# Patient Record
Sex: Male | Born: 1945
Health system: Southern US, Community
[De-identification: ages and names within clinical notes are randomized; demographics above are authoritative.]

## PROBLEM LIST (undated history)

## (undated) DIAGNOSIS — I10 Essential (primary) hypertension: Secondary | ICD-10-CM

## (undated) DIAGNOSIS — R7309 Other abnormal glucose: Secondary | ICD-10-CM

## (undated) DIAGNOSIS — K859 Acute pancreatitis without necrosis or infection, unspecified: Secondary | ICD-10-CM

## (undated) DIAGNOSIS — E785 Hyperlipidemia, unspecified: Secondary | ICD-10-CM

## (undated) DIAGNOSIS — R011 Cardiac murmur, unspecified: Secondary | ICD-10-CM

## (undated) DIAGNOSIS — J449 Chronic obstructive pulmonary disease, unspecified: Secondary | ICD-10-CM

## (undated) DIAGNOSIS — C3490 Malignant neoplasm of unspecified part of unspecified bronchus or lung: Secondary | ICD-10-CM

## (undated) DIAGNOSIS — N486 Induration penis plastica: Secondary | ICD-10-CM

## (undated) DIAGNOSIS — C801 Malignant (primary) neoplasm, unspecified: Secondary | ICD-10-CM

## (undated) DIAGNOSIS — I509 Heart failure, unspecified: Secondary | ICD-10-CM

## (undated) DIAGNOSIS — E230 Hypopituitarism: Secondary | ICD-10-CM

## (undated) DIAGNOSIS — B029 Zoster without complications: Secondary | ICD-10-CM

## (undated) DIAGNOSIS — M8580 Other specified disorders of bone density and structure, unspecified site: Secondary | ICD-10-CM

## (undated) DIAGNOSIS — I2510 Atherosclerotic heart disease of native coronary artery without angina pectoris: Secondary | ICD-10-CM

## (undated) DIAGNOSIS — K219 Gastro-esophageal reflux disease without esophagitis: Secondary | ICD-10-CM

## (undated) DIAGNOSIS — M1990 Unspecified osteoarthritis, unspecified site: Secondary | ICD-10-CM

## (undated) DIAGNOSIS — Z923 Personal history of irradiation: Secondary | ICD-10-CM

## (undated) DIAGNOSIS — E349 Endocrine disorder, unspecified: Secondary | ICD-10-CM

## (undated) HISTORY — DX: Cardiac murmur, unspecified: R01.1

## (undated) HISTORY — DX: Heart failure, unspecified: I50.9

## (undated) HISTORY — DX: Endocrine disorder, unspecified: E34.9

## (undated) HISTORY — DX: Hyperlipidemia, unspecified: E78.5

## (undated) HISTORY — DX: Malignant (primary) neoplasm, unspecified: C80.1

## (undated) HISTORY — PX: PORTA CATH INSERTION: CATH118285

## (undated) HISTORY — DX: Other abnormal glucose: R73.09

## (undated) HISTORY — PX: VASECTOMY: SHX75

## (undated) HISTORY — DX: Unspecified osteoarthritis, unspecified site: M19.90

## (undated) HISTORY — DX: Atherosclerotic heart disease of native coronary artery without angina pectoris: I25.10

## (undated) HISTORY — DX: Hypopituitarism: E23.0

## (undated) HISTORY — PX: KNEE SURGERY: SHX244

## (undated) HISTORY — DX: Gastro-esophageal reflux disease without esophagitis: K21.9

## (undated) HISTORY — DX: Malignant neoplasm of unspecified part of unspecified bronchus or lung: C34.90

## (undated) HISTORY — DX: Induration penis plastica: N48.6

## (undated) HISTORY — DX: Essential (primary) hypertension: I10

## (undated) HISTORY — DX: Other specified disorders of bone density and structure, unspecified site: M85.80

## (undated) HISTORY — DX: Acute pancreatitis without necrosis or infection, unspecified: K85.90

## (undated) HISTORY — DX: Chronic obstructive pulmonary disease, unspecified: J44.9

---

## 1965-01-07 HISTORY — PX: OTHER SURGICAL HISTORY: SHX169

## 1967-01-08 HISTORY — PX: OTHER SURGICAL HISTORY: SHX169

## 1982-01-07 HISTORY — PX: HERNIA REPAIR: SHX51

## 1992-01-08 HISTORY — PX: SHOULDER SURGERY: SHX246

## 1998-08-16 ENCOUNTER — Encounter: Payer: Self-pay | Admitting: Emergency Medicine

## 1998-08-16 ENCOUNTER — Inpatient Hospital Stay (HOSPITAL_COMMUNITY): Admission: EM | Admit: 1998-08-16 | Discharge: 1998-08-22 | Payer: Self-pay | Admitting: Emergency Medicine

## 1998-08-17 ENCOUNTER — Encounter: Payer: Self-pay | Admitting: Cardiology

## 1998-08-18 ENCOUNTER — Encounter: Payer: Self-pay | Admitting: Thoracic Surgery (Cardiothoracic Vascular Surgery)

## 1998-08-19 ENCOUNTER — Encounter: Payer: Self-pay | Admitting: Thoracic Surgery (Cardiothoracic Vascular Surgery)

## 1998-08-20 ENCOUNTER — Encounter: Payer: Self-pay | Admitting: Thoracic Surgery (Cardiothoracic Vascular Surgery)

## 1998-08-21 ENCOUNTER — Encounter: Payer: Self-pay | Admitting: Thoracic Surgery (Cardiothoracic Vascular Surgery)

## 1998-08-22 ENCOUNTER — Encounter: Payer: Self-pay | Admitting: Thoracic Surgery (Cardiothoracic Vascular Surgery)

## 1999-01-08 HISTORY — PX: CORONARY ARTERY BYPASS GRAFT: SHX141

## 2002-06-18 ENCOUNTER — Emergency Department (HOSPITAL_COMMUNITY): Admission: EM | Admit: 2002-06-18 | Discharge: 2002-06-19 | Payer: Self-pay | Admitting: *Deleted

## 2002-06-19 ENCOUNTER — Encounter: Payer: Self-pay | Admitting: *Deleted

## 2003-04-15 ENCOUNTER — Encounter: Payer: Self-pay | Admitting: Internal Medicine

## 2004-01-08 HISTORY — PX: OTHER SURGICAL HISTORY: SHX169

## 2004-02-13 ENCOUNTER — Ambulatory Visit (HOSPITAL_COMMUNITY): Admission: RE | Admit: 2004-02-13 | Discharge: 2004-02-13 | Payer: Self-pay | Admitting: Podiatry

## 2004-04-27 ENCOUNTER — Ambulatory Visit: Payer: Self-pay | Admitting: Internal Medicine

## 2005-01-07 HISTORY — PX: COLONOSCOPY: SHX174

## 2005-01-07 HISTORY — PX: MENISECTOMY: SHX5181

## 2005-01-07 HISTORY — PX: UPPER GASTROINTESTINAL ENDOSCOPY: SHX188

## 2005-05-03 ENCOUNTER — Ambulatory Visit: Payer: Self-pay | Admitting: Internal Medicine

## 2005-06-06 ENCOUNTER — Ambulatory Visit: Payer: Self-pay | Admitting: Internal Medicine

## 2005-07-04 ENCOUNTER — Ambulatory Visit: Payer: Self-pay | Admitting: Cardiology

## 2005-07-12 ENCOUNTER — Ambulatory Visit: Payer: Self-pay | Admitting: Gastroenterology

## 2005-07-29 ENCOUNTER — Encounter: Payer: Self-pay | Admitting: Gastroenterology

## 2005-07-29 ENCOUNTER — Ambulatory Visit: Payer: Self-pay | Admitting: Gastroenterology

## 2006-05-05 ENCOUNTER — Ambulatory Visit: Payer: Self-pay | Admitting: Internal Medicine

## 2006-05-05 LAB — CONVERTED CEMR LAB
ALT: 33 units/L (ref 0–40)
AST: 36 units/L (ref 0–37)
Albumin: 3.8 g/dL (ref 3.5–5.2)
Alkaline Phosphatase: 64 units/L (ref 39–117)
BUN: 10 mg/dL (ref 6–23)
Basophils Absolute: 0 10*3/uL (ref 0.0–0.1)
Basophils Relative: 0.5 % (ref 0.0–1.0)
Bilirubin, Direct: 0.1 mg/dL (ref 0.0–0.3)
CO2: 31 meq/L (ref 19–32)
Calcium: 8.8 mg/dL (ref 8.4–10.5)
Chloride: 108 meq/L (ref 96–112)
Cholesterol: 179 mg/dL (ref 0–200)
Creatinine, Ser: 0.9 mg/dL (ref 0.4–1.5)
Eosinophils Absolute: 0.1 10*3/uL (ref 0.0–0.6)
Eosinophils Relative: 2 % (ref 0.0–5.0)
GFR calc Af Amer: 111 mL/min
GFR calc non Af Amer: 91 mL/min
Glucose, Bld: 107 mg/dL — ABNORMAL HIGH (ref 70–99)
HCT: 50.3 % (ref 39.0–52.0)
HDL: 36.8 mg/dL — ABNORMAL LOW (ref >39.0)
Hemoglobin: 17.3 g/dL — ABNORMAL HIGH (ref 13.0–17.0)
Hgb A1c MFr Bld: 5.7 % (ref 4.6–6.0)
LDL Cholesterol: 129 mg/dL — ABNORMAL HIGH (ref 0–99)
Lymphocytes Relative: 33.4 % (ref 12.0–46.0)
MCHC: 34.4 g/dL (ref 30.0–36.0)
MCV: 96.4 fL (ref 78.0–100.0)
Monocytes Absolute: 0.6 10*3/uL (ref 0.2–0.7)
Monocytes Relative: 8.6 % (ref 3.0–11.0)
Neutro Abs: 3.6 10*3/uL (ref 1.4–7.7)
Neutrophils Relative %: 55.5 % (ref 43.0–77.0)
PSA: 1.4 ng/mL (ref 0.10–4.00)
Platelets: 179 10*3/uL (ref 150–400)
Potassium: 4.4 meq/L (ref 3.5–5.1)
RBC: 5.22 M/uL (ref 4.22–5.81)
RDW: 12.9 % (ref 11.5–14.6)
Sodium: 141 meq/L (ref 135–145)
TSH: 1.59 microintl units/mL (ref 0.35–5.50)
Total Bilirubin: 0.7 mg/dL (ref 0.3–1.2)
Total CHOL/HDL Ratio: 4.9
Total Protein: 6.7 g/dL (ref 6.0–8.3)
Triglycerides: 64 mg/dL (ref 0–149)
VLDL: 13 mg/dL (ref 0–40)
WBC: 6.4 10*3/uL (ref 4.5–10.5)

## 2006-11-11 ENCOUNTER — Encounter: Payer: Self-pay | Admitting: Internal Medicine

## 2007-01-08 ENCOUNTER — Emergency Department (HOSPITAL_COMMUNITY): Admission: EM | Admit: 2007-01-08 | Discharge: 2007-01-08 | Payer: Self-pay | Admitting: Emergency Medicine

## 2007-07-28 DIAGNOSIS — R030 Elevated blood-pressure reading, without diagnosis of hypertension: Secondary | ICD-10-CM | POA: Insufficient documentation

## 2007-08-05 ENCOUNTER — Ambulatory Visit: Payer: Self-pay | Admitting: Internal Medicine

## 2007-08-05 ENCOUNTER — Encounter (INDEPENDENT_AMBULATORY_CARE_PROVIDER_SITE_OTHER): Payer: Self-pay | Admitting: *Deleted

## 2007-08-05 DIAGNOSIS — N400 Enlarged prostate without lower urinary tract symptoms: Secondary | ICD-10-CM | POA: Insufficient documentation

## 2007-08-05 DIAGNOSIS — E349 Endocrine disorder, unspecified: Secondary | ICD-10-CM | POA: Insufficient documentation

## 2007-08-05 DIAGNOSIS — K219 Gastro-esophageal reflux disease without esophagitis: Secondary | ICD-10-CM | POA: Insufficient documentation

## 2007-08-05 DIAGNOSIS — E230 Hypopituitarism: Secondary | ICD-10-CM | POA: Insufficient documentation

## 2007-08-05 DIAGNOSIS — I2510 Atherosclerotic heart disease of native coronary artery without angina pectoris: Secondary | ICD-10-CM | POA: Insufficient documentation

## 2007-08-05 LAB — CONVERTED CEMR LAB
Bilirubin Urine: NEGATIVE
Blood in Urine, dipstick: NEGATIVE
Glucose, Urine, Semiquant: NEGATIVE
Ketones, urine, test strip: NEGATIVE
Specific Gravity, Urine: 1.01
pH: 7

## 2007-08-10 ENCOUNTER — Telehealth (INDEPENDENT_AMBULATORY_CARE_PROVIDER_SITE_OTHER): Payer: Self-pay | Admitting: *Deleted

## 2007-08-10 ENCOUNTER — Encounter (INDEPENDENT_AMBULATORY_CARE_PROVIDER_SITE_OTHER): Payer: Self-pay | Admitting: *Deleted

## 2007-08-10 LAB — CONVERTED CEMR LAB
Albumin: 3.9 g/dL (ref 3.5–5.2)
Bilirubin, Direct: 0.1 mg/dL (ref 0.0–0.3)
Calcium: 9.1 mg/dL (ref 8.4–10.5)
Cholesterol: 183 mg/dL (ref 0–200)
Eosinophils Absolute: 0.1 10*3/uL (ref 0.0–0.7)
GFR calc Af Amer: 110 mL/min
GFR calc non Af Amer: 91 mL/min
Glucose, Bld: 100 mg/dL — ABNORMAL HIGH (ref 70–99)
HCT: 51.2 % (ref 39.0–52.0)
HDL: 36.3 mg/dL — ABNORMAL LOW (ref >39.0)
Hemoglobin: 18 g/dL — ABNORMAL HIGH (ref 13.0–17.0)
MCHC: 35.1 g/dL (ref 30.0–36.0)
MCV: 95.4 fL (ref 78.0–100.0)
Monocytes Absolute: 0.7 10*3/uL (ref 0.1–1.0)
Monocytes Relative: 9.8 % (ref 3.0–12.0)
Neutro Abs: 4.5 10*3/uL (ref 1.4–7.7)
PSA: 1.26 ng/mL (ref 0.10–4.00)
RDW: 13.3 % (ref 11.5–14.6)
Sodium: 137 meq/L (ref 135–145)
TSH: 1.47 microintl units/mL (ref 0.35–5.50)
Total CHOL/HDL Ratio: 5
Total Protein: 7.1 g/dL (ref 6.0–8.3)
Triglycerides: 72 mg/dL (ref 0–149)

## 2007-08-11 ENCOUNTER — Ambulatory Visit: Payer: Self-pay | Admitting: Internal Medicine

## 2007-08-11 ENCOUNTER — Ambulatory Visit: Payer: Self-pay

## 2007-08-11 ENCOUNTER — Encounter (INDEPENDENT_AMBULATORY_CARE_PROVIDER_SITE_OTHER): Payer: Self-pay | Admitting: *Deleted

## 2007-08-12 ENCOUNTER — Ambulatory Visit: Payer: Self-pay | Admitting: Internal Medicine

## 2007-08-12 DIAGNOSIS — E785 Hyperlipidemia, unspecified: Secondary | ICD-10-CM | POA: Insufficient documentation

## 2007-08-12 LAB — CONVERTED CEMR LAB: HDL goal, serum: 40 mg/dL

## 2007-08-14 ENCOUNTER — Encounter (INDEPENDENT_AMBULATORY_CARE_PROVIDER_SITE_OTHER): Payer: Self-pay | Admitting: *Deleted

## 2007-09-04 ENCOUNTER — Encounter: Payer: Self-pay | Admitting: Internal Medicine

## 2007-09-30 ENCOUNTER — Encounter: Payer: Self-pay | Admitting: Internal Medicine

## 2007-10-14 ENCOUNTER — Encounter (INDEPENDENT_AMBULATORY_CARE_PROVIDER_SITE_OTHER): Payer: Self-pay | Admitting: *Deleted

## 2008-02-10 ENCOUNTER — Ambulatory Visit: Payer: Self-pay | Admitting: Internal Medicine

## 2008-02-10 DIAGNOSIS — D582 Other hemoglobinopathies: Secondary | ICD-10-CM | POA: Insufficient documentation

## 2008-02-11 ENCOUNTER — Encounter: Payer: Self-pay | Admitting: Internal Medicine

## 2008-02-11 LAB — CONVERTED CEMR LAB
Albumin: 4 g/dL (ref 3.5–5.2)
Alkaline Phosphatase: 60 units/L (ref 39–117)
Basophils Absolute: 0 10*3/uL (ref 0.0–0.1)
Bilirubin, Direct: 0.1 mg/dL (ref 0.0–0.3)
Cholesterol: 172 mg/dL (ref 0–200)
Eosinophils Absolute: 0.1 10*3/uL (ref 0.0–0.7)
HDL: 32.6 mg/dL — ABNORMAL LOW (ref >39.0)
MCHC: 34.2 g/dL (ref 30.0–36.0)
MCV: 97.1 fL (ref 78.0–100.0)
Neutrophils Relative %: 61 % (ref 43.0–77.0)
Platelets: 168 10*3/uL (ref 150–400)
RDW: 13 % (ref 11.5–14.6)
Saturation Ratios: 52.7 % — ABNORMAL HIGH (ref 20.0–50.0)
Total CK: 127 units/L (ref 7–195)
VLDL: 20 mg/dL (ref 0–40)

## 2008-08-05 ENCOUNTER — Ambulatory Visit: Payer: Self-pay | Admitting: Internal Medicine

## 2008-08-12 ENCOUNTER — Encounter (INDEPENDENT_AMBULATORY_CARE_PROVIDER_SITE_OTHER): Payer: Self-pay | Admitting: *Deleted

## 2008-08-12 LAB — CONVERTED CEMR LAB
Albumin: 4.1 g/dL (ref 3.5–5.2)
Alkaline Phosphatase: 66 units/L (ref 39–117)
Basophils Absolute: 0 10*3/uL (ref 0.0–0.1)
CO2: 29 meq/L (ref 19–32)
Calcium: 9.2 mg/dL (ref 8.4–10.5)
Eosinophils Absolute: 0.1 10*3/uL (ref 0.0–0.7)
Glucose, Bld: 97 mg/dL (ref 70–99)
HDL: 39 mg/dL — ABNORMAL LOW (ref >39.00)
Hemoglobin: 17.9 g/dL — ABNORMAL HIGH (ref 13.0–17.0)
Lymphocytes Relative: 30.6 % (ref 12.0–46.0)
Lymphs Abs: 1.8 10*3/uL (ref 0.7–4.0)
MCHC: 34.3 g/dL (ref 30.0–36.0)
Monocytes Absolute: 0.6 10*3/uL (ref 0.1–1.0)
Neutro Abs: 3.3 10*3/uL (ref 1.4–7.7)
PSA: 1.32 ng/mL (ref 0.10–4.00)
RDW: 12.9 % (ref 11.5–14.6)
Sodium: 142 meq/L (ref 135–145)
TSH: 1.24 microintl units/mL (ref 0.35–5.50)
Triglycerides: 69 mg/dL (ref 0.0–149.0)

## 2008-10-26 ENCOUNTER — Encounter: Payer: Self-pay | Admitting: Internal Medicine

## 2009-01-26 ENCOUNTER — Telehealth (INDEPENDENT_AMBULATORY_CARE_PROVIDER_SITE_OTHER): Payer: Self-pay | Admitting: *Deleted

## 2009-07-31 ENCOUNTER — Ambulatory Visit: Payer: Self-pay | Admitting: Internal Medicine

## 2009-07-31 LAB — CONVERTED CEMR LAB
ALT: 18 units/L (ref 0–53)
Alkaline Phosphatase: 63 units/L (ref 39–117)
BUN: 11 mg/dL (ref 6–23)
Basophils Absolute: 0 10*3/uL (ref 0.0–0.1)
Bilirubin Urine: NEGATIVE
Bilirubin, Direct: 0.2 mg/dL (ref 0.0–0.3)
Calcium: 8.7 mg/dL (ref 8.4–10.5)
Cholesterol: 130 mg/dL (ref 0–200)
Eosinophils Absolute: 0.1 10*3/uL (ref 0.0–0.7)
GFR calc non Af Amer: 104.94 mL/min (ref >60)
Glucose, Bld: 90 mg/dL (ref 70–99)
Glucose, Urine, Semiquant: NEGATIVE
Hemoglobin: 17.6 g/dL — ABNORMAL HIGH (ref 13.0–17.0)
Lymphocytes Relative: 26.3 % (ref 12.0–46.0)
MCHC: 34.9 g/dL (ref 30.0–36.0)
MCV: 99.1 fL (ref 78.0–100.0)
Monocytes Absolute: 0.6 10*3/uL (ref 0.1–1.0)
Neutro Abs: 4.2 10*3/uL (ref 1.4–7.7)
Neutrophils Relative %: 63.1 % (ref 43.0–77.0)
Potassium: 4.2 meq/L (ref 3.5–5.1)
RDW: 14.4 % (ref 11.5–14.6)
Total Bilirubin: 0.6 mg/dL (ref 0.3–1.2)
Total Protein: 6.5 g/dL (ref 6.0–8.3)
pH: 8

## 2009-08-07 ENCOUNTER — Ambulatory Visit: Payer: Self-pay | Admitting: Internal Medicine

## 2009-08-07 DIAGNOSIS — M8580 Other specified disorders of bone density and structure, unspecified site: Secondary | ICD-10-CM | POA: Insufficient documentation

## 2009-08-07 LAB — CONVERTED CEMR LAB: OCCULT 1: NEGATIVE

## 2009-08-08 ENCOUNTER — Encounter (INDEPENDENT_AMBULATORY_CARE_PROVIDER_SITE_OTHER): Payer: Self-pay | Admitting: *Deleted

## 2009-09-12 ENCOUNTER — Encounter: Payer: Self-pay | Admitting: Internal Medicine

## 2009-09-14 ENCOUNTER — Encounter: Payer: Self-pay | Admitting: Internal Medicine

## 2010-02-06 NOTE — Assessment & Plan Note (Signed)
Summary: cpx/ns/kdc   Vital Signs:  Patient profile:   65 year old male Height:      72 inches Weight:      192.6 pounds BMI:     26.22 Temp:     97.9 degrees F oral Pulse rate:   60 / minute Resp:     16 per minute BP sitting:   132 / 80  (left arm) Cuff size:   large  Vitals Entered By: Shonna Chock CMA (August 07, 2009 10:59 AM)    History of Present Illness: Patrick Harris is here for a physical; he is asymptomatic except for some muscle cramps in context of sweating during the recent  heat wave.  He had Life Screen testing done 06/01/2009; those results were reviewed . His heel T score was -1.4.  Current Medications (verified): 1)  Amlodipine Besylate 5 Mg  Tabs (Amlodipine Besylate) .... Take One Tablet Daily. 2)  Niaspan 750 Mg Cr-Tabs (Niacin (Antihyperlipidemic)) .... 2 By Mouth Once Daily 3)  Flomax 0.4 Mg  Xr24h-Cap (Tamsulosin Hcl) .Marland Kitchen.. 1 By Mouth Once Daily 4)  Androgel 50 Mg/5gm  Gel (Testosterone) .... 1/2 Daily 5)  Bromocriptine Mesylate 5 Mg  Caps (Bromocriptine Mesylate) .Marland Kitchen.. 1 By Mouth Once Daily 6)  Aspirin Buffered 325 Mg  Tabs (Aspirin Buff(Mgcarb-Alaminoac)) .Marland Kitchen.. 1 By Mouth Once Daily 7)  Coq10 100 Mg  Caps (Coenzyme Q10) .Marland Kitchen.. 1 By Mouth Once Daily 8)  Flax Seed Oil 1000 Mg  Caps (Flaxseed (Linseed)) .Marland Kitchen.. 1 By Mouth Once Daily 9)  Vitamin D3 1000 Unit  Tabs (Cholecalciferol) .Marland Kitchen.. 1 By Mouth Once Daily 10)  Crestor 20 Mg Tabs (Rosuvastatin Calcium) .... Daily As Directed 11)  Prilosec Otc 20 Mg Tbec (Omeprazole Magnesium) .Marland Kitchen.. 1 By Mouth Once Daily 12)  Potassium 99 Mg Tabs (Potassium) .Marland Kitchen.. 1 By Mouth Once Daily 13)  Turmeric Curcumin  Caps (Misc Natural Products) .... 2 By Mouth Once Daily 14)  L-Arginine 500 Mg Tabs (Arginine) .Marland Kitchen.. 1 By Mouth Once Daily  Allergies: 1)  ! Zocor (Simvastatin) 2)  ! * Latex  Past History:  Past Medical History: Hypertension 06/22/99-Hypopituitary gland  hypofunction , Dr Adela Lank Testoterone deficiency ;  Peyronie's Hyperlipidemia Elevated Hgb (on testosterone replacement) GERD Osteopenia: T score -1.4 @ heel 2011  Past Surgical History: age 49-fractured calcaneus  bilaterally with ankle fusion Herniorrhaphy 1984 Left shoulder surgery 1981 2001-bypass surgery 2 vessels Negative colonoscopy 2002 & 2007 2006-bone spur of 5th toes 2007- R  medial menisectomy, Dr Ophelia Charter 2007 Endoscopy:hiatal hernia, Dr Jarold Motto  Family History: Father: MI @ 84 Mother: DM,CVA,HTN Siblings: sister: leukemia;bro :AAA ; bro: Peyronie's; P uncles: ? colon cancer; P 1st cousins: colon cancer  Social History: no diet Retired Former Smoker: quit 2001 Alcohol use-no Regular exercise: physically active  & UE exercises  Review of Systems  The patient denies anorexia, fever, vision loss, decreased hearing, hoarseness, chest pain, syncope, dyspnea on exertion, prolonged cough, headaches, hemoptysis, abdominal pain, melena, hematochezia, severe indigestion/heartburn, hematuria, incontinence, suspicious skin lesions, depression, unusual weight change, abnormal bleeding, enlarged lymph nodes, and angioedema.         Weight up 6-8 #. Ankle edema with prolonged driving.Isolated , single episode of  R temporal headache with sharp pain for seconds after bending over in  shower; no recurrence of significance(see FOV & Neuro exam).  Physical Exam  General:  well-nourished,in no acute distress; alert,appropriate and cooperative throughout examination Head:  Normocephalic and atraumatic without obvious abnormalities. No apparent  alopecia  Eyes:  No corneal or conjunctival inflammation noted. EOMI. Perrla. Funduscopic exam benign, without hemorrhages, exudates or papilledema. Field of Vision grossly normal. Ears:  External ear exam shows no significant lesions or deformities.  Otoscopic examination reveals clear canals, tympanic membranes are intact bilaterally without bulging, retraction, inflammation or discharge. Hearing  is grossly normal bilaterally. Nose:  External nasal examination shows no deformity or inflammation. Nasal mucosa are pink and moist without lesions or exudates. Mouth:  Oral mucosa and oropharynx without lesions or exudates.  Upper plate Neck:  No deformities, masses, or tenderness noted. Lungs:  Normal respiratory effort, chest expands symmetrically. Lungs are clear to auscultation, no crackles or wheezes. Heart:  Normal rate and regular rhythm. S1  slightly increased.No  gallop, murmur, click, rub or other extra sounds. Abdomen:  Bowel sounds positive,abdomen soft and non-tender without masses, organomegaly or hernias noted. No AAA Rectal:  No external abnormalities noted. Normal sphincter tone. No rectal masses or tenderness. Genitalia:  Testes bilaterally descended without nodularity, tenderness or masses. No scrotal masses or lesions. No penis lesions or urethral discharge. L varicocele.   Prostate:  no nodules, no induration, and R  mild asymmetrical enlargement.   Msk:  No deformity or scoliosis noted of thoracic or lumbar spine.   Pulses:  R and L carotid,radial,dorsalis pedis and posterior tibial pulses are full and equal bilaterally Extremities:  No clubbing, cyanosis, edema, or deformity noted with normal full range of motion of all joints.   Neurologic:  +alert & oriented X3, cranial nerves II-XII intact, strength normal in all extremities, gait normal, and DTRs symmetrical and  0-1/2+ Skin:  Intact without suspicious lesions or rashes Cervical Nodes:  No lymphadenopathy noted Axillary Nodes:  No palpable lymphadenopathy Inguinal Nodes:  No significant adenopathy Psych:  memory intact for recent and remote, normally interactive, good eye contact, and not anxious appearing.     Impression & Recommendations:  Problem # 1:  ROUTINE GENERAL MEDICAL EXAM@HEALTH  CARE FACL (ICD-V70.0)  Problem # 2:  OSTEOPENIA (ICD-733.90)  Problem # 3:  HYPERLIPIDEMIA (ICD-272.4)  The following  medications were removed from the medication list:    Niacin 500 Mg Tabs (Niacin) .Marland Kitchen... Tking 3 cap once daily His updated medication list for this problem includes:    Niaspan 750 Mg Cr-tabs (Niacin (antihyperlipidemic)) .Marland Kitchen... 2 by mouth once daily    Crestor 20 Mg Tabs (Rosuvastatin calcium) .Marland Kitchen... Daily as directed  Problem # 4:  HYPERPLASIA PROSTATE UNS W/O UR OBST & OTH LUTS (ICD-600.90)  His updated medication list for this problem includes:    Flomax 0.4 Mg Xr24h-cap (Tamsulosin hcl) .Marland Kitchen... 1 by mouth once daily  Problem # 5:  HYPERTENSION (ICD-401.9)  His updated medication list for this problem includes:    Amlodipine Besylate 5 Mg Tabs (Amlodipine besylate) .Marland Kitchen... Take one tablet daily.  Problem # 6:  PITUITARY INSUFFICIENCY (ICD-253.2) as per Dr Juleen China  Problem # 7:  TESTOSTERONE DEFICIENCY (ICD-257.2) as per Dr Juleen China  Complete Medication List: 1)  Amlodipine Besylate 5 Mg Tabs (Amlodipine besylate) .... Take one tablet daily. 2)  Niaspan 750 Mg Cr-tabs (Niacin (antihyperlipidemic)) .... 2 by mouth once daily 3)  Flomax 0.4 Mg Xr24h-cap (Tamsulosin hcl) .Marland Kitchen.. 1 by mouth once daily 4)  Androgel 50 Mg/5gm Gel (Testosterone) .... 1/2 daily 5)  Bromocriptine Mesylate 5 Mg Caps (Bromocriptine mesylate) .Marland Kitchen.. 1 by mouth once daily 6)  Aspirin Buffered 325 Mg Tabs (Aspirin buff(mgcarb-alaminoac)) .Marland Kitchen.. 1 by mouth once daily 7)  Coq10 100  Mg Caps (Coenzyme q10) .Marland Kitchen.. 1 by mouth once daily 8)  Flax Seed Oil 1000 Mg Caps (Flaxseed (linseed)) .Marland Kitchen.. 1 by mouth once daily 9)  Vitamin D3 1000 Unit Tabs (Cholecalciferol) .Marland Kitchen.. 1 by mouth once daily 10)  Crestor 20 Mg Tabs (Rosuvastatin calcium) .... Daily as directed 11)  Prilosec Otc 20 Mg Tbec (Omeprazole magnesium) .Marland Kitchen.. 1 by mouth once daily 12)  Potassium 99 Mg Tabs (Potassium) .Marland Kitchen.. 1 by mouth once daily 13)  Turmeric Curcumin Caps (Misc natural products) .... 2 by mouth once daily 14)  L-arginine 500 Mg Tabs (Arginine) .Marland Kitchen.. 1 by mouth once  daily  Patient Instructions: 1)  Verify when last BMD  done ; it is due every 25 months. Verify that Dr Juleen China checks the PSA due to Testosterone replacement. Prescriptions: CRESTOR 20 MG TABS (ROSUVASTATIN CALCIUM) daily as directed  #30 x 11   Entered and Authorized by:   Marga Melnick MD   Signed by:   Marga Melnick MD on 08/07/2009   Method used:   Print then Give to Patient   RxID:   [id number] AMLODIPINE BESYLATE 5 MG  TABS (AMLODIPINE BESYLATE) Take one tablet daily.  #90 x 3   Entered and Authorized by:   Marga Melnick MD   Signed by:   Marga Melnick MD on 08/07/2009   Method used:   Print then Give to Patient   RxID:   [id number] FLOMAX 0.4 MG  XR24H-CAP (TAMSULOSIN HCL) 1 by mouth once daily  #90 x 3   Entered and Authorized by:   Marga Melnick MD   Signed by:   Marga Melnick MD on 08/07/2009   Method used:   Print then Give to Patient   RxID:   [id number]

## 2010-02-06 NOTE — Miscellaneous (Signed)
Summary: [facility]  [facility]   Imported By: Lanelle Bal 09/27/2009 08:28:51  _____________________________________________________________________  External Attachment:    Type:   Image     Comment:   External Document

## 2010-02-06 NOTE — Progress Notes (Signed)
Summary: cpx labs  Phone Note Call from Patient   Caller: Patient Summary of Call: patient has a cpx scheduled on aug 1,2011 and labs scheduled on july 25,2011. need lab orders please. Initial call taken by: Barb Merino,  January 26, 2009 12:06 PM  Follow-up for Phone Call        401.9/272.4/995.20/v70.0 lipid hepatic cbcd tsh psa bmp udip stool cards Follow-up by: Shonna Chock,  January 26, 2009 1:43 PM    Additional Follow-up for Phone Call Additional follow up Details #2::    labs are scheduled.Marland KitchenMarland KitchenMarland KitchenBarb Merino  January 26, 2009 2:19 PM  Follow-up by: Barb Merino,  January 26, 2009 2:19 PM

## 2010-02-06 NOTE — Letter (Signed)
Summary: Results Follow up Letter  [facility]  [address] [place], [place] [address]   Phone: [phone]  Fax: [phone]    08/08/2009 MRN: [id number]  [facility] [address] [place], [place]  [address]  Dear Mr. MENO,  The following are the results of your recent test(s):  Test         Result    Pap Smear:        Normal _____  Not Normal _____ Comments: ______________________________________________________ Cholesterol: LDL(Bad cholesterol):         Your goal is less than:         HDL (Good cholesterol):       Your goal is more than: Comments:  ______________________________________________________ Mammogram:        Normal _____  Not Normal _____ Comments:  ___________________________________________________________________ Hemoccult:        Normal __X___  Not normal _______ Comments:    _____________________________________________________________________ Other Tests:    We routinely do not discuss normal results over the telephone.  If you desire a copy of the results, or you have any questions about this information we can discuss them at your next office visit.   Sincerely,

## 2010-05-25 NOTE — H&P (Signed)
NAME:  Patrick Harris, Patrick Harris             ACCOUNT NO.:  [id number]   MEDICAL RECORD NO.:  [id number]           PATIENT TYPE:   LOCATION:                                 FACILITY:   PHYSICIAN:  Oley Balm. Pricilla Holm, D.P.M.DATE OF BIRTH:  October 29, 1945   DATE OF ADMISSION:  02/13/2004  DATE OF DISCHARGE:  LH                                HISTORY & PHYSICAL   HISTORY OF PRESENT ILLNESS:  Mr. Rudin is a 65 year old male who presents  to the office with a chief complaint of pain fifth toes.  The patient has  basically abductor varus rotation both fifth toes with a hyperostosis.  The  patient relates that it has gotten so painful that he finds it difficult to  wearing closed shoes, and he requests surgical correction of same.   PAST SURGICAL HISTORY:  1.  Heart bypass surgery.  2.  Shoulder surgery.  3.  Previous foot surgery.   MEDICATIONS:  1.  Norvasc.  2.  Fish oil.  3.  __________ .  4.  AndroGel.  5.  Ecotrin.  6.  __________ .   ALLERGIES:  LATEX GLOVES.   No history of transfusions or hepatitis.   REVIEW OF SYSTEMS:  History of arthritis, hypertension.   PHYSICAL EXAMINATION:  LOWER EXTREMITIES:  Palpable pedal pulses both DP and  PT with spontaneous capillary filling time.  NEUROLOGIC:  Essentially within normal limits.  MUSCULOSKELETAL:  Reveals abductor-varus rotation, fifth toes of both feet,  with noted interdigital lesions.   PLAN:  The patient is to undergo arthroplasty _exostectomy_ fifth digits  bilaterally.  I reviewed the procedure with the patient, including  complications of the procedure such as infection, bone infection,  postoperative pain and swelling, etc.  The patient seems to understand the  same, and again surgery has been scheduled for February 13, 2004.      DBT/MEDQ  D:  02/12/2004  T:  02/12/2004  Job:  [id number]

## 2010-05-25 NOTE — Assessment & Plan Note (Signed)
[facility]                        [place] OFFICE NOTE   NAME:Patrick Harris, Patrick Harris                    MRN:          [id number]  DATE:05/05/2006                            DOB:          05/11/1945    Patrick Harris, date of birth [birth year], was seen for a  comprehensive physical examination May 05, 2006.   Active symptoms include some arthralgias; the symptoms were exacerbated  after 4 weeks of taking simvastatin prescribed by Dr. Dietrich Pates.  Dr.  Dietrich Pates had seen Joe for preoperative clearance prior to his knee  surgery in July 2007.  Because of symptoms he stopped the simvastatin  and restarted Niaspan.  He is presently on Niaspan 500 mg three daily.   PAST MEDICAL HISTORY:  Is long and complicated.  At age 65 he fractured  both feet and required ankle fusions following a fall off of a scaffold.  He has had a herniorrhaphy, left shoulder surgery, and a medial  meniscectomy.  Additionally, he has had colonoscopy which was negative  in 2007 and endoscopy which revealed hiatal hernia and esophageal  reflux.  He had 2 vessel bypass in 2001.  He has a history of benign  pituitary tumor which is associated with testosterone deficiency and for  which he is followed by Dr. Juleen China, Endocrinologist.   There is a family history of cancer of the colon in paternal uncles and  in paternal first cousins.  One also had breast cancer.  His mother had  late onset diabetes, stroke, and hypertension.  Father had myocardial  infarction at 65.  One brother had Peyronie's, another brother had  aortic aneurysm.  One sister had acute leukemia and one sister died in  an accident.   Joe has not smoked since 2001, does not drink.   HE IS INTOLERANT OR ALLERGIC TO LATEX AND AS NOTED, SIMVASTATIN.   Presently he is on:  1. Ecotrin 325 mg daily.  2. Amlodipine 5 mg daily.  3. Coenzyme Q10 thirty mg daily.  4. AndroGel 1% percent 2.5 grams.   5..  Saw  palmetto extract supplement.  1. Fish oil supplement.  2. In addition to the AndroGel Dr. Juleen China has him on Parlodel 5 mg      daily.  3. Was also on Prilosec 20 mg daily with good control of his      esophageal reflux.   REVIEW OF SYSTEMS:  Includes minor headache which he does not treat.  He  has no visual field cuts based on ophthalmologic evaluation .   Since the endoscopy Joe has described some increased gas.  He  specifically denies melena or dysphagia or other constitutional  symptoms.   He does have arthralgias as noted, rarely he will take an Advil.   His prolactin level was 9.1 within normal limits on March 24, 2006 at  Dr. Marylen Ponto office.   He had a decrease in his exercise due to his wife's recent diagnosis of  breast cancer.  He will walk 30 minutes 3 times a week w/o  cardiopulmonary symptoms.  He is on specific diet.  Significantly he  has  retired.   He is 6 feet and weighs 196, which is up 5 pounds.  Pulse was 60 and  regular, respiratory rate 12, blood pressure 110/80.  He has mild  arterial narrowing.  Extraocular motions intact.  No visual field  deficit is noted on examination. He has an upper dental plate.  There is  slight increased cerumen in the canals.  The remainder of  otolaryngologic exam is unremarkable.  There is no lymphadenopathy about  the head, neck, or axilla; thyroid is normal to palpation.  An S4 is  noted without murmurs.  CHEST:  Clear.  He has no organomegaly and there is no abdominal tenderness.  Bowel  sounds are normal.  He have mild crepitus of the knees.  Additionally there is some  asymmetry of the posterior thoracic muscle which probably explains back  pain he is having.  Deep tendon reflexes are 1-1/2 + and equal.  Strength is excellent in  all positions.  He has negative straight leg raising.  NEUROPSYCHIATRIC:  Unremarkable.  EKG reveals a sinus bradycardia with a rate of 45.  GENITOURINARY:  Unremarkable.  PROSTATE:  Upper  limits of normal without nodularity.  HEMOCCULT:  negative.   A goal sheet will be provided; fasting labs will be collected.  He has  been asked to share the results with Dr. Juleen China. In reviewing the chart  and based on an NMR profile, his LDL goal was less than 90.     Titus Dubin. Alwyn Ren, MD,FACP,FCCP  Electronically Signed    [facility]  DD: 05/05/2006  DT: 05/05/2006  Job #: [id number]

## 2010-05-25 NOTE — Op Note (Signed)
NAME:  Patrick Harris, Patrick Harris             ACCOUNT NO.:  [id number]   MEDICAL RECORD NO.:  [id number]          PATIENT TYPE:  AMB   LOCATION:  DAY                           FACILITY:  [facility]   PHYSICIAN:  Oley Balm. Pricilla Holm, D.P.M.DATE OF BIRTH:  Jul 19, 1945   DATE OF PROCEDURE:  02/13/2004  DATE OF DISCHARGE:                                 OPERATIVE REPORT   PREOPERATIVE DIAGNOSIS:  Abductovarus rotation, fifth toes, both feet, with  hyperostosis.   POSTOPERATIVE DIAGNOSIS:  Abductovarus rotation, fifth toes, both feet, with  hyperostosis.   PROCEDURES:  Arthroplasty, exostectomy, fifth toes, both feet.   SURGEON OF RECORD:  Oley Balm. Pricilla Holm, D.P.M.   ANESTHESIA:  Monitored anesthesia care.   INDICATIONS FOR SURGERY:  Long-standing history of pain not relieved by  conservative care.   DESCRIPTION OF PROCEDURE:  Patient brought into the operating room and  placed on the operating table in a supine position.  The patient's lower  feet and legs were then prepped and draped in the usual strict aseptic  manner.  Then with an ankle tourniquet placed, well-padded to elevate  contusion and elevated to 250 mmHg, exsanguination of each foot, the  following surgical procedures were then performed under monitored anesthesia  care.   ARTHROPLASTY, EXOSTECTOMY, FIFTH TOE, LEFT FOOT:  Attention was directed to  the medial aspect of the fifth toe, left foot, where a 2 cm elliptical  __________ skin incision was made.  The incision widened and deepened via  sharp and blunt dissection, being sure to identify and retract all vital  structures.  The distal end of the phalanx of the fifth toe was then exposed  and rasped smooth utilizing a Zimmer oscillating saw.  The wound was lavaged  with a copious amount of sterile saline and capsule and subcutaneous tissues  reapproximated with a continuous running suture of 4-0 Dexon.  The skin was  reapproximated utilizing a running subcuticular suture of 4-0  Dexon.   ARTHROPLASTY, EXOSTECTOMY, FIFTH TOE, RIGHT FOOT:  A similar procedure that  was performed on the left foot was performed on the right foot with no  additional lesions.   All surgical sites were the infiltrated with approximately 1/8 mL of  dexamethasone phosphate and a mild compressive bandage consisting of  Betadine-soaked Adaptic, sterile 4 x 4's, and sterile Kling.  The patient  tolerated the procedure well and left the operating room in apparent good  condition with vital signs stable to the recovery room.      DBT/MEDQ  D:  02/13/2004  T:  02/13/2004  Job:  [id number]

## 2010-05-25 NOTE — Discharge Summary (Signed)
[name]  Patient:    Patrick Harris, Patrick Harris                      MRN: [id number] Adm. Date:  08/16/98 Disc. Date: 08/22/98 Attending:  Salvatore Decent. Dorris Fetch, M.D. Dictator:   Luretha Rued. Vedia Pereyra. CC:         Salvatore Decent. Dorris Fetch, M.D.             Titus Dubin. Alwyn Ren, M.D. [facility]             Peter C. Eden Emms, M.D. [facility]                           Discharge Summary  DATE OF SURGERY:  August 18, 1998  ADMITTING DIAGNOSIS: 1.  Chest pain, rule out coronary artery disease. 2.  Tobacco history. 3.  Positive family history of tobacco use. 4.  Herniorrhaphy. 5.  Shoulder surgery. 6.  History of pancreatitis.  HISTORY OF PRESENT ILLNESS:  [name] is a 65 year old white male patient of Dr. Alroy Bailiff who began to have chest pain on August 12, 1998 while working in his carport.  His pain subsided with rest.  It recurred on Monday, August 14, 1998, while doing yard work, again with resolution of pain.  However, on the evening of August 15, 1998 this pain began to occur at rest.  [name] has  known family history of coronary artery disease and he also uses tobacco products daily.  He is now admitted for further evaluation of his coronary artery disease.  HOSPITAL COURSE:  [name] was admitted to the hospital on August 16, 1998 after having unstable angina and chest pain.  He underwent cardiac catheterization on August 17, 1998 and was found to have coronary artery bypass grafting with three vessel disease.  [facility] was consulted and Dr. Charlett Lango discussed options for coronary artery bypass grafting and the decision was made to proceed.  Mr. Mierzejewski underwent coronary artery bypass grafting on August 11, 000 with coronary artery bypass grafting x two.  The left internal mammary artery was placed to the LAD and a right internal mammary artery was placed to the right coronary artery.  He tolerated this without  difficulty and was transferred to the SICU in stable condition.  The following evening he was awake and alert, maintaining cardiac index of greater than 2.2.  He was extubated and on postoperative day #1 he had no complaints.  The patient was started to be diuresed and restarted on Lopressor to control his blood pressure, and transferred to unit 2000 without further complications.  By postoperative day #2 he was doing well,  ambulating without difficulty.  Over the next 24 to 48 hours his activity was increased and he began to increase his diet.  He had no further complications. His pacer wires were discontinued, and on August 22, 1998 he was discharged from the hospital without any further problems.  DISCHARGE MEDICATIONS: 1.  Coated aspirin 325 mg 1 q.d. 2.  Darvocet-N 100 1 or 2 tablets q.4h. to q.6h. p.r.n. pain. 3.  Lopressor 25 mg p.o. q.12h.  ACTIVITY:  He is not to perform any heavy lifting, no strenuous activity, no lifting greater than 10 pounds.  DIET:  He is to maintain a low-fat/low-cholesterol diet.  DISPOSITION:  He is to quit smoking.  He is to maintain his appointments as scheduled with Dr. Dietrich Pates on September 07, 1998 at 11:30 and is to have a chest x-ray by Dr. Dietrich Pates, and return to see Dr. Dorris Fetch in three weeks.  DD: 02/13/99 TD:  02/13/99 Job: [id number]

## 2010-08-08 ENCOUNTER — Encounter: Payer: Self-pay | Admitting: Internal Medicine

## 2010-08-09 ENCOUNTER — Ambulatory Visit (INDEPENDENT_AMBULATORY_CARE_PROVIDER_SITE_OTHER): Payer: Medicare Other | Admitting: Internal Medicine

## 2010-08-09 ENCOUNTER — Encounter: Payer: Self-pay | Admitting: Internal Medicine

## 2010-08-09 VITALS — BP 124/76 | HR 56 | Temp 97.7°F | Resp 14 | Ht 72.0 in | Wt 191.8 lb

## 2010-08-09 DIAGNOSIS — I10 Essential (primary) hypertension: Secondary | ICD-10-CM

## 2010-08-09 DIAGNOSIS — E230 Hypopituitarism: Secondary | ICD-10-CM

## 2010-08-09 DIAGNOSIS — D582 Other hemoglobinopathies: Secondary | ICD-10-CM

## 2010-08-09 DIAGNOSIS — Z125 Encounter for screening for malignant neoplasm of prostate: Secondary | ICD-10-CM

## 2010-08-09 DIAGNOSIS — M899 Disorder of bone, unspecified: Secondary | ICD-10-CM

## 2010-08-09 DIAGNOSIS — T887XXA Unspecified adverse effect of drug or medicament, initial encounter: Secondary | ICD-10-CM

## 2010-08-09 DIAGNOSIS — Z0000 Encounter for general adult medical examination without abnormal findings: Secondary | ICD-10-CM

## 2010-08-09 DIAGNOSIS — I2510 Atherosclerotic heart disease of native coronary artery without angina pectoris: Secondary | ICD-10-CM

## 2010-08-09 DIAGNOSIS — E785 Hyperlipidemia, unspecified: Secondary | ICD-10-CM

## 2010-08-09 DIAGNOSIS — Z23 Encounter for immunization: Secondary | ICD-10-CM

## 2010-08-09 DIAGNOSIS — K219 Gastro-esophageal reflux disease without esophagitis: Secondary | ICD-10-CM

## 2010-08-09 DIAGNOSIS — L989 Disorder of the skin and subcutaneous tissue, unspecified: Secondary | ICD-10-CM

## 2010-08-09 LAB — CBC WITH DIFFERENTIAL/PLATELET
Basophils Absolute: 0 10*3/uL (ref 0.0–0.1)
Lymphocytes Relative: 24.6 % (ref 12.0–46.0)
Monocytes Relative: 7.6 % (ref 3.0–12.0)
Neutrophils Relative %: 65.1 % (ref 43.0–77.0)
Platelets: 168 10*3/uL (ref 150.0–400.0)
RDW: 14.3 % (ref 11.5–14.6)
WBC: 7.2 10*3/uL (ref 4.5–10.5)

## 2010-08-09 LAB — BASIC METABOLIC PANEL
BUN: 16 mg/dL (ref 6–23)
Calcium: 9 mg/dL (ref 8.4–10.5)
GFR: 82.54 mL/min (ref >60.00)
Glucose, Bld: 106 mg/dL — ABNORMAL HIGH (ref 70–99)

## 2010-08-09 LAB — LIPID PANEL
Cholesterol: 142 mg/dL (ref 0–200)
LDL Cholesterol: 94 mg/dL (ref 0–99)
Triglycerides: 56 mg/dL (ref 0.0–149.0)

## 2010-08-09 LAB — HEPATIC FUNCTION PANEL
ALT: 24 U/L (ref 0–53)
AST: 28 U/L (ref 0–37)
Alkaline Phosphatase: 69 U/L (ref 39–117)
Bilirubin, Direct: 0.1 mg/dL (ref 0.0–0.3)
Total Protein: 7 g/dL (ref 6.0–8.3)

## 2010-08-09 MED ORDER — OMEPRAZOLE MAGNESIUM 20 MG PO TBEC
20.0000 mg | DELAYED_RELEASE_TABLET | Freq: Every day | ORAL | Status: DC
Start: 2010-08-09 — End: 2011-08-13

## 2010-08-09 MED ORDER — ROSUVASTATIN CALCIUM 20 MG PO TABS: 20.0000 mg | ORAL_TABLET | ORAL | Status: DC

## 2010-08-09 MED ORDER — AMLODIPINE BESYLATE 5 MG PO TABS: 5.0000 mg | ORAL_TABLET | Freq: Every day | ORAL | Status: DC

## 2010-08-09 MED ORDER — NIACIN ER (ANTIHYPERLIPIDEMIC) 500 MG PO TBCR
EXTENDED_RELEASE_TABLET | ORAL | Status: DC
Start: 2010-08-09 — End: 2011-08-13

## 2010-08-09 NOTE — Patient Instructions (Addendum)
Preventive Health Care: Exercise at least 30-45 minutes a day,  3-4 days a week.  Eat a low-fat diet with lots of fruits and vegetables, up to 7-9 servings per day. Consume less than 40 grams of sugar per day from foods & drinks with High Fructose Corn Sugar as # 1,2,3 or # 4 on label. Please see your Dermatologist to assess the L facial lesion. As we discussed , I am concerned about possible Basal Cell cancer

## 2010-08-09 NOTE — Progress Notes (Signed)
Subjective:    Patient ID: Patrick Harris, male    DOB: November 27, 1945, 65 y.o.   MRN: [id number]  HPI Medicare Wellness Visit:  The following psychosocial & medical history were reviewed as required by Medicare.   Social history: caffeine: 3 cups/ day , alcohol:  no ,  tobacco use : quit 2000  & exercise : 3X/week < 30 min.   Home & personal  safety / fall risk: no issues, activities of daily living: no limitations , seatbelt use : yes , and smoke alarm employment : yes .  Power of Attorney/Living Will status : yes  Vision ( as recorded per Nurse) & Hearing  evaluation :  Ophth exam  02/12 : no issues; whisper heard @ 6 ft. Orientation :oriented X3  , memory & recall : normal, spelling  &  math testing: good,and mood & affect :  normal . Depression / anxiety: denied Travel history : never , immunization status :? Shingles & Pneumovax  needed , transfusion history:  ?with CABG in 2001, and preventive health surveillance ( colonoscopies, BMD , etc as per protocol/ [facility]): colonoscopy 2007, Dental care:  Every 6 months . Chart reviewed &  Updated. Active issues reviewed & addressed.       Review of Systems Patient reports no  vision/ hearing changes,anorexia, weight change, fever ,adenopathy, persistant / recurrent hoarseness, swallowing issues, chest pain,palpitations, edema,persistant / recurrent cough, hemoptysis, dyspnea(rest, exertional, paroxysmal nocturnal), gastrointestinal  bleeding (melena, rectal bleeding), abdominal pain, excessive heart burn, GU symptoms( dysuria, hematuria, pyuria, voiding/incontinence  issues) syncope, focal weakness, numbness & tingling, skin/hair/nail changes, or abnormal bruising/bleeding. Musculoskeletal symptoms include R hip pain with ambulation     Objective:   Physical Exam Gen.: Thin but healthy and well-nourished in appearance. Alert, appropriate and cooperative throughout exam. Head: Normocephalic without obvious abnormalities;  No  alopecia  Eyes: No  corneal or conjunctival inflammation noted. Pupils equal round reactive to light and accommodation. Fundal exam is benign without hemorrhages, exudate, papilledema. Extraocular motion intact. Vision grossly normal with lenses. Ears: External  ear exam reveals no significant lesions or deformities. Canals clear .TMs normal. Hearing is grossly normal bilaterally. Nose: External nasal exam reveals no deformity or inflammation. Nasal mucosa are pink and moist. No lesions or exudates noted. Septum  normal  Mouth: Oral mucosa and oropharynx reveal no lesions or exudates. Teeth in good repair; upper plate. Neck: No deformities, masses, or tenderness noted. Range of motion slightly decreased laterally. Thyroid normal. Lungs: Normal respiratory effort; chest expands symmetrically. Lungs are clear to auscultation without rales, wheezes, or increased work of breathing. Heart: Normal rate and rhythm. Normal S1 and S2. No gallop, click, or rub. Grade 1/2 systolic  Murmur @ apex. Abdomen: Bowel sounds normal; abdomen soft and nontender. No masses, organomegaly or hernias noted. Genitalia/DRE:  A small varicocele is noted on the left; prostate is firm without nodules, asymmetry, or enlargement.                                                                                Musculoskeletal/extremities: No deformity or scoliosis noted of  the thoracic or lumbar spine. No clubbing, cyanosis, edema, or deformity noted. Range of  motion  Normal; minimal pain R hip with ROM .Tone & strength  normal.Joints normal. Nail health  good. Vascular: Carotid, radial artery, dorsalis pedis and  posterior tibial pulses are full and equal. No bruits present. Neurologic: Alert and oriented x3. Deep tendon reflexes symmetrical and normal.          Skin: polypoid lesion L wrist; raised ,rough lesion L nasolabial fold Lymph: No cervical, axillary, or inguinal lymphadenopathy present. Psych: Mood and affect are normal. Normally interactive                                                                                          Assessment & Plan:  #1 Medicare Wellness Exam; criteria met ; data entered #2 Problem List reviewed ; Assessment/ Recommendations made #3 nevus left face, rule out basal cell cancer versus inflamed keratosis. Plan: see Orders

## 2010-08-16 ENCOUNTER — Other Ambulatory Visit: Payer: Self-pay | Admitting: Internal Medicine

## 2010-09-26 ENCOUNTER — Encounter: Payer: Self-pay | Admitting: Gastroenterology

## 2011-06-12 ENCOUNTER — Other Ambulatory Visit (INDEPENDENT_AMBULATORY_CARE_PROVIDER_SITE_OTHER): Payer: Medicare Other

## 2011-06-12 DIAGNOSIS — E559 Vitamin D deficiency, unspecified: Secondary | ICD-10-CM

## 2011-06-16 LAB — VITAMIN D 1,25 DIHYDROXY
Vitamin D 1, 25 (OH)2 Total: 40 pg/mL (ref 18–72)
Vitamin D2 1, 25 (OH)2: <8 pg/mL

## 2011-08-13 ENCOUNTER — Ambulatory Visit (INDEPENDENT_AMBULATORY_CARE_PROVIDER_SITE_OTHER): Payer: Medicare Other | Admitting: Internal Medicine

## 2011-08-13 ENCOUNTER — Encounter: Payer: Self-pay | Admitting: Internal Medicine

## 2011-08-13 VITALS — BP 122/74 | HR 53 | Temp 98.0°F | Resp 12 | Ht 72.5 in | Wt 197.2 lb

## 2011-08-13 DIAGNOSIS — N400 Enlarged prostate without lower urinary tract symptoms: Secondary | ICD-10-CM

## 2011-08-13 DIAGNOSIS — K219 Gastro-esophageal reflux disease without esophagitis: Secondary | ICD-10-CM

## 2011-08-13 DIAGNOSIS — Z125 Encounter for screening for malignant neoplasm of prostate: Secondary | ICD-10-CM

## 2011-08-13 DIAGNOSIS — I10 Essential (primary) hypertension: Secondary | ICD-10-CM

## 2011-08-13 DIAGNOSIS — Z0000 Encounter for general adult medical examination without abnormal findings: Secondary | ICD-10-CM

## 2011-08-13 DIAGNOSIS — E291 Testicular hypofunction: Secondary | ICD-10-CM

## 2011-08-13 DIAGNOSIS — M899 Disorder of bone, unspecified: Secondary | ICD-10-CM

## 2011-08-13 DIAGNOSIS — I2510 Atherosclerotic heart disease of native coronary artery without angina pectoris: Secondary | ICD-10-CM

## 2011-08-13 DIAGNOSIS — E785 Hyperlipidemia, unspecified: Secondary | ICD-10-CM

## 2011-08-13 DIAGNOSIS — M949 Disorder of cartilage, unspecified: Secondary | ICD-10-CM

## 2011-08-13 LAB — LIPID PANEL
Cholesterol: 187 mg/dL (ref 0–200)
LDL Cholesterol: 122 mg/dL — ABNORMAL HIGH (ref 0–99)
Triglycerides: 116 mg/dL (ref 0.0–149.0)
VLDL: 23.2 mg/dL (ref 0.0–40.0)

## 2011-08-13 LAB — BASIC METABOLIC PANEL
BUN: 11 mg/dL (ref 6–23)
CO2: 26 mEq/L (ref 19–32)
Chloride: 104 mEq/L (ref 96–112)
GFR: 97.15 mL/min (ref >60.00)
Glucose, Bld: 103 mg/dL — ABNORMAL HIGH (ref 70–99)
Potassium: 4.2 mEq/L (ref 3.5–5.1)
Sodium: 137 mEq/L (ref 135–145)

## 2011-08-13 LAB — CBC WITH DIFFERENTIAL/PLATELET
Basophils Absolute: 0 10*3/uL (ref 0.0–0.1)
Eosinophils Absolute: 0.1 10*3/uL (ref 0.0–0.7)
HCT: 43.2 % (ref 39.0–52.0)
Hemoglobin: 14.4 g/dL (ref 13.0–17.0)
Lymphs Abs: 1.6 10*3/uL (ref 0.7–4.0)
MCHC: 33.5 g/dL (ref 30.0–36.0)
MCV: 96.2 fl (ref 78.0–100.0)
Monocytes Absolute: 0.5 10*3/uL (ref 0.1–1.0)
Neutro Abs: 3 10*3/uL (ref 1.4–7.7)
Platelets: 180 10*3/uL (ref 150.0–400.0)
RDW: 15 % — ABNORMAL HIGH (ref 11.5–14.6)

## 2011-08-13 LAB — HEPATIC FUNCTION PANEL
AST: 26 U/L (ref 0–37)
Albumin: 3.9 g/dL (ref 3.5–5.2)
Total Bilirubin: 0.9 mg/dL (ref 0.3–1.2)

## 2011-08-13 MED ORDER — ROSUVASTATIN CALCIUM 20 MG PO TABS: 20.0000 mg | ORAL_TABLET | ORAL | Status: DC

## 2011-08-13 MED ORDER — NIACIN ER (ANTIHYPERLIPIDEMIC) 500 MG PO TBCR: EXTENDED_RELEASE_TABLET | ORAL | Status: DC

## 2011-08-13 MED ORDER — TAMSULOSIN HCL 0.4 MG PO CAPS: ORAL_CAPSULE | ORAL | Status: DC

## 2011-08-13 MED ORDER — OMEPRAZOLE MAGNESIUM 20 MG PO TBEC: 20.0000 mg | DELAYED_RELEASE_TABLET | Freq: Every day | ORAL | Status: DC

## 2011-08-13 NOTE — Patient Instructions (Addendum)
Dip gauze in  sterile saline and applied to the toes twice a day.  The saline can be purchased at the drugstore or you can make your own .Boil cup of salt in a gallon of water. Store mixture  in a clean container.Report Warning  signs as discussed (red streaks, pus, fever, increasing pain).  Stop the amlodipine and monitor blood pressure.Blood Pressure Goal  Ideally is an AVERAGE < 135/85. This AVERAGE should be calculated from @ least 5-7 BP readings taken @ different times of day on different days of week. You should not respond to isolated BP readings , but rather the AVERAGE for that week  Please try to go on My Chart within the next 24 hours to allow me to release the results directly to you.

## 2011-08-13 NOTE — Addendum Note (Signed)
Addended by: Maurice Small on: 08/13/2011 09:44 AM   Modules accepted: Orders

## 2011-08-13 NOTE — Progress Notes (Signed)
Subjective:    Patient ID: Patrick Harris, male    DOB: May 11, 1945, 66 y.o.   MRN: [id number]  HPI Medicare Wellness Visit:  The following psychosocial & medical history were reviewed as required by Medicare.   Social history: caffeine: 3-4 cups/day , alcohol:  none ,  tobacco use : quit 2000  & exercise : 3X/ week as calisthentics.   Home & personal  safety / fall risk: no issues, activities of daily living: no limitations , seatbelt use :yes , and smoke alarm employment : yes .  Power of Attorney/Living Will status : in place  Vision ( as recorded per Nurse) & Hearing  evaluation :  Ophth exam 1 year ago; no hearing evaluation. Orientation :oriented X 3 , memory & recall :good,  math testing: good,and mood & affect : normal . Depression / anxiety: denied Travel history : never , immunization status :? Shingles needed , transfusion history:  no, and preventive health surveillance ( colonoscopies, BMD , etc as per protocol/ [facility]): colonoscopy up to date, Dental care:  Every 6 mos . Chart reviewed &  Updated. Active issues reviewed & addressed.       Review of Systems HYPERTENSION: Disease Monitoring: Blood pressure range-not monitored  Chest pain, palpitations- no      Dyspnea- no Medications: Compliance- yes  Lightheadedness,Syncope- no    Edema- bilaterally ,LLE > RLE, on Amlodipine  FASTING HYPERGLYCEMIA, PMH of: FBS 106 Disease Monitoring: Blood Sugar ranges-no monitor  Polyuria/phagia/dipsia- nocturia X 3 & daytime frequency      Visual problems- no  HYPERLIPIDEMIA: Disease Monitoring: See symptoms for Hypertension Medications: Compliance- no Abd pain, bowel changes-no   Muscle aches- not significant       Objective:   Physical Exam Gen.:  well-nourished in appearance. Alert, appropriate and cooperative throughout exam. Head: Normocephalic without obvious abnormalities; no alopecia  Eyes: No corneal or conjunctival inflammation noted. Pupils equal round reactive  to light and accommodation. FOV normal. Extraocular motion intact. Vision grossly normal with lenses. Ears: External  ear exam reveals no significant lesions or deformities. Canals clear .TMs normal. Hearing is grossly normal bilaterally. Nose: External nasal exam reveals no deformity or inflammation. Nasal mucosa are pink and moist. No lesions or exudates noted.   Mouth: Oral mucosa and oropharynx reveal no lesions or exudates. Upper plate Neck: No deformities, masses, or tenderness noted. Range of motion decreased. Thyroid normal Lungs: Normal respiratory effort; chest expands symmetrically. Lungs are clear to auscultation without rales, wheezes, or increased work of breathing. Heart: Normal rate and rhythm. Normal S1 and S2. No gallop, click, or rub. S4 with slurring; no  murmur. Abdomen: Bowel sounds normal; abdomen soft and nontender. No masses, organomegaly or hernias noted. Genitalia/ DRE: Genitalia normal except for left varices . Prostate is not enlarged but is firm; no nodules present                                                                                Musculoskeletal/extremities: No deformity or scoliosis noted of  the thoracic or lumbar spine. No clubbing, cyanosis or deformity noted. Range of motion  normal .Tone & strength  normal.Joints normal. Nail health:chronic fungal changes.  1/2+ edema  Vascular: Carotid, radial artery, dorsalis pedis and  posterior tibial pulses are full and equal. No bruits present. Neurologic: Alert and oriented x3. Deep tendon reflexes symmetrical and 1/2+.          Skin: Intact without suspicious lesions or rashes. Resolving exfoliative dermatitis of toes  Lymph: No cervical, axillary, or inguinal lymphadenopathy present. Psych: Mood and affect are normal. Normally interactive                                                                                         Assessment & Plan:  #1 Medicare Wellness Exam; criteria met ; data entered #2  Problem List reviewed ; Assessment/ Recommendations made Plan: see Orders

## 2011-08-14 LAB — VITAMIN D 25 HYDROXY (VIT D DEFICIENCY, FRACTURES): Vit D, 25-Hydroxy: 43 ng/mL (ref 30–89)

## 2011-08-16 LAB — HEMOGLOBIN A1C: Hgb A1c MFr Bld: 5.9 % (ref 4.6–6.5)

## 2011-09-27 ENCOUNTER — Encounter: Payer: Self-pay | Admitting: Gastroenterology

## 2012-02-22 ENCOUNTER — Other Ambulatory Visit: Payer: Self-pay

## 2012-07-09 ENCOUNTER — Other Ambulatory Visit: Payer: Self-pay | Admitting: Internal Medicine

## 2012-08-13 ENCOUNTER — Encounter: Payer: Self-pay | Admitting: Internal Medicine

## 2012-08-13 ENCOUNTER — Ambulatory Visit (INDEPENDENT_AMBULATORY_CARE_PROVIDER_SITE_OTHER): Payer: Medicare Other | Admitting: Internal Medicine

## 2012-08-13 VITALS — BP 136/78 | HR 57 | Temp 98.2°F | Resp 12 | Ht 72.5 in | Wt 201.0 lb

## 2012-08-13 DIAGNOSIS — M949 Disorder of cartilage, unspecified: Secondary | ICD-10-CM

## 2012-08-13 DIAGNOSIS — D582 Other hemoglobinopathies: Secondary | ICD-10-CM

## 2012-08-13 DIAGNOSIS — Z0000 Encounter for general adult medical examination without abnormal findings: Secondary | ICD-10-CM

## 2012-08-13 DIAGNOSIS — M899 Disorder of bone, unspecified: Secondary | ICD-10-CM

## 2012-08-13 DIAGNOSIS — N429 Disorder of prostate, unspecified: Secondary | ICD-10-CM

## 2012-08-13 DIAGNOSIS — I2510 Atherosclerotic heart disease of native coronary artery without angina pectoris: Secondary | ICD-10-CM

## 2012-08-13 DIAGNOSIS — E785 Hyperlipidemia, unspecified: Secondary | ICD-10-CM

## 2012-08-13 DIAGNOSIS — IMO0001 Reserved for inherently not codable concepts without codable children: Secondary | ICD-10-CM

## 2012-08-13 LAB — CK: Total CK: 328 U/L — ABNORMAL HIGH (ref 7–232)

## 2012-08-13 LAB — TSH: TSH: 1.36 u[IU]/mL (ref 0.35–5.50)

## 2012-08-13 MED ORDER — NIACIN ER (ANTIHYPERLIPIDEMIC) 500 MG PO TBCR: EXTENDED_RELEASE_TABLET | ORAL | Status: DC

## 2012-08-13 MED ORDER — ROSUVASTATIN CALCIUM 20 MG PO TABS: ORAL_TABLET | ORAL | Status: DC

## 2012-08-13 MED ORDER — TAMSULOSIN HCL 0.4 MG PO CAPS: ORAL_CAPSULE | ORAL | Status: DC

## 2012-08-13 MED ORDER — OMEPRAZOLE MAGNESIUM 20 MG PO TBEC: 20.0000 mg | DELAYED_RELEASE_TABLET | Freq: Every day | ORAL | Status: DC

## 2012-08-13 NOTE — Progress Notes (Signed)
Subjective:    Patient ID: Patrick Harris, male    DOB: 05-19-1945, 67 y.o.   MRN: [id number]  HPI  Medicare Wellness Visit:  Psychosocial & medical history were reviewed as required by Medicare (abuse,antisocial behavioral risks,firearm risk).  Social history: caffeine:3-4 cups / day  , alcohol:  no ,  tobacco use:  Quit 2000  Exercise :  See below Home & personal  safety / fall risk:no Limitations of activities of daily living:no Seatbelt  and smoke alarm use:yes Power of Attorney/Living Will status : in place Ophthalmology exam status :current Hearing evaluation status:not current Orientation :oriented X 3 Memory & recall :good Spelling or math testing:good Active depression / anxiety:denied Foreign travel history : never Immunization status for Shingles /Flu/ PNA/ tetanus :Shingles needed Transfusion history:  no Preventive health surveillance status of colonoscopy as per protocol/ [id number] Dental care:  Every 6 mos Chart reviewed &  Updated. Active issues reviewed & addressed.      Review of Systems He is on a modified heart healthy diet; he exercises 30 minutes 3 times per week as upper body without symptoms. Specifically he denies chest pain, palpitations, dyspnea, or claudication.  He has edema when not active. Amlodipine also caused edema Family history is negative for premature coronary disease. Advanced cholesterol testing reveals his LDL goal was less than 100.  He denies abdominal pain or bowel changes: He does have muscle pain in his legs and forearms.     Objective:   Physical Exam Gen.: Thin but healthy and well-nourished in appearance. Alert, appropriate and cooperative throughout exam.Appears younger than stated age  Head: Normocephalic without obvious abnormalities; no alopecia  Eyes: No corneal or conjunctival inflammation noted.  Extraocular motion intact. Vision tested. Ears: External  ear exam reveals no significant lesions or deformities. Canals  clear .TMs normal. Hearing is grossly normal bilaterally. Nose: External nasal exam reveals no deformity or inflammation. Nasal mucosa are pink and moist. No lesions or exudates noted.  Mouth: Oral mucosa and oropharynx reveal no lesions or exudates. Teeth in good repair. Neck: No deformities, masses, or tenderness noted. Range of motion decreased. Thyroid normal. Lungs: Normal respiratory effort; chest expands symmetrically. Lungs are clear to auscultation without rales, wheezes, or increased work of breathing. Heart: Slow rate and regular rhythm. Normal S1 and S2. No gallop, click, or rub. No murmur. Abdomen: Bowel sounds normal; abdomen soft and nontender. No masses, organomegaly or hernias noted. Genitalia: Genitalia normal except for slight testicular atrophy & left varices. Prostate is  Asymmetric,R lobe > L w/o nodularity or induration.                                 Musculoskeletal/extremities: No deformity or scoliosis noted of  the thoracic or lumbar spine.   No clubbing, cyanosis,  or significant extremity  deformity noted.Trace edema. Range of motion normal .Tone & strength  Normal. Joints normal . Nail health good. Able to lie down & sit up w/o help. Negative SLR bilaterally Vascular: Carotid, radial artery, dorsalis pedis and  posterior tibial pulses are full and equal. No bruits present. Neurologic: Alert and oriented x3. Deep tendon reflexes symmetrical and normal.   Skin: Intact without suspicious lesions or rashes. Lymph: No cervical, axillary, or inguinal lymphadenopathy present. Psych: Mood and affect are normal. Normally interactive  Assessment & Plan:  #1 Medicare Wellness Exam; criteria met ; data entered #2 Problem List/Diagnoses reviewed #3 abnormal DRE #4 Myalgias #5 edema when inactive; support hose recommended Plan:  Assessments made/ Orders entered

## 2012-08-13 NOTE — Patient Instructions (Addendum)
Preventive Health Care: Eat a low-fat diet with lots of fruits and vegetables, up to 7-9 servings per day. This would eliminate the need for vitamin supplements. Consume less than 40 grams of sugar (preferably ZERO) per day from foods & drinks with High Fructose Corn Sugar as #1,2,3 or # 4 on label. Please take enteric-coated aspirin 81 mg daily with breakfast. Share results with all non [facility] medical staff seen

## 2012-09-17 ENCOUNTER — Other Ambulatory Visit: Payer: Self-pay | Admitting: Dermatology

## 2012-09-18 ENCOUNTER — Encounter: Payer: Self-pay | Admitting: Internal Medicine

## 2012-10-06 ENCOUNTER — Telehealth: Payer: Self-pay | Admitting: Internal Medicine

## 2012-10-06 DIAGNOSIS — T887XXA Unspecified adverse effect of drug or medicament, initial encounter: Secondary | ICD-10-CM

## 2012-10-06 DIAGNOSIS — IMO0001 Reserved for inherently not codable concepts without codable children: Secondary | ICD-10-CM

## 2012-10-06 NOTE — Telephone Encounter (Signed)
Patient called and stated that he was suppose to come back in 8 weeks and get his CK check again on 10/08/2012. Please advise me with this order. Thanks

## 2012-10-06 NOTE — Telephone Encounter (Signed)
Future order for CK sent.//AB/CMA

## 2012-10-09 ENCOUNTER — Other Ambulatory Visit (INDEPENDENT_AMBULATORY_CARE_PROVIDER_SITE_OTHER): Payer: Medicare Other

## 2012-10-09 DIAGNOSIS — T887XXA Unspecified adverse effect of drug or medicament, initial encounter: Secondary | ICD-10-CM

## 2012-10-09 DIAGNOSIS — IMO0001 Reserved for inherently not codable concepts without codable children: Secondary | ICD-10-CM

## 2012-10-09 LAB — CK: Total CK: 208 U/L (ref 7–232)

## 2012-10-13 ENCOUNTER — Other Ambulatory Visit: Payer: Self-pay | Admitting: Internal Medicine

## 2012-10-13 ENCOUNTER — Telehealth: Payer: Self-pay | Admitting: Internal Medicine

## 2012-10-13 DIAGNOSIS — E785 Hyperlipidemia, unspecified: Secondary | ICD-10-CM

## 2012-10-13 NOTE — Telephone Encounter (Signed)
Crestor 20 mg Monday, Wednesday, & Friday. Please  schedule fasting Labs in 10-11 weeks : CK,Lipids, hepatic panel. Code: [id number]

## 2012-10-13 NOTE — Telephone Encounter (Signed)
Patient called and stated that he received his results for his CK and wanted to know do he suppose to start back taking his crestor. Thanks

## 2012-10-13 NOTE — Telephone Encounter (Signed)
Please advise 

## 2012-10-14 ENCOUNTER — Telehealth: Payer: Self-pay | Admitting: *Deleted

## 2012-10-14 ENCOUNTER — Other Ambulatory Visit: Payer: Self-pay | Admitting: *Deleted

## 2012-10-14 DIAGNOSIS — T887XXA Unspecified adverse effect of drug or medicament, initial encounter: Secondary | ICD-10-CM

## 2012-10-14 DIAGNOSIS — E785 Hyperlipidemia, unspecified: Secondary | ICD-10-CM

## 2012-10-14 NOTE — Telephone Encounter (Signed)
Called and spoke with patient regarding his Crestor. He stated that he had some already and didn't need any sent to the pharmacy. He verbalized understanding that he needed to only take the Crestor Monday, Wednesday and Friday.

## 2012-10-14 NOTE — Telephone Encounter (Signed)
Spoke with patient to inform him that he needs to take crestor 20 mg only on Monday, Wednesday and Friday. Also to let him know to make a fasting lab appt in 10 weeks. Patient stated he would call back to make that appt

## 2013-01-25 ENCOUNTER — Other Ambulatory Visit (INDEPENDENT_AMBULATORY_CARE_PROVIDER_SITE_OTHER): Payer: Medicare Other

## 2013-01-25 DIAGNOSIS — E785 Hyperlipidemia, unspecified: Secondary | ICD-10-CM

## 2013-01-25 LAB — LIPID PANEL
CHOLESTEROL: 115 mg/dL (ref 0–200)
HDL: 30.9 mg/dL — ABNORMAL LOW (ref >39.00)
LDL CALC: 66 mg/dL (ref 0–99)
Total CHOL/HDL Ratio: 4
Triglycerides: 90 mg/dL (ref 0.0–149.0)
VLDL: 18 mg/dL (ref 0.0–40.0)

## 2013-01-25 LAB — HEPATIC FUNCTION PANEL
ALK PHOS: 50 U/L (ref 39–117)
ALT: 18 U/L (ref 0–53)
AST: 24 U/L (ref 0–37)
Albumin: 3.8 g/dL (ref 3.5–5.2)
Bilirubin, Direct: 0 mg/dL (ref 0.0–0.3)
TOTAL PROTEIN: 6.9 g/dL (ref 6.0–8.3)
Total Bilirubin: 0.8 mg/dL (ref 0.3–1.2)

## 2013-01-25 LAB — CK: Total CK: 167 U/L (ref 7–232)

## 2013-01-29 ENCOUNTER — Telehealth: Payer: Self-pay | Admitting: Internal Medicine

## 2013-01-29 NOTE — Telephone Encounter (Signed)
Patient is calling for his lab results from 01/25/13 to be released on MyChart. Please advise.

## 2013-01-29 NOTE — Telephone Encounter (Signed)
Spoke with the pt's wife and informed her that the pt's results have not been reviewed yet, but as soon as Dr. Linna Darner reviews them he will be able to see the results through [facility].  Wife understood and stated that she will let the pt know.//AB/CMA

## 2013-04-16 ENCOUNTER — Other Ambulatory Visit: Payer: Self-pay | Admitting: Internal Medicine

## 2013-07-23 ENCOUNTER — Encounter: Payer: Self-pay | Admitting: Podiatrist

## 2013-07-23 ENCOUNTER — Ambulatory Visit (INDEPENDENT_AMBULATORY_CARE_PROVIDER_SITE_OTHER): Payer: 59 | Admitting: Podiatrist

## 2013-07-23 VITALS — BP 143/78 | HR 56 | Resp 18

## 2013-07-23 DIAGNOSIS — B351 Tinea unguium: Secondary | ICD-10-CM

## 2013-07-23 MED ORDER — TAVABOROLE 5 % EX SOLN: 1.0000 [drp] | CUTANEOUS | Status: DC

## 2013-07-23 NOTE — Progress Notes (Signed)
   Subjective:    Patient ID: Patrick Harris, male    DOB: 1945/09/06, 68 y.o.   MRN: [id number]  HPI Dr Valentina Lucks treated me for toenail fungus a few years ago and there is some thickness to some of the nails and is discolored and have a corn on my 3rd toe on my left foot and used Dr Marcelene Butte for about 3 weeks   Patient's tried extensive conservative treatments to treat his toenail fungus. We've tried laser in the past which has failed to completely resolve the issue. He does not want to be on any oral medication. And he's tried multiple over-the-counter remedies. No success has been found.  Review of Systems     Objective:   Physical Exam  Neurovascular status is unchanged and intact. Pedal pulses are palpable and neurological sensation is intact. Slight hammer toe to contraction of the left third toe is noted with a callus laterally. Digital toenails are thick, discolored, dystrophic and mycotic 1  and 2 bilateral.    Assessment & Plan:  Mycotic toenails  Plan: Debrided the toenails and smoothed down. Recommended topical therapy. Discussed oral Lamisil therapy however he would like to speak with his primary care physician first. Discussed laser therapy again however considering it was not successful the first series of treatments I doubt it would be of any benefit the second time

## 2013-07-23 NOTE — Patient Instructions (Signed)
I have called in the topical nail medication for your toenails-- you can contact them about costs and coverage through your insurance.  Use this for 3 months-- see if you notice any difference and if not, consider the lamisil (or speak to Dr. Linna Darner about the Lamisil as well)

## 2013-08-16 ENCOUNTER — Encounter: Payer: Self-pay | Admitting: Internal Medicine

## 2013-08-16 ENCOUNTER — Ambulatory Visit (INDEPENDENT_AMBULATORY_CARE_PROVIDER_SITE_OTHER): Payer: Medicare Other | Admitting: Internal Medicine

## 2013-08-16 ENCOUNTER — Other Ambulatory Visit (INDEPENDENT_AMBULATORY_CARE_PROVIDER_SITE_OTHER): Payer: Medicare Other

## 2013-08-16 ENCOUNTER — Ambulatory Visit (INDEPENDENT_AMBULATORY_CARE_PROVIDER_SITE_OTHER)
Admission: RE | Admit: 2013-08-16 | Discharge: 2013-08-16 | Disposition: A | Discharge status: Home / Self Care | Payer: Medicare Other | Source: Ambulatory Visit | Attending: Internal Medicine | Admitting: Internal Medicine

## 2013-08-16 VITALS — BP 137/73 | HR 57 | Temp 97.0°F | Ht 72.0 in | Wt 198.0 lb

## 2013-08-16 DIAGNOSIS — R059 Cough, unspecified: Secondary | ICD-10-CM

## 2013-08-16 DIAGNOSIS — K219 Gastro-esophageal reflux disease without esophagitis: Secondary | ICD-10-CM

## 2013-08-16 DIAGNOSIS — Z0000 Encounter for general adult medical examination without abnormal findings: Secondary | ICD-10-CM

## 2013-08-16 DIAGNOSIS — R05 Cough: Secondary | ICD-10-CM

## 2013-08-16 DIAGNOSIS — N429 Disorder of prostate, unspecified: Secondary | ICD-10-CM

## 2013-08-16 DIAGNOSIS — E785 Hyperlipidemia, unspecified: Secondary | ICD-10-CM

## 2013-08-16 DIAGNOSIS — R058 Other specified cough: Secondary | ICD-10-CM

## 2013-08-16 DIAGNOSIS — R030 Elevated blood-pressure reading, without diagnosis of hypertension: Secondary | ICD-10-CM

## 2013-08-16 LAB — CK: CK TOTAL: 203 U/L (ref 7–232)

## 2013-08-16 LAB — HEPATIC FUNCTION PANEL
ALT: 19 U/L (ref 0–53)
AST: 25 U/L (ref 0–37)
Albumin: 4 g/dL (ref 3.5–5.2)
Alkaline Phosphatase: 48 U/L (ref 39–117)
Bilirubin, Direct: 0.1 mg/dL (ref 0.0–0.3)
TOTAL PROTEIN: 6.9 g/dL (ref 6.0–8.3)
Total Bilirubin: 0.8 mg/dL (ref 0.2–1.2)

## 2013-08-16 LAB — CBC WITH DIFFERENTIAL/PLATELET
Basophils Absolute: 0 10*3/uL (ref 0.0–0.1)
Basophils Relative: 0.4 % (ref 0.0–3.0)
EOS PCT: 1.4 % (ref 0.0–5.0)
Eosinophils Absolute: 0.1 10*3/uL (ref 0.0–0.7)
HCT: 44.1 % (ref 39.0–52.0)
HEMOGLOBIN: 14.6 g/dL (ref 13.0–17.0)
Lymphocytes Relative: 27.2 % (ref 12.0–46.0)
Lymphs Abs: 1.4 10*3/uL (ref 0.7–4.0)
MCHC: 33.1 g/dL (ref 30.0–36.0)
MCV: 83 fl (ref 78.0–100.0)
MONOS PCT: 10.4 % (ref 3.0–12.0)
Monocytes Absolute: 0.5 10*3/uL (ref 0.1–1.0)
NEUTROS ABS: 3.1 10*3/uL (ref 1.4–7.7)
Neutrophils Relative %: 60.6 % (ref 43.0–77.0)
Platelets: 203 10*3/uL (ref 150.0–400.0)
RBC: 5.32 Mil/uL (ref 4.22–5.81)
RDW: 17.5 % — AB (ref 11.5–15.5)
WBC: 5.1 10*3/uL (ref 4.0–10.5)

## 2013-08-16 LAB — LIPID PANEL
CHOLESTEROL: 113 mg/dL (ref 0–200)
HDL: 28.6 mg/dL — ABNORMAL LOW (ref >39.00)
LDL Cholesterol: 64 mg/dL (ref 0–99)
NonHDL: 84.4
Total CHOL/HDL Ratio: 4
Triglycerides: 103 mg/dL (ref 0.0–149.0)
VLDL: 20.6 mg/dL (ref 0.0–40.0)

## 2013-08-16 LAB — BASIC METABOLIC PANEL
BUN: 11 mg/dL (ref 6–23)
CO2: 26 mEq/L (ref 19–32)
Calcium: 9.1 mg/dL (ref 8.4–10.5)
Chloride: 108 mEq/L (ref 96–112)
Creatinine, Ser: 1 mg/dL (ref 0.4–1.5)
GFR: 81.78 mL/min (ref >60.00)
GLUCOSE: 86 mg/dL (ref 70–99)
POTASSIUM: 4.3 meq/L (ref 3.5–5.1)
SODIUM: 139 meq/L (ref 135–145)

## 2013-08-16 LAB — PSA: PSA: 1.64 ng/mL (ref 0.10–4.00)

## 2013-08-16 LAB — TSH: TSH: 1.61 u[IU]/mL (ref 0.35–4.50)

## 2013-08-16 NOTE — Patient Instructions (Signed)
Minimal Blood Pressure Goal= AVERAGE < 140/90;  Ideal is an AVERAGE < 135/85. This AVERAGE should be calculated from @ least 5-7 BP readings taken @ different times of day on different days of week. You should not respond to isolated BP readings , but rather the AVERAGE for that week .Please bring your  blood pressure cuff to office visits to verify that it is reliable.It  can also be checked against the blood pressure device at the pharmacy. Finger or wrist cuffs are not dependable; an arm cuff is.   Reflux of gastric acid may be asymptomatic as this may occur mainly during sleep.The triggers for reflux  include stress; the "aspirin family" ; alcohol; peppermint; and caffeine (coffee, tea, cola, and chocolate). The aspirin family would include aspirin and the nonsteroidal agents such as ibuprofen &  Naproxen. Tylenol would not cause reflux. If having symptoms ; food & drink should be avoided for @ least 2 hours before going to bed.    Your next office appointment will be determined based upon review of your pending labs & x-rays. Those instructions will be transmitted to you through My Chart  .Followup as needed for your acute issue. Please report any significant change in your symptoms.

## 2013-08-16 NOTE — Assessment & Plan Note (Signed)
Lipids, LFTs, TSH ,CK 

## 2013-08-16 NOTE — Progress Notes (Signed)
Pre visit review using our clinic review tool, if applicable. No additional management support is needed unless otherwise documented below in the visit note. 

## 2013-08-16 NOTE — Assessment & Plan Note (Signed)
Blood pressure goals reviewed. BMET 

## 2013-08-16 NOTE — Progress Notes (Signed)
Subjective:    Patient ID: Patrick Harris, male    DOB: 1945-06-01, 68 y.o.   MRN: [id number]  HPI UHC/ Medicare Wellness Visit: Psychosocial and medical history were reviewed as required by Medicare (history related to abuse, antisocial behavior , firearm risk). Social history: Caffeine: 4 c/day , Alcohol:no  , Tobacco use: quit 2000 Exercise:calisthentics 3 X/ week for 30 min Personal safety/fall risk:no Limitations of activities of daily living:no Seatbelt/ smoke alarm use:yes Healthcare Power of Attorney/Living Will status:  UTD Ophthalmologic exam status:UTD Hearing evaluation status:tested Orientation: Oriented X 3 Memory and recall: good Math testing: good Depression/anxiety assessment: no Foreign travel history:never Immunization status for influenza/pneumonia/ shingles /tetanus: Shingles needed Transfusion history:no Preventive health care maintenance status: Colonoscopy as per protocol/standard care: overdue; he will contact GI Dental care:every 6 mos   Chart reviewed and updated. Active issues reviewed and addressed as documented below.A heart healthy diet is followed   Family history is neg for premature coronary disease. With CAD  LDL goal is less than 70. There is medication compliance with the statin.  Low dose ASA taken  Review of Systems  Specifically denied are  chest pain, palpitations, dyspnea, or claudication.  Significant abdominal symptoms or  memory deficit denied . Myalgias present in bilat LE  Unexplained weight loss, abdominal pain, significant dyspepsia, dysphagia, melena, rectal bleeding, or persistently small caliber stools are denied.  Some sputum intermittently for several months. Occasional nocturnal wheezing . PMH of 37 pack years.       Objective:   Physical Exam Gen.: Healthy and well-nourished in appearance. Alert, appropriate and cooperative throughout exam.Appears younger than stated age 75 younger than stated age  Head:  Normocephalic without obvious abnormalities; no alopecia . Hair fine Eyes: No corneal or conjunctival inflammation noted. Pupils equal round reactive to light and accommodation. Extraocular motion intact.  Ears: External  ear exam reveals no significant lesions or deformities. Canals clear .TMs normal. Hearing is grossly normal bilaterally. Nose: External nasal exam reveals no deformity or inflammation. Nasal mucosa are pink and moist. No lesions or exudates noted.   Mouth: Oral mucosa and oropharynx reveal no lesions or exudates. Upper plate. Neck: No deformities, masses, or tenderness noted. Range of motion decreased. Thyroid normal Lungs: Normal respiratory effort; chest expands symmetrically. Lungs are clear to auscultation without rales, wheezes, or increased work of breathing. Heart: Slow rate; regular rhythm. Normal S1 and S2. No gallop, click, or rub. No murmur. Abdomen: Bowel sounds normal; abdomen soft and nontender. No masses, organomegaly or hernias noted. Genitalia: Genitalia normal except for left varices. Prostate is asymmetric, R lobe > L. No nodularity . Tissue firm.                             Musculoskeletal/extremities: No deformity or scoliosis noted of  the thoracic or lumbar spine.  No clubbing, cyanosis, edema, or significant extremity  deformity noted. Range of motion normal .Tone & strength normal. Hand joints normal Fingernail health good. Able to lie down & sit up w/o help. Negative SLR bilaterally Vascular: Carotid, radial artery, dorsalis pedis and  posterior tibial pulses are full and equal. No bruits present. Neurologic: Alert and oriented x3. Deep tendon reflexes symmetrical and normal.  Gait normal.  Skin: Intact without suspicious lesions or rashes. Lymph: No cervical, axillary, or inguinal lymphadenopathy present. Psych: Mood and affect are normal. Normally interactive  Assessment & Plan:   See Current Assessment & Plan in Problem List under specific DiagnosisThe labs will be reviewed and risks and options assessed. Written recommendations will be provided by mail or directly through My Chart.Further evaluation or change in medical therapy will be directed by those results.

## 2013-08-16 NOTE — Assessment & Plan Note (Signed)
CBC

## 2013-08-17 ENCOUNTER — Other Ambulatory Visit: Payer: Self-pay

## 2013-08-17 MED ORDER — OMEPRAZOLE MAGNESIUM 20 MG PO TBEC: 20.0000 mg | DELAYED_RELEASE_TABLET | Freq: Every day | ORAL | Status: DC

## 2013-08-26 ENCOUNTER — Telehealth: Payer: Self-pay | Admitting: *Deleted

## 2013-08-26 MED ORDER — TAVABOROLE 5 % EX SOLN: 1.0000 [drp] | CUTANEOUS | Status: DC

## 2013-08-26 NOTE — Telephone Encounter (Signed)
Dr. Valentina Lucks prescribed something for toenail fungus.  It comes in 4 ml.  I called to get a refill and they told me it was too soon to get a refill.  I told them I was using it on all 10 toes.  She told me I should call the doctor and request a bigger size, 10 ml.  I told him I would take care of it.  He asked when would it be done?  I told him I will do it now.

## 2013-08-26 NOTE — Telephone Encounter (Signed)
I'd appreciate it if you would return my call.  Thank you.

## 2013-10-14 ENCOUNTER — Other Ambulatory Visit: Payer: Self-pay | Admitting: Internal Medicine

## 2013-10-27 ENCOUNTER — Other Ambulatory Visit: Payer: Self-pay | Admitting: Internal Medicine

## 2014-02-11 ENCOUNTER — Telehealth: Payer: Self-pay | Admitting: *Deleted

## 2014-02-11 MED ORDER — TAVABOROLE 5 % EX SOLN: 1.0000 [drp] | CUTANEOUS | Status: DC

## 2014-02-14 NOTE — Telephone Encounter (Signed)
Entered in error

## 2014-02-15 ENCOUNTER — Telehealth: Payer: Self-pay | Admitting: *Deleted

## 2014-02-15 NOTE — Telephone Encounter (Signed)
Pt states [facility] called him concerning his [place] [facility], he said he had paid $35.00 at [facility], [place] and [facility] stated he would pay $75.00.  I told him the [place] Mon had been called to [facility] at 5009pm on 02//05/2014, and gave him [phone] to call for confirmation.

## 2014-02-17 ENCOUNTER — Other Ambulatory Visit: Payer: Self-pay

## 2014-02-17 MED ORDER — OMEPRAZOLE MAGNESIUM 20 MG PO TBEC: 20.0000 mg | DELAYED_RELEASE_TABLET | Freq: Every day | ORAL | Status: DC

## 2014-03-24 DIAGNOSIS — M25531 Pain in right wrist: Secondary | ICD-10-CM | POA: Diagnosis not present

## 2014-03-24 DIAGNOSIS — M7061 Trochanteric bursitis, right hip: Secondary | ICD-10-CM | POA: Diagnosis not present

## 2014-03-31 DIAGNOSIS — E789 Disorder of lipoprotein metabolism, unspecified: Secondary | ICD-10-CM | POA: Diagnosis not present

## 2014-03-31 DIAGNOSIS — E291 Testicular hypofunction: Secondary | ICD-10-CM | POA: Diagnosis not present

## 2014-03-31 DIAGNOSIS — E221 Hyperprolactinemia: Secondary | ICD-10-CM | POA: Diagnosis not present

## 2014-04-10 ENCOUNTER — Other Ambulatory Visit: Payer: Self-pay | Admitting: Internal Medicine

## 2014-04-25 ENCOUNTER — Other Ambulatory Visit: Payer: Self-pay | Admitting: Internal Medicine

## 2014-06-20 DIAGNOSIS — E291 Testicular hypofunction: Secondary | ICD-10-CM | POA: Diagnosis not present

## 2014-08-19 ENCOUNTER — Other Ambulatory Visit: Payer: Self-pay | Admitting: Internal Medicine

## 2014-08-19 ENCOUNTER — Ambulatory Visit (INDEPENDENT_AMBULATORY_CARE_PROVIDER_SITE_OTHER): Payer: Medicare Other | Admitting: Internal Medicine

## 2014-08-19 ENCOUNTER — Other Ambulatory Visit (INDEPENDENT_AMBULATORY_CARE_PROVIDER_SITE_OTHER): Payer: Medicare Other

## 2014-08-19 ENCOUNTER — Encounter: Payer: Self-pay | Admitting: Internal Medicine

## 2014-08-19 VITALS — BP 134/72 | HR 47 | Temp 97.8°F | Resp 16 | Wt 185.0 lb

## 2014-08-19 DIAGNOSIS — Z23 Encounter for immunization: Secondary | ICD-10-CM

## 2014-08-19 DIAGNOSIS — M791 Myalgia: Secondary | ICD-10-CM | POA: Diagnosis not present

## 2014-08-19 DIAGNOSIS — E291 Testicular hypofunction: Secondary | ICD-10-CM

## 2014-08-19 DIAGNOSIS — M859 Disorder of bone density and structure, unspecified: Secondary | ICD-10-CM

## 2014-08-19 DIAGNOSIS — R748 Abnormal levels of other serum enzymes: Secondary | ICD-10-CM

## 2014-08-19 DIAGNOSIS — M609 Myositis, unspecified: Secondary | ICD-10-CM

## 2014-08-19 DIAGNOSIS — D582 Other hemoglobinopathies: Secondary | ICD-10-CM | POA: Diagnosis not present

## 2014-08-19 DIAGNOSIS — IMO0001 Reserved for inherently not codable concepts without codable children: Secondary | ICD-10-CM

## 2014-08-19 DIAGNOSIS — Z8042 Family history of malignant neoplasm of prostate: Secondary | ICD-10-CM

## 2014-08-19 DIAGNOSIS — E349 Endocrine disorder, unspecified: Secondary | ICD-10-CM

## 2014-08-19 DIAGNOSIS — E785 Hyperlipidemia, unspecified: Secondary | ICD-10-CM

## 2014-08-19 DIAGNOSIS — R030 Elevated blood-pressure reading, without diagnosis of hypertension: Secondary | ICD-10-CM | POA: Diagnosis not present

## 2014-08-19 DIAGNOSIS — Z0000 Encounter for general adult medical examination without abnormal findings: Secondary | ICD-10-CM | POA: Diagnosis not present

## 2014-08-19 LAB — CBC WITH DIFFERENTIAL/PLATELET
BASOS PCT: 0.3 % (ref 0.0–3.0)
Basophils Absolute: 0 10*3/uL (ref 0.0–0.1)
Eosinophils Absolute: 0.1 10*3/uL (ref 0.0–0.7)
Eosinophils Relative: 1.6 % (ref 0.0–5.0)
HCT: 50.1 % (ref 39.0–52.0)
HEMOGLOBIN: 16.7 g/dL (ref 13.0–17.0)
Lymphocytes Relative: 26.8 % (ref 12.0–46.0)
Lymphs Abs: 1.6 10*3/uL (ref 0.7–4.0)
MCHC: 33.3 g/dL (ref 30.0–36.0)
MCV: 87.5 fl (ref 78.0–100.0)
MONO ABS: 0.6 10*3/uL (ref 0.1–1.0)
Monocytes Relative: 10.3 % (ref 3.0–12.0)
Neutro Abs: 3.6 10*3/uL (ref 1.4–7.7)
Neutrophils Relative %: 61 % (ref 43.0–77.0)
Platelets: 173 10*3/uL (ref 150.0–400.0)
RBC: 5.73 Mil/uL (ref 4.22–5.81)
RDW: 17.9 % — ABNORMAL HIGH (ref 11.5–15.5)
WBC: 6 10*3/uL (ref 4.0–10.5)

## 2014-08-19 LAB — MAGNESIUM: Magnesium: 2.1 mg/dL (ref 1.5–2.5)

## 2014-08-19 LAB — LIPID PANEL
CHOLESTEROL: 113 mg/dL (ref 0–200)
HDL: 33.2 mg/dL — AB (ref >39.00)
LDL CALC: 63 mg/dL (ref 0–99)
NonHDL: 79.3
TRIGLYCERIDES: 80 mg/dL (ref 0.0–149.0)
Total CHOL/HDL Ratio: 3
VLDL: 16 mg/dL (ref 0.0–40.0)

## 2014-08-19 LAB — HEPATIC FUNCTION PANEL
ALT: 20 U/L (ref 0–53)
AST: 24 U/L (ref 0–37)
Albumin: 4.1 g/dL (ref 3.5–5.2)
Alkaline Phosphatase: 57 U/L (ref 39–117)
Bilirubin, Direct: 0.2 mg/dL (ref 0.0–0.3)
TOTAL PROTEIN: 6.7 g/dL (ref 6.0–8.3)
Total Bilirubin: 0.6 mg/dL (ref 0.2–1.2)

## 2014-08-19 LAB — BASIC METABOLIC PANEL
BUN: 12 mg/dL (ref 6–23)
CHLORIDE: 104 meq/L (ref 96–112)
CO2: 27 mEq/L (ref 19–32)
CREATININE: 0.91 mg/dL (ref 0.40–1.50)
Calcium: 9.2 mg/dL (ref 8.4–10.5)
GFR: 87.78 mL/min (ref >60.00)
Glucose, Bld: 92 mg/dL (ref 70–99)
POTASSIUM: 4.3 meq/L (ref 3.5–5.1)
Sodium: 139 mEq/L (ref 135–145)

## 2014-08-19 LAB — PSA: PSA: 1.39 ng/mL (ref 0.10–4.00)

## 2014-08-19 LAB — CK: Total CK: 241 U/L — ABNORMAL HIGH (ref 7–232)

## 2014-08-19 LAB — VITAMIN D 25 HYDROXY (VIT D DEFICIENCY, FRACTURES): VITD: 63.96 ng/mL (ref 30.00–100.00)

## 2014-08-19 LAB — TSH: TSH: 1.47 u[IU]/mL (ref 0.35–4.50)

## 2014-08-19 NOTE — Progress Notes (Signed)
   Subjective:    Patient ID: Patrick Harris, male    DOB: 03-08-1945, 69 y.o.   MRN: [id number]  HPI  Medicare Wellness Visit: Psychosocial and medical history were reviewed as required by Medicare (history related to abuse, antisocial behavior , firearm risk). Social history: Caffeine: 5-6 cups per day Alcohol:  None Tobacco use: Quit 2000 Exercise: Cardiac exercise 20 minutes 3 times a week without associated symptoms Personal safety/fall risk: No Limitations of activities of daily living: No Seatbelt/ smoke alarm use: Yes Healthcare Power of Attorney/Living Will status and End of Life process assessment : Up-to-date Ophthalmologic exam status: Needed Hearing evaluation status: Needed Orientation: Oriented X 3 Memory and recall: Good Spelling testing: Good Depression/anxiety assessment: No Foreign travel history: Never Immunization status for influenza/pneumonia/ shingles /tetanus: Reviewed by CMA; Prevnar given Transfusion history: No Preventive health care maintenance status: Colonoscopy as per protocol/standard care: Not up-to-date; no active GI symptoms Dental care: Every 6 months Chart reviewed and updated. Active issues reviewed and addressed as documented below.  He does have some chest discomfort when he first wakes up. This resolves with several deep breaths. He has no exertional chest pain.  He is followed by an Endocrinologist who prescribes his AndroGel. He also prescribed Flomax and Cialis for urinary frequency and nocturia. Initially this worked but now he will have nocturia twice at night and continues to have some frequency  He has constant calf pain. This is been evaluated repeatedly without a diagnosis being made.  Review of Systems  Palpitations, tachycardia, exertional dyspnea, paroxysmal nocturnal dyspnea, claudication or edema are absent. No unexplained weight loss, abdominal pain, significant dyspepsia, dysphagia, melena, rectal bleeding, or persistently  small caliber stools. Dysuria, pyuria, hematuria, or polyuria are denied. Change in hair, skin, nails denied. No bowel changes of constipation or diarrhea. No intolerance to heat or cold.      Objective:   Physical Exam  Pertinent or positive findings include: Upper plate present. He has slight grayish hyperpigmentation of the sclera medially> laterally and present bilaterally. He has decreased cervical range of motion. Knee reflexes are 0+. Slight crepitus of the knees. Prostate is small without nodularity or induration. He has bilateral granulomas in the scrotum and varices on the left. He has some varicosities in the left lower extremity with evidence of broken veins.  General appearance :adequately nourished; in no distress.  Eyes: No conjunctival inflammation or scleral icterus is present.  Oral exam:  Lips and gums are healthy appearing.There is no oropharyngeal erythema or exudate noted.   Heart:  Normal rate and regular rhythm. S1 and S2 normal without gallop, murmur, click, rub or other extra sounds    Lungs:Chest clear to auscultation; no wheezes, rhonchi,rales ,or rubs present.No increased work of breathing.   Abdomen: bowel sounds normal, soft and non-tender without masses, organomegaly or hernias noted.  No guarding or rebound.   Vascular : all pulses equal ; no bruits present.  Skin:Warm & dry.  Intact without suspicious lesions or rashes ; no tenting or jaundice   Lymphatic: No lymphadenopathy is noted about the head, neck, axilla, or inguinal areas.   Neuro: Strength, tone  normal.        Assessment & Plan:  See Current Assessment & Plan in Problem List under specific DiagnosisThe labs will be reviewed and risks and options assessed. Written recommendations will be provided by mail or directly through My Chart.Further evaluation or change in medical therapy will be directed by those results.

## 2014-08-19 NOTE — Assessment & Plan Note (Signed)
CBC

## 2014-08-19 NOTE — Patient Instructions (Signed)
  Your next office appointment will be determined based upon review of your pending labs  and  xrays  Those written interpretation of the lab results and instructions will be transmitted to you by My Chart   Critical results will be called.   Followup as needed for any active or acute issue. Please report any significant change in your symptoms.  Please verify with your gastroenterologist when you colonoscopy is due.

## 2014-08-19 NOTE — Assessment & Plan Note (Signed)
Blood pressure goals reviewed. BMET 

## 2014-08-19 NOTE — Progress Notes (Signed)
Pre visit review using our clinic review tool, if applicable. No additional management support is needed unless otherwise documented below in the visit note. 

## 2014-08-19 NOTE — Assessment & Plan Note (Signed)
Lipids, LFTs, TSH ,CK 

## 2014-08-19 NOTE — Assessment & Plan Note (Signed)
PSA

## 2014-08-25 ENCOUNTER — Other Ambulatory Visit: Payer: Self-pay | Admitting: Emergency Medicine

## 2014-08-25 MED ORDER — OMEPRAZOLE MAGNESIUM 20 MG PO TBEC: 20.0000 mg | DELAYED_RELEASE_TABLET | Freq: Every day | ORAL | Status: DC

## 2014-08-30 ENCOUNTER — Other Ambulatory Visit: Payer: Self-pay | Admitting: *Deleted

## 2014-08-30 MED ORDER — OMEPRAZOLE 20 MG PO CPDR: 20.0000 mg | DELAYED_RELEASE_CAPSULE | Freq: Every day | ORAL | Status: DC

## 2014-08-30 MED ORDER — ROSUVASTATIN CALCIUM 20 MG PO TABS
20.0000 mg | ORAL_TABLET | Freq: Every day | ORAL | Status: DC
Start: 2014-08-30 — End: 2015-08-21

## 2014-08-30 MED ORDER — TAMSULOSIN HCL 0.4 MG PO CAPS: 0.4000 mg | ORAL_CAPSULE | Freq: Every day | ORAL | Status: DC

## 2014-08-30 NOTE — Telephone Encounter (Signed)
Receive call pt states he saw md 08/19/14 for CPX was told medications will be sent to [facility]. Pt staes pharmacy haven't receive. Inform pt will send electronically to [facility] [place]...Patrick Harris

## 2014-09-13 ENCOUNTER — Encounter: Payer: Self-pay | Admitting: Internal Medicine

## 2014-09-13 ENCOUNTER — Other Ambulatory Visit (INDEPENDENT_AMBULATORY_CARE_PROVIDER_SITE_OTHER): Payer: Medicare Other

## 2014-09-13 DIAGNOSIS — R748 Abnormal levels of other serum enzymes: Secondary | ICD-10-CM | POA: Diagnosis not present

## 2014-09-13 LAB — CK: Total CK: 156 U/L (ref 7–232)

## 2014-09-14 NOTE — Telephone Encounter (Signed)
Please advise 

## 2014-09-26 DIAGNOSIS — E789 Disorder of lipoprotein metabolism, unspecified: Secondary | ICD-10-CM | POA: Diagnosis not present

## 2014-09-26 DIAGNOSIS — E291 Testicular hypofunction: Secondary | ICD-10-CM | POA: Diagnosis not present

## 2014-09-26 DIAGNOSIS — E221 Hyperprolactinemia: Secondary | ICD-10-CM | POA: Diagnosis not present

## 2014-10-03 DIAGNOSIS — Z23 Encounter for immunization: Secondary | ICD-10-CM | POA: Diagnosis not present

## 2014-10-03 DIAGNOSIS — E291 Testicular hypofunction: Secondary | ICD-10-CM | POA: Diagnosis not present

## 2014-10-03 DIAGNOSIS — E221 Hyperprolactinemia: Secondary | ICD-10-CM | POA: Diagnosis not present

## 2014-10-03 DIAGNOSIS — E789 Disorder of lipoprotein metabolism, unspecified: Secondary | ICD-10-CM | POA: Diagnosis not present

## 2014-11-18 DIAGNOSIS — H52203 Unspecified astigmatism, bilateral: Secondary | ICD-10-CM | POA: Diagnosis not present

## 2014-11-18 DIAGNOSIS — H2513 Age-related nuclear cataract, bilateral: Secondary | ICD-10-CM | POA: Diagnosis not present

## 2014-11-22 ENCOUNTER — Other Ambulatory Visit: Payer: Self-pay | Admitting: Internal Medicine

## 2014-11-22 ENCOUNTER — Other Ambulatory Visit (INDEPENDENT_AMBULATORY_CARE_PROVIDER_SITE_OTHER): Payer: Medicare Other

## 2014-11-22 DIAGNOSIS — E785 Hyperlipidemia, unspecified: Secondary | ICD-10-CM

## 2014-11-22 LAB — HEPATIC FUNCTION PANEL
ALBUMIN: 4.2 g/dL (ref 3.5–5.2)
ALK PHOS: 59 U/L (ref 39–117)
ALT: 19 U/L (ref 0–53)
AST: 21 U/L (ref 0–37)
Bilirubin, Direct: 0.1 mg/dL (ref 0.0–0.3)
TOTAL PROTEIN: 7.2 g/dL (ref 6.0–8.3)
Total Bilirubin: 0.7 mg/dL (ref 0.2–1.2)

## 2014-11-22 LAB — LIPID PANEL
CHOL/HDL RATIO: 6
Cholesterol: 200 mg/dL (ref 0–200)
HDL: 32.4 mg/dL — ABNORMAL LOW (ref >39.00)
LDL Cholesterol: 147 mg/dL — ABNORMAL HIGH (ref 0–99)
NONHDL: 167.62
Triglycerides: 103 mg/dL (ref 0.0–149.0)
VLDL: 20.6 mg/dL (ref 0.0–40.0)

## 2014-11-22 LAB — CK: CK TOTAL: 147 U/L (ref 7–232)

## 2014-11-28 ENCOUNTER — Encounter: Payer: Self-pay | Admitting: Internal Medicine

## 2014-11-30 DIAGNOSIS — E291 Testicular hypofunction: Secondary | ICD-10-CM | POA: Diagnosis not present

## 2014-11-30 DIAGNOSIS — E221 Hyperprolactinemia: Secondary | ICD-10-CM | POA: Diagnosis not present

## 2014-12-01 ENCOUNTER — Encounter: Payer: Self-pay | Admitting: Internal Medicine

## 2014-12-08 DIAGNOSIS — E291 Testicular hypofunction: Secondary | ICD-10-CM | POA: Diagnosis not present

## 2014-12-08 DIAGNOSIS — E789 Disorder of lipoprotein metabolism, unspecified: Secondary | ICD-10-CM | POA: Diagnosis not present

## 2014-12-08 DIAGNOSIS — I10 Essential (primary) hypertension: Secondary | ICD-10-CM | POA: Diagnosis not present

## 2014-12-08 DIAGNOSIS — E237 Disorder of pituitary gland, unspecified: Secondary | ICD-10-CM | POA: Diagnosis not present

## 2015-01-18 ENCOUNTER — Encounter (HOSPITAL_COMMUNITY): Payer: Self-pay

## 2015-01-18 ENCOUNTER — Emergency Department (HOSPITAL_COMMUNITY): Payer: Medicare Other

## 2015-01-18 ENCOUNTER — Emergency Department (HOSPITAL_COMMUNITY)
Admission: EM | Admit: 2015-01-18 | Discharge: 2015-01-18 | Disposition: A | Discharge status: Home / Self Care | Payer: Medicare Other | Attending: Emergency Medicine | Admitting: Emergency Medicine

## 2015-01-18 ENCOUNTER — Telehealth: Payer: Self-pay | Admitting: Internal Medicine

## 2015-01-18 DIAGNOSIS — K219 Gastro-esophageal reflux disease without esophagitis: Secondary | ICD-10-CM | POA: Insufficient documentation

## 2015-01-18 DIAGNOSIS — Z87891 Personal history of nicotine dependence: Secondary | ICD-10-CM | POA: Insufficient documentation

## 2015-01-18 DIAGNOSIS — I2510 Atherosclerotic heart disease of native coronary artery without angina pectoris: Secondary | ICD-10-CM | POA: Diagnosis not present

## 2015-01-18 DIAGNOSIS — Z91040 Latex allergy status: Secondary | ICD-10-CM | POA: Insufficient documentation

## 2015-01-18 DIAGNOSIS — Z79899 Other long term (current) drug therapy: Secondary | ICD-10-CM | POA: Diagnosis not present

## 2015-01-18 DIAGNOSIS — R200 Anesthesia of skin: Secondary | ICD-10-CM | POA: Diagnosis present

## 2015-01-18 DIAGNOSIS — I160 Hypertensive urgency: Secondary | ICD-10-CM | POA: Insufficient documentation

## 2015-01-18 DIAGNOSIS — M8580 Other specified disorders of bone density and structure, unspecified site: Secondary | ICD-10-CM | POA: Insufficient documentation

## 2015-01-18 DIAGNOSIS — Z87448 Personal history of other diseases of urinary system: Secondary | ICD-10-CM | POA: Diagnosis not present

## 2015-01-18 DIAGNOSIS — E785 Hyperlipidemia, unspecified: Secondary | ICD-10-CM | POA: Diagnosis not present

## 2015-01-18 LAB — CBC WITH DIFFERENTIAL/PLATELET
Basophils Absolute: 0 10*3/uL (ref 0.0–0.1)
Basophils Relative: 1 %
EOS ABS: 0.1 10*3/uL (ref 0.0–0.7)
Eosinophils Relative: 1 %
HCT: 48.4 % (ref 39.0–52.0)
HEMOGLOBIN: 16.9 g/dL (ref 13.0–17.0)
LYMPHS ABS: 1.6 10*3/uL (ref 0.7–4.0)
Lymphocytes Relative: 29 %
MCH: 32.6 pg (ref 26.0–34.0)
MCHC: 34.9 g/dL (ref 30.0–36.0)
MCV: 93.3 fL (ref 78.0–100.0)
Monocytes Absolute: 0.6 10*3/uL (ref 0.1–1.0)
Monocytes Relative: 12 %
NEUTROS PCT: 57 %
Neutro Abs: 3.1 10*3/uL (ref 1.7–7.7)
Platelets: 156 10*3/uL (ref 150–400)
RBC: 5.19 MIL/uL (ref 4.22–5.81)
RDW: 13.3 % (ref 11.5–15.5)
WBC: 5.4 10*3/uL (ref 4.0–10.5)

## 2015-01-18 LAB — RAPID URINE DRUG SCREEN, HOSP PERFORMED
AMPHETAMINES: NOT DETECTED
BARBITURATES: NOT DETECTED
BENZODIAZEPINES: NOT DETECTED
COCAINE: NOT DETECTED
Opiates: NOT DETECTED
TETRAHYDROCANNABINOL: NOT DETECTED

## 2015-01-18 LAB — COMPREHENSIVE METABOLIC PANEL
ALT: 21 U/L (ref 17–63)
AST: 22 U/L (ref 15–41)
Albumin: 4.1 g/dL (ref 3.5–5.0)
Alkaline Phosphatase: 55 U/L (ref 38–126)
Anion gap: 8 (ref 5–15)
BUN: 11 mg/dL (ref 6–20)
CHLORIDE: 105 mmol/L (ref 101–111)
CO2: 27 mmol/L (ref 22–32)
CREATININE: 0.95 mg/dL (ref 0.61–1.24)
Calcium: 9.3 mg/dL (ref 8.9–10.3)
GFR calc non Af Amer: >60 mL/min (ref >60)
Glucose, Bld: 103 mg/dL — ABNORMAL HIGH (ref 65–99)
POTASSIUM: 4.2 mmol/L (ref 3.5–5.1)
Sodium: 140 mmol/L (ref 135–145)
Total Bilirubin: 0.7 mg/dL (ref 0.3–1.2)
Total Protein: 7.2 g/dL (ref 6.5–8.1)

## 2015-01-18 LAB — URINE MICROSCOPIC-ADD ON

## 2015-01-18 LAB — TROPONIN I: Troponin I: <0.03 ng/mL (ref <0.031)

## 2015-01-18 LAB — URINALYSIS, ROUTINE W REFLEX MICROSCOPIC
BILIRUBIN URINE: NEGATIVE
GLUCOSE, UA: NEGATIVE mg/dL
KETONES UR: NEGATIVE mg/dL
Leukocytes, UA: NEGATIVE
Nitrite: NEGATIVE
PROTEIN: NEGATIVE mg/dL
Specific Gravity, Urine: <1.005 — ABNORMAL LOW (ref 1.005–1.030)
pH: 7 (ref 5.0–8.0)

## 2015-01-18 LAB — PROTIME-INR
INR: 1.17 (ref 0.00–1.49)
PROTHROMBIN TIME: 15 s (ref 11.6–15.2)

## 2015-01-18 LAB — CK: CK TOTAL: 130 U/L (ref 49–397)

## 2015-01-18 LAB — ETHANOL

## 2015-01-18 LAB — APTT: APTT: 29 s (ref 24–37)

## 2015-01-18 MED ORDER — SODIUM CHLORIDE 0.9 % IV BOLUS (SEPSIS)
500.0000 mL | Freq: Once | INTRAVENOUS | Status: AC
  Administered 2015-01-18: 500 mL via INTRAVENOUS

## 2015-01-18 MED ORDER — HYDROCHLOROTHIAZIDE 25 MG PO TABS
25.0000 mg | ORAL_TABLET | Freq: Once | ORAL | Status: AC
  Administered 2015-01-18: 25 mg via ORAL
  Filled 2015-01-18: qty 1

## 2015-01-18 MED ORDER — SODIUM CHLORIDE 0.9 % IV SOLN
100.0000 mL/h | INTRAVENOUS | Status: DC
  Administered 2015-01-18: 100 mL/h via INTRAVENOUS

## 2015-01-18 MED ORDER — HYDROCHLOROTHIAZIDE 25 MG PO TABS: 25.0000 mg | ORAL_TABLET | Freq: Every day | ORAL | Status: DC

## 2015-01-18 NOTE — ED Notes (Signed)
Pt reports he checks his bp daily at home and noticed his pressure has been going up for the past 3 days.  Also reports numbness to left side of face x 3 days.  Pt says left eye feels "funny."  Pt says can't explain how his eye feels but says feels different.  Pt says today bp was 180/70 at home.   Says the numbness in his face is worse the higher his bp is.

## 2015-01-18 NOTE — ED Provider Notes (Signed)
CSN: [id number]     Arrival date & time 01/18/15  1314 History   First MD Initiated Contact with Patient 01/18/15 1328     Chief Complaint  Patient presents with  . Numbness     (Consider location/radiation/quality/duration/timing/severity/associated sxs/prior Treatment) HPI Patient presents with concern of head pain, numbness. Symptoms began about 3 days ago, since onset symptoms of been inconsistent, though increasingly present. He describes numbness in the left face, pain in the right head, numbness in the left lower extremity, but no weakness. No new confusion, disorientation, aphasia, dysphasia, chest pain, nausea, vomiting. Patient had URI like illness 3 weeks ago, but recovered, and was in his usual state of health until 3 days ago. Since onset, no clear alleviating or exacerbating factors. Patient has noticed his blood pressure has increased over the past few days in addition to his new symptoms. He denies history of stroke, aneurysm. He takes all medication as directed, including statin, Past Medical History  Diagnosis Date  . Hypertension   . Hypopituitarism ([place])     hypofunction, Dr.Dennish Kohut  . Testosterone deficiency     Dr Wilson Singer  . Peyronie disease   . Hyperlipidemia   . Elevated glycated hemoglobin   . GERD (gastroesophageal reflux disease)   . Osteopenia     Dr Wilson Singer  . CAD (coronary artery disease)    Past Surgical History  Procedure Laterality Date  . Fractured calcaneus  1969    bilaterally w/ ankle fusion  . Hernia repair  1984  . Shoulder surgery  1981    left  . Coronary artery bypass graft  2001    2 vessels  . Bone spur  2006    5th toe bilaterally  . Menisectomy  2007    R medial, Dr. Lorin Mercy  . Colonoscopy  2007    negative; Dr Sharlett Iles  . Upper gastrointestinal endoscopy  2007    GERD, Dr Sharlett Iles   Family History  Problem Relation Age of Onset  . Heart attack Father 35  . Diabetes Mother   . Stroke Mother 69  . Hypertension  Mother   . Leukemia Sister   . Colon cancer Paternal Uncle   . Aortic aneurysm Brother     abdominal  . Cancer Maternal Uncle      X3:prostate , renal, bone    Social History  Substance Use Topics  . Smoking status: Former Smoker    Quit date: 01/07/1998  . Smokeless tobacco: None     Comment: smoked 1963- 2000, up to 1 ppd  . Alcohol Use: No    Review of Systems  Constitutional:       Per HPI, otherwise negative  HENT:       Per HPI, otherwise negative  Respiratory:       Per HPI, otherwise negative  Cardiovascular:       Per HPI, otherwise negative  Gastrointestinal: Negative for vomiting.  Endocrine:       Negative aside from HPI  Genitourinary:       Neg aside from HPI   Musculoskeletal:       Per HPI, otherwise negative  Skin: Negative.   Neurological: Positive for numbness and headaches. Negative for dizziness, syncope, facial asymmetry and speech difficulty.      Allergies  Latex; Amlodipine; and Simvastatin  Home Medications   Prior to Admission medications   Medication Sig Start Date End Date Taking? Authorizing Provider  ASTRAGALUS PO Take 500 mg by mouth daily.  Yes Historical Provider, MD  bromocriptine (PARLODEL) 2.5 MG tablet Take 5 mg by mouth daily. 2 BY MOUTH DAILY   Yes Historical Provider, MD  Calcium-Magnesium-Zinc (CAL-MAG-ZINC PO) Take by mouth daily.     Yes Historical Provider, MD  Cholecalciferol (VITAMIN D3) 2000 UNITS TABS Take by mouth. 2000 iu in the am, 1000 iu in the pm   Yes Historical Provider, MD  Coenzyme Q10 (COQ10) 100 MG CAPS Take by mouth daily.     Yes Historical Provider, MD  CRANBERRY PO Take by mouth. 1 tablespoon daily    Yes Historical Provider, MD  Flaxseed, Linseed, (FLAX SEED OIL PO) Take by mouth.     Yes Historical Provider, MD  Iodine, Kelp, (KELP PO) Take by mouth daily.   Yes Historical Provider, MD  L-Arginine 500 MG CAPS Take by mouth daily.     Yes Historical Provider, MD  Omega-3 Fatty Acids (FISH OIL)  1000 MG CAPS Take by mouth daily.     Yes Historical Provider, MD  omeprazole (PRILOSEC OTC) 20 MG tablet Take 1 tablet (20 mg total) by mouth daily. 08/25/14  Yes Hendricks Limes, MD  Pomegranate Seed 80 % OIL by Does not apply route. 1 table spoon a day    Yes Historical Provider, MD  Potassium 99 MG TABS Take by mouth daily.     Yes Historical Provider, MD  Probiotic Product (PROBIOTIC DAILY PO) Take by mouth daily.   Yes Historical Provider, MD  rosuvastatin (CRESTOR) 20 MG tablet Take 1 tablet (20 mg total) by mouth daily. 08/30/14  Yes Hendricks Limes, MD  tadalafil (CIALIS) 5 MG tablet Take 5 mg by mouth daily.     Yes Historical Provider, MD  tamsulosin (FLOMAX) 0.4 MG CAPS capsule Take 1 capsule (0.4 mg total) by mouth daily. 08/30/14  Yes Hendricks Limes, MD  testosterone (ANDROGEL) 50 MG/5GM GEL Place onto the skin daily.     Yes Historical Provider, MD  Turmeric 500 MG CAPS Take by mouth.     Yes Historical Provider, MD  hydrochlorothiazide (HYDRODIURIL) 25 MG tablet Take 1 tablet (25 mg total) by mouth daily. 01/18/15   Carmin Muskrat, MD  Tavaborole (KERYDIN) 5 % SOLN Apply 1 drop topically 1 day or 1 dose. Apply 1 drop to the toenail daily. Patient not taking: Reported on 01/18/2015 02/11/14   Bronson Ing, DPM   BP 176/72 mmHg  Pulse 46  Temp(Src) 97.9 F (36.6 C) (Oral)  Resp 20  Ht 6' (1.829 m)  Wt 186 lb (84.369 kg)  BMI 25.22 kg/m2  SpO2 99% Physical Exam  Constitutional: He is oriented to person, place, and time. He appears well-developed. No distress.  HENT:  Head: Normocephalic and atraumatic.  Eyes: Conjunctivae and EOM are normal.  Cardiovascular: Normal rate and regular rhythm.   Pulmonary/Chest: Effort normal. No stridor. No respiratory distress.  Abdominal: He exhibits no distension.  Musculoskeletal: He exhibits no edema.  Neurological: He is alert and oriented to person, place, and time. He displays no tremor. He displays no seizure activity.  4/5  strength in the left lower extremity with hip flexion, otherwise neurologic exam unremarkable in the extremities. Patient has decreased sensation in the left face, though no gross facial asymmetry, no speech deficit.   Skin: Skin is warm and dry.  Psychiatric: He has a normal mood and affect.  Nursing note and vitals reviewed.   ED Course  Procedures (including critical care time) Labs Review Labs Reviewed  COMPREHENSIVE METABOLIC PANEL - Abnormal; Notable for the following:    Glucose, Bld 103 (*)    All other components within normal limits  URINALYSIS, ROUTINE W REFLEX MICROSCOPIC (NOT AT [facility]) - Abnormal; Notable for the following:    Specific Gravity, Urine <1.005 (*)    Hgb urine dipstick TRACE (*)    All other components within normal limits  URINE MICROSCOPIC-ADD ON - Abnormal; Notable for the following:    Squamous Epithelial / LPF 0-5 (*)    Bacteria, UA RARE (*)    All other components within normal limits  APTT  CBC WITH DIFFERENTIAL/PLATELET  ETHANOL  TROPONIN I  PROTIME-INR  URINE RAPID DRUG SCREEN, HOSP PERFORMED  CK    Imaging Review Dg Chest 2 View  01/18/2015  CLINICAL DATA:  Hypertensive emergency. Left-sided facial numbness. Coronary artery disease. EXAM: CHEST  2 VIEW COMPARISON:  08/16/2013 FINDINGS: The heart size and mediastinal contours are within normal limits. Stable mild pulmonary hyperinflation and chronic interstitial prominence. No evidence of pulmonary consolidation or edema. No evidence of pneumothorax or pleural effusion. Prior CABG again noted. IMPRESSION: No active cardiopulmonary disease. Electronically Signed   By: Earle Gell M.D.   On: 01/18/2015 14:54   Ct Head Wo Contrast  01/18/2015  CLINICAL DATA:  Left-sided facial numbness. Elevated blood pressure for 3 days. History of pituitary tumor. EXAM: CT HEAD WITHOUT CONTRAST TECHNIQUE: Contiguous axial images were obtained from the base of the skull through the vertex without intravenous  contrast. COMPARISON:  None. FINDINGS: There is no evidence of mass effect, midline shift or extra-axial fluid collections. There is no evidence of a space-occupying lesion or intracranial hemorrhage. There is no evidence of a cortical-based area of acute infarction. The ventricles and sulci are appropriate for the patient's age. The basal cisterns are patent. Visualized portions of the orbits are unremarkable. The visualized portions of the paranasal sinuses and mastoid air cells are unremarkable. The osseous structures are unremarkable. IMPRESSION: No acute intracranial pathology. Electronically Signed   By: Kathreen Devoid   On: 01/18/2015 14:43   Mr Brain Wo Contrast (neuro Protocol)  01/18/2015  CLINICAL DATA:  Left-sided facial numbness over the last 3 days. Right-sided headache. Known history of pituitary tumor. EXAM: MRI HEAD WITHOUT CONTRAST MRA HEAD WITHOUT CONTRAST TECHNIQUE: Multiplanar, multiecho pulse sequences of the brain and surrounding structures were obtained without intravenous contrast. Angiographic images of the head were obtained using MRA technique without contrast. COMPARISON:  Head CT same day FINDINGS: MRI HEAD FINDINGS The brain has a normal appearance on all pulse sequences without evidence of malformation, atrophy, old or acute infarction, mass lesion, hemorrhage, hydrocephalus or extra-axial collection. No pituitary mass visible on this routine brain MR. No fluid in the sinuses, middle ears or mastoids. No skull or skullbase lesion. There is flow in the major vessels at the base of the brain. Major venous sinuses show flow. MRA HEAD FINDINGS Both internal carotid arteries are widely patent into the brain. The anterior and middle cerebral vessels are normal without proximal stenosis, aneurysm or vascular malformation. A1 segment on the left is hypoplastic with the anterior cerebral arteries receiving most of there supply from the right carotid circulation. Both vertebral arteries are  widely patent to the basilar. No basilar stenosis. Posterior circulation branch vessels are normal. Large posterior communicating artery on the right gives most of the supply to the right PCA. IMPRESSION: Normal MRI of the brain. Normal intracranial MR angiography. Electronically Signed   By: Elta Guadeloupe  Shogry M.D.   On: 01/18/2015 17:24   Mr Jodene Nam Head (cerebral Arteries)  01/18/2015  CLINICAL DATA:  Left-sided facial numbness over the last 3 days. Right-sided headache. Known history of pituitary tumor. EXAM: MRI HEAD WITHOUT CONTRAST MRA HEAD WITHOUT CONTRAST TECHNIQUE: Multiplanar, multiecho pulse sequences of the brain and surrounding structures were obtained without intravenous contrast. Angiographic images of the head were obtained using MRA technique without contrast. COMPARISON:  Head CT same day FINDINGS: MRI HEAD FINDINGS The brain has a normal appearance on all pulse sequences without evidence of malformation, atrophy, old or acute infarction, mass lesion, hemorrhage, hydrocephalus or extra-axial collection. No pituitary mass visible on this routine brain MR. No fluid in the sinuses, middle ears or mastoids. No skull or skullbase lesion. There is flow in the major vessels at the base of the brain. Major venous sinuses show flow. MRA HEAD FINDINGS Both internal carotid arteries are widely patent into the brain. The anterior and middle cerebral vessels are normal without proximal stenosis, aneurysm or vascular malformation. A1 segment on the left is hypoplastic with the anterior cerebral arteries receiving most of there supply from the right carotid circulation. Both vertebral arteries are widely patent to the basilar. No basilar stenosis. Posterior circulation branch vessels are normal. Large posterior communicating artery on the right gives most of the supply to the right PCA. IMPRESSION: Normal MRI of the brain. Normal intracranial MR angiography. Electronically Signed   By: Nelson Chimes M.D.   On:  01/18/2015 17:24   I have personally reviewed and evaluated these images and lab results as part of my medical decision-making.   EKG Interpretation   Date/Time:  Wednesday January 18 2015 13:33:47 EST Ventricular Rate:  56 PR Interval:  192 QRS Duration: 90 QT Interval:  420 QTC Calculation: 405 R Axis:   57 Text Interpretation:  Sinus rhythm Probable left atrial enlargement  Minimal ST depression, inferior leads Sinus rhythm Artifact Borderline ECG  Confirmed by Carmin Muskrat  MD ([id number]) on 01/18/2015 2:14:02 PM     Pulse oximetry 99% room air normal Cardiac 70 sinus normal   Update: After CT results were obtained, reassessed the patient. He continues to complain of dysesthesia in the left face, right head pain. With persistent hypertension, aneurysm, stroke considerations, MRI performed.  6:15 PM MRI results discussed with the patient and multiple family members. We also discussed the patient's hypertension. Patient notes that he was on antihypertensives 2 years ago, has not taken any since that time. No new complaints We discussed the need for initiation of new antihypertensives for presumed hypertensive urgency, outpatient follow-up within 1 week to ensure improvement in his condition, resolution of his hypertension.  MDM   Final diagnoses:  Numbness  Hypertensive urgency   Patient presents with at least 3 days of ongoing dysesthesia in the left face, right headache. Here, the patient is hypertensive, objectively neurologically unremarkable aside from possible mild weakness in the left lower extremity. Facial asymmetry, no speech deficit, but there is concern for stroke versus aneurysm. Studies here reassuring, patient had no new complaints, remained hemodynamically similar throughout his emergency room course. Patient started on new antihypertensives regimen, will follow-up with primary care for close outpatient management.  Carmin Muskrat, MD 01/18/15 [phone]

## 2015-01-18 NOTE — Discharge Instructions (Signed)
As discussed, your evaluation today has been largely reassuring.  But, it is important that you monitor your condition carefully, and do not hesitate to return to the ED if you develop new, or concerning changes in your condition.  With evidence for ongoing hypertension it is important to take the newly prescribed medication daily.  Please be sure to make a physician's appointment in less than one week for repeat evaluation.

## 2015-01-18 NOTE — ED Notes (Signed)
MD at bedside. 

## 2015-01-18 NOTE — Telephone Encounter (Signed)
°  Patient Name: Patrick Harris  DOB: [birth year]    Initial Comment Caller states bp 160/74, numbness and tingling on left side of face   Nurse Assessment  Nurse: Roosvelt Maser, RN, Barnetta Chapel Date/Time (Eastern Time): 01/18/2015 11:26:38 AM  Confirm and document reason for call. If symptomatic, describe symptoms. ---caller states he has had some numbness and tingling to the left side of his face for two days. numbness is from forehead to chin. he denies weakness in his face. calling today because his blood pressure is a little elevated 160/74. has a slight headache.  Has the patient traveled out of the country within the last 30 days? ---Not Applicable  Does the patient have any new or worsening symptoms? ---Yes  Will a triage be completed? ---Yes  Related visit to physician within the last 2 weeks? ---No  Does the PT have any chronic conditions? (i.e. diabetes, asthma, etc.) ---Unknown  Is this a behavioral health or substance abuse call? ---No     Guidelines    Guideline Title Affirmed Question Affirmed Notes  Neurologic Deficit Headache (and neurologic deficit)    Final Disposition User   Go to ED Now Roosvelt Maser, RN, Barnetta Chapel    Referrals  GO TO FACILITY OTHER - SPECIFY   Disagree/Comply: Comply

## 2015-01-24 ENCOUNTER — Encounter: Payer: Self-pay | Admitting: Internal Medicine

## 2015-01-24 ENCOUNTER — Ambulatory Visit (INDEPENDENT_AMBULATORY_CARE_PROVIDER_SITE_OTHER): Payer: Medicare Other | Admitting: Internal Medicine

## 2015-01-24 VITALS — BP 162/78 | HR 60 | Temp 97.8°F | Resp 16 | Ht 72.0 in | Wt 196.0 lb

## 2015-01-24 DIAGNOSIS — R202 Paresthesia of skin: Secondary | ICD-10-CM | POA: Diagnosis not present

## 2015-01-24 DIAGNOSIS — I10 Essential (primary) hypertension: Secondary | ICD-10-CM | POA: Diagnosis not present

## 2015-01-24 MED ORDER — ASPIRIN EC 81 MG PO TBEC: 81.0000 mg | DELAYED_RELEASE_TABLET | Freq: Every day | ORAL | Status: DC

## 2015-01-24 MED ORDER — VALSARTAN 40 MG PO TABS: 40.0000 mg | ORAL_TABLET | Freq: Every day | ORAL | Status: DC

## 2015-01-24 NOTE — Patient Instructions (Addendum)
  We have reviewed your prior records including labs and tests today.  Test(s) ordered today. Your results will be released to [facility] (or called to you) after review, usually within 72hours after test completion. If any changes need to be made, you will be notified at that same time.   Medications reviewed and updated.  Changes include adding valsartan to your  Regimen to better control your BP.  If your BP is not well controlled at home or if the tingling/numbness of your face does not improve please let me know.   Your prescription(s) have been submitted to your pharmacy. Please take as directed and contact our office if you believe you are having problem(s) with the medication(s).

## 2015-01-24 NOTE — Assessment & Plan Note (Signed)
Left-sided face Improved Imaging including MRI of head and neck and MRI of brain all normal Will continue to monitor for now and see if it improves as his blood pressure improves If the tingling does not improve may need further evaluation

## 2015-01-24 NOTE — Assessment & Plan Note (Signed)
Blood pressure elevated here today and has still been slightly elevated at home Continue hydrochlorothiazide 25 mg daily Add valsartan 40 mg daily-we'll titrate as needed He will continue to monitor blood pressure at home and update me on the numbers he is getting Low-sodium diet Regular exercise Check CMP in 1 week

## 2015-01-24 NOTE — Progress Notes (Signed)
Pre visit review using our clinic review tool, if applicable. No additional management support is needed unless otherwise documented below in the visit note. 

## 2015-01-24 NOTE — Progress Notes (Signed)
Subjective:    Patient ID: Patrick Harris, male    DOB: 06-04-1945, 70 y.o.   MRN: [id number]  HPI He is here to establish with a new pcp.   He is here for follow up from the ED.   He went to the ED on 1/11 for elevated blood pressure.    He was on amlodipine up until 2014 but was taken off due to edema.  His BP was ok for a while.  He had a cold last month and that cleared up with otc cold medications. He started to have tingling in his face and started to check his BP.  He checked it a few times and it was 170-180/70.  He ended up going to the ED.  Tests were done and they were normal.  He has numbness/warm feeling on the left side of his face-has improved, but is still slightly better.  He was placed on hctz, which he is taking daily without side effects.    His blood pressure at home has been better controlled, but still had a couple of 622W systolically.  She denies any point experiencing chest pain, palpitations, shortness of breath, leg swelling, headaches, lightheadedness or weakness in his arms or legs.  We reviewed all of the imaging that her dentist in the emergency room including CT of the head without contrast, chest x-ray, MRI of the brain without contrast and MRA of the brain and neck. All of the above was normal.  Last night he had pain in his left leg.  He does have varicose veins.  He also experiences intermittent pain on the right side of his head, which is not new. He denies any difficulty swallowing or speech difficulty. He currently denies cold symptoms.   Medications and allergies reviewed with patient and updated if appropriate.  Patient Active Problem List   Diagnosis Date Noted  . Family history of prostate cancer 08/19/2014  . OSTEOPENIA 08/07/2009  . Other hemoglobinopathies ([place]) 02/10/2008  . Hyperlipidemia 08/12/2007  . PITUITARY INSUFFICIENCY 08/05/2007  . Testosterone deficiency 08/05/2007  . CAD 08/05/2007  . GERD 08/05/2007  . HYPERPLASIA PROSTATE  UNS W/O UR OBST & OTH LUTS 08/05/2007  . Elevated blood pressure reading without diagnosis of hypertension 07/28/2007    Current Outpatient Prescriptions on File Prior to Visit  Medication Sig Dispense Refill  . ASTRAGALUS PO Take 500 mg by mouth daily.      . bromocriptine (PARLODEL) 2.5 MG tablet Take 5 mg by mouth daily. 2 BY MOUTH DAILY    . Calcium-Magnesium-Zinc (CAL-MAG-ZINC PO) Take by mouth daily.      . Cholecalciferol (VITAMIN D3) 2000 UNITS TABS Take by mouth. 2000 iu in the am, 1000 iu in the pm    . Coenzyme Q10 (COQ10) 100 MG CAPS Take by mouth daily.      Marland Kitchen CRANBERRY PO Take by mouth. 1 tablespoon daily     . Flaxseed, Linseed, (FLAX SEED OIL PO) Take by mouth.      . hydrochlorothiazide (HYDRODIURIL) 25 MG tablet Take 1 tablet (25 mg total) by mouth daily. 30 tablet 0  . Iodine, Kelp, (KELP PO) Take by mouth daily.    Marland Kitchen L-Arginine 500 MG CAPS Take by mouth daily.      . Omega-3 Fatty Acids (FISH OIL) 1000 MG CAPS Take by mouth daily.      Marland Kitchen omeprazole (PRILOSEC OTC) 20 MG tablet Take 1 tablet (20 mg total) by mouth daily. 90 tablet 3  .  Pomegranate Seed 80 % OIL by Does not apply route. 1 table spoon a day     . Potassium 99 MG TABS Take by mouth daily.      . Probiotic Product (PROBIOTIC DAILY PO) Take by mouth daily.    . rosuvastatin (CRESTOR) 20 MG tablet Take 1 tablet (20 mg total) by mouth daily. (Patient taking differently: Take 10 mg by mouth daily. ) 90 tablet 3  . tadalafil (CIALIS) 5 MG tablet Take 5 mg by mouth daily.      . tamsulosin (FLOMAX) 0.4 MG CAPS capsule Take 1 capsule (0.4 mg total) by mouth daily. 90 capsule 3  . testosterone (ANDROGEL) 50 MG/5GM GEL Place onto the skin daily.      . Turmeric 500 MG CAPS Take by mouth.       No current facility-administered medications on file prior to visit.    Past Medical History  Diagnosis Date  . Hypertension   . Hypopituitarism ([place])     hypofunction, Dr.Dennish Kohut  . Testosterone deficiency     Dr  Wilson Singer  . Peyronie disease   . Hyperlipidemia   . Elevated glycated hemoglobin   . GERD (gastroesophageal reflux disease)   . Osteopenia     Dr Wilson Singer  . CAD (coronary artery disease)     Past Surgical History  Procedure Laterality Date  . Fractured calcaneus  1969    bilaterally w/ ankle fusion  . Hernia repair  1984  . Shoulder surgery  1981    left  . Coronary artery bypass graft  2001    2 vessels  . Bone spur  2006    5th toe bilaterally  . Menisectomy  2007    R medial, Dr. Lorin Mercy  . Colonoscopy  2007    negative; Dr Sharlett Iles  . Upper gastrointestinal endoscopy  2007    GERD, Dr Sharlett Iles    Social History   Social History  . Marital Status: Married    Spouse Name: N/A  . Number of Children: N/A  . Years of Education: N/A   Social History Main Topics  . Smoking status: Former Smoker    Quit date: 01/07/1998  . Smokeless tobacco: None     Comment: smoked 1963- 2000, up to 1 ppd  . Alcohol Use: No  . Drug Use: No  . Sexual Activity: Not Asked   Other Topics Concern  . None   Social History Narrative    Family History  Problem Relation Age of Onset  . Heart attack Father 18  . Diabetes Mother   . Stroke Mother 32  . Hypertension Mother   . Leukemia Sister   . Colon cancer Paternal Uncle   . Aortic aneurysm Brother     abdominal  . Cancer Maternal Uncle      X3:prostate , renal, bone     Review of Systems  Constitutional: Negative for fever and chills.  HENT: Negative for congestion, ear pain, sinus pressure, sore throat and trouble swallowing.   Respiratory: Negative for cough, shortness of breath and wheezing.   Cardiovascular: Negative for chest pain, palpitations and leg swelling.  Neurological: Positive for numbness (slight tingling left side of face). Negative for dizziness, speech difficulty, weakness, light-headedness and headaches.       Objective:   Filed Vitals:   01/24/15 1345  BP: 162/78  Pulse: 60  Temp: 97.8 F (36.6 C)    Resp: 16   Filed Weights   01/24/15 1345  Weight:  196 lb (88.905 kg)   Body mass index is 26.58 kg/(m^2).   Physical Exam Constitutional: Appears well-developed and well-nourished. No distress.  Neck: Neck supple. No tracheal deviation present. No thyromegaly present.  No carotid bruit. No cervical adenopathy.   Cardiovascular: Normal rate, regular rhythm and normal heart sounds.   No murmur heard.  No edema, varicose veins b/l LE Pulmonary/Chest: Effort normal and breath sounds normal. No respiratory distress. No wheezes.       Assessment & Plan:   Hospital records reviewed with the patient  See problem list for assessment and plan

## 2015-01-25 ENCOUNTER — Ambulatory Visit (INDEPENDENT_AMBULATORY_CARE_PROVIDER_SITE_OTHER): Payer: Medicare Other | Admitting: Nurse Practitioner

## 2015-01-25 ENCOUNTER — Encounter: Payer: Self-pay | Admitting: Nurse Practitioner

## 2015-01-25 VITALS — BP 154/70 | HR 48 | Temp 98.0°F | Ht 72.0 in | Wt 191.5 lb

## 2015-01-25 DIAGNOSIS — R202 Paresthesia of skin: Secondary | ICD-10-CM

## 2015-01-25 MED ORDER — VALACYCLOVIR HCL 1 G PO TABS: 1000.0000 mg | ORAL_TABLET | Freq: Two times a day (BID) | ORAL | Status: DC

## 2015-01-25 NOTE — Progress Notes (Signed)
Patient ID: Patrick Harris, male    DOB: 25-May-1945  Age: 70 y.o. MRN: [id number]  CC: Mouth Lesions   HPI SOVEREIGN RAMIRO presents for CC of sore on lip x 1 day.   1) Noticed at dinner a sore spot on left mouth  Started as red and swollen Amber crust around lesion currently  Pt had paresthesias of left side of face and went to ER on 01/18/15, feels warm occasionally, but not warm to touch  MRI and MRA in the hospital were negative and his neuro exam was unremarkable  Pt has had shingles before and feels this is similar in sensation    History [name] has a past medical history of Hypertension; Hypopituitarism ([employer]); Testosterone deficiency; Peyronie disease; Hyperlipidemia; Elevated glycated hemoglobin; GERD (gastroesophageal reflux disease); Osteopenia; and CAD (coronary artery disease).   He has past surgical history that includes Fractured calcaneus (1969); Hernia repair (1984); Shoulder surgery (1981); Coronary artery bypass graft (2001); bone spur (2006); Menisectomy (2007); Colonoscopy (2007); and Upper gastrointestinal endoscopy (2007).   His family history includes Aortic aneurysm in his brother; Cancer in his maternal uncle; Colon cancer in his paternal uncle; Diabetes in his mother; Heart attack (age of onset: 1) in his father; Hypertension in his mother; Leukemia in his sister; Stroke (age of onset: 31) in his mother.He reports that he quit smoking about 17 years ago. He does not have any smokeless tobacco history on file. He reports that he does not drink alcohol or use illicit drugs.  Outpatient Prescriptions Prior to Visit  Medication Sig Dispense Refill  . aspirin EC 81 MG tablet Take 1 tablet (81 mg total) by mouth daily.    . ASTRAGALUS PO Take 500 mg by mouth daily.      . bromocriptine (PARLODEL) 2.5 MG tablet Take 5 mg by mouth daily. 2 BY MOUTH DAILY    . Calcium-Magnesium-Zinc (CAL-MAG-ZINC PO) Take by mouth daily.      . Cholecalciferol (VITAMIN D3) 2000 UNITS  TABS Take by mouth. 2000 iu in the am, 1000 iu in the pm    . Coenzyme Q10 (COQ10) 100 MG CAPS Take by mouth daily.      Marland Kitchen CRANBERRY PO Take by mouth. 1 tablespoon daily     . Flaxseed, Linseed, (FLAX SEED OIL PO) Take by mouth.      . Garlic 7209 MG CAPS Take 1,000 mg by mouth 2 (two) times daily.    . hydrochlorothiazide (HYDRODIURIL) 25 MG tablet Take 1 tablet (25 mg total) by mouth daily. 30 tablet 0  . Iodine, Kelp, (KELP PO) Take by mouth daily.    Marland Kitchen L-Arginine 500 MG CAPS Take by mouth daily.      . Omega-3 Fatty Acids (FISH OIL) 1000 MG CAPS Take by mouth daily.      Marland Kitchen omeprazole (PRILOSEC OTC) 20 MG tablet Take 1 tablet (20 mg total) by mouth daily. 90 tablet 3  . Pomegranate Seed 80 % OIL by Does not apply route. 1 table spoon a day     . Potassium 99 MG TABS Take by mouth daily.      . Probiotic Product (PROBIOTIC DAILY PO) Take by mouth daily.    . rosuvastatin (CRESTOR) 20 MG tablet Take 1 tablet (20 mg total) by mouth daily. (Patient taking differently: Take 10 mg by mouth daily. ) 90 tablet 3  . tadalafil (CIALIS) 5 MG tablet Take 5 mg by mouth daily.      . tamsulosin (FLOMAX)  0.4 MG CAPS capsule Take 1 capsule (0.4 mg total) by mouth daily. 90 capsule 3  . testosterone (ANDROGEL) 50 MG/5GM GEL Place onto the skin daily.      . Turmeric 500 MG CAPS Take by mouth.      . vitamin C (ASCORBIC ACID) 500 MG tablet Take 500 mg by mouth 2 (two) times daily.    . valsartan (DIOVAN) 40 MG tablet Take 1 tablet (40 mg total) by mouth daily. (Patient not taking: Reported on 01/25/2015) 90 tablet 1   No facility-administered medications prior to visit.    ROS Review of Systems  Constitutional: Negative for fever, chills, diaphoresis and fatigue.  Respiratory: Negative for chest tightness, shortness of breath and wheezing.   Cardiovascular: Negative for chest pain, palpitations and leg swelling.  Skin: Positive for color change and wound. Negative for rash.       Left lower lip    Neurological: Positive for numbness. Negative for dizziness.    Objective:  BP 154/70 mmHg  Pulse 48  Temp(Src) 98 F (36.7 C) (Oral)  Ht 6' (1.829 m)  Wt 191 lb 8 oz (86.864 kg)  BMI 25.97 kg/m2  SpO2 94%  Physical Exam  Constitutional: He is oriented to person, place, and time. He appears well-developed and well-nourished. No distress.  HENT:  Head: Normocephalic and atraumatic.    Right Ear: External ear normal.  Left Ear: External ear normal.  Mouth/Throat: No oropharyngeal exudate.    2 mm erythematous, crusted, non-vesicular at this time  Red line shows areas of paresthesia  Eyes: Conjunctivae and EOM are normal. Pupils are equal, round, and reactive to light. Right eye exhibits no discharge. Left eye exhibits no discharge. No scleral icterus.  Neck: Normal range of motion. Neck supple.  Cardiovascular: Regular rhythm.   Pulmonary/Chest: Effort normal and breath sounds normal.  Lymphadenopathy:    He has no cervical adenopathy.  Neurological: He is alert and oriented to person, place, and time. He displays normal reflexes. No cranial nerve deficit. He exhibits normal muscle tone. Coordination normal.  Skin: Skin is warm and dry. No rash noted. He is not diaphoretic.  Psychiatric: He has a normal mood and affect. His behavior is normal. Judgment and thought content normal.   Assessment & Plan:   Ramey was seen today for mouth lesions.  Diagnoses and all orders for this visit:  Tingling  Other orders -     valACYclovir (VALTREX) 1000 MG tablet; Take 1 tablet (1,000 mg total) by mouth 2 (two) times daily.  I have discontinued Mr. Bodmer valsartan. I am also having him start on valACYclovir. Additionally, I am having him maintain his testosterone, (Flaxseed, Linseed, (FLAX SEED OIL PO)), L-Arginine, Potassium, Turmeric, bromocriptine, Vitamin D3, CoQ10, tadalafil, Fish Oil, ASTRAGALUS PO, Calcium-Magnesium-Zinc (CAL-MAG-ZINC PO), Pomegranate Seed, CRANBERRY PO,  Probiotic Product (PROBIOTIC DAILY PO), (Iodine, Kelp, (KELP PO)), omeprazole, tamsulosin, rosuvastatin, hydrochlorothiazide, Garlic, vitamin C, and aspirin EC.  Meds ordered this encounter  Medications  . valACYclovir (VALTREX) 1000 MG tablet    Sig: Take 1 tablet (1,000 mg total) by mouth 2 (two) times daily.    Dispense:  14 tablet    Refill:  0    Order Specific Question:  Supervising Provider    Answer:  Crecencio Mc [[id number]]     Follow-up: Return if symptoms worsen or fail to improve.

## 2015-01-25 NOTE — Patient Instructions (Signed)
Watch your blood pressure!   Valtrex sent to pharmacy  Call us if anything changes

## 2015-01-25 NOTE — Progress Notes (Signed)
Pre visit review using our clinic review tool, if applicable. No additional management support is needed unless otherwise documented below in the visit note. 

## 2015-01-25 NOTE — Assessment & Plan Note (Signed)
No improvement Seen in ED on 01/18/15 and seen by Dr. Quay Burow on 01/24/15 before sore broke out later that evening. MRI and MRA negative BP slowly improving- slow rate today  Will treat with Valtrex, if not helpful may need Neuro eval for possible trigeminal neuralgia.  Both hospital and Dr. Eilleen Kempf notes were personally reviewed and pertinent info in the HPI.

## 2015-02-02 ENCOUNTER — Other Ambulatory Visit (INDEPENDENT_AMBULATORY_CARE_PROVIDER_SITE_OTHER): Payer: Medicare Other

## 2015-02-02 ENCOUNTER — Encounter: Payer: Self-pay | Admitting: Internal Medicine

## 2015-02-02 DIAGNOSIS — I10 Essential (primary) hypertension: Secondary | ICD-10-CM

## 2015-02-02 LAB — COMPREHENSIVE METABOLIC PANEL
ALK PHOS: 54 U/L (ref 39–117)
ALT: 19 U/L (ref 0–53)
AST: 22 U/L (ref 0–37)
Albumin: 4.2 g/dL (ref 3.5–5.2)
BUN: 8 mg/dL (ref 6–23)
CO2: 30 meq/L (ref 19–32)
Calcium: 9.4 mg/dL (ref 8.4–10.5)
Chloride: 101 mEq/L (ref 96–112)
Creatinine, Ser: 0.99 mg/dL (ref 0.40–1.50)
GFR: 79.54 mL/min (ref >60.00)
GLUCOSE: 110 mg/dL — AB (ref 70–99)
POTASSIUM: 4.2 meq/L (ref 3.5–5.1)
SODIUM: 137 meq/L (ref 135–145)
TOTAL PROTEIN: 6.9 g/dL (ref 6.0–8.3)
Total Bilirubin: 0.5 mg/dL (ref 0.2–1.2)

## 2015-08-20 NOTE — Progress Notes (Signed)
Subjective:    Patient ID: Patrick Harris, male    DOB: 26-Feb-1945, 70 y.o.   MRN: [id number]  HPI Here for medicare wellness exam/physical exam.   I have personally reviewed and have noted 1.The patient's medical and social history 2.Their use of alcohol, tobacco or illicit drugs 3.Their current medications and supplements 4.The patient's functional ability including ADL's, fall risks, home safety risks and                 hearing or visual impairment. 5.Diet and physical activities 6.Evidence for depression or mood disorders 7.Care team reviewed and updated:  Urology - Dr Louis Meckel,  Endocrine - Dr Wilson Singer   Are there smokers in your home (other than you)? No  Risk Factors Exercise: upper body exercise Dietary issues discussed: well balanced, low sugar, lots of fruits  Cardiac risk factors: advanced age, CAD, hypertension, hyperlipidemia  Depression Screen  Have you felt down, depressed or hopeless? No  Have you felt little interest or pleasure in doing things?  No  Activities of Daily Living In your present state of health, do you have any difficulty performing the following activities?:  Driving? No Managing money?  No Feeding yourself? No Getting from bed to chair? No Climbing a flight of stairs? No Preparing food and eating?: No Bathing or showering? No Getting dressed: No Getting to/using the toilet? No Moving around from place to place: No In the past year have you fallen or had a near fall?: No   Are you sexually active?  Yes  Do you have more than one partner?  No  Hearing Difficulties: No Do you often ask people to speak up or repeat themselves? No Do you experience ringing or noises in your ears? No Do you have difficulty understanding soft or whispered voices? No Vision:              Any change in vision:  No              Up to date with eye exam:  yes  Memory:  Do you feel that you  have a problem with memory? No, some short term memory loss  Do you often misplace items? No  Do you feel safe at home?  Yes  Cognitive Testing  Alert, Orientated? Yes  Normal Appearance? Yes  Recall of three objects?  Yes  Can perform simple calculations? Yes  Displays appropriate judgment? Yes  Can read the correct time from a watch face? Yes   Advanced Directives have been discussed with the patient? Yes   Medications and allergies reviewed with patient and updated if appropriate.  Patient Active Problem List   Diagnosis Date Noted  . Vitamin D deficiency 08/21/2015  . Essential hypertension 01/24/2015  . Tingling 01/24/2015  . Family history of prostate cancer 08/19/2014  . Osteopenia 08/07/2009  . Other hemoglobinopathies ([place]) 02/10/2008  . Hyperlipidemia 08/12/2007  . PITUITARY INSUFFICIENCY 08/05/2007  . Testosterone deficiency 08/05/2007  . Coronary atherosclerosis 08/05/2007  . GERD 08/05/2007  . BPH without obstruction/lower urinary tract symptoms 08/05/2007    Current Outpatient Prescriptions on File Prior to Visit  Medication Sig Dispense Refill  . aspirin EC 81 MG tablet Take 1 tablet (81 mg total) by mouth daily.    . ASTRAGALUS PO Take 500 mg by mouth daily.      . bromocriptine (PARLODEL) 2.5 MG tablet Take 7.5 mg by mouth daily. 2 BY MOUTH DAILY    . Calcium-Magnesium-Zinc (CAL-MAG-ZINC PO) Take  by mouth daily.      . Cholecalciferol (VITAMIN D3) 2000 UNITS TABS Take by mouth. 2000 iu in the am, 1000 iu in the pm    . Coenzyme Q10 (COQ10) 100 MG CAPS Take by mouth daily.      Marland Kitchen CRANBERRY PO Take by mouth. 1 tablespoon daily     . Flaxseed, Linseed, (FLAX SEED OIL PO) Take by mouth.      . Garlic 6503 MG CAPS Take 1,000 mg by mouth 2 (two) times daily.    . Iodine, Kelp, (KELP PO) Take by mouth daily.    Marland Kitchen L-Arginine 500 MG CAPS Take by mouth daily.      . Pomegranate Seed 80 % OIL by Does not apply route. 1 table spoon a day     . Potassium 99 MG TABS  Take by mouth daily.      . Probiotic Product (PROBIOTIC DAILY PO) Take by mouth daily.    . tadalafil (CIALIS) 5 MG tablet Take 5 mg by mouth daily.      Marland Kitchen testosterone (ANDROGEL) 50 MG/5GM GEL Place onto the skin daily.      . Turmeric 500 MG CAPS Take by mouth.      . vitamin C (ASCORBIC ACID) 500 MG tablet Take 500 mg by mouth 2 (two) times daily.     No current facility-administered medications on file prior to visit.     Past Medical History:  Diagnosis Date  . CAD (coronary artery disease)   . Elevated glycated hemoglobin   . GERD (gastroesophageal reflux disease)   . Hyperlipidemia   . Hypertension   . Hypopituitarism ([place])    hypofunction, Dr.Dennish Kohut  . Osteopenia    Dr Wilson Singer  . Peyronie disease   . Testosterone deficiency    Dr Wilson Singer    Past Surgical History:  Procedure Laterality Date  . bone spur  2006   5th toe bilaterally  . COLONOSCOPY  2007   negative; Dr Sharlett Iles  . CORONARY ARTERY BYPASS GRAFT  2001   2 vessels  . Fractured calcaneus  1969   bilaterally w/ ankle fusion  . HERNIA REPAIR  1984  . MENISECTOMY  2007   R medial, Dr. Lorin Mercy  . [place]   left  . UPPER GASTROINTESTINAL ENDOSCOPY  2007   GERD, Dr Sharlett Iles    Social History   Social History  . Marital status: Married    Spouse name: N/A  . Number of children: N/A  . Years of education: N/A   Social History Main Topics  . Smoking status: Former Smoker    Quit date: 01/07/1998  . Smokeless tobacco: None     Comment: smoked 1963- 2000, up to 1 ppd  . Alcohol use No  . Drug use: No  . Sexual activity: Not Asked   Other Topics Concern  . None   Social History Narrative  . None    Family History  Problem Relation Age of Onset  . Heart attack Father 21  . Diabetes Mother   . Stroke Mother 77  . Hypertension Mother   . Leukemia Sister   . Colon cancer Paternal Uncle   . Aortic aneurysm Brother     abdominal  . Cancer Maternal Uncle      X3:prostate ,  renal, bone     Review of Systems  Constitutional: Negative for appetite change, chills, fever and unexpected weight change.  HENT: Negative for hearing loss and tinnitus.  Eyes: Negative for visual disturbance.  Respiratory: Negative for cough, shortness of breath and wheezing.   Cardiovascular: Negative for chest pain, palpitations and leg swelling.  Gastrointestinal: Negative for abdominal pain, blood in stool, diarrhea and nausea.  Genitourinary: Negative for dysuria and hematuria.  Musculoskeletal: Positive for arthralgias (right hip bursitis, knee pain).  Skin: Negative for color change and rash.  Neurological: Negative for dizziness, light-headedness and headaches.  Psychiatric/Behavioral: Negative for dysphoric mood. The patient is not nervous/anxious.        Objective:   Vitals:   08/21/15 0759  BP: 136/74  Pulse: (!) 50  Resp: 16  Temp: 97.9 F (36.6 C)   Filed Weights   08/21/15 0759  Weight: 188 lb (85.3 kg)   Body mass index is 25.5 kg/m.   Physical Exam Constitutional: He appears well-developed and well-nourished. No distress.  HENT:  Head: Normocephalic and atraumatic.  Right Ear: External ear normal.  Left Ear: External ear normal.  Mouth/Throat: Oropharynx is clear and moist.  Normal ear canals and TM b/l  Eyes: Conjunctivae and EOM are normal.  Neck: Neck supple. No tracheal deviation present. No thyromegaly present.  No carotid bruit  Cardiovascular: Normal rate, regular rhythm, normal heart sounds and intact distal pulses.   No murmur heard. Pulmonary/Chest: Effort normal and breath sounds normal. No respiratory distress. He has no wheezes. He has no rales.  Abdominal: Soft. Bowel sounds are normal. He exhibits no distension. There is no tenderness.  Genitourinary: deferred to urology Musculoskeletal: He exhibits no edema.  Lymphadenopathy:    He has no cervical adenopathy.  Skin: Skin is warm and dry. He is not diaphoretic.  Psychiatric: He  has a normal mood and affect. His behavior is normal.        Assessment & Plan:   Wellness Exam: Immunizations - deferred shingles vaccine, others are up to date Colonoscopy - due - will schedule Dexa - done by endocrine in the past, taking calcium, vitamin d Eye exam  Up to date  Hearing loss - none Memory concerns/difficulties none outside of short term memory loss that sounds normal for age.  He will monitor Independent of ADLs - yes, fully Stressed the importance of regular exercise   Patient received copy of preventative screening tests/immunizations recommended for the next 5-10 years.   Physical exam: Screening blood work  ordered Immunizations - deferred shingles, others up to date Colonoscopy - due  - will schedule Eye exams  Up to date  EKG done 6 months ago Exercise - regular, stressed regular exercise Weight - normal BMI - working on weight loss Skin  - no concerns Substance abuse - none   See Problem List for Assessment and Plan of chronic medical problems.

## 2015-08-20 NOTE — Patient Instructions (Addendum)
Patrick Harris , Thank you for taking time to come for your Medicare Wellness Visit. I appreciate your ongoing commitment to your health goals. Please review the following plan we discussed and let me know if I can assist you in the future.   These are the goals we discussed: Goals    None      This is a list of the screening recommended for you and due dates:  Health Maintenance  Topic Date Due  .  Hepatitis C: One time screening is recommended by Center for Disease Control  (CDC) for  adults born from [birth year] through 1965.   12/12/1945  . Colon Cancer Screening  07/16/1995  . Shingles Vaccine  07/15/2005  . Flu Shot  08/08/2015  . Tetanus Vaccine  09/29/2021  . Pneumonia vaccines  Completed    Test(s) ordered today. Your results will be released to [facility] (or called to you) after review, usually within 72hours after test completion. If any changes need to be made, you will be notified at that same time.  All other Health Maintenance issues reviewed.   All recommended immunizations and age-appropriate screenings are up-to-date or discussed.  No immunizations administered today.   Medications reviewed and updated.  Changes include  /  No changes recommended at this time.  Your prescription(s) have been submitted to your pharmacy. Please take as directed and contact our office if you believe you are having problem(s) with the medication(s).  A referral  Please followup in   Health Maintenance, Male A healthy lifestyle and preventative care can promote health and wellness.  Maintain regular health, dental, and eye exams.  Eat a healthy diet. Foods like vegetables, fruits, whole grains, low-fat dairy products, and lean protein foods contain the nutrients you need and are low in calories. Decrease your intake of foods high in solid fats, added sugars, and salt. Get information about a proper diet from your health care provider, if necessary.  Regular physical exercise is one of the  most important things you can do for your health. Most adults should get at least 150 minutes of moderate-intensity exercise (any activity that increases your heart rate and causes you to sweat) each week. In addition, most adults need muscle-strengthening exercises on 2 or more days a week.   Maintain a healthy weight. The body mass index (BMI) is a screening tool to identify possible weight problems. It provides an estimate of body fat based on height and weight. Your health care provider can find your BMI and can help you achieve or maintain a healthy weight. For males 20 years and older:  A BMI below 18.5 is considered underweight.  A BMI of 18.5 to 24.9 is normal.  A BMI of 25 to 29.9 is considered overweight.  A BMI of 30 and above is considered obese.  Maintain normal blood lipids and cholesterol by exercising and minimizing your intake of saturated fat. Eat a balanced diet with plenty of fruits and vegetables. Blood tests for lipids and cholesterol should begin at age 38 and be repeated every 5 years. If your lipid or cholesterol levels are high, you are over age 37, or you are at high risk for heart disease, you may need your cholesterol levels checked more frequently.Ongoing high lipid and cholesterol levels should be treated with medicines if diet and exercise are not working.  If you smoke, find out from your health care provider how to quit. If you do not use tobacco, do not start.  Lung  cancer screening is recommended for adults aged 8-80 years who are at high risk for developing lung cancer because of a history of smoking. A yearly low-dose CT scan of the lungs is recommended for people who have at least a 30-pack-year history of smoking and are current smokers or have quit within the past 15 years. A pack year of smoking is smoking an average of 1 pack of cigarettes a day for 1 year (for example, a 30-pack-year history of smoking could mean smoking 1 pack a day for 30 years or 2  packs a day for 15 years). Yearly screening should continue until the smoker has stopped smoking for at least 15 years. Yearly screening should be stopped for people who develop a health problem that would prevent them from having lung cancer treatment.  If you choose to drink alcohol, do not have more than 2 drinks per day. One drink is considered to be 12 oz (360 mL) of beer, 5 oz (150 mL) of wine, or 1.5 oz (45 mL) of liquor.  Avoid the use of street drugs. Do not share needles with anyone. Ask for help if you need support or instructions about stopping the use of drugs.  High blood pressure causes heart disease and increases the risk of stroke. High blood pressure is more likely to develop in:  People who have blood pressure in the end of the normal range (100-139/85-89 mm Hg).  People who are overweight or obese.  People who are African American.  If you are 6-55 years of age, have your blood pressure checked every 3-5 years. If you are 41 years of age or older, have your blood pressure checked every year. You should have your blood pressure measured twice--once when you are at a hospital or clinic, and once when you are not at a hospital or clinic. Record the average of the two measurements. To check your blood pressure when you are not at a hospital or clinic, you can use:  An automated blood pressure machine at a pharmacy.  A home blood pressure monitor.  If you are 40-29 years old, ask your health care provider if you should take aspirin to prevent heart disease.  Diabetes screening involves taking a blood sample to check your fasting blood sugar level. This should be done once every 3 years after age 18 if you are at a normal weight and without risk factors for diabetes. Testing should be considered at a younger age or be carried out more frequently if you are overweight and have at least 1 risk factor for diabetes.  Colorectal cancer can be detected and often prevented. Most  routine colorectal cancer screening begins at the age of 34 and continues through age 71. However, your health care provider may recommend screening at an earlier age if you have risk factors for colon cancer. On a yearly basis, your health care provider may provide home test kits to check for hidden blood in the stool. A small camera at the end of a tube may be used to directly examine the colon (sigmoidoscopy or colonoscopy) to detect the earliest forms of colorectal cancer. Talk to your health care provider about this at age 96 when routine screening begins. A direct exam of the colon should be repeated every 5-10 years through age 53, unless early forms of precancerous polyps or small growths are found.  People who are at an increased risk for hepatitis B should be screened for this virus. You are considered at  high risk for hepatitis B if:  You were born in a country where hepatitis B occurs often. Talk with your health care provider about which countries are considered high risk.  Your parents were born in a high-risk country and you have not received a shot to protect against hepatitis B (hepatitis B vaccine).  You have HIV or AIDS.  You use needles to inject street drugs.  You live with, or have sex with, someone who has hepatitis B.  You are a man who has sex with other men (MSM).  You get hemodialysis treatment.  You take certain medicines for conditions like cancer, organ transplantation, and autoimmune conditions.  Hepatitis C blood testing is recommended for all people born from [birth year] through 1965 and any individual with known risk factors for hepatitis C.  Healthy men should no longer receive prostate-specific antigen (PSA) blood tests as part of routine cancer screening. Talk to your health care provider about prostate cancer screening.  Testicular cancer screening is not recommended for adolescents or adult males who have no symptoms. Screening includes self-exam, a health care  provider exam, and other screening tests. Consult with your health care provider about any symptoms you have or any concerns you have about testicular cancer.  Practice safe sex. Use condoms and avoid high-risk sexual practices to reduce the spread of sexually transmitted infections (STIs).  You should be screened for STIs, including gonorrhea and chlamydia if:  You are sexually active and are younger than 24 years.  You are older than 24 years, and your health care provider tells you that you are at risk for this type of infection.  Your sexual activity has changed since you were last screened, and you are at an increased risk for chlamydia or gonorrhea. Ask your health care provider if you are at risk.  If you are at risk of being infected with HIV, it is recommended that you take a prescription medicine daily to prevent HIV infection. This is called pre-exposure prophylaxis (PrEP). You are considered at risk if:  You are a man who has sex with other men (MSM).  You are a heterosexual man who is sexually active with multiple partners.  You take drugs by injection.  You are sexually active with a partner who has HIV.  Talk with your health care provider about whether you are at high risk of being infected with HIV. If you choose to begin PrEP, you should first be tested for HIV. You should then be tested every 3 months for as long as you are taking PrEP.  Use sunscreen. Apply sunscreen liberally and repeatedly throughout the day. You should seek shade when your shadow is shorter than you. Protect yourself by wearing long sleeves, pants, a wide-brimmed hat, and sunglasses year round whenever you are outdoors.  Tell your health care provider of new moles or changes in moles, especially if there is a change in shape or color. Also, tell your health care provider if a mole is larger than the size of a pencil eraser.  A one-time screening for abdominal aortic aneurysm (AAA) and surgical  repair of large AAAs by ultrasound is recommended for men aged 6-75 years who are current or former smokers.  Stay current with your vaccines (immunizations).   This information is not intended to replace advice given to you by your health care provider. Make sure you discuss any questions you have with your health care provider.   Document Released: 06/22/2007 Document Revised: 01/14/2014 Document Reviewed:  05/21/2010 Elsevier Interactive Patient Education 2016 [employer].  Patrick Harris , Thank you for taking time to come for your Medicare Wellness Visit. I appreciate your ongoing commitment to your health goals. Please review the following plan we discussed and let me know if I can assist you in the future.   These are the goals we discussed: Goals    None      This is a list of the screening recommended for you and due dates:  Health Maintenance  Topic Date Due  .  Hepatitis C: One time screening is recommended by Center for Disease Control  (CDC) for  adults born from [birth year] through 1965.   Today   . Colon Cancer Screening  - will schedule 07/16/1995  . Shingles Vaccine  --  deferred 07/15/2005  . Flu Shot  08/08/2015  . Tetanus Vaccine  09/29/2021  . Pneumonia vaccines  Completed     Test(s) ordered today. Your results will be released to [facility] (or called to you) after review, usually within 72hours after test completion. If any changes need to be made, you will be notified at that same time.  All other Health Maintenance issues reviewed.   All recommended immunizations and age-appropriate screenings are up-to-date or discussed.  No immunizations administered today.   Medications reviewed and updated.  No changes recommended at this time.  Your prescription(s) have been submitted to your pharmacy. Please take as directed and contact our office if you believe you are having problem(s) with the medication(s).   Please followup in one year  Health Maintenance, Male A  healthy lifestyle and preventative care can promote health and wellness.  Maintain regular health, dental, and eye exams.  Eat a healthy diet. Foods like vegetables, fruits, whole grains, low-fat dairy products, and lean protein foods contain the nutrients you need and are low in calories. Decrease your intake of foods high in solid fats, added sugars, and salt. Get information about a proper diet from your health care provider, if necessary.  Regular physical exercise is one of the most important things you can do for your health. Most adults should get at least 150 minutes of moderate-intensity exercise (any activity that increases your heart rate and causes you to sweat) each week. In addition, most adults need muscle-strengthening exercises on 2 or more days a week.   Maintain a healthy weight. The body mass index (BMI) is a screening tool to identify possible weight problems. It provides an estimate of body fat based on height and weight. Your health care provider can find your BMI and can help you achieve or maintain a healthy weight. For males 20 years and older:  A BMI below 18.5 is considered underweight.  A BMI of 18.5 to 24.9 is normal.  A BMI of 25 to 29.9 is considered overweight.  A BMI of 30 and above is considered obese.  Maintain normal blood lipids and cholesterol by exercising and minimizing your intake of saturated fat. Eat a balanced diet with plenty of fruits and vegetables. Blood tests for lipids and cholesterol should begin at age 49 and be repeated every 5 years. If your lipid or cholesterol levels are high, you are over age 38, or you are at high risk for heart disease, you may need your cholesterol levels checked more frequently.Ongoing high lipid and cholesterol levels should be treated with medicines if diet and exercise are not working.  If you smoke, find out from your health care provider how to quit.  If you do not use tobacco, do not start.  Lung cancer  screening is recommended for adults aged 2-80 years who are at high risk for developing lung cancer because of a history of smoking. A yearly low-dose CT scan of the lungs is recommended for people who have at least a 30-pack-year history of smoking and are current smokers or have quit within the past 15 years. A pack year of smoking is smoking an average of 1 pack of cigarettes a day for 1 year (for example, a 30-pack-year history of smoking could mean smoking 1 pack a day for 30 years or 2 packs a day for 15 years). Yearly screening should continue until the smoker has stopped smoking for at least 15 years. Yearly screening should be stopped for people who develop a health problem that would prevent them from having lung cancer treatment.  If you choose to drink alcohol, do not have more than 2 drinks per day. One drink is considered to be 12 oz (360 mL) of beer, 5 oz (150 mL) of wine, or 1.5 oz (45 mL) of liquor.  Avoid the use of street drugs. Do not share needles with anyone. Ask for help if you need support or instructions about stopping the use of drugs.  High blood pressure causes heart disease and increases the risk of stroke. High blood pressure is more likely to develop in:  People who have blood pressure in the end of the normal range (100-139/85-89 mm Hg).  People who are overweight or obese.  People who are African American.  If you are 28-70 years of age, have your blood pressure checked every 3-5 years. If you are 82 years of age or older, have your blood pressure checked every year. You should have your blood pressure measured twice--once when you are at a hospital or clinic, and once when you are not at a hospital or clinic. Record the average of the two measurements. To check your blood pressure when you are not at a hospital or clinic, you can use:  An automated blood pressure machine at a pharmacy.  A home blood pressure monitor.  If you are 13-87 years old, ask your health  care provider if you should take aspirin to prevent heart disease.  Diabetes screening involves taking a blood sample to check your fasting blood sugar level. This should be done once every 3 years after age 55 if you are at a normal weight and without risk factors for diabetes. Testing should be considered at a younger age or be carried out more frequently if you are overweight and have at least 1 risk factor for diabetes.  Colorectal cancer can be detected and often prevented. Most routine colorectal cancer screening begins at the age of 65 and continues through age 26. However, your health care provider may recommend screening at an earlier age if you have risk factors for colon cancer. On a yearly basis, your health care provider may provide home test kits to check for hidden blood in the stool. A small camera at the end of a tube may be used to directly examine the colon (sigmoidoscopy or colonoscopy) to detect the earliest forms of colorectal cancer. Talk to your health care provider about this at age 54 when routine screening begins. A direct exam of the colon should be repeated every 5-10 years through age 41, unless early forms of precancerous polyps or small growths are found.  People who are at an increased risk for hepatitis  B should be screened for this virus. You are considered at high risk for hepatitis B if:  You were born in a country where hepatitis B occurs often. Talk with your health care provider about which countries are considered high risk.  Your parents were born in a high-risk country and you have not received a shot to protect against hepatitis B (hepatitis B vaccine).  You have HIV or AIDS.  You use needles to inject street drugs.  You live with, or have sex with, someone who has hepatitis B.  You are a man who has sex with other men (MSM).  You get hemodialysis treatment.  You take certain medicines for conditions like cancer, organ transplantation, and autoimmune  conditions.  Hepatitis C blood testing is recommended for all people born from [birth year] through 1965 and any individual with known risk factors for hepatitis C.  Healthy men should no longer receive prostate-specific antigen (PSA) blood tests as part of routine cancer screening. Talk to your health care provider about prostate cancer screening.  Testicular cancer screening is not recommended for adolescents or adult males who have no symptoms. Screening includes self-exam, a health care provider exam, and other screening tests. Consult with your health care provider about any symptoms you have or any concerns you have about testicular cancer.  Practice safe sex. Use condoms and avoid high-risk sexual practices to reduce the spread of sexually transmitted infections (STIs).  You should be screened for STIs, including gonorrhea and chlamydia if:  You are sexually active and are younger than 24 years.  You are older than 24 years, and your health care provider tells you that you are at risk for this type of infection.  Your sexual activity has changed since you were last screened, and you are at an increased risk for chlamydia or gonorrhea. Ask your health care provider if you are at risk.  If you are at risk of being infected with HIV, it is recommended that you take a prescription medicine daily to prevent HIV infection. This is called pre-exposure prophylaxis (PrEP). You are considered at risk if:  You are a man who has sex with other men (MSM).  You are a heterosexual man who is sexually active with multiple partners.  You take drugs by injection.  You are sexually active with a partner who has HIV.  Talk with your health care provider about whether you are at high risk of being infected with HIV. If you choose to begin PrEP, you should first be tested for HIV. You should then be tested every 3 months for as long as you are taking PrEP.  Use sunscreen. Apply sunscreen liberally and  repeatedly throughout the day. You should seek shade when your shadow is shorter than you. Protect yourself by wearing long sleeves, pants, a wide-brimmed hat, and sunglasses year round whenever you are outdoors.  Tell your health care provider of new moles or changes in moles, especially if there is a change in shape or color. Also, tell your health care provider if a mole is larger than the size of a pencil eraser.  A one-time screening for abdominal aortic aneurysm (AAA) and surgical repair of large AAAs by ultrasound is recommended for men aged 23-75 years who are current or former smokers.  Stay current with your vaccines (immunizations).   This information is not intended to replace advice given to you by your health care provider. Make sure you discuss any questions you have with your health care  provider.   Document Released: 06/22/2007 Document Revised: 01/14/2014 Document Reviewed: 05/21/2010 Elsevier Interactive Patient Education [employer].

## 2015-08-21 ENCOUNTER — Other Ambulatory Visit (INDEPENDENT_AMBULATORY_CARE_PROVIDER_SITE_OTHER): Payer: Medicare Other

## 2015-08-21 ENCOUNTER — Encounter: Payer: Self-pay | Admitting: Internal Medicine

## 2015-08-21 ENCOUNTER — Ambulatory Visit (INDEPENDENT_AMBULATORY_CARE_PROVIDER_SITE_OTHER): Payer: Medicare Other | Admitting: Internal Medicine

## 2015-08-21 VITALS — BP 136/74 | HR 50 | Temp 97.9°F | Resp 16 | Ht 72.0 in | Wt 188.0 lb

## 2015-08-21 DIAGNOSIS — Z0000 Encounter for general adult medical examination without abnormal findings: Secondary | ICD-10-CM

## 2015-08-21 DIAGNOSIS — R739 Hyperglycemia, unspecified: Secondary | ICD-10-CM

## 2015-08-21 DIAGNOSIS — I10 Essential (primary) hypertension: Secondary | ICD-10-CM

## 2015-08-21 DIAGNOSIS — M8580 Other specified disorders of bone density and structure, unspecified site: Secondary | ICD-10-CM

## 2015-08-21 DIAGNOSIS — E559 Vitamin D deficiency, unspecified: Secondary | ICD-10-CM

## 2015-08-21 DIAGNOSIS — Z125 Encounter for screening for malignant neoplasm of prostate: Secondary | ICD-10-CM

## 2015-08-21 DIAGNOSIS — K219 Gastro-esophageal reflux disease without esophagitis: Secondary | ICD-10-CM

## 2015-08-21 DIAGNOSIS — E785 Hyperlipidemia, unspecified: Secondary | ICD-10-CM

## 2015-08-21 DIAGNOSIS — I2510 Atherosclerotic heart disease of native coronary artery without angina pectoris: Secondary | ICD-10-CM

## 2015-08-21 DIAGNOSIS — Z1159 Encounter for screening for other viral diseases: Secondary | ICD-10-CM

## 2015-08-21 DIAGNOSIS — N400 Enlarged prostate without lower urinary tract symptoms: Secondary | ICD-10-CM

## 2015-08-21 DIAGNOSIS — E230 Hypopituitarism: Secondary | ICD-10-CM

## 2015-08-21 LAB — CBC WITH DIFFERENTIAL/PLATELET
Basophils Absolute: 0 10*3/uL (ref 0.0–0.1)
Basophils Relative: 0.5 % (ref 0.0–3.0)
EOS ABS: 0.1 10*3/uL (ref 0.0–0.7)
Eosinophils Relative: 1.5 % (ref 0.0–5.0)
HCT: 52.2 % — ABNORMAL HIGH (ref 39.0–52.0)
HEMOGLOBIN: 17.9 g/dL — AB (ref 13.0–17.0)
Lymphocytes Relative: 25.6 % (ref 12.0–46.0)
Lymphs Abs: 1.7 10*3/uL (ref 0.7–4.0)
MCHC: 34.3 g/dL (ref 30.0–36.0)
MCV: 94 fl (ref 78.0–100.0)
MONO ABS: 0.7 10*3/uL (ref 0.1–1.0)
Monocytes Relative: 11 % (ref 3.0–12.0)
NEUTROS PCT: 61.4 % (ref 43.0–77.0)
Neutro Abs: 4.1 10*3/uL (ref 1.4–7.7)
Platelets: 166 10*3/uL (ref 150.0–400.0)
RBC: 5.55 Mil/uL (ref 4.22–5.81)
RDW: 13.6 % (ref 11.5–15.5)
WBC: 6.6 10*3/uL (ref 4.0–10.5)

## 2015-08-21 LAB — LIPID PANEL
Cholesterol: 134 mg/dL (ref 0–200)
HDL: 32.3 mg/dL — AB (ref >39.00)
LDL Cholesterol: 78 mg/dL (ref 0–99)
NONHDL: 101.3
TRIGLYCERIDES: 115 mg/dL (ref 0.0–149.0)
Total CHOL/HDL Ratio: 4
VLDL: 23 mg/dL (ref 0.0–40.0)

## 2015-08-21 LAB — COMPREHENSIVE METABOLIC PANEL
ALBUMIN: 4.6 g/dL (ref 3.5–5.2)
ALK PHOS: 58 U/L (ref 39–117)
ALT: 18 U/L (ref 0–53)
AST: 21 U/L (ref 0–37)
BUN: 14 mg/dL (ref 6–23)
CO2: 29 mEq/L (ref 19–32)
CREATININE: 1 mg/dL (ref 0.40–1.50)
Calcium: 9.6 mg/dL (ref 8.4–10.5)
Chloride: 102 mEq/L (ref 96–112)
GFR: 78.49 mL/min (ref >60.00)
Glucose, Bld: 95 mg/dL (ref 70–99)
Potassium: 4.5 mEq/L (ref 3.5–5.1)
SODIUM: 138 meq/L (ref 135–145)
TOTAL PROTEIN: 7.2 g/dL (ref 6.0–8.3)
Total Bilirubin: 0.9 mg/dL (ref 0.2–1.2)

## 2015-08-21 LAB — PSA, MEDICARE: PSA: 1.54 ng/ml (ref 0.10–4.00)

## 2015-08-21 LAB — HEMOGLOBIN A1C: Hgb A1c MFr Bld: 5.7 % (ref 4.6–6.5)

## 2015-08-21 LAB — TSH: TSH: 1.63 u[IU]/mL (ref 0.35–4.50)

## 2015-08-21 LAB — VITAMIN D 25 HYDROXY (VIT D DEFICIENCY, FRACTURES): VITD: 69.46 ng/mL (ref 30.00–100.00)

## 2015-08-21 MED ORDER — TAMSULOSIN HCL 0.4 MG PO CAPS: 0.4000 mg | ORAL_CAPSULE | Freq: Every day | ORAL | 3 refills | Status: DC

## 2015-08-21 MED ORDER — VALSARTAN 40 MG PO TABS: 40.0000 mg | ORAL_TABLET | Freq: Every day | ORAL | 3 refills | Status: DC

## 2015-08-21 MED ORDER — ROSUVASTATIN CALCIUM 20 MG PO TABS
10.0000 mg | ORAL_TABLET | Freq: Every day | ORAL | 3 refills | Status: DC
Start: 2015-08-21 — End: 2016-08-21

## 2015-08-21 MED ORDER — OMEPRAZOLE MAGNESIUM 20 MG PO TBEC
20.0000 mg | DELAYED_RELEASE_TABLET | Freq: Every day | ORAL | 3 refills | Status: DC
Start: 2015-08-21 — End: 2016-08-15

## 2015-08-21 NOTE — Assessment & Plan Note (Signed)
Check lipids, cmp Continue crestor

## 2015-08-21 NOTE — Progress Notes (Signed)
Pre visit review using our clinic review tool, if applicable. No additional management support is needed unless otherwise documented below in the visit note. 

## 2015-08-21 NOTE — Assessment & Plan Note (Signed)
Follows with endocrine twice a year

## 2015-08-21 NOTE — Assessment & Plan Note (Signed)
Asymptomatic, no chest pain Continue asa, statin

## 2015-08-21 NOTE — Assessment & Plan Note (Signed)
BP well controlled Current regimen effective and well tolerated Continue current medications at current doses  

## 2015-08-21 NOTE — Assessment & Plan Note (Signed)
Taking vit d daily Check level

## 2015-08-21 NOTE — Assessment & Plan Note (Signed)
Follows with urology

## 2015-08-21 NOTE — Assessment & Plan Note (Signed)
GERD controlled Continue daily medication  

## 2015-08-22 LAB — HEPATITIS C ANTIBODY: HCV AB: NEGATIVE

## 2015-08-26 ENCOUNTER — Encounter: Payer: Self-pay | Admitting: Internal Medicine

## 2015-12-12 ENCOUNTER — Other Ambulatory Visit: Payer: Self-pay | Admitting: Internal Medicine

## 2016-04-08 ENCOUNTER — Emergency Department (HOSPITAL_COMMUNITY)
Admission: EM | Admit: 2016-04-08 | Discharge: 2016-04-08 | Disposition: A | Discharge status: Home / Self Care | Payer: Medicare Other | Attending: Dermatology | Admitting: Dermatology

## 2016-04-08 ENCOUNTER — Encounter (HOSPITAL_COMMUNITY): Payer: Self-pay

## 2016-04-08 DIAGNOSIS — I2510 Atherosclerotic heart disease of native coronary artery without angina pectoris: Secondary | ICD-10-CM | POA: Insufficient documentation

## 2016-04-08 DIAGNOSIS — Z87891 Personal history of nicotine dependence: Secondary | ICD-10-CM | POA: Diagnosis not present

## 2016-04-08 DIAGNOSIS — R509 Fever, unspecified: Secondary | ICD-10-CM | POA: Insufficient documentation

## 2016-04-08 DIAGNOSIS — Z79899 Other long term (current) drug therapy: Secondary | ICD-10-CM | POA: Insufficient documentation

## 2016-04-08 DIAGNOSIS — I10 Essential (primary) hypertension: Secondary | ICD-10-CM | POA: Insufficient documentation

## 2016-04-08 DIAGNOSIS — Z5321 Procedure and treatment not carried out due to patient leaving prior to being seen by health care provider: Secondary | ICD-10-CM | POA: Diagnosis not present

## 2016-04-08 DIAGNOSIS — Z7982 Long term (current) use of aspirin: Secondary | ICD-10-CM | POA: Diagnosis not present

## 2016-04-08 DIAGNOSIS — Z951 Presence of aortocoronary bypass graft: Secondary | ICD-10-CM | POA: Diagnosis not present

## 2016-04-08 NOTE — ED Notes (Signed)
Pt gave registration armband and stated he was leaving

## 2016-04-08 NOTE — ED Triage Notes (Signed)
Patient states that he has cold symptoms, started running a fever this evening of 102.  Did not take any medications prior to coming here.  States that he has lower back pain also.

## 2016-05-18 ENCOUNTER — Encounter (HOSPITAL_COMMUNITY): Payer: Self-pay

## 2016-05-18 ENCOUNTER — Emergency Department (HOSPITAL_COMMUNITY)
Admission: EM | Admit: 2016-05-18 | Discharge: 2016-05-18 | Disposition: A | Discharge status: Home / Self Care | Payer: Medicare Other | Attending: Emergency Medicine | Admitting: Emergency Medicine

## 2016-05-18 DIAGNOSIS — Y929 Unspecified place or not applicable: Secondary | ICD-10-CM | POA: Insufficient documentation

## 2016-05-18 DIAGNOSIS — Z87891 Personal history of nicotine dependence: Secondary | ICD-10-CM | POA: Diagnosis not present

## 2016-05-18 DIAGNOSIS — L03114 Cellulitis of left upper limb: Secondary | ICD-10-CM | POA: Diagnosis not present

## 2016-05-18 DIAGNOSIS — I10 Essential (primary) hypertension: Secondary | ICD-10-CM | POA: Diagnosis not present

## 2016-05-18 DIAGNOSIS — Z951 Presence of aortocoronary bypass graft: Secondary | ICD-10-CM | POA: Insufficient documentation

## 2016-05-18 DIAGNOSIS — Y999 Unspecified external cause status: Secondary | ICD-10-CM | POA: Insufficient documentation

## 2016-05-18 DIAGNOSIS — S40862A Insect bite (nonvenomous) of left upper arm, initial encounter: Secondary | ICD-10-CM | POA: Diagnosis not present

## 2016-05-18 DIAGNOSIS — I25810 Atherosclerosis of coronary artery bypass graft(s) without angina pectoris: Secondary | ICD-10-CM | POA: Diagnosis not present

## 2016-05-18 DIAGNOSIS — W57XXXA Bitten or stung by nonvenomous insect and other nonvenomous arthropods, initial encounter: Secondary | ICD-10-CM | POA: Diagnosis not present

## 2016-05-18 DIAGNOSIS — Y939 Activity, unspecified: Secondary | ICD-10-CM | POA: Diagnosis not present

## 2016-05-18 DIAGNOSIS — Z7982 Long term (current) use of aspirin: Secondary | ICD-10-CM | POA: Insufficient documentation

## 2016-05-18 MED ORDER — DOXYCYCLINE HYCLATE 100 MG PO CAPS: 100.0000 mg | ORAL_CAPSULE | Freq: Two times a day (BID) | ORAL | 0 refills | Status: DC

## 2016-05-18 NOTE — ED Notes (Signed)
Pt made aware to return if symptoms worsen or if any life threatening symptoms occur.   

## 2016-05-18 NOTE — ED Provider Notes (Signed)
[facility] DEPT Provider Note   CSN: [id number] Arrival date & time: 05/18/16  6644     History   Chief Complaint Chief Complaint  Patient presents with  . Tick Removal    HPI Patrick Harris is a 71 y.o. male who presents with redness and warmth to left upper extremity that began yesterday after a tick bite on 05/16/16. Patient states that he had been out mowing the lawn on Thursday afternoon and when he went inside that evening he found a tick on his left upper arm. He states that he was able to remove the entire tick and visualized the head in the body. Since then he has noticed a small area of redness and swelling to the left upper arm where the tick bite was. He reports some associated warmth pruritis to the area. He has had multiple ticks before and removed them himself but has never had a reaction to one. He denies any fever, chest pain or difficulty breathing.  The history is provided by the patient.    Past Medical History:  Diagnosis Date  . CAD (coronary artery disease)   . Elevated glycated hemoglobin   . GERD (gastroesophageal reflux disease)   . Hyperlipidemia   . Hypertension   . Hypopituitarism ([place])    hypofunction, Dr.Dennish Kohut  . Osteopenia    Dr Wilson Singer  . Peyronie disease   . Testosterone deficiency    Dr Wilson Singer    Patient Active Problem List   Diagnosis Date Noted  . Vitamin D deficiency 08/21/2015  . Essential hypertension 01/24/2015  . Family history of prostate cancer 08/19/2014  . Osteopenia 08/07/2009  . 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 Hyperlipidemia 08/12/2007  . PITUITARY INSUFFICIENCY 08/05/2007  . Testosterone deficiency 08/05/2007  . Coronary atherosclerosis 08/05/2007  . GERD 08/05/2007  . BPH without obstruction/lower urinary tract symptoms 08/05/2007    Past Surgical History:  Procedure Laterality Date  . bone spur  2006   5th toe bilaterally  . COLONOSCOPY  2007   negative; Dr Sharlett Iles  . CORONARY ARTERY  BYPASS GRAFT  2001   2 vessels  . Fractured calcaneus  1969   bilaterally w/ ankle fusion  . HERNIA REPAIR  1984  . MENISECTOMY  2007   R medial, Dr. Lorin Mercy  . [place]   left  . UPPER GASTROINTESTINAL ENDOSCOPY  2007   GERD, Dr Sharlett Iles       Home Medications    Prior to Admission medications   Medication Sig Start Date End Date Taking? Authorizing Provider  aspirin EC 81 MG tablet Take 1 tablet (81 mg total) by mouth daily. 01/24/15   [name], MD  ASTRAGALUS PO Take 500 mg by mouth daily.      [provider]  bromocriptine (PARLODEL) 2.5 MG tablet Take 7.5 mg by mouth daily. 2 BY MOUTH DAILY    [provider]  Calcium-Magnesium-Zinc (CAL-MAG-ZINC PO) Take by mouth daily.      [provider]  Cholecalciferol (VITAMIN D3) 2000 UNITS TABS Take by mouth. 2000 iu in the am, 1000 iu in the pm    [provider]  Coenzyme Q10 (COQ10) 100 MG CAPS Take by mouth daily.      [provider]  CRANBERRY PO Take by mouth. 1 tablespoon daily     [provider]  doxycycline (VIBRAMYCIN) 100 MG capsule Take 1 capsule (100 mg total) by mouth 2 (two) times daily. 05/18/16   Layden,  Lindsey A, PA-C  Flaxseed, Linseed, (FLAX SEED OIL PO) Take by mouth.      [provider]  Garlic 8413 MG CAPS Take 1,000 mg by mouth 2 (two) times daily.    [provider]  Iodine, Kelp, (KELP PO) Take by mouth daily.    [provider]  L-Arginine 500 MG CAPS Take by mouth daily.      [provider]  omeprazole (PRILOSEC OTC) 20 MG tablet Take 1 tablet (20 mg total) by mouth daily. 08/21/15   [name], MD  Pomegranate Seed 80 % OIL by Does not apply route. 1 table spoon a day     [provider]  Potassium 99 MG TABS Take by mouth daily.      [provider]  Probiotic Product (PROBIOTIC DAILY PO) Take by mouth daily.    [provider]  rosuvastatin (CRESTOR) 20 MG tablet  Take 0.5 tablets (10 mg total) by mouth daily. 08/21/15   [name], MD  tadalafil (CIALIS) 5 MG tablet Take 5 mg by mouth daily.      [provider]  tamsulosin (FLOMAX) 0.4 MG CAPS capsule Take 1 capsule (0.4 mg total) by mouth daily. 08/21/15   [name], MD  testosterone (ANDROGEL) 50 MG/5GM GEL Place onto the skin daily.      [provider]  Turmeric 500 MG CAPS Take by mouth.      [provider]  valsartan (DIOVAN) 40 MG tablet Take 1 tablet (40 mg total) by mouth daily. 08/21/15   [name], MD  vitamin C (ASCORBIC ACID) 500 MG tablet Take 500 mg by mouth 2 (two) times daily.    [provider]    Family History Family History  Problem Relation Age of Onset  . Heart attack Father 75  . Diabetes Mother   . Stroke Mother 64  . Hypertension Mother   . Leukemia Sister   . Colon cancer Paternal Uncle   . Aortic aneurysm Brother        abdominal  . Cancer Maternal Uncle         X3:prostate , renal, bone     Social History Social History  Substance Use Topics  . Smoking status: Former Smoker    Quit date: 01/07/1998  . Smokeless tobacco: Never Used     Comment: smoked 1963- 2000, up to 1 ppd  . Alcohol use No     Allergies   Latex; Amlodipine; and Simvastatin   Review of Systems Review of Systems  Constitutional: Negative for fever.  Respiratory: Negative for shortness of breath.   Cardiovascular: Negative for chest pain.  Skin: Positive for color change and wound.     Physical Exam Updated Vital Signs BP (!) 162/81 (BP Location: Right Arm)   Pulse 61   Temp 97.6 F (36.4 C) (Oral)   Resp 16   Wt 84.4 kg   SpO2 95%   BMI 25.23 kg/m   Physical Exam  Constitutional: He appears well-developed and well-nourished.  HENT:  Head: Normocephalic and atraumatic.  Eyes: Conjunctivae and EOM are normal. Right eye exhibits no discharge. Left eye exhibits no discharge. No scleral icterus.  Cardiovascular:  Pulses:       Radial pulses are 2+ on the right side, and 2+ on the left side.  Pulmonary/Chest: Effort normal.  Musculoskeletal: He exhibits no deformity.  Neurological: He is alert.  Skin: Skin is warm and dry. Capillary refill takes  less than 2 seconds.  Isolated 2.5 cm Area of erythema, induration and warmth. Does not extend distally to left upper extremity. No fluctuance noted. Does not appear like erythema migrans.   Psychiatric: He has a normal mood and affect. His speech is normal and behavior is normal.       ED Treatments / Results  Labs (all labs ordered are listed, but only abnormal results are displayed) Labs Reviewed - No data to display  EKG  EKG Interpretation None       Radiology No results found.  Procedures Procedures (including critical care time)  Medications Ordered in ED Medications - No data to display   Initial Impression / Assessment and Plan / ED Course  I have reviewed the triage vital signs and the nursing notes.  Pertinent labs & imaging results that were available during my care of the patient were reviewed by me and considered in my medical decision making (see chart for details).     71 year old male who presents with redness and swelling to left upper extremity after removing a tick 2 days ago. No history of fever. Physical exam shows isolated 2.5cm area of erythema, induration and warmth. No fluctuance noted. High suspicion of cellulitis given history/physical exam. Area does not appear to be erythema migrans consistent with Lyme disease. Wound was explored and no foreign body was visualized. Will plan to treat for cellulitis. Discussed with pharmacy regarding use of doxycycline to cover for both tick bites and cellulitis. No significant interaction found. Will plan to give doxycycline for coverage. Patient instructed follow-up with his primary care doctor in 2 days for wound evaluation. If he cannot be seen by his primary care doctor and instructed to  return to the emergency Department in 2 days for recheck. Strict return precautions given. Patient specifically understanding and agreement to plan.  Final Clinical Impressions(s) / ED Diagnoses   Final diagnoses:  Tick bite, initial encounter  Cellulitis of left upper extremity    New Prescriptions New Prescriptions   DOXYCYCLINE (VIBRAMYCIN) 100 MG CAPSULE    Take 1 capsule (100 mg total) by mouth 2 (two) times daily.     Volanda Napoleon, PA-C 05/18/16 1043    Mesner, Corene Cornea, MD 05/18/16 1511

## 2016-05-18 NOTE — ED Triage Notes (Signed)
Pt reports pulling deer tick of left upper arm Thursday nightt. Area itches and raised

## 2016-05-18 NOTE — ED Notes (Signed)
Ria Comment, [place] okay with vs at discharge.

## 2016-05-18 NOTE — Discharge Instructions (Signed)
Take antibiotics as prescribed.   Follow-up with your primary care doctor by Monday or Tuesday for wound recheck. If he cannot be seen by her primary care doctor by then return to the emergency department in 2 days for wound recheck.  Return to the emergency department sooner for any increased redness, swelling that extends down her arm, increased warmth, fever, difficult to breathing or any other worsening or concerning symptoms.

## 2016-05-22 ENCOUNTER — Ambulatory Visit (INDEPENDENT_AMBULATORY_CARE_PROVIDER_SITE_OTHER): Payer: Medicare Other | Admitting: Internal Medicine

## 2016-05-22 ENCOUNTER — Encounter: Payer: Self-pay | Admitting: Internal Medicine

## 2016-05-22 VITALS — BP 146/66 | HR 54 | Temp 97.9°F | Resp 16 | Wt 189.0 lb

## 2016-05-22 DIAGNOSIS — I8393 Asymptomatic varicose veins of bilateral lower extremities: Secondary | ICD-10-CM | POA: Diagnosis not present

## 2016-05-22 DIAGNOSIS — I10 Essential (primary) hypertension: Secondary | ICD-10-CM

## 2016-05-22 DIAGNOSIS — E7800 Pure hypercholesterolemia, unspecified: Secondary | ICD-10-CM

## 2016-05-22 DIAGNOSIS — L03114 Cellulitis of left upper limb: Secondary | ICD-10-CM | POA: Insufficient documentation

## 2016-05-22 DIAGNOSIS — Z125 Encounter for screening for malignant neoplasm of prostate: Secondary | ICD-10-CM

## 2016-05-22 DIAGNOSIS — R748 Abnormal levels of other serum enzymes: Secondary | ICD-10-CM

## 2016-05-22 DIAGNOSIS — Z0000 Encounter for general adult medical examination without abnormal findings: Secondary | ICD-10-CM

## 2016-05-22 NOTE — Patient Instructions (Addendum)
Continue and complete your antibiotic.  If your infection does not continue to improve or it worsens please call.

## 2016-05-22 NOTE — Assessment & Plan Note (Signed)
Secondary to a tick bite Cellulitis improving on doxycycline Low risk for Lyme disease-tip was present only several hours Continue and complete doxycycline course He will continue to monitor the area  Discussed tick prevention

## 2016-05-22 NOTE — Assessment & Plan Note (Signed)
Denies edema Assessment tenderness in the left lower extremity varicose vein, no evidence of superficial blood clot Declined referral to vascular surgery

## 2016-05-22 NOTE — Progress Notes (Signed)
Subjective:    Patient ID: Patrick Harris, male    DOB: Oct 15, 1945, 71 y.o.   MRN: [id number]  HPI He is here for follow up from the emergency room.  He went to the emergency room 05/18/16 with redness and warmth in the left upper extremity that started after a tick bite from 5/10. He was mowing the lawn that day in the evening he found a tick on his left upper arm. He was able to remove the entire tick. He noticed a small area of redness and swelling in the left upper arm with associated warmth and pruritus. He denied any fever, chest pain or difficulty breathing.  On exam he had an isolated 2.5 cm area of erythema that was indurated and warm. There is no fluctuance and there did not appear to be erythema migrans.  The tick was on his arm for at most 8 hours. He was diagnosed with a tick bite and cellulitis. He was prescribed doxycycline 100 mg twice daily for 10 days. He is here for follow-up today.  His redness has improved and gotten smaller.  He denies swelling, numbness, tingling, fevers, headaches and shortness of breath. He denies any changes in his joint pain.   Left lower leg aches at night when laying down.  During the day it feels ok when he is up and moving around.  The left lower leg is tingly at times.  He has varicose veins in his legs and that is where he experiences the discomfort. The leg swells minimally.  The right leg does not hurt.    Medications and allergies reviewed with patient and updated if appropriate.  Patient Active Problem List   Diagnosis Date Noted  . Vitamin D deficiency 08/21/2015  . Essential hypertension 01/24/2015  . Family history of prostate cancer 08/19/2014  . Osteopenia 08/07/2009  . Other hemoglobinopathies ([place]) 02/10/2008  . Hyperlipidemia 08/12/2007  . PITUITARY INSUFFICIENCY 08/05/2007  . Testosterone deficiency 08/05/2007  . Coronary atherosclerosis 08/05/2007  . GERD 08/05/2007  . BPH without obstruction/lower urinary tract  symptoms 08/05/2007    Current Outpatient Prescriptions on File Prior to Visit  Medication Sig Dispense Refill  . aspirin EC 81 MG tablet Take 1 tablet (81 mg total) by mouth daily.    . ASTRAGALUS PO Take 500 mg by mouth daily.      . bromocriptine (PARLODEL) 2.5 MG tablet Take 7.5 mg by mouth daily. 2 BY MOUTH DAILY    . Calcium-Magnesium-Zinc (CAL-MAG-ZINC PO) Take by mouth daily.      . Cholecalciferol (VITAMIN D3) 2000 UNITS TABS Take by mouth. 2000 iu in the am, 1000 iu in the pm    . Coenzyme Q10 (COQ10) 100 MG CAPS Take by mouth daily.      Marland Kitchen CRANBERRY PO Take by mouth. 1 tablespoon daily     . doxycycline (VIBRAMYCIN) 100 MG capsule Take 1 capsule (100 mg total) by mouth 2 (two) times daily. 20 capsule 0  . Flaxseed, Linseed, (FLAX SEED OIL PO) Take by mouth.      . Garlic 7867 MG CAPS Take 1,000 mg by mouth 2 (two) times daily.    . Iodine, Kelp, (KELP PO) Take by mouth daily.    Marland Kitchen L-Arginine 500 MG CAPS Take by mouth daily.      Marland Kitchen omeprazole (PRILOSEC OTC) 20 MG tablet Take 1 tablet (20 mg total) by mouth daily. 90 tablet 3  . Pomegranate Seed 80 % OIL by Does not apply  route. 1 table spoon a day     . Potassium 99 MG TABS Take by mouth daily.      . Probiotic Product (PROBIOTIC DAILY PO) Take by mouth daily.    . rosuvastatin (CRESTOR) 20 MG tablet Take 0.5 tablets (10 mg total) by mouth daily. 45 tablet 3  . tadalafil (CIALIS) 5 MG tablet Take 5 mg by mouth daily.      . tamsulosin (FLOMAX) 0.4 MG CAPS capsule Take 1 capsule (0.4 mg total) by mouth daily. 90 capsule 3  . testosterone (ANDROGEL) 50 MG/5GM GEL Place onto the skin daily.      . Turmeric 500 MG CAPS Take by mouth.      . valsartan (DIOVAN) 40 MG tablet Take 1 tablet (40 mg total) by mouth daily. 90 tablet 3  . vitamin C (ASCORBIC ACID) 500 MG tablet Take 500 mg by mouth 2 (two) times daily.     No current facility-administered medications on file prior to visit.     Past Medical History:  Diagnosis Date  .  CAD (coronary artery disease)   . Elevated glycated hemoglobin   . GERD (gastroesophageal reflux disease)   . Hyperlipidemia   . Hypertension   . Hypopituitarism ([place])    hypofunction, Dr.Dennish Kohut  . Osteopenia    Dr Wilson Singer  . Peyronie disease   . Testosterone deficiency    Dr Wilson Singer    Past Surgical History:  Procedure Laterality Date  . bone spur  2006   5th toe bilaterally  . COLONOSCOPY  2007   negative; Dr Sharlett Iles  . CORONARY ARTERY BYPASS GRAFT  2001   2 vessels  . Fractured calcaneus  1969   bilaterally w/ ankle fusion  . HERNIA REPAIR  1984  . MENISECTOMY  2007   R medial, Dr. Lorin Mercy  . [place]   left  . UPPER GASTROINTESTINAL ENDOSCOPY  2007   GERD, Dr Sharlett Iles    Social History   Social History  . Marital status: Married    Spouse name: N/A  . Number of children: N/A  . Years of education: N/A   Social History Main Topics  . Smoking status: Former Smoker    Quit date: 01/07/1998  . Smokeless tobacco: Never Used     Comment: smoked 1963- 2000, up to 1 ppd  . Alcohol use No  . Drug use: No  . Sexual activity: Not Asked   Other Topics Concern  . None   Social History Narrative  . None    Family History  Problem Relation Age of Onset  . Heart attack Father 43  . Diabetes Mother   . Stroke Mother 80  . Hypertension Mother   . Leukemia Sister   . Colon cancer Paternal Uncle   . Aortic aneurysm Brother        abdominal  . Cancer Maternal Uncle         X3:prostate , renal, bone     Review of Systems  Constitutional: Negative for chills, fatigue and fever.  Cardiovascular: Negative for leg swelling.  Musculoskeletal: Positive for arthralgias (Chronic, unchanged).  Skin:       Improved redness left arm, decreased swelling  Neurological: Negative for light-headedness, numbness and headaches.       Objective:   Vitals:   05/22/16 1038  BP: (!) 146/66  Pulse: (!) 54  Resp: 16  Temp: 97.9 F (36.6 C)   Filed  Weights   05/22/16 1038  Weight: 189 lb (85.7 kg)   Body mass index is 25.63 kg/m.  Wt Readings from Last 3 Encounters:  05/22/16 189 lb (85.7 kg)  05/18/16 186 lb (84.4 kg)  04/08/16 186 lb (84.4 kg)     Physical Exam  Constitutional: He appears well-developed and well-nourished. No distress.  HENT:  Head: Normocephalic and atraumatic.  Cardiovascular:  Varicose veins bilateral lower extremities. Area of tenderness left lower extremity overlies varicose veins no superficial blood clot  Musculoskeletal: He exhibits no edema.  Neurological:  Normal sensation left upper extremity  Skin: He is not diaphoretic. There is erythema (Pea-sized erythematous papule left upper arm from tick bite, minimal surrounding erythema, nontender, no swelling).          Assessment & Plan:   See Problem List for Assessment and Plan of chronic medical problems.

## 2016-05-22 NOTE — Assessment & Plan Note (Signed)
Blood pressure slightly elevated here today, has been well-controlled We will continue to monitor at home and keep a record his readings Follow-up at CPE in few months

## 2016-06-10 ENCOUNTER — Telehealth: Payer: Self-pay | Admitting: Gastroenterology

## 2016-06-10 NOTE — Telephone Encounter (Signed)
Patient notified that [name] Plan is out of the office this week. He said that you agreed to see his wife when Dr., Sharlett Iles left as well.  He is notified that it most likely be next week when we get back to him, as [name] Plan is not in the office this week.

## 2016-06-11 NOTE — Telephone Encounter (Signed)
OK to establish with me. Former DRP pt so he can establish with anyone he chooses.

## 2016-06-11 NOTE — Telephone Encounter (Signed)
Patient wants to be seen to have a colonoscopy? Last procedure was 2007 with Dr. Sharlett Iles

## 2016-06-11 NOTE — Telephone Encounter (Signed)
Patient would like a call to discuss this further.

## 2016-06-12 ENCOUNTER — Encounter: Payer: Self-pay | Admitting: Gastroenterology

## 2016-08-12 ENCOUNTER — Other Ambulatory Visit (INDEPENDENT_AMBULATORY_CARE_PROVIDER_SITE_OTHER): Payer: Medicare Other

## 2016-08-12 DIAGNOSIS — Z125 Encounter for screening for malignant neoplasm of prostate: Secondary | ICD-10-CM

## 2016-08-12 DIAGNOSIS — Z0000 Encounter for general adult medical examination without abnormal findings: Secondary | ICD-10-CM | POA: Diagnosis not present

## 2016-08-12 DIAGNOSIS — R748 Abnormal levels of other serum enzymes: Secondary | ICD-10-CM | POA: Diagnosis not present

## 2016-08-12 DIAGNOSIS — E7800 Pure hypercholesterolemia, unspecified: Secondary | ICD-10-CM

## 2016-08-12 DIAGNOSIS — I10 Essential (primary) hypertension: Secondary | ICD-10-CM | POA: Diagnosis not present

## 2016-08-12 LAB — LIPID PANEL
CHOL/HDL RATIO: 4
Cholesterol: 105 mg/dL (ref 0–200)
HDL: 26.3 mg/dL — AB (ref >39.00)
LDL CALC: 58 mg/dL (ref 0–99)
NONHDL: 78.36
TRIGLYCERIDES: 100 mg/dL (ref 0.0–149.0)
VLDL: 20 mg/dL (ref 0.0–40.0)

## 2016-08-12 LAB — CBC WITH DIFFERENTIAL/PLATELET
BASOS ABS: 0.1 10*3/uL (ref 0.0–0.1)
BASOS PCT: 0.9 % (ref 0.0–3.0)
EOS ABS: 0.1 10*3/uL (ref 0.0–0.7)
Eosinophils Relative: 1.3 % (ref 0.0–5.0)
HCT: 50.6 % (ref 39.0–52.0)
Hemoglobin: 16.5 g/dL (ref 13.0–17.0)
LYMPHS ABS: 1.6 10*3/uL (ref 0.7–4.0)
LYMPHS PCT: 27 % (ref 12.0–46.0)
MCHC: 32.7 g/dL (ref 30.0–36.0)
MCV: 91.7 fl (ref 78.0–100.0)
MONO ABS: 0.7 10*3/uL (ref 0.1–1.0)
Monocytes Relative: 12.1 % — ABNORMAL HIGH (ref 3.0–12.0)
NEUTROS ABS: 3.5 10*3/uL (ref 1.4–7.7)
NEUTROS PCT: 58.7 % (ref 43.0–77.0)
PLATELETS: 179 10*3/uL (ref 150.0–400.0)
RBC: 5.52 Mil/uL (ref 4.22–5.81)
RDW: 14.5 % (ref 11.5–15.5)
WBC: 6 10*3/uL (ref 4.0–10.5)

## 2016-08-12 LAB — COMPREHENSIVE METABOLIC PANEL
ALT: 14 U/L (ref 0–53)
AST: 20 U/L (ref 0–37)
Albumin: 3.9 g/dL (ref 3.5–5.2)
Alkaline Phosphatase: 43 U/L (ref 39–117)
BILIRUBIN TOTAL: 0.7 mg/dL (ref 0.2–1.2)
BUN: 11 mg/dL (ref 6–23)
CHLORIDE: 103 meq/L (ref 96–112)
CO2: 27 meq/L (ref 19–32)
CREATININE: 1.01 mg/dL (ref 0.40–1.50)
Calcium: 8.8 mg/dL (ref 8.4–10.5)
GFR: 77.38 mL/min (ref >60.00)
GLUCOSE: 75 mg/dL (ref 70–99)
Potassium: 4.3 mEq/L (ref 3.5–5.1)
SODIUM: 137 meq/L (ref 135–145)
Total Protein: 6.5 g/dL (ref 6.0–8.3)

## 2016-08-12 LAB — TSH: TSH: 1.61 u[IU]/mL (ref 0.35–4.50)

## 2016-08-12 LAB — PSA, MEDICARE: PSA: 1.69 ng/ml (ref 0.10–4.00)

## 2016-08-12 LAB — HEMOGLOBIN A1C: HEMOGLOBIN A1C: 5.9 % (ref 4.6–6.5)

## 2016-08-12 LAB — CK: CK TOTAL: 165 U/L (ref 7–232)

## 2016-08-14 ENCOUNTER — Ambulatory Visit (AMBULATORY_SURGERY_CENTER): Payer: Self-pay

## 2016-08-14 VITALS — Ht 72.0 in | Wt 191.2 lb

## 2016-08-14 DIAGNOSIS — Z1211 Encounter for screening for malignant neoplasm of colon: Secondary | ICD-10-CM

## 2016-08-14 MED ORDER — NA SULFATE-K SULFATE-MG SULF 17.5-3.13-1.6 GM/177ML PO SOLN: ORAL | 0 refills | Status: DC

## 2016-08-14 NOTE — Progress Notes (Signed)
Per pt, no allergies to soy or egg products.Pt not taking any weight loss meds or using  O2 at home.   Pt refused Emmi video. 

## 2016-08-15 ENCOUNTER — Encounter: Payer: Self-pay | Admitting: Gastroenterology

## 2016-08-15 ENCOUNTER — Other Ambulatory Visit: Payer: Self-pay | Admitting: Internal Medicine

## 2016-08-20 NOTE — Patient Instructions (Addendum)
[name] , Thank you for taking time to come for your Medicare Wellness Visit. I appreciate your ongoing commitment to your health goals. Please review the following plan we discussed and let me know if I can assist you in the future.   These are the goals we discussed: Goals    None      This is a list of the screening recommended for you and due dates:  Health Maintenance  Topic Date Due  . Colon Cancer Screening  07/16/1995  . Flu Shot  08/07/2016  . Tetanus Vaccine  09/29/2021  .  Hepatitis C: One time screening is recommended by Center for Disease Control  (CDC) for  adults born from [birth year] through 1965.   Completed  . Pneumonia vaccines  Completed     We reviewed your blood work today  All other Health Maintenance issues reviewed.   All recommended immunizations and age-appropriate screenings are up-to-date or discussed.  No immunizations administered today. Consider gettting the new shingles vaccine.  Medications reviewed and updated.   No changes recommended at this time.  Your prescription(s) have been submitted to your pharmacy. Please take as directed and contact our office if you believe you are having problem(s) with the medication(s).   Please followup in one year for a physical    Health Maintenance, Male A healthy lifestyle and preventive care is important for your health and wellness. Ask your health care provider about what schedule of regular examinations is right for you. What should I know about weight and diet? Eat a Healthy Diet  Eat plenty of vegetables, fruits, whole grains, low-fat dairy products, and lean protein.  Do not eat a lot of foods high in solid fats, added sugars, or salt.  Maintain a Healthy Weight Regular exercise can help you achieve or maintain a healthy weight. You should:  Do at least 150 minutes of exercise each week. The exercise should increase your heart rate and make you sweat (moderate-intensity exercise).  Do  strength-training exercises at least twice a week.  Watch Your Levels of Cholesterol and Blood Lipids  Have your blood tested for lipids and cholesterol every 5 years starting at 71 years of age. If you are at high risk for heart disease, you should start having your blood tested when you are 71 years old. You may need to have your cholesterol levels checked more often if: ? Your lipid or cholesterol levels are high. ? You are older than 71 years of age. ? You are at high risk for heart disease.  What should I know about cancer screening? Many types of cancers can be detected early and may often be prevented. Lung Cancer  You should be screened every year for lung cancer if: ? You are a current smoker who has smoked for at least 30 years. ? You are a former smoker who has quit within the past 15 years.  Talk to your health care provider about your screening options, when you should start screening, and how often you should be screened.  Colorectal Cancer  Routine colorectal cancer screening usually begins at 71 years of age and should be repeated every 5-10 years until you are 71 years old. You may need to be screened more often if early forms of precancerous polyps or small growths are found. Your health care provider may recommend screening at an earlier age if you have risk factors for colon cancer.  Your health care provider may recommend using home test kits  to check for hidden blood in the stool.  A small camera at the end of a tube can be used to examine your colon (sigmoidoscopy or colonoscopy). This checks for the earliest forms of colorectal cancer.  Prostate and Testicular Cancer  Depending on your age and overall health, your health care provider may do certain tests to screen for prostate and testicular cancer.  Talk to your health care provider about any symptoms or concerns you have about testicular or prostate cancer.  Skin Cancer  Check your skin from head to toe  regularly.  Tell your health care provider about any new moles or changes in moles, especially if: ? There is a change in a mole's size, shape, or color. ? You have a mole that is larger than a pencil eraser.  Always use sunscreen. Apply sunscreen liberally and repeat throughout the day.  Protect yourself by wearing long sleeves, pants, a wide-brimmed hat, and sunglasses when outside.  What should I know about heart disease, diabetes, and high blood pressure?  If you are 27-64 years of age, have your blood pressure checked every 3-5 years. If you are 60 years of age or older, have your blood pressure checked every year. You should have your blood pressure measured twice-once when you are at a hospital or clinic, and once when you are not at a hospital or clinic. Record the average of the two measurements. To check your blood pressure when you are not at a hospital or clinic, you can use: ? An automated blood pressure machine at a pharmacy. ? A home blood pressure monitor.  Talk to your health care provider about your target blood pressure.  If you are between 73-40 years old, ask your health care provider if you should take aspirin to prevent heart disease.  Have regular diabetes screenings by checking your fasting blood sugar level. ? If you are at a normal weight and have a low risk for diabetes, have this test once every three years after the age of 9. ? If you are overweight and have a high risk for diabetes, consider being tested at a younger age or more often.  A one-time screening for abdominal aortic aneurysm (AAA) by ultrasound is recommended for men aged 62-75 years who are current or former smokers. What should I know about preventing infection? Hepatitis B If you have a higher risk for hepatitis B, you should be screened for this virus. Talk with your health care provider to find out if you are at risk for hepatitis B infection. Hepatitis C Blood testing is recommended  for:  Everyone born from [birth year] through 1965.  Anyone with known risk factors for hepatitis C.  Sexually Transmitted Diseases (STDs)  You should be screened each year for STDs including gonorrhea and chlamydia if: ? You are sexually active and are younger than 71 years of age. ? You are older than 71 years of age and your health care provider tells you that you are at risk for this type of infection. ? Your sexual activity has changed since you were last screened and you are at an increased risk for chlamydia or gonorrhea. Ask your health care provider if you are at risk.  Talk with your health care provider about whether you are at high risk of being infected with HIV. Your health care provider may recommend a prescription medicine to help prevent HIV infection.  What else can I do?  Schedule regular health, dental, and eye exams.  Stay  current with your vaccines (immunizations).  Do not use any tobacco products, such as cigarettes, chewing tobacco, and e-cigarettes. If you need help quitting, ask your health care provider.  Limit alcohol intake to no more than 2 drinks per day. One drink equals 12 ounces of beer, 5 ounces of wine, or 1 ounces of hard liquor.  Do not use street drugs.  Do not share needles.  Ask your health care provider for help if you need support or information about quitting drugs.  Tell your health care provider if you often feel depressed.  Tell your health care provider if you have ever been abused or do not feel safe at home. This information is not intended to replace advice given to you by your health care provider. Make sure you discuss any questions you have with your health care provider. Document Released: 06/22/2007 Document Revised: 08/23/2015 Document Reviewed: 09/27/2014 Elsevier Interactive Patient Education  [employer].

## 2016-08-20 NOTE — Progress Notes (Signed)
Subjective:    Patient ID: Patrick Harris, male    DOB: 1945-01-30, 71 y.o.   MRN: [id number]  HPI Here for medicare wellness exam and annual physical exam.   I have personally reviewed and have noted 1.The patient's medical and social history 2.Their use of alcohol, tobacco or illicit drugs 3.Their current medications and supplements 4.The patient's functional ability including ADL's, fall risks, home                 safety risk and hearing or visual impairment. 5.Diet and physical activities 6.Evidence for depression or mood disorders 7.Care team reviewed  - urology - Dr Louis Meckel, Endo - Dr Wilson Singer    Are there smokers in your home (other than you)? No  Risk Factors Exercise: uses bands for arms/push ups 3/week,  Yard work Dietary issues discussed: well balanced, lots of fruits and veges, low sugar  Cardiac risk factors: advanced age, hypertension, hyperlipidemia  Depression Screen  Have you felt down, depressed or hopeless? No  Have you felt little interest or pleasure in doing things?  No  Activities of Daily Living In your present state of health, do you have any difficulty performing the following activities?:  Driving? No Managing money?  No Feeding yourself? No Getting from bed to chair? No Climbing a flight of stairs? No Preparing food and eating?: No Bathing or showering? No Getting dressed: No Getting to/using the toilet? No Moving around from place to place: No In the past year have you fallen or had a near fall?: No    Do you have more than one partner?  No  Hearing Difficulties: No Do you often ask people to speak up or repeat themselves? No Do you experience ringing or noises in your ears? No Do you have difficulty understanding soft or whispered voices? No Vision:              Any change in vision:  No              Up to date with eye exam:   yes  Memory:  Do you feel that you  have a problem with memory? No, except short term that is mild, name recall  Do you often misplace items? No  Do you feel safe at home?  Yes  Cognitive Testing  Alert, Orientated? Yes  Normal Appearance? Yes  Recall of three objects?  Yes  Can perform simple calculations? Yes  Displays appropriate judgment? Yes  Can read the correct time from a watch face? Yes   Advanced Directives have been discussed with the patient? Yes -  In place   Medications and allergies reviewed with patient and updated if appropriate.  Patient Active Problem List   Diagnosis Date Noted  . Varicose veins of both lower extremities 05/22/2016  . Left arm cellulitis 05/22/2016  . Vitamin D deficiency 08/21/2015  . Essential hypertension 01/24/2015  . Family history of prostate cancer 08/19/2014  . Osteopenia 08/07/2009  . 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 Hyperlipidemia 08/12/2007  . PITUITARY INSUFFICIENCY 08/05/2007  . Testosterone deficiency 08/05/2007  . Coronary atherosclerosis 08/05/2007  . GERD 08/05/2007  . BPH without obstruction/lower urinary tract symptoms 08/05/2007    Current Outpatient Prescriptions on File Prior to Visit  Medication Sig Dispense Refill  . aspirin EC 81 MG tablet Take 1 tablet (81 mg total) by mouth daily.    . ASTRAGALUS PO Take 500 mg by mouth daily.      . bromocriptine (  PARLODEL) 2.5 MG tablet Take 7.5 mg by mouth daily.     . Calcium-Magnesium-Zinc (CAL-MAG-ZINC PO) Take by mouth daily.      . Cholecalciferol (VITAMIN D3) 2000 UNITS TABS Take by mouth. 2000 iu in the am, 1000 iu in the pm    . Coenzyme Q10 (COQ10) 100 MG CAPS Take by mouth daily.      Marland Kitchen CRANBERRY PO Take by mouth. 1 tablespoon daily     . diclofenac sodium (VOLTAREN) 1 % GEL Apply topically daily.     . Flaxseed, Linseed, (FLAX SEED OIL PO) Take by mouth.      . Garlic 4967 MG CAPS Take 1,000 mg by mouth 2 (two) times daily.    . Iodine, Kelp, (KELP PO) Take by mouth daily.    Marland Kitchen  L-Arginine 500 MG CAPS Take by mouth daily.      . mirabegron ER (MYRBETRIQ) 50 MG TB24 tablet Take 50 mg by mouth daily.    . Na Sulfate-K Sulfate-Mg Sulf (SUPREP BOWEL PREP KIT) 17.5-3.13-1.6 GM/180ML SOLN Suprep as directed / no substitutions 354 mL 0  . omeprazole (PRILOSEC) 20 MG capsule TAKE 1 CAPSULE EVERY DAY 90 capsule 3  . Pomegranate Seed 80 % OIL by Does not apply route. 1 table spoon a day     . Potassium 99 MG TABS Take by mouth daily.      . Probiotic Product (PROBIOTIC DAILY PO) Take by mouth daily.    . rosuvastatin (CRESTOR) 20 MG tablet Take 0.5 tablets (10 mg total) by mouth daily. 45 tablet 3  . tamsulosin (FLOMAX) 0.4 MG CAPS capsule Take 1 capsule (0.4 mg total) by mouth daily. 90 capsule 3  . testosterone (ANDROGEL) 50 MG/5GM GEL Place onto the skin daily.      . Turmeric 500 MG CAPS Take by mouth.      . valsartan (DIOVAN) 40 MG tablet Take 1 tablet (40 mg total) by mouth daily. 90 tablet 3  . vitamin C (ASCORBIC ACID) 500 MG tablet Take 500 mg by mouth 2 (two) times daily.     No current facility-administered medications on file prior to visit.     Past Medical History:  Diagnosis Date  . Arthritis   . CAD (coronary artery disease)   . COPD (chronic obstructive pulmonary disease) ([place])   . Elevated glycated hemoglobin   . Emphysema of lung ([place])   . GERD (gastroesophageal reflux disease)   . Heart murmur   . Hyperlipidemia   . Hypertension   . Hypopituitarism ([place])    hypofunction, Dr.Dennish Kohut  . Osteopenia    Dr Wilson Singer  . Peyronie disease   . Testosterone deficiency    Dr Wilson Singer    Past Surgical History:  Procedure Laterality Date  . bone spur  2006   5th toe bilaterally  . COLONOSCOPY  2007   negative; Dr Sharlett Iles  . CORONARY ARTERY BYPASS GRAFT  2001   2 vessels  . fracture heel  1967   Bil/ due to fall off ladder  . Fractured calcaneus  1969   bilaterally w/ ankle fusion  . HERNIA REPAIR  1984   right inguinal hernia  . MENISECTOMY   2007   R medial, Dr. Lorin Mercy  . [place]   left  . UPPER GASTROINTESTINAL ENDOSCOPY  2007   GERD, Dr Sharlett Iles  . VASECTOMY      Social History   Social History  . Marital status: Married  Spouse name: N/A  . Number of children: N/A  . Years of education: N/A   Social History Main Topics  . Smoking status: Former Smoker    Quit date: 01/07/1998  . Smokeless tobacco: Never Used     Comment: smoked 1963- 2000, up to 1 ppd  . Alcohol use No  . Drug use: No  . Sexual activity: Not on file   Other Topics Concern  . Not on file   Social History Narrative  . No narrative on file    Family History  Problem Relation Age of Onset  . Heart attack Father 6  . Diabetes Mother   . Stroke Mother 26  . Hypertension Mother   . Leukemia Sister   . Colon cancer Paternal Uncle   . Aortic aneurysm Brother        abdominal  . Heart disease Brother   . Cancer Maternal Uncle         X3:prostate , renal, bone   . Parkinson's disease Sister   . Stroke Sister   . Stroke Sister   . Stroke Sister     Review of Systems  Constitutional: Negative for appetite change, chills, fatigue and fever.  HENT: Negative for hearing loss and tinnitus.   Eyes: Negative for visual disturbance.  Respiratory: Positive for cough (occ). Negative for shortness of breath and wheezing.   Cardiovascular: Positive for leg swelling. Negative for chest pain and palpitations.  Gastrointestinal: Negative for abdominal pain, blood in stool, constipation, diarrhea and nausea.       Gerd controlled  Genitourinary: Positive for difficulty urinating, dysuria (see urology) and frequency. Negative for hematuria.  Musculoskeletal: Positive for arthralgias (hip bursitis) and neck pain. Negative for back pain.       Arm and Leg tiredness when he first wakes up and with activity sometimes - will try to start walking  Skin: Negative for color change and rash.  Neurological: Negative for light-headedness and  headaches.  Psychiatric/Behavioral: Negative for dysphoric mood and sleep disturbance. The patient is not nervous/anxious.        Objective:   Vitals:   08/21/16 0800  BP: 134/72  Pulse: (!) 54  Resp: 16  Temp: 97.7 F (36.5 C)  SpO2: 95%   Filed Weights   08/21/16 0800  Weight: 191 lb (86.6 kg)   Body mass index is 25.9 kg/m.  Wt Readings from Last 3 Encounters:  08/21/16 191 lb (86.6 kg)  08/14/16 191 lb 3.2 oz (86.7 kg)  05/22/16 189 lb (85.7 kg)     Physical Exam Constitutional: He appears well-developed and well-nourished. No distress.  HENT:  Head: Normocephalic and atraumatic.  Right Ear: External ear normal.  Left Ear: External ear normal.  Mouth/Throat: Oropharynx is clear and moist.  Normal ear canals and TM b/l  Eyes: Conjunctivae and EOM are normal.  Neck: Neck supple. No tracheal deviation present. No thyromegaly present.  No carotid bruit  Cardiovascular: Normal rate, regular rhythm, normal heart sounds and intact distal pulses.   No murmur heard. Pulmonary/Chest: Effort normal and breath sounds normal. No respiratory distress. He has no wheezes. He has no rales.  Abdominal: Soft. He exhibits no distension. There is no tenderness.  Genitourinary: deferred  Musculoskeletal: He exhibits no edema.  Lymphadenopathy:   He has no cervical adenopathy.  Skin: Skin is warm and dry. He is not diaphoretic.  Psychiatric: He has a normal mood and affect. His behavior is normal.  Assessment & Plan:   Wellness Exam: Immunizations  Up to date, discussed shingrix Colonoscopy  Scheduled  Eye exam   Up to date  Hearing loss  none Memory concerns / difficulties -  Mild - likely normal for age, not worse and no concerning symptoms Independent of ADLs  fully Stressed the importance of regular exercise   Patient received copy of preventative screening tests/immunizations recommended for the next 5-10 years.   Physical exam: Screening blood work   ordered Immunizations  Up to date, discussed shingrix Colonoscopy  scheduled Eye exams   Up to date  EKG   Up to date  Exercise  - some exercise, will try to start walking Weight  Good for age Skin   No concerns Substance abuse  none  See Problem List for Assessment and Plan of chronic medical problems.    F/u annually

## 2016-08-21 ENCOUNTER — Encounter: Payer: Self-pay | Admitting: Internal Medicine

## 2016-08-21 ENCOUNTER — Ambulatory Visit (INDEPENDENT_AMBULATORY_CARE_PROVIDER_SITE_OTHER): Payer: Medicare Other | Admitting: Internal Medicine

## 2016-08-21 VITALS — BP 134/72 | HR 54 | Temp 97.7°F | Resp 16 | Ht 72.0 in | Wt 191.0 lb

## 2016-08-21 DIAGNOSIS — E785 Hyperlipidemia, unspecified: Secondary | ICD-10-CM | POA: Diagnosis not present

## 2016-08-21 DIAGNOSIS — Z0000 Encounter for general adult medical examination without abnormal findings: Secondary | ICD-10-CM

## 2016-08-21 DIAGNOSIS — I10 Essential (primary) hypertension: Secondary | ICD-10-CM | POA: Diagnosis not present

## 2016-08-21 DIAGNOSIS — K219 Gastro-esophageal reflux disease without esophagitis: Secondary | ICD-10-CM

## 2016-08-21 DIAGNOSIS — E349 Endocrine disorder, unspecified: Secondary | ICD-10-CM | POA: Diagnosis not present

## 2016-08-21 DIAGNOSIS — I2510 Atherosclerotic heart disease of native coronary artery without angina pectoris: Secondary | ICD-10-CM

## 2016-08-21 DIAGNOSIS — E7800 Pure hypercholesterolemia, unspecified: Secondary | ICD-10-CM

## 2016-08-21 MED ORDER — OMEPRAZOLE 20 MG PO CPDR: 20.0000 mg | DELAYED_RELEASE_CAPSULE | Freq: Every day | ORAL | 3 refills | Status: DC

## 2016-08-21 MED ORDER — ROSUVASTATIN CALCIUM 20 MG PO TABS: 10.0000 mg | ORAL_TABLET | Freq: Every day | ORAL | 3 refills | Status: DC

## 2016-08-21 MED ORDER — LOSARTAN POTASSIUM 25 MG PO TABS: 25.0000 mg | ORAL_TABLET | Freq: Every day | ORAL | 3 refills | Status: DC

## 2016-08-21 NOTE — Assessment & Plan Note (Signed)
BP well controlled Current regimen effective and well tolerated Continue current medications at current doses  -- change valsartan to losartan

## 2016-08-21 NOTE — Assessment & Plan Note (Signed)
Asymptomatic Continue ASA 81 mg and statin

## 2016-08-21 NOTE — Assessment & Plan Note (Signed)
Check lipid panel  Continue daily statin Regular exercise and healthy diet encouraged  

## 2016-08-21 NOTE — Assessment & Plan Note (Signed)
GERD controlled Continue daily medication  

## 2016-08-21 NOTE — Assessment & Plan Note (Signed)
Following with D Wilson Singer

## 2016-08-27 ENCOUNTER — Ambulatory Visit (AMBULATORY_SURGERY_CENTER): Payer: Medicare Other | Admitting: Gastroenterology

## 2016-08-27 ENCOUNTER — Encounter: Payer: Self-pay | Admitting: Gastroenterology

## 2016-08-27 VITALS — BP 124/70 | HR 50 | Temp 97.8°F | Resp 20 | Ht 72.0 in | Wt 191.0 lb

## 2016-08-27 DIAGNOSIS — I10 Essential (primary) hypertension: Secondary | ICD-10-CM | POA: Diagnosis not present

## 2016-08-27 DIAGNOSIS — D123 Benign neoplasm of transverse colon: Secondary | ICD-10-CM

## 2016-08-27 DIAGNOSIS — Z1211 Encounter for screening for malignant neoplasm of colon: Secondary | ICD-10-CM

## 2016-08-27 DIAGNOSIS — Z1212 Encounter for screening for malignant neoplasm of rectum: Secondary | ICD-10-CM | POA: Diagnosis not present

## 2016-08-27 DIAGNOSIS — D124 Benign neoplasm of descending colon: Secondary | ICD-10-CM

## 2016-08-27 MED ORDER — SODIUM CHLORIDE 0.9 % IV SOLN: 500.0000 mL | INTRAVENOUS | Status: DC

## 2016-08-27 NOTE — Op Note (Signed)
[place] Patient Name: Patrick Harris Procedure Date: 08/27/2016 7:47 AM MRN: [id number] Endoscopist: Ladene Artist , MD Age: 71 Referring MD:  Date of Birth: 07/03/1945 Gender: Male Account #: [id number] Procedure:                Colonoscopy Indications:              Screening for colorectal malignant neoplasm Medicines:                Monitored Anesthesia Care Procedure:                Pre-Anesthesia Assessment:                           - Prior to the procedure, a History and Physical                            was performed, and patient medications and                            allergies were reviewed. The patient's tolerance of                            previous anesthesia was also reviewed. The risks                            and benefits of the procedure and the sedation                            options and risks were discussed with the patient.                            All questions were answered, and informed consent                            was obtained. Prior Anticoagulants: The patient has                            taken no previous anticoagulant or antiplatelet                            agents. ASA Grade Assessment: II - A patient with                            mild systemic disease. After reviewing the risks                            and benefits, the patient was deemed in                            satisfactory condition to undergo the procedure.                           After obtaining informed consent, the colonoscope  was passed under direct vision. Throughout the                            procedure, the patient's blood pressure, pulse, and                            oxygen saturations were monitored continuously. The                            Colonoscope was introduced through the anus and                            advanced to the the cecum, identified by                            appendiceal orifice and  ileocecal valve. The                            ileocecal valve, appendiceal orifice, and rectum                            were photographed. The quality of the bowel                            preparation was good. The colonoscopy was performed                            without difficulty. The patient tolerated the                            procedure well. Scope In: 8:04:42 AM Scope Out: 8:21:28 AM Scope Withdrawal Time: 0 hours 13 minutes 3 seconds  Total Procedure Duration: 0 hours 16 minutes 46 seconds  Findings:                 The perianal and digital rectal examinations were                            normal.                           Three sessile polyps were found in the descending                            colon and transverse colon. The polyps were 5 to 8                            mm in size. These polyps were removed with a cold                            snare. Resection and retrieval were complete.                           A few small-mouthed diverticula were found in the  sigmoid colon.                           Internal hemorrhoids were found during                            retroflexion. The hemorrhoids were small and Grade                            I (internal hemorrhoids that do not prolapse).                           The exam was otherwise without abnormality on                            direct and retroflexion views. Complications:            No immediate complications. Estimated blood loss:                            None. Estimated Blood Loss:     Estimated blood loss: none. Impression:               - Three 5 to 8 mm polyps in the descending colon                            and in the transverse colon, removed with a cold                            snare. Resected and retrieved.                           - Diverticulosis in the sigmoid colon.                           - Internal hemorrhoids.                           - The  examination was otherwise normal on direct                            and retroflexion views. Recommendation:           - Repeat colonoscopy in 3 - 5 years for                            surveillance pending path review.                           - Patient has a contact number available for                            emergencies. The signs and symptoms of potential                            delayed complications were discussed with the  patient. Return to normal activities tomorrow.                            Written discharge instructions were provided to the                            patient.                           - Resume previous diet.                           - Continue present medications.                           - Await pathology results. Ladene Artist, MD 08/27/2016 8:24:35 AM This report has been signed electronically.

## 2016-08-27 NOTE — Progress Notes (Signed)
No problems noted in the recovery room. maw 

## 2016-08-27 NOTE — Progress Notes (Signed)
Called to room to assist during endoscopic procedure.  Patient ID and intended procedure confirmed with present staff. Received instructions for my participation in the procedure from the performing physician.  

## 2016-08-27 NOTE — Patient Instructions (Signed)
YOU HAD AN ENDOSCOPIC PROCEDURE TODAY AT [facility]:   Refer to the procedure report that was given to you for any specific questions about what was found during the examination.  If the procedure report does not answer your questions, please call your gastroenterologist to clarify.  If you requested that your care partner not be given the details of your procedure findings, then the procedure report has been included in a sealed envelope for you to review at your convenience later.  YOU SHOULD EXPECT: Some feelings of bloating in the abdomen. Passage of more gas than usual.  Walking can help get rid of the air that was put into your GI tract during the procedure and reduce the bloating. If you had a lower endoscopy (such as a colonoscopy or flexible sigmoidoscopy) you may notice spotting of blood in your stool or on the toilet paper. If you underwent a bowel prep for your procedure, you may not have a normal bowel movement for a few days.  Please Note:  You might notice some irritation and congestion in your nose or some drainage.  This is from the oxygen used during your procedure.  There is no need for concern and it should clear up in a day or so.  SYMPTOMS TO REPORT IMMEDIATELY:   Following lower endoscopy (colonoscopy or flexible sigmoidoscopy):  Excessive amounts of blood in the stool  Significant tenderness or worsening of abdominal pains  Swelling of the abdomen that is new, acute  Fever of 100F or higher   For urgent or emergent issues, a gastroenterologist can be reached at any hour by calling [phone].   DIET:  We do recommend a small meal at first, but then you may proceed to your regular diet.  Drink plenty of fluids but you should avoid alcoholic beverages for 24 hours.  ACTIVITY:  You should plan to take it easy for the rest of today and you should NOT DRIVE or use heavy machinery until tomorrow (because of the sedation medicines used during the test).     FOLLOW UP: Our staff will call the number listed on your records the next business day following your procedure to check on you and address any questions or concerns that you may have regarding the information given to you following your procedure. If we do not reach you, we will leave a message.  However, if you are feeling well and you are not experiencing any problems, there is no need to return our call.  We will assume that you have returned to your regular daily activities without incident.  If any biopsies were taken you will be contacted by phone or by letter within the next 1-3 weeks.  Please call us at [phone] if you have not heard about the biopsies in 3 weeks.    SIGNATURES/CONFIDENTIALITY: You and/or your care partner have signed paperwork which will be entered into your electronic medical record.  These signatures attest to the fact that that the information above on your After Visit Summary has been reviewed and is understood.  Full responsibility of the confidentiality of this discharge information lies with you and/or your care-partner.    Handouts were given to your care partner on polyps, diverticulosis, hemorrhoids, and a high fiber diet with liberal fluid intake. You may resume your current medications today. Await biopsy results. Please call if any questions or concerns.

## 2016-08-27 NOTE — Progress Notes (Signed)
Report to PACU, RN, vss, BBS= Clear.  

## 2016-08-28 ENCOUNTER — Telehealth: Payer: Self-pay

## 2016-08-28 NOTE — Telephone Encounter (Signed)
Enter in error

## 2016-08-28 NOTE — Telephone Encounter (Signed)
Left message on answering machine. 

## 2016-08-28 NOTE — Telephone Encounter (Signed)
  Follow up Call-  Call back number 08/27/2016  Post procedure Call Back phone  # [phone]  Permission to leave phone message Yes  Some recent data might be hidden     Patient questions:  Do you have a fever, pain , or abdominal swelling? No. Pain Score  0 *  Have you tolerated food without any problems? Yes.    Have you been able to return to your normal activities? Yes.    Do you have any questions about your discharge instructions: Diet   No. Medications  No. Follow up visit  No.  Do you have questions or concerns about your Care? No.  Actions: * If pain score is 4 or above: No action needed, pain <4.

## 2016-08-30 ENCOUNTER — Other Ambulatory Visit: Payer: Self-pay | Admitting: Internal Medicine

## 2016-09-03 ENCOUNTER — Telehealth: Payer: Self-pay | Admitting: Gastroenterology

## 2016-09-03 NOTE — Telephone Encounter (Signed)
Patient notified that the letter should come to him in the next few days with recall information.

## 2016-09-05 ENCOUNTER — Encounter: Payer: Self-pay | Admitting: Gastroenterology

## 2016-09-05 DIAGNOSIS — E291 Testicular hypofunction: Secondary | ICD-10-CM | POA: Diagnosis not present

## 2016-09-11 NOTE — Progress Notes (Signed)
Subjective:    Patient ID: Patrick Harris, male    DOB: 1945/11/09, 71 y.o.   MRN: [id number]  HPI The patient is here for follow up.  Testosterone deficiency:  He is following with endocrine - Dr Wilson Singer, but he just recently retired.   He was on topical angrogel via the pump and then the gel, but more recently has been using compounded testosterone cream.  It has been effective for one year, but over the past 6 months it has not been effective and his testosterone level has dropped and he if very fatigued.    Testosterone level  165 on 09/05/16,   303  On 06/05/16 Free testosterone    3.7                  ,    5.7  Between may and august the testosterone level in the compounded cream was increased, but his level still decreased.  He wonders if he needs to go back on the androgel pump.  He was also following with endocrine for pituitary insufficiency.  He will establish with the new endocrinologist that will be replacing Dr Wilson Singer.  His prolactin level also dropped.    Medications and allergies reviewed with patient and updated if appropriate.  Patient Active Problem List   Diagnosis Date Noted  . Varicose veins of both lower extremities 05/22/2016  . Vitamin D deficiency 08/21/2015  . Essential hypertension 01/24/2015  . Family history of prostate cancer 08/19/2014  . Osteopenia 08/07/2009  . Hyperlipidemia 08/12/2007  . PITUITARY INSUFFICIENCY 08/05/2007  . Testosterone deficiency 08/05/2007  . Coronary atherosclerosis 08/05/2007  . GERD 08/05/2007  . BPH without obstruction/lower urinary tract symptoms 08/05/2007    Current Outpatient Prescriptions on File Prior to Visit  Medication Sig Dispense Refill  . aspirin EC 81 MG tablet Take 1 tablet (81 mg total) by mouth daily.    . ASTRAGALUS PO Take 500 mg by mouth daily.      . bromocriptine (PARLODEL) 2.5 MG tablet Take 7.5 mg by mouth daily.     . Calcium-Magnesium-Zinc (CAL-MAG-ZINC PO) Take by mouth daily.      .  Cholecalciferol (VITAMIN D3) 2000 UNITS TABS Take by mouth. 2000 iu in the am, 1000 iu in the pm    . Coenzyme Q10 (COQ10) 100 MG CAPS Take by mouth daily.      Marland Kitchen CRANBERRY PO Take by mouth. 1 tablespoon daily     . diclofenac sodium (VOLTAREN) 1 % GEL Apply topically daily.     . Flaxseed, Linseed, (FLAX SEED OIL PO) Take by mouth.      . Garlic 1601 MG CAPS Take 1,000 mg by mouth 2 (two) times daily.    . Iodine, Kelp, (KELP PO) Take by mouth daily.    Marland Kitchen L-Arginine 500 MG CAPS Take by mouth daily.      Marland Kitchen losartan (COZAAR) 25 MG tablet Take 1 tablet (25 mg total) by mouth daily. 90 tablet 3  . mirabegron ER (MYRBETRIQ) 50 MG TB24 tablet Take 50 mg by mouth daily.    Marland Kitchen omeprazole (PRILOSEC) 20 MG capsule Take 1 capsule (20 mg total) by mouth daily. 90 capsule 3  . Pomegranate Seed 80 % OIL by Does not apply route. 1 table spoon a day     . Potassium 99 MG TABS Take by mouth daily.      . Probiotic Product (PROBIOTIC DAILY PO) Take by mouth daily.    Marland Kitchen  rosuvastatin (CRESTOR) 20 MG tablet Take 0.5 tablets (10 mg total) by mouth daily. 45 tablet 3  . tamsulosin (FLOMAX) 0.4 MG CAPS capsule Take 1 capsule (0.4 mg total) by mouth daily. 90 capsule 3  . Turmeric 500 MG CAPS Take by mouth.      . vitamin C (ASCORBIC ACID) 500 MG tablet Take 500 mg by mouth 2 (two) times daily.     Current Facility-Administered Medications on File Prior to Visit  Medication Dose Route Frequency Provider Last Rate Last Dose  . 0.9 %  sodium chloride infusion  500 mL Intravenous Continuous Ladene Artist, MD        Past Medical History:  Diagnosis Date  . Arthritis   . CAD (coronary artery disease)   . COPD (chronic obstructive pulmonary disease) ([place])   . Elevated glycated hemoglobin   . Emphysema of lung ([place])   . GERD (gastroesophageal reflux disease)   . Heart murmur   . Hyperlipidemia   . Hypertension   . Hypopituitarism ([place])    hypofunction, Dr.Dennish Kohut  . Osteopenia    Dr Wilson Singer  . Peyronie  disease   . Testosterone deficiency    Dr Wilson Singer    Past Surgical History:  Procedure Laterality Date  . bone spur  2006   5th toe bilaterally  . COLONOSCOPY  2007   negative; Dr Sharlett Iles  . CORONARY ARTERY BYPASS GRAFT  2001   2 vessels  . fracture heel  1967   Bil/ due to fall off ladder  . Fractured calcaneus  1969   bilaterally w/ ankle fusion  . HERNIA REPAIR  1984   right inguinal hernia  . MENISECTOMY  2007   R medial, Dr. Lorin Mercy  . [place]   left  . UPPER GASTROINTESTINAL ENDOSCOPY  2007   GERD, Dr Sharlett Iles  . VASECTOMY      Social History   Social History  . Marital status: Married    Spouse name: N/A  . Number of children: N/A  . Years of education: N/A   Social History Main Topics  . Smoking status: Former Smoker    Quit date: 01/07/1998  . Smokeless tobacco: Never Used     Comment: smoked 1963- 2000, up to 1 ppd  . Alcohol use No  . Drug use: No  . Sexual activity: Not Asked   Other Topics Concern  . None   Social History Narrative  . None    Family History  Problem Relation Age of Onset  . Heart attack Father 17  . Diabetes Mother   . Stroke Mother 52  . Hypertension Mother   . Leukemia Sister   . Colon cancer Paternal Uncle   . Aortic aneurysm Brother        abdominal  . Heart disease Brother   . Atrial fibrillation Brother   . Cancer Maternal Uncle         X3:prostate , renal, bone   . Parkinson's disease Sister   . Stroke Sister   . Stroke Sister   . Stroke Sister   . Atrial fibrillation Sister     Review of Systems  Constitutional: Positive for fatigue. Negative for chills and fever.  Psychiatric/Behavioral:       Decreased motivation       Objective:   Vitals:   09/12/16 1036  BP: (!) 144/78  Pulse: 60  Resp: 16  Temp: 97.8 F (36.6 C)  SpO2: 98%   Wt Readings  from Last 3 Encounters:  09/12/16 194 lb (88 kg)  08/27/16 191 lb (86.6 kg)  08/21/16 191 lb (86.6 kg)   Body mass index is 26.31  kg/m.   Physical Exam  Constitutional: He appears well-developed and well-nourished. No distress.  HENT:  Head: Normocephalic and atraumatic.  Skin: Skin is warm and dry. He is not diaphoretic.  Psychiatric: He has a normal mood and affect. His behavior is normal. Judgment and thought content normal.           Assessment & Plan:    See Problem List for Assessment and Plan of chronic medical problems.

## 2016-09-12 ENCOUNTER — Encounter: Payer: Self-pay | Admitting: Internal Medicine

## 2016-09-12 ENCOUNTER — Ambulatory Visit (INDEPENDENT_AMBULATORY_CARE_PROVIDER_SITE_OTHER): Payer: Medicare Other | Admitting: Internal Medicine

## 2016-09-12 VITALS — BP 144/78 | HR 60 | Temp 97.8°F | Resp 16 | Wt 194.0 lb

## 2016-09-12 DIAGNOSIS — E349 Endocrine disorder, unspecified: Secondary | ICD-10-CM

## 2016-09-12 MED ORDER — TESTOSTERONE 20.25 MG/ACT (1.62%) TD GEL: 2.0000 | Freq: Every day | TRANSDERMAL | 5 refills | Status: DC

## 2016-09-12 NOTE — Patient Instructions (Signed)
We will try the testosterone pump.  We will send this to your pharmacy and get approval if needed.

## 2016-09-12 NOTE — Assessment & Plan Note (Signed)
Dr Wilson Singer just retired and he will soon be establishing with a new endocrinologist, but his testosterone is low and not responding to the increased testosterone in the compounded testosterone cream Will restart testosterone via the pump, which he did well with in the past - 2 pumps daily - may need prior auth

## 2016-10-19 ENCOUNTER — Other Ambulatory Visit: Payer: Self-pay | Admitting: Internal Medicine

## 2016-10-28 ENCOUNTER — Ambulatory Visit (INDEPENDENT_AMBULATORY_CARE_PROVIDER_SITE_OTHER): Payer: Medicare Other | Admitting: Internal Medicine

## 2016-10-28 ENCOUNTER — Encounter: Payer: Self-pay | Admitting: Internal Medicine

## 2016-10-28 VITALS — BP 176/90 | HR 74 | Temp 97.7°F | Resp 18 | Wt 190.0 lb

## 2016-10-28 DIAGNOSIS — I10 Essential (primary) hypertension: Secondary | ICD-10-CM

## 2016-10-28 MED ORDER — NEBIVOLOL HCL 10 MG PO TABS: 10.0000 mg | ORAL_TABLET | Freq: Every day | ORAL | 5 refills | Status: DC

## 2016-10-28 NOTE — Assessment & Plan Note (Signed)
Not controlled D/c losartan Will try bystolic if covered by insurance - 10 mg daily He will monitor his BP and update me in a couple of days.  ? Related to losartan not being effective, pituitary insuffiencey seems less likely given normal labs two months ago If BP not improved may need further evaluation Tingling on left side of head occurred in past with elevated BP - MRI 01/2015 normal, so I will not image at this time

## 2016-10-28 NOTE — Progress Notes (Signed)
Subjective:    Patient ID: Patrick Harris, male    DOB: September 21, 1945, 71 y.o.   MRN: [id number]  HPI He is here for an acute visit.    Over the past two weeks his BP has been elevated.  He has been checking it and it has been 134/66 - 184/86.  He was on valsartan, but this was changed to losartan with the recall.  The past couple of days he did take 50 mg of losartan.    He has had some headaches, but only a couple of days and they have been mild.  She has numbness on the left side of his face.  This occurred one other time in January of 2017 and he went to the ED at [facility] and an MRI of his head was normal.  He denies any numbness or tingling elsewhere or any weakness in his arms / legs.    He sees his endocrinologist in December for follow up regarding his pituitary insufficiency.  He is taking his testosterone as prescribed.     Medications and allergies reviewed with patient and updated if appropriate.  Patient Active Problem List   Diagnosis Date Noted  . Varicose veins of both lower extremities 05/22/2016  . Vitamin D deficiency 08/21/2015  . Essential hypertension 01/24/2015  . Family history of prostate cancer 08/19/2014  . Osteopenia 08/07/2009  . Hyperlipidemia 08/12/2007  . PITUITARY INSUFFICIENCY 08/05/2007  . Testosterone deficiency 08/05/2007  . Coronary atherosclerosis 08/05/2007  . GERD 08/05/2007  . BPH without obstruction/lower urinary tract symptoms 08/05/2007    Current Outpatient Prescriptions on File Prior to Visit  Medication Sig Dispense Refill  . aspirin EC 81 MG tablet Take 1 tablet (81 mg total) by mouth daily.    . ASTRAGALUS PO Take 500 mg by mouth daily.      . bromocriptine (PARLODEL) 2.5 MG tablet Take 7.5 mg by mouth daily.     . Calcium-Magnesium-Zinc (CAL-MAG-ZINC PO) Take by mouth daily.      . Cholecalciferol (VITAMIN D3) 2000 UNITS TABS Take by mouth. 2000 iu in the am, 1000 iu in the pm    . Coenzyme Q10 (COQ10) 100 MG CAPS Take by  mouth daily.      Marland Kitchen CRANBERRY PO Take by mouth. 1 tablespoon daily     . diclofenac sodium (VOLTAREN) 1 % GEL Apply topically daily.     . Flaxseed, Linseed, (FLAX SEED OIL PO) Take by mouth.      . Garlic 4481 MG CAPS Take 1,000 mg by mouth 2 (two) times daily.    . Iodine, Kelp, (KELP PO) Take by mouth daily.    Marland Kitchen L-Arginine 500 MG CAPS Take by mouth daily.      Marland Kitchen losartan (COZAAR) 25 MG tablet Take 1 tablet (25 mg total) by mouth daily. 90 tablet 3  . mirabegron ER (MYRBETRIQ) 50 MG TB24 tablet Take 50 mg by mouth daily.    Marland Kitchen omeprazole (PRILOSEC) 20 MG capsule Take 1 capsule (20 mg total) by mouth daily. 90 capsule 3  . Pomegranate Seed 80 % OIL by Does not apply route. 1 table spoon a day     . Potassium 99 MG TABS Take by mouth daily.      . Probiotic Product (PROBIOTIC DAILY PO) Take by mouth daily.    . rosuvastatin (CRESTOR) 20 MG tablet Take 0.5 tablets (10 mg total) by mouth daily. 45 tablet 3  . tamsulosin (FLOMAX) 0.4 MG CAPS  capsule TAKE 1 CAPSULE (0.4 MG TOTAL) BY MOUTH DAILY. 90 capsule 3  . Testosterone 20.25 MG/ACT (1.62%) GEL Place 2 Pump onto the skin daily. 75 g 5  . Turmeric 500 MG CAPS Take by mouth.      . vitamin C (ASCORBIC ACID) 500 MG tablet Take 500 mg by mouth 2 (two) times daily.     No current facility-administered medications on file prior to visit.     Past Medical History:  Diagnosis Date  . Arthritis   . CAD (coronary artery disease)   . COPD (chronic obstructive pulmonary disease) ([place])   . Elevated glycated hemoglobin   . Emphysema of lung ([place])   . GERD (gastroesophageal reflux disease)   . Heart murmur   . Hyperlipidemia   . Hypertension   . Hypopituitarism ([place])    hypofunction, Dr.Dennish Kohut  . Osteopenia    Dr Wilson Singer  . Peyronie disease   . Testosterone deficiency    Dr Wilson Singer    Past Surgical History:  Procedure Laterality Date  . bone spur  2006   5th toe bilaterally  . COLONOSCOPY  2007   negative; Dr Sharlett Iles  . CORONARY  ARTERY BYPASS GRAFT  2001   2 vessels  . fracture heel  1967   Bil/ due to fall off ladder  . Fractured calcaneus  1969   bilaterally w/ ankle fusion  . HERNIA REPAIR  1984   right inguinal hernia  . MENISECTOMY  2007   R medial, Dr. Lorin Mercy  . [place]   left  . UPPER GASTROINTESTINAL ENDOSCOPY  2007   GERD, Dr Sharlett Iles  . VASECTOMY      Social History   Social History  . Marital status: Married    Spouse name: N/A  . Number of children: N/A  . Years of education: N/A   Social History Main Topics  . Smoking status: Former Smoker    Quit date: 01/07/1998  . Smokeless tobacco: Never Used     Comment: smoked 1963- 2000, up to 1 ppd  . Alcohol use No  . Drug use: No  . Sexual activity: Not Asked   Other Topics Concern  . None   Social History Narrative  . None    Family History  Problem Relation Age of Onset  . Heart attack Father 65  . Diabetes Mother   . Stroke Mother 18  . Hypertension Mother   . Leukemia Sister   . Colon cancer Paternal Uncle   . Aortic aneurysm Brother        abdominal  . Heart disease Brother   . Atrial fibrillation Brother   . Cancer Maternal Uncle         X3:prostate , renal, bone   . Parkinson's disease Sister   . Stroke Sister   . Stroke Sister   . Stroke Sister   . Atrial fibrillation Sister     Review of Systems  Constitutional: Negative for chills and fever.  Respiratory: Negative for shortness of breath.   Cardiovascular: Positive for leg swelling (at night, mild). Negative for chest pain and palpitations.  Neurological: Positive for light-headedness, numbness (tingling in left side of face) and headaches (mild, couple of days only). Negative for weakness.       Objective:   Vitals:   10/28/16 1120  BP: (!) 176/90  Pulse: 74  Resp: 18  Temp: 97.7 F (36.5 C)  SpO2: 95%   Filed Weights   10/28/16 1120  Weight: 190 lb (86.2 kg)   Body mass index is 25.77 kg/m.  Wt Readings from Last 3  Encounters:  10/28/16 190 lb (86.2 kg)  09/12/16 194 lb (88 kg)  08/27/16 191 lb (86.6 kg)     Physical Exam Constitutional: Appears well-developed and well-nourished. No distress.  HENT:  Head: Normocephalic and atraumatic.  Neck: Neck supple. No tracheal deviation present. No thyromegaly present.  No cervical lymphadenopathy Cardiovascular: Normal rate, regular rhythm and normal heart sounds.   No murmur heard. No carotid bruit .  No edema Pulmonary/Chest: Effort normal and breath sounds normal. No respiratory distress. No has no wheezes. No rales.  Skin: Skin is warm and dry. Not diaphoretic.  Psychiatric: Normal mood and affect. Behavior is normal.         Assessment & Plan:   See Problem List for Assessment and Plan of chronic medical problems.

## 2016-10-28 NOTE — Patient Instructions (Addendum)
  Medications reviewed and updated.  Changes include stopping losartan and starting bystolic.     Your prescription(s) have been submitted to your pharmacy. Please take as directed and contact our office if you believe you are having problem(s) with the medication(s).  Monitor your BP and call with an update.

## 2016-11-04 ENCOUNTER — Telehealth: Payer: Self-pay | Admitting: Internal Medicine

## 2016-11-04 MED ORDER — LISINOPRIL-HYDROCHLOROTHIAZIDE 10-12.5 MG PO TABS: 1.0000 | ORAL_TABLET | Freq: Every day | ORAL | 1 refills | Status: DC

## 2016-11-04 NOTE — Telephone Encounter (Signed)
Spoke with pt to inform, New RX sent to [facility]

## 2016-11-04 NOTE — Telephone Encounter (Signed)
Patient states he was to call back to let Dr. Quay Burow know if BP meds was working correctly.  States bystolic 10mg  is not working.  States yesterday morning BP was 155/71 w/ HR of 42 and 10:30pm 181/73 w HR 43.  States checked this morning was 150/69 w HR 42.  Is requesting call back in regard.

## 2016-11-04 NOTE — Telephone Encounter (Signed)
Lets try lisinopril-hcyz - start 10-12.5 mg daily  - we may need to increase.    Stop bystolic   New med pending

## 2016-11-07 DIAGNOSIS — R3915 Urgency of urination: Secondary | ICD-10-CM | POA: Diagnosis not present

## 2016-11-15 ENCOUNTER — Other Ambulatory Visit: Payer: Self-pay

## 2016-11-15 ENCOUNTER — Emergency Department (HOSPITAL_COMMUNITY)
Admission: EM | Admit: 2016-11-15 | Discharge: 2016-11-15 | Disposition: A | Discharge status: Home / Self Care | Payer: Medicare Other | Attending: Emergency Medicine | Admitting: Emergency Medicine

## 2016-11-15 ENCOUNTER — Encounter (HOSPITAL_COMMUNITY): Payer: Self-pay

## 2016-11-15 ENCOUNTER — Emergency Department (HOSPITAL_COMMUNITY): Payer: Medicare Other

## 2016-11-15 DIAGNOSIS — I2510 Atherosclerotic heart disease of native coronary artery without angina pectoris: Secondary | ICD-10-CM | POA: Diagnosis not present

## 2016-11-15 DIAGNOSIS — Z7982 Long term (current) use of aspirin: Secondary | ICD-10-CM | POA: Insufficient documentation

## 2016-11-15 DIAGNOSIS — J449 Chronic obstructive pulmonary disease, unspecified: Secondary | ICD-10-CM | POA: Insufficient documentation

## 2016-11-15 DIAGNOSIS — Z87891 Personal history of nicotine dependence: Secondary | ICD-10-CM | POA: Diagnosis not present

## 2016-11-15 DIAGNOSIS — R5383 Other fatigue: Secondary | ICD-10-CM | POA: Diagnosis not present

## 2016-11-15 DIAGNOSIS — Z79899 Other long term (current) drug therapy: Secondary | ICD-10-CM | POA: Diagnosis not present

## 2016-11-15 DIAGNOSIS — K8500 Idiopathic acute pancreatitis without necrosis or infection: Secondary | ICD-10-CM

## 2016-11-15 DIAGNOSIS — Z91040 Latex allergy status: Secondary | ICD-10-CM | POA: Diagnosis not present

## 2016-11-15 DIAGNOSIS — I10 Essential (primary) hypertension: Secondary | ICD-10-CM | POA: Diagnosis not present

## 2016-11-15 DIAGNOSIS — R109 Unspecified abdominal pain: Secondary | ICD-10-CM | POA: Diagnosis not present

## 2016-11-15 LAB — COMPREHENSIVE METABOLIC PANEL
ALK PHOS: 63 U/L (ref 38–126)
ALT: 20 U/L (ref 17–63)
ANION GAP: 8 (ref 5–15)
AST: 30 U/L (ref 15–41)
Albumin: 4 g/dL (ref 3.5–5.0)
BUN: 11 mg/dL (ref 6–20)
CALCIUM: 9.3 mg/dL (ref 8.9–10.3)
CO2: 29 mmol/L (ref 22–32)
Chloride: 100 mmol/L — ABNORMAL LOW (ref 101–111)
Creatinine, Ser: 1.07 mg/dL (ref 0.61–1.24)
GFR calc non Af Amer: >60 mL/min (ref >60)
GLUCOSE: 92 mg/dL (ref 65–99)
POTASSIUM: 3.8 mmol/L (ref 3.5–5.1)
SODIUM: 137 mmol/L (ref 135–145)
Total Bilirubin: 0.7 mg/dL (ref 0.3–1.2)
Total Protein: 7.1 g/dL (ref 6.5–8.1)

## 2016-11-15 LAB — CBC
HEMATOCRIT: 51.4 % (ref 39.0–52.0)
HEMOGLOBIN: 17.1 g/dL — AB (ref 13.0–17.0)
MCH: 29.2 pg (ref 26.0–34.0)
MCHC: 33.3 g/dL (ref 30.0–36.0)
MCV: 87.7 fL (ref 78.0–100.0)
Platelets: 167 10*3/uL (ref 150–400)
RBC: 5.86 MIL/uL — AB (ref 4.22–5.81)
RDW: 15.2 % (ref 11.5–15.5)
WBC: 9 10*3/uL (ref 4.0–10.5)

## 2016-11-15 LAB — URINALYSIS, ROUTINE W REFLEX MICROSCOPIC
Bilirubin Urine: NEGATIVE
Glucose, UA: NEGATIVE mg/dL
HGB URINE DIPSTICK: NEGATIVE
Ketones, ur: NEGATIVE mg/dL
Leukocytes, UA: NEGATIVE
Nitrite: NEGATIVE
PH: 7 (ref 5.0–8.0)
Protein, ur: NEGATIVE mg/dL
SPECIFIC GRAVITY, URINE: 1.008 (ref 1.005–1.030)

## 2016-11-15 LAB — LIPASE, BLOOD: LIPASE: 81 U/L — AB (ref 11–51)

## 2016-11-15 MED ORDER — IOPAMIDOL (ISOVUE-300) INJECTION 61%
INTRAVENOUS | Status: AC
  Filled 2016-11-15: qty 100

## 2016-11-15 MED ORDER — OXYCODONE-ACETAMINOPHEN 5-325 MG PO TABS: 1.0000 | ORAL_TABLET | ORAL | Status: DC | PRN

## 2016-11-15 MED ORDER — SODIUM CHLORIDE 0.9 % IV SOLN
INTRAVENOUS | Status: DC
  Administered 2016-11-15: 19:00:00 via INTRAVENOUS

## 2016-11-15 MED ORDER — IOPAMIDOL (ISOVUE-300) INJECTION 61%
INTRAVENOUS | Status: AC
  Administered 2016-11-15: 100 mL
  Filled 2016-11-15: qty 100

## 2016-11-15 MED ORDER — IBUPROFEN 400 MG PO TABS
600.0000 mg | ORAL_TABLET | Freq: Once | ORAL | Status: AC
  Administered 2016-11-15: 600 mg via ORAL
  Filled 2016-11-15: qty 1

## 2016-11-15 MED ORDER — TRAMADOL HCL 50 MG PO TABS: 50.0000 mg | ORAL_TABLET | Freq: Four times a day (QID) | ORAL | 0 refills | Status: DC | PRN

## 2016-11-15 NOTE — ED Provider Notes (Signed)
[facility] EMERGENCY DEPARTMENT Provider Note   CSN: [id number] Arrival date & time: 11/15/16  1358     History   Chief Complaint Chief Complaint  Patient presents with  . Abdominal Pain    HPI Patrick Harris is a 71 y.o. male.  HPI  Patient presents with abdominal pain. Patient's symptoms began 3 days ago, since onset of been persistent. Pain describes pain throughout the mid central abdomen, described as soreness, tightness. There is associated fatigue, but no nausea, vomiting, diarrhea, urinary changes. No relief with OTC medication including Pepto-Bismol. Patient has a history of hernia repair in the distant past. He has ongoing issues with blood pressure control, but states that in general he is healthy, functional.   Past Medical History:  Diagnosis Date  . Arthritis   . CAD (coronary artery disease)   . COPD (chronic obstructive pulmonary disease) ([place])   . Elevated glycated hemoglobin   . Emphysema of lung ([place])   . GERD (gastroesophageal reflux disease)   . Heart murmur   . Hyperlipidemia   . Hypertension   . Hypopituitarism ([place])    hypofunction, Dr.Dennish Kohut  . Osteopenia    Dr Wilson Singer  . Peyronie disease   . Testosterone deficiency    Dr Wilson Singer    Patient Active Problem List   Diagnosis Date Noted  . Varicose veins of both lower extremities 05/22/2016  . Vitamin D deficiency 08/21/2015  . Hypertension, uncontrolled 01/24/2015  . Family history of prostate cancer 08/19/2014  . Osteopenia 08/07/2009  . Hyperlipidemia 08/12/2007  . PITUITARY INSUFFICIENCY 08/05/2007  . Testosterone deficiency 08/05/2007  . Coronary atherosclerosis 08/05/2007  . GERD 08/05/2007  . BPH without obstruction/lower urinary tract symptoms 08/05/2007    Past Surgical History:  Procedure Laterality Date  . bone spur  2006   5th toe bilaterally  . COLONOSCOPY  2007   negative; Dr Sharlett Iles  . CORONARY ARTERY BYPASS GRAFT  2001   2  vessels  . fracture heel  1967   Bil/ due to fall off ladder  . Fractured calcaneus  1969   bilaterally w/ ankle fusion  . HERNIA REPAIR  1984   right inguinal hernia  . MENISECTOMY  2007   R medial, Dr. Lorin Mercy  . [place]   left  . UPPER GASTROINTESTINAL ENDOSCOPY  2007   GERD, Dr Sharlett Iles  . VASECTOMY         Home Medications    Prior to Admission medications   Medication Sig Start Date End Date Taking? Authorizing Provider  aspirin EC 81 MG tablet Take 1 tablet (81 mg total) by mouth daily. Patient taking differently: Take 81 mg at bedtime by mouth.  01/24/15  Yes Burns, Claudina Lick, MD  bromocriptine (PARLODEL) 2.5 MG tablet Take 7.5 mg by mouth daily.    Yes [provider]  Calcium-Magnesium-Zinc (CAL-MAG-ZINC PO) Take by mouth daily.     Yes [provider]  Cholecalciferol (VITAMIN D3) 2000 UNITS TABS Take by mouth. 2000 iu in the am, 1000 iu in the pm   Yes [provider]  Coenzyme Q10 (COQ10) 100 MG CAPS Take by mouth daily.     Yes [provider]  CRANBERRY PO Take by mouth. 1 tablespoon daily    Yes [provider]  diclofenac sodium (VOLTAREN) 1 % GEL Apply topically daily.    Yes [provider]  Flaxseed, Linseed, (FLAX SEED OIL PO) Take by mouth.  Yes [provider]  Garlic 6144 MG CAPS Take 1,000 mg by mouth 2 (two) times daily.   Yes [provider]  Iodine, Kelp, (KELP PO) Take by mouth daily.   Yes [provider]  L-Arginine 500 MG CAPS Take by mouth daily.     Yes [provider]  lisinopril-hydrochlorothiazide (PRINZIDE,ZESTORETIC) 10-12.5 MG tablet Take 1 tablet by mouth daily. 11/04/16  Yes Burns, Claudina Lick, MD  omeprazole (PRILOSEC) 20 MG capsule Take 1 capsule (20 mg total) by mouth daily. 08/21/16  Yes Burns, Claudina Lick, MD  Pomegranate Seed 80 % OIL by Does not apply route. 1 table spoon a day    Yes [provider]  Potassium 99 MG TABS Take  by mouth daily.     Yes [provider]  Probiotic Product (PROBIOTIC DAILY PO) Take by mouth daily.   Yes [provider]  rosuvastatin (CRESTOR) 20 MG tablet Take 0.5 tablets (10 mg total) by mouth daily. 08/21/16  Yes Burns, Claudina Lick, MD  tamsulosin (FLOMAX) 0.4 MG CAPS capsule TAKE 1 CAPSULE (0.4 MG TOTAL) BY MOUTH DAILY. 10/21/16  Yes Burns, Claudina Lick, MD  Testosterone 20.25 MG/ACT (1.62%) GEL Place 2 Pump onto the skin daily. 09/12/16  Yes Burns, Claudina Lick, MD  Turmeric 500 MG CAPS Take by mouth.     Yes [provider]  vitamin C (ASCORBIC ACID) 500 MG tablet Take 500 mg by mouth 2 (two) times daily.   Yes [provider]  nebivolol (BYSTOLIC) 10 MG tablet Take 1 tablet (10 mg total) by mouth daily. Patient not taking: Reported on 11/15/2016 10/28/16   [name], MD    Family History Family History  Problem Relation Age of Onset  . Heart attack Father 85  . Diabetes Mother   . Stroke Mother 59  . Hypertension Mother   . Leukemia Sister   . Colon cancer Paternal Uncle   . Aortic aneurysm Brother        abdominal  . Heart disease Brother   . Atrial fibrillation Brother   . Cancer Maternal Uncle         X3:prostate , renal, bone   . Parkinson's disease Sister   . Stroke Sister   . Stroke Sister   . Stroke Sister   . Atrial fibrillation Sister     Social History Social History   Tobacco Use  . Smoking status: Former Smoker    Last attempt to quit: 01/07/1998    Years since quitting: 18.8  . Smokeless tobacco: Never Used  . Tobacco comment: smoked 1963- 2000, up to 1 ppd  Substance Use Topics  . Alcohol use: No  . Drug use: No     Allergies   Latex; Amlodipine; and Simvastatin   Review of Systems Review of Systems  Constitutional:       Per HPI, otherwise negative  HENT:       Per HPI, otherwise negative  Respiratory:       Per HPI, otherwise negative  Cardiovascular:       Per HPI, otherwise negative  Gastrointestinal:  Negative for vomiting.  Endocrine:       Negative aside from HPI  Genitourinary:       Neg aside from HPI   Musculoskeletal:       Per HPI, otherwise negative  Skin: Negative.   Neurological: Negative for syncope.     Physical Exam Updated Vital Signs BP (!) 169/77   Pulse Marland Kitchen)  57   Temp 97.9 F (36.6 C) (Oral)   Resp 16   Ht 6' (1.829 m)   Wt 85.3 kg (188 lb)   SpO2 97%   BMI 25.50 kg/m   Physical Exam  Constitutional: He is oriented to person, place, and time. He appears well-developed. No distress.  HENT:  Head: Normocephalic and atraumatic.  Eyes: Conjunctivae and EOM are normal.  Cardiovascular: Normal rate and regular rhythm.  Pulmonary/Chest: Effort normal. No stridor. No respiratory distress.  Abdominal: He exhibits no distension.  Once pressure is applied to the abdomen, the patient describes pain with palpation throughout aside from the right upper quadrant, right lower quadrant. Some guarding with palpation in the periumbilical area.  Musculoskeletal: He exhibits no edema.  Neurological: He is alert and oriented to person, place, and time.  Skin: Skin is warm and dry.  Psychiatric: He has a normal mood and affect.  Nursing note and vitals reviewed.    ED Treatments / Results  Labs (all labs ordered are listed, but only abnormal results are displayed) Labs Reviewed  LIPASE, BLOOD - Abnormal; Notable for the following components:      Result Value   Lipase 81 (*)    All other components within normal limits  COMPREHENSIVE METABOLIC PANEL - Abnormal; Notable for the following components:   Chloride 100 (*)    All other components within normal limits  CBC - Abnormal; Notable for the following components:   RBC 5.86 (*)    Hemoglobin 17.1 (*)    All other components within normal limits  URINALYSIS, ROUTINE W REFLEX MICROSCOPIC - Abnormal; Notable for the following components:   Color, Urine STRAW (*)    All other components within normal limits     EKG  EKG Interpretation None       Radiology Ct Abdomen Pelvis W Contrast  Result Date: 11/15/2016 CLINICAL DATA:  Initial evaluation for acute diffuse abdominal pain radiating to back. EXAM: CT ABDOMEN AND PELVIS WITH CONTRAST TECHNIQUE: Multidetector CT imaging of the abdomen and pelvis was performed using the standard protocol following bolus administration of intravenous contrast. CONTRAST:  <See Chart> ISOVUE-300 IOPAMIDOL (ISOVUE-300) INJECTION 61% COMPARISON:  None. FINDINGS: Lower chest: Scattered emphysematous changes present within the visualized lung bases. Superimpose fibrotic lung changes noted at the posterior right lower lobe. Visualized lungs are otherwise clear. Hepatobiliary: Liver demonstrates a normal contrast enhanced appearance. Gallbladder within normal limits. No biliary dilatation. Pancreas: Fatty infiltration the pancreas noted. There is subtle hazy fat stranding about the pancreatic head, suspicious for possible mild/early acute pancreatitis. No loculated peripancreatic collections or other complication. Spleen: Spleen within normal limits. Adrenals/Urinary Tract: Adrenal glands are normal. Kidneys equal in size with symmetric enhancement. Scattered multifocal cortical scarring. Subcentimeter hypodensities within the right kidney are too small the characterize, but statistically likely reflects small cyst. No nephrolithiasis, hydronephrosis, or focal enhancing renal mass. No hydroureter. Partially distended bladder within normal limits. Stomach/Bowel: Stomach within normal limits. Mild wall thickening and hazy stranding about the second- third portion of the duodenum related to the mild inflammatory a shin about the adjacent pancreas. No evidence for bowel obstruction. No significant diverticular disease. No other acute inflammatory changes seen about the bowels. Moderate retained stool within the colon. Vascular/Lymphatic: Extensive calcified noncalcified atheromatous  plaque throughout the intra-abdominal aorta. No aneurysm. No adenopathy. Reproductive: Prostate enlarged measuring 5.4 cm in diameter. Other: Small fat containing bilateral inguinal hernias noted. No free air or fluid. Musculoskeletal: No acute osseus abnormality. No worrisome lytic  or blastic osseous lesions. IMPRESSION: 1. Mild hazy inflammatory stranding about the pancreatic head, suggesting acute pancreatitis. Correlation with serum lipase recommended. No complication. 2. No other acute intra-abdominal or pelvic process. 3. Advanced aorto bi-iliac atherosclerotic disease. 4. Moderate retained stool throughout the colon, suggesting constipation. Electronically Signed   By: Jeannine Boga M.D.   On: 11/15/2016 20:21    Procedures Procedures (including critical care time)  Medications Ordered in ED Medications  0.9 %  sodium chloride infusion ( Intravenous New Bag/Given 11/15/16 1920)  iopamidol (ISOVUE-300) 61 % injection (not administered)  ibuprofen (ADVIL,MOTRIN) tablet 600 mg (600 mg Oral Given 11/15/16 1444)  iopamidol (ISOVUE-300) 61 % injection (100 mLs  Contrast Given 11/15/16 1937)     Initial Impression / Assessment and Plan / ED Course  I have reviewed the triage vital signs and the nursing notes.  Pertinent labs & imaging results that were available during my care of the patient were reviewed by me and considered in my medical decision making (see chart for details).    9:25 PM Patient in no distress, awake, alert, states that he feels better. We had a lengthy conversation about all findings including evidence for pancreatitis. Via a shared decision making the patient opted for discharge with close outpatient follow-up with his gastroenterologist. Patient and wife acknowledged importance of clear liquid diet, return precautions, follow-up instructions. But with no evidence for peritonitis, bacteremia, sepsis, and with CT demonstrating pancreatitis, consistent with the  patient's story, the patient was discharged in stable condition.  Final Clinical Impressions(s) / ED Diagnoses  Acute pancreatitis   Carmin Muskrat, MD 11/15/16 2126

## 2016-11-15 NOTE — ED Notes (Signed)
Pt reports he believes the ibuprofen is wearing off. States his pain is coming back.

## 2016-11-15 NOTE — ED Triage Notes (Signed)
Pt endorses generalized abd pain since wed, denies n/v/d. Hypertensive in triage, all over VSS.

## 2016-11-15 NOTE — ED Notes (Signed)
Pt reports abd pain in the center of his abd. Pt states this all started on Tuesday.

## 2016-11-18 ENCOUNTER — Telehealth: Payer: Self-pay | Admitting: Gastroenterology

## 2016-11-18 NOTE — Telephone Encounter (Signed)
Patient was seen in the ED for pancreatitis.  He will come in and see Tye Savoy RNP tomorrow at 9:00 am

## 2016-11-19 ENCOUNTER — Encounter: Payer: Self-pay | Admitting: Nurse Practitioner

## 2016-11-19 ENCOUNTER — Other Ambulatory Visit (INDEPENDENT_AMBULATORY_CARE_PROVIDER_SITE_OTHER): Payer: Medicare Other

## 2016-11-19 ENCOUNTER — Ambulatory Visit: Payer: Medicare Other | Admitting: Nurse Practitioner

## 2016-11-19 ENCOUNTER — Other Ambulatory Visit: Payer: Self-pay | Admitting: Internal Medicine

## 2016-11-19 VITALS — BP 160/74 | Ht 72.0 in | Wt 185.2 lb

## 2016-11-19 DIAGNOSIS — K859 Acute pancreatitis without necrosis or infection, unspecified: Secondary | ICD-10-CM

## 2016-11-19 DIAGNOSIS — K861 Other chronic pancreatitis: Secondary | ICD-10-CM | POA: Diagnosis not present

## 2016-11-19 DIAGNOSIS — K219 Gastro-esophageal reflux disease without esophagitis: Secondary | ICD-10-CM

## 2016-11-19 LAB — CBC WITH DIFFERENTIAL/PLATELET
Basophils Absolute: 0 10*3/uL (ref 0.0–0.1)
Basophils Relative: 0.6 % (ref 0.0–3.0)
EOS PCT: 0.8 % (ref 0.0–5.0)
Eosinophils Absolute: 0 10*3/uL (ref 0.0–0.7)
HCT: 54.1 % — ABNORMAL HIGH (ref 39.0–52.0)
HEMOGLOBIN: 17.8 g/dL — AB (ref 13.0–17.0)
Lymphocytes Relative: 23.4 % (ref 12.0–46.0)
Lymphs Abs: 1.4 10*3/uL (ref 0.7–4.0)
MCHC: 32.8 g/dL (ref 30.0–36.0)
MCV: 88.2 fl (ref 78.0–100.0)
MONO ABS: 0.6 10*3/uL (ref 0.1–1.0)
MONOS PCT: 10.9 % (ref 3.0–12.0)
Neutro Abs: 3.8 10*3/uL (ref 1.4–7.7)
Neutrophils Relative %: 64.3 % (ref 43.0–77.0)
Platelets: 190 10*3/uL (ref 150.0–400.0)
RBC: 6.14 Mil/uL — ABNORMAL HIGH (ref 4.22–5.81)
RDW: 16.1 % — AB (ref 11.5–15.5)
WBC: 5.9 10*3/uL (ref 4.0–10.5)

## 2016-11-19 LAB — TRIGLYCERIDES: Triglycerides: 96 mg/dL (ref 0.0–149.0)

## 2016-11-19 LAB — LIPASE: Lipase: 38 U/L (ref 11.0–59.0)

## 2016-11-19 NOTE — Progress Notes (Signed)
HPI:  Patient is a 71 year old male known to [name] Plan.  He had a screening colonoscopy in August of this year.  Patient is here with his wife for a post ED follow-up.    Last Tuesday patient developed acute upper abdominal pain exacerbated by eating.  Pain was pretty much constant throughout the day and radiating through / around to his back. No fevers. Some nausea without vomiting. Pepto didn't help.  Pain got worse and by Friday he was in the ED.   Lipase was only mildly elevated at 81, LFTs were normal.  WBC normal. CT scan of the abdomen and pelvis demonstrated fatty infiltration of the pancreas, subtle hazy fat stranding about the pancreatic head suspicious for mild or early acute pancreatitis.   No gallstones were seen.  No biliary duct dilation. He was given IV fluids, felt stable for discharge home on Ultram which didn't help, Motrin worked better.  He has been on clear liquids / cracker as advised by EDP.  Patient does not drink alcohol.  He had recently started medication containing HCTZ.  No other medication changes.  He has had some mild weight loss over the last couple of weeks.  Recalls being hospitalized about 20 years ago with pancreatitis, cause was never determined. No [place] of pancreatitic cancers.   Past Medical History:  Diagnosis Date  . Arthritis   . CAD (coronary artery disease)   . COPD (chronic obstructive pulmonary disease) ([place])   . Elevated glycated hemoglobin   . Emphysema of lung ([place])   . GERD (gastroesophageal reflux disease)   . Heart murmur   . Hyperlipidemia   . Hypertension   . Hypopituitarism ([place])    hypofunction, Dr.Dennish Kohut  . Osteopenia    Dr Wilson Singer  . Pancreatitis   . Peyronie disease   . Testosterone deficiency    Dr Wilson Singer     Past Surgical History:  Procedure Laterality Date  . bone spur  2006   5th toe bilaterally  . COLONOSCOPY  2007   negative; Dr Sharlett Iles  . CORONARY ARTERY BYPASS GRAFT  2001   2 vessels  . fracture  heel  1967   Bil/ due to fall off ladder  . Fractured calcaneus  1969   bilaterally w/ ankle fusion  . HERNIA REPAIR  1984   right inguinal hernia  . MENISECTOMY  2007   R medial, Dr. Lorin Mercy  . [place]   left  . UPPER GASTROINTESTINAL ENDOSCOPY  2007   GERD, Dr Sharlett Iles  . VASECTOMY     Family History  Problem Relation Age of Onset  . Heart attack Father 12  . Diabetes Mother   . Stroke Mother 66  . Hypertension Mother   . Leukemia Sister   . Colon cancer Paternal Uncle   . Aortic aneurysm Brother        abdominal  . Heart disease Brother   . Atrial fibrillation Brother   . Cancer Maternal Uncle         X3:prostate , renal, bone   . Parkinson's disease Sister   . Stroke Sister   . Stroke Sister   . Stroke Sister   . Atrial fibrillation Sister    Social History   Tobacco Use  . Smoking status: Former Smoker    Last attempt to quit: 01/07/1998    Years since quitting: 18.8  . Smokeless tobacco: Never Used  . Tobacco comment: smoked 1963- 2000, up to  1 ppd  Substance Use Topics  . Alcohol use: No  . Drug use: No   Current Outpatient Medications  Medication Sig Dispense Refill  . aspirin EC 81 MG tablet Take 1 tablet (81 mg total) by mouth daily. (Patient taking differently: Take 81 mg at bedtime by mouth. )    . bromocriptine (PARLODEL) 2.5 MG tablet Take 7.5 mg by mouth daily.     . Calcium-Magnesium-Zinc (CAL-MAG-ZINC PO) Take by mouth daily.      . Cholecalciferol (VITAMIN D3) 2000 UNITS TABS Take by mouth. 2000 iu in the am, 1000 iu in the pm    . Coenzyme Q10 (COQ10) 100 MG CAPS Take by mouth daily.      Marland Kitchen CRANBERRY PO Take by mouth. 1 tablespoon daily     . diclofenac sodium (VOLTAREN) 1 % GEL Apply topically daily.     . Flaxseed, Linseed, (FLAX SEED OIL PO) Take by mouth.      . Garlic 6144 MG CAPS Take 1,000 mg by mouth 2 (two) times daily.    . Iodine, Kelp, (KELP PO) Take by mouth daily.    Marland Kitchen L-Arginine 500 MG CAPS Take by mouth daily.       Marland Kitchen lisinopril-hydrochlorothiazide (PRINZIDE,ZESTORETIC) 10-12.5 MG tablet Take 1 tablet by mouth daily. 30 tablet 1  . omeprazole (PRILOSEC) 20 MG capsule Take 1 capsule (20 mg total) by mouth daily. 90 capsule 3  . Pomegranate Seed 80 % OIL by Does not apply route. 1 table spoon a day     . Potassium 99 MG TABS Take by mouth daily.      . Probiotic Product (PROBIOTIC DAILY PO) Take by mouth daily.    . rosuvastatin (CRESTOR) 20 MG tablet Take 0.5 tablets (10 mg total) by mouth daily. 45 tablet 3  . tamsulosin (FLOMAX) 0.4 MG CAPS capsule TAKE 1 CAPSULE (0.4 MG TOTAL) BY MOUTH DAILY. 90 capsule 3  . Testosterone 20.25 MG/ACT (1.62%) GEL Place 2 Pump onto the skin daily. 75 g 5  . traMADol (ULTRAM) 50 MG tablet Take 1 tablet (50 mg total) every 6 (six) hours as needed by mouth. 15 tablet 0  . Turmeric 500 MG CAPS Take by mouth.      . vitamin C (ASCORBIC ACID) 500 MG tablet Take 500 mg by mouth 2 (two) times daily.     No current facility-administered medications for this visit.    Allergies  Allergen Reactions  . Latex     Contact  dermatitis  . Amlodipine     Edema   . Simvastatin     Joint pain     Review of Systems: All systems reviewed and negative except where noted in HPI.    Physical Exam: BP (!) 160/74   Ht 6' (1.829 m)   Wt 185 lb 3.2 oz (84 kg)   BMI 25.12 kg/m   Constitutional:  Well-developed, white male in no acute distress. Psychiatric: Normal mood and affect. Behavior is normal. EENT: Pupils normal.  Conjunctivae are normal. No scleral icterus. Neck supple.  Cardiovascular: Normal rate, regular rhythm. No edema Pulmonary/chest: Effort normal and breath sounds normal. No wheezing, rales or rhonchi. Abdominal: Soft, nondistended. Nontender. Bowel sounds active throughout. There are no masses palpable. No hepatomegaly. Lymphadenopathy: No cervical adenopathy noted. Neurological: Alert and oriented to person place and time. Skin: Skin is warm and dry. No  rashes noted.   ASSESSMENT AND PLAN:  71 year old male with acute pancreatitis documented by CT scan from ED  visit.  Serum calcium normal. LFTs normal.  No biliary duct dilation.  No cholelithiasis on CT scan.  Nondrinker.  Suspect pancreatitis may have been related to initiation of HCTZ but would like to definitely exclude gallstones.  -Obtain abdominal ultrasound -will check triglyceride level.  -he is feeling better now. Recommended trial of low fat solids and see how he does -I have asked patient to stop med containing HCTZ and call PCP to discuss an alternative  GERD, asymptomatic on daily PPI  Hypopituitarism. On Parlodel   Tye Savoy, NP  11/19/2016, 9:20 AM  Cc: [name], MD

## 2016-11-19 NOTE — Telephone Encounter (Signed)
We can stop the lisinopril.    Other options for his BP include:  Continuing the hydrochlorothiazide but increasing it to 25 mg daily  We can also try bystolic at 5 mg daily and increase it if needed.    Both meds are pending.

## 2016-11-19 NOTE — Telephone Encounter (Signed)
Patient states he does not believe lisinopril is controlling his blood pressure.  Also states he was diagnosis with pancreatitis and was told by PA in GI that it could be caused by the lisinopril.  Patient requesting call back in regard.

## 2016-11-19 NOTE — Patient Instructions (Signed)
If you are age 71 or older, your body mass index should be between 23-30. Your Body mass index is 25.12 kg/m. If this is out of the aforementioned range listed, please consider follow up with your Primary Care Provider.  If you are age 60 or younger, your body mass index should be between 19-25. Your Body mass index is 25.12 kg/m. If this is out of the aformentioned range listed, please consider follow up with your Primary Care Provider.   Your physician has requested that you go to the basement for lab work before leaving today.  You have been scheduled for an abdominal ultrasound at [facility] (1st floor of hospital) on 11/22/16 at 930 am. Please arrive 15 minutes prior to your appointment for registration. Make certain not to have anything to eat or drink after midnight the day prior to your appointment. Should you need to reschedule your appointment, please contact radiology at [phone]. This test typically takes about 30 minutes to perform.   STOP HCTZ.  Call primary care physician about alternative.  Thank you for choosing me and [facility].   Tye Savoy, NP

## 2016-11-19 NOTE — Telephone Encounter (Signed)
Called pt no answer LMOM RTC.../lmb 

## 2016-11-19 NOTE — Telephone Encounter (Signed)
Please return call at [phone]

## 2016-11-20 ENCOUNTER — Encounter: Payer: Self-pay | Admitting: Nurse Practitioner

## 2016-11-20 MED ORDER — VALSARTAN 40 MG PO TABS: 40.0000 mg | ORAL_TABLET | Freq: Every day | ORAL | 3 refills | Status: DC

## 2016-11-20 NOTE — Telephone Encounter (Signed)
Are those readings off medication?  If yes - start just bystolic 5 mg daily.  Stop lisinopril-hctz

## 2016-11-20 NOTE — Telephone Encounter (Signed)
Spoke with pt to inform.  

## 2016-11-20 NOTE — Telephone Encounter (Signed)
Yes, sent to [facility]

## 2016-11-20 NOTE — Telephone Encounter (Signed)
Spoke with pt and he states that in oct we took him off the Bystolic bc it was only lowering his heart rate and not his blood pressure. Pt would like to know if he can go back on the Valsartan bc he never had trouble with his BP while on the Valsartan.

## 2016-11-20 NOTE — Progress Notes (Signed)
Reviewed and agree with initial management plan.  [place] T. Cherylin Waguespack, MD FACG 

## 2016-11-20 NOTE — Telephone Encounter (Signed)
Spoke with pt and pt states that he was told that the HCTZ is the medication that is causing pancreatitis. His reading yesterday were   7am- 135/74   HR 67 930am- 160/74    12pm- 133/74 HR 70 10pm- 138/78  HR 65  6 days after starting the HCTZ he developed pancreatitis.

## 2016-11-22 ENCOUNTER — Ambulatory Visit (HOSPITAL_COMMUNITY)
Admission: RE | Admit: 2016-11-22 | Discharge: 2016-11-22 | Disposition: A | Discharge status: Home / Self Care | Payer: Medicare Other | Source: Ambulatory Visit | Attending: Nurse Practitioner | Admitting: Nurse Practitioner

## 2016-11-22 DIAGNOSIS — K859 Acute pancreatitis without necrosis or infection, unspecified: Secondary | ICD-10-CM

## 2016-12-10 DIAGNOSIS — I10 Essential (primary) hypertension: Secondary | ICD-10-CM | POA: Diagnosis not present

## 2016-12-10 DIAGNOSIS — E214 Other specified disorders of parathyroid gland: Secondary | ICD-10-CM | POA: Diagnosis not present

## 2016-12-10 DIAGNOSIS — E291 Testicular hypofunction: Secondary | ICD-10-CM | POA: Diagnosis not present

## 2016-12-10 DIAGNOSIS — E349 Endocrine disorder, unspecified: Secondary | ICD-10-CM | POA: Diagnosis not present

## 2016-12-10 DIAGNOSIS — Z125 Encounter for screening for malignant neoplasm of prostate: Secondary | ICD-10-CM | POA: Diagnosis not present

## 2016-12-19 DIAGNOSIS — I25810 Atherosclerosis of coronary artery bypass graft(s) without angina pectoris: Secondary | ICD-10-CM | POA: Diagnosis not present

## 2016-12-19 DIAGNOSIS — E291 Testicular hypofunction: Secondary | ICD-10-CM | POA: Diagnosis not present

## 2016-12-19 DIAGNOSIS — E785 Hyperlipidemia, unspecified: Secondary | ICD-10-CM | POA: Diagnosis not present

## 2016-12-19 DIAGNOSIS — D352 Benign neoplasm of pituitary gland: Secondary | ICD-10-CM | POA: Diagnosis not present

## 2016-12-19 DIAGNOSIS — Z87898 Personal history of other specified conditions: Secondary | ICD-10-CM | POA: Diagnosis not present

## 2017-03-14 DIAGNOSIS — L570 Actinic keratosis: Secondary | ICD-10-CM | POA: Diagnosis not present

## 2017-03-14 DIAGNOSIS — L700 Acne vulgaris: Secondary | ICD-10-CM | POA: Diagnosis not present

## 2017-03-14 DIAGNOSIS — Z85828 Personal history of other malignant neoplasm of skin: Secondary | ICD-10-CM | POA: Diagnosis not present

## 2017-03-14 DIAGNOSIS — L821 Other seborrheic keratosis: Secondary | ICD-10-CM | POA: Diagnosis not present

## 2017-03-14 DIAGNOSIS — D485 Neoplasm of uncertain behavior of skin: Secondary | ICD-10-CM | POA: Diagnosis not present

## 2017-04-01 ENCOUNTER — Other Ambulatory Visit: Payer: Self-pay | Admitting: Internal Medicine

## 2017-04-01 MED ORDER — TESTOSTERONE 20.25 MG/ACT (1.62%) TD GEL: 2.0000 | Freq: Every day | TRANSDERMAL | 5 refills | Status: DC

## 2017-04-01 NOTE — Telephone Encounter (Signed)
[place] Controlled Substance Database checked. Last filled on 03/05/17

## 2017-04-05 ENCOUNTER — Other Ambulatory Visit: Payer: Self-pay | Admitting: Internal Medicine

## 2017-05-07 ENCOUNTER — Telehealth: Payer: Self-pay | Admitting: Emergency Medicine

## 2017-05-07 NOTE — Telephone Encounter (Signed)
Called patient to schedule AWV. Pt stated he only wanted to do his CPE with Dr Quay Burow and declined AWV at this time.

## 2017-05-15 NOTE — Telephone Encounter (Signed)
Can you close this please, Thank you.

## 2017-06-09 DIAGNOSIS — E291 Testicular hypofunction: Secondary | ICD-10-CM | POA: Diagnosis not present

## 2017-06-16 DIAGNOSIS — D352 Benign neoplasm of pituitary gland: Secondary | ICD-10-CM | POA: Diagnosis not present

## 2017-06-16 DIAGNOSIS — I25810 Atherosclerosis of coronary artery bypass graft(s) without angina pectoris: Secondary | ICD-10-CM | POA: Diagnosis not present

## 2017-06-16 DIAGNOSIS — Z87898 Personal history of other specified conditions: Secondary | ICD-10-CM | POA: Diagnosis not present

## 2017-06-16 DIAGNOSIS — E291 Testicular hypofunction: Secondary | ICD-10-CM | POA: Diagnosis not present

## 2017-06-16 DIAGNOSIS — E785 Hyperlipidemia, unspecified: Secondary | ICD-10-CM | POA: Diagnosis not present

## 2017-08-19 DIAGNOSIS — H6522 Chronic serous otitis media, left ear: Secondary | ICD-10-CM | POA: Diagnosis not present

## 2017-08-19 DIAGNOSIS — H6121 Impacted cerumen, right ear: Secondary | ICD-10-CM | POA: Diagnosis not present

## 2017-08-19 DIAGNOSIS — H6061 Unspecified chronic otitis externa, right ear: Secondary | ICD-10-CM | POA: Diagnosis not present

## 2017-08-20 ENCOUNTER — Other Ambulatory Visit: Payer: Self-pay | Admitting: Internal Medicine

## 2017-08-21 NOTE — Progress Notes (Signed)
Subjective:    Patient ID: Patrick Harris, male    DOB: 04-16-1945, 72 y.o.   MRN: [id number]  HPI He is here for a physical exam.   He denies any changes in his health.  His only concern is his endocrinologist would ideally like him to decrease or come off of his testosterone due to the increased risk of heart attacks.  He has a history of CAD s/p CABG.  He is not currently following with a cardiologist but has one he plans on making an appt with to discuss this.  His dose was decreased recently and he does feel weaker and more fatigued.     Medications and allergies reviewed with patient and updated if appropriate.  Patient Active Problem List   Diagnosis Date Noted  . Prediabetes 08/22/2017  . Umbilical hernia 41/32/4401  . Varicose veins of both lower extremities 05/22/2016  . Vitamin D deficiency 08/21/2015  . Hypertension 01/24/2015  . Family history of prostate cancer 08/19/2014  . Osteopenia 08/07/2009  . Hyperlipidemia 08/12/2007  . PITUITARY INSUFFICIENCY 08/05/2007  . Testosterone deficiency 08/05/2007  . Coronary atherosclerosis 08/05/2007  . GERD 08/05/2007  . BPH without obstruction/lower urinary tract symptoms 08/05/2007    Current Outpatient Medications on File Prior to Visit  Medication Sig Dispense Refill  . aspirin EC 81 MG tablet Take 1 tablet (81 mg total) by mouth daily. (Patient taking differently: Take 81 mg at bedtime by mouth. )    . bromocriptine (PARLODEL) 2.5 MG tablet Take 7.5 mg by mouth daily.     . Calcium-Magnesium-Zinc (CAL-MAG-ZINC PO) Take by mouth daily.      . Cholecalciferol (VITAMIN D3) 2000 UNITS TABS Take by mouth. 2000 iu in the am, 1000 iu in the pm    . Coenzyme Q10 (COQ10) 100 MG CAPS Take by mouth daily.      Marland Kitchen CRANBERRY PO Take by mouth. 1 tablespoon daily     . Flaxseed, Linseed, (FLAX SEED OIL PO) Take by mouth.      . Garlic 0272 MG CAPS Take 1,000 mg by mouth 2 (two) times daily.    . Iodine, Kelp, (KELP PO) Take by  mouth daily.    Marland Kitchen L-Arginine 500 MG CAPS Take by mouth daily.      . Pomegranate Seed 80 % OIL by Does not apply route. 1 table spoon a day     . Potassium 99 MG TABS Take by mouth daily.      . Probiotic Product (PROBIOTIC DAILY PO) Take by mouth daily.    . Testosterone 20.25 MG/ACT (1.62%) GEL Place 2 Pump onto the skin daily. 75 g 5  . Turmeric 500 MG CAPS Take by mouth.      . vitamin C (ASCORBIC ACID) 500 MG tablet Take 500 mg by mouth 2 (two) times daily.    Marland Kitchen neomycin-polymyxin-hydrocortisone (CORTISPORIN) 3.5-10000-1 OTIC suspension      No current facility-administered medications on file prior to visit.     Past Medical History:  Diagnosis Date  . Arthritis   . CAD (coronary artery disease)   . COPD (chronic obstructive pulmonary disease) ([place])   . Elevated glycated hemoglobin   . Emphysema of lung ([place])   . GERD (gastroesophageal reflux disease)   . Heart murmur   . Hyperlipidemia   . Hypertension   . Hypopituitarism ([place])    hypofunction, Dr.Dennish Kohut  . Osteopenia    Dr Wilson Singer  . Pancreatitis   . P[place]ron[place]  disease   . Testosterone deficiency    Dr Wilson Singer    Past Surgical History:  Procedure Laterality Date  . bone spur  2006   5th toe bilaterally  . COLONOSCOPY  2007   negative; Dr Sharlett Iles  . CORONARY ARTERY BYPASS GRAFT  2001   2 vessels  . fracture heel  1967   Bil/ due to fall off ladder  . Fractured calcaneus  1969   bilaterally w/ ankle fusion  . HERNIA REPAIR  1984   right inguinal hernia  . MENISECTOMY  2007   R medial, Dr. Lorin Mercy  . [place]   left  . UPPER GASTROINTESTINAL ENDOSCOPY  2007   GERD, Dr Sharlett Iles  . VASECTOMY      Social History   Socioeconomic History  . Marital status: Married    Spouse name: Not on file  . Number of children: Not on file  . Years of education: Not on file  . Highest education level: Not on file  Occupational History  . Not on file  Social Needs  . Financial resource strain: Not  on file  . Food insecurity:    Worry: Not on file    Inability: Not on file  . Transportation needs:    Medical: Not on file    Non-medical: Not on file  Tobacco Use  . Smoking status: Former Smoker    Last attempt to quit: 01/07/1998    Years since quitting: 19.6  . Smokeless tobacco: Never Used  . Tobacco comment: smoked 1963- 2000, up to 1 ppd  Substance and Sexual Activity  . Alcohol use: No  . Drug use: No  . Sexual activity: Not on file  Lifestyle  . Physical activity:    Days per week: Not on file    Minutes per session: Not on file  . Stress: Not on file  Relationships  . Social connections:    Talks on phone: Not on file    Gets together: Not on file    Attends religious service: Not on file    Active member of club or organization: Not on file    Attends meetings of clubs or organizations: Not on file    Relationship status: Not on file  Other Topics Concern  . Not on file  Social History Narrative  . Not on file    Family History  Problem Relation Age of Onset  . Heart attack Father 41  . Diabetes Mother   . Stroke Mother 38  . Hypertension Mother   . Leukemia Sister   . Colon cancer Paternal Uncle   . Aortic aneurysm Brother        abdominal  . Heart disease Brother   . Atrial fibrillation Brother   . Cancer Maternal Uncle         X3:prostate , renal, bone   . Parkinson's disease Sister   . Stroke Sister   . Stroke Sister   . Stroke Sister   . Atrial fibrillation Sister     Review of Systems  Constitutional: Positive for fatigue. Negative for chills and fever.  Eyes: Negative for visual disturbance.  Respiratory: Negative for cough, shortness of breath and wheezing.   Cardiovascular: Negative for chest pain, palpitations and leg swelling.  Gastrointestinal: Negative for abdominal pain, blood in stool, constipation, diarrhea and nausea.       No gerd  Genitourinary: Positive for difficulty urinating (mild) and urgency. Negative for dysuria and  hematuria.  Musculoskeletal:  Positive for arthralgias and neck pain. Negative for back pain.  Skin: Negative for color change and rash.  Neurological: Negative for dizziness, light-headedness and headaches.  Psychiatric/Behavioral: Negative for dysphoric mood. The patient is not nervous/anxious.        Objective:   Vitals:   08/22/17 0802  BP: 136/84  Pulse: (!) 50  Resp: 16  Temp: 98.7 F (37.1 C)  SpO2: 96%   Filed Weights   08/22/17 0802  Weight: 181 lb (82.1 kg)   Body mass index is 24.55 kg/m.  Wt Readings from Last 3 Encounters:  08/22/17 181 lb (82.1 kg)  11/19/16 185 lb 3.2 oz (84 kg)  11/15/16 188 lb (85.3 kg)     Physical Exam Constitutional: He appears well-developed and well-nourished. No distress.  HENT:  Head: Normocephalic and atraumatic.  Right Ear: External ear normal.  Left Ear: External ear normal.  Mouth/Throat: Oropharynx is clear and moist.  Normal ear canals and TM b/l  Eyes: Conjunctivae and EOM are normal.  Neck: Neck supple. No tracheal deviation present. No thyromegaly present.  No carotid bruit  Cardiovascular: Normal rate, regular rhythm, normal heart sounds and intact distal pulses.  No murmur heard. Pulmonary/Chest: Effort normal and breath sounds normal. No respiratory distress. He has no wheezes. He has no rales.  Abdominal: Soft. Small umbilical hernia - non tender, reducible.  He exhibits no distension. There is no tenderness.  Genitourinary: deferred  Musculoskeletal: He exhibits no edema.  Lymphadenopathy:   He has no cervical adenopathy.  Skin: Skin is warm and dry. He is not diaphoretic.  Psychiatric: He has a normal mood and affect. His behavior is normal.         Assessment & Plan:   Physical exam: Screening blood work  ordered Immunizations    Discussed shingrix, flu this fall Colonoscopy   Up to date  Eye exams   Up to date - goes every year EKG   Done 01/2015 Exercise  Uses band for exercises, very  active Weight  Normal BMI Skin  No concerns Substance abuse  none  See Problem List for Assessment and Plan of chronic medical problems.  FU in 6 months

## 2017-08-21 NOTE — Patient Instructions (Addendum)
Test(s) ordered today. Your results will be released to [place] (or called to you) after review, usually within 72hours after test completion. If any changes need to be made, you will be notified at that same time.  All other Health Maintenance issues reviewed.   All recommended immunizations and age-appropriate screenings are up-to-date or discussed.  No immunizations administered today.   Medications reviewed and updated.  No changes recommended at this time.  Your prescription(s) have been submitted to your pharmacy. Please take as directed and contact our office if you believe you are having problem(s) with the medication(s).   Please followup in 6 months    Health Maintenance, Male A healthy lifestyle and preventive care is important for your health and wellness. Ask your health care provider about what schedule of regular examinations is right for you. What should I know about weight and diet? Eat a Healthy Diet  Eat plenty of vegetables, fruits, whole grains, low-fat dairy products, and lean protein.  Do not eat a lot of foods high in solid fats, added sugars, or salt.  Maintain a Healthy Weight Regular exercise can help you achieve or maintain a healthy weight. You should:  Do at least 150 minutes of exercise each week. The exercise should increase your heart rate and make you sweat (moderate-intensity exercise).  Do strength-training exercises at least twice a week.  Watch Your Levels of Cholesterol and Blood Lipids  Have your blood tested for lipids and cholesterol every 5 years starting at 72 years of age. If you are at high risk for heart disease, you should start having your blood tested when you are 72 years old. You may need to have your cholesterol levels checked more often if: ? Your lipid or cholesterol levels are high. ? You are older than 72 years of age. ? You are at high risk for heart disease.  What should I know about cancer screening? Many types of  cancers can be detected early and may often be prevented. Lung Cancer  You should be screened every year for lung cancer if: ? You are a current smoker who has smoked for at least 30 years. ? You are a former smoker who has quit within the past 15 years.  Talk to your health care provider about your screening options, when you should start screening, and how often you should be screened.  Colorectal Cancer  Routine colorectal cancer screening usually begins at 72 years of age and should be repeated every 5-10 years until you are 72 years old. You may need to be screened more often if early forms of precancerous polyps or small growths are found. Your health care provider may recommend screening at an earlier age if you have risk factors for colon cancer.  Your health care provider may recommend using home test kits to check for hidden blood in the stool.  A small camera at the end of a tube can be used to examine your colon (sigmoidoscopy or colonoscopy). This checks for the earliest forms of colorectal cancer.  Prostate and Testicular Cancer  Depending on your age and overall health, your health care provider may do certain tests to screen for prostate and testicular cancer.  Talk to your health care provider about any symptoms or concerns you have about testicular or prostate cancer.  Skin Cancer  Check your skin from head to toe regularly.  Tell your health care provider about any new moles or changes in moles, especially if: ? There is a  change in a mole's size, shape, or color. ? You have a mole that is larger than a pencil eraser.  Always use sunscreen. Apply sunscreen liberally and repeat throughout the day.  Protect yourself by wearing long sleeves, pants, a wide-brimmed hat, and sunglasses when outside.  What should I know about heart disease, diabetes, and high blood pressure?  If you are 84-83 years of age, have your blood pressure checked every 3-5 years. If you are  35 years of age or older, have your blood pressure checked every year. You should have your blood pressure measured twice-once when you are at a hospital or clinic, and once when you are not at a hospital or clinic. Record the average of the two measurements. To check your blood pressure when you are not at a hospital or clinic, you can use: ? An automated blood pressure machine at a pharmacy. ? A home blood pressure monitor.  Talk to your health care provider about your target blood pressure.  If you are between 15-60 years old, ask your health care provider if you should take aspirin to prevent heart disease.  Have regular diabetes screenings by checking your fasting blood sugar level. ? If you are at a normal weight and have a low risk for diabetes, have this test once every three years after the age of 67. ? If you are overweight and have a high risk for diabetes, consider being tested at a younger age or more often.  A one-time screening for abdominal aortic aneurysm (AAA) by ultrasound is recommended for men aged 24-75 years who are current or former smokers. What should I know about preventing infection? Hepatitis B If you have a higher risk for hepatitis B, you should be screened for this virus. Talk with your health care provider to find out if you are at risk for hepatitis B infection. Hepatitis C Blood testing is recommended for:  Everyone born from [birth year] through 1965.  Anyone with known risk factors for hepatitis C.  Sexually Transmitted Diseases (STDs)  You should be screened each year for STDs including gonorrhea and chlamydia if: ? You are sexually active and are younger than 72 years of age. ? You are older than 72 years of age and your health care provider tells you that you are at risk for this type of infection. ? Your sexual activity has changed since you were last screened and you are at an increased risk for chlamydia or gonorrhea. Ask your health care provider if you  are at risk.  Talk with your health care provider about whether you are at high risk of being infected with HIV. Your health care provider may recommend a prescription medicine to help prevent HIV infection.  What else can I do?  Schedule regular health, dental, and eye exams.  Stay current with your vaccines (immunizations).  Do not use any tobacco products, such as cigarettes, chewing tobacco, and e-cigarettes. If you need help quitting, ask your health care provider.  Limit alcohol intake to no more than 2 drinks per day. One drink equals 12 ounces of beer, 5 ounces of wine, or 1 ounces of hard liquor.  Do not use street drugs.  Do not share needles.  Ask your health care provider for help if you need support or information about quitting drugs.  Tell your health care provider if you often feel depressed.  Tell your health care provider if you have ever been abused or do not feel safe at home.  This information is not intended to replace advice given to you by your health care provider. Make sure you discuss any questions you have with your health care provider. Document Released: 06/22/2007 Document Revised: 08/23/2015 Document Reviewed: 09/27/2014 Elsevier Interactive Patient Education  [employer].

## 2017-08-22 ENCOUNTER — Other Ambulatory Visit (INDEPENDENT_AMBULATORY_CARE_PROVIDER_SITE_OTHER): Payer: Medicare Other

## 2017-08-22 ENCOUNTER — Encounter: Payer: Self-pay | Admitting: Internal Medicine

## 2017-08-22 ENCOUNTER — Ambulatory Visit (INDEPENDENT_AMBULATORY_CARE_PROVIDER_SITE_OTHER): Payer: Medicare Other | Admitting: Internal Medicine

## 2017-08-22 VITALS — BP 136/84 | HR 50 | Temp 98.7°F | Resp 16 | Ht 72.0 in | Wt 181.0 lb

## 2017-08-22 DIAGNOSIS — E782 Mixed hyperlipidemia: Secondary | ICD-10-CM | POA: Diagnosis not present

## 2017-08-22 DIAGNOSIS — Z0001 Encounter for general adult medical examination with abnormal findings: Secondary | ICD-10-CM | POA: Diagnosis not present

## 2017-08-22 DIAGNOSIS — I10 Essential (primary) hypertension: Secondary | ICD-10-CM | POA: Diagnosis not present

## 2017-08-22 DIAGNOSIS — K219 Gastro-esophageal reflux disease without esophagitis: Secondary | ICD-10-CM

## 2017-08-22 DIAGNOSIS — I2510 Atherosclerotic heart disease of native coronary artery without angina pectoris: Secondary | ICD-10-CM

## 2017-08-22 DIAGNOSIS — E559 Vitamin D deficiency, unspecified: Secondary | ICD-10-CM

## 2017-08-22 DIAGNOSIS — R7303 Prediabetes: Secondary | ICD-10-CM

## 2017-08-22 DIAGNOSIS — K429 Umbilical hernia without obstruction or gangrene: Secondary | ICD-10-CM

## 2017-08-22 DIAGNOSIS — E349 Endocrine disorder, unspecified: Secondary | ICD-10-CM | POA: Diagnosis not present

## 2017-08-22 LAB — COMPREHENSIVE METABOLIC PANEL
ALT: 17 U/L (ref 0–53)
AST: 20 U/L (ref 0–37)
Albumin: 4.3 g/dL (ref 3.5–5.2)
Alkaline Phosphatase: 47 U/L (ref 39–117)
BILIRUBIN TOTAL: 0.6 mg/dL (ref 0.2–1.2)
BUN: 19 mg/dL (ref 6–23)
CALCIUM: 9.7 mg/dL (ref 8.4–10.5)
CO2: 26 mEq/L (ref 19–32)
CREATININE: 1.11 mg/dL (ref 0.40–1.50)
Chloride: 106 mEq/L (ref 96–112)
GFR: 69.19 mL/min (ref >60.00)
GLUCOSE: 96 mg/dL (ref 70–99)
Potassium: 4.4 mEq/L (ref 3.5–5.1)
Sodium: 139 mEq/L (ref 135–145)
Total Protein: 7.4 g/dL (ref 6.0–8.3)

## 2017-08-22 LAB — TSH: TSH: 1.93 u[IU]/mL (ref 0.35–4.50)

## 2017-08-22 LAB — CBC WITH DIFFERENTIAL/PLATELET
BASOS ABS: 0.1 10*3/uL (ref 0.0–0.1)
Basophils Relative: 1.2 % (ref 0.0–3.0)
Eosinophils Absolute: 0.1 10*3/uL (ref 0.0–0.7)
Eosinophils Relative: 1.9 % (ref 0.0–5.0)
HCT: 52 % (ref 39.0–52.0)
Hemoglobin: 17.3 g/dL — ABNORMAL HIGH (ref 13.0–17.0)
LYMPHS PCT: 26 % (ref 12.0–46.0)
Lymphs Abs: 1.5 10*3/uL (ref 0.7–4.0)
MCHC: 33.3 g/dL (ref 30.0–36.0)
MCV: 89.2 fl (ref 78.0–100.0)
MONOS PCT: 12.6 % — AB (ref 3.0–12.0)
Monocytes Absolute: 0.7 10*3/uL (ref 0.1–1.0)
NEUTROS PCT: 58.3 % (ref 43.0–77.0)
Neutro Abs: 3.4 10*3/uL (ref 1.4–7.7)
Platelets: 167 10*3/uL (ref 150.0–400.0)
RBC: 5.83 Mil/uL — ABNORMAL HIGH (ref 4.22–5.81)
RDW: 16.7 % — ABNORMAL HIGH (ref 11.5–15.5)
WBC: 5.9 10*3/uL (ref 4.0–10.5)

## 2017-08-22 LAB — VITAMIN D 25 HYDROXY (VIT D DEFICIENCY, FRACTURES): VITD: 65.02 ng/mL (ref 30.00–100.00)

## 2017-08-22 LAB — CK: CK TOTAL: 141 U/L (ref 7–232)

## 2017-08-22 LAB — HEMOGLOBIN A1C: Hgb A1c MFr Bld: 6.2 % (ref 4.6–6.5)

## 2017-08-22 LAB — LIPID PANEL
CHOL/HDL RATIO: 4
Cholesterol: 128 mg/dL (ref 0–200)
HDL: 29.1 mg/dL — ABNORMAL LOW (ref >39.00)
LDL Cholesterol: 71 mg/dL (ref 0–99)
NonHDL: 99.09
Triglycerides: 140 mg/dL (ref 0.0–149.0)
VLDL: 28 mg/dL (ref 0.0–40.0)

## 2017-08-22 MED ORDER — TAMSULOSIN HCL 0.4 MG PO CAPS: 0.4000 mg | ORAL_CAPSULE | Freq: Every day | ORAL | 3 refills | Status: DC

## 2017-08-22 MED ORDER — ROSUVASTATIN CALCIUM 20 MG PO TABS: ORAL_TABLET | ORAL | 3 refills | Status: DC

## 2017-08-22 MED ORDER — VALSARTAN 40 MG PO TABS: 40.0000 mg | ORAL_TABLET | Freq: Every day | ORAL | 3 refills | Status: DC

## 2017-08-22 MED ORDER — OMEPRAZOLE 20 MG PO CPDR: 20.0000 mg | DELAYED_RELEASE_CAPSULE | Freq: Every day | ORAL | 3 refills | Status: DC

## 2017-08-22 NOTE — Assessment & Plan Note (Signed)
BP well controlled Current regimen effective and well tolerated Continue current medications at current doses cmp  

## 2017-08-22 NOTE — Assessment & Plan Note (Signed)
Check a1c Low sugar / carb diet Stressed regular exercise   

## 2017-08-22 NOTE — Assessment & Plan Note (Signed)
Managed by endo

## 2017-08-22 NOTE — Assessment & Plan Note (Signed)
Taking vitamin d Check level

## 2017-08-22 NOTE — Assessment & Plan Note (Signed)
S/p CABG x 2 in 2002 Asymptomatic Not seeing cardio -but will make an appt to discuss testosterone replacement and evaluate CAD Continue current medications

## 2017-08-22 NOTE — Assessment & Plan Note (Signed)
Very small, non tender, reducible Asymptomatic monitor

## 2017-08-22 NOTE — Assessment & Plan Note (Signed)
Check lipid panel  Continue daily statin Regular exercise and healthy diet encouraged  

## 2017-08-22 NOTE — Assessment & Plan Note (Signed)
GERD controlled Continue daily medication  

## 2017-08-23 ENCOUNTER — Encounter: Payer: Self-pay | Admitting: Internal Medicine

## 2017-10-06 DIAGNOSIS — H6121 Impacted cerumen, right ear: Secondary | ICD-10-CM | POA: Diagnosis not present

## 2017-10-06 DIAGNOSIS — H902 Conductive hearing loss, unspecified: Secondary | ICD-10-CM | POA: Diagnosis not present

## 2017-10-06 DIAGNOSIS — H9311 Tinnitus, right ear: Secondary | ICD-10-CM | POA: Diagnosis not present

## 2017-10-06 DIAGNOSIS — H903 Sensorineural hearing loss, bilateral: Secondary | ICD-10-CM | POA: Diagnosis not present

## 2017-11-18 ENCOUNTER — Encounter: Payer: Self-pay | Admitting: Internal Medicine

## 2017-11-18 NOTE — Telephone Encounter (Signed)
Documented flu shot...Patrick Harris

## 2017-11-20 ENCOUNTER — Telehealth: Payer: Self-pay | Admitting: Internal Medicine

## 2017-11-20 DIAGNOSIS — I2510 Atherosclerotic heart disease of native coronary artery without angina pectoris: Secondary | ICD-10-CM

## 2017-11-20 NOTE — Telephone Encounter (Signed)
Copied from [place] [id number]. Topic: Referral - Request for Referral >> Nov 20, 2017 11:46 AM Lennox Solders wrote: Has patient seen PCP for this complaint? Yes in aug 2019. Pt is on testosterone gel and needs CAD eval. Pt had a coronary artery bypass graft in 2001. Pt would like to see dr Rozann Lesches phone [phone]

## 2017-11-20 NOTE — Telephone Encounter (Signed)
Please advise 

## 2017-11-20 NOTE — Telephone Encounter (Signed)
Referral ordered

## 2017-12-16 DIAGNOSIS — D352 Benign neoplasm of pituitary gland: Secondary | ICD-10-CM | POA: Diagnosis not present

## 2017-12-23 DIAGNOSIS — I25810 Atherosclerosis of coronary artery bypass graft(s) without angina pectoris: Secondary | ICD-10-CM | POA: Diagnosis not present

## 2017-12-23 DIAGNOSIS — Z87898 Personal history of other specified conditions: Secondary | ICD-10-CM | POA: Diagnosis not present

## 2017-12-23 DIAGNOSIS — D352 Benign neoplasm of pituitary gland: Secondary | ICD-10-CM | POA: Diagnosis not present

## 2017-12-23 DIAGNOSIS — E785 Hyperlipidemia, unspecified: Secondary | ICD-10-CM | POA: Diagnosis not present

## 2017-12-23 LAB — LIPID PANEL
Cholesterol: 137 (ref 0–200)
HDL: 36 (ref 35–70)
LDL Cholesterol: 81
Triglycerides: 101 (ref 40–160)

## 2017-12-23 LAB — HEMOGLOBIN A1C: Hemoglobin A1C: 5.9

## 2018-01-01 ENCOUNTER — Ambulatory Visit: Payer: Medicare Other | Admitting: Cardiology

## 2018-01-01 ENCOUNTER — Encounter: Payer: Self-pay | Admitting: Cardiology

## 2018-01-01 VITALS — BP 140/72 | HR 60 | Ht 72.0 in | Wt 185.0 lb

## 2018-01-01 DIAGNOSIS — I25119 Atherosclerotic heart disease of native coronary artery with unspecified angina pectoris: Secondary | ICD-10-CM

## 2018-01-01 DIAGNOSIS — R011 Cardiac murmur, unspecified: Secondary | ICD-10-CM

## 2018-01-01 DIAGNOSIS — I10 Essential (primary) hypertension: Secondary | ICD-10-CM | POA: Diagnosis not present

## 2018-01-01 DIAGNOSIS — E782 Mixed hyperlipidemia: Secondary | ICD-10-CM

## 2018-01-01 NOTE — Progress Notes (Signed)
.    Cardiology Office Note  Date: 01/01/2018   ID: Patrick Harris, DOB 1945/06/24, MRN [id number]  PCP: [name], MD  Consulting Cardiologist: Rozann Lesches, MD   Chief Complaint  Patient presents with  . Coronary Artery Disease    History of Present Illness: Patrick Harris is a 72 y.o. male referred for cardiology consultation by Dr. Quay Burow with known CAD and previous CABG in 2000.  He follows with endocrinology for management of low testosterone and hypopituitarism.  He had been on long-term testosterone gel, it was recommended to him that he stop it about 3 months ago.  He has had fatigue and impotence since that time.  In talking with him, it sounds like one of the main reasons that he was referred here today is to reevaluate his cardiac status to see if he would still be a candidate to continue testosterone supplementation.  He does not report any angina symptoms at baseline, generally has NYHA class II dyspnea.  He reports no palpitations or syncope.  He states that he has been compliant with his medications which include aspirin, Diovan, and Crestor.  I personally reviewed her ECG today which shows sinus bradycardia with nonspecific ST changes.  I also reviewed his old cardiac records.  He has not undergone any recent ischemic or structural cardiac testing.  Past Medical History:  Diagnosis Date  . Arthritis   . CAD (coronary artery disease)    CABG with LIMA to LAD and RIMA to RCA August 2000 - Dr. Roxan Hockey  . COPD (chronic obstructive pulmonary disease) ([place])   . Elevated glycated hemoglobin   . Essential hypertension   . GERD (gastroesophageal reflux disease)   . Heart murmur   . Hyperlipidemia   . Hypopituitarism ([place])   . Osteopenia    Dr Wilson Singer  . Pancreatitis   . Peyronie disease   . Testosterone deficiency    Dr Wilson Singer    Past Surgical History:  Procedure Laterality Date  . Bone spur  2006   5th toe bilaterally  . COLONOSCOPY  2007   negative; Dr Sharlett Iles  . CORONARY ARTERY BYPASS GRAFT  2001   2 vessels  . fracture heel  1967   Bil/ due to fall off ladder  . Fractured calcaneus  1969   bilaterally w/ ankle fusion  . HERNIA REPAIR  1984   right inguinal hernia  . MENISECTOMY  2007   R medial, Dr. Lorin Mercy  . [place]   left  . UPPER GASTROINTESTINAL ENDOSCOPY  2007   GERD, Dr Sharlett Iles  . VASECTOMY      Current Outpatient Medications  Medication Sig Dispense Refill  . aspirin EC 81 MG tablet Take 1 tablet (81 mg total) by mouth daily. (Patient taking differently: Take 81 mg at bedtime by mouth. )    . bromocriptine (PARLODEL) 2.5 MG tablet Take 7.5 mg by mouth daily.     . Calcium-Magnesium-Zinc (CAL-MAG-ZINC PO) Take by mouth daily.      . Cholecalciferol (VITAMIN D3) 2000 UNITS TABS Take by mouth. 2000 iu in the am, 1000 iu in the pm    . Coenzyme Q10 (COQ10) 100 MG CAPS Take by mouth daily.      Marland Kitchen CRANBERRY PO Take by mouth. 1 tablespoon daily     . Flaxseed, Linseed, (FLAX SEED OIL PO) Take by mouth.      . Garlic 2376 MG CAPS Take 1,000 mg by mouth 2 (two) times daily.    Marland Kitchen  Iodine, Kelp, (KELP PO) Take by mouth daily.    Marland Kitchen L-Arginine 500 MG CAPS Take by mouth daily.      Marland Kitchen neomycin-polymyxin-hydrocortisone (CORTISPORIN) 3.5-10000-1 OTIC suspension     . omeprazole (PRILOSEC) 20 MG capsule Take 1 capsule (20 mg total) by mouth daily. 90 capsule 3  . Pomegranate Seed 80 % OIL by Does not apply route. 1 table spoon a day     . Potassium 99 MG TABS Take by mouth daily.      . Probiotic Product (PROBIOTIC DAILY PO) Take by mouth daily.    . rosuvastatin (CRESTOR) 20 MG tablet TAKE HALF A TABLET (10 MG) BY MOUTH ONCE DAILY. 45 tablet 3  . sildenafil (REVATIO) 20 MG tablet Take 20 mg by mouth daily.    . tamsulosin (FLOMAX) 0.4 MG CAPS capsule Take 1 capsule (0.4 mg total) by mouth daily. 90 capsule 3  . Turmeric 500 MG CAPS Take by mouth.      . valsartan (DIOVAN) 40 MG tablet Take 1 tablet (40  mg total) by mouth daily. 90 tablet 3  . vitamin C (ASCORBIC ACID) 500 MG tablet Take 500 mg by mouth 2 (two) times daily.     No current facility-administered medications for this visit.    Allergies:  Latex; Amlodipine; Hydrochlorothiazide; and Simvastatin   Social History: The patient  reports that he quit smoking about 19 years ago. He has never used smokeless tobacco. He reports that he does not drink alcohol or use drugs.   Family History: The patient's family history includes Aortic aneurysm in his brother; Atrial fibrillation in his brother and sister; Cancer in his maternal uncle; Colon cancer in his paternal uncle; Diabetes in his mother; Heart attack (age of onset: 59) in his father; Heart disease in his brother; Hypertension in his mother; Leukemia in his sister; Parkinson's disease in his sister; Stroke in his sister, sister, and sister; Stroke (age of onset: 33) in his mother.   ROS:  Please see the history of present illness. Otherwise, complete review of systems is positive for erectile dysfunction.  All other systems are reviewed and negative.   Physical Exam: VS:  BP 140/72 (BP Location: Right Arm)   Pulse 60   Ht 6' (1.829 m)   Wt 185 lb (83.9 kg)   SpO2 96%   BMI 25.09 kg/m , BMI Body mass index is 25.09 kg/m.  Wt Readings from Last 3 Encounters:  01/01/18 185 lb (83.9 kg)  08/22/17 181 lb (82.1 kg)  11/19/16 185 lb 3.2 oz (84 kg)    General: Patient appears comfortable at rest. HEENT: Conjunctiva and lids normal, oropharynx clear. Neck: Supple, no elevated JVP or carotid bruits, no thyromegaly. Lungs: Clear to auscultation, nonlabored breathing at rest. Cardiac: Regular rate and rhythm, no S3, 2/6 systolic murmur, no pericardial rub. Abdomen: Soft, nontender, bowel sounds present, no guarding or rebound. Extremities: No pitting edema, distal pulses 2+. Skin: Warm and dry. Musculoskeletal: No kyphosis. Neuropsychiatric: Alert and oriented x3, affect grossly  appropriate.  ECG: I personally reviewed the tracing from 01/18/2015 which showed sinus bradycardia with left atrial enlargement and nonspecific ST changes.  Recent Labwork: 08/22/2017: ALT 17; AST 20; BUN 19; Creatinine, Ser 1.11; Hemoglobin 17.3; Platelets 167.0; Potassium 4.4; Sodium 139; TSH 1.93     Component Value Date/Time   CHOL 128 08/22/2017 0847   TRIG 140.0 08/22/2017 0847   HDL 29.10 (L) 08/22/2017 0847   CHOLHDL 4 08/22/2017 0847   VLDL 28.0  08/22/2017 0847   Lucien 71 08/22/2017 0847    Other Studies Reviewed Today:  Abdominal ultrasound 11/22/2016: FINDINGS: Gallbladder: No gallstones or wall thickening visualized. No sonographic Murphy sign noted by sonographer.  Common bile duct: Diameter: 3 mm. Where visualized, no filling defect.  Liver: No focal lesion identified. Within normal limits in parenchymal echogenicity. Portal vein is patent on color Doppler imaging with normal direction of blood flow towards the liver.  IVC: No abnormality visualized.  Pancreas: Pancreatitis seen by CT is not visualized sonographically. No evidence of collection. No duct dilatation.  Spleen: Size and appearance within normal limits.  Right Kidney: Length: 12 cm. Interpolar scarring. No hydronephrosis or solid mass.  Left Kidney: Length: 12.5 cm. Echogenicity within normal limits. No mass or hydronephrosis visualized.  Abdominal aorta: Atheromatous wall thickening of the aorta. No aneurysm.  IMPRESSION: No evidence of biliary stone. No new finding compared to CT 11/15/2016.  Assessment and Plan:  1.  CAD status post CABG in 2000 including LIMA to the LAD and RIMA to the RCA.  He is on aspirin and statin therapy, does not report any obvious angina symptoms at this point.  ECG is also nonspecific.  He has not undergone any ischemic or structural cardiac testing in several years.  Plan is to proceed with a [place] for ischemic surveillance.  We will also  obtain an echocardiogram.  2.  Low testosterone and history of hypopituitarism.  He follows with endocrinology.  As noted above plan is to reassess cardiac status from the perspective of ischemic burden and LVEF.  He does not report any progressive angina symptoms, and unless there are high risk features on his cardiac testing, there would not be a specific direct contraindication for him to use testosterone supplementation as long as he realizes the risk/benefit ratio.  There is an increased risk of adverse cardiac events with testosterone supplementation.  3.  Mixed hyperlipidemia, on Crestor.  Recent LDL 71.  4.  Essential hypertension, on Diovan.  Current medicines were reviewed with the patient today.   Orders Placed This Encounter  Procedures  . NM Myocar Multi W/Spect W/Wall Motion / EF  . EKG 12-Lead  . ECHOCARDIOGRAM COMPLETE    Disposition: Call with test results.  Tentative follow-up in 1 year.  Signed, Satira Sark, MD, [facility] 01/01/2018 2:56 PM    [place] [facility] [address] [place], [place] [address] Phone: [phone]; Fax: [phone]

## 2018-01-01 NOTE — Patient Instructions (Signed)
Medication Instructions:  Your physician recommends that you continue on your current medications as directed. Please refer to the Current Medication list given to you today.  If you need a refill on your cardiac medications before your next appointment, please call your pharmacy.   Lab work: None today If you have labs (blood work) drawn today and your tests are completely normal, you will receive your results only by: Marland Kitchen MyChart Message (if you have MyChart) OR . A paper copy in the mail If you have any lab test that is abnormal or we need to change your treatment, we will call you to review the results.  Testing/Procedures: Your physician has requested that you have an echocardiogram. Echocardiography is a painless test that uses sound waves to create images of your heart. It provides your doctor with information about the size and shape of your heart and how well your heart's chambers and valves are working. This procedure takes approximately one hour. There are no restrictions for this procedure.  Your physician has requested that you have a lexiscan myoview. For further information please visit [email or web address]. Please follow instruction sheet, as given.    Follow-Up: At [facility], you and your health needs are our priority.  As part of our continuing mission to provide you with exceptional heart care, we have created designated Provider Care Teams.  These Care Teams include your primary Cardiologist (physician) and Advanced Practice Providers (APPs -  Physician Assistants and Nurse Practitioners) who all work together to provide you with the care you need, when you need it. You will need a follow up appointment in 1 years.  Please call our office 2 months in advance to schedule this appointment.  You may see Rozann Lesches, MD or one of the following Advanced Practice Providers on your designated Care Team:   Bernerd Pho, PA-C [name]) . Ermalinda Barrios, PA-C  ([place])  Any Other Special Instructions Will Be Listed Below (If Applicable). None

## 2018-01-15 ENCOUNTER — Ambulatory Visit (HOSPITAL_COMMUNITY)
Admission: RE | Admit: 2018-01-15 | Discharge: 2018-01-15 | Disposition: A | Discharge status: Home / Self Care | Payer: Medicare Other | Source: Ambulatory Visit | Attending: Cardiology | Admitting: Cardiology

## 2018-01-15 ENCOUNTER — Encounter (HOSPITAL_COMMUNITY): Payer: Self-pay

## 2018-01-15 ENCOUNTER — Encounter (HOSPITAL_COMMUNITY): Payer: Medicare Other

## 2018-01-15 ENCOUNTER — Ambulatory Visit (HOSPITAL_BASED_OUTPATIENT_CLINIC_OR_DEPARTMENT_OTHER)
Admission: RE | Admit: 2018-01-15 | Discharge: 2018-01-15 | Disposition: A | Discharge status: Home / Self Care | Payer: Medicare Other | Source: Ambulatory Visit | Attending: Cardiology | Admitting: Cardiology

## 2018-01-15 DIAGNOSIS — R011 Cardiac murmur, unspecified: Secondary | ICD-10-CM | POA: Diagnosis not present

## 2018-01-15 DIAGNOSIS — I25119 Atherosclerotic heart disease of native coronary artery with unspecified angina pectoris: Secondary | ICD-10-CM | POA: Diagnosis not present

## 2018-01-15 LAB — NM MYOCAR MULTI W/SPECT W/WALL MOTION / EF
CHL CUP NUCLEAR SRS: 7
LV dias vol: 126 mL (ref 62–150)
LV sys vol: 66 mL
Peak HR: 68 {beats}/min
RATE: 0.36
Rest HR: 41 {beats}/min
SDS: 3
SSS: 10
TID: 1.19

## 2018-01-15 MED ORDER — SODIUM CHLORIDE 0.9% FLUSH
INTRAVENOUS | Status: AC
  Administered 2018-01-15: 10 mL via INTRAVENOUS
  Filled 2018-01-15: qty 10

## 2018-01-15 MED ORDER — TECHNETIUM TC 99M TETROFOSMIN IV KIT
30.0000 | PACK | Freq: Once | INTRAVENOUS | Status: AC | PRN
  Administered 2018-01-15: 29 via INTRAVENOUS

## 2018-01-15 MED ORDER — TECHNETIUM TC 99M TETROFOSMIN IV KIT
10.0000 | PACK | Freq: Once | INTRAVENOUS | Status: AC | PRN
  Administered 2018-01-15: 11 via INTRAVENOUS

## 2018-01-15 MED ORDER — REGADENOSON 0.4 MG/5ML IV SOLN
INTRAVENOUS | Status: AC
  Administered 2018-01-15: 0.4 mg via INTRAVENOUS
  Filled 2018-01-15: qty 5

## 2018-01-15 NOTE — Progress Notes (Signed)
*  PRELIMINARY RESULTS* Echocardiogram 2D Echocardiogram has been performed.  Patrick Harris 01/15/2018, 10:12 AM

## 2018-01-16 ENCOUNTER — Telehealth: Payer: Self-pay

## 2018-01-16 NOTE — Telephone Encounter (Signed)
-----   Message from Satira Sark, MD sent at 01/16/2018  8:52 AM EST ----- Regarding: RE: can he use testosterone gel Please see my recent office note.  My recommendations regarding hormone replacement are outlined in the bottom of the note presuming his stress testing was low risk which it was.  That note and his stress test results can be forwarded to his PCP/endocrinologist and they can all decide how to handle the testosterone. ----- Message ----- From: Bernita Raisin, RN Sent: 01/16/2018   8:48 AM EST To: Satira Sark, MD Subject: can he use testosterone gel                    Pt wants to know if he can continue to use Testerone gel. Says the endocrinologist wanted him off it

## 2018-01-16 NOTE — Telephone Encounter (Signed)
I faxed Dr.McDowell's note to endocrinologist,Dr.Averenei  [phone]   Pr will need to discuss with them per Dr.McDowell   Pt aware and will discuss with pcp and endocrinologist

## 2018-01-25 ENCOUNTER — Encounter (HOSPITAL_COMMUNITY): Payer: Self-pay | Admitting: Emergency Medicine

## 2018-01-25 ENCOUNTER — Other Ambulatory Visit: Payer: Self-pay

## 2018-01-25 ENCOUNTER — Emergency Department (HOSPITAL_COMMUNITY): Payer: Medicare Other

## 2018-01-25 ENCOUNTER — Emergency Department (HOSPITAL_COMMUNITY)
Admission: EM | Admit: 2018-01-25 | Discharge: 2018-01-25 | Disposition: A | Discharge status: Home / Self Care | Payer: Medicare Other | Attending: Emergency Medicine | Admitting: Emergency Medicine

## 2018-01-25 DIAGNOSIS — J449 Chronic obstructive pulmonary disease, unspecified: Secondary | ICD-10-CM | POA: Diagnosis not present

## 2018-01-25 DIAGNOSIS — I10 Essential (primary) hypertension: Secondary | ICD-10-CM | POA: Diagnosis not present

## 2018-01-25 DIAGNOSIS — S161XXA Strain of muscle, fascia and tendon at neck level, initial encounter: Secondary | ICD-10-CM

## 2018-01-25 DIAGNOSIS — M542 Cervicalgia: Secondary | ICD-10-CM | POA: Insufficient documentation

## 2018-01-25 DIAGNOSIS — I2510 Atherosclerotic heart disease of native coronary artery without angina pectoris: Secondary | ICD-10-CM | POA: Insufficient documentation

## 2018-01-25 DIAGNOSIS — Z87891 Personal history of nicotine dependence: Secondary | ICD-10-CM | POA: Insufficient documentation

## 2018-01-25 DIAGNOSIS — R05 Cough: Secondary | ICD-10-CM | POA: Diagnosis not present

## 2018-01-25 DIAGNOSIS — J209 Acute bronchitis, unspecified: Secondary | ICD-10-CM | POA: Diagnosis not present

## 2018-01-25 DIAGNOSIS — Z79899 Other long term (current) drug therapy: Secondary | ICD-10-CM | POA: Diagnosis not present

## 2018-01-25 MED ORDER — IBUPROFEN 400 MG PO TABS: 400.0000 mg | ORAL_TABLET | Freq: Four times a day (QID) | ORAL | 0 refills | Status: DC | PRN

## 2018-01-25 MED ORDER — DOXYCYCLINE HYCLATE 100 MG PO CAPS: 100.0000 mg | ORAL_CAPSULE | Freq: Two times a day (BID) | ORAL | 0 refills | Status: DC

## 2018-01-25 MED ORDER — BENZONATATE 100 MG PO CAPS: 100.0000 mg | ORAL_CAPSULE | Freq: Three times a day (TID) | ORAL | 0 refills | Status: DC

## 2018-01-25 MED ORDER — METHOCARBAMOL 500 MG PO TABS: 500.0000 mg | ORAL_TABLET | Freq: Two times a day (BID) | ORAL | 0 refills | Status: DC | PRN

## 2018-01-25 MED ORDER — KETOROLAC TROMETHAMINE 60 MG/2ML IM SOLN
60.0000 mg | Freq: Once | INTRAMUSCULAR | Status: AC
  Administered 2018-01-25: 60 mg via INTRAMUSCULAR
  Filled 2018-01-25: qty 2

## 2018-01-25 MED ORDER — METHOCARBAMOL 500 MG PO TABS
750.0000 mg | ORAL_TABLET | Freq: Once | ORAL | Status: AC
  Administered 2018-01-25: 750 mg via ORAL
  Filled 2018-01-25: qty 2

## 2018-01-25 NOTE — ED Triage Notes (Addendum)
Pt reports cough and neck pain for last several days. Airway patent. Pt denies any known injury, headache, light/sound sensitivity. Pt reports productive cough and reports intermittent fever.

## 2018-01-25 NOTE — ED Provider Notes (Signed)
[facility] EMERGENCY DEPARTMENT Provider Note   CSN: [id number] Arrival date & time: 01/25/18  1338     History   Chief Complaint Chief Complaint  Patient presents with  . Cough    HPI Patrick Harris is a 73 y.o. male.  HPI  73 year old male, he has a known history of coronary disease, COPD as well as a history of some intermittent neck pain which has come and gone over time.  He presents with 2 main complaints one is his coughing which started about 5 days ago, has been persistent, fluctuates throughout the day and is productive of phlegm.  He had had some subjective fevers but no measured temperature over 101, no swelling of the legs, no myalgia, no headache, no sore throat.  He has not had any evaluation for this complaint at this time.  He is taking no cough medications.  He also complains of a neck pain which has been present for the last several days and is consistent with prior episodes of neck pain for which she had seen a chiropractor in the past.  It seems to be the bilateral posterior neck muscles extending up onto the back of the scalp and going down into her shoulders.  It makes his neck very stiff and he is unable to move from side to side front or back.  There is no numbness or weakness in the arms.  No rashes to the skin.  He has been taking Advil without relief  Past Medical History:  Diagnosis Date  . Arthritis   . CAD (coronary artery disease)    CABG with LIMA to LAD and RIMA to RCA August 2000 - Dr. Roxan Hockey  . COPD (chronic obstructive pulmonary disease) ([place])   . Elevated glycated hemoglobin   . Essential hypertension   . GERD (gastroesophageal reflux disease)   . Heart murmur   . Hyperlipidemia   . Hypopituitarism ([place])   . Osteopenia    Dr Wilson Singer  . Pancreatitis   . Peyronie disease   . Testosterone deficiency    Dr Wilson Singer    Patient Active Problem List   Diagnosis Date Noted  . Prediabetes 08/22/2017  . Umbilical hernia 43/32/9518  .  Varicose veins of both lower extremities 05/22/2016  . Vitamin D deficiency 08/21/2015  . Hypertension 01/24/2015  . Family history of prostate cancer 08/19/2014  . Osteopenia 08/07/2009  . Hyperlipidemia 08/12/2007  . PITUITARY INSUFFICIENCY 08/05/2007  . Testosterone deficiency 08/05/2007  . Coronary atherosclerosis 08/05/2007  . GERD 08/05/2007  . BPH without obstruction/lower urinary tract symptoms 08/05/2007    Past Surgical History:  Procedure Laterality Date  . Bone spur  2006   5th toe bilaterally  . COLONOSCOPY  2007   negative; Dr Sharlett Iles  . CORONARY ARTERY BYPASS GRAFT  2001   2 vessels  . fracture heel  1967   Bil/ due to fall off ladder  . Fractured calcaneus  1969   bilaterally w/ ankle fusion  . HERNIA REPAIR  1984   right inguinal hernia  . MENISECTOMY  2007   R medial, Dr. Lorin Mercy  . [place]   left  . UPPER GASTROINTESTINAL ENDOSCOPY  2007   GERD, Dr Sharlett Iles  . VASECTOMY          Home Medications    Prior to Admission medications   Medication Sig Start Date End Date Taking? Authorizing Provider  aspirin EC 81 MG tablet Take 1 tablet (81 mg total) by mouth  daily. Patient taking differently: Take 81 mg at bedtime by mouth.  01/24/15   [name], MD  benzonatate (TESSALON) 100 MG capsule Take 1 capsule (100 mg total) by mouth every 8 (eight) hours. 01/25/18   Noemi Chapel, MD  bromocriptine (PARLODEL) 2.5 MG tablet Take 7.5 mg by mouth daily.     [provider]  Calcium-Magnesium-Zinc (CAL-MAG-ZINC PO) Take by mouth daily.      [provider]  Cholecalciferol (VITAMIN D3) 2000 UNITS TABS Take by mouth. 2000 iu in the am, 1000 iu in the pm    [provider]  Coenzyme Q10 (COQ10) 100 MG CAPS Take by mouth daily.      [provider]  CRANBERRY PO Take by mouth. 1 tablespoon daily     [provider]  doxycycline (VIBRAMYCIN) 100 MG capsule Take 1 capsule (100 mg total) by mouth 2 (two)  times daily. 01/25/18   Noemi Chapel, MD  Flaxseed, Linseed, (FLAX SEED OIL PO) Take by mouth.      [provider]  Garlic 3810 MG CAPS Take 1,000 mg by mouth 2 (two) times daily.    [provider]  ibuprofen (ADVIL,MOTRIN) 400 MG tablet Take 1 tablet (400 mg total) by mouth every 6 (six) hours as needed. 01/25/18   Noemi Chapel, MD  Iodine, Kelp, (KELP PO) Take by mouth daily.    [provider]  L-Arginine 500 MG CAPS Take by mouth daily.      [provider]  methocarbamol (ROBAXIN) 500 MG tablet Take 1 tablet (500 mg total) by mouth 2 (two) times daily as needed for muscle spasms. 01/25/18   Noemi Chapel, MD  neomycin-polymyxin-hydrocortisone (CORTISPORIN) 3.5-10000-1 OTIC suspension  08/19/17   [provider]  omeprazole (PRILOSEC) 20 MG capsule Take 1 capsule (20 mg total) by mouth daily. 08/22/17   [name], MD  Pomegranate Seed 80 % OIL by Does not apply route. 1 table spoon a day     [provider]  Potassium 99 MG TABS Take by mouth daily.      [provider]  Probiotic Product (PROBIOTIC DAILY PO) Take by mouth daily.    [provider]  rosuvastatin (CRESTOR) 20 MG tablet TAKE HALF A TABLET (10 MG) BY MOUTH ONCE DAILY. 08/22/17   [name], MD  sildenafil (REVATIO) 20 MG tablet Take 20 mg by mouth daily.    [provider]  tamsulosin (FLOMAX) 0.4 MG CAPS capsule Take 1 capsule (0.4 mg total) by mouth daily. 08/22/17   [name], MD  Turmeric 500 MG CAPS Take by mouth.      [provider]  valsartan (DIOVAN) 40 MG tablet Take 1 tablet (40 mg total) by mouth daily. 08/22/17   [name], MD  vitamin C (ASCORBIC ACID) 500 MG tablet Take 500 mg by mouth 2 (two) times daily.    [provider]    Family History Family History  Problem Relation Age of Onset  . Heart attack Father 7  . Diabetes Mother   . Stroke Mother 33  . Hypertension Mother   . Leukemia Sister    . Colon cancer Paternal Uncle   . Aortic aneurysm Brother        abdominal  . Heart disease Brother   . Atrial fibrillation Brother   . Cancer Maternal Uncle         X3:prostate , renal, bone   . Parkinson's disease Sister   .  Stroke Sister   . Stroke Sister   . Stroke Sister   . Atrial fibrillation Sister     Social History Social History   Tobacco Use  . Smoking status: Former Smoker    Last attempt to quit: 01/07/1998    Years since quitting: 20.0  . Smokeless tobacco: Never Used  . Tobacco comment: smoked 1963- 2000, up to 1 ppd  Substance Use Topics  . Alcohol use: No  . Drug use: No     Allergies   Latex; Amlodipine; Hydrochlorothiazide; and Simvastatin   Review of Systems Review of Systems  All other systems reviewed and are negative.    Physical Exam Updated Vital Signs BP (!) 155/76 (BP Location: Right Arm)   Pulse 74   Temp 97.7 F (36.5 C) (Oral)   Resp 17   Ht 1.829 m (6')   Wt 83.9 kg   SpO2 99%   BMI 25.09 kg/m   Physical Exam Vitals signs and nursing note reviewed.  Constitutional:      General: He is not in acute distress.    Appearance: He is well-developed.  HENT:     Head: Normocephalic and atraumatic.     Mouth/Throat:     Pharynx: No oropharyngeal exudate.  Eyes:     General: No scleral icterus.       Right eye: No discharge.        Left eye: No discharge.     Conjunctiva/sclera: Conjunctivae normal.     Pupils: Pupils are equal, round, and reactive to light.  Neck:     Musculoskeletal: Normal range of motion and neck supple.     Thyroid: No thyromegaly.     Vascular: No JVD.  Cardiovascular:     Rate and Rhythm: Normal rate and regular rhythm.     Heart sounds: Normal heart sounds. No murmur. No friction rub. No gallop.   Pulmonary:     Effort: Pulmonary effort is normal. No respiratory distress.     Breath sounds: Wheezing present. No rales.  Abdominal:     General: Bowel sounds are normal. There is no distension.      Palpations: Abdomen is soft. There is no mass.     Tenderness: There is no abdominal tenderness.  Musculoskeletal: Normal range of motion.        General: Tenderness present.  Lymphadenopathy:     Cervical: No cervical adenopathy.  Skin:    General: Skin is warm and dry.     Findings: No erythema or rash.  Neurological:     Mental Status: He is alert.     Coordination: Coordination normal.  Psychiatric:        Behavior: Behavior normal.      ED Treatments / Results  Labs (all labs ordered are listed, but only abnormal results are displayed) Labs Reviewed - No data to display  EKG None  Radiology Dg Chest 2 View  Result Date: 01/25/2018 CLINICAL DATA:  Cough and pain for several days EXAM: CHEST - 2 VIEW COMPARISON:  01/18/2015 FINDINGS: Cardiac shadow is stable. Postsurgical changes are noted. Mild interstitial changes are noted bilaterally as well as hyperinflation. No focal confluent infiltrate is seen. No bony abnormality is noted. IMPRESSION: COPD. Diffuse interstitial changes which may represent some acute bronchitis. Electronically Signed   By: Inez Catalina M.D.   On: 01/25/2018 15:55    Procedures Procedures (including critical care time)  Medications Ordered in ED Medications  ketorolac (TORADOL) injection 60 mg (60 mg  Intramuscular Given 01/25/18 1432)  methocarbamol (ROBAXIN) tablet 750 mg (750 mg Oral Given 01/25/18 1432)     Initial Impression / Assessment and Plan / ED Course  I have reviewed the triage vital signs and the nursing notes.  Pertinent labs & imaging results that were available during my care of the patient were reviewed by me and considered in my medical decision making (see chart for details).  Clinical Course as of Jan 25 1605  Sun Jan 25, 2018  1603 The x-ray shows some signs of acute bronchitis, there is no focal infiltrates.  As he does not tolerate prednisone he will be treated with an antitussive as well as Toradol and Robaxin.  With  his history of COPD and increased phlegm production we will also prescribe doxycycline.   [BM]    Clinical Course User Index [BM] Noemi Chapel, MD   The lungs are clear except for some scattered wheezes.  The neck is tender to palpation over the muscles, this does not extend into the trapezius and he has full range of motion of both arms with normal neurologic sensation of both arms, normal strength of both arms and normal gait.  Mentation is normal.  I suspect that he has some muscular type pain.  This does not look like it is more of a meningismus.  He has no neurologic symptoms, no headache, no fever.  Chest x-ray to rule out pneumonia, anti-inflammatories and Robaxin for his neck pain.  He cannot take steroids secondary to significant insomnia.     Final Clinical Impressions(s) / ED Diagnoses   Final diagnoses:  Acute bronchitis, unspecified organism  Acute cervical myofascial strain, initial encounter    ED Discharge Orders         Ordered    doxycycline (VIBRAMYCIN) 100 MG capsule  2 times daily     01/25/18 1607    ibuprofen (ADVIL,MOTRIN) 400 MG tablet  Every 6 hours PRN     01/25/18 1607    benzonatate (TESSALON) 100 MG capsule  Every 8 hours     01/25/18 1607    methocarbamol (ROBAXIN) 500 MG tablet  2 times daily PRN     01/25/18 1607           Noemi Chapel, MD 01/25/18 1608

## 2018-01-25 NOTE — Discharge Instructions (Signed)
Your testing here shows no signs of pneumonia however with your underlying cough it is reasonable to try doxycycline twice a day for the next 10 days to treat for possible bacterial infection.  I would also recommend taking the ibuprofen as prescribed and the Robaxin as a muscle relaxer.  Tessalon may also be used to help with the cough.  Please see your doctor in 2 to 3 days for recheck  Thank you for letting us take care of you today!  Please obtain all of your results from medical records or have your doctors office obtain the results - share them with your doctor - you should be seen at your doctors office in the next 2 days. Call today to arrange your follow up. Take the medications as prescribed. Please review all of the medicines and only take them if you do not have an allergy to them. Please be aware that if you are taking birth control pills, taking other prescriptions, ESPECIALLY ANTIBIOTICS may make the birth control ineffective - if this is the case, either do not engage in sexual activity or use alternative methods of birth control such as condoms until you have finished the medicine and your family doctor says it is OK to restart them. If you are on a blood thinner such as COUMADIN, be aware that any other medicine that you take may cause the coumadin to either work too much, or not enough - you should have your coumadin level rechecked in next 7 days if this is the case.  ?  It is also a possibility that you have an allergic reaction to any of the medicines that you have been prescribed - Everybody reacts differently to medications and while MOST people have no trouble with most medicines, you may have a reaction such as nausea, vomiting, rash, swelling, shortness of breath. If this is the case, please stop taking the medicine immediately and contact your physician.   If you were given a medication in the ED such as percocet, vicodin, or morphine, be aware that these medicines are sedating  and may change your ability to take care of yourself adequately for several hours after being given this medicines - you should not drive or take care of small children if you were given this medicine in the Emergency Department or if you have been prescribed these types of medicines. ?   You should return to the ER IMMEDIATELY if you develop severe or worsening symptoms.

## 2018-01-27 ENCOUNTER — Telehealth: Payer: Self-pay | Admitting: Cardiology

## 2018-01-27 NOTE — Telephone Encounter (Signed)
Patient calling to follow up on phone note left on 01/16/18/tg

## 2018-01-28 NOTE — Telephone Encounter (Signed)
Returned pt call. He stated that he spoke with endocrinologist and they wanted Dr. Myles Gip advise on the dosage of Testerone.  I advised him that any recommendations were sent over on the test result faxed over to endocrinologists office on 1/10. I informed pt that Dr. Domenic Polite does not prescribe nor dose testosterone. He voiced understanding. I advised him to have the nurse from that office contact me for any further questions.

## 2018-02-03 DIAGNOSIS — R3915 Urgency of urination: Secondary | ICD-10-CM | POA: Diagnosis not present

## 2018-02-28 NOTE — Patient Instructions (Addendum)
Have a xray today of your neck.  Tests ordered today. Your results will be released to [facility] (or called to you) after review, usually within 72hours after test completion. If any changes need to be made, you will be notified at that same time.   Medications reviewed and updated.  Changes include :   none    Please followup in 6 months

## 2018-02-28 NOTE — Progress Notes (Signed)
Subjective:    Patient ID: Patrick Harris, male    DOB: 08-24-1945, 73 y.o.   MRN: [id number]  HPI The patient is here for follow up.  CAD, Hypertension: He is taking his medication daily. He is compliant with a low sodium diet.  He denies chest pain, palpitations, edema, shortness of breath and regular headaches. He is exercising regularly.  He does monitor his blood pressure at home and it is well controlled and on average- 135/68.    Prediabetes:  He is compliant with a low sugar/carbohydrate diet.  He is exercising regularly.  Hyperlipidemia: He is taking his medication daily. He is compliant with a low fat/cholesterol diet. He is exercising regularly. He denies myalgias.   BPH w/o obstructive urinary symptoms:  He is taking flomax nightly.    Neck pain:  It started a couple of years ago and was able to keep it under control with exercises.  He had bronchitis last month and his neck pain got much worse.  He has decreased ROM.  He denies radiating pain.  He has numbness/tinglling occasionally in his left hand at night.   He has upper back pain.  Ibuprofen helps a little.  He took so much it caused stomach pain.  His pain is 4/10.  Muscle relaxers have not helped.  He did call Dr. Donnella Bi office and they advised an MRI.  Pins and needles, burning feeling and pain from medial left lower leg down to toes.  The lateral lower leg feels cold and wet.  He feels the pain only at night.  The numbness/burning sensation occurs during the day and at night.   He denies lower back pain.  He denies any upper leg symptoms.   Medications and allergies reviewed with patient and updated if appropriate.  Patient Active Problem List   Diagnosis Date Noted  . Prediabetes 08/22/2017  . Umbilical hernia 92/33/0076  . Varicose veins of both lower extremities 05/22/2016  . Vitamin D deficiency 08/21/2015  . Hypertension 01/24/2015  . Family history of prostate cancer 08/19/2014  . Osteopenia  08/07/2009  . Hyperlipidemia 08/12/2007  . PITUITARY INSUFFICIENCY 08/05/2007  . Testosterone deficiency 08/05/2007  . Coronary atherosclerosis 08/05/2007  . GERD 08/05/2007  . BPH without obstruction/lower urinary tract symptoms 08/05/2007    Current Outpatient Medications on File Prior to Visit  Medication Sig Dispense Refill  . aspirin EC 81 MG tablet Take 1 tablet (81 mg total) by mouth daily. (Patient taking differently: Take 81 mg at bedtime by mouth. )    . bromocriptine (PARLODEL) 2.5 MG tablet Take 7.5 mg by mouth daily.     . Calcium-Magnesium-Zinc (CAL-MAG-ZINC PO) Take by mouth daily.      . Cholecalciferol (VITAMIN D3) 2000 UNITS TABS Take by mouth. 2000 iu in the am, 1000 iu in the pm    . Coenzyme Q10 (COQ10) 100 MG CAPS Take by mouth daily.      Marland Kitchen CRANBERRY PO Take by mouth. 1 tablespoon daily     . Flaxseed, Linseed, (FLAX SEED OIL PO) Take by mouth.      . Garlic 2263 MG CAPS Take 1,000 mg by mouth 2 (two) times daily.    . Iodine, Kelp, (KELP PO) Take by mouth daily.    Marland Kitchen L-Arginine 500 MG CAPS Take by mouth daily.      Marland Kitchen omeprazole (PRILOSEC) 20 MG capsule Take 1 capsule (20 mg total) by mouth daily. 90 capsule 3  . Pomegranate Seed 80 %  OIL by Does not apply route. 1 table spoon a day     . Potassium 99 MG TABS Take by mouth daily.      . Probiotic Product (PROBIOTIC DAILY PO) Take by mouth daily.    . rosuvastatin (CRESTOR) 20 MG tablet TAKE HALF A TABLET (10 MG) BY MOUTH ONCE DAILY. 45 tablet 3  . sildenafil (REVATIO) 20 MG tablet Take 20 mg by mouth daily.    . tamsulosin (FLOMAX) 0.4 MG CAPS capsule Take 1 capsule (0.4 mg total) by mouth daily. 90 capsule 3  . Turmeric 500 MG CAPS Take by mouth.      . valsartan (DIOVAN) 40 MG tablet Take 1 tablet (40 mg total) by mouth daily. 90 tablet 3  . vitamin C (ASCORBIC ACID) 500 MG tablet Take 500 mg by mouth 2 (two) times daily.     No current facility-administered medications on file prior to visit.     Past  Medical History:  Diagnosis Date  . Arthritis   . CAD (coronary artery disease)    CABG with LIMA to LAD and RIMA to RCA August 2000 - Dr. Roxan Hockey  . COPD (chronic obstructive pulmonary disease) ([place])   . Elevated glycated hemoglobin   . Essential hypertension   . GERD (gastroesophageal reflux disease)   . Heart murmur   . Hyperlipidemia   . Hypopituitarism ([place])   . Osteopenia    Dr Wilson Singer  . Pancreatitis   . Peyronie disease   . Testosterone deficiency    Dr Wilson Singer    Past Surgical History:  Procedure Laterality Date  . Bone spur  2006   5th toe bilaterally  . COLONOSCOPY  2007   negative; Dr Sharlett Iles  . CORONARY ARTERY BYPASS GRAFT  2001   2 vessels  . fracture heel  1967   Bil/ due to fall off ladder  . Fractured calcaneus  1969   bilaterally w/ ankle fusion  . HERNIA REPAIR  1984   right inguinal hernia  . MENISECTOMY  2007   R medial, Dr. Lorin Mercy  . [place]   left  . UPPER GASTROINTESTINAL ENDOSCOPY  2007   GERD, Dr Sharlett Iles  . VASECTOMY      Social History   Socioeconomic History  . Marital status: Married    Spouse name: Not on file  . Number of children: Not on file  . Years of education: Not on file  . Highest education level: Not on file  Occupational History  . Not on file  Social Needs  . Financial resource strain: Not on file  . Food insecurity:    Worry: Not on file    Inability: Not on file  . Transportation needs:    Medical: Not on file    Non-medical: Not on file  Tobacco Use  . Smoking status: Former Smoker    Last attempt to quit: 01/07/1998    Years since quitting: 20.1  . Smokeless tobacco: Never Used  . Tobacco comment: smoked 1963- 2000, up to 1 ppd  Substance and Sexual Activity  . Alcohol use: No  . Drug use: No  . Sexual activity: Not on file  Lifestyle  . Physical activity:    Days per week: Not on file    Minutes per session: Not on file  . Stress: Not on file  Relationships  . Social  connections:    Talks on phone: Not on file    Gets together: Not on file  Attends religious service: Not on file    Active member of club or organization: Not on file    Attends meetings of clubs or organizations: Not on file    Relationship status: Not on file  Other Topics Concern  . Not on file  Social History Narrative  . Not on file    Family History  Problem Relation Age of Onset  . Heart attack Father 51  . Diabetes Mother   . Stroke Mother 57  . Hypertension Mother   . Leukemia Sister   . Colon cancer Paternal Uncle   . Aortic aneurysm Brother        abdominal  . Heart disease Brother   . Atrial fibrillation Brother   . Cancer Maternal Uncle         X3:prostate , renal, bone   . Parkinson's disease Sister   . Stroke Sister   . Stroke Sister   . Stroke Sister   . Atrial fibrillation Sister     Review of Systems  Constitutional: Negative for chills and fever.  Respiratory: Negative for cough, shortness of breath and wheezing.   Cardiovascular: Negative for chest pain, palpitations and leg swelling.  Neurological: Negative for light-headedness and headaches.       Objective:   Vitals:   03/02/18 0849  BP: (!) 158/84  Pulse: 62  Resp: 16  Temp: 97.8 F (36.6 C)  SpO2: 98%   BP Readings from Last 3 Encounters:  03/02/18 (!) 158/84  01/25/18 (!) 144/70  01/01/18 140/72   Wt Readings from Last 3 Encounters:  03/02/18 181 lb (82.1 kg)  01/25/18 185 lb (83.9 kg)  01/01/18 185 lb (83.9 kg)   Body mass index is 24.55 kg/m.   Physical Exam    Constitutional: Appears well-developed and well-nourished. No distress.  HENT:  Head: Normocephalic and atraumatic.  Neck: Neck supple. No tracheal deviation present. No thyromegaly present.  No cervical lymphadenopathy Cardiovascular: Normal rate, regular rhythm and normal heart sounds.   No murmur heard. No carotid bruit .  No edema Pulmonary/Chest: Effort normal and breath sounds normal. No respiratory  distress. No has no wheezes. No rales.  Msk: Decreased range of motion cervical spine, mild tenderness with light palpation posterior neck and bilateral trapezius muscles, no cervical spine deformity Neurological: Normal sensation and strength bilateral upper extremities, decreased sensation left lower leg medial aspect along shin Skin: Skin is warm and dry. Not diaphoretic.  Psychiatric: Normal mood and affect. Behavior is normal.      Assessment & Plan:    See Problem List for Assessment and Plan of chronic medical problems.

## 2018-03-02 ENCOUNTER — Ambulatory Visit (INDEPENDENT_AMBULATORY_CARE_PROVIDER_SITE_OTHER): Payer: Medicare Other | Admitting: Internal Medicine

## 2018-03-02 ENCOUNTER — Encounter: Payer: Self-pay | Admitting: Internal Medicine

## 2018-03-02 ENCOUNTER — Ambulatory Visit (INDEPENDENT_AMBULATORY_CARE_PROVIDER_SITE_OTHER)
Admission: RE | Admit: 2018-03-02 | Discharge: 2018-03-02 | Disposition: A | Discharge status: Home / Self Care | Payer: Medicare Other | Source: Ambulatory Visit | Attending: Internal Medicine | Admitting: Internal Medicine

## 2018-03-02 VITALS — BP 158/84 | HR 62 | Temp 97.8°F | Resp 16 | Ht 72.0 in | Wt 181.0 lb

## 2018-03-02 DIAGNOSIS — E782 Mixed hyperlipidemia: Secondary | ICD-10-CM

## 2018-03-02 DIAGNOSIS — N400 Enlarged prostate without lower urinary tract symptoms: Secondary | ICD-10-CM | POA: Diagnosis not present

## 2018-03-02 DIAGNOSIS — I10 Essential (primary) hypertension: Secondary | ICD-10-CM | POA: Diagnosis not present

## 2018-03-02 DIAGNOSIS — M5030 Other cervical disc degeneration, unspecified cervical region: Secondary | ICD-10-CM

## 2018-03-02 DIAGNOSIS — R7303 Prediabetes: Secondary | ICD-10-CM | POA: Diagnosis not present

## 2018-03-02 DIAGNOSIS — I2510 Atherosclerotic heart disease of native coronary artery without angina pectoris: Secondary | ICD-10-CM

## 2018-03-02 DIAGNOSIS — M792 Neuralgia and neuritis, unspecified: Secondary | ICD-10-CM | POA: Insufficient documentation

## 2018-03-02 DIAGNOSIS — M542 Cervicalgia: Secondary | ICD-10-CM | POA: Diagnosis not present

## 2018-03-02 NOTE — Assessment & Plan Note (Signed)
No chest pain, palpitations or shortness of breath Exercising regularly Continue current medications

## 2018-03-02 NOTE — Assessment & Plan Note (Signed)
Chronic neck pain-ongoing for at least 2 years, worse last month with his episode of bronchitis-at that time it was severe Ibuprofen helped a little, but he had so much it caused stomach pain Muscle relaxers in the past have not helped Neurosurgery advised him getting an MRI and then make an appointment to see him X-ray today We will order MRI

## 2018-03-02 NOTE — Assessment & Plan Note (Signed)
Continue Flomax daily

## 2018-03-02 NOTE — Assessment & Plan Note (Signed)
Numbness/burning/pain in the left lower leg-medial aspect and wet/cold feeling in left lower leg lateral aspect likely neuralgia Discussed referral to neurology He will be seen his neurosurgeon and will discuss with him first He will let me know if he wants a referral Discussed gabapentin-deferred for today

## 2018-03-02 NOTE — Assessment & Plan Note (Signed)
Blood pressure well controlled at home-slightly elevated here for unknown reason We will continue to monitor it at home Continue current medication

## 2018-03-02 NOTE — Assessment & Plan Note (Addendum)
Blood work done 2 months ago by his endocrinologist-lipids well controlled No need for repeat  Continue regular exercise Follow-up in 6 months

## 2018-03-02 NOTE — Assessment & Plan Note (Signed)
Continue low sugar/carbohydrate diet A1c 5.9% 2 months ago-done by endocrinology A1c improved Continue regular exercise Follow-up in 6 months

## 2018-03-10 ENCOUNTER — Encounter: Payer: Self-pay | Admitting: Internal Medicine

## 2018-03-12 ENCOUNTER — Ambulatory Visit (HOSPITAL_COMMUNITY)
Admission: RE | Admit: 2018-03-12 | Discharge: 2018-03-12 | Disposition: A | Discharge status: Home / Self Care | Payer: Medicare Other | Source: Ambulatory Visit | Attending: Internal Medicine | Admitting: Internal Medicine

## 2018-03-12 DIAGNOSIS — M542 Cervicalgia: Secondary | ICD-10-CM | POA: Diagnosis not present

## 2018-03-12 DIAGNOSIS — M5030 Other cervical disc degeneration, unspecified cervical region: Secondary | ICD-10-CM | POA: Diagnosis not present

## 2018-03-17 ENCOUNTER — Telehealth: Payer: Self-pay

## 2018-03-17 DIAGNOSIS — M542 Cervicalgia: Secondary | ICD-10-CM | POA: Diagnosis not present

## 2018-03-17 DIAGNOSIS — D352 Benign neoplasm of pituitary gland: Secondary | ICD-10-CM | POA: Diagnosis not present

## 2018-03-17 NOTE — Telephone Encounter (Signed)
MRI has been faxed over.

## 2018-03-17 NOTE — Telephone Encounter (Signed)
Copied from [place] [phone]. Topic: General - Other >> Mar 17, 2018 11:10 AM Bea Graff, NT wrote: Reason for CRM: Pt states that he got in with his orthopedic, Dr. French Ana at 2pm today and would like to see if the MRI of his neck can be faxed to them before his appt? Fax#: [phone]

## 2018-03-24 DIAGNOSIS — I25810 Atherosclerosis of coronary artery bypass graft(s) without angina pectoris: Secondary | ICD-10-CM | POA: Diagnosis not present

## 2018-03-24 DIAGNOSIS — E785 Hyperlipidemia, unspecified: Secondary | ICD-10-CM | POA: Diagnosis not present

## 2018-03-24 DIAGNOSIS — Z8781 Personal history of (healed) traumatic fracture: Secondary | ICD-10-CM | POA: Diagnosis not present

## 2018-03-24 DIAGNOSIS — D352 Benign neoplasm of pituitary gland: Secondary | ICD-10-CM | POA: Diagnosis not present

## 2018-03-24 DIAGNOSIS — Z87898 Personal history of other specified conditions: Secondary | ICD-10-CM | POA: Diagnosis not present

## 2018-03-24 DIAGNOSIS — M8589 Other specified disorders of bone density and structure, multiple sites: Secondary | ICD-10-CM | POA: Diagnosis not present

## 2018-03-30 DIAGNOSIS — M47812 Spondylosis without myelopathy or radiculopathy, cervical region: Secondary | ICD-10-CM | POA: Diagnosis not present

## 2018-03-30 DIAGNOSIS — M542 Cervicalgia: Secondary | ICD-10-CM | POA: Diagnosis not present

## 2018-04-27 DIAGNOSIS — M25552 Pain in left hip: Secondary | ICD-10-CM | POA: Diagnosis not present

## 2018-04-27 DIAGNOSIS — M47812 Spondylosis without myelopathy or radiculopathy, cervical region: Secondary | ICD-10-CM | POA: Diagnosis not present

## 2018-04-27 DIAGNOSIS — M5416 Radiculopathy, lumbar region: Secondary | ICD-10-CM | POA: Diagnosis not present

## 2018-04-27 DIAGNOSIS — M542 Cervicalgia: Secondary | ICD-10-CM | POA: Diagnosis not present

## 2018-07-17 NOTE — Progress Notes (Addendum)
Subjective:   Patrick Harris is a 73 y.o. male who presents for Medicare Annual/Subsequent preventive examination. I connected with patient by a telephone and verified that I am speaking with the correct person using two identifiers. Patient stated full name and DOB. Patient gave permission to continue with telephonic visit. Patient's location was at home and Nurse's location was at [place] office.   Review of Systems:   Cardiac Risk Factors include: advanced age (>73men, >42 women);dyslipidemia;hypertension;male gender Sleep patterns: feels rested on waking, does not get up to void, gets up 0-1 times nightly to void and sleeps 7-8 hours nightly.    Home Safety/Smoke Alarms: Feels safe in home. Smoke alarms in place.  Living environment; residence and Firearm Safety: 1-story house/ trailer. Lives with wife, no needs for DME, good support system Seat Belt Safety/Bike Helmet: Wears seat belt.   PSA-  Lab Results  Component Value Date   PSA 1.69 08/12/2016   PSA 1.54 08/21/2015   PSA 1.39 08/19/2014       Objective:    Vitals: There were no vitals taken for this visit.  There is no height or weight on file to calculate BMI.  Advanced Directives 07/20/2018 01/25/2018 11/15/2016 05/18/2016 04/08/2016 01/18/2015  Does Patient Have a Medical Advance Directive? Yes Yes No Yes No Yes  Type of Paramedic of [place];Living will [place];Living will - - - [place];Living will  Copy of [place] in Chart? No - copy requested - - - - -  Would patient like information on creating a medical advance directive? - - No - Patient declined - - -    Tobacco Social History   Tobacco Use  Smoking Status Former Smoker  . Quit date: 01/07/1998  . Years since quitting: 20.5  Smokeless Tobacco Never Used  Tobacco Comment   smoked 1963- 2000, up to 1 ppd     Counseling given: Not Answered Comment: smoked 1963- 2000, up  to 1 ppd  Past Medical History:  Diagnosis Date  . Arthritis   . CAD (coronary artery disease)    CABG with LIMA to LAD and RIMA to RCA August 2000 - Dr. Roxan Hockey  . COPD (chronic obstructive pulmonary disease) ([place])   . Elevated glycated hemoglobin   . Essential hypertension   . GERD (gastroesophageal reflux disease)   . Heart murmur   . Hyperlipidemia   . Hypopituitarism ([place])   . Osteopenia    Dr Wilson Singer  . Pancreatitis   . P[place]ronie disease   . Testosterone deficiency    Dr Wilson Singer   Past Surgical History:  Procedure Laterality Date  . Bone spur  2006   5th toe bilaterally  . COLONOSCOPY  2007   negative; Dr Sharlett Iles  . CORONARY ARTERY BYPASS GRAFT  2001   2 vessels  . fracture heel  1967   Bil/ due to fall off ladder  . Fractured calcaneus  1969   bilaterally w/ ankle fusion  . HERNIA REPAIR  1984   right inguinal hernia  . MENISECTOMY  2007   R medial, Dr. Lorin Mercy  . [place]   left  . UPPER GASTROINTESTINAL ENDOSCOPY  2007   GERD, Dr Sharlett Iles  . VASECTOMY     Family History  Problem Relation Age of Onset  . Heart attack Father 8  . Diabetes Mother   . Stroke Mother 77  . Hypertension Mother   . Leukemia Sister   .  Colon cancer Paternal Uncle   . Aortic aneurysm Brother        abdominal  . Heart disease Brother   . Atrial fibrillation Brother   . Cancer Maternal Uncle         X3:prostate , renal, bone   . Parkinson's disease Sister   . Stroke Sister   . Stroke Sister   . Stroke Sister   . Atrial fibrillation Sister    Social History   Socioeconomic History  . Marital status: Married    Spouse name: Not on file  . Number of children: 1  . Years of education: Not on file  . Highest education level: Not on file  Occupational History  . Occupation: retired  Scientific laboratory technician  . Financial resource strain: Not hard at all  . Food insecurity    Worry: Never true    Inability: Never true  . Transportation needs    Medical: No     Non-medical: No  Tobacco Use  . Smoking status: Former Smoker    Quit date: 01/07/1998    Years since quitting: 20.5  . Smokeless tobacco: Never Used  . Tobacco comment: smoked 1963- 2000, up to 1 ppd  Substance and Sexual Activity  . Alcohol use: No  . Drug use: No  . Sexual activity: Yes  Lifestyle  . Physical activity    Days per week: 6 days    Minutes per session: 40 min  . Stress: Not at all  Relationships  . Social connections    Talks on phone: More than three times a week    Gets together: More than three times a week    Attends religious service: More than 4 times per year    Active member of club or organization: Yes    Attends meetings of clubs or organizations: More than 4 times per year    Relationship status: Married  Other Topics Concern  . Not on file  Social History Narrative  . Not on file    Outpatient Encounter Medications as of 07/20/2018  Medication Sig  . acetaminophen (TYLENOL) 500 MG tablet Take 500 mg by mouth every 6 (six) hours as needed.  Marland Kitchen aspirin EC 81 MG tablet Take 1 tablet (81 mg total) by mouth daily. (Patient taking differently: Take 81 mg at bedtime by mouth. )  . bromocriptine (PARLODEL) 2.5 MG tablet Take 7.5 mg by mouth daily.   . Calcium-Magnesium-Zinc (CAL-MAG-ZINC PO) Take by mouth daily.    . Cholecalciferol (VITAMIN D3) 2000 UNITS TABS Take by mouth. 2000 iu in the am, 1000 iu in the pm  . Coenzyme Q10 (COQ10) 100 MG CAPS Take by mouth daily.    Marland Kitchen CRANBERRY PO Take by mouth. 1 tablespoon daily   . Flaxseed, Linseed, (FLAX SEED OIL PO) Take by mouth.    . Garlic 2202 MG CAPS Take 1,000 mg by mouth 2 (two) times daily.  . Iodine, Kelp, (KELP PO) Take by mouth daily.  Marland Kitchen L-Arginine 500 MG CAPS Take by mouth daily.    Marland Kitchen omeprazole (PRILOSEC) 20 MG capsule Take 1 capsule (20 mg total) by mouth daily.  . Pomegranate Seed 80 % OIL by Does not apply route. 1 table spoon a day   . Potassium 99 MG TABS Take by mouth daily.    . Probiotic  Product (PROBIOTIC DAILY PO) Take by mouth daily.  . rosuvastatin (CRESTOR) 20 MG tablet TAKE HALF A TABLET (10 MG) BY MOUTH ONCE DAILY.  Marland Kitchen  sildenafil (REVATIO) 20 MG tablet Take 20 mg by mouth daily.  . tamsulosin (FLOMAX) 0.4 MG CAPS capsule Take 1 capsule (0.4 mg total) by mouth daily.  . Turmeric 500 MG CAPS Take by mouth.    . valsartan (DIOVAN) 40 MG tablet Take 1 tablet (40 mg total) by mouth daily.  . vitamin C (ASCORBIC ACID) 500 MG tablet Take 500 mg by mouth 2 (two) times daily.   No facility-administered encounter medications on file as of 07/20/2018.     Activities of Daily Living In your present state of health, do you have any difficulty performing the following activities: 07/20/2018  Hearing? N  Vision? N  Difficulty concentrating or making decisions? N  Walking or climbing stairs? N  Dressing or bathing? N  Doing errands, shopping? N  Preparing Food and eating ? N  Using the Toilet? N  In the past six months, have you accidently leaked urine? N  Do you have problems with loss of bowel control? N  Managing your Medications? N  Managing your Finances? N  Housekeeping or managing your Housekeeping? N  Some recent data might be hidden    Patient Care Team: [name], MD as PCP - General (Internal Medicine) Satira Sark, MD as PCP - Cardiology (Cardiology) Ladene Artist, MD as Consulting Physician (Gastroenterology) Anda Kraft, MD as Consulting Physician (Endocrinology)   Assessment:   This is a routine wellness examination for Patrick Harris. Physical assessment deferred to PCP.   Exercise Activities and Dietary recommendations Current Exercise Habits: Home exercise routine, Type of exercise: calisthenics;stretching, Time (Minutes): 40, Frequency (Times/Week): 6, Weekly Exercise (Minutes/Week): 240, Intensity: Mild, Exercise limited by: None identified Diet (meal preparation, eat out, water intake, caffeinated beverages, dairy products, fruits and  vegetables): in general, a "healthy" diet  , well balanced. eats a variety of fruits and vegetables daily.  Reviewed heart healthy diet. Encouraged patient to maintain daily water and healthy fluid intake.   Goals   None     Fall Risk Fall Risk  07/20/2018 03/02/2018 08/21/2016 08/21/2015 08/13/2012  Falls in the past year? 0 0 No No No  Number falls in past yr: 0 - - - -    Depression Screen PHQ 2/9 Scores 07/20/2018 03/02/2018 08/22/2017 08/21/2016  PHQ - 2 Score 0 0 0 0    Cognitive Function       Ad8 score reviewed for issues:  Issues making decisions: no  Less interest in hobbies / activities: no  Repeats questions, stories (family complaining): no  Trouble using ordinary gadgets (microwave, computer, phone):no  Forgets the month or year: no  Mismanaging finances: no  Remembering appts: no  Daily problems with thinking and/or memory: no Ad8 score is= 0  Immunization History  Administered Date(s) Administered  . Influenza Whole 10/07/2007, 10/17/2008  . Influenza, High Dose Seasonal PF 10/16/2012, 10/15/2013, 10/20/2017  . Influenza-Unspecified 10/25/2015, 10/14/2016  . Pneumococcal Conjugate-13 08/19/2014  . Pneumococcal Polysaccharide-23 08/09/2010  . Td 01/07/2001  . Tdap 09/30/2011   Screening Tests Health Maintenance  Topic Date Due  . INFLUENZA VACCINE  08/08/2018  . COLONOSCOPY  08/27/2021  . TETANUS/TDAP  09/29/2021  . Hepatitis C Screening  Completed  . PNA vac Low Risk Adult  Completed        Plan:    Reviewed health maintenance screenings with patient today and relevant education, vaccines, and/or referrals were provided.   Continue to eat heart healthy diet (full of fruits, vegetables, whole grains, lean protein, water--limit  salt, fat, and sugar intake) and increase physical activity as tolerated.  Continue doing brain stimulating activities (puzzles, reading, adult coloring books, staying active) to keep memory sharp.   I have  personally reviewed and noted the following in the patient's chart:   . Medical and social history . Use of alcohol, tobacco or illicit drugs  . Current medications and supplements . Functional ability and status . Nutritional status . Physical activity . Advanced directives . List of other physicians . Screenings to include cognitive, depression, and falls . Referrals and appointments  In addition, I have reviewed and discussed with patient certain preventive protocols, quality metrics, and best practice recommendations. A written personalized care plan for preventive services as well as general preventive health recommendations were provided to patient.     Michiel Cowboy, RN  07/20/2018    Medical screening examination/treatment/procedure(s) were performed by non-physician practitioner and as supervising physician I was immediately available for consultation/collaboration. I agree with above. [name], MD

## 2018-07-20 ENCOUNTER — Ambulatory Visit (INDEPENDENT_AMBULATORY_CARE_PROVIDER_SITE_OTHER): Payer: Medicare Other | Admitting: *Deleted

## 2018-07-20 DIAGNOSIS — Z0000 Encounter for general adult medical examination without abnormal findings: Secondary | ICD-10-CM | POA: Diagnosis not present

## 2018-08-23 MED ORDER — ASPIRIN EC 81 MG PO TBEC: 81.0000 mg | DELAYED_RELEASE_TABLET | Freq: Every day | ORAL | Status: DC

## 2018-08-23 NOTE — Patient Instructions (Addendum)
Tests ordered today. Your results will be released to [place] (or called to you) after review.  If any changes need to be made, you will be notified at that same time.  All other Health Maintenance issues reviewed.   All recommended immunizations and age-appropriate screenings are up-to-date or discussed.  No immunization administered today.   Medications reviewed and updated.  Changes include :   none    Please followup in 1 year    Health Maintenance, Male Adopting a healthy lifestyle and getting preventive care are important in promoting health and wellness. Ask your health care provider about:  The right schedule for you to have regular tests and exams.  Things you can do on your own to prevent diseases and keep yourself healthy. What should I know about diet, weight, and exercise? Eat a healthy diet   Eat a diet that includes plenty of vegetables, fruits, low-fat dairy products, and lean protein.  Do not eat a lot of foods that are high in solid fats, added sugars, or sodium. Maintain a healthy weight Body mass index (BMI) is a measurement that can be used to identify possible weight problems. It estimates body fat based on height and weight. Your health care provider can help determine your BMI and help you achieve or maintain a healthy weight. Get regular exercise Get regular exercise. This is one of the most important things you can do for your health. Most adults should:  Exercise for at least 150 minutes each week. The exercise should increase your heart rate and make you sweat (moderate-intensity exercise).  Do strengthening exercises at least twice a week. This is in addition to the moderate-intensity exercise.  Spend less time sitting. Even light physical activity can be beneficial. Watch cholesterol and blood lipids Have your blood tested for lipids and cholesterol at 73 years of age, then have this test every 5 years. You may need to have your cholesterol  levels checked more often if:  Your lipid or cholesterol levels are high.  You are older than 73 years of age.  You are at high risk for heart disease. What should I know about cancer screening? Many types of cancers can be detected early and may often be prevented. Depending on your health history and family history, you may need to have cancer screening at various ages. This may include screening for:  Colorectal cancer.  Prostate cancer.  Skin cancer.  Lung cancer. What should I know about heart disease, diabetes, and high blood pressure? Blood pressure and heart disease  High blood pressure causes heart disease and increases the risk of stroke. This is more likely to develop in people who have high blood pressure readings, are of African descent, or are overweight.  Talk with your health care provider about your target blood pressure readings.  Have your blood pressure checked: ? Every 3-5 years if you are 63-78 years of age. ? Every year if you are 56 years old or older.  If you are between the ages of 81 and 31 and are a current or former smoker, ask your health care provider if you should have a one-time screening for abdominal aortic aneurysm (AAA). Diabetes Have regular diabetes screenings. This checks your fasting blood sugar level. Have the screening done:  Once every three years after age 37 if you are at a normal weight and have a low risk for diabetes.  More often and at a younger age if you are overweight or have a high  risk for diabetes. What should I know about preventing infection? Hepatitis B If you have a higher risk for hepatitis B, you should be screened for this virus. Talk with your health care provider to find out if you are at risk for hepatitis B infection. Hepatitis C Blood testing is recommended for:  Everyone born from [birth year] through 1965.  Anyone with known risk factors for hepatitis C. Sexually transmitted infections (STIs)  You should be  screened each year for STIs, including gonorrhea and chlamydia, if: ? You are sexually active and are younger than 73 years of age. ? You are older than 73 years of age and your health care provider tells you that you are at risk for this type of infection. ? Your sexual activity has changed since you were last screened, and you are at increased risk for chlamydia or gonorrhea. Ask your health care provider if you are at risk.  Ask your health care provider about whether you are at high risk for HIV. Your health care provider may recommend a prescription medicine to help prevent HIV infection. If you choose to take medicine to prevent HIV, you should first get tested for HIV. You should then be tested every 3 months for as long as you are taking the medicine. Follow these instructions at home: Lifestyle  Do not use any products that contain nicotine or tobacco, such as cigarettes, e-cigarettes, and chewing tobacco. If you need help quitting, ask your health care provider.  Do not use street drugs.  Do not share needles.  Ask your health care provider for help if you need support or information about quitting drugs. Alcohol use  Do not drink alcohol if your health care provider tells you not to drink.  If you drink alcohol: ? Limit how much you have to 0-2 drinks a day. ? Be aware of how much alcohol is in your drink. In the [place] one drink equals one 12 oz bottle of beer (355 mL), one 5 oz glass of wine (148 mL), or one 1 oz glass of hard liquor (44 mL). General instructions  Schedule regular health, dental, and eye exams.  Stay current with your vaccines.  Tell your health care provider if: ? You often feel depressed. ? You have ever been abused or do not feel safe at home. Summary  Adopting a healthy lifestyle and getting preventive care are important in promoting health and wellness.  Follow your health care provider's instructions about healthy diet, exercising, and getting  tested or screened for diseases.  Follow your health care provider's instructions on monitoring your cholesterol and blood pressure. This information is not intended to replace advice given to you by your health care provider. Make sure you discuss any questions you have with your health care provider. Document Released: 06/22/2007 Document Revised: 12/17/2017 Document Reviewed: 12/17/2017 Elsevier Patient Education  2020 [employer].

## 2018-08-23 NOTE — Progress Notes (Signed)
Subjective:    Patient ID: Patrick Harris, male    DOB: 21-Aug-1945, 73 y.o.   MRN: [id number]  HPI He is here for a physical exam.   He has been having neck issues and did see an orthopedic - he took prednisone and his symptoms improved.  For years he has had leg fatigue, tingling/numbness and weakness after walking a short distance - sitting improves the symptoms.  He has not had this evaluated.   He is still off the testosterone - he has a f/u with endocrine soon - she is concerned about his risk with the medication and stopped it last year.  He may have urology prescribe it for him.    Medications and allergies reviewed with patient and updated if appropriate.  Patient Active Problem List   Diagnosis Date Noted  . Neck pain 03/02/2018  . Neuralgia 03/02/2018  . Prediabetes 08/22/2017  . Umbilical hernia 40/98/1191  . Varicose veins of both lower extremities 05/22/2016  . Vitamin D deficiency 08/21/2015  . Hypertension 01/24/2015  . Family history of prostate cancer 08/19/2014  . Osteopenia 08/07/2009  . Hyperlipidemia 08/12/2007  . PITUITARY INSUFFICIENCY 08/05/2007  . Testosterone deficiency 08/05/2007  . Coronary atherosclerosis 08/05/2007  . GERD 08/05/2007  . BPH without obstruction/lower urinary tract symptoms 08/05/2007    Current Outpatient Medications on File Prior to Visit  Medication Sig Dispense Refill  . acetaminophen (TYLENOL) 500 MG tablet Take 500 mg by mouth every 6 (six) hours as needed.    . bromocriptine (PARLODEL) 2.5 MG tablet Take 7.5 mg by mouth daily.     . Calcium-Magnesium-Zinc (CAL-MAG-ZINC PO) Take by mouth daily.      . Cholecalciferol (VITAMIN D3) 2000 UNITS TABS Take by mouth. 2000 iu in the am, 1000 iu in the pm    . Coenzyme Q10 (COQ10) 100 MG CAPS Take by mouth daily.      Marland Kitchen CRANBERRY PO Take by mouth. 1 tablespoon daily     . Flaxseed, Linseed, (FLAX SEED OIL PO) Take by mouth.      . Garlic 4782 MG CAPS Take 1,000 mg by mouth 2  (two) times daily.    . Iodine, Kelp, (KELP PO) Take by mouth daily.    Marland Kitchen L-Arginine 500 MG CAPS Take by mouth daily.      Marland Kitchen omeprazole (PRILOSEC) 20 MG capsule Take 1 capsule (20 mg total) by mouth daily. 90 capsule 3  . Pomegranate Seed 80 % OIL by Does not apply route. 1 table spoon a day     . Potassium 99 MG TABS Take by mouth daily.      . Probiotic Product (PROBIOTIC DAILY PO) Take by mouth daily.    . rosuvastatin (CRESTOR) 20 MG tablet TAKE HALF A TABLET (10 MG) BY MOUTH ONCE DAILY. 45 tablet 3  . sildenafil (REVATIO) 20 MG tablet Take 20 mg by mouth daily.    . tamsulosin (FLOMAX) 0.4 MG CAPS capsule Take 1 capsule (0.4 mg total) by mouth daily. 90 capsule 3  . Turmeric 500 MG CAPS Take by mouth.      . valsartan (DIOVAN) 40 MG tablet Take 1 tablet (40 mg total) by mouth daily. 90 tablet 3  . vitamin C (ASCORBIC ACID) 500 MG tablet Take 500 mg by mouth 2 (two) times daily.     No current facility-administered medications on file prior to visit.     Past Medical History:  Diagnosis Date  . Arthritis   .  CAD (coronary artery disease)    CABG with LIMA to LAD and RIMA to RCA August 2000 - Dr. Roxan Hockey  . COPD (chronic obstructive pulmonary disease) ([place])   . Elevated glycated hemoglobin   . Essential hypertension   . GERD (gastroesophageal reflux disease)   . Heart murmur   . Hyperlipidemia   . Hypopituitarism ([place])   . Osteopenia    Dr Wilson Singer  . Pancreatitis   . Peyronie disease   . Testosterone deficiency    Dr Wilson Singer    Past Surgical History:  Procedure Laterality Date  . Bone spur  2006   5th toe bilaterally  . COLONOSCOPY  2007   negative; Dr Sharlett Iles  . CORONARY ARTERY BYPASS GRAFT  2001   2 vessels  . fracture heel  1967   Bil/ due to fall off ladder  . Fractured calcaneus  1969   bilaterally w/ ankle fusion  . HERNIA REPAIR  1984   right inguinal hernia  . MENISECTOMY  2007   R medial, Dr. Lorin Mercy  . [place]   left  . UPPER  GASTROINTESTINAL ENDOSCOPY  2007   GERD, Dr Sharlett Iles  . VASECTOMY      Social History   Socioeconomic History  . Marital status: Married    Spouse name: Not on file  . Number of children: 1  . Years of education: Not on file  . Highest education level: Not on file  Occupational History  . Occupation: retired  Scientific laboratory technician  . Financial resource strain: Not hard at all  . Food insecurity    Worry: Never true    Inability: Never true  . Transportation needs    Medical: No    Non-medical: No  Tobacco Use  . Smoking status: Former Smoker    Quit date: 01/07/1998    Years since quitting: 20.6  . Smokeless tobacco: Never Used  . Tobacco comment: smoked 1963- 2000, up to 1 ppd  Substance and Sexual Activity  . Alcohol use: No  . Drug use: No  . Sexual activity: Yes  Lifestyle  . Physical activity    Days per week: 6 days    Minutes per session: 40 min  . Stress: Not at all  Relationships  . Social connections    Talks on phone: More than three times a week    Gets together: More than three times a week    Attends religious service: More than 4 times per year    Active member of club or organization: Yes    Attends meetings of clubs or organizations: More than 4 times per year    Relationship status: Married  Other Topics Concern  . Not on file  Social History Narrative  . Not on file    Family History  Problem Relation Age of Onset  . Heart attack Father 31  . Diabetes Mother   . Stroke Mother 76  . Hypertension Mother   . Leukemia Sister   . Colon cancer Paternal Uncle   . Aortic aneurysm Brother        abdominal  . Heart disease Brother   . Atrial fibrillation Brother   . Cancer Maternal Uncle         X3:prostate , renal, bone   . Parkinson's disease Sister   . Stroke Sister   . Stroke Sister   . Stroke Sister   . Atrial fibrillation Sister     Review of Systems  Constitutional: Negative for  chills and fever.  Eyes: Negative for visual disturbance.   Respiratory: Negative for cough, shortness of breath and wheezing.   Cardiovascular: Negative for chest pain, palpitations and leg swelling.  Gastrointestinal: Negative for abdominal pain, blood in stool, constipation, diarrhea and nausea.       GERD controlled  Genitourinary: Negative for dysuria and hematuria.  Musculoskeletal: Negative for back pain.  Skin: Negative for color change and rash.  Neurological: Positive for weakness (legs) and numbness (legs). Negative for light-headedness and headaches.  Psychiatric/Behavioral: Negative for dysphoric mood. The patient is not nervous/anxious.        Objective:   Vitals:   08/24/18 0823  BP: 138/60  Pulse: (!) 49  Resp: 16  Temp: 98 F (36.7 C)  SpO2: 97%   Filed Weights   08/24/18 0823  Weight: 181 lb 9.6 oz (82.4 kg)   Body mass index is 24.63 kg/m.  Wt Readings from Last 3 Encounters:  08/24/18 181 lb 9.6 oz (82.4 kg)  03/02/18 181 lb (82.1 kg)  01/25/18 185 lb (83.9 kg)     Physical Exam Constitutional: He appears well-developed and well-nourished. No distress.  HENT:  Head: Normocephalic and atraumatic.  Right Ear: External ear normal.  Left Ear: External ear normal.  Mouth/Throat: Oropharynx is clear and moist.  Normal ear canals and TM b/l  Eyes: Conjunctivae and EOM are normal.  Neck: Neck supple. No tracheal deviation present. No thyromegaly present.  No carotid bruit  Cardiovascular: Normal rate, regular rhythm, normal heart sounds and intact distal pulses.   No murmur heard. Pulmonary/Chest: Effort normal and breath sounds normal. No respiratory distress. He has no wheezes. He has no rales.  Abdominal: Soft. He exhibits no distension. There is no tenderness.  Genitourinary: deferred  Musculoskeletal: He exhibits no edema.  Lymphadenopathy:   He has no cervical adenopathy.  Skin: Skin is warm and dry. He is not diaphoretic.  Psychiatric: He has a normal mood and affect. His behavior is normal.          Assessment & Plan:   Physical exam: Screening blood work   ordered Immunizations   Discussed shingrix, others up to date Colonoscopy    Up to date  Eye exams  Not up to date  Exercise  Band exercises, exercises for neck and back Weight  Normal BMI Skin   No concerns Substance abuse  none  See Problem List for Assessment and Plan of chronic medical problems.

## 2018-08-24 ENCOUNTER — Other Ambulatory Visit: Payer: Self-pay

## 2018-08-24 ENCOUNTER — Encounter: Payer: Self-pay | Admitting: Internal Medicine

## 2018-08-24 ENCOUNTER — Ambulatory Visit (INDEPENDENT_AMBULATORY_CARE_PROVIDER_SITE_OTHER): Payer: Medicare Other | Admitting: Internal Medicine

## 2018-08-24 ENCOUNTER — Other Ambulatory Visit (INDEPENDENT_AMBULATORY_CARE_PROVIDER_SITE_OTHER): Payer: Medicare Other

## 2018-08-24 VITALS — BP 138/60 | HR 49 | Temp 98.0°F | Resp 16 | Ht 72.0 in | Wt 181.6 lb

## 2018-08-24 DIAGNOSIS — E559 Vitamin D deficiency, unspecified: Secondary | ICD-10-CM

## 2018-08-24 DIAGNOSIS — I10 Essential (primary) hypertension: Secondary | ICD-10-CM

## 2018-08-24 DIAGNOSIS — R7303 Prediabetes: Secondary | ICD-10-CM | POA: Diagnosis not present

## 2018-08-24 DIAGNOSIS — E230 Hypopituitarism: Secondary | ICD-10-CM

## 2018-08-24 DIAGNOSIS — Z0000 Encounter for general adult medical examination without abnormal findings: Secondary | ICD-10-CM

## 2018-08-24 DIAGNOSIS — M48062 Spinal stenosis, lumbar region with neurogenic claudication: Secondary | ICD-10-CM

## 2018-08-24 DIAGNOSIS — E782 Mixed hyperlipidemia: Secondary | ICD-10-CM

## 2018-08-24 DIAGNOSIS — K219 Gastro-esophageal reflux disease without esophagitis: Secondary | ICD-10-CM | POA: Diagnosis not present

## 2018-08-24 DIAGNOSIS — I739 Peripheral vascular disease, unspecified: Secondary | ICD-10-CM | POA: Insufficient documentation

## 2018-08-24 DIAGNOSIS — N400 Enlarged prostate without lower urinary tract symptoms: Secondary | ICD-10-CM

## 2018-08-24 DIAGNOSIS — I2510 Atherosclerotic heart disease of native coronary artery without angina pectoris: Secondary | ICD-10-CM

## 2018-08-24 DIAGNOSIS — G9519 Other vascular myelopathies: Secondary | ICD-10-CM

## 2018-08-24 DIAGNOSIS — Z125 Encounter for screening for malignant neoplasm of prostate: Secondary | ICD-10-CM | POA: Diagnosis not present

## 2018-08-24 LAB — COMPREHENSIVE METABOLIC PANEL
ALT: 15 U/L (ref 0–53)
AST: 18 U/L (ref 0–37)
Albumin: 4.3 g/dL (ref 3.5–5.2)
Alkaline Phosphatase: 61 U/L (ref 39–117)
BUN: 15 mg/dL (ref 6–23)
CO2: 27 mEq/L (ref 19–32)
Calcium: 9.4 mg/dL (ref 8.4–10.5)
Chloride: 105 mEq/L (ref 96–112)
Creatinine, Ser: 1 mg/dL (ref 0.40–1.50)
GFR: 73.22 mL/min (ref >60.00)
Glucose, Bld: 92 mg/dL (ref 70–99)
Potassium: 4.2 mEq/L (ref 3.5–5.1)
Sodium: 139 mEq/L (ref 135–145)
Total Bilirubin: 0.6 mg/dL (ref 0.2–1.2)
Total Protein: 7 g/dL (ref 6.0–8.3)

## 2018-08-24 LAB — CBC WITH DIFFERENTIAL/PLATELET
Basophils Absolute: 0 10*3/uL (ref 0.0–0.1)
Basophils Relative: 0.6 % (ref 0.0–3.0)
Eosinophils Absolute: 0.1 10*3/uL (ref 0.0–0.7)
Eosinophils Relative: 1.3 % (ref 0.0–5.0)
HCT: 44.2 % (ref 39.0–52.0)
Hemoglobin: 14.9 g/dL (ref 13.0–17.0)
Lymphocytes Relative: 28.2 % (ref 12.0–46.0)
Lymphs Abs: 1.6 10*3/uL (ref 0.7–4.0)
MCHC: 33.8 g/dL (ref 30.0–36.0)
MCV: 94.4 fl (ref 78.0–100.0)
Monocytes Absolute: 0.6 10*3/uL (ref 0.1–1.0)
Monocytes Relative: 10 % (ref 3.0–12.0)
Neutro Abs: 3.5 10*3/uL (ref 1.4–7.7)
Neutrophils Relative %: 59.9 % (ref 43.0–77.0)
Platelets: 184 10*3/uL (ref 150.0–400.0)
RBC: 4.68 Mil/uL (ref 4.22–5.81)
RDW: 13.6 % (ref 11.5–15.5)
WBC: 5.8 10*3/uL (ref 4.0–10.5)

## 2018-08-24 LAB — HEMOGLOBIN A1C: Hgb A1c MFr Bld: 6.1 % (ref 4.6–6.5)

## 2018-08-24 LAB — VITAMIN D 25 HYDROXY (VIT D DEFICIENCY, FRACTURES): VITD: 64.84 ng/mL (ref 30.00–100.00)

## 2018-08-24 LAB — LIPID PANEL
Cholesterol: 146 mg/dL (ref 0–200)
HDL: 34.2 mg/dL — ABNORMAL LOW (ref >39.00)
LDL Cholesterol: 86 mg/dL (ref 0–99)
NonHDL: 112.16
Total CHOL/HDL Ratio: 4
Triglycerides: 130 mg/dL (ref 0.0–149.0)
VLDL: 26 mg/dL (ref 0.0–40.0)

## 2018-08-24 LAB — PSA, MEDICARE: PSA: 1 ng/ml (ref 0.10–4.00)

## 2018-08-24 NOTE — Assessment & Plan Note (Signed)
Check lipid panel, tsh, cmp  Continue daily statin Regular exercise and healthy diet encouraged

## 2018-08-24 NOTE — Assessment & Plan Note (Signed)
GERD controlled Continue daily medication  

## 2018-08-24 NOTE — Assessment & Plan Note (Signed)
Following with endocrine On bromocriptine

## 2018-08-24 NOTE — Assessment & Plan Note (Signed)
Following with urology PSA today

## 2018-08-24 NOTE — Assessment & Plan Note (Signed)
BP well controlled Current regimen effective and well tolerated Continue current medications at current doses cmp  

## 2018-08-24 NOTE — Assessment & Plan Note (Signed)
Following with cardiology No chest pain, sob Continue current medications

## 2018-08-24 NOTE — Assessment & Plan Note (Signed)
B/l legs with symptoms of neurogenic claudication vs PAD ? Spinal stenosis Advised seeing ortho again to have this evaluated

## 2018-08-24 NOTE — Assessment & Plan Note (Signed)
Taking vitamin d daily Check level

## 2018-08-26 ENCOUNTER — Encounter: Payer: Self-pay | Admitting: Internal Medicine

## 2018-08-27 ENCOUNTER — Other Ambulatory Visit: Payer: Self-pay | Admitting: Internal Medicine

## 2018-09-14 ENCOUNTER — Other Ambulatory Visit: Payer: Self-pay | Admitting: Internal Medicine

## 2018-09-17 DIAGNOSIS — D352 Benign neoplasm of pituitary gland: Secondary | ICD-10-CM | POA: Diagnosis not present

## 2018-09-24 DIAGNOSIS — I25810 Atherosclerosis of coronary artery bypass graft(s) without angina pectoris: Secondary | ICD-10-CM | POA: Diagnosis not present

## 2018-09-24 DIAGNOSIS — D352 Benign neoplasm of pituitary gland: Secondary | ICD-10-CM | POA: Diagnosis not present

## 2018-09-24 DIAGNOSIS — Z87898 Personal history of other specified conditions: Secondary | ICD-10-CM | POA: Diagnosis not present

## 2018-09-24 DIAGNOSIS — E785 Hyperlipidemia, unspecified: Secondary | ICD-10-CM | POA: Diagnosis not present

## 2018-09-24 DIAGNOSIS — R51 Headache: Secondary | ICD-10-CM | POA: Diagnosis not present

## 2018-09-28 ENCOUNTER — Other Ambulatory Visit: Payer: Self-pay | Admitting: Internal Medicine

## 2018-10-02 ENCOUNTER — Other Ambulatory Visit: Payer: Self-pay | Admitting: Internal Medicine

## 2018-10-17 ENCOUNTER — Other Ambulatory Visit: Payer: Self-pay | Admitting: Internal Medicine

## 2018-10-28 DIAGNOSIS — L821 Other seborrheic keratosis: Secondary | ICD-10-CM | POA: Diagnosis not present

## 2018-10-28 DIAGNOSIS — Z85828 Personal history of other malignant neoplasm of skin: Secondary | ICD-10-CM | POA: Diagnosis not present

## 2018-10-28 DIAGNOSIS — D225 Melanocytic nevi of trunk: Secondary | ICD-10-CM | POA: Diagnosis not present

## 2018-11-04 ENCOUNTER — Other Ambulatory Visit: Payer: Self-pay | Admitting: Internal Medicine

## 2018-11-06 DIAGNOSIS — D352 Benign neoplasm of pituitary gland: Secondary | ICD-10-CM | POA: Diagnosis not present

## 2018-11-06 DIAGNOSIS — H2513 Age-related nuclear cataract, bilateral: Secondary | ICD-10-CM | POA: Diagnosis not present

## 2018-11-06 DIAGNOSIS — H52203 Unspecified astigmatism, bilateral: Secondary | ICD-10-CM | POA: Diagnosis not present

## 2019-01-17 NOTE — Progress Notes (Signed)
Cardiology Office Note  Date: 01/18/2019   ID: JUPITER KABIR, DOB [birth year], MRN [id number]  PCP:  [name], MD  Cardiologist:  Rozann Lesches, MD Electrophysiologist:  None   Chief Complaint  Patient presents with  . Cardiac follow-up    History of Present Illness: Patrick Harris is a 74 y.o. male last seen in December 2019.  He presents for a routine follow-up visit.  States that he has had no obvious angina symptoms with typical ADLs, stable NYHA class II dyspnea.  He has had some reflux and is also having trouble with chronic neck pain for which he was treated with a course of steroids.  He is not sure at this point whether he will need any procedures to address this.  Structural and ischemic cardiac testing from January 2020 are outlined below.  I reviewed the results today.  Plan is to continue medical therapy and observation.  He remains on aspirin, Crestor, and Diovan.  Lab work from August 2020 showed LDL 86.  Past Medical History:  Diagnosis Date  . Arthritis   . CAD (coronary artery disease)    CABG with LIMA to LAD and RIMA to RCA August 2000 - Dr. Roxan Hockey  . COPD (chronic obstructive pulmonary disease) ([place])   . Elevated glycated hemoglobin   . Essential hypertension   . GERD (gastroesophageal reflux disease)   . Heart murmur   . Hyperlipidemia   . Hypopituitarism ([place])   . Osteopenia    Dr Wilson Singer  . Pancreatitis   . Peyronie disease   . Testosterone deficiency    Dr Wilson Singer    Past Surgical History:  Procedure Laterality Date  . Bone spur  2006   5th toe bilaterally  . COLONOSCOPY  2007   negative; Dr Sharlett Iles  . CORONARY ARTERY BYPASS GRAFT  2001   2 vessels  . fracture heel  1967   Bil/ due to fall off ladder  . Fractured calcaneus  1969   bilaterally w/ ankle fusion  . HERNIA REPAIR  1984   right inguinal hernia  . MENISECTOMY  2007   R medial, Dr. Lorin Mercy  . [place]   left  . UPPER GASTROINTESTINAL ENDOSCOPY   2007   GERD, Dr Sharlett Iles  . VASECTOMY      Current Outpatient Medications  Medication Sig Dispense Refill  . acetaminophen (TYLENOL) 500 MG tablet Take 500 mg by mouth every 6 (six) hours as needed.    Marland Kitchen aspirin EC 81 MG tablet Take 1 tablet (81 mg total) by mouth at bedtime.    . bromocriptine (PARLODEL) 2.5 MG tablet Take 7.5 mg by mouth daily.     . Calcium-Magnesium-Zinc (CAL-MAG-ZINC PO) Take by mouth daily.      . Cholecalciferol (VITAMIN D3) 2000 UNITS TABS Take by mouth. 2000 iu in the am, 1000 iu in the pm    . Coenzyme Q10 (COQ10) 100 MG CAPS Take by mouth daily.      Marland Kitchen CRANBERRY PO Take by mouth. 1 tablespoon daily     . Flaxseed, Linseed, (FLAX SEED OIL PO) Take by mouth.      . Garlic 2992 MG CAPS Take 1,000 mg by mouth 2 (two) times daily.    . Iodine, Kelp, (KELP PO) Take by mouth daily.    Marland Kitchen L-Arginine 500 MG CAPS Take by mouth daily.      Marland Kitchen omeprazole (PRILOSEC) 20 MG capsule TAKE 1 CAPSULE BY MOUTH EVERY DAY  90 capsule 3  . Pomegranate Seed 80 % OIL by Does not apply route. 1 table spoon a day     . Potassium 99 MG TABS Take by mouth daily.      . Probiotic Product (PROBIOTIC DAILY PO) Take by mouth daily.    . rosuvastatin (CRESTOR) 20 MG tablet TAKE HALF A TABLET (10 MG) BY MOUTH ONCE DAILY. 45 tablet 2  . sildenafil (REVATIO) 20 MG tablet Take 20 mg by mouth daily.    . tamsulosin (FLOMAX) 0.4 MG CAPS capsule TAKE 1 CAPSULE BY MOUTH EVERY DAY 90 capsule 3  . Turmeric 500 MG CAPS Take by mouth.      . valsartan (DIOVAN) 40 MG tablet TAKE 1 TABLET BY MOUTH EVERY DAY 90 tablet 3  . vitamin C (ASCORBIC ACID) 500 MG tablet Take 500 mg by mouth 2 (two) times daily.     No current facility-administered medications for this visit.   Allergies:  Latex, Amlodipine, Hydrochlorothiazide, and Simvastatin   Social History: The patient  reports that he quit smoking about 21 years ago. He has never used smokeless tobacco. He reports that he does not drink alcohol or use drugs.    ROS:  Please see the history of present illness. Otherwise, complete review of systems is positive for none.  All other systems are reviewed and negative.   Physical Exam: VS:  BP (!) 160/75   Pulse (!) 50   Temp (!) 97.1 F (36.2 C)   Ht 6' (1.829 m)   Wt 185 lb (83.9 kg)   SpO2 100%   BMI 25.09 kg/m , BMI Body mass index is 25.09 kg/m.  Wt Readings from Last 3 Encounters:  01/18/19 185 lb (83.9 kg)  08/24/18 181 lb 9.6 oz (82.4 kg)  03/02/18 181 lb (82.1 kg)    General: Patient appears comfortable at rest. HEENT: Conjunctiva and lids normal, wearing a mask. Neck: Supple, no elevated JVP or carotid bruits, no thyromegaly. Lungs: Clear to auscultation, nonlabored breathing at rest. Cardiac: Regular rate and rhythm, no S3, 2/6 systolic murmur. Abdomen: Soft, nontender, bowel sounds present. Extremities: No pitting edema, distal pulses 2+. Skin: Warm and dry. Musculoskeletal: No kyphosis. Neuropsychiatric: Alert and oriented x3, affect grossly appropriate.  ECG:  An ECG dated 01/01/2018 was personally reviewed today and demonstrated:  Sinus bradycardia with nonspecific ST changes.  Recent Labwork: 08/24/2018: ALT 15; AST 18; BUN 15; Creatinine, Ser 1.00; Hemoglobin 14.9; Platelets 184.0; Potassium 4.2; Sodium 139     Component Value Date/Time   CHOL 146 08/24/2018 0905   TRIG 130.0 08/24/2018 0905   HDL 34.20 (L) 08/24/2018 0905   CHOLHDL 4 08/24/2018 0905   VLDL 26.0 08/24/2018 0905   LDLCALC 86 08/24/2018 0905    Other Studies Reviewed Today:  [facility] Myoview 01/15/2018:  There was no ST segment deviation noted during stress.  Defect 1: There is a medium defect of moderate severity present in the mid inferoseptal, mid inferior and apical inferior location.  Findings consistent with probable scar with some degree of soft tissue attenuation contributing to the defects.  This is a low risk study overall. No ischemic zones.  Nuclear stress EF:  48%.  Echocardiogram 01/15/2018: Study Conclusions  - Left ventricle: The cavity size was normal. Wall thickness was   increased in a pattern of mild LVH. Systolic function was normal.   The estimated ejection fraction was in the range of 55% to 60%.   Wall motion was normal; there were no  regional wall motion   abnormalities. Features are consistent with a pseudonormal left   ventricular filling pattern, with concomitant abnormal relaxation   and increased filling pressure (grade 2 diastolic dysfunction).   Doppler parameters are consistent with indeterminate ventricular   filling pressure. - Aortic valve: Mildly to moderately calcified annulus. Mildly   thickened, mildly calcified leaflets. Sclerosis without stenosis. - Left atrium: The atrium was mildly dilated. - Right ventricle: Systolic function was mildly reduced. - Systemic veins: The IVC is dilated with normal respiratory   variation. Estimated right atrial pressure is 8 mmHg.  Assessment and Plan:  1.  CAD status post CABG in 2000.  Follow-up testing from last year included a low risk Myoview and also normal LVEF at 55 to 60% by echocardiography.  Plan is to continue medical therapy and observation in the absence of progressive angina symptoms.  Continue aspirin, Crestor, and Diovan.  2.  Mixed hyperlipidemia, continues on Crestor.  LDL 86 in August 2020.  3.  Essential hypertension, systolic elevated today but he reports active toothache with plan to see dentist.  No changes in current regimen.  Medication Adjustments/Labs and Tests Ordered: Current medicines are reviewed at length with the patient today.  Concerns regarding medicines are outlined above.   Tests Ordered: No orders of the defined types were placed in this encounter.   Medication Changes: No orders of the defined types were placed in this encounter.   Disposition:  Follow up 1 year in the [place] office.  Signed, Satira Sark, MD,  [facility] 01/18/2019 8:57 AM    [facility] [place]. [address] [place], [place] [address] Phone: [phone]; Fax: [phone]

## 2019-01-18 ENCOUNTER — Other Ambulatory Visit: Payer: Self-pay

## 2019-01-18 ENCOUNTER — Ambulatory Visit: Payer: Medicare Other | Admitting: Cardiology

## 2019-01-18 ENCOUNTER — Encounter: Payer: Self-pay | Admitting: Cardiology

## 2019-01-18 VITALS — BP 160/75 | HR 50 | Temp 97.1°F | Ht 72.0 in | Wt 185.0 lb

## 2019-01-18 DIAGNOSIS — I10 Essential (primary) hypertension: Secondary | ICD-10-CM | POA: Diagnosis not present

## 2019-01-18 DIAGNOSIS — E782 Mixed hyperlipidemia: Secondary | ICD-10-CM

## 2019-01-18 DIAGNOSIS — I25119 Atherosclerotic heart disease of native coronary artery with unspecified angina pectoris: Secondary | ICD-10-CM

## 2019-01-18 NOTE — Patient Instructions (Signed)
Medication Instructions:  Your physician recommends that you continue on your current medications as directed. Please refer to the Current Medication list given to you today.  *If you need a refill on your cardiac medications before your next appointment, please call your pharmacy*  Lab Work: NONE If you have labs (blood work) drawn today and your tests are completely normal, you will receive your results only by: . MyChart Message (if you have MyChart) OR . A paper copy in the mail If you have any lab test that is abnormal or we need to change your treatment, we will call you to review the results.  Testing/Procedures: NONE  Follow-Up: At [facility], you and your health needs are our priority.  As part of our continuing mission to provide you with exceptional heart care, we have created designated Provider Care Teams.  These Care Teams include your primary Cardiologist (physician) and Advanced Practice Providers (APPs -  Physician Assistants and Nurse Practitioners) who all work together to provide you with the care you need, when you need it.  Your next appointment:   12 month(s)  The format for your next appointment:   In Person  Provider:   Samuel McDowell, MD  Other Instructions NONE     Thank you for choosing [facility] !         

## 2019-03-18 ENCOUNTER — Encounter: Payer: Self-pay | Admitting: Gastroenterology

## 2019-03-18 ENCOUNTER — Other Ambulatory Visit: Payer: Self-pay

## 2019-03-18 ENCOUNTER — Other Ambulatory Visit (INDEPENDENT_AMBULATORY_CARE_PROVIDER_SITE_OTHER): Payer: Medicare Other

## 2019-03-18 ENCOUNTER — Ambulatory Visit: Payer: Medicare Other | Admitting: Gastroenterology

## 2019-03-18 VITALS — BP 120/62 | HR 68 | Temp 98.2°F | Ht 72.0 in | Wt 185.5 lb

## 2019-03-18 DIAGNOSIS — R079 Chest pain, unspecified: Secondary | ICD-10-CM

## 2019-03-18 DIAGNOSIS — K5901 Slow transit constipation: Secondary | ICD-10-CM | POA: Diagnosis not present

## 2019-03-18 DIAGNOSIS — R1084 Generalized abdominal pain: Secondary | ICD-10-CM

## 2019-03-18 DIAGNOSIS — K219 Gastro-esophageal reflux disease without esophagitis: Secondary | ICD-10-CM

## 2019-03-18 LAB — CBC WITH DIFFERENTIAL/PLATELET
Basophils Absolute: 0.1 10*3/uL (ref 0.0–0.1)
Basophils Relative: 0.9 % (ref 0.0–3.0)
Eosinophils Absolute: 0.1 10*3/uL (ref 0.0–0.7)
Eosinophils Relative: 1.1 % (ref 0.0–5.0)
HCT: 45.9 % (ref 39.0–52.0)
Hemoglobin: 15.7 g/dL (ref 13.0–17.0)
Lymphocytes Relative: 26.8 % (ref 12.0–46.0)
Lymphs Abs: 1.7 10*3/uL (ref 0.7–4.0)
MCHC: 34.2 g/dL (ref 30.0–36.0)
MCV: 93.6 fl (ref 78.0–100.0)
Monocytes Absolute: 0.7 10*3/uL (ref 0.1–1.0)
Monocytes Relative: 10.3 % (ref 3.0–12.0)
Neutro Abs: 3.9 10*3/uL (ref 1.4–7.7)
Neutrophils Relative %: 60.9 % (ref 43.0–77.0)
Platelets: 180 10*3/uL (ref 150.0–400.0)
RBC: 4.9 Mil/uL (ref 4.22–5.81)
RDW: 13.4 % (ref 11.5–15.5)
WBC: 6.4 10*3/uL (ref 4.0–10.5)

## 2019-03-18 LAB — COMPREHENSIVE METABOLIC PANEL
ALT: 18 U/L (ref 0–53)
AST: 20 U/L (ref 0–37)
Albumin: 4.1 g/dL (ref 3.5–5.2)
Alkaline Phosphatase: 64 U/L (ref 39–117)
BUN: 17 mg/dL (ref 6–23)
CO2: 25 mEq/L (ref 19–32)
Calcium: 9.6 mg/dL (ref 8.4–10.5)
Chloride: 106 mEq/L (ref 96–112)
Creatinine, Ser: 0.86 mg/dL (ref 0.40–1.50)
GFR: 87.01 mL/min (ref >60.00)
Glucose, Bld: 107 mg/dL — ABNORMAL HIGH (ref 70–99)
Potassium: 4.1 mEq/L (ref 3.5–5.1)
Sodium: 137 mEq/L (ref 135–145)
Total Bilirubin: 0.6 mg/dL (ref 0.2–1.2)
Total Protein: 7.2 g/dL (ref 6.0–8.3)

## 2019-03-18 LAB — LIPASE: Lipase: 15 U/L (ref 11.0–59.0)

## 2019-03-18 MED ORDER — OMEPRAZOLE 40 MG PO CPDR: 40.0000 mg | DELAYED_RELEASE_CAPSULE | Freq: Every day | ORAL | 11 refills | Status: DC

## 2019-03-18 NOTE — Patient Instructions (Addendum)
Your provider has requested that you go to the basement level for lab work before leaving today. Press "B" on the elevator. The lab is located at the first door on the left as you exit the elevator.  Increase your omeprazole to 40 mg daily. A new prescription has been sent to the pharmacy.   You have been scheduled for a CT scan of the abdomen and pelvis at [facility] Radiology department.   You are scheduled on 03/30/19 at 2:00pm. You should arrive 15 minutes prior to your appointment time for registration. Please follow the written instructions below on the day of your exam:  WARNING: IF YOU ARE ALLERGIC TO IODINE/X-RAY DYE, PLEASE NOTIFY RADIOLOGY IMMEDIATELY AT [phone]! YOU WILL BE GIVEN A 13 HOUR PREMEDICATION PREP.  1) Do not eat or drink anything after 10:00am (4 hours prior to your test) 2) You have been given 2 bottles of oral contrast to drink. The solution may taste better if refrigerated, but do NOT add ice or any other liquid to this solution. Shake well before drinking.    Drink 1 bottle of contrast @ 12:00pm (2 hours prior to your exam)  Drink 1 bottle of contrast @ 1:00pm (1 hour prior to your exam)  You may take any medications as prescribed with a small amount of water, if necessary. If you take any of the following medications: METFORMIN, GLUCOPHAGE, GLUCOVANCE, AVANDAMET, RIOMET, FORTAMET, Sutter MET, JANUMET, GLUMETZA or METAGLIP, you MAY be asked to HOLD this medication 48 hours AFTER the exam.  The purpose of you drinking the oral contrast is to aid in the visualization of your intestinal tract. The contrast solution may cause some diarrhea. Depending on your individual set of symptoms, you may also receive an intravenous injection of x-ray contrast/dye.   This test typically takes 30-45 minutes to complete.  If you have any questions regarding your exam or if you need to reschedule, you may call the CT department at [phone] between the hours of 8:00  am and 5:00 pm, Monday-Friday.  __________________________________________________________________  Patrick Harris have been scheduled for an endoscopy. Please follow written instructions given to you at your visit today. If you use inhalers (even only as needed), please bring them with you on the day of your procedure.  Normal BMI (Body Mass Index- based on height and weight) is between 23 and 30. Your BMI today is Body mass index is 25.16 kg/m. Marland Kitchen Please consider follow up  regarding your BMI with your Primary Care Provider.  Thank you for choosing me and [facility].  Pricilla Riffle. Dagoberto Ligas., MD., [facility]

## 2019-03-18 NOTE — Progress Notes (Signed)
     History of Present Illness: This is a 74 year old male complaining of chest pain, abdominal pain, nausea, gas, constipation.  He relates his reflux worsened when he began sleeping flat instead of with an elevated HOB.  He has not been able to get his reflux symptoms back under control.  He also developed frequent generalized abdominal pain associated with gas and constipation.  He states it is uncomfortable when he bends forward.  When he is fully upright or fully flat he does not notice the discomfort. Denies weight loss, diarrhea, change in stool caliber, melena, hematochezia, nausea, vomiting, dysphagia.   Current Medications, Allergies, Past Medical History, Past Surgical History, Family History and Social History were reviewed in [employer] record.   Physical Exam: General: Well developed, well nourished, no acute distress Head: Normocephalic and atraumatic Eyes:  sclerae anicteric, EOMI Ears: Normal auditory acuity Mouth: Not examined, mask on during Covid-19 pandemic Lungs: Clear throughout to auscultation Heart: Regular rate and rhythm; no murmurs, rubs or bruits Abdomen: Soft, mild diffuse tenderness and non distended. No masses, hepatosplenomegaly or hernias noted. Normal Bowel sounds Rectal: Not done Musculoskeletal: Symmetrical with no gross deformities  Pulses:  Normal pulses noted Extremities: No clubbing, cyanosis, edema or deformities noted Neurological: Alert oriented x 4, grossly nonfocal Psychological:  Alert and cooperative. Normal mood and affect   Assessment and Recommendations:  1. Chest pain, abdominal pain, nausea, GERD, constipation. CMP, CBC, lipase.  Increase omeprazole to 40 mg daily.  Resume HOB elevation.  Follow antireflux measures.  Begin Metamucil once daily and increase daily fluid intake.  If this does not provide a complete bowel movement daily begin MiraLAX daily.  Schedule CT AP.  Schedule EGD. The risks (including  bleeding, perforation, infection, missed lesions, medication reactions and possible hospitalization or surgery if complications occur), benefits, and alternatives to endoscopy with possible biopsy and possible dilation were discussed with the patient and they consent to proceed.   2.  Personal history of adenomatous colon polyps.  A 5-year interval surveillance colonoscopy is recommended in August 2023.  3.  History of pancreatitis possibly secondary to HCTZ.

## 2019-03-25 DIAGNOSIS — Z5181 Encounter for therapeutic drug level monitoring: Secondary | ICD-10-CM | POA: Diagnosis not present

## 2019-03-25 DIAGNOSIS — D352 Benign neoplasm of pituitary gland: Secondary | ICD-10-CM | POA: Diagnosis not present

## 2019-03-25 DIAGNOSIS — E230 Hypopituitarism: Secondary | ICD-10-CM | POA: Diagnosis not present

## 2019-03-26 ENCOUNTER — Encounter: Payer: Self-pay | Admitting: Gastroenterology

## 2019-03-26 ENCOUNTER — Other Ambulatory Visit: Payer: Self-pay | Admitting: Internal Medicine

## 2019-03-26 DIAGNOSIS — D352 Benign neoplasm of pituitary gland: Secondary | ICD-10-CM

## 2019-03-26 DIAGNOSIS — E230 Hypopituitarism: Secondary | ICD-10-CM | POA: Diagnosis not present

## 2019-03-29 ENCOUNTER — Other Ambulatory Visit: Payer: Self-pay

## 2019-03-29 ENCOUNTER — Ambulatory Visit (AMBULATORY_SURGERY_CENTER): Payer: Medicare Other | Admitting: Gastroenterology

## 2019-03-29 ENCOUNTER — Encounter: Payer: Self-pay | Admitting: Gastroenterology

## 2019-03-29 VITALS — BP 131/60 | HR 42 | Temp 97.7°F | Resp 19 | Ht 72.0 in | Wt 185.0 lb

## 2019-03-29 DIAGNOSIS — K2950 Unspecified chronic gastritis without bleeding: Secondary | ICD-10-CM

## 2019-03-29 DIAGNOSIS — K219 Gastro-esophageal reflux disease without esophagitis: Secondary | ICD-10-CM

## 2019-03-29 DIAGNOSIS — R1084 Generalized abdominal pain: Secondary | ICD-10-CM

## 2019-03-29 DIAGNOSIS — K449 Diaphragmatic hernia without obstruction or gangrene: Secondary | ICD-10-CM | POA: Diagnosis not present

## 2019-03-29 DIAGNOSIS — R079 Chest pain, unspecified: Secondary | ICD-10-CM

## 2019-03-29 MED ORDER — SODIUM CHLORIDE 0.9 % IV SOLN: 500.0000 mL | Freq: Once | INTRAVENOUS | Status: DC

## 2019-03-29 NOTE — Patient Instructions (Signed)
Impression/Recommendations:  Hiatal hernia handout given to patient.  Resume previous diet. Follow antireflux measure. Continue present medications. Await pathology results. Proceed with CT AP as scheduled.  YOU HAD AN ENDOSCOPIC PROCEDURE TODAY AT [facility]:   Refer to the procedure report that was given to you for any specific questions about what was found during the examination.  If the procedure report does not answer your questions, please call your gastroenterologist to clarify.  If you requested that your care partner not be given the details of your procedure findings, then the procedure report has been included in a sealed envelope for you to review at your convenience later.  YOU SHOULD EXPECT: Some feelings of bloating in the abdomen. Passage of more gas than usual.  Walking can help get rid of the air that was put into your GI tract during the procedure and reduce the bloating. If you had a lower endoscopy (such as a colonoscopy or flexible sigmoidoscopy) you may notice spotting of blood in your stool or on the toilet paper. If you underwent a bowel prep for your procedure, you may not have a normal bowel movement for a few days.  Please Note:  You might notice some irritation and congestion in your nose or some drainage.  This is from the oxygen used during your procedure.  There is no need for concern and it should clear up in a day or so.  SYMPTOMS TO REPORT IMMEDIATELY:  Following upper endoscopy (EGD)  Vomiting of blood or coffee ground material  New chest pain or pain under the shoulder blades  Painful or persistently difficult swallowing  New shortness of breath  Fever of 100F or higher  Black, tarry-looking stools  For urgent or emergent issues, a gastroenterologist can be reached at any hour by calling [phone]. Do not use MyChart messaging for urgent concerns.    DIET:  We do recommend a small meal at first, but then you may proceed to  your regular diet.  Drink plenty of fluids but you should avoid alcoholic beverages for 24 hours.  ACTIVITY:  You should plan to take it easy for the rest of today and you should NOT DRIVE or use heavy machinery until tomorrow (because of the sedation medicines used during the test).    FOLLOW UP: Our staff will call the number listed on your records 48-72 hours following your procedure to check on you and address any questions or concerns that you may have regarding the information given to you following your procedure. If we do not reach you, we will leave a message.  We will attempt to reach you two times.  During this call, we will ask if you have developed any symptoms of COVID 19. If you develop any symptoms (ie: fever, flu-like symptoms, shortness of breath, cough etc.) before then, please call [phone].  If you test positive for Covid 19 in the 2 weeks post procedure, please call and report this information to [place].    If any biopsies were taken you will be contacted by phone or by letter within the next 1-3 weeks.  Please call us at [phone] if you have not heard about the biopsies in 3 weeks.    SIGNATURES/CONFIDENTIALITY: You and/or your care partner have signed paperwork which will be entered into your electronic medical record.  These signatures attest to the fact that that the information above on your After Visit Summary has been reviewed and is understood.  Full responsibility  of the confidentiality of this discharge information lies with you and/or your care-partner.

## 2019-03-29 NOTE — Progress Notes (Signed)
Temp check by:LC Vital check by:KA  The medical and surgical history was reviewed and verified with the patient. 

## 2019-03-29 NOTE — Op Note (Signed)
[facility] Patient Name: Patrick Harris Procedure Date: 03/29/2019 10:21 AM MRN: [id number] Endoscopist: Ladene Artist , MD Age: 74 Referring MD:  Date of Birth: December 01, 1945 Gender: Male Account #: [id number] Procedure:                Upper GI endoscopy Indications:              Generalized abdominal pain, Gastroesophageal reflux                            disease, Unexplained chest pain Medicines:                Monitored Anesthesia Care Procedure:                Pre-Anesthesia Assessment:                           - Prior to the procedure, a History and Physical                            was performed, and patient medications and                            allergies were reviewed. The patient's tolerance of                            previous anesthesia was also reviewed. The risks                            and benefits of the procedure and the sedation                            options and risks were discussed with the patient.                            All questions were answered, and informed consent                            was obtained. Prior Anticoagulants: The patient has                            taken no previous anticoagulant or antiplatelet                            agents. ASA Grade Assessment: II - A patient with                            mild systemic disease. After reviewing the risks                            and benefits, the patient was deemed in                            satisfactory condition to undergo the procedure.  After obtaining informed consent, the endoscope was                            passed under direct vision. Throughout the                            procedure, the patient's blood pressure, pulse, and                            oxygen saturations were monitored continuously. The                            Endoscope was introduced through the mouth, and                            advanced to the second  part of duodenum. The upper                            GI endoscopy was accomplished without difficulty.                            The patient tolerated the procedure well. Scope In: Scope Out: Findings:                 The examined esophagus was normal.                           Patchy mildly erythematous mucosa without bleeding                            was found in the gastric body and in the gastric                            antrum. Biopsies were taken with a cold forceps for                            histology.                           A small hiatal hernia was present.                           The exam of the stomach was otherwise normal.                           The duodenal bulb and second portion of the                            duodenum were normal. Complications:            No immediate complications. Estimated Blood Loss:     Estimated blood loss was minimal. Impression:               - Normal esophagus.                           -  Erythematous mucosa in the gastric body and                            antrum. Biopsied.                           - Small hiatal hernia.                           - Normal duodenal bulb and second portion of the                            duodenum. Recommendation:           - Patient has a contact number available for                            emergencies. The signs and symptoms of potential                            delayed complications were discussed with the                            patient. Return to normal activities tomorrow.                            Written discharge instructions were provided to the                            patient.                           - Resume previous diet.                           - Follow antireflux measures.                           - Continue present medications.                           - Await pathology results.                           - Proceed with CT AP as scheduled. Ladene Artist, MD 03/29/2019 10:36:42 AM This report has been signed electronically.

## 2019-03-29 NOTE — Progress Notes (Signed)
A and O x3. Report to RN. Tolerated MAC anesthesia well.Teeth unchanged after procedure.

## 2019-03-29 NOTE — Progress Notes (Signed)
Called to room to assist during endoscopic procedure.  Patient ID and intended procedure confirmed with present staff. Received instructions for my participation in the procedure from the performing physician.  

## 2019-03-30 ENCOUNTER — Ambulatory Visit (HOSPITAL_COMMUNITY)
Admission: RE | Admit: 2019-03-30 | Discharge: 2019-03-30 | Disposition: A | Discharge status: Home / Self Care | Payer: Medicare Other | Source: Ambulatory Visit | Attending: Gastroenterology | Admitting: Gastroenterology

## 2019-03-30 DIAGNOSIS — R079 Chest pain, unspecified: Secondary | ICD-10-CM

## 2019-03-30 DIAGNOSIS — K5901 Slow transit constipation: Secondary | ICD-10-CM | POA: Diagnosis not present

## 2019-03-30 DIAGNOSIS — R1084 Generalized abdominal pain: Secondary | ICD-10-CM | POA: Diagnosis not present

## 2019-03-30 DIAGNOSIS — R109 Unspecified abdominal pain: Secondary | ICD-10-CM | POA: Diagnosis not present

## 2019-03-30 MED ORDER — IOHEXOL 300 MG/ML  SOLN
100.0000 mL | Freq: Once | INTRAMUSCULAR | Status: AC | PRN
  Administered 2019-03-30: 100 mL via INTRAVENOUS

## 2019-03-30 MED ORDER — SODIUM CHLORIDE (PF) 0.9 % IJ SOLN
INTRAMUSCULAR | Status: AC
  Filled 2019-03-30: qty 50

## 2019-03-31 ENCOUNTER — Telehealth: Payer: Self-pay | Admitting: *Deleted

## 2019-03-31 NOTE — Telephone Encounter (Signed)
  Follow up Call-  Call back number 03/29/2019 08/27/2016  Post procedure Call Back phone  # [phone]  Permission to leave phone message Yes Yes  Some recent data might be hidden     Patient questions:  Do you have a fever, pain , or abdominal swelling? No. Pain Score  0 *  Have you tolerated food without any problems? Yes.    Have you been able to return to your normal activities? Yes.    Do you have any questions about your discharge instructions: Diet   No. Medications  No. Follow up visit  No.  Do you have questions or concerns about your Care? No.  Actions: * If pain score is 4 or above: No action needed, pain <4.   1. Have you developed a fever since your procedure? no  2.   Have you had an respiratory symptoms (SOB or cough) since your procedure? no  3.   Have you tested positive for COVID 19 since your procedure no  4.   Have you had any family members/close contacts diagnosed with the COVID 19 since your procedure?  no   If yes to any of these questions please route to Joylene John, RN and Erenest Rasher, RN

## 2019-04-01 ENCOUNTER — Other Ambulatory Visit: Payer: Self-pay

## 2019-04-01 DIAGNOSIS — R933 Abnormal findings on diagnostic imaging of other parts of digestive tract: Secondary | ICD-10-CM

## 2019-04-01 NOTE — Progress Notes (Signed)
CT enterography scheduled for 05/05/19 [facility] 8:00 arrival for 9:00 and he is asked to be NPO for 4 hours

## 2019-04-07 ENCOUNTER — Encounter: Payer: Self-pay | Admitting: Gastroenterology

## 2019-04-13 ENCOUNTER — Telehealth: Payer: Self-pay | Admitting: Gastroenterology

## 2019-04-13 NOTE — Telephone Encounter (Signed)
I reviewed the letter from pathology with the patient.  All questions answered

## 2019-04-24 ENCOUNTER — Other Ambulatory Visit: Payer: Self-pay

## 2019-04-24 ENCOUNTER — Ambulatory Visit
Admission: RE | Admit: 2019-04-24 | Discharge: 2019-04-24 | Disposition: A | Discharge status: Home / Self Care | Payer: Medicare Other | Source: Ambulatory Visit | Attending: Internal Medicine | Admitting: Internal Medicine

## 2019-04-24 DIAGNOSIS — D352 Benign neoplasm of pituitary gland: Secondary | ICD-10-CM

## 2019-04-24 MED ORDER — GADOBENATE DIMEGLUMINE 529 MG/ML IV SOLN
12.0000 mL | Freq: Once | INTRAVENOUS | Status: AC | PRN
  Administered 2019-04-24: 12 mL via INTRAVENOUS

## 2019-05-04 ENCOUNTER — Telehealth: Payer: Self-pay | Admitting: Gastroenterology

## 2019-05-04 ENCOUNTER — Other Ambulatory Visit: Payer: Self-pay

## 2019-05-04 DIAGNOSIS — E230 Hypopituitarism: Secondary | ICD-10-CM | POA: Diagnosis not present

## 2019-05-04 DIAGNOSIS — R1084 Generalized abdominal pain: Secondary | ICD-10-CM

## 2019-05-04 DIAGNOSIS — D352 Benign neoplasm of pituitary gland: Secondary | ICD-10-CM | POA: Diagnosis not present

## 2019-05-04 NOTE — Telephone Encounter (Signed)
At [name] she's scheduled for tomorrow [phone]

## 2019-05-04 NOTE — Telephone Encounter (Signed)
What radiology, what is the phone number?

## 2019-05-04 NOTE — Telephone Encounter (Signed)
Radiology called for STAT order creatinine

## 2019-05-04 NOTE — Telephone Encounter (Signed)
Order entered into Epic for I stat creat.

## 2019-05-05 ENCOUNTER — Encounter (HOSPITAL_COMMUNITY): Payer: Self-pay

## 2019-05-05 ENCOUNTER — Other Ambulatory Visit: Payer: Self-pay

## 2019-05-05 ENCOUNTER — Ambulatory Visit (HOSPITAL_COMMUNITY)
Admission: RE | Admit: 2019-05-05 | Discharge: 2019-05-05 | Disposition: A | Discharge status: Home / Self Care | Payer: Medicare Other | Source: Ambulatory Visit | Attending: Gastroenterology | Admitting: Gastroenterology

## 2019-05-05 DIAGNOSIS — R1084 Generalized abdominal pain: Secondary | ICD-10-CM | POA: Diagnosis not present

## 2019-05-05 DIAGNOSIS — R933 Abnormal findings on diagnostic imaging of other parts of digestive tract: Secondary | ICD-10-CM | POA: Diagnosis not present

## 2019-05-05 LAB — POCT I-STAT CREATININE: Creatinine, Ser: 0.9 mg/dL (ref 0.61–1.24)

## 2019-05-05 MED ORDER — BARIUM SULFATE 0.1 % PO SUSP
ORAL | Status: AC
  Filled 2019-05-05: qty 3

## 2019-05-05 MED ORDER — IOHEXOL 300 MG/ML  SOLN
100.0000 mL | Freq: Once | INTRAMUSCULAR | Status: AC | PRN
  Administered 2019-05-05: 100 mL via INTRAVENOUS

## 2019-05-05 MED ORDER — SODIUM CHLORIDE (PF) 0.9 % IJ SOLN
INTRAMUSCULAR | Status: AC
  Filled 2019-05-05: qty 50

## 2019-05-26 DIAGNOSIS — E221 Hyperprolactinemia: Secondary | ICD-10-CM | POA: Diagnosis not present

## 2019-05-26 DIAGNOSIS — E230 Hypopituitarism: Secondary | ICD-10-CM | POA: Diagnosis not present

## 2019-05-26 DIAGNOSIS — Z5181 Encounter for therapeutic drug level monitoring: Secondary | ICD-10-CM | POA: Diagnosis not present

## 2019-05-26 DIAGNOSIS — D352 Benign neoplasm of pituitary gland: Secondary | ICD-10-CM | POA: Diagnosis not present

## 2019-06-03 ENCOUNTER — Other Ambulatory Visit: Payer: Self-pay | Admitting: Internal Medicine

## 2019-06-23 DIAGNOSIS — D352 Benign neoplasm of pituitary gland: Secondary | ICD-10-CM | POA: Diagnosis not present

## 2019-06-23 DIAGNOSIS — Z5181 Encounter for therapeutic drug level monitoring: Secondary | ICD-10-CM | POA: Diagnosis not present

## 2019-06-23 DIAGNOSIS — E221 Hyperprolactinemia: Secondary | ICD-10-CM | POA: Diagnosis not present

## 2019-08-24 ENCOUNTER — Encounter: Payer: Self-pay | Admitting: Internal Medicine

## 2019-08-24 DIAGNOSIS — E230 Hypopituitarism: Secondary | ICD-10-CM | POA: Diagnosis not present

## 2019-08-24 DIAGNOSIS — D352 Benign neoplasm of pituitary gland: Secondary | ICD-10-CM | POA: Diagnosis not present

## 2019-08-24 NOTE — Patient Instructions (Addendum)
Bp goal < 140/90  Blood work was ordered.    All other Health Maintenance issues reviewed.   All recommended immunizations and age-appropriate screenings are up-to-date or discussed.  No immunization administered today.   Medications reviewed and updated.  Changes include :  Stop valsartan  - start lisinopril.     Your prescription(s) have been submitted to your pharmacy. Please take as directed and contact our office if you believe you are having problem(s) with the medication(s).    Please followup in 1 year    Health Maintenance, Male Adopting a healthy lifestyle and getting preventive care are important in promoting health and wellness. Ask your health care provider about:  The right schedule for you to have regular tests and exams.  Things you can do on your own to prevent diseases and keep yourself healthy. What should I know about diet, weight, and exercise? Eat a healthy diet   Eat a diet that includes plenty of vegetables, fruits, low-fat dairy products, and lean protein.  Do not eat a lot of foods that are high in solid fats, added sugars, or sodium. Maintain a healthy weight Body mass index (BMI) is a measurement that can be used to identify possible weight problems. It estimates body fat based on height and weight. Your health care provider can help determine your BMI and help you achieve or maintain a healthy weight. Get regular exercise Get regular exercise. This is one of the most important things you can do for your health. Most adults should:  Exercise for at least 150 minutes each week. The exercise should increase your heart rate and make you sweat (moderate-intensity exercise).  Do strengthening exercises at least twice a week. This is in addition to the moderate-intensity exercise.  Spend less time sitting. Even light physical activity can be beneficial. Watch cholesterol and blood lipids Have your blood tested for lipids and cholesterol at 74 years of  age, then have this test every 5 years. You may need to have your cholesterol levels checked more often if:  Your lipid or cholesterol levels are high.  You are older than 74 years of age.  You are at high risk for heart disease. What should I know about cancer screening? Many types of cancers can be detected early and may often be prevented. Depending on your health history and family history, you may need to have cancer screening at various ages. This may include screening for:  Colorectal cancer.  Prostate cancer.  Skin cancer.  Lung cancer. What should I know about heart disease, diabetes, and high blood pressure? Blood pressure and heart disease  High blood pressure causes heart disease and increases the risk of stroke. This is more likely to develop in people who have high blood pressure readings, are of African descent, or are overweight.  Talk with your health care provider about your target blood pressure readings.  Have your blood pressure checked: ? Every 3-5 years if you are 49-72 years of age. ? Every year if you are 55 years old or older.  If you are between the ages of 60 and 70 and are a current or former smoker, ask your health care provider if you should have a one-time screening for abdominal aortic aneurysm (AAA). Diabetes Have regular diabetes screenings. This checks your fasting blood sugar level. Have the screening done:  Once every three years after age 76 if you are at a normal weight and have a low risk for diabetes.  More often  and at a younger age if you are overweight or have a high risk for diabetes. What should I know about preventing infection? Hepatitis B If you have a higher risk for hepatitis B, you should be screened for this virus. Talk with your health care provider to find out if you are at risk for hepatitis B infection. Hepatitis C Blood testing is recommended for:  Everyone born from [birth year] through 1965.  Anyone with known risk  factors for hepatitis C. Sexually transmitted infections (STIs)  You should be screened each year for STIs, including gonorrhea and chlamydia, if: ? You are sexually active and are younger than 74 years of age. ? You are older than 74 years of age and your health care provider tells you that you are at risk for this type of infection. ? Your sexual activity has changed since you were last screened, and you are at increased risk for chlamydia or gonorrhea. Ask your health care provider if you are at risk.  Ask your health care provider about whether you are at high risk for HIV. Your health care provider may recommend a prescription medicine to help prevent HIV infection. If you choose to take medicine to prevent HIV, you should first get tested for HIV. You should then be tested every 3 months for as long as you are taking the medicine. Follow these instructions at home: Lifestyle  Do not use any products that contain nicotine or tobacco, such as cigarettes, e-cigarettes, and chewing tobacco. If you need help quitting, ask your health care provider.  Do not use street drugs.  Do not share needles.  Ask your health care provider for help if you need support or information about quitting drugs. Alcohol use  Do not drink alcohol if your health care provider tells you not to drink.  If you drink alcohol: ? Limit how much you have to 0-2 drinks a day. ? Be aware of how much alcohol is in your drink. In the [place] one drink equals one 12 oz bottle of beer (355 mL), one 5 oz glass of wine (148 mL), or one 1 oz glass of hard liquor (44 mL). General instructions  Schedule regular health, dental, and eye exams.  Stay current with your vaccines.  Tell your health care provider if: ? You often feel depressed. ? You have ever been abused or do not feel safe at home. Summary  Adopting a healthy lifestyle and getting preventive care are important in promoting health and wellness.  Follow your  health care provider's instructions about healthy diet, exercising, and getting tested or screened for diseases.  Follow your health care provider's instructions on monitoring your cholesterol and blood pressure. This information is not intended to replace advice given to you by your health care provider. Make sure you discuss any questions you have with your health care provider. Document Revised: 12/17/2017 Document Reviewed: 12/17/2017 Elsevier Patient Education  2020 [employer].

## 2019-08-24 NOTE — Progress Notes (Signed)
Subjective:    Patient ID: Patrick Harris, male    DOB: 05/06/1945, 74 y.o.   MRN: [id number]  HPI He is here for a physical exam.   He can eat hot peppers and it does not affect his stomach - it makes it better.  He has nausea and pain in his upper abdomen.  It is constant.  He vomited the other day.  The hot pepper and tylenol help the stomach pain.  When he first wakes up the pain is better and it seems to get worse the more he moves around and bends.  He is concerned the valsartan is causing his abdominal pain and leg pain when he walks.  He does have some left lower leg pain that is radiculopathy.  He did not ask the back doctor if the discomfort he feels in his legs with walking is related to his back the last time he saw him.  Medications and allergies reviewed with patient and updated if appropriate.  Patient Active Problem List   Diagnosis Date Noted  . Neurogenic claudication 08/24/2018  . Neck pain 03/02/2018  . Neuralgia 03/02/2018  . Prediabetes 08/22/2017  . Umbilical hernia 73/22/0254  . Varicose veins of both lower extremities 05/22/2016  . Vitamin D deficiency 08/21/2015  . Hypertension 01/24/2015  . Family history of prostate cancer 08/19/2014  . Osteopenia 08/07/2009  . Hyperlipidemia 08/12/2007  . PITUITARY INSUFFICIENCY 08/05/2007  . Testosterone deficiency 08/05/2007  . Coronary atherosclerosis 08/05/2007  . GERD 08/05/2007  . BPH without obstruction/lower urinary tract symptoms 08/05/2007    Current Outpatient Medications on File Prior to Visit  Medication Sig Dispense Refill  . acetaminophen (TYLENOL) 500 MG tablet Take 500 mg by mouth every 6 (six) hours as needed.    Marland Kitchen aspirin EC 81 MG tablet Take 1 tablet (81 mg total) by mouth at bedtime.    . bromocriptine (PARLODEL) 2.5 MG tablet Take 7.5 mg by mouth daily.  (Patient not taking: Reported on 08/25/2019)    . bromocriptine (PARLODEL) 5 MG capsule Take 10 mg by mouth daily.    .  Calcium-Magnesium-Zinc (CAL-MAG-ZINC PO) Take by mouth daily.      . Cholecalciferol (VITAMIN D3) 2000 UNITS TABS Take by mouth. 2000 iu in the am, 1000 iu in the pm    . Coenzyme Q10 (COQ10) 100 MG CAPS Take by mouth daily.      Marland Kitchen CRANBERRY PO Take by mouth. 1 tablespoon daily     . Flaxseed, Linseed, (FLAX SEED OIL PO) Take by mouth.      . Garlic 2706 MG CAPS Take 1,000 mg by mouth 2 (two) times daily.    . Iodine, Kelp, (KELP PO) Take by mouth daily.    Marland Kitchen L-Arginine 500 MG CAPS Take by mouth daily.      Marland Kitchen omeprazole (PRILOSEC) 40 MG capsule Take 1 capsule (40 mg total) by mouth daily. 30 capsule 11  . Pomegranate Seed 80 % OIL by Does not apply route. 1 table spoon a day     . Potassium 99 MG TABS Take by mouth daily.      . Probiotic Product (PROBIOTIC DAILY PO) Take by mouth daily.    . rosuvastatin (CRESTOR) 20 MG tablet TAKE HALF A TABLET (10 MG) BY MOUTH ONCE DAILY. 45 tablet 2  . tamsulosin (FLOMAX) 0.4 MG CAPS capsule TAKE 1 CAPSULE BY MOUTH EVERY DAY 90 capsule 3  . Testosterone 20.25 MG/ACT (1.62%) GEL SMARTSIG:2 Pump Topical Daily    .  Turmeric 500 MG CAPS Take by mouth.      . valsartan (DIOVAN) 40 MG tablet TAKE 1 TABLET BY MOUTH EVERY DAY 90 tablet 3  . vitamin C (ASCORBIC ACID) 500 MG tablet Take 500 mg by mouth 2 (two) times daily.     No current facility-administered medications on file prior to visit.    Past Medical History:  Diagnosis Date  . Arthritis   . CAD (coronary artery disease)    CABG with LIMA to LAD and RIMA to RCA August 2000 - Dr. Roxan Hockey  . Cancer ([place])    basal cell  . CHF (congestive heart failure) ([place])   . COPD (chronic obstructive pulmonary disease) ([place])   . Elevated glycated hemoglobin   . Essential hypertension   . GERD (gastroesophageal reflux disease)   . Heart murmur   . Hyperlipidemia   . Hypopituitarism ([place])   . Osteopenia    Dr Wilson Singer  . Pancreatitis   . Peyronie disease   . Testosterone deficiency    Dr Wilson Singer    Past  Surgical History:  Procedure Laterality Date  . Bone spur  2006   5th toe bilaterally  . COLONOSCOPY  2007   negative; Dr Sharlett Iles  . CORONARY ARTERY BYPASS GRAFT  2001   2 vessels  . fracture heel  1967   Bil/ due to fall off ladder  . Fractured calcaneus  1969   bilaterally w/ ankle fusion  . HERNIA REPAIR  1984   right inguinal hernia  . KNEE SURGERY Left   . MENISECTOMY  2007   R medial, Dr. Lorin Mercy  . [place]   left  . UPPER GASTROINTESTINAL ENDOSCOPY  2007   GERD, Dr Sharlett Iles  . VASECTOMY      Social History   Socioeconomic History  . Marital status: Married    Spouse name: Not on file  . Number of children: 1  . Years of education: Not on file  . Highest education level: Not on file  Occupational History  . Occupation: retired  Tobacco Use  . Smoking status: Former Smoker    Quit date: 01/07/1998    Years since quitting: 21.6  . Smokeless tobacco: Never Used  . Tobacco comment: smoked 1963- 2000, up to 1 ppd  Vaping Use  . Vaping Use: Never used  Substance and Sexual Activity  . Alcohol use: No  . Drug use: No  . Sexual activity: Yes  Other Topics Concern  . Not on file  Social History Narrative  . Not on file   Social Determinants of Health   Financial Resource Strain:   . Difficulty of Paying Living Expenses:   Food Insecurity:   . Worried About Charity fundraiser in the Last Year:   . Arboriculturist in the Last Year:   Transportation Needs:   . Film/video editor (Medical):   Marland Kitchen Lack of Transportation (Non-Medical):   Physical Activity:   . Days of Exercise per Week:   . Minutes of Exercise per Session:   Stress:   . Feeling of Stress :   Social Connections:   . Frequency of Communication with Friends and Family:   . Frequency of Social Gatherings with Friends and Family:   . Attends Religious Services:   . Active Member of Clubs or Organizations:   . Attends Archivist Meetings:   Marland Kitchen Marital Status:      Family History  Problem Relation Age of  Onset  . Heart attack Father 77  . Diabetes Mother   . Stroke Mother 36  . Hypertension Mother   . Leukemia Sister   . Colon cancer Paternal Uncle   . Aortic aneurysm Brother        abdominal  . Heart disease Brother   . Atrial fibrillation Brother   . Cancer Maternal Uncle         X3:prostate , renal, bone   . Parkinson's disease Sister   . Stroke Sister   . Stroke Sister   . Stroke Sister   . Atrial fibrillation Sister   . Esophageal cancer Neg Hx   . Rectal cancer Neg Hx   . Stomach cancer Neg Hx     Review of Systems  Constitutional: Negative for chills, fatigue and fever.  Eyes: Negative for visual disturbance.  Respiratory: Negative for cough, shortness of breath and wheezing.   Cardiovascular: Negative for chest pain, palpitations and leg swelling.  Gastrointestinal: Positive for abdominal pain (epigastric radiates to umbilicus), nausea and vomiting (once). Negative for blood in stool, constipation and diarrhea.       No gerd  Genitourinary: Negative for difficulty urinating, dysuria and hematuria.  Musculoskeletal: Positive for arthralgias, back pain (right lower back) and neck pain.       Leg pain - pins and needles with sitting - worse with walking, fatigue in legs when walking  Skin: Positive for color change. Negative for rash.  Neurological: Negative for dizziness, light-headedness and headaches.  Psychiatric/Behavioral: Negative for dysphoric mood. The patient is not nervous/anxious.        Objective:   Vitals:   08/25/19 0758  BP: 126/76  Pulse: (!) 55  Temp: 97.9 F (36.6 C)  SpO2: 94%   Filed Weights   08/25/19 0758  Weight: 189 lb (85.7 kg)   Body mass index is 25.63 kg/m.  BP Readings from Last 3 Encounters:  08/25/19 126/76  03/29/19 131/60  03/18/19 120/62    Wt Readings from Last 3 Encounters:  08/25/19 189 lb (85.7 kg)  03/29/19 185 lb (83.9 kg)  03/18/19 185 lb 8 oz (84.1 kg)      Physical Exam Constitutional: He appears well-developed and well-nourished. No distress.  HENT:  Head: Normocephalic and atraumatic.  Right Ear: External ear normal.  Left Ear: External ear normal.  Mouth/Throat: Oropharynx is clear and moist.  Normal ear canals and TM b/l  Eyes: Conjunctivae and EOM are normal.  Neck: Neck supple. No tracheal deviation present. No thyromegaly present.  No carotid bruit  Cardiovascular: Normal rate, regular rhythm, normal heart sounds and intact distal pulses.   No murmur heard. Pulmonary/Chest: Effort normal and breath sounds normal. No respiratory distress. He has no wheezes. He has no rales.  Abdominal: Soft. He exhibits no distension. There is minimal tenderness just above umilicus, no obvious hernia, no other tendernss Genitourinary: deferred  Musculoskeletal: He exhibits no edema.  Lymphadenopathy:   He has no cervical adenopathy.  Skin: Skin is warm and dry. He is not diaphoretic.  Psychiatric: He has a normal mood and affect. His behavior is normal.         Assessment & Plan:   Physical exam: Screening blood work  ordered Immunizations  had Covid,  Discussed flu, shingrix Colonoscopy   Up to date  Eye exams   Up to date  Exercise   Yard work  [employer]  Weight is good Substance abuse   none  See Problem List for Assessment and  Plan of chronic medical problems.   This visit occurred during the SARS-CoV-2 public health emergency.  Safety protocols were in place, including screening questions prior to the visit, additional usage of staff PPE, and extensive cleaning of exam room while observing appropriate contact time as indicated for disinfecting solutions.

## 2019-08-25 ENCOUNTER — Ambulatory Visit (INDEPENDENT_AMBULATORY_CARE_PROVIDER_SITE_OTHER): Payer: Medicare Other | Admitting: Internal Medicine

## 2019-08-25 ENCOUNTER — Other Ambulatory Visit: Payer: Self-pay

## 2019-08-25 VITALS — BP 126/76 | HR 55 | Temp 97.9°F | Ht 72.0 in | Wt 189.0 lb

## 2019-08-25 DIAGNOSIS — Z0000 Encounter for general adult medical examination without abnormal findings: Secondary | ICD-10-CM | POA: Diagnosis not present

## 2019-08-25 DIAGNOSIS — E559 Vitamin D deficiency, unspecified: Secondary | ICD-10-CM | POA: Diagnosis not present

## 2019-08-25 DIAGNOSIS — E782 Mixed hyperlipidemia: Secondary | ICD-10-CM | POA: Diagnosis not present

## 2019-08-25 DIAGNOSIS — I10 Essential (primary) hypertension: Secondary | ICD-10-CM

## 2019-08-25 DIAGNOSIS — R7303 Prediabetes: Secondary | ICD-10-CM | POA: Diagnosis not present

## 2019-08-25 DIAGNOSIS — K219 Gastro-esophageal reflux disease without esophagitis: Secondary | ICD-10-CM

## 2019-08-25 DIAGNOSIS — R1033 Periumbilical pain: Secondary | ICD-10-CM

## 2019-08-25 DIAGNOSIS — K4090 Unilateral inguinal hernia, without obstruction or gangrene, not specified as recurrent: Secondary | ICD-10-CM | POA: Insufficient documentation

## 2019-08-25 DIAGNOSIS — R202 Paresthesia of skin: Secondary | ICD-10-CM

## 2019-08-25 DIAGNOSIS — N400 Enlarged prostate without lower urinary tract symptoms: Secondary | ICD-10-CM

## 2019-08-25 DIAGNOSIS — G8929 Other chronic pain: Secondary | ICD-10-CM | POA: Insufficient documentation

## 2019-08-25 DIAGNOSIS — Z125 Encounter for screening for malignant neoplasm of prostate: Secondary | ICD-10-CM

## 2019-08-25 DIAGNOSIS — I700 Atherosclerosis of aorta: Secondary | ICD-10-CM

## 2019-08-25 LAB — PSA: PSA: 1.5 ng/mL (ref <=4.0)

## 2019-08-25 MED ORDER — ROSUVASTATIN CALCIUM 20 MG PO TABS: ORAL_TABLET | ORAL | 3 refills | Status: DC

## 2019-08-25 MED ORDER — LISINOPRIL 10 MG PO TABS
10.0000 mg | ORAL_TABLET | Freq: Every day | ORAL | 3 refills | Status: DC
Start: 2019-08-25 — End: 2019-11-09

## 2019-08-25 MED ORDER — TAMSULOSIN HCL 0.4 MG PO CAPS: 0.4000 mg | ORAL_CAPSULE | Freq: Every day | ORAL | 3 refills | Status: DC

## 2019-08-25 NOTE — Assessment & Plan Note (Signed)
Chronic Check a1c Low sugar / carb diet Stressed regular exercise  

## 2019-08-25 NOTE — Assessment & Plan Note (Signed)
Chronic Seen on CT earlier this year Is on rosuvastatin Continue Check lipids

## 2019-08-25 NOTE — Assessment & Plan Note (Signed)
Chronic Check lipid panel  Continue daily statin Regular exercise and healthy diet encouraged  

## 2019-08-25 NOTE — Assessment & Plan Note (Signed)
Chronic Continue Flomax Symptoms controlled

## 2019-08-25 NOTE — Assessment & Plan Note (Signed)
In legs-likely related to radiculopathy Check B12

## 2019-08-25 NOTE — Assessment & Plan Note (Signed)
Chronic His abdominal pain is periumbilical-just above the umbilical region and seems to be worse after bending and moving around.  Not related to food Tylenol improves it as does eating jalapeno peppers Likely small hernia-not evident on recent imaging Discussed surgery referral-he deferred He is still concerned the valsartan may be causing this pain, which I do not think is the case, but we will go ahead and switch his medication Has GI follow-up

## 2019-08-25 NOTE — Assessment & Plan Note (Signed)
Chronic Following with GI Denies any GERD Had an EGD earlier this year and there was evidence of erythema/gastritis-PPI increased to 40 mg daily.  Still having upper GI pain, but this does not sound like the pain is gastritis

## 2019-08-25 NOTE — Assessment & Plan Note (Signed)
Chronic Blood pressure well controlled He is concerned the valsartan may be causing his leg pain when he walks, which sounds more like neurogenic claudication and upper abdominal pain.  He wonders about switching this Doubt these are from the valsartan, but will discontinue and start lisinopril 10 mg daily.  Advised him to stop the valsartan for couple of days and monitor his blood pressure and see if his symptoms change and then start the lisinopril or start sooner if BP is elevated CMP

## 2019-08-25 NOTE — Assessment & Plan Note (Addendum)
Chronic Taking vitamin d daily Check level

## 2019-08-26 LAB — CBC WITH DIFFERENTIAL/PLATELET
Absolute Monocytes: 589 cells/uL (ref 200–950)
Basophils Absolute: 39 cells/uL (ref 0–200)
Basophils Relative: 0.7 %
Eosinophils Absolute: 88 cells/uL (ref 15–500)
Eosinophils Relative: 1.6 %
HCT: 50.9 % — ABNORMAL HIGH (ref 38.5–50.0)
Hemoglobin: 16.5 g/dL (ref 13.2–17.1)
Lymphs Abs: 1337 cells/uL (ref 850–3900)
MCH: 29.4 pg (ref 27.0–33.0)
MCHC: 32.4 g/dL (ref 32.0–36.0)
MCV: 90.6 fL (ref 80.0–100.0)
MPV: 10.4 fL (ref 7.5–12.5)
Monocytes Relative: 10.7 %
Neutro Abs: 3449 cells/uL (ref 1500–7800)
Neutrophils Relative %: 62.7 %
Platelets: 190 10*3/uL (ref 140–400)
RBC: 5.62 10*6/uL (ref 4.20–5.80)
RDW: 13.4 % (ref 11.0–15.0)
Total Lymphocyte: 24.3 %
WBC: 5.5 10*3/uL (ref 3.8–10.8)

## 2019-08-26 LAB — COMPREHENSIVE METABOLIC PANEL
AG Ratio: 1.5 (calc) (ref 1.0–2.5)
ALT: 12 U/L (ref 9–46)
AST: 18 U/L (ref 10–35)
Albumin: 4.3 g/dL (ref 3.6–5.1)
Alkaline phosphatase (APISO): 65 U/L (ref 35–144)
BUN: 13 mg/dL (ref 7–25)
CO2: 30 mmol/L (ref 20–32)
Calcium: 9.3 mg/dL (ref 8.6–10.3)
Chloride: 103 mmol/L (ref 98–110)
Creat: 1.07 mg/dL (ref 0.70–1.18)
Globulin: 2.8 g/dL (calc) (ref 1.9–3.7)
Glucose, Bld: 97 mg/dL (ref 65–99)
Potassium: 4.6 mmol/L (ref 3.5–5.3)
Sodium: 138 mmol/L (ref 135–146)
Total Bilirubin: 0.7 mg/dL (ref 0.2–1.2)
Total Protein: 7.1 g/dL (ref 6.1–8.1)

## 2019-08-26 LAB — LIPID PANEL
Cholesterol: 122 mg/dL (ref <200)
HDL: 33 mg/dL — ABNORMAL LOW (ref >=40)
LDL Cholesterol (Calc): 71 mg/dL (calc)
Non-HDL Cholesterol (Calc): 89 mg/dL (calc) (ref <130)
Total CHOL/HDL Ratio: 3.7 (calc) (ref <5.0)
Triglycerides: 98 mg/dL (ref <150)

## 2019-08-26 LAB — VITAMIN B12: Vitamin B-12: 832 pg/mL (ref 200–1100)

## 2019-08-26 LAB — HEMOGLOBIN A1C
Hgb A1c MFr Bld: 5.5 % of total Hgb (ref <5.7)
Mean Plasma Glucose: 111 (calc)
eAG (mmol/L): 6.2 (calc)

## 2019-08-26 LAB — TSH: TSH: 2.52 mIU/L (ref 0.40–4.50)

## 2019-08-26 LAB — VITAMIN D 25 HYDROXY (VIT D DEFICIENCY, FRACTURES): Vit D, 25-Hydroxy: 41 ng/mL (ref 30–100)

## 2019-09-07 ENCOUNTER — Other Ambulatory Visit: Payer: Self-pay | Admitting: Internal Medicine

## 2019-10-15 ENCOUNTER — Telehealth: Payer: Self-pay | Admitting: Cardiology

## 2019-10-15 NOTE — Telephone Encounter (Signed)
Per Pt since having COVID booster 09-16-19, starting 10-02-19 his HR is up at times (108). Lately HR is 72 or 86. In addition to HR heart changes; Pt states he has chest pressure at times.   Please call [phone]   Thanks renee

## 2019-10-15 NOTE — Telephone Encounter (Signed)
Cathey if you could please talk with the patient as well and see if these symptoms are resolving or continuing on a regular basis. If they are resolving, would simply observe for now. If they are continuing, get a nurse visit for ECG and place a Zio patch.

## 2019-10-15 NOTE — Telephone Encounter (Signed)
I will forward to Dr.McDowell

## 2019-10-15 NOTE — Telephone Encounter (Signed)
Patient states his HR today was 72, BP 147/68. Symptoms are resolving.He takes his VS twice a day and will call back if numbers trend up.

## 2019-10-18 DIAGNOSIS — D352 Benign neoplasm of pituitary gland: Secondary | ICD-10-CM | POA: Diagnosis not present

## 2019-10-28 DIAGNOSIS — D225 Melanocytic nevi of trunk: Secondary | ICD-10-CM | POA: Diagnosis not present

## 2019-10-28 DIAGNOSIS — L821 Other seborrheic keratosis: Secondary | ICD-10-CM | POA: Diagnosis not present

## 2019-10-28 DIAGNOSIS — L578 Other skin changes due to chronic exposure to nonionizing radiation: Secondary | ICD-10-CM | POA: Diagnosis not present

## 2019-10-28 DIAGNOSIS — L570 Actinic keratosis: Secondary | ICD-10-CM | POA: Diagnosis not present

## 2019-11-01 DIAGNOSIS — H43811 Vitreous degeneration, right eye: Secondary | ICD-10-CM | POA: Diagnosis not present

## 2019-11-02 DIAGNOSIS — H43393 Other vitreous opacities, bilateral: Secondary | ICD-10-CM | POA: Diagnosis not present

## 2019-11-02 DIAGNOSIS — H43813 Vitreous degeneration, bilateral: Secondary | ICD-10-CM | POA: Diagnosis not present

## 2019-11-02 DIAGNOSIS — H2513 Age-related nuclear cataract, bilateral: Secondary | ICD-10-CM | POA: Diagnosis not present

## 2019-11-09 ENCOUNTER — Telehealth: Payer: Self-pay | Admitting: Internal Medicine

## 2019-11-09 DIAGNOSIS — H2513 Age-related nuclear cataract, bilateral: Secondary | ICD-10-CM | POA: Diagnosis not present

## 2019-11-09 DIAGNOSIS — H52203 Unspecified astigmatism, bilateral: Secondary | ICD-10-CM | POA: Diagnosis not present

## 2019-11-09 DIAGNOSIS — D352 Benign neoplasm of pituitary gland: Secondary | ICD-10-CM | POA: Diagnosis not present

## 2019-11-09 DIAGNOSIS — H5340 Unspecified visual field defects: Secondary | ICD-10-CM | POA: Diagnosis not present

## 2019-11-09 MED ORDER — LISINOPRIL 10 MG PO TABS
20.0000 mg | ORAL_TABLET | Freq: Every day | ORAL | 3 refills | Status: DC
Start: 2019-11-09 — End: 2019-11-11

## 2019-11-09 NOTE — Telephone Encounter (Signed)
Increase lisinopril to 20mg  daily.  Have him update [place] with his BP in 2 weeks.

## 2019-11-09 NOTE — Telephone Encounter (Signed)
Spoke with patient and info given 

## 2019-11-09 NOTE — Telephone Encounter (Signed)
lisinopril (ZESTRIL) 10 MG tablet(new medication) Patient states his blood pressure has been running high (162/79) since he was switched to this medication so he doesn't know if we need to up the dose or go back to his old medication he was on for his blood pressure . valsartan (DIOVAN) 40 MG tablet [[id number]]  ( old medication)

## 2019-11-11 ENCOUNTER — Other Ambulatory Visit: Payer: Self-pay

## 2019-11-11 MED ORDER — LISINOPRIL 10 MG PO TABS: 20.0000 mg | ORAL_TABLET | Freq: Every day | ORAL | 3 refills | Status: DC

## 2019-11-11 NOTE — Telephone Encounter (Signed)
Refaxed for patient today and he has been made aware to check with pharmacy.

## 2019-11-11 NOTE — Telephone Encounter (Signed)
lisinopril (ZESTRIL) 10 MG tablet Patient states he just went to the pharmacy and they said they have never received the medication

## 2019-11-12 DIAGNOSIS — M479 Spondylosis, unspecified: Secondary | ICD-10-CM | POA: Diagnosis not present

## 2019-11-12 DIAGNOSIS — I10 Essential (primary) hypertension: Secondary | ICD-10-CM | POA: Diagnosis not present

## 2019-11-22 ENCOUNTER — Telehealth: Payer: Self-pay | Admitting: Internal Medicine

## 2019-11-22 ENCOUNTER — Other Ambulatory Visit: Payer: Self-pay

## 2019-11-22 MED ORDER — VALSARTAN 40 MG PO TABS: 40.0000 mg | ORAL_TABLET | Freq: Every day | ORAL | 3 refills | Status: DC

## 2019-11-22 NOTE — Telephone Encounter (Signed)
Ok- restart valsartan and d/c lisinopril - med list updated.  Can send in refill of valsartan if needed.   Monitor BP at home.

## 2019-11-22 NOTE — Telephone Encounter (Signed)
    Pt c/o medication issue:  1. Name of Medication: lisinopril (ZESTRIL) 10 MG tablet  2. How are you currently taking this medication (dosage and times per day)? As written  3. Are you having a reaction (difficulty breathing--STAT)? n/a  4. What is your medication issue? Patient states since medication was increased he has had dizziness, increases HR, headache, heavy legs 153/79 HR 101 168/77 HR 69  Patient wants to go back to taking Valsartan Appointment scheduled for 11/16

## 2019-11-22 NOTE — Telephone Encounter (Signed)
Spoke with patient and refill sent in.  He will log bp readings and call us back with his info.  Appointment cancelled.

## 2019-11-23 ENCOUNTER — Ambulatory Visit: Payer: Medicare Other | Admitting: Internal Medicine

## 2019-11-26 DIAGNOSIS — E221 Hyperprolactinemia: Secondary | ICD-10-CM | POA: Diagnosis not present

## 2019-11-26 DIAGNOSIS — D352 Benign neoplasm of pituitary gland: Secondary | ICD-10-CM | POA: Diagnosis not present

## 2019-11-26 DIAGNOSIS — Z5181 Encounter for therapeutic drug level monitoring: Secondary | ICD-10-CM | POA: Diagnosis not present

## 2019-11-26 DIAGNOSIS — E230 Hypopituitarism: Secondary | ICD-10-CM | POA: Diagnosis not present

## 2019-12-10 ENCOUNTER — Telehealth: Payer: Self-pay | Admitting: Internal Medicine

## 2019-12-10 NOTE — Progress Notes (Signed)
°  Chronic Care Management   Note  12/10/2019 Name: BREYER TEJERA MRN: [id number] DOB: 1945-09-28  JUANDAVID DALLMAN is a 74 y.o. year old male who is a primary care patient of Burns, Claudina Lick, MD. I reached out to [name] by phone today in response to a referral sent by Mr. Bubba Vanbenschoten Funk's PCP, [name], MD.   Mr. Tadesse was given information about Chronic Care Management services today including:  1. CCM service includes personalized support from designated clinical staff supervised by his physician, including individualized plan of care and coordination with other care providers 2. 24/7 contact phone numbers for assistance for urgent and routine care needs. 3. Service will only be billed when office clinical staff spend 20 minutes or more in a month to coordinate care. 4. Only one practitioner may furnish and bill the service in a calendar month. 5. The patient may stop CCM services at any time (effective at the end of the month) by phone call to the office staff.   Patient agreed to services and verbal consent obtained.   Follow up plan:   Carley Perdue [employer] Scheduler

## 2019-12-10 NOTE — Progress Notes (Signed)
  Chronic Care Management   Outreach Note  12/10/2019 Name: Patrick Harris MRN: [id number] DOB: 1945/11/07  Referred by: [name], MD Reason for referral : No chief complaint on file.   An unsuccessful telephone outreach was attempted today. The patient was referred to the pharmacist for assistance with care management and care coordination.   Follow Up Plan:   Carley Perdue [employer] Scheduler

## 2019-12-14 DIAGNOSIS — H2513 Age-related nuclear cataract, bilateral: Secondary | ICD-10-CM | POA: Diagnosis not present

## 2019-12-14 DIAGNOSIS — H43813 Vitreous degeneration, bilateral: Secondary | ICD-10-CM | POA: Diagnosis not present

## 2019-12-14 DIAGNOSIS — H43393 Other vitreous opacities, bilateral: Secondary | ICD-10-CM | POA: Diagnosis not present

## 2019-12-28 ENCOUNTER — Telehealth: Payer: Self-pay | Admitting: Pharmacist

## 2019-12-28 NOTE — Progress Notes (Signed)
Chronic Care Management Pharmacy Assistant   Name: Patrick Harris  MRN: [id number] DOB: [birth year]  Reason for Encounter: Initial Questions   PCP : [name], MD  Allergies:   Allergies  Allergen Reactions  . Latex     Contact  dermatitis  . Lisinopril     Leg pain, inc HR, dizziness, headache  . Amlodipine     Edema   . Hydrochlorothiazide Other (See Comments)    Possibly caused pancreatitis 2018  . Simvastatin     Joint pain    Medications: Outpatient Encounter Medications as of 12/28/2019  Medication Sig  . acetaminophen (TYLENOL) 500 MG tablet Take 500 mg by mouth every 6 (six) hours as needed.  Marland Kitchen aspirin EC 81 MG tablet Take 1 tablet (81 mg total) by mouth at bedtime.  . bromocriptine (PARLODEL) 5 MG capsule Take 10 mg by mouth daily.  . Calcium-Magnesium-Zinc (CAL-MAG-ZINC PO) Take by mouth daily.    . Cholecalciferol (VITAMIN D3) 2000 UNITS TABS Take by mouth. 2000 iu in the am, 1000 iu in the pm  . Coenzyme Q10 (COQ10) 100 MG CAPS Take by mouth daily.    Marland Kitchen CRANBERRY PO Take by mouth. 1 tablespoon daily   . Flaxseed, Linseed, (FLAX SEED OIL PO) Take by mouth.    . Garlic 8295 MG CAPS Take 1,000 mg by mouth 2 (two) times daily.  . Iodine, Kelp, (KELP PO) Take by mouth daily.  Marland Kitchen L-Arginine 500 MG CAPS Take by mouth daily.    Marland Kitchen omeprazole (PRILOSEC) 40 MG capsule Take 1 capsule (40 mg total) by mouth daily.  . Pomegranate Seed 80 % OIL by Does not apply route. 1 table spoon a day   . Potassium 99 MG TABS Take by mouth daily.    . Probiotic Product (PROBIOTIC DAILY PO) Take by mouth daily.  . rosuvastatin (CRESTOR) 20 MG tablet TAKE HALF A TABLET (10 MG) BY MOUTH ONCE DAILY.  . tamsulosin (FLOMAX) 0.4 MG CAPS capsule Take 1 capsule (0.4 mg total) by mouth daily.  . Testosterone 20.25 MG/ACT (1.62%) GEL SMARTSIG:2 Pump Topical Daily  . Turmeric 500 MG CAPS Take by mouth.    . valsartan (DIOVAN) 40 MG tablet Take 1 tablet (40 mg total) by mouth daily.  .  vitamin C (ASCORBIC ACID) 500 MG tablet Take 500 mg by mouth 2 (two) times daily.   No facility-administered encounter medications on file as of 12/28/2019.    Current Diagnosis: Patient Active Problem List   Diagnosis Date Noted  . Aortic atherosclerosis ([place]) 08/25/2019  . Inguinal hernia 08/25/2019  . Chronic periumbilical pain 62/13/0865  . Neurogenic claudication ([place]) 08/24/2018  . Neck pain 03/02/2018  . Neuralgia 03/02/2018  . Prediabetes 08/22/2017  . Umbilical hernia 78/46/9629  . Varicose veins of both lower extremities 05/22/2016  . Vitamin D deficiency 08/21/2015  . Hypertension 01/24/2015  . Tingling in extremities 01/24/2015  . Family history of prostate cancer 08/19/2014  . Osteopenia 08/07/2009  . Hyperlipidemia 08/12/2007  . PITUITARY INSUFFICIENCY 08/05/2007  . Testosterone deficiency 08/05/2007  . Coronary atherosclerosis 08/05/2007  . GERD 08/05/2007  . BPH without obstruction/lower urinary tract symptoms 08/05/2007    Goals Addressed   None     Follow-Up:  Pharmacist Review   Have you seen any other providers since your last visit? The patient stated that he went to the doctor about his neck problem  Any changes in your medications or health? The patient states that at one  time he was taking lisinopril at one time but no longer taking it  Any side effects from any medications? The patient stated that he has been having some issues with his stomach and his blood pressure elevated but not sure if it was one of the medications or not  Do you have an symptoms or problems not managed by your medications? The patient does not have any symptoms not managed by medication  Any concerns about your health right now? His concern is his blood pressure being elevated, and neck issues  Has your provider asked that you check blood pressure, blood sugar, or follow special diet at home? The patient stated that his blood pressure has been elevated when he started  taking the lisinopril and after he got his booster shot. He did stop taking the lisinopril and started back on the valsartan  Do you get any type of exercise on a regular basis? The patient states that he is active and uses stretch bands for exercise  Can you think of a goal you would like to reach for your health? The patient states that his health goal is to be pain free from neck problems  Do you have any problems getting your medications? The patient states that he has no problems with getting his medications from the pharmacy  Is there anything that you would like to discuss during the appointment? The patient states that he would like to discuss his blood pressure medications  Please bring medications and supplements to appointment   Wendy Poet, [place]

## 2019-12-29 ENCOUNTER — Other Ambulatory Visit: Payer: Self-pay | Admitting: Internal Medicine

## 2019-12-29 ENCOUNTER — Other Ambulatory Visit: Payer: Self-pay

## 2019-12-29 ENCOUNTER — Ambulatory Visit: Payer: Medicare Other | Admitting: Pharmacist

## 2019-12-29 DIAGNOSIS — I10 Essential (primary) hypertension: Secondary | ICD-10-CM

## 2019-12-29 DIAGNOSIS — E782 Mixed hyperlipidemia: Secondary | ICD-10-CM

## 2019-12-29 MED ORDER — VALSARTAN 40 MG PO TABS: 40.0000 mg | ORAL_TABLET | Freq: Two times a day (BID) | ORAL | 1 refills | Status: DC

## 2019-12-29 NOTE — Progress Notes (Signed)
I agree - have him increase to 40 mg BID.  If not controlled after that have him schedule an appt.

## 2019-12-29 NOTE — Chronic Care Management (AMB) (Signed)
Chronic Care Management Pharmacy  Name: Patrick Harris  MRN: [id number] DOB: 1945/04/03   Chief Complaint/ HPI  Patrick Harris,  74 y.o. , male presents for his Initial CCM visit with the clinical pharmacist via telephone due to COVID-19 Pandemic.  PCP : [name], MD Patient Care Team: [name], MD as PCP - General (Internal Medicine) Satira Sark, MD as PCP - Cardiology (Cardiology) Ladene Artist, MD as Consulting Physician (Gastroenterology) Anda Kraft, MD as Consulting Physician (Endocrinology) Charlton Haws, [facility] as Pharmacist (Pharmacist)  Patient's chronic conditions include: Hypertension, Hyperlipidemia, Coronary Artery Disease, GERD, Osteopenia and BPH, pituitary insufficiency   Office Visits: 11/22/19 Dr Quay Burow - changed lisinopril back to valsartan due to side effects 08/25/19 Dr Quay Burow OV: pt concerned valsartan causing leg pain, changed to lisinopril  Consult Visit: Dr Buddy Duty (endocrine): f/u regularly for pituitary insufficiency   Subjective: Patient lives with wife and helps care for 42 and 71 year old grandson who recently lost their mother (patient's daughter) this year.  Objective: Allergies  Allergen Reactions  . Latex     Contact  dermatitis  . Lisinopril     Leg pain, inc HR, dizziness, headache  . Amlodipine     Edema   . Hydrochlorothiazide Other (See Comments)    Possibly caused pancreatitis 2018  . Simvastatin     Joint pain   Medications: Outpatient Encounter Medications as of 12/29/2019  Medication Sig  . acetaminophen (TYLENOL) 500 MG tablet Take 500 mg by mouth every 6 (six) hours as needed.  Marland Kitchen aspirin EC 81 MG tablet Take 1 tablet (81 mg total) by mouth at bedtime.  . bromocriptine (PARLODEL) 5 MG capsule Take 15 mg by mouth daily.  . Cholecalciferol (VITAMIN D3) 2000 UNITS TABS Take by mouth. 2000 iu in the am, 1000 iu in the pm  . omeprazole (PRILOSEC) 40 MG capsule Take 1 capsule (40 mg total) by  mouth daily.  . rosuvastatin (CRESTOR) 20 MG tablet TAKE HALF A TABLET (10 MG) BY MOUTH ONCE DAILY.  . tamsulosin (FLOMAX) 0.4 MG CAPS capsule Take 1 capsule (0.4 mg total) by mouth daily.  . Testosterone 20.25 MG/ACT (1.62%) GEL SMARTSIG:2 Pump Topical Daily  . valsartan (DIOVAN) 40 MG tablet Take 1 tablet (40 mg total) by mouth daily.  . vitamin C (ASCORBIC ACID) 500 MG tablet Take 500 mg by mouth 2 (two) times daily.  . Flaxseed, Linseed, (FLAX SEED OIL PO) Take by mouth.   (Patient not taking: Reported on 12/29/2019)  . Pomegranate Seed 80 % OIL by Does not apply route. 1 table spoon a day  (Patient not taking: Reported on 12/29/2019)  . [DISCONTINUED] Calcium-Magnesium-Zinc (CAL-MAG-ZINC PO) Take by mouth daily.   (Patient not taking: Reported on 12/29/2019)  . [DISCONTINUED] Coenzyme Q10 (COQ10) 100 MG CAPS Take by mouth daily.   (Patient not taking: Reported on 12/29/2019)  . [DISCONTINUED] CRANBERRY PO Take by mouth. 1 tablespoon daily  (Patient not taking: Reported on 12/29/2019)  . [DISCONTINUED] Garlic 0277 MG CAPS Take 1,000 mg by mouth 2 (two) times daily. (Patient not taking: Reported on 12/29/2019)  . [DISCONTINUED] Iodine, Kelp, (KELP PO) Take by mouth daily. (Patient not taking: Reported on 12/29/2019)  . [DISCONTINUED] L-Arginine 500 MG CAPS Take by mouth daily.   (Patient not taking: Reported on 12/29/2019)  . [DISCONTINUED] Potassium 99 MG TABS Take by mouth daily.   (Patient not taking: Reported on 12/29/2019)  . [DISCONTINUED] Probiotic Product (PROBIOTIC DAILY  PO) Take by mouth daily. (Patient not taking: Reported on 12/29/2019)  . [DISCONTINUED] Turmeric 500 MG CAPS Take by mouth.   (Patient not taking: Reported on 12/29/2019)   No facility-administered encounter medications on file as of 12/29/2019.    Wt Readings from Last 3 Encounters:  08/25/19 189 lb (85.7 kg)  03/29/19 185 lb (83.9 kg)  03/18/19 185 lb 8 oz (84.1 kg)    Lab Results  Component Value Date    CREATININE 1.07 08/25/2019   BUN 13 08/25/2019   GFR 87.01 03/18/2019   GFRNONAA >60 11/15/2016   GFRAA >60 11/15/2016   NA 138 08/25/2019   K 4.6 08/25/2019   CALCIUM 9.3 08/25/2019   CO2 30 08/25/2019     Current Diagnosis/Assessment:  SDOH Interventions   Flowsheet Row Most Recent Value  SDOH Interventions   Financial Strain Interventions Other (Comment)  [bromocriptine is > $100/month, will attempt tier exception]      Goals Addressed            This Visit's Progress   . Pharmacy Care Plan       CARE PLAN ENTRY (see longitudinal plan of care for additional care plan information)  Current Barriers:  . Chronic Disease Management support, education, and care coordination needs related to Hypertension and Hyperlipidemia   Hypertension BP Readings from Last 3 Encounters:  08/25/19 126/76  03/29/19 131/60  03/18/19 120/62 .  Pharmacist Clinical Goal(s): o Over the next 30 days, patient will work with PharmD and providers to maintain BP goal <130/80 . Current regimen:  o Valsartan 40 mg daily . Interventions: o Discussed BP goals and benefits of medications for prevention of heart attack / stroke o Recommend to increase valsartan to 80 mg (2 tab daily) . Patient self care activities - Over the next 30 days, patient will: o Check BP daily, document, and provide at future appointments o Ensure daily salt intake < 2300 mg/day  Hyperlipidemia Lab Results  Component Value Date/Time   LDLCALC 71 08/25/2019 08:47 AM .  Pharmacist Clinical Goal(s): o Over the next 30 days, patient will work with PharmD and providers to maintain LDL goal < 70 . Current regimen:  o Rosuvastatin 20 mg daily o Aspirin 81 mg daily . Interventions: o Discussed cholesterol goals and benefits of medications for prevention of heart attack / stroke o Discussed interaction between grapefruit juice and rosuvastatin is not clinically relevant . Patient self care activities - Over the next 30  days, patient will: o Continue current medications  Medication management . Pharmacist Clinical Goal(s): o Over the next 30 days, patient will work with PharmD and providers to maintain optimal medication adherence . [facility] . Interventions o Comprehensive medication review performed. o Continue current medication management strategy . Patient self care activities - Over the next 30 days, patient will: o Focus on medication adherence by fill date o Take medications as prescribed o Report any questions or concerns to PharmD and/or provider(s)  Initial goal documentation       Hypertension   BP goal is:  <130/80  Office blood pressures are  BP Readings from Last 3 Encounters:  08/25/19 126/76  03/29/19 131/60  03/18/19 120/62   Patient checks BP at home daily Patient home BP readings are ranging:  On lisinopril 10 mg: 9/8 BP 168/80  9/18 152/84 HR 82 9/25 178/89 HR 108 11/14 131/78 HR 101  Changed to valsartan 11/27/19: 12/7 140/75 HR 81 Today 174/84 76  Patient has  failed these meds in the past: lisinopril Patient is currently uncontrolled on the following medications:  . Valsartan 40 mg daily  We discussed diet and exercise extensively; pt reports BP issues started when he switched to lisinopril and got worse after covid booster on 09/16/19. However he switched back to valsartan around 11/27/19 and BP has not improved back to baseline  Plan  Recommend to increase valsartan to 80 mg daily (40 mg x 2)    Hyperlipidemia   LDL goal < 70  Last lipids Lab Results  Component Value Date   CHOL 122 08/25/2019   HDL 33 (L) 08/25/2019   LDLCALC 71 08/25/2019   TRIG 98 08/25/2019   CHOLHDL 3.7 08/25/2019   Hepatic Function Latest Ref Rng & Units 08/25/2019 03/18/2019 08/24/2018  Total Protein 6.1 - 8.1 g/dL 7.1 7.2 7.0  Albumin 3.5 - 5.2 g/dL - 4.1 4.3  AST 10 - 35 U/L _0 ALT 9 - 46 U/L _1 Alk Phosphatase 39 - 117 U/L - 64 61  Total  Bilirubin 0.2 - 1.2 mg/dL 0.7 0.6 0.6  Bilirubin, Direct 0.0 - 0.3 mg/dL - - -    The ASCVD Risk score (Rowan., et al., 2013) failed to calculate for the following reasons:   The valid total cholesterol range is 130 to 320 mg/dL   Patient has failed these meds in past: n/a Patient is currently controlled on the following medications:  . Rosuvastatin 20 mg daily - 1/2 tab daily . Aspirin 81 mg daily  We discussed:  diet and exercise extensively; Cholesterol goals; benefits of statin for ASCVD risk reduction  Plan  Continue current medications and control with diet and exercise  GERD   Patient has failed these meds in past: n/a Patient is currently controlled on the following medications:  . Omeprazole 40 mg daily  We discussed:  Pt reports GI doctor increased omeprazole to 40 mg after EGD earlier this year; pt reports he no difference in symptoms since change was made; pt denies reflux/acid issues, complains of nausea more than reflux  Plan  Continue current medications  BPH   Lab Results  Component Value Date/Time   PSA 1.5 08/25/2019 09:07 AM   PSA 1.00 08/24/2018 09:05 AM   PSA 1.69 08/12/2016 08:36 AM   Patient has failed these meds in past: n/a Patient is currently controlled on the following medications:  . Tamsulosin 0.4 mg daily  We discussed:  Patient is satisfied with current regimen and denies issues  Plan  Continue current medications  Pituitary insufficiency   Managed per Dr Buddy Duty "Testosterone around 325"  Patient has failed these meds in past: n/a Patient is currently controlled on the following medications:  . Bromocriptine 5 mg - 3 cap daily ($>100 per month) . Testosterone 1.62% gel  We discussed:  Pt reports bromocriptine is >$100 per month. Per formulary it is Tier 3 so patient is likely in donut hole now, ordinarily it should be $47/month. Will attempt tier exception given chronic nature of medication  Plan  Continue current  medications  Bromocriptine tier exception  Osteopenia   Last DEXA Scan: not on file  Last vitamin D/Calcium Lab Results  Component Value Date   VD25OH 41 08/25/2019   CALCIUM 9.3 08/25/2019   Patient has failed these meds in past: n/a Patient is currently controlled on the following medications:  . Calcium-magnesium-zinc . Vitamin D 2000 IU BID  We discussed:  Recommend [phone] units  of vitamin D daily. Recommend 1200 mg of calcium daily from dietary and supplemental sources.  Plan  Continue current medications  Vaccines   Reviewed and discussed patient's vaccination history.    Immunization History  Administered Date(s) Administered  . Influenza Whole 10/07/2007, 10/17/2008  . Influenza, High Dose Seasonal PF 10/16/2012, 10/15/2013, 10/20/2017, 10/19/2018, 11/03/2019  . Influenza-Unspecified 10/25/2015, 10/14/2016  . Moderna Sars-Covid-2 Vaccination 09/16/2019  . Pneumococcal Conjugate-13 08/19/2014  . Pneumococcal Polysaccharide-23 08/09/2010  . Td 01/07/2001  . Tdap 09/30/2011   Pt reports he received 3 doses of Moderna covid vaccine. He only had date of booster dose available at time of appointment. Updated immunization w/ date of booster dose.  Plan  Recommended patient receive Shingrix vaccine.   Health Maintenance   Patient is currently controlled on the following medications:  Marland Kitchen Vitamin C 500 mg  We discussed:  Pt recently stopped taking all supplements except for vitamins due to BP and stomach issues, he has not noticed any change since stopping; Advised it is ok to re-introduce supplements one by one if he desires (it is also reasonable to stop taking them)  Plan  Continue current medications   Medication Management   [facility]  [id number] [place] [id number]: [phone]: [phone] Uses pill box? No - prefers bottles Pt  endorses 100% compliance  We discussed: Current pharmacy is preferred with insurance plan and patient is satisfied with pharmacy services  Plan  Continue current medication management strategy    Follow up: 1 month phone visit  LCharlene Brooke PharmD, BCACP Clinical Pharmacist [phone]

## 2019-12-29 NOTE — Patient Instructions (Addendum)
Visit Information  Phone number for Pharmacist: [phone]  Thank you for meeting with me to discuss your medications! I look forward to working with you to achieve your health care goals. Below is a summary of what we talked about during the visit:  Goals Addressed            This Visit's Progress   . Pharmacy Care Plan       CARE PLAN ENTRY (see longitudinal plan of care for additional care plan information)  Current Barriers:  . Chronic Disease Management support, education, and care coordination needs related to Hypertension and Hyperlipidemia   Hypertension BP Readings from Last 3 Encounters:  08/25/19 126/76  03/29/19 131/60  03/18/19 120/62 .  Pharmacist Clinical Goal(s): o Over the next 30 days, patient will work with PharmD and providers to maintain BP goal <130/80 . Current regimen:  o Valsartan 40 mg daily . Interventions: o Discussed BP goals and benefits of medications for prevention of heart attack / stroke o Recommend to increase valsartan to 80 mg (2 tab daily) . Patient self care activities - Over the next 30 days, patient will: o Check BP daily, document, and provide at future appointments o Ensure daily salt intake < 2300 mg/day  Hyperlipidemia Lab Results  Component Value Date/Time   LDLCALC 71 08/25/2019 08:47 AM .  Pharmacist Clinical Goal(s): o Over the next 30 days, patient will work with PharmD and providers to maintain LDL goal < 70 . Current regimen:  o Rosuvastatin 20 mg daily o Aspirin 81 mg daily . Interventions: o Discussed cholesterol goals and benefits of medications for prevention of heart attack / stroke o Discussed interaction between grapefruit juice and rosuvastatin is not clinically relevant . Patient self care activities - Over the next 30 days, patient will: o Continue current medications  Medication management . Pharmacist Clinical Goal(s): o Over the next 30 days, patient will work with PharmD and providers to maintain  optimal medication adherence . [facility] . Interventions o Comprehensive medication review performed. o Continue current medication management strategy . Patient self care activities - Over the next 30 days, patient will: o Focus on medication adherence by fill date o Take medications as prescribed o Report any questions or concerns to PharmD and/or provider(s)  Initial goal documentation      Mr. Keelan was given information about Chronic Care Management services today including:  1. CCM service includes personalized support from designated clinical staff supervised by his physician, including individualized plan of care and coordination with other care providers 2. 24/7 contact phone numbers for assistance for urgent and routine care needs. 3. Standard insurance, coinsurance, copays and deductibles apply for chronic care management only during months in which we provide at least 20 minutes of these services. Most insurances cover these services at 100%, however patients may be responsible for any copay, coinsurance and/or deductible if applicable. This service may help you avoid the need for more expensive face-to-face services. 4. Only one practitioner may furnish and bill the service in a calendar month. 5. The patient may stop CCM services at any time (effective at the end of the month) by phone call to the office staff.  Patient agreed to services and verbal consent obtained.   The patient verbalized understanding of instructions, educational materials, and care plan provided today and agreed to receive a mailed copy of patient instructions, educational materials, and care plan.  Telephone follow up appointment with pharmacy team member scheduled for: 1  month  Charlene Brooke, PharmD, BCACP Clinical Pharmacist [facility] [phone]  [place] stands for "Dietary Approaches to Stop Hypertension." The DASH eating plan is a  healthy eating plan that has been shown to reduce high blood pressure (hypertension). It may also reduce your risk for type 2 diabetes, heart disease, and stroke. The DASH eating plan may also help with weight loss. What are tips for following this plan?  General guidelines  Avoid eating more than 2,300 mg (milligrams) of salt (sodium) a day. If you have hypertension, you may need to reduce your sodium intake to 1,500 mg a day.  Limit alcohol intake to no more than 1 drink a day for nonpregnant women and 2 drinks a day for men. One drink equals 12 oz of beer, 5 oz of wine, or 1 oz of hard liquor.  Work with your health care provider to maintain a healthy body weight or to lose weight. Ask what an ideal weight is for you.  Get at least 30 minutes of exercise that causes your heart to beat faster (aerobic exercise) most days of the week. Activities may include walking, swimming, or biking.  Work with your health care provider or diet and nutrition specialist (dietitian) to adjust your eating plan to your individual calorie needs. Reading food labels   Check food labels for the amount of sodium per serving. Choose foods with less than 5 percent of the Daily Value of sodium. Generally, foods with less than 300 mg of sodium per serving fit into this eating plan.  To find whole grains, look for the word "whole" as the first word in the ingredient list. Shopping  Buy products labeled as "low-sodium" or "no salt added."  Buy fresh foods. Avoid canned foods and premade or frozen meals. Cooking  Avoid adding salt when cooking. Use salt-free seasonings or herbs instead of table salt or sea salt. Check with your health care provider or pharmacist before using salt substitutes.  Do not fry foods. Cook foods using healthy methods such as baking, boiling, grilling, and broiling instead.  Cook with heart-healthy oils, such as olive, canola, soybean, or sunflower oil. Meal planning  Eat a balanced  diet that includes: ? 5 or more servings of fruits and vegetables each day. At each meal, try to fill half of your plate with fruits and vegetables. ? Up to 6-8 servings of whole grains each day. ? Less than 6 oz of lean meat, poultry, or fish each day. A 3-oz serving of meat is about the same size as a deck of cards. One egg equals 1 oz. ? 2 servings of low-fat dairy each day. ? A serving of nuts, seeds, or beans 5 times each week. ? Heart-healthy fats. Healthy fats called Omega-3 fatty acids are found in foods such as flaxseeds and coldwater fish, like sardines, salmon, and mackerel.  Limit how much you eat of the following: ? Canned or prepackaged foods. ? Food that is high in trans fat, such as fried foods. ? Food that is high in saturated fat, such as fatty meat. ? Sweets, desserts, sugary drinks, and other foods with added sugar. ? Full-fat dairy products.  Do not salt foods before eating.  Try to eat at least 2 vegetarian meals each week.  Eat more home-cooked food and less restaurant, buffet, and fast food.  When eating at a restaurant, ask that your food be prepared with less salt or no salt, if possible. What  foods are recommended? The items listed may not be a complete list. Talk with your dietitian about what dietary choices are best for you. Grains Whole-grain or whole-wheat bread. Whole-grain or whole-wheat pasta. Brown rice. [place]. Bulgur. Whole-grain and low-sodium cereals. Pita bread. Low-fat, low-sodium crackers. Whole-wheat flour tortillas. Vegetables Fresh or frozen vegetables (raw, steamed, roasted, or grilled). Low-sodium or reduced-sodium tomato and vegetable juice. Low-sodium or reduced-sodium tomato sauce and tomato paste. Low-sodium or reduced-sodium canned vegetables. Fruits All fresh, dried, or frozen fruit. Canned fruit in natural juice (without added sugar). Meat and other protein foods Skinless chicken or [place]. Ground chicken or [place]. Pork  with fat trimmed off. Fish and seafood. Egg whites. Dried beans, peas, or lentils. Unsalted nuts, nut butters, and seeds. Unsalted canned beans. Lean cuts of beef with fat trimmed off. Low-sodium, lean deli meat. Dairy Low-fat (1%) or fat-free (skim) milk. Fat-free, low-fat, or reduced-fat cheeses. Nonfat, low-sodium ricotta or cottage cheese. Low-fat or nonfat yogurt. Low-fat, low-sodium cheese. Fats and oils Soft margarine without trans fats. Vegetable oil. Low-fat, reduced-fat, or light mayonnaise and salad dressings (reduced-sodium). Canola, safflower, olive, soybean, and sunflower oils. Avocado. Seasoning and other foods Herbs. Spices. Seasoning mixes without salt. Unsalted popcorn and pretzels. Fat-free sweets. What foods are not recommended? The items listed may not be a complete list. Talk with your dietitian about what dietary choices are best for you. Grains Baked goods made with fat, such as croissants, muffins, or some breads. Dry pasta or rice meal packs. Vegetables Creamed or fried vegetables. Vegetables in a cheese sauce. Regular canned vegetables (not low-sodium or reduced-sodium). Regular canned tomato sauce and paste (not low-sodium or reduced-sodium). Regular tomato and vegetable juice (not low-sodium or reduced-sodium). Angie Fava. Olives. Fruits Canned fruit in a light or heavy syrup. Fried fruit. Fruit in cream or butter sauce. Meat and other protein foods Fatty cuts of meat. Ribs. Fried meat. Berniece Salines. Sausage. Bologna and other processed lunch meats. Salami. Fatback. Hotdogs. Bratwurst. Salted nuts and seeds. Canned beans with added salt. Canned or smoked fish. Whole eggs or egg yolks. Chicken or [place] with skin. Dairy Whole or 2% milk, cream, and half-and-half. Whole or full-fat cream cheese. Whole-fat or sweetened yogurt. Full-fat cheese. Nondairy creamers. Whipped toppings. Processed cheese and cheese spreads. Fats and oils Butter. Stick margarine. Lard. Shortening. Ghee.  Bacon fat. Tropical oils, such as coconut, palm kernel, or palm oil. Seasoning and other foods Salted popcorn and pretzels. Onion salt, garlic salt, seasoned salt, table salt, and sea salt. Worcestershire sauce. Tartar sauce. Barbecue sauce. Teriyaki sauce. Soy sauce, including reduced-sodium. Steak sauce. Canned and packaged gravies. Fish sauce. Oyster sauce. Cocktail sauce. Horseradish that you find on the shelf. Ketchup. Mustard. Meat flavorings and tenderizers. Bouillon cubes. Hot sauce and Tabasco sauce. Premade or packaged marinades. Premade or packaged taco seasonings. Relishes. Regular salad dressings. Where to find more information:  National Heart, Lung, and [place]: https://wilson-eaton.com/  American Heart Association: www.heart.org Summary  The DASH eating plan is a healthy eating plan that has been shown to reduce high blood pressure (hypertension). It may also reduce your risk for type 2 diabetes, heart disease, and stroke.  With the DASH eating plan, you should limit salt (sodium) intake to 2,300 mg a day. If you have hypertension, you may need to reduce your sodium intake to 1,500 mg a day.  When on the DASH eating plan, aim to eat more fresh fruits and vegetables, whole grains, lean proteins, low-fat dairy, and heart-healthy fats.  Work with  your health care provider or diet and nutrition specialist (dietitian) to adjust your eating plan to your individual calorie needs. This information is not intended to replace advice given to you by your health care provider. Make sure you discuss any questions you have with your health care provider. Document Revised: 12/06/2016 Document Reviewed: 12/18/2015 Elsevier Patient Education  2020 [employer].

## 2019-12-30 ENCOUNTER — Telehealth: Payer: Self-pay | Admitting: Internal Medicine

## 2019-12-30 NOTE — Telephone Encounter (Signed)
Patient calling to let you know that the  bromocriptine (PARLODEL) 5 MG capsule is $147 not $187 like he said yesterday, he just wanted to make sure you had the right number.

## 2020-01-03 DIAGNOSIS — M5442 Lumbago with sciatica, left side: Secondary | ICD-10-CM | POA: Diagnosis not present

## 2020-01-03 DIAGNOSIS — M9902 Segmental and somatic dysfunction of thoracic region: Secondary | ICD-10-CM | POA: Diagnosis not present

## 2020-01-03 DIAGNOSIS — M542 Cervicalgia: Secondary | ICD-10-CM | POA: Diagnosis not present

## 2020-01-03 DIAGNOSIS — M9903 Segmental and somatic dysfunction of lumbar region: Secondary | ICD-10-CM | POA: Diagnosis not present

## 2020-01-03 DIAGNOSIS — M9901 Segmental and somatic dysfunction of cervical region: Secondary | ICD-10-CM | POA: Diagnosis not present

## 2020-01-04 ENCOUNTER — Other Ambulatory Visit: Payer: Self-pay

## 2020-01-04 DIAGNOSIS — I10 Essential (primary) hypertension: Secondary | ICD-10-CM

## 2020-01-05 DIAGNOSIS — M5442 Lumbago with sciatica, left side: Secondary | ICD-10-CM | POA: Diagnosis not present

## 2020-01-05 DIAGNOSIS — M9903 Segmental and somatic dysfunction of lumbar region: Secondary | ICD-10-CM | POA: Diagnosis not present

## 2020-01-05 DIAGNOSIS — M542 Cervicalgia: Secondary | ICD-10-CM | POA: Diagnosis not present

## 2020-01-05 DIAGNOSIS — M9902 Segmental and somatic dysfunction of thoracic region: Secondary | ICD-10-CM | POA: Diagnosis not present

## 2020-01-05 DIAGNOSIS — M9901 Segmental and somatic dysfunction of cervical region: Secondary | ICD-10-CM | POA: Diagnosis not present

## 2020-01-06 ENCOUNTER — Telehealth: Payer: Self-pay | Admitting: Pharmacist

## 2020-01-06 NOTE — Progress Notes (Signed)
    Chronic Care Management Pharmacy Assistant   Name: Patrick Harris  MRN: [id number] DOB: 1945-04-26  Reason for Encounter: Chart Review   PCP : [name], MD  Allergies:   Allergies  Allergen Reactions  . Latex     Contact  dermatitis  . Lisinopril     Leg pain, inc HR, dizziness, headache  . Amlodipine     Edema   . Hydrochlorothiazide Other (See Comments)    Possibly caused pancreatitis 2018  . Simvastatin     Joint pain    Medications: Outpatient Encounter Medications as of 01/06/2020  Medication Sig  . acetaminophen (TYLENOL) 500 MG tablet Take 500 mg by mouth every 6 (six) hours as needed.  Marland Kitchen aspirin EC 81 MG tablet Take 1 tablet (81 mg total) by mouth at bedtime.  . bromocriptine (PARLODEL) 5 MG capsule Take 15 mg by mouth daily.  . Cholecalciferol (VITAMIN D3) 2000 UNITS TABS Take by mouth. 2000 iu in the am, 1000 iu in the pm  . omeprazole (PRILOSEC) 40 MG capsule Take 1 capsule (40 mg total) by mouth daily.  . rosuvastatin (CRESTOR) 20 MG tablet TAKE HALF A TABLET (10 MG) BY MOUTH ONCE DAILY.  . tamsulosin (FLOMAX) 0.4 MG CAPS capsule Take 1 capsule (0.4 mg total) by mouth daily.  . Testosterone 20.25 MG/ACT (1.62%) GEL SMARTSIG:2 Pump Topical Daily  . valsartan (DIOVAN) 40 MG tablet Take 1 tablet (40 mg total) by mouth 2 (two) times daily.  . vitamin C (ASCORBIC ACID) 500 MG tablet Take 500 mg by mouth 2 (two) times daily.   No facility-administered encounter medications on file as of 01/06/2020.    Current Diagnosis: Patient Active Problem List   Diagnosis Date Noted  . Aortic atherosclerosis ([place]) 08/25/2019  . Inguinal hernia 08/25/2019  . Chronic periumbilical pain 08/65/7846  . Neurogenic claudication ([place]) 08/24/2018  . Neck pain 03/02/2018  . Neuralgia 03/02/2018  . Prediabetes 08/22/2017  . Umbilical hernia 96/29/5284  . Varicose veins of both lower extremities 05/22/2016  . Vitamin D deficiency 08/21/2015  . Hypertension 01/24/2015   . Tingling in extremities 01/24/2015  . Family history of prostate cancer 08/19/2014  . Osteopenia 08/07/2009  . Hyperlipidemia 08/12/2007  . PITUITARY INSUFFICIENCY 08/05/2007  . Testosterone deficiency 08/05/2007  . Coronary atherosclerosis 08/05/2007  . GERD 08/05/2007  . BPH without obstruction/lower urinary tract symptoms 08/05/2007    Goals Addressed   None     Follow-Up:  Pharmacist Review   Reviewed chart for medication changes and adherence.  No OVs, consults, or hospital visits since last care coordination call/pharmacist visit No medication changes indicated  No gaps in adherence identified. Patient has follow up scheduled with pharmacy team. No further action required.   Wendy Poet, Clinical Pharmacist Assistant [facility]

## 2020-01-10 DIAGNOSIS — M9902 Segmental and somatic dysfunction of thoracic region: Secondary | ICD-10-CM | POA: Diagnosis not present

## 2020-01-10 DIAGNOSIS — M9901 Segmental and somatic dysfunction of cervical region: Secondary | ICD-10-CM | POA: Diagnosis not present

## 2020-01-10 DIAGNOSIS — M5442 Lumbago with sciatica, left side: Secondary | ICD-10-CM | POA: Diagnosis not present

## 2020-01-10 DIAGNOSIS — M542 Cervicalgia: Secondary | ICD-10-CM | POA: Diagnosis not present

## 2020-01-10 DIAGNOSIS — M9903 Segmental and somatic dysfunction of lumbar region: Secondary | ICD-10-CM | POA: Diagnosis not present

## 2020-01-12 DIAGNOSIS — M9903 Segmental and somatic dysfunction of lumbar region: Secondary | ICD-10-CM | POA: Diagnosis not present

## 2020-01-12 DIAGNOSIS — M9902 Segmental and somatic dysfunction of thoracic region: Secondary | ICD-10-CM | POA: Diagnosis not present

## 2020-01-12 DIAGNOSIS — M542 Cervicalgia: Secondary | ICD-10-CM | POA: Diagnosis not present

## 2020-01-12 DIAGNOSIS — M9901 Segmental and somatic dysfunction of cervical region: Secondary | ICD-10-CM | POA: Diagnosis not present

## 2020-01-12 DIAGNOSIS — M5442 Lumbago with sciatica, left side: Secondary | ICD-10-CM | POA: Diagnosis not present

## 2020-01-17 ENCOUNTER — Telehealth: Payer: Self-pay | Admitting: Pharmacist

## 2020-01-17 ENCOUNTER — Other Ambulatory Visit: Payer: Self-pay

## 2020-01-17 ENCOUNTER — Ambulatory Visit: Payer: Medicare Other | Admitting: Pharmacist

## 2020-01-17 DIAGNOSIS — M9903 Segmental and somatic dysfunction of lumbar region: Secondary | ICD-10-CM | POA: Diagnosis not present

## 2020-01-17 DIAGNOSIS — M542 Cervicalgia: Secondary | ICD-10-CM | POA: Diagnosis not present

## 2020-01-17 DIAGNOSIS — E782 Mixed hyperlipidemia: Secondary | ICD-10-CM

## 2020-01-17 DIAGNOSIS — M9901 Segmental and somatic dysfunction of cervical region: Secondary | ICD-10-CM | POA: Diagnosis not present

## 2020-01-17 DIAGNOSIS — E230 Hypopituitarism: Secondary | ICD-10-CM

## 2020-01-17 DIAGNOSIS — M9902 Segmental and somatic dysfunction of thoracic region: Secondary | ICD-10-CM | POA: Diagnosis not present

## 2020-01-17 DIAGNOSIS — I10 Essential (primary) hypertension: Secondary | ICD-10-CM

## 2020-01-17 DIAGNOSIS — M5442 Lumbago with sciatica, left side: Secondary | ICD-10-CM | POA: Diagnosis not present

## 2020-01-17 NOTE — Patient Instructions (Addendum)
Visit Information  Phone number for Pharmacist: [phone]  Goals Addressed            This Visit's Progress   . Pharmacy Care Plan       CARE PLAN ENTRY (see longitudinal plan of care for additional care plan information)  Current Barriers:  . Chronic Disease Management support, education, and care coordination needs related to Hypertension and Hyperlipidemia   Hypertension BP Readings from Last 3 Encounters:  08/25/19 126/76  03/29/19 131/60  03/18/19 120/62 .  Pharmacist Clinical Goal(s): o Over the next 30 days, patient will work with PharmD and providers to maintain BP goal <130/80 . Current regimen:  o Valsartan 40 mg - 2 tab daily . Interventions: o Discussed BP goals and benefits of medications for prevention of heart attack / stroke o Recommend to increase valsartan to 120 mg (3 tab daily) and follow up with cardiologist in 10 days as scheduled . Patient self care activities - Over the next 30 days, patient will: o Check BP daily, document, and provide at future appointments o Ensure daily salt intake < 2300 mg/day  Hyperlipidemia Lab Results  Component Value Date/Time   LDLCALC 71 08/25/2019 08:47 AM .  Pharmacist Clinical Goal(s): o Over the next 30 days, patient will work with PharmD and providers to maintain LDL goal < 70 . Current regimen:  o Rosuvastatin 20 mg daily o Aspirin 81 mg daily . Interventions: o Discussed cholesterol goals and benefits of medications for prevention of heart attack / stroke o Discussed interaction between grapefruit juice and rosuvastatin is not clinically relevant in his case . Patient self care activities - Over the next 30 days, patient will: o Continue current medications  Medication management . Pharmacist Clinical Goal(s): o Over the next 30 days, patient will work with PharmD and providers to maintain optimal medication adherence . [facility] . Interventions o Comprehensive medication review  performed. o Continue current medication management strategy . Patient self care activities - Over the next 30 days, patient will: o Focus on medication adherence by fill date o Take medications as prescribed o Report any questions or concerns to PharmD and/or provider(s)  Please see past updates related to this goal by clicking on the "Past Updates" button in the selected goal       The patient verbalized understanding of instructions, educational materials, and care plan provided today and declined offer to receive copy of patient instructions, educational materials, and care plan.  Telephone follow up appointment with pharmacy team member scheduled for: 1 month  Charlene Brooke, PharmD, BCACP Clinical Pharmacist [facility] [phone]  Cooking With Less [place] with less salt is one way to reduce the amount of sodium you get from food. Sodium is one of the elements that make up salt. It is found naturally in foods and is also added to certain foods. Depending on your condition and overall health, your health care provider or dietitian may recommend that you reduce your sodium intake. Most people should have less than 2,300 milligrams (mg) of sodium each day. If you have high blood pressure (hypertension), you may need to limit your sodium to 1,500 mg each day. Follow the tips below to help reduce your sodium intake. What are tips for eating less sodium? Reading food labels  Check the food label before buying or using packaged ingredients. Always check the label for the serving size and sodium content.  Look for products with no more than  140 mg of sodium in one serving.  Check the % Daily Value column to see what percent of the daily recommended amount of sodium is provided in one serving of the product. Foods with 5% or less in this column are considered low in sodium. Foods with 20% or higher are considered high in sodium.  Do not choose foods with  salt as one of the first three ingredients on the ingredients list. If salt is one of the first three ingredients, it usually means the item is high in sodium.   Shopping  Buy sodium-free or low-sodium products. Look for the following words on food labels: ? Low-sodium. ? Sodium-free. ? Reduced-sodium. ? No salt added. ? Unsalted.  Always check the sodium content even if foods are labeled as low-sodium or no salt added.  Buy fresh foods. Cooking  Use herbs, seasonings without salt, and spices as substitutes for salt.  Use sodium-free baking soda when baking.  Grill, braise, or roast foods to add flavor with less salt.  Avoid adding salt to pasta, rice, or hot cereals.  Drain and rinse canned vegetables, beans, and meat before use.  Avoid adding salt when cooking sweets and desserts.  Cook with low-sodium ingredients. What foods are high in sodium? Vegetables Regular canned vegetables (not low-sodium or reduced-sodium). Sauerkraut, pickled vegetables, and relishes. Olives. [place] fries. Onion rings. Regular canned tomato sauce and paste. Regular tomato and vegetable juice. Frozen vegetables in sauces. Grains Instant hot cereals. Bread stuffing, pancake, and biscuit mixes. Croutons. Seasoned rice or pasta mixes. Noodle soup cups. Boxed or frozen macaroni and cheese. Regular salted crackers. Self-rising flour. Rolls. Bagels. Flour tortillas and wraps. Meats and other proteins Meat or fish that is salted, canned, smoked, cured, spiced, or pickled. This includes bacon, ham, sausages, hot dogs, corned beef, chipped beef, meat loaves, salt pork, jerky, pickled herring, anchovies, regular canned tuna, and sardines. Salted nuts. Dairy Processed cheese and cheese spreads. Cheese curds. Blue cheese. Feta cheese. String cheese. Regular cottage cheese. Buttermilk. Canned milk. The items listed above may not be a complete list of foods high in sodium. Actual amounts of sodium may be different  depending on processing. Contact a dietitian for more information. What foods are low in sodium? Fruits Fresh, frozen, or canned fruit with no sauce added. Fruit juice. Vegetables Fresh or frozen vegetables with no sauce added. "No salt added" canned vegetables. "No salt added" tomato sauce and paste. Low-sodium or reduced-sodium tomato and vegetable juice. Grains Noodles, pasta, quinoa, rice. Shredded or puffed wheat or puffed rice. Regular or quick oats (not instant). Low-sodium crackers. Low-sodium bread. Whole-grain bread and whole-grain pasta. Unsalted popcorn. Meats and other proteins Fresh or frozen whole meats, poultry (not injected with sodium), and fish with no sauce added. Unsalted nuts. Dried peas, beans, and lentils without added salt. Unsalted canned beans. Eggs. Unsalted nut butters. Low-sodium canned tuna or chicken. Dairy Milk. Soy milk. Yogurt. Low-sodium cheeses, such as Swiss, Monterey Jack, [place], and [employer]. Sherbet or ice cream (keep to  cup per serving). Cream cheese. Fats and oils Unsalted butter or margarine. Other foods Homemade pudding. Sodium-free baking soda and baking powder. Herbs and spices. Low-sodium seasoning mixes. Beverages Coffee and tea. Carbonated beverages. The items listed above may not be a complete list of foods low in sodium. Actual amounts of sodium may be different depending on processing. Contact a dietitian for more information. What are some salt alternatives when cooking? The following are herbs, seasonings, and spices that can be  used instead of salt to flavor your food. Herbs should be fresh or dried. Do not choose packaged mixes. Next to the name of the herb, spice, or seasoning are some examples of foods you can pair it with. Herbs  Bay leaves - Soups, meat and vegetable dishes, and spaghetti sauce.  Basil - [employer], soups, pasta, and fish dishes.  Cilantro - Meat, poultry, and vegetable dishes.  Chili powder -  Marinades and Mexican dishes.  Chives - Salad dressings and potato dishes.  Cumin - Mexican dishes, couscous, and meat dishes.  Dill - Fish dishes, sauces, and salads.  Fennel - Meat and vegetable dishes, breads, and cookies.  Garlic (do not use garlic salt) - [place] dishes, meat dishes, salad dressings, and sauces.  Marjoram - Soups, potato dishes, and meat dishes.  Oregano - Pizza and spaghetti sauce.  Parsley - Salads, soups, pasta, and meat dishes.  Rosemary - [place] dishes, salad dressings, soups, and red meats.  Saffron - Fish dishes, pasta, and some poultry dishes.  Sage - Stuffings and sauces.  Tarragon - Fish and [employer].  Thyme - Stuffing, meat, and fish dishes. Seasonings  Lemon juice - Fish dishes, poultry dishes, vegetables, and salads.  Vinegar - Salad dressings, vegetables, and fish dishes. Spices  Cinnamon - Sweet dishes, such as cakes, cookies, and puddings.  Cloves - Gingerbread, puddings, and marinades for meats.  Curry - Vegetable dishes, fish and poultry dishes, and stir-fry dishes.  Ginger - Vegetable dishes, fish dishes, and stir-fry dishes.  Nutmeg - Pasta, vegetables, poultry, fish dishes, and custard. Summary  Cooking with less salt is one way to reduce the amount of sodium that you get from food.  Buy sodium-free or low-sodium products.  Check the food label before using or buying packaged ingredients.  Use herbs, seasonings without salt, and spices as substitutes for salt in foods. This information is not intended to replace advice given to you by your health care provider. Make sure you discuss any questions you have with your health care provider. Document Revised: 12/16/2018 Document Reviewed: 12/16/2018 Elsevier Patient Education  [place].

## 2020-01-17 NOTE — Progress Notes (Addendum)
° ° °  Chronic Care Management Pharmacy Assistant   Name: Patrick Harris  MRN: [id number] DOB: 1945-04-01  Reason for Encounter: Medication Review   PCP : Patrick Rail, MD  Allergies:   Allergies  Allergen Reactions   Latex     Contact  dermatitis   Lisinopril     Leg pain, inc HR, dizziness, headache   Amlodipine     Edema    Hydrochlorothiazide Other (See Comments)    Possibly caused pancreatitis 2018   Simvastatin     Joint pain    Medications: Outpatient Encounter Medications as of 01/17/2020  Medication Sig   acetaminophen (TYLENOL) 500 MG tablet Take 500 mg by mouth every 6 (six) hours as needed.   aspirin EC 81 MG tablet Take 1 tablet (81 mg total) by mouth at bedtime.   bromocriptine (PARLODEL) 5 MG capsule Take 15 mg by mouth daily.   Cholecalciferol (VITAMIN D3) 2000 UNITS TABS Take by mouth. 2000 iu in the am, 1000 iu in the pm   omeprazole (PRILOSEC) 40 MG capsule Take 1 capsule (40 mg total) by mouth daily.   rosuvastatin (CRESTOR) 20 MG tablet TAKE HALF A TABLET (10 MG) BY MOUTH ONCE DAILY.   tamsulosin (FLOMAX) 0.4 MG CAPS capsule Take 1 capsule (0.4 mg total) by mouth daily.   Testosterone 20.25 MG/ACT (1.62%) GEL SMARTSIG:2 Pump Topical Daily   valsartan (DIOVAN) 40 MG tablet Take 1 tablet (40 mg total) by mouth 2 (two) times daily.   vitamin C (ASCORBIC ACID) 500 MG tablet Take 500 mg by mouth 2 (two) times daily.   No facility-administered encounter medications on file as of 01/17/2020.    Current Diagnosis: Patient Active Problem List   Diagnosis Date Noted   Aortic atherosclerosis ([place]) 08/25/2019   Inguinal hernia 08/25/2019   Chronic periumbilical pain 99/24/2683   Neurogenic claudication ([place]) 08/24/2018   Neck pain 03/02/2018   Neuralgia 03/02/2018   Prediabetes 41/96/2229   Umbilical hernia 79/89/2119   Varicose veins of both lower extremities 05/22/2016   Vitamin D deficiency 08/21/2015   Hypertension 01/24/2015   Tingling in  extremities 01/24/2015   Family history of prostate cancer 08/19/2014   Osteopenia 08/07/2009   Hyperlipidemia 08/12/2007   PITUITARY INSUFFICIENCY 08/05/2007   Testosterone deficiency 08/05/2007   Coronary atherosclerosis 08/05/2007   GERD 08/05/2007   BPH without obstruction/lower urinary tract symptoms 08/05/2007    Goals Addressed   None     Follow-Up:  Pharmacist Review    I received a call from Patrick Harris today who stated that when he last talked with Patrick Harris the clinical pharmacist she doubled his valsartan to 40 mg twice daily. The patient states that he has been taking the 40 mg for about 15 days now and he has not seen a change in his blood pressure. The patient states that on 01/13/20 he checked his blood pressure and it was 182/85, 01/16/20 at 3 pm it was 176/85 and today about 2 pm it was 166/82. The patient stated that he was not having any symptoms of dizziness or light headedness but did have a headache. He would like to speak to Patrick Harris to see if there was anything else that can be done to get his blood pressure to come down. I let the patient know that I will pass along the information to the clinical pharmacist Patrick Harris.   Patrick Harris, [place] [phone]

## 2020-01-17 NOTE — Chronic Care Management (AMB) (Signed)
Chronic Care Management Pharmacy  Name: Patrick Harris  MRN: [id number] DOB: Jun 06, 1945   Chief Complaint/ HPI  Patrick Harris,  75 y.o. , male presents for his Follow-Up CCM visit with the clinical pharmacist via telephone.  PCP : [name], MD Patient Care Team: [name], MD as PCP - General (Internal Medicine) Satira Sark, MD as PCP - Cardiology (Cardiology) Ladene Artist, MD as Consulting Physician (Gastroenterology) Anda Kraft, MD as Consulting Physician (Endocrinology) Charlton Haws, [facility] as Pharmacist (Pharmacist)  Patient's chronic conditions include: Hypertension, Hyperlipidemia, Coronary Artery Disease, GERD, Osteopenia and BPH, pituitary insufficiency   Office Visits: 11/22/19 Dr Quay Burow - changed lisinopril back to valsartan due to side effects 08/25/19 Dr Quay Burow OV: pt concerned valsartan causing leg pain, changed to lisinopril  Consult Visit: Dr Buddy Duty (endocrine): f/u regularly for pituitary insufficiency   Subjective: Patient lives with wife and helps care for 10 and 86 year old grandson who recently lost their mother (patient's daughter) this year.  Objective: Allergies  Allergen Reactions  . Latex     Contact  dermatitis  . Lisinopril     Leg pain, inc HR, dizziness, headache  . Amlodipine     Edema   . Hydrochlorothiazide Other (See Comments)    Possibly caused pancreatitis 2018  . Simvastatin     Joint pain   Medications: Outpatient Encounter Medications as of 01/17/2020  Medication Sig  . acetaminophen (TYLENOL) 500 MG tablet Take 500 mg by mouth every 6 (six) hours as needed.  Marland Kitchen aspirin EC 81 MG tablet Take 1 tablet (81 mg total) by mouth at bedtime.  . bromocriptine (PARLODEL) 5 MG capsule Take 15 mg by mouth daily.  . Cholecalciferol (VITAMIN D3) 2000 UNITS TABS Take by mouth. 2000 iu in the am, 1000 iu in the pm  . omeprazole (PRILOSEC) 40 MG capsule Take 1 capsule (40 mg total) by mouth daily.  .  rosuvastatin (CRESTOR) 20 MG tablet TAKE HALF A TABLET (10 MG) BY MOUTH ONCE DAILY.  . tamsulosin (FLOMAX) 0.4 MG CAPS capsule Take 1 capsule (0.4 mg total) by mouth daily.  . Testosterone 20.25 MG/ACT (1.62%) GEL SMARTSIG:2 Pump Topical Daily  . valsartan (DIOVAN) 40 MG tablet Take 1 tablet (40 mg total) by mouth 2 (two) times daily.  . vitamin C (ASCORBIC ACID) 500 MG tablet Take 500 mg by mouth 2 (two) times daily.   No facility-administered encounter medications on file as of 01/17/2020.    Wt Readings from Last 3 Encounters:  08/25/19 189 lb (85.7 kg)  03/29/19 185 lb (83.9 kg)  03/18/19 185 lb 8 oz (84.1 kg)    Lab Results  Component Value Date   CREATININE 1.07 08/25/2019   BUN 13 08/25/2019   GFR 87.01 03/18/2019   GFRNONAA >60 11/15/2016   GFRAA >60 11/15/2016   NA 138 08/25/2019   K 4.6 08/25/2019   CALCIUM 9.3 08/25/2019   CO2 30 08/25/2019     Current Diagnosis/Assessment:    Goals Addressed            This Visit's Progress   . Pharmacy Care Plan       CARE PLAN ENTRY (see longitudinal plan of care for additional care plan information)  Current Barriers:  . Chronic Disease Management support, education, and care coordination needs related to Hypertension and Hyperlipidemia   Hypertension BP Readings from Last 3 Encounters:  08/25/19 126/76  03/29/19 131/60  03/18/19 120/62 .  Pharmacist Clinical  Goal(s): o Over the next 30 days, patient will work with PharmD and providers to maintain BP goal <130/80 . Current regimen:  o Valsartan 40 mg - 2 tab daily . Interventions: o Discussed BP goals and benefits of medications for prevention of heart attack / stroke o Recommend to increase valsartan to 120 mg (3 tab daily) and follow up with cardiologist in 10 days as scheduled . Patient self care activities - Over the next 30 days, patient will: o Check BP daily, document, and provide at future appointments o Ensure daily salt intake < 2300  mg/day  Hyperlipidemia Lab Results  Component Value Date/Time   LDLCALC 71 08/25/2019 08:47 AM .  Pharmacist Clinical Goal(s): o Over the next 30 days, patient will work with PharmD and providers to maintain LDL goal < 70 . Current regimen:  o Rosuvastatin 20 mg daily o Aspirin 81 mg daily . Interventions: o Discussed cholesterol goals and benefits of medications for prevention of heart attack / stroke o Discussed interaction between grapefruit juice and rosuvastatin is not clinically relevant in his case . Patient self care activities - Over the next 30 days, patient will: o Continue current medications  Medication management . Pharmacist Clinical Goal(s): o Over the next 30 days, patient will work with PharmD and providers to maintain optimal medication adherence . [facility] . Interventions o Comprehensive medication review performed. o Continue current medication management strategy . Patient self care activities - Over the next 30 days, patient will: o Focus on medication adherence by fill date o Take medications as prescribed o Report any questions or concerns to PharmD and/or provider(s)  Please see past updates related to this goal by clicking on the "Past Updates" button in the selected goal        Hypertension   BP goal is:  <130/80  Office blood pressures are  BP Readings from Last 3 Encounters:  08/25/19 126/76  03/29/19 131/60  03/18/19 120/62   Patient checks BP at home daily Patient home BP readings are ranging:   On lisinopril 10 mg: 09/15/19   168/80  09/25/19 152/84  10/02/19 178/89 11/21/19 131/78   Changed to valsartan 40 mg 11/27/19: 12/14/19  140/75  12/29/19 174/84  Increased valsartan to 80 mg 12/29/19: 01/13/20  182/85 @ 1pm, 168/78 @ 6pm 01/16/20  168/77 @ 8am, 176/85 @ 3pm 01/17/20 163/81 @ 8am, 166/82 @ 3pm  Patient has failed these meds in the past: lisinopril Patient is currently uncontrolled on the following medications:   . Valsartan 40 mg - 2 tab daily AM  We discussed: pt reports home BP is not much improved after increasing valsartan from 40 to 80 mg. Of note he has 3 different BP cuffs that he rotates through and all give similar results, so cuff malfunction is less likely;   Initially advised patient to set up visit with PCP to evaluate for other causes of high BP, however pt has appt with cardiologist in 10 days and would rather wait for that; in the meantime, pt may benefit from higher dose of valsartan until he can see the cardiologist  Plan  Recommend to increase valsartan to 120 mg daily (40 mg x 3) Follow up with cardiologist as scheduled 01/27/2020   Hyperlipidemia   LDL goal < 70  Last lipids Lab Results  Component Value Date   CHOL 122 08/25/2019   HDL 33 (L) 08/25/2019   LDLCALC 71 08/25/2019   TRIG 98 08/25/2019   CHOLHDL 3.7  08/25/2019   Hepatic Function Latest Ref Rng & Units 08/25/2019 03/18/2019 08/24/2018  Total Protein 6.1 - 8.1 g/dL 7.1 7.2 7.0  Albumin 3.5 - 5.2 g/dL - 4.1 4.3  AST 10 - 35 U/L 18 20 18   ALT 9 - 46 U/L 12 18 15   Alk Phosphatase 39 - 117 U/L - 64 61  Total Bilirubin 0.2 - 1.2 mg/dL 0.7 0.6 0.6  Bilirubin, Direct 0.0 - 0.3 mg/dL - - -    The ASCVD Risk score (Tabor., et al., 2013) failed to calculate for the following reasons:   The valid total cholesterol range is 130 to 320 mg/dL   Patient has failed these meds in past: n/a Patient is currently controlled on the following medications:  . Rosuvastatin 20 mg daily - 1/2 tab daily . Aspirin 81 mg daily  We discussed:  diet and exercise extensively; Cholesterol goals; benefits of statin for ASCVD risk reduction  Plan  Continue current medications and control with diet and exercise  Pituitary insufficiency   Managed per Dr Buddy Duty "Testosterone around 325"  Patient has failed these meds in past: n/a Patient is currently controlled on the following medications:  . Bromocriptine 5 mg - 3 cap daily  ($>100 per month) . Testosterone 1.62% gel  We discussed:  Pt reports bromocriptine is >$147 per month in the donut hole; Per formulary it is Tier 3 ordinarily it should be $47/month. Will attempt tier exception given chronic nature of medication  Plan  Continue current medications  Bromocriptine tier exception  Medication Management   Patient's [facility] pharmacy [facility]:  [id number] [address] [address] [place] [id number]: [phone]: [phone] Uses pill box? No - prefers bottles Pt endorses 100% compliance  We discussed: Current pharmacy is preferred with insurance plan and patient is satisfied with pharmacy services  Plan  Continue current medication management strategy    Follow up: 1 month phone visit  LCharlene Brooke PharmD, BCACP Clinical Pharmacist [phone]

## 2020-01-17 NOTE — Progress Notes (Signed)
    Chronic Care Management Pharmacy Assistant   Name: Patrick Harris  MRN: [id number] DOB: [birth year]  Reason for Encounter: Insurance Call   Per clinical pharmacist I was instructed to call [facility] to pharmacy services to try to obtain an Tier exception for Bromcriptine. The medication is a tier 3 trying to get it dropped to a 1 or 2. Will have to call [facility] at [phone] once lines are free right now the wait time is 20-40 mins      Follow-Up:  Pharmacist Review

## 2020-01-19 DIAGNOSIS — M9903 Segmental and somatic dysfunction of lumbar region: Secondary | ICD-10-CM | POA: Diagnosis not present

## 2020-01-19 DIAGNOSIS — M5442 Lumbago with sciatica, left side: Secondary | ICD-10-CM | POA: Diagnosis not present

## 2020-01-19 DIAGNOSIS — M9902 Segmental and somatic dysfunction of thoracic region: Secondary | ICD-10-CM | POA: Diagnosis not present

## 2020-01-19 DIAGNOSIS — M9901 Segmental and somatic dysfunction of cervical region: Secondary | ICD-10-CM | POA: Diagnosis not present

## 2020-01-19 DIAGNOSIS — M542 Cervicalgia: Secondary | ICD-10-CM | POA: Diagnosis not present

## 2020-01-21 DIAGNOSIS — R531 Weakness: Secondary | ICD-10-CM | POA: Diagnosis not present

## 2020-01-21 DIAGNOSIS — M2569 Stiffness of other specified joint, not elsewhere classified: Secondary | ICD-10-CM | POA: Diagnosis not present

## 2020-01-21 DIAGNOSIS — M542 Cervicalgia: Secondary | ICD-10-CM | POA: Diagnosis not present

## 2020-01-21 DIAGNOSIS — M5442 Lumbago with sciatica, left side: Secondary | ICD-10-CM | POA: Diagnosis not present

## 2020-01-21 DIAGNOSIS — R293 Abnormal posture: Secondary | ICD-10-CM | POA: Diagnosis not present

## 2020-01-25 ENCOUNTER — Telehealth: Payer: Self-pay | Admitting: Internal Medicine

## 2020-01-25 DIAGNOSIS — R293 Abnormal posture: Secondary | ICD-10-CM | POA: Diagnosis not present

## 2020-01-25 DIAGNOSIS — M542 Cervicalgia: Secondary | ICD-10-CM | POA: Diagnosis not present

## 2020-01-25 DIAGNOSIS — M5442 Lumbago with sciatica, left side: Secondary | ICD-10-CM | POA: Diagnosis not present

## 2020-01-25 DIAGNOSIS — M2569 Stiffness of other specified joint, not elsewhere classified: Secondary | ICD-10-CM | POA: Diagnosis not present

## 2020-01-25 DIAGNOSIS — R531 Weakness: Secondary | ICD-10-CM | POA: Diagnosis not present

## 2020-01-25 NOTE — Telephone Encounter (Signed)
Patrick Harris is calling from [employer] physical therapy Faxed over a form to give consent for dry needling Sign form and return  Fax to: [phone]

## 2020-01-26 NOTE — Telephone Encounter (Signed)
Paperwork placed in Dr. Eilleen Kempf folder to sign off on today. Will fax when she completes.

## 2020-01-27 ENCOUNTER — Encounter: Payer: Self-pay | Admitting: Cardiology

## 2020-01-27 ENCOUNTER — Other Ambulatory Visit: Payer: Self-pay

## 2020-01-27 ENCOUNTER — Ambulatory Visit: Payer: Medicare Other | Admitting: Cardiology

## 2020-01-27 VITALS — BP 152/88 | HR 88 | Ht 72.0 in | Wt 187.6 lb

## 2020-01-27 DIAGNOSIS — E782 Mixed hyperlipidemia: Secondary | ICD-10-CM | POA: Diagnosis not present

## 2020-01-27 DIAGNOSIS — M542 Cervicalgia: Secondary | ICD-10-CM | POA: Diagnosis not present

## 2020-01-27 DIAGNOSIS — R531 Weakness: Secondary | ICD-10-CM | POA: Diagnosis not present

## 2020-01-27 DIAGNOSIS — R293 Abnormal posture: Secondary | ICD-10-CM | POA: Diagnosis not present

## 2020-01-27 DIAGNOSIS — Z79899 Other long term (current) drug therapy: Secondary | ICD-10-CM

## 2020-01-27 DIAGNOSIS — I25119 Atherosclerotic heart disease of native coronary artery with unspecified angina pectoris: Secondary | ICD-10-CM

## 2020-01-27 DIAGNOSIS — M2569 Stiffness of other specified joint, not elsewhere classified: Secondary | ICD-10-CM | POA: Diagnosis not present

## 2020-01-27 DIAGNOSIS — M5442 Lumbago with sciatica, left side: Secondary | ICD-10-CM | POA: Diagnosis not present

## 2020-01-27 MED ORDER — NITROGLYCERIN 0.4 MG SL SUBL: 0.4000 mg | SUBLINGUAL_TABLET | SUBLINGUAL | 3 refills | Status: DC | PRN

## 2020-01-27 NOTE — Telephone Encounter (Signed)
Follow up message    Beth from [facility] calling for status of form Patient is there now.

## 2020-01-27 NOTE — Telephone Encounter (Signed)
Faxed this morning and conformation received.

## 2020-01-27 NOTE — Addendum Note (Signed)
Addended by: Berlinda Last on: 01/27/2020 02:33 PM   Modules accepted: Orders

## 2020-01-27 NOTE — Patient Instructions (Signed)
Medication Instructions:  Nitrostat (Nitrogylcerin)- Take as needed for chest pains *If you need a refill on your cardiac medications before your next appointment, please call your pharmacy*   Lab Work: None Today If you have labs (blood work) drawn today and your tests are completely normal, you will receive your results only by: Marland Kitchen MyChart Message (if you have MyChart) OR . A paper copy in the mail If you have any lab test that is abnormal or we need to change your treatment, we will call you to review the results.   Testing/Procedures: None Today   Follow-Up: At [name], you and your health needs are our priority.  As part of our continuing mission to provide you with exceptional heart care, we have created designated Provider Care Teams.  These Care Teams include your primary Cardiologist (physician) and Advanced Practice Providers (APPs -  Physician Assistants and Nurse Practitioners) who all work together to provide you with the care you need, when you need it.  We recommend signing up for the patient portal called "MyChart".  Sign up information is provided on this After Visit Summary.  MyChart is used to connect with patients for Virtual Visits (Telemedicine).  Patients are able to view lab/test results, encounter notes, upcoming appointments, etc.  Non-urgent messages can be sent to your provider as well.   To learn more about what you can do with MyChart, go to [email or web address].    Your next appointment:   12 month(s)  The format for your next appointment:   In Person  Provider:   Rozann Lesches, MD   Other Instructions None Today

## 2020-01-27 NOTE — Progress Notes (Signed)
Cardiology Office Note  Date: 01/27/2020   ID: Patrick Harris, DOB [birth year], MRN [id number]  PCP:  [name], MD  Cardiologist:  Rozann Lesches, MD Electrophysiologist:  None   Chief Complaint  Patient presents with  . Cardiac follow-up    History of Present Illness: Patrick Harris is a 75 y.o. male last seen in January 2021.  He presents for a routine visit.  He does not report any major change in stamina, no recurring angina symptoms.  He tells me that he is undergoing physical therapy for treatment of neck pain and back pain, has seen a neurosurgeon with plan for conservative management at this time.  I reviewed his medications which are outlined below.  He has had more trouble controlling his blood pressure, Diovan has been uptitrated.  Also states that his heart rate was elevated after getting his COVID-19 booster, has settled down but still not back to prior baseline.  I personally reviewed his ECG today which shows sinus rhythm with PACs.  I reviewed interval lab work which is outlined below, his last LDL was 71.  Last ischemic testing was in January 2020 as noted below.  Past Medical History:  Diagnosis Date  . Arthritis   . CAD (coronary artery disease)    CABG with LIMA to LAD and RIMA to RCA August 2000 - Dr. Roxan Hockey  . Cancer ([place])    basal cell  . CHF (congestive heart failure) ([place])   . COPD (chronic obstructive pulmonary disease) ([place])   . Elevated glycated hemoglobin   . Essential hypertension   . GERD (gastroesophageal reflux disease)   . Heart murmur   . Hyperlipidemia   . Hypopituitarism ([place])   . Osteopenia    Dr Wilson Singer  . Pancreatitis   . Peyronie disease   . Testosterone deficiency    Dr Wilson Singer    Past Surgical History:  Procedure Laterality Date  . Bone spur  2006   5th toe bilaterally  . COLONOSCOPY  2007   negative; Dr Sharlett Iles  . CORONARY ARTERY BYPASS GRAFT  2001   2 vessels  . fracture heel  1967   Bil/ due to fall  off ladder  . Fractured calcaneus  1969   bilaterally w/ ankle fusion  . HERNIA REPAIR  1984   right inguinal hernia  . KNEE SURGERY Left   . MENISECTOMY  2007   R medial, Dr. Lorin Mercy  . [place]   left  . UPPER GASTROINTESTINAL ENDOSCOPY  2007   GERD, Dr Sharlett Iles  . VASECTOMY      Current Outpatient Medications  Medication Sig Dispense Refill  . acetaminophen (TYLENOL) 500 MG tablet Take 500 mg by mouth every 6 (six) hours as needed.    Marland Kitchen aspirin EC 81 MG tablet Take 1 tablet (81 mg total) by mouth at bedtime.    . bromocriptine (PARLODEL) 5 MG capsule Take 15 mg by mouth daily.    . Cholecalciferol (VITAMIN D3) 2000 UNITS TABS Take by mouth. 2000 iu in the am, 1000 iu in the pm    . nitroGLYCERIN (NITROSTAT) 0.4 MG SL tablet Place 1 tablet (0.4 mg total) under the tongue every 5 (five) minutes as needed for chest pain. 25 tablet 3  . omeprazole (PRILOSEC) 40 MG capsule Take 1 capsule (40 mg total) by mouth daily. 30 capsule 11  . rosuvastatin (CRESTOR) 20 MG tablet TAKE HALF A TABLET (10 MG) BY MOUTH ONCE DAILY. 45 tablet  3  . tamsulosin (FLOMAX) 0.4 MG CAPS capsule Take 1 capsule (0.4 mg total) by mouth daily. 90 capsule 3  . Testosterone 20.25 MG/ACT (1.62%) GEL SMARTSIG:2 Pump Topical Daily    . valsartan (DIOVAN) 40 MG tablet Take 1 tablet (40 mg total) by mouth 2 (two) times daily. (Patient taking differently: Take 120 mg by mouth 2 (two) times daily.) 180 tablet 1  . vitamin C (ASCORBIC ACID) 500 MG tablet Take 500 mg by mouth 2 (two) times daily.     No current facility-administered medications for this visit.   Allergies:  Latex, Lisinopril, Amlodipine, Hydrochlorothiazide, and Simvastatin   ROS: Chronic back and neck pain.  Physical Exam: VS:  BP (!) 152/88   Pulse 88   Ht 6' (1.829 m)   Wt 187 lb 9.6 oz (85.1 kg)   SpO2 100%   BMI 25.44 kg/m , BMI Body mass index is 25.44 kg/m.  Wt Readings from Last 3 Encounters:  01/27/20 187 lb 9.6 oz (85.1  kg)  08/25/19 189 lb (85.7 kg)  03/29/19 185 lb (83.9 kg)    General: Elderly male, appears comfortable at rest. HEENT: Conjunctiva and lids normal, wearing a mask. Neck: Supple, no elevated JVP or carotid bruits, no thyromegaly. Lungs: Clear to auscultation, nonlabored breathing at rest. Cardiac: Regular rate and rhythm with occasional ectopic beat, no S3 or significant systolic murmur, no pericardial rub. Extremities: No pitting edema.  ECG:  An ECG dated 01/01/2018 was personally reviewed today and demonstrated:  Sinus bradycardia with nonspecific ST changes.  Recent Labwork: 08/25/2019: ALT 12; AST 18; BUN 13; Creat 1.07; Hemoglobin 16.5; Platelets 190; Potassium 4.6; Sodium 138; TSH 2.52     Component Value Date/Time   CHOL 122 08/25/2019 0847   TRIG 98 08/25/2019 0847   HDL 33 (L) 08/25/2019 0847   CHOLHDL 3.7 08/25/2019 0847   VLDL 26.0 08/24/2018 0905   LDLCALC 71 08/25/2019 0847    Other Studies Reviewed Today:  [facility] Adam Myoview 01/15/2018:  There was no ST segment deviation noted during stress.  Defect 1: There is a medium defect of moderate severity present in the mid inferoseptal, mid inferior and apical inferior location.  Findings consistent with probable scar with some degree of soft tissue attenuation contributing to the defects.  This is a low risk study overall. No ischemic zones.  Nuclear stress EF: 48%.  Echocardiogram 01/15/2018: Study Conclusions  - Left ventricle: The cavity size was normal. Wall thickness was increased in a pattern of mild LVH. Systolic function was normal. The estimated ejection fraction was in the range of 55% to 60%. Wall motion was normal; there were no regional wall motion abnormalities. Features are consistent with a pseudonormal left ventricular filling pattern, with concomitant abnormal relaxation and increased filling pressure (grade 2 diastolic dysfunction). Doppler parameters are consistent with  indeterminate ventricular filling pressure. - Aortic valve: Mildly to moderately calcified annulus. Mildly thickened, mildly calcified leaflets. Sclerosis without stenosis. - Left atrium: The atrium was mildly dilated. - Right ventricle: Systolic function was mildly reduced. - Systemic veins: The IVC is dilated with normal respiratory variation. Estimated right atrial pressure is 8 mmHg.  Assessment and Plan:  1. Multivessel CAD status post CABG in 2000.  No definite angina symptoms or change in stamina.  Providing prescription for as needed nitroglycerin.  ECG reviewed.  Otherwise continue aspirin, Diovan, and Crestor.  He has not been on AV nodal blockers over time due to typically low resting heart rate.  Ischemic follow-up  from January 2020 was low risk.  With increasing blood pressure trend requiring up titration of Diovan, keep interval follow-up with PCP.  We discussed warning signs and symptoms that would prompt earlier ischemic testing.  2. Mixed hyperlipidemia on Crestor.  Last LDL 71.  Medication Adjustments/Labs and Tests Ordered: Current medicines are reviewed at length with the patient today.  Concerns regarding medicines are outlined above.   Tests Ordered: No orders of the defined types were placed in this encounter.   Medication Changes: Meds ordered this encounter  Medications  . nitroGLYCERIN (NITROSTAT) 0.4 MG SL tablet    Sig: Place 1 tablet (0.4 mg total) under the tongue every 5 (five) minutes as needed for chest pain.    Dispense:  25 tablet    Refill:  3    Disposition:  Follow up 1 year in the [place] office, sooner if needed.  Signed, Satira Sark, MD, [facility] 01/27/2020 2:30 PM    [place] [facility] [address] [place], [place] [address] Phone: [phone]; Fax: [phone]

## 2020-02-01 DIAGNOSIS — R293 Abnormal posture: Secondary | ICD-10-CM | POA: Diagnosis not present

## 2020-02-01 DIAGNOSIS — M542 Cervicalgia: Secondary | ICD-10-CM | POA: Diagnosis not present

## 2020-02-01 DIAGNOSIS — M2569 Stiffness of other specified joint, not elsewhere classified: Secondary | ICD-10-CM | POA: Diagnosis not present

## 2020-02-01 DIAGNOSIS — M5442 Lumbago with sciatica, left side: Secondary | ICD-10-CM | POA: Diagnosis not present

## 2020-02-01 DIAGNOSIS — R531 Weakness: Secondary | ICD-10-CM | POA: Diagnosis not present

## 2020-02-03 DIAGNOSIS — R531 Weakness: Secondary | ICD-10-CM | POA: Diagnosis not present

## 2020-02-03 DIAGNOSIS — M5442 Lumbago with sciatica, left side: Secondary | ICD-10-CM | POA: Diagnosis not present

## 2020-02-03 DIAGNOSIS — M2569 Stiffness of other specified joint, not elsewhere classified: Secondary | ICD-10-CM | POA: Diagnosis not present

## 2020-02-03 DIAGNOSIS — M542 Cervicalgia: Secondary | ICD-10-CM | POA: Diagnosis not present

## 2020-02-03 DIAGNOSIS — R293 Abnormal posture: Secondary | ICD-10-CM | POA: Diagnosis not present

## 2020-02-08 DIAGNOSIS — M2569 Stiffness of other specified joint, not elsewhere classified: Secondary | ICD-10-CM | POA: Diagnosis not present

## 2020-02-08 DIAGNOSIS — R293 Abnormal posture: Secondary | ICD-10-CM | POA: Diagnosis not present

## 2020-02-08 DIAGNOSIS — M542 Cervicalgia: Secondary | ICD-10-CM | POA: Diagnosis not present

## 2020-02-08 DIAGNOSIS — M5442 Lumbago with sciatica, left side: Secondary | ICD-10-CM | POA: Diagnosis not present

## 2020-02-08 DIAGNOSIS — R531 Weakness: Secondary | ICD-10-CM | POA: Diagnosis not present

## 2020-02-10 DIAGNOSIS — M2569 Stiffness of other specified joint, not elsewhere classified: Secondary | ICD-10-CM | POA: Diagnosis not present

## 2020-02-10 DIAGNOSIS — R531 Weakness: Secondary | ICD-10-CM | POA: Diagnosis not present

## 2020-02-10 DIAGNOSIS — M5442 Lumbago with sciatica, left side: Secondary | ICD-10-CM | POA: Diagnosis not present

## 2020-02-10 DIAGNOSIS — R293 Abnormal posture: Secondary | ICD-10-CM | POA: Diagnosis not present

## 2020-02-10 DIAGNOSIS — M542 Cervicalgia: Secondary | ICD-10-CM | POA: Diagnosis not present

## 2020-02-14 NOTE — Progress Notes (Signed)
Subjective:    Patient ID: Patrick Harris, male    DOB: 06/30/1945, 74 y.o.   MRN: [id number]  HPI The patient is here for an acute visit.  BP issues - lindsey increased her valsartan to 120 mg daily recently and he is here for follow up.    In August did ok on valsartan 40 mg daily - BP was good.   On 9/8 he started lisinopril - BP increased 169/80.  9/9 booster 9/18  152/84 9/25  178/89 11/14  131/78  101  11/20 called - changed back to valsartan 12/3 138/71  91 BP was high - 177/78 - started 120 mg of valsartan It was good for a while  182/81,  1/20   158/76  He started taking 4 valsartan on 1/25 -  Decreased to 3 valsartan  - 170/82  71   2/3  179/73 2/8  182/81   He saw cardiology recently.    He has chronic neck pain - he was not sure it that was affecting his BP.    Medications and allergies reviewed with patient and updated if appropriate.  Patient Active Problem List   Diagnosis Date Noted  . Aortic atherosclerosis ([place]) 08/25/2019  . Inguinal hernia 08/25/2019  . Chronic periumbilical pain 37/62/8315  . Neurogenic claudication ([place]) 08/24/2018  . Neck pain 03/02/2018  . Neuralgia 03/02/2018  . Prediabetes 08/22/2017  . Umbilical hernia 17/61/6073  . Varicose veins of both lower extremities 05/22/2016  . Vitamin D deficiency 08/21/2015  . Hypertension 01/24/2015  . Tingling in extremities 01/24/2015  . Family history of prostate cancer 08/19/2014  . Osteopenia 08/07/2009  . Hyperlipidemia 08/12/2007  . PITUITARY INSUFFICIENCY 08/05/2007  . Testosterone deficiency 08/05/2007  . Coronary atherosclerosis 08/05/2007  . GERD 08/05/2007  . BPH without obstruction/lower urinary tract symptoms 08/05/2007    Current Outpatient Medications on File Prior to Visit  Medication Sig Dispense Refill  . acetaminophen (TYLENOL) 500 MG tablet Take 500 mg by mouth every 6 (six) hours as needed.    Marland Kitchen amoxicillin (AMOXIL) 500 MG capsule Take 500 mg by mouth  3 (three) times daily.    Marland Kitchen aspirin EC 81 MG tablet Take 1 tablet (81 mg total) by mouth at bedtime.    . bromocriptine (PARLODEL) 5 MG capsule Take 15 mg by mouth daily.    . Cholecalciferol (VITAMIN D3) 2000 UNITS TABS Take by mouth. 2000 iu in the am, 1000 iu in the pm    . nitroGLYCERIN (NITROSTAT) 0.4 MG SL tablet Place 1 tablet (0.4 mg total) under the tongue every 5 (five) minutes as needed for chest pain. 25 tablet 3  . omeprazole (PRILOSEC) 40 MG capsule Take 1 capsule (40 mg total) by mouth daily. 30 capsule 11  . rosuvastatin (CRESTOR) 20 MG tablet TAKE HALF A TABLET (10 MG) BY MOUTH ONCE DAILY. 45 tablet 3  . tamsulosin (FLOMAX) 0.4 MG CAPS capsule Take 1 capsule (0.4 mg total) by mouth daily. 90 capsule 3  . Testosterone 20.25 MG/ACT (1.62%) GEL SMARTSIG:2 Pump Topical Daily    . vitamin C (ASCORBIC ACID) 500 MG tablet Take 500 mg by mouth 2 (two) times daily.     No current facility-administered medications on file prior to visit.    Past Medical History:  Diagnosis Date  . Arthritis   . CAD (coronary artery disease)    CABG with LIMA to LAD and RIMA to RCA August 2000 - Dr. Roxan Hockey  . Cancer ([place])  basal cell  . CHF (congestive heart failure) ([place])   . COPD (chronic obstructive pulmonary disease) ([place])   . Elevated glycated hemoglobin   . Essential hypertension   . GERD (gastroesophageal reflux disease)   . Heart murmur   . Hyperlipidemia   . Hypopituitarism ([place])   . Osteopenia    Dr Wilson Singer  . Pancreatitis   . Peyronie disease   . Testosterone deficiency    Dr Wilson Singer    Past Surgical History:  Procedure Laterality Date  . Bone spur  2006   5th toe bilaterally  . COLONOSCOPY  2007   negative; Dr Sharlett Iles  . CORONARY ARTERY BYPASS GRAFT  2001   2 vessels  . fracture heel  1967   Bil/ due to fall off ladder  . Fractured calcaneus  1969   bilaterally w/ ankle fusion  . HERNIA REPAIR  1984   right inguinal hernia  . KNEE SURGERY Left   . MENISECTOMY   2007   R medial, Dr. Lorin Mercy  . [place]   left  . UPPER GASTROINTESTINAL ENDOSCOPY  2007   GERD, Dr Sharlett Iles  . VASECTOMY      Social History   Socioeconomic History  . Marital status: Married    Spouse name: Not on file  . Number of children: 1  . Years of education: Not on file  . Highest education level: Not on file  Occupational History  . Occupation: retired  Tobacco Use  . Smoking status: Former Smoker    Quit date: 01/07/1998    Years since quitting: 22.1  . Smokeless tobacco: Never Used  . Tobacco comment: smoked 1963- 2000, up to 1 ppd  Vaping Use  . Vaping Use: Never used  Substance and Sexual Activity  . Alcohol use: No  . Drug use: No  . Sexual activity: Yes  Other Topics Concern  . Not on file  Social History Narrative  . Not on file   Social Determinants of Health   Financial Resource Strain: Low Risk   . Difficulty of Paying Living Expenses: Not hard at all  Food Insecurity: Not on file  Transportation Needs: Not on file  Physical Activity: Not on file  Stress: Not on file  Social Connections: Not on file    Family History  Problem Relation Age of Onset  . Heart attack Father 28  . Diabetes Mother   . Stroke Mother 10  . Hypertension Mother   . Leukemia Sister   . Colon cancer Paternal Uncle   . Aortic aneurysm Brother        abdominal  . Heart disease Brother   . Atrial fibrillation Brother   . Cancer Maternal Uncle         X3:prostate , renal, bone   . Parkinson's disease Sister   . Stroke Sister   . Stroke Sister   . Stroke Sister   . Atrial fibrillation Sister   . Esophageal cancer Neg Hx   . Rectal cancer Neg Hx   . Stomach cancer Neg Hx     Review of Systems  Constitutional: Negative for fever.  Respiratory: Negative for shortness of breath.   Cardiovascular: Negative for chest pain, palpitations and leg swelling.  Neurological: Positive for headaches (from neck and high BP).       Objective:   Vitals:    02/15/20 1348 02/15/20 1355  BP: (!) 160/98 (!) 172/80  Pulse: (!) 58   Temp: 98.2 F (36.8 C)  SpO2: 96%    BP Readings from Last 3 Encounters:  02/15/20 (!) 172/80  01/27/20 (!) 152/88  08/25/19 126/76   Wt Readings from Last 3 Encounters:  02/15/20 188 lb (85.3 kg)  01/27/20 187 lb 9.6 oz (85.1 kg)  08/25/19 189 lb (85.7 kg)   Body mass index is 25.5 kg/m.   Physical Exam    Constitutional: Appears well-developed and well-nourished. No distress.  Head: Normocephalic and atraumatic.  Neck: Neck supple. No tracheal deviation present. No thyromegaly present.  No cervical lymphadenopathy Cardiovascular: Normal rate, regular rhythm and normal heart sounds.  No murmur heard. No carotid bruit .  No edema Pulmonary/Chest: Effort normal and breath sounds normal. No respiratory distress. No has no wheezes. No rales.  Skin: Skin is warm and dry. Not diaphoretic.  Psychiatric: Normal mood and affect. Behavior is normal.       Assessment & Plan:    See Problem List for Assessment and Plan of chronic medical problems.    This visit occurred during the SARS-CoV-2 public health emergency.  Safety protocols were in place, including screening questions prior to the visit, additional usage of staff PPE, and extensive cleaning of exam room while observing appropriate contact time as indicated for disinfecting solutions.

## 2020-02-15 ENCOUNTER — Encounter: Payer: Self-pay | Admitting: Internal Medicine

## 2020-02-15 ENCOUNTER — Other Ambulatory Visit: Payer: Self-pay

## 2020-02-15 ENCOUNTER — Ambulatory Visit (INDEPENDENT_AMBULATORY_CARE_PROVIDER_SITE_OTHER): Payer: Medicare Other | Admitting: Internal Medicine

## 2020-02-15 VITALS — BP 180/82 | HR 58 | Temp 98.2°F | Ht 72.0 in | Wt 188.0 lb

## 2020-02-15 DIAGNOSIS — R531 Weakness: Secondary | ICD-10-CM | POA: Diagnosis not present

## 2020-02-15 DIAGNOSIS — M542 Cervicalgia: Secondary | ICD-10-CM | POA: Diagnosis not present

## 2020-02-15 DIAGNOSIS — I10 Essential (primary) hypertension: Secondary | ICD-10-CM

## 2020-02-15 DIAGNOSIS — M2569 Stiffness of other specified joint, not elsewhere classified: Secondary | ICD-10-CM | POA: Diagnosis not present

## 2020-02-15 DIAGNOSIS — M5442 Lumbago with sciatica, left side: Secondary | ICD-10-CM | POA: Diagnosis not present

## 2020-02-15 DIAGNOSIS — R293 Abnormal posture: Secondary | ICD-10-CM | POA: Diagnosis not present

## 2020-02-15 MED ORDER — VALSARTAN 160 MG PO TABS: 160.0000 mg | ORAL_TABLET | Freq: Every day | ORAL | 5 refills | Status: DC

## 2020-02-15 MED ORDER — HYDRALAZINE HCL 25 MG PO TABS: 25.0000 mg | ORAL_TABLET | Freq: Three times a day (TID) | ORAL | 2 refills | Status: DC

## 2020-02-15 NOTE — Assessment & Plan Note (Signed)
Chronic Not controlled Increase valsartan to 160 mg daily Start hydralazine 25 mg TID Continue to monitor BP at home Bmp at next visit F/u in 3 weeks

## 2020-02-15 NOTE — Patient Instructions (Addendum)
    Medications changes include :  Start valsartan 160 mg daily,  Start hydralazine 25 mg three times a day.    Your prescription(s) have been submitted to your pharmacy. Please take as directed and contact our office if you believe you are having problem(s) with the medication(s).    Please followup in 3 weeks

## 2020-02-17 ENCOUNTER — Telehealth: Payer: Self-pay | Admitting: Pharmacist

## 2020-02-17 ENCOUNTER — Telehealth: Payer: Medicare Other

## 2020-02-17 DIAGNOSIS — M5442 Lumbago with sciatica, left side: Secondary | ICD-10-CM | POA: Diagnosis not present

## 2020-02-17 DIAGNOSIS — M2569 Stiffness of other specified joint, not elsewhere classified: Secondary | ICD-10-CM | POA: Diagnosis not present

## 2020-02-17 DIAGNOSIS — R293 Abnormal posture: Secondary | ICD-10-CM | POA: Diagnosis not present

## 2020-02-17 DIAGNOSIS — R531 Weakness: Secondary | ICD-10-CM | POA: Diagnosis not present

## 2020-02-17 DIAGNOSIS — M542 Cervicalgia: Secondary | ICD-10-CM | POA: Diagnosis not present

## 2020-02-17 NOTE — Progress Notes (Signed)
Chronic Care Management Pharmacy Assistant   Name: Patrick Harris  MRN: [id number] DOB: 1945/10/08  Reason for Encounter: Disease State  PCP : [name], MD  Allergies:   Allergies  Allergen Reactions  . Latex     Contact  dermatitis  . Lisinopril     Leg pain, inc HR, dizziness, headache  . Amlodipine     Edema   . Hydrochlorothiazide Other (See Comments)    Possibly caused pancreatitis 2018  . Simvastatin     Joint pain    Medications: Outpatient Encounter Medications as of 02/17/2020  Medication Sig  . acetaminophen (TYLENOL) 500 MG tablet Take 500 mg by mouth every 6 (six) hours as needed.  Marland Kitchen amoxicillin (AMOXIL) 500 MG capsule Take 500 mg by mouth 3 (three) times daily.  Marland Kitchen aspirin EC 81 MG tablet Take 1 tablet (81 mg total) by mouth at bedtime.  . bromocriptine (PARLODEL) 5 MG capsule Take 15 mg by mouth daily.  . Cholecalciferol (VITAMIN D3) 2000 UNITS TABS Take by mouth. 2000 iu in the am, 1000 iu in the pm  . hydrALAZINE (APRESOLINE) 25 MG tablet Take 1 tablet (25 mg total) by mouth 3 (three) times daily.  . nitroGLYCERIN (NITROSTAT) 0.4 MG SL tablet Place 1 tablet (0.4 mg total) under the tongue every 5 (five) minutes as needed for chest pain.  Marland Kitchen omeprazole (PRILOSEC) 40 MG capsule Take 1 capsule (40 mg total) by mouth daily.  . rosuvastatin (CRESTOR) 20 MG tablet TAKE HALF A TABLET (10 MG) BY MOUTH ONCE DAILY.  . tamsulosin (FLOMAX) 0.4 MG CAPS capsule Take 1 capsule (0.4 mg total) by mouth daily.  . Testosterone 20.25 MG/ACT (1.62%) GEL SMARTSIG:2 Pump Topical Daily  . valsartan (DIOVAN) 160 MG tablet Take 1 tablet (160 mg total) by mouth daily.  . vitamin C (ASCORBIC ACID) 500 MG tablet Take 500 mg by mouth 2 (two) times daily.   No facility-administered encounter medications on file as of 02/17/2020.    Current Diagnosis: Patient Active Problem List   Diagnosis Date Noted  . Aortic atherosclerosis ([place]) 08/25/2019  . Inguinal hernia 08/25/2019   . Chronic periumbilical pain 76/16/0737  . Neurogenic claudication ([place]) 08/24/2018  . Neck pain 03/02/2018  . Neuralgia 03/02/2018  . Prediabetes 08/22/2017  . Umbilical hernia 10/62/6948  . Varicose veins of both lower extremities 05/22/2016  . Vitamin D deficiency 08/21/2015  . Hypertension 01/24/2015  . Tingling in extremities 01/24/2015  . Family history of prostate cancer 08/19/2014  . Osteopenia 08/07/2009  . Hyperlipidemia 08/12/2007  . PITUITARY INSUFFICIENCY 08/05/2007  . Testosterone deficiency 08/05/2007  . Coronary atherosclerosis 08/05/2007  . GERD 08/05/2007  . BPH without obstruction/lower urinary tract symptoms 08/05/2007    Reviewed chart prior to disease state call. Spoke with patient regarding BP  Recent Office Vitals: BP Readings from Last 3 Encounters:  02/15/20 (!) 180/82  01/27/20 (!) 152/88  08/25/19 126/76   Pulse Readings from Last 3 Encounters:  02/15/20 (!) 58  01/27/20 88  08/25/19 (!) 55    Wt Readings from Last 3 Encounters:  02/15/20 188 lb (85.3 kg)  01/27/20 187 lb 9.6 oz (85.1 kg)  08/25/19 189 lb (85.7 kg)     Kidney Function Lab Results  Component Value Date/Time   CREATININE 1.07 08/25/2019 08:47 AM   CREATININE 0.90 05/05/2019 08:34 AM   CREATININE 0.86 03/18/2019 09:14 AM   GFR 87.01 03/18/2019 09:14 AM   GFRNONAA >60 11/15/2016 02:10 PM  GFRAA >60 11/15/2016 02:10 PM    BMP Latest Ref Rng & Units 08/25/2019 05/05/2019 03/18/2019  Glucose 65 - 99 mg/dL 97 - 107(H)  BUN 7 - 25 mg/dL 13 - 17  Creatinine 0.70 - 1.18 mg/dL 1.07 0.90 0.86  BUN/Creat Ratio 6 - 22 (calc) NOT APPLICABLE - -  Sodium 003 - 146 mmol/L 138 - 137  Potassium 3.5 - 5.3 mmol/L 4.6 - 4.1  Chloride 98 - 110 mmol/L 103 - 106  CO2 20 - 32 mmol/L 30 - 25  Calcium 8.6 - 10.3 mg/dL 9.3 - 9.6      . Current antihypertensive regimen:                 - hydrALAZINE (APRESOLINE) 25 MG tablet               -  valsartan (DIOVAN) 160 MG tablet (Patient  stated he just started this medication two days ago ) . How often are you checking your Blood Pressure? twice daily . Current home BP readings: 146/75, 165/74, 162/83 HR-84 ,  . What recent interventions/DTPs have been made by any provider to improve Blood Pressure control since last CPP Visit: 02/15/20 Patient was started on valsartan 160mg  daily and hydralazine 25mg  three times a day . Marland Kitchen Any recent hospitalizations or ED visits since last visit with CPP? No . What diet changes have been made to improve Blood Pressure Control?  o Patient stated no changes . What exercise is being done to improve your Blood Pressure Control?              Patient stated he has a disc in his neck and has been doing therapy every day for 45 minutes and he also does the elliptical bike daily.   Adherence Review: Is the patient currently on ACE/ARB medication? Yes Does the patient have >5 day gap between last estimated fill dates? CPP to review   [place] ,Agency Pharmacist Assistant [phone]  Follow-Up:  Pharmacist Review

## 2020-02-22 DIAGNOSIS — M5442 Lumbago with sciatica, left side: Secondary | ICD-10-CM | POA: Diagnosis not present

## 2020-02-22 DIAGNOSIS — M2569 Stiffness of other specified joint, not elsewhere classified: Secondary | ICD-10-CM | POA: Diagnosis not present

## 2020-02-22 DIAGNOSIS — R293 Abnormal posture: Secondary | ICD-10-CM | POA: Diagnosis not present

## 2020-02-22 DIAGNOSIS — R531 Weakness: Secondary | ICD-10-CM | POA: Diagnosis not present

## 2020-02-22 DIAGNOSIS — M542 Cervicalgia: Secondary | ICD-10-CM | POA: Diagnosis not present

## 2020-02-24 DIAGNOSIS — M542 Cervicalgia: Secondary | ICD-10-CM | POA: Diagnosis not present

## 2020-02-24 DIAGNOSIS — R293 Abnormal posture: Secondary | ICD-10-CM | POA: Diagnosis not present

## 2020-02-24 DIAGNOSIS — R3912 Poor urinary stream: Secondary | ICD-10-CM | POA: Diagnosis not present

## 2020-02-24 DIAGNOSIS — M5442 Lumbago with sciatica, left side: Secondary | ICD-10-CM | POA: Diagnosis not present

## 2020-02-24 DIAGNOSIS — R531 Weakness: Secondary | ICD-10-CM | POA: Diagnosis not present

## 2020-02-24 DIAGNOSIS — M2569 Stiffness of other specified joint, not elsewhere classified: Secondary | ICD-10-CM | POA: Diagnosis not present

## 2020-02-29 DIAGNOSIS — M542 Cervicalgia: Secondary | ICD-10-CM | POA: Diagnosis not present

## 2020-02-29 DIAGNOSIS — R293 Abnormal posture: Secondary | ICD-10-CM | POA: Diagnosis not present

## 2020-02-29 DIAGNOSIS — M5442 Lumbago with sciatica, left side: Secondary | ICD-10-CM | POA: Diagnosis not present

## 2020-02-29 DIAGNOSIS — R531 Weakness: Secondary | ICD-10-CM | POA: Diagnosis not present

## 2020-02-29 DIAGNOSIS — M2569 Stiffness of other specified joint, not elsewhere classified: Secondary | ICD-10-CM | POA: Diagnosis not present

## 2020-03-05 NOTE — Patient Instructions (Addendum)
  Blood work was ordered.     Medications changes include :   Increase Hydralazine to 50 mg three times a day  Your prescription(s) have been submitted to your pharmacy. Please take as directed and contact our office if you believe you are having problem(s) with the medication(s).    Please followup in 3 weeks

## 2020-03-05 NOTE — Progress Notes (Signed)
Subjective:    Patient ID: Patrick Harris, male    DOB: 1945-07-06, 75 y.o.   MRN: [id number]  HPI The patient is here for follow up of their chronic medical problems, including htn   3 weeks ago we increase the valsartan to 160 mg daily and started hydralazine 25 mg TID.   BP at home - 150-160's/ 60's  He denies side effects.  He is watching the salt in his diet.   Medications and allergies reviewed with patient and updated if appropriate.  Patient Active Problem List   Diagnosis Date Noted  . Aortic atherosclerosis ([place]) 08/25/2019  . Inguinal hernia 08/25/2019  . Chronic periumbilical pain 10/93/2355  . Neurogenic claudication ([place]) 08/24/2018  . Neck pain 03/02/2018  . Neuralgia 03/02/2018  . Prediabetes 08/22/2017  . Umbilical hernia 73/22/0254  . Varicose veins of both lower extremities 05/22/2016  . Vitamin D deficiency 08/21/2015  . Hypertension 01/24/2015  . Tingling in extremities 01/24/2015  . Family history of prostate cancer 08/19/2014  . Osteopenia 08/07/2009  . Hyperlipidemia 08/12/2007  . PITUITARY INSUFFICIENCY 08/05/2007  . Testosterone deficiency 08/05/2007  . Coronary atherosclerosis 08/05/2007  . GERD 08/05/2007  . BPH without obstruction/lower urinary tract symptoms 08/05/2007    Current Outpatient Medications on File Prior to Visit  Medication Sig Dispense Refill  . acetaminophen (TYLENOL) 500 MG tablet Take 500 mg by mouth every 6 (six) hours as needed.    Marland Kitchen amoxicillin (AMOXIL) 500 MG capsule Take 500 mg by mouth 3 (three) times daily.    Marland Kitchen aspirin EC 81 MG tablet Take 1 tablet (81 mg total) by mouth at bedtime.    . bromocriptine (PARLODEL) 5 MG capsule Take 15 mg by mouth daily.    . Cholecalciferol (VITAMIN D3) 2000 UNITS TABS Take by mouth. 2000 iu in the am, 1000 iu in the pm    . hydrALAZINE (APRESOLINE) 25 MG tablet Take 1 tablet (25 mg total) by mouth 3 (three) times daily. 90 tablet 2  . nitroGLYCERIN (NITROSTAT) 0.4 MG SL  tablet Place 1 tablet (0.4 mg total) under the tongue every 5 (five) minutes as needed for chest pain. 25 tablet 3  . omeprazole (PRILOSEC) 40 MG capsule Take 1 capsule (40 mg total) by mouth daily. 30 capsule 11  . rosuvastatin (CRESTOR) 20 MG tablet TAKE HALF A TABLET (10 MG) BY MOUTH ONCE DAILY. 45 tablet 3  . tamsulosin (FLOMAX) 0.4 MG CAPS capsule Take 1 capsule (0.4 mg total) by mouth daily. 90 capsule 3  . Testosterone 20.25 MG/ACT (1.62%) GEL SMARTSIG:2 Pump Topical Daily    . valsartan (DIOVAN) 160 MG tablet Take 1 tablet (160 mg total) by mouth daily. 30 tablet 5  . vitamin C (ASCORBIC ACID) 500 MG tablet Take 500 mg by mouth 2 (two) times daily.    . finasteride (PROSCAR) 5 MG tablet Take 5 mg by mouth daily.     No current facility-administered medications on file prior to visit.    Past Medical History:  Diagnosis Date  . Arthritis   . CAD (coronary artery disease)    CABG with LIMA to LAD and RIMA to RCA August 2000 - Dr. Roxan Hockey  . Cancer ([place])    basal cell  . CHF (congestive heart failure) ([place])   . COPD (chronic obstructive pulmonary disease) ([place])   . Elevated glycated hemoglobin   . Essential hypertension   . GERD (gastroesophageal reflux disease)   . Heart murmur   . Hyperlipidemia   .  Hypopituitarism ([place])   . Osteopenia    Dr Wilson Singer  . Pancreatitis   . Peyronie disease   . Testosterone deficiency    Dr Wilson Singer    Past Surgical History:  Procedure Laterality Date  . Bone spur  2006   5th toe bilaterally  . COLONOSCOPY  2007   negative; Dr Sharlett Iles  . CORONARY ARTERY BYPASS GRAFT  2001   2 vessels  . fracture heel  1967   Bil/ due to fall off ladder  . Fractured calcaneus  1969   bilaterally w/ ankle fusion  . HERNIA REPAIR  1984   right inguinal hernia  . KNEE SURGERY Left   . MENISECTOMY  2007   R medial, Dr. Lorin Mercy  . [place]   left  . UPPER GASTROINTESTINAL ENDOSCOPY  2007   GERD, Dr Sharlett Iles  . VASECTOMY      Social  History   Socioeconomic History  . Marital status: Married    Spouse name: Not on file  . Number of children: 1  . Years of education: Not on file  . Highest education level: Not on file  Occupational History  . Occupation: retired  Tobacco Use  . Smoking status: Former Smoker    Quit date: 01/07/1998    Years since quitting: 22.1  . Smokeless tobacco: Never Used  . Tobacco comment: smoked 1963- 2000, up to 1 ppd  Vaping Use  . Vaping Use: Never used  Substance and Sexual Activity  . Alcohol use: No  . Drug use: No  . Sexual activity: Yes  Other Topics Concern  . Not on file  Social History Narrative  . Not on file   Social Determinants of Health   Financial Resource Strain: Low Risk   . Difficulty of Paying Living Expenses: Not hard at all  Food Insecurity: Not on file  Transportation Needs: Not on file  Physical Activity: Not on file  Stress: Not on file  Social Connections: Not on file    Family History  Problem Relation Age of Onset  . Heart attack Father 55  . Diabetes Mother   . Stroke Mother 41  . Hypertension Mother   . Leukemia Sister   . Colon cancer Paternal Uncle   . Aortic aneurysm Brother        abdominal  . Heart disease Brother   . Atrial fibrillation Brother   . Cancer Maternal Uncle         X3:prostate , renal, bone   . Parkinson's disease Sister   . Stroke Sister   . Stroke Sister   . Stroke Sister   . Atrial fibrillation Sister   . Esophageal cancer Neg Hx   . Rectal cancer Neg Hx   . Stomach cancer Neg Hx     Review of Systems  Constitutional: Negative for fever.  Respiratory: Negative for shortness of breath.   Cardiovascular: Negative for chest pain, palpitations and leg swelling.  Neurological: Negative for dizziness, light-headedness and headaches.       Objective:   Vitals:   03/06/20 1505  BP: (!) 150/70  Pulse: 60  Temp: 98.3 F (36.8 C)  SpO2: 96%   BP Readings from Last 3 Encounters:  03/06/20 (!) 150/70   02/15/20 (!) 180/82  01/27/20 (!) 152/88   Wt Readings from Last 3 Encounters:  03/06/20 191 lb 12.8 oz (87 kg)  02/15/20 188 lb (85.3 kg)  01/27/20 187 lb 9.6 oz (85.1 kg)   Body  mass index is 26.01 kg/m.   Physical Exam    Constitutional: Appears well-developed and well-nourished. No distress.  HENT:  Head: Normocephalic and atraumatic.  Neck: Neck supple. No tracheal deviation present. No thyromegaly present.  No cervical lymphadenopathy Cardiovascular: Normal rate, regular rhythm and normal heart sounds.   No murmur heard. No carotid bruit .  No edema Pulmonary/Chest: Effort normal and breath sounds normal. No respiratory distress. No has no wheezes. No rales.  Skin: Skin is warm and dry. Not diaphoretic.  Psychiatric: Normal mood and affect. Behavior is normal.      Assessment & Plan:    See Problem List for Assessment and Plan of chronic medical problems.    This visit occurred during the SARS-CoV-2 public health emergency.  Safety protocols were in place, including screening questions prior to the visit, additional usage of staff PPE, and extensive cleaning of exam room while observing appropriate contact time as indicated for disinfecting solutions.

## 2020-03-06 ENCOUNTER — Other Ambulatory Visit: Payer: Self-pay

## 2020-03-06 ENCOUNTER — Ambulatory Visit (INDEPENDENT_AMBULATORY_CARE_PROVIDER_SITE_OTHER): Payer: Medicare Other | Admitting: Internal Medicine

## 2020-03-06 ENCOUNTER — Telehealth: Payer: Self-pay | Admitting: Pharmacist

## 2020-03-06 ENCOUNTER — Encounter: Payer: Self-pay | Admitting: Internal Medicine

## 2020-03-06 VITALS — BP 150/70 | HR 60 | Temp 98.3°F | Ht 72.0 in | Wt 191.8 lb

## 2020-03-06 DIAGNOSIS — I10 Essential (primary) hypertension: Secondary | ICD-10-CM

## 2020-03-06 MED ORDER — HYDRALAZINE HCL 50 MG PO TABS: 50.0000 mg | ORAL_TABLET | Freq: Three times a day (TID) | ORAL | 5 refills | Status: DC

## 2020-03-06 NOTE — Assessment & Plan Note (Signed)
Chronic Blood pressure is not ideally controlled Continue valsartan 160 mg daily Increase hydralazine to 50 mg three times daily He will continue to monitor his blood pressure at home Follow-up in 3 weeks, sooner if needed

## 2020-03-06 NOTE — Progress Notes (Signed)
    Chronic Care Management Pharmacy Assistant   Name: Patrick Harris  MRN: [id number] DOB: [birth year]  Reason for Encounter: Chart Review   PCP : [name], MD  Allergies:   Allergies  Allergen Reactions  . Latex     Contact  dermatitis  . Lisinopril     Leg pain, inc HR, dizziness, headache  . Amlodipine     Edema   . Hydrochlorothiazide Other (See Comments)    Possibly caused pancreatitis 2018  . Simvastatin     Joint pain    Medications: Outpatient Encounter Medications as of 03/06/2020  Medication Sig  . acetaminophen (TYLENOL) 500 MG tablet Take 500 mg by mouth every 6 (six) hours as needed.  Marland Kitchen amoxicillin (AMOXIL) 500 MG capsule Take 500 mg by mouth 3 (three) times daily.  Marland Kitchen aspirin EC 81 MG tablet Take 1 tablet (81 mg total) by mouth at bedtime.  . bromocriptine (PARLODEL) 5 MG capsule Take 15 mg by mouth daily.  . Cholecalciferol (VITAMIN D3) 2000 UNITS TABS Take by mouth. 2000 iu in the am, 1000 iu in the pm  . finasteride (PROSCAR) 5 MG tablet Take 5 mg by mouth daily.  . hydrALAZINE (APRESOLINE) 50 MG tablet Take 1 tablet (50 mg total) by mouth 3 (three) times daily.  . nitroGLYCERIN (NITROSTAT) 0.4 MG SL tablet Place 1 tablet (0.4 mg total) under the tongue every 5 (five) minutes as needed for chest pain.  Marland Kitchen omeprazole (PRILOSEC) 40 MG capsule Take 1 capsule (40 mg total) by mouth daily.  . rosuvastatin (CRESTOR) 20 MG tablet TAKE HALF A TABLET (10 MG) BY MOUTH ONCE DAILY.  . tamsulosin (FLOMAX) 0.4 MG CAPS capsule Take 1 capsule (0.4 mg total) by mouth daily.  . Testosterone 20.25 MG/ACT (1.62%) GEL SMARTSIG:2 Pump Topical Daily  . valsartan (DIOVAN) 160 MG tablet Take 1 tablet (160 mg total) by mouth daily.  . vitamin C (ASCORBIC ACID) 500 MG tablet Take 500 mg by mouth 2 (two) times daily.   No facility-administered encounter medications on file as of 03/06/2020.    Current Diagnosis: Patient Active Problem List   Diagnosis Date Noted  . Aortic  atherosclerosis ([place]) 08/25/2019  . Inguinal hernia 08/25/2019  . Chronic periumbilical pain 88/41/6606  . Neurogenic claudication ([place]) 08/24/2018  . Neck pain 03/02/2018  . Neuralgia 03/02/2018  . Prediabetes 08/22/2017  . Umbilical hernia 30/16/0109  . Varicose veins of both lower extremities 05/22/2016  . Vitamin D deficiency 08/21/2015  . Hypertension 01/24/2015  . Tingling in extremities 01/24/2015  . Family history of prostate cancer 08/19/2014  . Osteopenia 08/07/2009  . Hyperlipidemia 08/12/2007  . PITUITARY INSUFFICIENCY 08/05/2007  . Testosterone deficiency 08/05/2007  . Coronary atherosclerosis 08/05/2007  . GERD 08/05/2007  . BPH without obstruction/lower urinary tract symptoms 08/05/2007    Goals Addressed   None     Follow-Up:  Pharmacist Review   Reviewed chart for medication changes and adherence.  Patient saw Dr. Quay Burow on 03/06/20 since last care coordination call with clinical pharmacist, medications where changed  No gaps in adherence identified. Patient has follow up scheduled with pharmacy team. No further action required.    Wendy Poet, [place] [phone]

## 2020-03-08 DIAGNOSIS — M479 Spondylosis, unspecified: Secondary | ICD-10-CM | POA: Diagnosis not present

## 2020-03-08 DIAGNOSIS — M542 Cervicalgia: Secondary | ICD-10-CM | POA: Diagnosis not present

## 2020-03-08 DIAGNOSIS — M545 Low back pain, unspecified: Secondary | ICD-10-CM | POA: Diagnosis not present

## 2020-03-10 ENCOUNTER — Telehealth: Payer: Self-pay | Admitting: Internal Medicine

## 2020-03-10 NOTE — Telephone Encounter (Signed)
    Patient calling with questions regarding BP  Patient is concerned that 131/61 is too low

## 2020-03-10 NOTE — Telephone Encounter (Signed)
Spoke with patient today. 

## 2020-03-22 DIAGNOSIS — M4722 Other spondylosis with radiculopathy, cervical region: Secondary | ICD-10-CM | POA: Diagnosis not present

## 2020-03-26 NOTE — Patient Instructions (Addendum)
  Blood work was ordered.     Medications changes include :   Decrease hydralazine to 25 mg three times a day.  Your prescription(s) have been submitted to your pharmacy. Please take as directed and contact our office if you believe you are having problem(s) with the medication(s).     Please followup in 6 months

## 2020-03-26 NOTE — Progress Notes (Signed)
Subjective:    Patient ID: Patrick Harris, male    DOB: [birth year], 75 y.o.   MRN: [id number]  HPI The patient is here for follow up of their chronic medical problems, including hypertension  We increased the hydralazine to 50 mg TID three weeks ago.     BP at home --  142/72,  172/80    3/1                          134/55        3/17                           134/54        3/19   134/66        150/65   139/65   46     135/62   81  - today at home  HR 50-80  When his DBP gets low 60's or 50's he gets a burning sensation in his chest and SOB. This occurs at rest - that is when his BP drops.  His BP elevates when he gets up and moves around and he does not feel that.    Medications and allergies reviewed with patient and updated if appropriate.  Patient Active Problem List   Diagnosis Date Noted  . Aortic atherosclerosis ([place]) 08/25/2019  . Inguinal hernia 08/25/2019  . Chronic periumbilical pain 61/95/0932  . Neurogenic claudication ([place]) 08/24/2018  . Neck pain 03/02/2018  . Neuralgia 03/02/2018  . Prediabetes 08/22/2017  . Umbilical hernia 67/12/4578  . Varicose veins of both lower extremities 05/22/2016  . Vitamin D deficiency 08/21/2015  . Hypertension 01/24/2015  . Tingling in extremities 01/24/2015  . Family history of prostate cancer 08/19/2014  . Osteopenia 08/07/2009  . Hyperlipidemia 08/12/2007  . PITUITARY INSUFFICIENCY 08/05/2007  . Testosterone deficiency 08/05/2007  . Coronary atherosclerosis 08/05/2007  . GERD 08/05/2007  . BPH without obstruction/lower urinary tract symptoms 08/05/2007    Current Outpatient Medications on File Prior to Visit  Medication Sig Dispense Refill  . acetaminophen (TYLENOL) 500 MG tablet Take 500 mg by mouth every 6 (six) hours as needed.    Marland Kitchen aspirin EC 81 MG tablet Take 1 tablet (81 mg total) by mouth at bedtime.    . bromocriptine (PARLODEL) 5 MG capsule Take 15 mg by mouth daily.    . celecoxib (CELEBREX) 200 MG  capsule Take 200 mg by mouth daily as needed.    . Cholecalciferol (VITAMIN D3) 2000 UNITS TABS Take by mouth. 2000 iu in the am, 1000 iu in the pm    . finasteride (PROSCAR) 5 MG tablet Take 5 mg by mouth daily.    Marland Kitchen FLUZONE HIGH-DOSE QUADRIVALENT 0.7 ML SUSY     . gabapentin (NEURONTIN) 300 MG capsule Take 300 mg by mouth at bedtime.    . hydrALAZINE (APRESOLINE) 50 MG tablet Take 1 tablet (50 mg total) by mouth 3 (three) times daily. 90 tablet 5  . nitroGLYCERIN (NITROSTAT) 0.4 MG SL tablet Place 1 tablet (0.4 mg total) under the tongue every 5 (five) minutes as needed for chest pain. 25 tablet 3  . omeprazole (PRILOSEC) 40 MG capsule Take 1 capsule (40 mg total) by mouth daily. 30 capsule 11  . rosuvastatin (CRESTOR) 20 MG tablet TAKE HALF A TABLET (10 MG) BY MOUTH ONCE DAILY. 45 tablet 3  . tamsulosin (FLOMAX) 0.4 MG CAPS capsule  Take 0.4 mg by mouth 2 (two) times daily.    . Testosterone 20.25 MG/ACT (1.62%) GEL SMARTSIG:2 Pump Topical Daily    . valsartan (DIOVAN) 160 MG tablet Take 1 tablet (160 mg total) by mouth daily. 30 tablet 5  . vitamin C (ASCORBIC ACID) 500 MG tablet Take 500 mg by mouth 2 (two) times daily.    . tamsulosin (FLOMAX) 0.4 MG CAPS capsule Take 1 capsule (0.4 mg total) by mouth daily. (Patient not taking: Reported on 03/27/2020) 90 capsule 3   No current facility-administered medications on file prior to visit.    Past Medical History:  Diagnosis Date  . Arthritis   . CAD (coronary artery disease)    CABG with LIMA to LAD and RIMA to RCA August 2000 - Dr. Roxan Hockey  . Cancer ([place])    basal cell  . CHF (congestive heart failure) ([place])   . COPD (chronic obstructive pulmonary disease) ([place])   . Elevated glycated hemoglobin   . Essential hypertension   . GERD (gastroesophageal reflux disease)   . Heart murmur   . Hyperlipidemia   . Hypopituitarism ([place])   . Osteopenia    Dr Wilson Singer  . Pancreatitis   . Peyronie disease   . Testosterone deficiency    Dr  Wilson Singer    Past Surgical History:  Procedure Laterality Date  . Bone spur  2006   5th toe bilaterally  . COLONOSCOPY  2007   negative; Dr Sharlett Iles  . CORONARY ARTERY BYPASS GRAFT  2001   2 vessels  . fracture heel  1967   Bil/ due to fall off ladder  . Fractured calcaneus  1969   bilaterally w/ ankle fusion  . HERNIA REPAIR  1984   right inguinal hernia  . KNEE SURGERY Left   . MENISECTOMY  2007   R medial, Dr. Lorin Mercy  . [place]   left  . UPPER GASTROINTESTINAL ENDOSCOPY  2007   GERD, Dr Sharlett Iles  . VASECTOMY      Social History   Socioeconomic History  . Marital status: Married    Spouse name: Not on file  . Number of children: 1  . Years of education: Not on file  . Highest education level: Not on file  Occupational History  . Occupation: retired  Tobacco Use  . Smoking status: Former Smoker    Quit date: 01/07/1998    Years since quitting: 22.2  . Smokeless tobacco: Never Used  . Tobacco comment: smoked 1963- 2000, up to 1 ppd  Vaping Use  . Vaping Use: Never used  Substance and Sexual Activity  . Alcohol use: No  . Drug use: No  . Sexual activity: Yes  Other Topics Concern  . Not on file  Social History Narrative  . Not on file   Social Determinants of Health   Financial Resource Strain: Low Risk   . Difficulty of Paying Living Expenses: Not hard at all  Food Insecurity: Not on file  Transportation Needs: Not on file  Physical Activity: Not on file  Stress: Not on file  Social Connections: Not on file    Family History  Problem Relation Age of Onset  . Heart attack Father 36  . Diabetes Mother   . Stroke Mother 28  . Hypertension Mother   . Leukemia Sister   . Colon cancer Paternal Uncle   . Aortic aneurysm Brother        abdominal  . Heart disease Brother   .  Atrial fibrillation Brother   . Cancer Maternal Uncle         X3:prostate , renal, bone   . Parkinson's disease Sister   . Stroke Sister   . Stroke Sister   .  Stroke Sister   . Atrial fibrillation Sister   . Esophageal cancer Neg Hx   . Rectal cancer Neg Hx   . Stomach cancer Neg Hx     Review of Systems  Constitutional: Negative for fever.  Respiratory: Positive for shortness of breath (with DBP on low side).   Cardiovascular: Positive for chest pain (with DBP on low side). Negative for palpitations.  Neurological: Positive for light-headedness (with DBP on low side). Negative for headaches.       Objective:   Vitals:   03/27/20 1028  BP: 140/78  Pulse: 67  Temp: 98.3 F (36.8 C)  SpO2: 95%   BP Readings from Last 3 Encounters:  03/27/20 140/78  03/06/20 (!) 150/70  02/15/20 (!) 180/82   Wt Readings from Last 3 Encounters:  03/27/20 191 lb (86.6 kg)  03/06/20 191 lb 12.8 oz (87 kg)  02/15/20 188 lb (85.3 kg)   Body mass index is 25.9 kg/m.   Physical Exam    Constitutional: Appears well-developed and well-nourished. No distress.  HENT:  Head: Normocephalic and atraumatic.  Cardiovascular: Normal rate, regular rhythm and normal heart sounds.   No murmur heard. No carotid bruit .  No edema Pulmonary/Chest: Effort normal and breath sounds normal. No respiratory distress. No has no wheezes. No rales.  Skin: Skin is warm and dry. Not diaphoretic.  Psychiatric: Normal mood and affect. Behavior is normal.      Assessment & Plan:    See Problem List for Assessment and Plan of chronic medical problems.    This visit occurred during the SARS-CoV-2 public health emergency.  Safety protocols were in place, including screening questions prior to the visit, additional usage of staff PPE, and extensive cleaning of exam room while observing appropriate contact time as indicated for disinfecting solutions.

## 2020-03-27 ENCOUNTER — Encounter: Payer: Self-pay | Admitting: Internal Medicine

## 2020-03-27 ENCOUNTER — Other Ambulatory Visit: Payer: Self-pay

## 2020-03-27 ENCOUNTER — Ambulatory Visit (INDEPENDENT_AMBULATORY_CARE_PROVIDER_SITE_OTHER): Payer: Medicare Other | Admitting: Internal Medicine

## 2020-03-27 VITALS — BP 140/78 | HR 67 | Temp 98.3°F | Ht 72.0 in | Wt 191.0 lb

## 2020-03-27 DIAGNOSIS — I10 Essential (primary) hypertension: Secondary | ICD-10-CM | POA: Diagnosis not present

## 2020-03-27 DIAGNOSIS — M542 Cervicalgia: Secondary | ICD-10-CM

## 2020-03-27 LAB — COMPREHENSIVE METABOLIC PANEL
ALT: 17 U/L (ref 0–53)
AST: 17 U/L (ref 0–37)
Albumin: 4.4 g/dL (ref 3.5–5.2)
Alkaline Phosphatase: 57 U/L (ref 39–117)
BUN: 18 mg/dL (ref 6–23)
CO2: 27 mEq/L (ref 19–32)
Calcium: 9.8 mg/dL (ref 8.4–10.5)
Chloride: 103 mEq/L (ref 96–112)
Creatinine, Ser: 0.97 mg/dL (ref 0.40–1.50)
GFR: 76.8 mL/min (ref >60.00)
Glucose, Bld: 104 mg/dL — ABNORMAL HIGH (ref 70–99)
Potassium: 4.3 mEq/L (ref 3.5–5.1)
Sodium: 137 mEq/L (ref 135–145)
Total Bilirubin: 0.5 mg/dL (ref 0.2–1.2)
Total Protein: 7.2 g/dL (ref 6.0–8.3)

## 2020-03-27 MED ORDER — HYDRALAZINE HCL 25 MG PO TABS: 25.0000 mg | ORAL_TABLET | Freq: Three times a day (TID) | ORAL | 1 refills | Status: DC

## 2020-03-27 MED ORDER — OMEPRAZOLE 20 MG PO CPDR: 20.0000 mg | DELAYED_RELEASE_CAPSULE | Freq: Every day | ORAL | 3 refills | Status: DC

## 2020-03-27 NOTE — Assessment & Plan Note (Signed)
Chronic Saw neurosurgery - not a candidate for surgery Better with celebrex daily

## 2020-03-27 NOTE — Assessment & Plan Note (Signed)
Chronic DBP on the low side at times and he has chest burning and SOB at those times only Has been started on flomax, finasteride Continue valsartan 160 mg daily Will decreased hydralazine to 25 mg TID given low DBP and symptoms -- if that dose not improve he will let me know or call his cardiologist

## 2020-03-30 ENCOUNTER — Ambulatory Visit (INDEPENDENT_AMBULATORY_CARE_PROVIDER_SITE_OTHER): Payer: Medicare Other | Admitting: Pharmacist

## 2020-03-30 ENCOUNTER — Other Ambulatory Visit: Payer: Self-pay

## 2020-03-30 DIAGNOSIS — I2510 Atherosclerotic heart disease of native coronary artery without angina pectoris: Secondary | ICD-10-CM | POA: Diagnosis not present

## 2020-03-30 DIAGNOSIS — E230 Hypopituitarism: Secondary | ICD-10-CM

## 2020-03-30 DIAGNOSIS — I10 Essential (primary) hypertension: Secondary | ICD-10-CM | POA: Diagnosis not present

## 2020-03-30 DIAGNOSIS — E782 Mixed hyperlipidemia: Secondary | ICD-10-CM | POA: Diagnosis not present

## 2020-03-30 DIAGNOSIS — M542 Cervicalgia: Secondary | ICD-10-CM

## 2020-03-30 NOTE — Patient Instructions (Signed)
Visit Information  Phone number for Pharmacist: [phone]  Goals Addressed   None    Patient Care Plan: CCM Pharmacy Care Plan    Problem Identified: Hypertension, Hyperlipidemia, Coronary Artery Disease, Osteoarthritis and Pituitary insufficiency   Priority: High    Long-Range Goal: Disease management   Start Date: 03/30/2020  Expected End Date: 09/30/2020  This Visit's Progress: On track  Priority: High  Note:   Current Barriers:  . Unable to maintain control of blood pressure  Pharmacist Clinical Goal(s):  Marland Kitchen Patient will achieve control of BP as evidenced by improved home readings through collaboration with PharmD and provider.   Interventions: . 1:1 collaboration with [name], MD regarding development and update of comprehensive plan of care as evidenced by provider attestation and co-signature . Inter-disciplinary care team collaboration (see longitudinal plan of care) . Comprehensive medication review performed; medication list updated in electronic medical record   Hypertension (BP goal < 140/90) . Improving - BP 136/65, HR 44 today. Pt just saw PCP this week and reduce hydralazine back to 25 mg TID after low DBP associated with weakness/chest pain.  . Current regimen:  o Valsartan 160 mg daily  o Hydralazine 25 mg TID . Interventions: o Discussed BP goals and benefits of medications for prevention of heart attack / stroke o Advised low salt diet < 2000 mg/day o Recommend to continue current medication  Hyperlipidemia / CAD (LDL goal < 70) . Controlled - LDL at goal, pt denies side effects . Current regimen:  o Rosuvastatin 20 mg daily - 1/2 tab daily o Aspirin 81 mg daily . Interventions: o Discussed cholesterol goals and benefits of medications for prevention of heart attack / stroke o Discussed interaction between grapefruit juice and rosuvastatin is not clinically relevant in his case o Recommend to continue current medication  Back  Pain . Improving  - with addition of celecoxib and gabapentin pain has improved, although still present. Pt has follow up with neurosurgery in a couple months . Osteoarthritis of spine with radiculopathy . Current regimen:  o Celecoxib 200 mg PRN o Gabapentin 300 mg HS . Interventions: o Recommend to continue current medication  Pituitary insufficiency . Controlled - managed per endocrine (Dr Buddy Duty) . Current regimen:  o Bromocriptine 5 mg - 3 tab daily o Tesosterone 1.62% gel pump daily . Interventions: o Recommend to continue current medication  Patient Goals/Self-Care Activities . Patient will:  - take medications as prescribed focus on medication adherence by routine check blood pressure daily, document, and provide at future appointments  Follow Up Plan: Telephone follow up appointment with care management team member scheduled for: 3 months      Patient verbalizes understanding of instructions provided today and agrees to view in [place].  Telephone follow up appointment with pharmacy team member scheduled for: 3 months  Charlene Brooke, PharmD, [facility] Clinical Pharmacist [facility] [phone]

## 2020-03-30 NOTE — Progress Notes (Signed)
Chronic Care Management Pharmacy Note  03/30/2020 Name:  Patrick Harris MRN:  [id number] DOB:  Jun 06, 1945  Subjective: Patrick Harris is an 75 y.o. year old male who is a primary patient of Burns, Claudina Lick, MD.  The CCM team was consulted for assistance with disease management and care coordination needs.    Engaged with patient by telephone for follow up visit in response to provider referral for pharmacy case management and/or care coordination services.   Consent to Services:  The patient was given the following information about Chronic Care Management services today, agreed to services, and gave verbal consent: 1. CCM service includes personalized support from designated clinical staff supervised by the primary care provider, including individualized plan of care and coordination with other care providers 2. 24/7 contact phone numbers for assistance for urgent and routine care needs. 3. Service will only be billed when office clinical staff spend 20 minutes or more in a month to coordinate care. 4. Only one practitioner may furnish and bill the service in a calendar month. 5.The patient may stop CCM services at any time (effective at the end of the month) by phone call to the office staff. 6. The patient will be responsible for cost sharing (co-pay) of up to 20% of the service fee (after annual deductible is met). Patient agreed to services and consent obtained.  Patient Care Team: [name], MD as PCP - General (Internal Medicine) Satira Sark, MD as PCP - Cardiology (Cardiology) Ladene Artist, MD as Consulting Physician (Gastroenterology) Anda Kraft, MD as Consulting Physician (Endocrinology) Charlton Haws, [facility] as Pharmacist (Pharmacist)  Recent office visits: 03/27/20 Dr Quay Burow OV: when DBP low 60s, 50s gets burning sensation at rest. Recently started flomax, finasteride. Decrease hydralazine to 25 mg TID given symptoms w/ low DBP.  03/06/20 Dr Quay Burow OV: BP  150-160s/60s at home. Increased hydralazine to 50 mg TID.  02/15/20 Dr Quay Burow OV: increased valsartan to 160 mg and started hydralazine 25 mg TID  Recent consult visits: 01/27/20 Dr Domenic Polite (cardiology): f/u CAD (CABG 2000). Refilled NTG. No changes to BP meds. BP 152/80 in office.  Hospital visits: None in previous 6 months  Objective:  Lab Results  Component Value Date   CREATININE 0.97 03/27/2020   BUN 18 03/27/2020   GFR 76.80 03/27/2020   GFRNONAA >60 11/15/2016   GFRAA >60 11/15/2016   NA 137 03/27/2020   K 4.3 03/27/2020   CALCIUM 9.8 03/27/2020   CO2 27 03/27/2020   GLUCOSE 104 (H) 03/27/2020    Lab Results  Component Value Date/Time   HGBA1C 5.5 08/25/2019 08:47 AM   HGBA1C 6.1 08/24/2018 09:05 AM   HGBA1C 5.9 12/23/2017 12:00 AM   GFR 76.80 03/27/2020 11:15 AM   GFR 87.01 03/18/2019 09:14 AM    Last diabetic Eye exam: No results found for: HMDIABEYEEXA  Last diabetic Foot exam: No results found for: HMDIABFOOTEX   Lab Results  Component Value Date   CHOL 122 08/25/2019   HDL 33 (L) 08/25/2019   LDLCALC 71 08/25/2019   TRIG 98 08/25/2019   CHOLHDL 3.7 08/25/2019    Hepatic Function Latest Ref Rng & Units 03/27/2020 08/25/2019 03/18/2019  Total Protein 6.0 - 8.3 g/dL 7.2 7.1 7.2  Albumin 3.5 - 5.2 g/dL 4.4 - 4.1  AST 0 - 37 U/L 17 18 20   ALT 0 - 53 U/L 17 12 18   Alk Phosphatase 39 - 117 U/L 57 - 64  Total Bilirubin 0.2 -  1.2 mg/dL 0.5 0.7 0.6  Bilirubin, Direct 0.0 - 0.3 mg/dL - - -    Lab Results  Component Value Date/Time   TSH 2.52 08/25/2019 08:47 AM   TSH 1.93 08/22/2017 08:47 AM    CBC Latest Ref Rng & Units 08/25/2019 03/18/2019 08/24/2018  WBC 3.8 - 10.8 Thousand/uL 5.5 6.4 5.8  Hemoglobin 13.2 - 17.1 g/dL 16.5 15.7 14.9  Hematocrit 38.5 - 50.0 % 50.9(H) 45.9 44.2  Platelets 140 - 400 Thousand/uL 190 180.0 184.0    Lab Results  Component Value Date/Time   VD25OH 41 08/25/2019 08:47 AM   VD25OH 64.84 08/24/2018 09:05 AM   VD25OH 65.02  08/22/2017 08:47 AM    Clinical ASCVD: Yes  The ASCVD Risk score Mikey Bussing DC Jr., et al., 2013) failed to calculate for the following reasons:   The valid total cholesterol range is 130 to 320 mg/dL    Depression screen [facility] 2/9 08/25/2019 07/20/2018 03/02/2018  Decreased Interest 0 0 0  Down, Depressed, Hopeless 0 0 0  PHQ - 2 Score 0 0 0     Social History   Tobacco Use  Smoking Status Former Smoker  . Quit date: 01/07/1998  . Years since quitting: 22.2  Smokeless Tobacco Never Used  Tobacco Comment   smoked 1963- 2000, up to 1 ppd   BP Readings from Last 3 Encounters:  03/27/20 140/78  03/06/20 (!) 150/70  02/15/20 (!) 180/82   Pulse Readings from Last 3 Encounters:  03/27/20 67  03/06/20 60  02/15/20 (!) 58   Wt Readings from Last 3 Encounters:  03/27/20 191 lb (86.6 kg)  03/06/20 191 lb 12.8 oz (87 kg)  02/15/20 188 lb (85.3 kg)   BMI Readings from Last 3 Encounters:  03/27/20 25.90 kg/m  03/06/20 26.01 kg/m  02/15/20 25.50 kg/m    Assessment/Interventions: Review of patient past medical history, allergies, medications, health status, including review of consultants reports, laboratory and other test data, was performed as part of comprehensive evaluation and provision of chronic care management services.   SDOH:  (Social Determinants of Health) assessments and interventions performed: Yes  SDOH Screenings   Alcohol Screen: Not on file  Depression (PHQ2-9): Low Risk   . PHQ-2 Score: 0  Financial Resource Strain: Low Risk   . Difficulty of Paying Living Expenses: Not hard at all  Food Insecurity: Not on file  Housing: Not on file  Physical Activity: Not on file  Social Connections: Not on file  Stress: Not on file  Tobacco Use: Medium Risk  . Smoking Tobacco Use: Former Smoker  . Smokeless Tobacco Use: Never Used  Transportation Needs: Not on file    CCM Care Plan  Allergies  Allergen Reactions  . Latex     Contact  dermatitis  . Lisinopril      Leg pain, inc HR, dizziness, headache  . Amlodipine     Edema   . Hydrochlorothiazide Other (See Comments)    Possibly caused pancreatitis 2018  . Simvastatin     Joint pain    Medications Reviewed Today    Reviewed by [name], MD (Physician) on 03/27/20 at 1044  Med List Status: <None>  Medication Order Taking? Sig Documenting Provider Last Dose Status Informant  acetaminophen (TYLENOL) 500 MG tablet [id number] Yes Take 500 mg by mouth every 6 (six) hours as needed. [provider] Taking Active Self  aspirin EC 81 MG tablet [id number] Yes Take 1 tablet (81 mg total) by mouth at bedtime.  [name], MD Taking Active   bromocriptine (PARLODEL) 5 MG capsule [id number] Yes Take 15 mg by mouth daily. [provider] Taking Active   celecoxib (CELEBREX) 200 MG capsule [id number] Yes Take 200 mg by mouth daily as needed. [provider] Taking Active   Cholecalciferol (VITAMIN D3) 2000 UNITS TABS [id number] Yes Take by mouth. 2000 iu in the am, 1000 iu in the pm [provider] Taking Active Self  finasteride (PROSCAR) 5 MG tablet [id number] Yes Take 5 mg by mouth daily. [provider] Taking Active   FLUZONE HIGH-DOSE QUADRIVALENT 0.7 ML SUSY [id number] Yes  [provider] Taking Active   gabapentin (NEURONTIN) 300 MG capsule [id number] Yes Take 300 mg by mouth at bedtime. [provider] Taking Active   hydrALAZINE (APRESOLINE) 50 MG tablet [id number] Yes Take 1 tablet (50 mg total) by mouth 3 (three) times daily. [name], MD Taking Active   nitroGLYCERIN (NITROSTAT) 0.4 MG SL tablet [id number] Yes Place 1 tablet (0.4 mg total) under the tongue every 5 (five) minutes as needed for chest pain. Satira Sark, MD Taking Active   omeprazole (PRILOSEC) 40 MG capsule [id number] Yes Take 1 capsule (40 mg total) by mouth daily. Ladene Artist, MD Taking Active   rosuvastatin (CRESTOR) 20 MG tablet [id number] Yes TAKE HALF A  TABLET (10 MG) BY MOUTH ONCE DAILY. [name], MD Taking Active   tamsulosin [facility]) 0.4 MG CAPS capsule [id number] No Take 1 capsule (0.4 mg total) by mouth daily.  Patient not taking: Reported on 03/27/2020   [name], MD Not Taking Active   tamsulosin [facility]) 0.4 MG CAPS capsule [id number] Yes Take 0.4 mg by mouth 2 (two) times daily. [provider] Taking Active   Testosterone 20.25 MG/ACT (1.62%) GEL [id number] Yes SMARTSIG:2 Pump Topical Daily [provider] Taking Active   valsartan (DIOVAN) 160 MG tablet [id number] Yes Take 1 tablet (160 mg total) by mouth daily. [name], MD Taking Active   vitamin C (ASCORBIC ACID) 500 MG tablet [id number] Yes Take 500 mg by mouth 2 (two) times daily. [provider] Taking Active Self          Patient Active Problem List   Diagnosis Date Noted  . Aortic atherosclerosis ([place]) 08/25/2019  . Inguinal hernia 08/25/2019  . Chronic periumbilical pain 83/38/2505  . Neurogenic claudication ([place]) 08/24/2018  . Neck pain 03/02/2018  . Neuralgia 03/02/2018  . Prediabetes 08/22/2017  . Umbilical hernia 39/76/7341  . Varicose veins of both lower extremities 05/22/2016  . Vitamin D deficiency 08/21/2015  . Hypertension 01/24/2015  . Tingling in extremities 01/24/2015  . Family history of prostate cancer 08/19/2014  . Osteopenia 08/07/2009  . Hyperlipidemia 08/12/2007  . PITUITARY INSUFFICIENCY 08/05/2007  . Testosterone deficiency 08/05/2007  . Coronary atherosclerosis 08/05/2007  . GERD 08/05/2007  . BPH without obstruction/lower urinary tract symptoms 08/05/2007    Immunization History  Administered Date(s) Administered  . Influenza Whole 10/07/2007, 10/17/2008  . Influenza, High Dose Seasonal PF 10/16/2012, 10/15/2013, 10/20/2017, 10/19/2018, 11/03/2019  . Influenza-Unspecified 10/25/2015, 10/14/2016  . Moderna Sars-Covid-2 Vaccination 01/26/2019, 02/23/2019, 09/16/2019  . Pneumococcal Conjugate-13  08/19/2014  . Pneumococcal Polysaccharide-23 08/09/2010  . Td 01/07/2001  . Tdap 09/30/2011    Conditions to be addressed/monitored:  Hypertension, Hyperlipidemia, Coronary Artery Disease, Osteoarthritis and Pituitary insufficiency  Care Plan : [place]  Updates made by Charlton Haws, [place] since 03/30/2020 12:00 AM    Problem: Hypertension,  Hyperlipidemia, Coronary Artery Disease, Osteoarthritis and Pituitary insufficiency   Priority: High    Long-Range Goal: Disease management   Start Date: 03/30/2020  Expected End Date: 09/30/2020  This Visit's Progress: On track  Priority: High  Note:   Current Barriers:  . Unable to maintain control of blood pressure  Pharmacist Clinical Goal(s):  Marland Kitchen Patient will achieve control of BP as evidenced by improved home readings through collaboration with PharmD and provider.   Interventions: . 1:1 collaboration with [name], MD regarding development and update of comprehensive plan of care as evidenced by provider attestation and co-signature . Inter-disciplinary care team collaboration (see longitudinal plan of care) . Comprehensive medication review performed; medication list updated in electronic medical record   Hypertension (BP goal < 140/90) . Improving - BP 136/65, HR 44 today. Pt just saw PCP this week and reduce hydralazine back to 25 mg TID after low DBP associated with weakness/chest pain.  . Current regimen:  o Valsartan 160 mg daily  o Hydralazine 25 mg TID . Interventions: o Discussed BP goals and benefits of medications for prevention of heart attack / stroke o Advised low salt diet < 2000 mg/day o Recommend to continue current medication  Hyperlipidemia / CAD (LDL goal < 70) . Controlled - LDL at goal, pt denies side effects . Current regimen:  o Rosuvastatin 20 mg daily - 1/2 tab daily o Aspirin 81 mg daily . Interventions: o Discussed cholesterol goals and benefits of medications for prevention  of heart attack / stroke o Discussed interaction between grapefruit juice and rosuvastatin is not clinically relevant in his case o Recommend to continue current medication  Back Pain . Improving  - with addition of celecoxib and gabapentin pain has improved, although still present. Pt has follow up with neurosurgery in a couple months . Osteoarthritis of spine with radiculopathy . Current regimen:  o Celecoxib 200 mg PRN o Gabapentin 300 mg HS . Interventions: o Recommend to continue current medication  Pituitary insufficiency . Controlled - managed per endocrine (Dr Buddy Duty) . Current regimen:  o Bromocriptine 5 mg - 3 tab daily o Tesosterone 1.62% gel pump daily . Interventions: o Recommend to continue current medication  Patient Goals/Self-Care Activities . Patient will:  - take medications as prescribed focus on medication adherence by routine check blood pressure daily, document, and provide at future appointments  Follow Up Plan: Telephone follow up appointment with care management team member scheduled for: 3 months      Medication Assistance: None required.  Patient affirms current coverage meets needs.  Patient's preferred pharmacy is:  [id number] [place] [id number]: [phone]: [phone] Uses pill box? No - prefers bottles Pt endorses 100% compliance  We discussed: Current pharmacy is preferred with insurance plan and patient is satisfied with pharmacy services Patient decided to: Continue current medication management strategy  Care Plan and Follow Up Patient Decision:  Patient agrees to Care Plan and Follow-up.  Plan: Telephone follow up appointment with care management team member scheduled for:  3 months  LCharlene Brooke PharmD, BAlger CPP Clinical Pharmacist [facility] [phone]

## 2020-04-13 ENCOUNTER — Telehealth: Payer: Self-pay

## 2020-04-13 NOTE — Progress Notes (Addendum)
Chronic Care Management Pharmacy Assistant   Name: Patrick Harris  MRN: [id number] DOB: December 24, 1945    Reason for Encounter: Disease State-Hypertension  Recent office visits: None noted.  Recent consult visits: None noted  Hospital visits:  None in previous 6 months  Medications: Outpatient Encounter Medications as of 04/13/2020  Medication Sig   acetaminophen (TYLENOL) 500 MG tablet Take 500 mg by mouth every 6 (six) hours as needed.   aspirin EC 81 MG tablet Take 1 tablet (81 mg total) by mouth at bedtime.   bromocriptine (PARLODEL) 5 MG capsule Take 15 mg by mouth daily.   celecoxib (CELEBREX) 200 MG capsule Take 200 mg by mouth daily as needed.   Cholecalciferol (VITAMIN D3) 2000 UNITS TABS Take by mouth. 2000 iu in the am, 1000 iu in the pm   finasteride (PROSCAR) 5 MG tablet Take 5 mg by mouth daily.   FLUZONE HIGH-DOSE QUADRIVALENT 0.7 ML SUSY    gabapentin (NEURONTIN) 300 MG capsule Take 300 mg by mouth at bedtime.   hydrALAZINE (APRESOLINE) 25 MG tablet Take 1 tablet (25 mg total) by mouth 3 (three) times daily.   nitroGLYCERIN (NITROSTAT) 0.4 MG SL tablet Place 1 tablet (0.4 mg total) under the tongue every 5 (five) minutes as needed for chest pain.   omeprazole (PRILOSEC) 20 MG capsule Take 1 capsule (20 mg total) by mouth daily.   rosuvastatin (CRESTOR) 20 MG tablet TAKE HALF A TABLET (10 MG) BY MOUTH ONCE DAILY.   tamsulosin (FLOMAX) 0.4 MG CAPS capsule Take 0.4 mg by mouth 2 (two) times daily.   Testosterone 20.25 MG/ACT (1.62%) GEL SMARTSIG:2 Pump Topical Daily   valsartan (DIOVAN) 160 MG tablet Take 1 tablet (160 mg total) by mouth daily.   vitamin C (ASCORBIC ACID) 500 MG tablet Take 500 mg by mouth 2 (two) times daily.   No facility-administered encounter medications on file as of 04/13/2020.   Reviewed chart prior to disease state call. Spoke with patient regarding BP  Recent Office Vitals: BP Readings from Last 3 Encounters:  03/27/20 140/78  03/06/20  (!) 150/70  02/15/20 (!) 180/82   Pulse Readings from Last 3 Encounters:  03/27/20 67  03/06/20 60  02/15/20 (!) 58    Wt Readings from Last 3 Encounters:  03/27/20 191 lb (86.6 kg)  03/06/20 191 lb 12.8 oz (87 kg)  02/15/20 188 lb (85.3 kg)     Kidney Function Lab Results  Component Value Date/Time   CREATININE 0.97 03/27/2020 11:15 AM   CREATININE 1.07 08/25/2019 08:47 AM   CREATININE 0.90 05/05/2019 08:34 AM   GFR 76.80 03/27/2020 11:15 AM   GFRNONAA >60 11/15/2016 02:10 PM   GFRAA >60 11/15/2016 02:10 PM    BMP Latest Ref Rng & Units 03/27/2020 08/25/2019 05/05/2019  Glucose 70 - 99 mg/dL 104(H) 97 -  BUN 6 - 23 mg/dL 18 13 -  Creatinine 0.40 - 1.50 mg/dL 0.97 1.07 0.90  BUN/Creat Ratio 6 - 22 (calc) - NOT APPLICABLE -  Sodium 948 - 145 mEq/L 137 138 -  Potassium 3.5 - 5.1 mEq/L 4.3 4.6 -  Chloride 96 - 112 mEq/L 103 103 -  CO2 19 - 32 mEq/L 27 30 -  Calcium 8.4 - 10.5 mg/dL 9.8 9.3 -    Current antihypertensive regimen:  Valsartan 160 mg daily  Hydralazine 25 mg TID How often are you checking your Blood Pressure? 1-2x per week   Current home BP readings:   04/07/20 137/64  04/13/20  119/66  What recent interventions/DTPs have been made by any provider to improve Blood Pressure control since last CPP Visit:   Any recent hospitalizations or ED visits since last visit with CPP? No   What diet changes have been made to improve Blood Pressure Control?  Patient states he continues to eat without adding salt.  What exercise is being done to improve your Blood Pressure Control?  Patient states he rides his exercise bike.  Adherence Review: Is the patient currently on ACE/ARB medication? Yes Does the patient have >5 day gap between last estimated fill dates? No   Star Rating Drugs: Rosuvastatin 02/17/20 90D Valsartan 03/27/20 [place], [place] Clinical Pharmacists Assistant [phone]

## 2020-05-12 ENCOUNTER — Other Ambulatory Visit: Payer: Self-pay | Admitting: Internal Medicine

## 2020-05-18 ENCOUNTER — Telehealth: Payer: Self-pay | Admitting: Pharmacist

## 2020-05-18 NOTE — Progress Notes (Signed)
Chronic Care Management Pharmacy Assistant   Name: Patrick Harris  MRN: [id number] DOB: 1945/02/20    Reason for Encounter: Disease State Hypertension Call   Conditions to be addressed/monitored: HTN   Recent office visits:  None ID  Recent consult visits:  None ID  Hospital visits:  None in previous 6 months  Medications: Outpatient Encounter Medications as of 05/18/2020  Medication Sig  . acetaminophen (TYLENOL) 500 MG tablet Take 500 mg by mouth every 6 (six) hours as needed.  Marland Kitchen aspirin EC 81 MG tablet Take 1 tablet (81 mg total) by mouth at bedtime.  . bromocriptine (PARLODEL) 5 MG capsule Take 15 mg by mouth daily.  . celecoxib (CELEBREX) 200 MG capsule Take 200 mg by mouth daily as needed.  . Cholecalciferol (VITAMIN D3) 2000 UNITS TABS Take by mouth. 2000 iu in the am, 1000 iu in the pm  . finasteride (PROSCAR) 5 MG tablet Take 5 mg by mouth daily.  Marland Kitchen FLUZONE HIGH-DOSE QUADRIVALENT 0.7 ML SUSY   . gabapentin (NEURONTIN) 300 MG capsule Take 300 mg by mouth at bedtime.  . hydrALAZINE (APRESOLINE) 25 MG tablet Take 1 tablet (25 mg total) by mouth 3 (three) times daily.  . nitroGLYCERIN (NITROSTAT) 0.4 MG SL tablet Place 1 tablet (0.4 mg total) under the tongue every 5 (five) minutes as needed for chest pain.  Marland Kitchen omeprazole (PRILOSEC) 20 MG capsule Take 1 capsule (20 mg total) by mouth daily.  . rosuvastatin (CRESTOR) 20 MG tablet TAKE HALF A TABLET (10 MG) BY MOUTH ONCE DAILY.  . tamsulosin (FLOMAX) 0.4 MG CAPS capsule Take 0.4 mg by mouth 2 (two) times daily.  . Testosterone 20.25 MG/ACT (1.62%) GEL SMARTSIG:2 Pump Topical Daily  . valsartan (DIOVAN) 160 MG tablet TAKE 1 TABLET BY MOUTH EVERY DAY  . vitamin C (ASCORBIC ACID) 500 MG tablet Take 500 mg by mouth 2 (two) times daily.   No facility-administered encounter medications on file as of 05/18/2020.   Reviewed chart prior to disease state call. Spoke with patient regarding BP  Recent Office Vitals: BP  Readings from Last 3 Encounters:  03/27/20 140/78  03/06/20 (!) 150/70  02/15/20 (!) 180/82   Pulse Readings from Last 3 Encounters:  03/27/20 67  03/06/20 60  02/15/20 (!) 58    Wt Readings from Last 3 Encounters:  03/27/20 191 lb (86.6 kg)  03/06/20 191 lb 12.8 oz (87 kg)  02/15/20 188 lb (85.3 kg)     Kidney Function Lab Results  Component Value Date/Time   CREATININE 0.97 03/27/2020 11:15 AM   CREATININE 1.07 08/25/2019 08:47 AM   CREATININE 0.90 05/05/2019 08:34 AM   GFR 76.80 03/27/2020 11:15 AM   GFRNONAA >60 11/15/2016 02:10 PM   GFRAA >60 11/15/2016 02:10 PM    BMP Latest Ref Rng & Units 03/27/2020 08/25/2019 05/05/2019  Glucose 70 - 99 mg/dL 104(H) 97 -  BUN 6 - 23 mg/dL 18 13 -  Creatinine 0.40 - 1.50 mg/dL 0.97 1.07 0.90  BUN/Creat Ratio 6 - 22 (calc) - NOT APPLICABLE -  Sodium 258 - 145 mEq/L 137 138 -  Potassium 3.5 - 5.1 mEq/L 4.3 4.6 -  Chloride 96 - 112 mEq/L 103 103 -  CO2 19 - 32 mEq/L 27 30 -  Calcium 8.4 - 10.5 mg/dL 9.8 9.3 -    . Current antihypertensive regimen:  Valsartan 160 mg daily Hydralazine 25 mg TID  . How often are you checking your Blood Pressure? 1-2x per week   .  Current home BP readings:           05/08/20 126/63           05/17/20 146/68  . What recent interventions/DTPs have been made by any provider to improve Blood Pressure control since last CPP Visit: None ID .  Marland Kitchen Any recent hospitalizations or ED visits since last visit with CPP? No   . What diet changes have been made to improve Blood Pressure Control?  Patient states nothing has changed with diet  . What exercise is being done to improve your Blood Pressure Control?  Patient states he does exercise, has a stationary bike at home  Adherence Review: Is the patient currently on ACE/ARB medication? Yes Does the patient have >5 day gap between last estimated fill dates? No  Star Rating Drugs: Rosuvastatin 02/17/20 90 ds Valsartan 05/12/20 90 ds  Ethelene Hal Clinical Pharmacist Assistant [phone]  Time spent:20

## 2020-05-25 DIAGNOSIS — E230 Hypopituitarism: Secondary | ICD-10-CM | POA: Diagnosis not present

## 2020-05-29 ENCOUNTER — Other Ambulatory Visit: Payer: Self-pay | Admitting: Internal Medicine

## 2020-06-16 DIAGNOSIS — D352 Benign neoplasm of pituitary gland: Secondary | ICD-10-CM | POA: Diagnosis not present

## 2020-06-16 DIAGNOSIS — E230 Hypopituitarism: Secondary | ICD-10-CM | POA: Diagnosis not present

## 2020-06-16 DIAGNOSIS — Z5181 Encounter for therapeutic drug level monitoring: Secondary | ICD-10-CM | POA: Diagnosis not present

## 2020-06-16 DIAGNOSIS — E221 Hyperprolactinemia: Secondary | ICD-10-CM | POA: Diagnosis not present

## 2020-06-29 ENCOUNTER — Telehealth: Payer: Medicare Other

## 2020-06-29 ENCOUNTER — Telehealth: Payer: Self-pay | Admitting: Pharmacist

## 2020-06-29 NOTE — Progress Notes (Deleted)
Chronic Care Management Pharmacy Note  06/29/2020 Name:  Patrick Harris MRN:  [id number] DOB:  [birth year]  Subjective: Patrick Harris is an 75 y.o. year old male who is a primary patient of Burns, Claudina Lick, MD.  The CCM team was consulted for assistance with disease management and care coordination needs.    Engaged with patient by telephone for follow up visit in response to provider referral for pharmacy case management and/or care coordination services.   Consent to Services:  The patient was given information about Chronic Care Management services, agreed to services, and gave verbal consent prior to initiation of services.  Please see initial visit note for detailed documentation.   Patient Care Team: [name], MD as PCP - General (Internal Medicine) Satira Sark, MD as PCP - Cardiology (Cardiology) Ladene Artist, MD as Consulting Physician (Gastroenterology) Anda Kraft, MD as Consulting Physician (Endocrinology) Charlton Haws, [facility] as Pharmacist (Pharmacist)  Recent office visits: 03/27/20 Dr Quay Burow OV: when DBP low 60s, 50s gets burning sensation at rest. Recently started flomax, finasteride. Decrease hydralazine to 25 mg TID given symptoms w/ low DBP.  03/06/20 Dr Quay Burow OV: BP 150-160s/60s at home. Increased hydralazine to 50 mg TID.  02/15/20 Dr Quay Burow OV: increased valsartan to 160 mg and started hydralazine 25 mg TID  Recent consult visits: 01/27/20 Dr Domenic Polite (cardiology): f/u CAD (CABG 2000). Refilled NTG. No changes to BP meds. BP 152/80 in office.  Hospital visits: None in previous 6 months  Objective:  Lab Results  Component Value Date   CREATININE 0.97 03/27/2020   BUN 18 03/27/2020   GFR 76.80 03/27/2020   GFRNONAA >60 11/15/2016   GFRAA >60 11/15/2016   NA 137 03/27/2020   K 4.3 03/27/2020   CALCIUM 9.8 03/27/2020   CO2 27 03/27/2020   GLUCOSE 104 (H) 03/27/2020    Lab Results  Component Value Date/Time   HGBA1C 5.5  08/25/2019 08:47 AM   HGBA1C 6.1 08/24/2018 09:05 AM   HGBA1C 5.9 12/23/2017 12:00 AM   GFR 76.80 03/27/2020 11:15 AM   GFR 87.01 03/18/2019 09:14 AM    Last diabetic Eye exam: No results found for: HMDIABEYEEXA  Last diabetic Foot exam: No results found for: HMDIABFOOTEX   Lab Results  Component Value Date   CHOL 122 08/25/2019   HDL 33 (L) 08/25/2019   LDLCALC 71 08/25/2019   TRIG 98 08/25/2019   CHOLHDL 3.7 08/25/2019    Hepatic Function Latest Ref Rng & Units 03/27/2020 08/25/2019 03/18/2019  Total Protein 6.0 - 8.3 g/dL 7.2 7.1 7.2  Albumin 3.5 - 5.2 g/dL 4.4 - 4.1  AST 0 - 37 U/L 17 18 20   ALT 0 - 53 U/L 17 12 18   Alk Phosphatase 39 - 117 U/L 57 - 64  Total Bilirubin 0.2 - 1.2 mg/dL 0.5 0.7 0.6  Bilirubin, Direct 0.0 - 0.3 mg/dL - - -    Lab Results  Component Value Date/Time   TSH 2.52 08/25/2019 08:47 AM   TSH 1.93 08/22/2017 08:47 AM    CBC Latest Ref Rng & Units 08/25/2019 03/18/2019 08/24/2018  WBC 3.8 - 10.8 Thousand/uL 5.5 6.4 5.8  Hemoglobin 13.2 - 17.1 g/dL 16.5 15.7 14.9  Hematocrit 38.5 - 50.0 % 50.9(H) 45.9 44.2  Platelets 140 - 400 Thousand/uL 190 180.0 184.0    Lab Results  Component Value Date/Time   VD25OH 41 08/25/2019 08:47 AM   VD25OH 64.84 08/24/2018 09:05 AM   VD25OH 65.02 08/22/2017 08:47  AM    Clinical ASCVD: Yes  The ASCVD Risk score Mikey Bussing DC Jr., et al., 2013) failed to calculate for the following reasons:   The valid total cholesterol range is 130 to 320 mg/dL    Depression screen [facility] 2/9 08/25/2019 07/20/2018 03/02/2018  Decreased Interest 0 0 0  Down, Depressed, Hopeless 0 0 0  PHQ - 2 Score 0 0 0     Social History   Tobacco Use  Smoking Status Former   Pack years: 0.00   Types: Cigarettes   Quit date: 01/07/1998   Years since quitting: 22.4  Smokeless Tobacco Never  Tobacco Comments   smoked 1963- 2000, up to 1 ppd   BP Readings from Last 3 Encounters:  03/27/20 140/78  03/06/20 (!) 150/70  02/15/20 (!) 180/82   Pulse  Readings from Last 3 Encounters:  03/27/20 67  03/06/20 60  02/15/20 (!) 58   Wt Readings from Last 3 Encounters:  03/27/20 191 lb (86.6 kg)  03/06/20 191 lb 12.8 oz (87 kg)  02/15/20 188 lb (85.3 kg)   BMI Readings from Last 3 Encounters:  03/27/20 25.90 kg/m  03/06/20 26.01 kg/m  02/15/20 25.50 kg/m    Assessment/Interventions: Review of patient past medical history, allergies, medications, health status, including review of consultants reports, laboratory and other test data, was performed as part of comprehensive evaluation and provision of chronic care management services.   SDOH:  (Social Determinants of Health) assessments and interventions performed: Yes  SDOH Screenings   Alcohol Screen: Not on file  Depression (PHQ2-9): Low Risk    PHQ-2 Score: 0  Financial Resource Strain: Low Risk    Difficulty of Paying Living Expenses: Not hard at all  Food Insecurity: Not on file  Housing: Not on file  Physical Activity: Not on file  Social Connections: Not on file  Stress: Not on file  Tobacco Use: Medium Risk   Smoking Tobacco Use: Former   Smokeless Tobacco Use: Never  Transportation Needs: Not on file    [place]  Allergies  Allergen Reactions   Latex     Contact  dermatitis   Lisinopril     Leg pain, inc HR, dizziness, headache   Amlodipine     Edema    Hydrochlorothiazide Other (See Comments)    Possibly caused pancreatitis 2018   Simvastatin     Joint pain    Medications Reviewed Today     Reviewed by [name], MD (Physician) on 03/27/20 at [place] List Status: <None>   Medication Order Taking? Sig Documenting Provider Last Dose Status Informant  acetaminophen (TYLENOL) 500 MG tablet [id number] Yes Take 500 mg by mouth every 6 (six) hours as needed. [provider] Taking Active Self  aspirin EC 81 MG tablet [id number] Yes Take 1 tablet (81 mg total) by mouth at bedtime. [name], MD Taking Active   bromocriptine  (PARLODEL) 5 MG capsule [id number] Yes Take 15 mg by mouth daily. [provider] Taking Active   celecoxib (CELEBREX) 200 MG capsule [id number] Yes Take 200 mg by mouth daily as needed. [provider] Taking Active   Cholecalciferol (VITAMIN D3) 2000 UNITS TABS [id number] Yes Take by mouth. 2000 iu in the am, 1000 iu in the pm [provider] Taking Active Self  finasteride (PROSCAR) 5 MG tablet [id number] Yes Take 5 mg by mouth daily. [provider] Taking Active   FLUZONE HIGH-DOSE QUADRIVALENT 0.7 ML SUSY [id number] Yes  [provider] Taking Active   gabapentin (NEURONTIN) 300 MG capsule [id number] Yes Take 300 mg by mouth at bedtime. [provider] Taking Active   hydrALAZINE (APRESOLINE) 50 MG tablet [id number] Yes Take 1 tablet (50 mg total) by mouth 3 (three) times daily. [name], MD Taking Active   nitroGLYCERIN (NITROSTAT) 0.4 MG SL tablet [id number] Yes Place 1 tablet (0.4 mg total) under the tongue every 5 (five) minutes as needed for chest pain. Satira Sark, MD Taking Active   omeprazole (PRILOSEC) 40 MG capsule [id number] Yes Take 1 capsule (40 mg total) by mouth daily. Ladene Artist, MD Taking Active   rosuvastatin (CRESTOR) 20 MG tablet [id number] Yes TAKE HALF A TABLET (10 MG) BY MOUTH ONCE DAILY. [name], MD Taking Active   tamsulosin [facility]) 0.4 MG CAPS capsule [id number] No Take 1 capsule (0.4 mg total) by mouth daily.  Patient not taking: Reported on 03/27/2020   [name], MD Not Taking Active   tamsulosin [facility]) 0.4 MG CAPS capsule [id number] Yes Take 0.4 mg by mouth 2 (two) times daily. [provider] Taking Active   Testosterone 20.25 MG/ACT (1.62%) GEL [id number] Yes SMARTSIG:2 Pump Topical Daily [provider] Taking Active   valsartan (DIOVAN) 160 MG tablet [id number] Yes Take 1 tablet (160 mg total) by mouth daily. [name], MD Taking Active   vitamin C (ASCORBIC ACID) 500  MG tablet [id number] Yes Take 500 mg by mouth 2 (two) times daily. [provider] Taking Active Self            Patient Active Problem List   Diagnosis Date Noted   Aortic atherosclerosis ([place]) 08/25/2019   Inguinal hernia 08/25/2019   Chronic periumbilical pain 28/31/5176   Neurogenic claudication ([place]) 08/24/2018   Neck pain 03/02/2018   Neuralgia 03/02/2018   Prediabetes 16/07/3708   Umbilical hernia 62/69/4854   Varicose veins of both lower extremities 05/22/2016   Vitamin D deficiency 08/21/2015   Hypertension 01/24/2015   Tingling in extremities 01/24/2015   Family history of prostate cancer 08/19/2014   Osteopenia 08/07/2009   Hyperlipidemia 08/12/2007   PITUITARY INSUFFICIENCY 08/05/2007   Testosterone deficiency 08/05/2007   Coronary atherosclerosis 08/05/2007   GERD 08/05/2007   BPH without obstruction/lower urinary tract symptoms 08/05/2007    Immunization History  Administered Date(s) Administered   Influenza Whole 10/07/2007, 10/17/2008   Influenza, High Dose Seasonal PF 10/16/2012, 10/15/2013, 10/20/2017, 10/19/2018, 11/03/2019   Influenza-Unspecified 10/25/2015, 10/14/2016   Moderna Sars-Covid-2 Vaccination 01/26/2019, 02/23/2019, 09/16/2019   Pneumococcal Conjugate-13 08/19/2014   Pneumococcal Polysaccharide-23 08/09/2010   Td 01/07/2001   Tdap 09/30/2011    Conditions to be addressed/monitored:  Hypertension, Hyperlipidemia, Coronary Artery Disease, Osteoarthritis and Pituitary insufficiency  Patient Care Plan: CCM Pharmacy Care Plan     Problem Identified: Hypertension, Hyperlipidemia, Coronary Artery Disease, Osteoarthritis and Pituitary insufficiency   Priority: High     Long-Range Goal: Disease management   Start Date: 03/30/2020  Expected End Date: 09/30/2020  This Visit's Progress: On track  Priority: High  Note:   Current Barriers:  Unable to maintain control of blood pressure  Pharmacist Clinical Goal(s):  Patient will  achieve control of BP as evidenced by improved home readings through collaboration with PharmD and provider.   Interventions: 1:1 collaboration with [name], MD regarding development and update of comprehensive plan of care as evidenced by provider attestation and co-signature Inter-disciplinary care team collaboration (see longitudinal plan of care) Comprehensive medication review performed; medication  list updated in electronic medical record   Hypertension (BP goal < 140/90) Improving - BP 136/65, HR 44 today. Pt just saw PCP this week and reduce hydralazine back to 25 mg TID after low DBP associated with weakness/chest pain.  Current regimen:  Valsartan 160 mg daily  Hydralazine 25 mg TID Interventions: Discussed BP goals and benefits of medications for prevention of heart attack / stroke Advised low salt diet < 2000 mg/day Recommend to continue current medication  Hyperlipidemia / CAD (LDL goal < 70) Controlled - LDL at goal, pt denies side effects Current regimen:  Rosuvastatin 20 mg daily - 1/2 tab daily Aspirin 81 mg daily Interventions: Discussed cholesterol goals and benefits of medications for prevention of heart attack / stroke Discussed interaction between grapefruit juice and rosuvastatin is not clinically relevant in his case Recommend to continue current medication  Back Pain Improving  - with addition of celecoxib and gabapentin pain has improved, although still present. Pt has follow up with neurosurgery in a couple months Osteoarthritis of spine with radiculopathy Current regimen:  Celecoxib 200 mg PRN Gabapentin 300 mg HS Interventions: Recommend to continue current medication  Pituitary insufficiency Controlled - managed per endocrine (Dr Buddy Duty) Current regimen:  Bromocriptine 5 mg - 3 tab daily Tesosterone 1.62% gel pump daily Interventions: Recommend to continue current medication  Patient Goals/Self-Care Activities Patient will:  - take  medications as prescribed focus on medication adherence by routine check blood pressure daily, document, and provide at future appointments  Follow Up Plan: Telephone follow up appointment with care management team member scheduled for: 3 months      Medication Assistance: None required.  Patient affirms current coverage meets needs.  Patient's preferred pharmacy is:  [id number] [place] [address]Phone: [phone]: [phone] Uses pill box? No - prefers bottles Pt endorses 100% compliance  We discussed: Current pharmacy is preferred with insurance plan and patient is satisfied with pharmacy services Patient decided to: Continue current medication management strategy  Care Plan and Follow Up Patient Decision:  Patient agrees to Care Plan and Follow-up.  Plan: Telephone follow up appointment with care management team member scheduled for:  3 months  LCharlene Brooke PharmD, [place] CPP Clinical Pharmacist [phone]

## 2020-06-29 NOTE — Telephone Encounter (Signed)
  Chronic Care Management   Outreach Note  06/29/2020 Name: COYT GOVONI MRN: [id number] DOB: 11/26/1945  Referred by: [name], MD  Patient had a phone appointment scheduled with clinical pharmacist today.  An unsuccessful telephone outreach was attempted today. The patient was referred to the pharmacist for assistance with medications, care management and care coordination.   Patient will NOT be penalized in any way for missing a CCM appointment. The no-show fee does not apply.  If possible, a message was left to return call to: [phone] or to [facility]: [place], PharmD, Para [place], CPP Clinical Pharmacist [place] [facility] [phone]

## 2020-07-05 NOTE — Progress Notes (Signed)
    Chronic Care Management Pharmacy Assistant   Name: Patrick Harris  MRN: [id number] DOB: November 30, 1945   Reason for Encounter: Chart Review    Medications: Outpatient Encounter Medications as of 06/29/2020  Medication Sig   acetaminophen (TYLENOL) 500 MG tablet Take 500 mg by mouth every 6 (six) hours as needed.   aspirin EC 81 MG tablet Take 1 tablet (81 mg total) by mouth at bedtime.   bromocriptine (PARLODEL) 5 MG capsule Take 15 mg by mouth daily.   celecoxib (CELEBREX) 200 MG capsule Take 200 mg by mouth daily as needed.   Cholecalciferol (VITAMIN D3) 2000 UNITS TABS Take by mouth. 2000 iu in the am, 1000 iu in the pm   finasteride (PROSCAR) 5 MG tablet Take 5 mg by mouth daily.   FLUZONE HIGH-DOSE QUADRIVALENT 0.7 ML SUSY    gabapentin (NEURONTIN) 300 MG capsule Take 300 mg by mouth at bedtime.   hydrALAZINE (APRESOLINE) 25 MG tablet TAKE 1 TABLET BY MOUTH THREE TIMES A DAY   nitroGLYCERIN (NITROSTAT) 0.4 MG SL tablet Place 1 tablet (0.4 mg total) under the tongue every 5 (five) minutes as needed for chest pain.   omeprazole (PRILOSEC) 20 MG capsule Take 1 capsule (20 mg total) by mouth daily.   rosuvastatin (CRESTOR) 20 MG tablet TAKE HALF A TABLET (10 MG) BY MOUTH ONCE DAILY.   tamsulosin (FLOMAX) 0.4 MG CAPS capsule Take 0.4 mg by mouth 2 (two) times daily.   Testosterone 20.25 MG/ACT (1.62%) GEL SMARTSIG:2 Pump Topical Daily   valsartan (DIOVAN) 160 MG tablet TAKE 1 TABLET BY MOUTH EVERY DAY   vitamin C (ASCORBIC ACID) 500 MG tablet Take 500 mg by mouth 2 (two) times daily.   No facility-administered encounter medications on file as of 06/29/2020.   Pharmacist Review  Reviewed chart for medication changes and adherence.  No OVs, Consults, or hospital visits since last care coordination call / Pharmacist visit. No medication changes indicated  No gaps in adherence identified. Patient has follow up scheduled with pharmacy team. No further action required.   [place] Pharmacist Assistant [phone]   Time spent:5

## 2020-07-26 ENCOUNTER — Telehealth: Payer: Self-pay | Admitting: Pharmacist

## 2020-07-26 NOTE — Progress Notes (Signed)
Chronic Care Management Pharmacy Assistant   Name: Patrick Harris  MRN: [id number] DOB: Feb 13, 1945   Reason for Encounter: Disease State   Conditions to be addressed/monitored: HTN   Recent office visits:  None ID  Recent consult visits:  None ID  Hospital visits:  None in previous 6 months  Medications: Outpatient Encounter Medications as of 07/26/2020  Medication Sig   acetaminophen (TYLENOL) 500 MG tablet Take 500 mg by mouth every 6 (six) hours as needed.   aspirin EC 81 MG tablet Take 1 tablet (81 mg total) by mouth at bedtime.   bromocriptine (PARLODEL) 5 MG capsule Take 15 mg by mouth daily.   celecoxib (CELEBREX) 200 MG capsule Take 200 mg by mouth daily as needed.   Cholecalciferol (VITAMIN D3) 2000 UNITS TABS Take by mouth. 2000 iu in the am, 1000 iu in the pm   finasteride (PROSCAR) 5 MG tablet Take 5 mg by mouth daily.   FLUZONE HIGH-DOSE QUADRIVALENT 0.7 ML SUSY    gabapentin (NEURONTIN) 300 MG capsule Take 300 mg by mouth at bedtime.   hydrALAZINE (APRESOLINE) 25 MG tablet TAKE 1 TABLET BY MOUTH THREE TIMES A DAY   nitroGLYCERIN (NITROSTAT) 0.4 MG SL tablet Place 1 tablet (0.4 mg total) under the tongue every 5 (five) minutes as needed for chest pain.   omeprazole (PRILOSEC) 20 MG capsule Take 1 capsule (20 mg total) by mouth daily.   rosuvastatin (CRESTOR) 20 MG tablet TAKE HALF A TABLET (10 MG) BY MOUTH ONCE DAILY.   tamsulosin (FLOMAX) 0.4 MG CAPS capsule Take 0.4 mg by mouth 2 (two) times daily.   Testosterone 20.25 MG/ACT (1.62%) GEL SMARTSIG:2 Pump Topical Daily   valsartan (DIOVAN) 160 MG tablet TAKE 1 TABLET BY MOUTH EVERY DAY   vitamin C (ASCORBIC ACID) 500 MG tablet Take 500 mg by mouth 2 (two) times daily.   No facility-administered encounter medications on file as of 07/26/2020.    Pharmacist Review Reviewed chart prior to disease state call. Spoke with patient regarding BP  Recent Office Vitals: BP Readings from Last 3 Encounters:   03/27/20 140/78  03/06/20 (!) 150/70  02/15/20 (!) 180/82   Pulse Readings from Last 3 Encounters:  03/27/20 67  03/06/20 60  02/15/20 (!) 58    Wt Readings from Last 3 Encounters:  03/27/20 191 lb (86.6 kg)  03/06/20 191 lb 12.8 oz (87 kg)  02/15/20 188 lb (85.3 kg)     Kidney Function Lab Results  Component Value Date/Time   CREATININE 0.97 03/27/2020 11:15 AM   CREATININE 1.07 08/25/2019 08:47 AM   CREATININE 0.90 05/05/2019 08:34 AM   GFR 76.80 03/27/2020 11:15 AM   GFRNONAA >60 11/15/2016 02:10 PM   GFRAA >60 11/15/2016 02:10 PM    BMP Latest Ref Rng & Units 03/27/2020 08/25/2019 05/05/2019  Glucose 70 - 99 mg/dL 104(H) 97 -  BUN 6 - 23 mg/dL 18 13 -  Creatinine 0.40 - 1.50 mg/dL 0.97 1.07 0.90  BUN/Creat Ratio 6 - 22 (calc) - NOT APPLICABLE -  Sodium 631 - 145 mEq/L 137 138 -  Potassium 3.5 - 5.1 mEq/L 4.3 4.6 -  Chloride 96 - 112 mEq/L 103 103 -  CO2 19 - 32 mEq/L 27 30 -  Calcium 8.4 - 10.5 mg/dL 9.8 9.3 -    Current antihypertensive regimen:  Valsartan 160 mg daily Hydralazine 25 mg TID  How often are you checking your Blood Pressure? 1-2x per week  Current home BP readings:  Patient states blood pressure was 131/70 today. States that at night blood pressure goes down when he is resting.   What recent interventions/DTPs have been made by any provider to improve Blood Pressure control since last CPP Visit: None ID  Any recent hospitalizations or ED visits since last visit with CPP? No  What diet changes have been made to improve Blood Pressure Control?  Patient states that he does not ad any salt to his food What exercise is being done to improve your Blood Pressure Control?  Patient states that he does some walking, but sometimes his legs hurt. He states that he was taking Celebrex and gabapentin but it made him sick so he stop taking. He states he sees Dr. Quay Burow next month and will discuss if he needs to go back on them then.  Adherence Review: Is the  patient currently on ACE/ARB medication? Yes Does the patient have >5 day gap between last estimated fill dates? No  Valsartan 160 mg daily Hydralazine 25 mg TID  Star Rating Drugs: Rosuvastatin 20 mg 05/15/20 90 ds  Valsartan 160 mg 07/19/20 90 ds  Ethelene Hal Clinical Pharmacist Assistant [phone]

## 2020-08-11 DIAGNOSIS — H2513 Age-related nuclear cataract, bilateral: Secondary | ICD-10-CM | POA: Diagnosis not present

## 2020-08-14 DIAGNOSIS — R3912 Poor urinary stream: Secondary | ICD-10-CM | POA: Diagnosis not present

## 2020-08-25 ENCOUNTER — Telehealth: Payer: Self-pay | Admitting: Pharmacist

## 2020-08-25 NOTE — Progress Notes (Signed)
    Chronic Care Management Pharmacy Assistant   Name: CINQUE BEGLEY  MRN: [id number] DOB: January 19, 1945  Error

## 2020-08-28 NOTE — Patient Instructions (Addendum)
Blood work was ordered.     Medications changes include :   zpak for your sinus infection  Your prescription(s) have been submitted to your pharmacy. Please take as directed and contact our office if you believe you are having problem(s) with the medication(s).   A referral was ordered for  [facility] neurology - Dr Posey Pronto and vascular surgery.  Someone from their offices will call you to schedule an appointment.    Please followup in 1 year   Health Maintenance, Male Adopting a healthy lifestyle and getting preventive care are important in promoting health and wellness. Ask your health care provider about: The right schedule for you to have regular tests and exams. Things you can do on your own to prevent diseases and keep yourself healthy. What should I know about diet, weight, and exercise? Eat a healthy diet  Eat a diet that includes plenty of vegetables, fruits, low-fat dairy products, and lean protein. Do not eat a lot of foods that are high in solid fats, added sugars, or sodium.  Maintain a healthy weight Body mass index (BMI) is a measurement that can be used to identify possible weight problems. It estimates body fat based on height and weight. Your health care provider can help determine your BMI and help you achieve or maintain ahealthy weight. Get regular exercise Get regular exercise. This is one of the most important things you can do for your health. Most adults should: Exercise for at least 150 minutes each week. The exercise should increase your heart rate and make you sweat (moderate-intensity exercise). Do strengthening exercises at least twice a week. This is in addition to the moderate-intensity exercise. Spend less time sitting. Even light physical activity can be beneficial. Watch cholesterol and blood lipids Have your blood tested for lipids and cholesterol at 75 years of age, then havethis test every 5 years. You may need to have your cholesterol levels  checked more often if: Your lipid or cholesterol levels are high. You are older than 75 years of age. You are at high risk for heart disease. What should I know about cancer screening? Many types of cancers can be detected early and may often be prevented. Depending on your health history and family history, you may need to have cancer screening at various ages. This may include screening for: Colorectal cancer. Prostate cancer. Skin cancer. Lung cancer. What should I know about heart disease, diabetes, and high blood pressure? Blood pressure and heart disease High blood pressure causes heart disease and increases the risk of stroke. This is more likely to develop in people who have high blood pressure readings, are of African descent, or are overweight. Talk with your health care provider about your target blood pressure readings. Have your blood pressure checked: Every 3-5 years if you are 74-38 years of age. Every year if you are 52 years old or older. If you are between the ages of 93 and 34 and are a current or former smoker, ask your health care provider if you should have a one-time screening for abdominal aortic aneurysm (AAA). Diabetes Have regular diabetes screenings. This checks your fasting blood sugar level. Have the screening done: Once every three years after age 23 if you are at a normal weight and have a low risk for diabetes. More often and at a younger age if you are overweight or have a high risk for diabetes. What should I know about preventing infection? Hepatitis B If you have a higher risk for  hepatitis B, you should be screened for this virus. Talk with your health care provider to find out if you are at risk forhepatitis B infection. Hepatitis C Blood testing is recommended for: Everyone born from [birth year] through 1965. Anyone with known risk factors for hepatitis C. Sexually transmitted infections (STIs) You should be screened each year for STIs, including  gonorrhea and chlamydia, if: You are sexually active and are younger than 75 years of age. You are older than 75 years of age and your health care provider tells you that you are at risk for this type of infection. Your sexual activity has changed since you were last screened, and you are at increased risk for chlamydia or gonorrhea. Ask your health care provider if you are at risk. Ask your health care provider about whether you are at high risk for HIV. Your health care provider may recommend a prescription medicine to help prevent HIV infection. If you choose to take medicine to prevent HIV, you should first get tested for HIV. You should then be tested every 3 months for as long as you are taking the medicine. Follow these instructions at home: Lifestyle Do not use any products that contain nicotine or tobacco, such as cigarettes, e-cigarettes, and chewing tobacco. If you need help quitting, ask your health care provider. Do not use street drugs. Do not share needles. Ask your health care provider for help if you need support or information about quitting drugs. Alcohol use Do not drink alcohol if your health care provider tells you not to drink. If you drink alcohol: Limit how much you have to 0-2 drinks a day. Be aware of how much alcohol is in your drink. In the [place] one drink equals one 12 oz bottle of beer (355 mL), one 5 oz glass of wine (148 mL), or one 1 oz glass of hard liquor (44 mL). General instructions Schedule regular health, dental, and eye exams. Stay current with your vaccines. Tell your health care provider if: You often feel depressed. You have ever been abused or do not feel safe at home. Summary Adopting a healthy lifestyle and getting preventive care are important in promoting health and wellness. Follow your health care provider's instructions about healthy diet, exercising, and getting tested or screened for diseases. Follow your health care provider's  instructions on monitoring your cholesterol and blood pressure. This information is not intended to replace advice given to you by your health care provider. Make sure you discuss any questions you have with your healthcare provider. Document Revised: 12/17/2017 Document Reviewed: 12/17/2017 Elsevier Patient Education  2022 [employer].

## 2020-08-28 NOTE — Progress Notes (Signed)
Subjective:    Patient ID: Patrick Harris, male    DOB: Nov 22, 1945, 75 y.o.   MRN: [id number]   This visit occurred during the SARS-CoV-2 public health emergency.  Safety protocols were in place, including screening questions prior to the visit, additional usage of staff PPE, and extensive cleaning of exam room while observing appropriate contact time as indicated for disinfecting solutions.   HPI He is here for a physical exam.   He saw the neck surgeon and was prescribed celebrex but it ended up hurting his stomach and had to stop it     He has a burning sensation in the medial aspect of his left leg.  If he walks far his legs get tired and achy and he does not walk far.  No left lower back pain.    3 months ago - started with nasal congestion - mucus is not colored.  He tried nasacort.  His taste is off.  He typically does not have allergies.     Medications and allergies reviewed with patient and updated if appropriate.  Patient Active Problem List   Diagnosis Date Noted   Acute sinus infection 08/29/2020   Aortic atherosclerosis ([place]) 08/25/2019   Inguinal hernia 08/25/2019   Chronic periumbilical pain 38/46/6599   Claudication ([place]) 08/24/2018   Neck pain 03/02/2018   Neuralgia 03/02/2018   Prediabetes 35/70/1779   Umbilical hernia 39/03/90   Varicose veins of both lower extremities 05/22/2016   Vitamin D deficiency 08/21/2015   Hypertension 01/24/2015   Tingling in extremities 01/24/2015   Family history of prostate cancer 08/19/2014   Osteopenia 08/07/2009   Hyperlipidemia 08/12/2007   PITUITARY INSUFFICIENCY 08/05/2007   Testosterone deficiency 08/05/2007   Coronary atherosclerosis 08/05/2007   GERD 08/05/2007   BPH without obstruction/lower urinary tract symptoms 08/05/2007    Current Outpatient Medications on File Prior to Visit  Medication Sig Dispense Refill   acetaminophen (TYLENOL) 500 MG tablet Take 500 mg by mouth every 6 (six) hours as  needed.     aspirin EC 81 MG tablet Take 1 tablet (81 mg total) by mouth at bedtime.     bromocriptine (PARLODEL) 5 MG capsule Take 15 mg by mouth daily.     Cholecalciferol (VITAMIN D3) 2000 UNITS TABS Take by mouth. 2000 iu in the am, 1000 iu in the pm     Coenzyme Q10 (COQ-10) 100 MG CAPS Take by mouth.     Garlic 3300 MG CAPS Take by mouth.     Iodine, Kelp, (KELP PO) Take 225 mcg by mouth.     Magnesium 250 MG TABS Take by mouth.     Menaquinone-7 (VITAMIN K2) 100 MCG CAPS Take by mouth.     nitroGLYCERIN (NITROSTAT) 0.4 MG SL tablet Place 1 tablet (0.4 mg total) under the tongue every 5 (five) minutes as needed for chest pain. 25 tablet 3   Omega-3 Fatty Acids (OMEGA-3 FISH OIL PO) Take 900 mg by mouth.     tamsulosin (FLOMAX) 0.4 MG CAPS capsule Take 0.4 mg by mouth 2 (two) times daily.     Testosterone 20.25 MG/ACT (1.62%) GEL SMARTSIG:2 Pump Topical Daily     Turmeric (QC TUMERIC COMPLEX PO) Take 1,000 mg by mouth.     vitamin C (ASCORBIC ACID) 500 MG tablet Take 500 mg by mouth 2 (two) times daily.     No current facility-administered medications on file prior to visit.    Past Medical History:  Diagnosis Date  Arthritis    CAD (coronary artery disease)    CABG with LIMA to LAD and RIMA to RCA August 2000 - Dr. Roxan Hockey   Cancer [facility])    basal cell   CHF (congestive heart failure) (HCC)    COPD (chronic obstructive pulmonary disease) (HCC)    Elevated glycated hemoglobin    Essential hypertension    GERD (gastroesophageal reflux disease)    Heart murmur    Hyperlipidemia    Hypopituitarism (HCC)    Osteopenia    Dr Wilson Singer   Pancreatitis    Peyronie disease    Testosterone deficiency    Dr Wilson Singer    Past Surgical History:  Procedure Laterality Date   Bone spur  2006   5th toe bilaterally   COLONOSCOPY  2007   negative; Dr Sharlett Iles   CORONARY ARTERY BYPASS GRAFT  2001   2 vessels   fracture heel  1967   Bil/ due to fall off ladder   Fractured calcaneus   1969   bilaterally w/ ankle fusion   [place]   right inguinal hernia   KNEE SURGERY Left    MENISECTOMY  2007   R medial, Dr. Lorin Mercy   SHOULDER SURGERY  1994   left   UPPER GASTROINTESTINAL ENDOSCOPY  2007   GERD, Dr Sharlett Iles   VASECTOMY      Social History   Socioeconomic History   Marital status: Married    Spouse name: Not on file   Number of children: 1   Years of education: Not on file   Highest education level: Not on file  Occupational History   Occupation: retired  Tobacco Use   Smoking status: Former    Types: Cigarettes    Quit date: 01/07/1998    Years since quitting: 22.6   Smokeless tobacco: Never   Tobacco comments:    smoked 1963- 2000, up to 1 ppd  Vaping Use   Vaping Use: Never used  Substance and Sexual Activity   Alcohol use: No   Drug use: No   Sexual activity: Yes  Other Topics Concern   Not on file  Social History Narrative   Not on file   Social Determinants of Health   Financial Resource Strain: Low Risk    Difficulty of Paying Living Expenses: Not hard at all  Food Insecurity: Not on file  Transportation Needs: Not on file  Physical Activity: Not on file  Stress: Not on file  Social Connections: Not on file    Family History  Problem Relation Age of Onset   Heart attack Father 28   Diabetes Mother    Stroke Mother 13   Hypertension Mother    Leukemia Sister    Colon cancer Paternal Uncle    Aortic aneurysm Brother        abdominal   Heart disease Brother    Atrial fibrillation Brother    Cancer Maternal Uncle         X3:prostate , renal, bone    Parkinson's disease Sister    Stroke Sister    Stroke Sister    Stroke Sister    Atrial fibrillation Sister    Esophageal cancer Neg Hx    Rectal cancer Neg Hx    Stomach cancer Neg Hx     Review of Systems  Constitutional:  Negative for fever.  HENT:  Positive for congestion. Negative for ear pain, postnasal drip, sinus pain and sore throat.   Eyes:  Negative  for visual disturbance.  Respiratory:  Positive for wheezing (occ). Negative for cough and shortness of breath.   Cardiovascular:  Negative for chest pain, palpitations and leg swelling.  Gastrointestinal:  Positive for nausea (related to gastritis - celebrex - improving). Negative for abdominal pain, blood in stool (no melena), constipation and diarrhea.  Genitourinary:  Negative for dysuria and hematuria.  Musculoskeletal:  Positive for arthralgias (right wrist) and neck pain.  Skin:  Negative for color change and rash.  Neurological:  Negative for dizziness, light-headedness and headaches.  Psychiatric/Behavioral:  Negative for dysphoric mood. The patient is not nervous/anxious.       Objective:   Vitals:   08/29/20 0812  BP: 138/80  Pulse: (!) 53  Temp: 98.1 F (36.7 C)  SpO2: 95%   Filed Weights   08/29/20 0812  Weight: 182 lb (82.6 kg)   Body mass index is 24.68 kg/m.  BP Readings from Last 3 Encounters:  08/29/20 138/80  03/27/20 140/78  03/06/20 (!) 150/70    Wt Readings from Last 3 Encounters:  08/29/20 182 lb (82.6 kg)  03/27/20 191 lb (86.6 kg)  03/06/20 191 lb 12.8 oz (87 kg)     Depression screen [facility] 2/9 08/29/2020 08/25/2019 07/20/2018 03/02/2018 08/22/2017  Decreased Interest 0 0 0 0 0  Down, Depressed, Hopeless 0 0 0 0 0  PHQ - 2 Score 0 0 0 0 0  Altered sleeping 0 - - - -  Tired, decreased energy 0 - - - -  Change in appetite 0 - - - -  Feeling bad or failure about yourself  0 - - - -  Trouble concentrating 0 - - - -  Moving slowly or fidgety/restless 0 - - - -  Suicidal thoughts 0 - - - -  PHQ-9 Score 0 - - - -  Difficult doing work/chores Not difficult at all - - - -    GAD 7 : Generalized Anxiety Score 08/29/2020  Nervous, Anxious, on Edge 0  Control/stop worrying 0  Worry too much - different things 0  Trouble relaxing 0  Restless 0  Easily annoyed or irritable 0  Afraid - awful might happen 0  Total GAD 7 Score 0  Anxiety Difficulty Not  difficult at all       Physical Exam Constitutional: He appears well-developed and well-nourished. No distress.  HENT:  Head: Normocephalic and atraumatic.  Right Ear: External ear normal.  Left Ear: External ear normal.  Mouth/Throat: Oropharynx is clear and moist.  Normal ear canals and TM b/l  Eyes: Conjunctivae and EOM are normal.  Neck: Neck supple. No tracheal deviation present. No thyromegaly present.  No carotid bruit  Cardiovascular: Normal rate, regular rhythm, normal heart sounds and intact distal pulses.   No murmur heard. Pulmonary/Chest: Effort normal and breath sounds normal. No respiratory distress. He has no wheezes. He has no rales.  Abdominal: Soft. He exhibits no distension. There is no tenderness.  Genitourinary: deferred  Musculoskeletal: He exhibits no edema.  Lymphadenopathy:   He has no cervical adenopathy.  Skin: Skin is warm and dry. He is not diaphoretic.  Psychiatric: He has a normal mood and affect. His behavior is normal.         Assessment & Plan:   Physical exam: Screening blood work  ordered Exercise   arm exercise - does not walk much due to leg discomfort with walking Weight   normal Substance abuse   none   Screened for depression using the  PHQ 9 scale.  No evidence of depression.   Screened for anxiety using GAD7 Scale.  No evidence of anxiety.     Reviewed recommended immunizations.   Health Maintenance  Topic Date Due   Zoster Vaccines- Shingrix (1 of 2) Never done   COVID-19 Vaccine (4 - Booster for Moderna series) 12/16/2019   INFLUENZA VACCINE  08/07/2020   COLONOSCOPY (Pts 45-54yrs Insurance coverage will need to be confirmed)  08/27/2021   TETANUS/TDAP  09/29/2021   Hepatitis C Screening  Completed   PNA vac Low Risk Adult  Completed   HPV VACCINES  Aged Out     See Problem List for Assessment and Plan of chronic medical problems.

## 2020-08-29 ENCOUNTER — Encounter: Payer: Self-pay | Admitting: Internal Medicine

## 2020-08-29 ENCOUNTER — Other Ambulatory Visit: Payer: Self-pay

## 2020-08-29 ENCOUNTER — Ambulatory Visit (INDEPENDENT_AMBULATORY_CARE_PROVIDER_SITE_OTHER): Payer: Medicare Other | Admitting: Internal Medicine

## 2020-08-29 VITALS — BP 138/80 | HR 53 | Temp 98.1°F | Ht 72.0 in | Wt 182.0 lb

## 2020-08-29 DIAGNOSIS — I10 Essential (primary) hypertension: Secondary | ICD-10-CM

## 2020-08-29 DIAGNOSIS — M792 Neuralgia and neuritis, unspecified: Secondary | ICD-10-CM | POA: Diagnosis not present

## 2020-08-29 DIAGNOSIS — I700 Atherosclerosis of aorta: Secondary | ICD-10-CM | POA: Diagnosis not present

## 2020-08-29 DIAGNOSIS — E782 Mixed hyperlipidemia: Secondary | ICD-10-CM | POA: Diagnosis not present

## 2020-08-29 DIAGNOSIS — I739 Peripheral vascular disease, unspecified: Secondary | ICD-10-CM | POA: Diagnosis not present

## 2020-08-29 DIAGNOSIS — K219 Gastro-esophageal reflux disease without esophagitis: Secondary | ICD-10-CM | POA: Diagnosis not present

## 2020-08-29 DIAGNOSIS — Z0000 Encounter for general adult medical examination without abnormal findings: Secondary | ICD-10-CM

## 2020-08-29 DIAGNOSIS — Z1331 Encounter for screening for depression: Secondary | ICD-10-CM | POA: Diagnosis not present

## 2020-08-29 DIAGNOSIS — J019 Acute sinusitis, unspecified: Secondary | ICD-10-CM

## 2020-08-29 DIAGNOSIS — R7303 Prediabetes: Secondary | ICD-10-CM

## 2020-08-29 LAB — CBC WITH DIFFERENTIAL/PLATELET
Basophils Absolute: 0 10*3/uL (ref 0.0–0.1)
Basophils Relative: 0.4 % (ref 0.0–3.0)
Eosinophils Absolute: 0 10*3/uL (ref 0.0–0.7)
Eosinophils Relative: 0.8 % (ref 0.0–5.0)
HCT: 51.6 % (ref 39.0–52.0)
Hemoglobin: 17.2 g/dL — ABNORMAL HIGH (ref 13.0–17.0)
Lymphocytes Relative: 24.8 % (ref 12.0–46.0)
Lymphs Abs: 1.1 10*3/uL (ref 0.7–4.0)
MCHC: 33.4 g/dL (ref 30.0–36.0)
MCV: 92.7 fl (ref 78.0–100.0)
Monocytes Absolute: 0.5 10*3/uL (ref 0.1–1.0)
Monocytes Relative: 10.7 % (ref 3.0–12.0)
Neutro Abs: 2.9 10*3/uL (ref 1.4–7.7)
Neutrophils Relative %: 63.3 % (ref 43.0–77.0)
Platelets: 158 10*3/uL (ref 150.0–400.0)
RBC: 5.56 Mil/uL (ref 4.22–5.81)
RDW: 14.6 % (ref 11.5–15.5)
WBC: 4.6 10*3/uL (ref 4.0–10.5)

## 2020-08-29 LAB — LIPID PANEL
Cholesterol: 118 mg/dL (ref 0–200)
HDL: 33.6 mg/dL — ABNORMAL LOW (ref >39.00)
LDL Cholesterol: 65 mg/dL (ref 0–99)
NonHDL: 83.9
Total CHOL/HDL Ratio: 3
Triglycerides: 96 mg/dL (ref 0.0–149.0)
VLDL: 19.2 mg/dL (ref 0.0–40.0)

## 2020-08-29 LAB — HEMOGLOBIN A1C: Hgb A1c MFr Bld: 6 % (ref 4.6–6.5)

## 2020-08-29 LAB — COMPREHENSIVE METABOLIC PANEL
ALT: 16 U/L (ref 0–53)
AST: 19 U/L (ref 0–37)
Albumin: 4.2 g/dL (ref 3.5–5.2)
Alkaline Phosphatase: 47 U/L (ref 39–117)
BUN: 14 mg/dL (ref 6–23)
CO2: 27 mEq/L (ref 19–32)
Calcium: 9.4 mg/dL (ref 8.4–10.5)
Chloride: 103 mEq/L (ref 96–112)
Creatinine, Ser: 0.99 mg/dL (ref 0.40–1.50)
GFR: 74.72 mL/min (ref >60.00)
Glucose, Bld: 94 mg/dL (ref 70–99)
Potassium: 4.4 mEq/L (ref 3.5–5.1)
Sodium: 137 mEq/L (ref 135–145)
Total Bilirubin: 0.7 mg/dL (ref 0.2–1.2)
Total Protein: 7.3 g/dL (ref 6.0–8.3)

## 2020-08-29 LAB — TSH: TSH: 1.63 u[IU]/mL (ref 0.35–5.50)

## 2020-08-29 MED ORDER — HYDRALAZINE HCL 25 MG PO TABS: 25.0000 mg | ORAL_TABLET | Freq: Three times a day (TID) | ORAL | 3 refills | Status: DC

## 2020-08-29 MED ORDER — ROSUVASTATIN CALCIUM 20 MG PO TABS: ORAL_TABLET | ORAL | 3 refills | Status: DC

## 2020-08-29 MED ORDER — VALSARTAN 160 MG PO TABS: 160.0000 mg | ORAL_TABLET | Freq: Every day | ORAL | 3 refills | Status: DC

## 2020-08-29 MED ORDER — OMEPRAZOLE 20 MG PO CPDR: 20.0000 mg | DELAYED_RELEASE_CAPSULE | Freq: Every day | ORAL | 3 refills | Status: DC

## 2020-08-29 MED ORDER — AZITHROMYCIN 250 MG PO TABS: ORAL_TABLET | ORAL | 0 refills | Status: DC

## 2020-08-29 NOTE — Assessment & Plan Note (Signed)
Chronic For years he has had a burning type pain on the medial aspect of his left distal upper 75-year-old lower leg to big toe He was told by the back surgeon that his back was good and likely not the cause of this Will refer to neurology for possible EMG

## 2020-08-29 NOTE — Assessment & Plan Note (Signed)
Chronic GERD controlled Continue omeprazole 20 mg qd

## 2020-08-29 NOTE — Assessment & Plan Note (Signed)
Acute Likely bacterial  Start Z-Pak and take as directed otc cold medications Rest, fluid Call if no improvement

## 2020-08-29 NOTE — Assessment & Plan Note (Signed)
Chronic Bilateral legs with achiness and fatigue with walking long distances.  Improves with rest. Has been evaluated by neurosurgery and told his lower back was good ?  Vascular nature-refer to vascular to rule out PAD

## 2020-08-29 NOTE — Assessment & Plan Note (Signed)
Chronic BP well controlled Continue hydralazine 25 mg TID, valsartan 160 mg qd cmp

## 2020-08-29 NOTE — Assessment & Plan Note (Signed)
Chronic Check a1c Low sugar / carb diet Stressed regular exercise  

## 2020-08-29 NOTE — Assessment & Plan Note (Addendum)
Chronic Check lipid panel, cmp Continue crestor 10 mg qd Regular exercise and healthy diet encouraged

## 2020-08-29 NOTE — Assessment & Plan Note (Signed)
Chronic Check lipids today Lab Results  Component Value Date   LDLCALC 71 08/25/2019   LDL well controlled Continue crestor 20 mg qd Advised healthy diet and regular exercise

## 2020-08-30 ENCOUNTER — Encounter: Payer: Self-pay | Admitting: Neurology

## 2020-09-18 ENCOUNTER — Ambulatory Visit (INDEPENDENT_AMBULATORY_CARE_PROVIDER_SITE_OTHER): Payer: Medicare Other

## 2020-09-18 ENCOUNTER — Other Ambulatory Visit: Payer: Self-pay

## 2020-09-18 VITALS — BP 130/78 | HR 59 | Temp 98.2°F | Ht 72.0 in | Wt 182.6 lb

## 2020-09-18 DIAGNOSIS — Z0000 Encounter for general adult medical examination without abnormal findings: Secondary | ICD-10-CM

## 2020-09-18 NOTE — Progress Notes (Signed)
Subjective:   Patrick Harris is a 75 y.o. male who presents for Medicare Annual/Subsequent preventive examination.  Review of Systems     Cardiac Risk Factors include: advanced age (>43men, >26 women);dyslipidemia;family history of premature cardiovascular disease;hypertension;male gender     Objective:    Today's Vitals   09/18/20 0928  BP: 130/78  Pulse: (!) 59  Temp: 98.2 F (36.8 C)  SpO2: 94%  Weight: 182 lb 9.6 oz (82.8 kg)  Height: 6' (1.829 m)  PainSc: 0-No pain   Body mass index is 24.77 kg/m.  Advanced Directives 09/18/2020 07/20/2018 01/25/2018 11/15/2016 05/18/2016 04/08/2016 01/18/2015  Does Patient Have a Medical Advance Directive? Yes Yes Yes No Yes No Yes  Type of Advance Directive Living will;Healthcare Power of [place];Living will [place];Living will - - - [place];Living will  Does patient want to make changes to medical advance directive? No - Patient declined - - - - - -  Copy of [place] in Chart? No - copy requested No - copy requested - - - - -  Would patient like information on creating a medical advance directive? - - - No - Patient declined - - -    Current Medications (verified) Outpatient Encounter Medications as of 09/18/2020  Medication Sig   acetaminophen (TYLENOL) 500 MG tablet Take 500 mg by mouth every 6 (six) hours as needed.   aspirin EC 81 MG tablet Take 1 tablet (81 mg total) by mouth at bedtime.   azithromycin (ZITHROMAX) 250 MG tablet Take two tabs the first day and then one tab daily for four days   bromocriptine (PARLODEL) 5 MG capsule Take 15 mg by mouth daily.   Cholecalciferol (VITAMIN D3) 2000 UNITS TABS Take by mouth. 2000 iu in the am, 1000 iu in the pm   Coenzyme Q10 (COQ-10) 100 MG CAPS Take by mouth.   Garlic 8841 MG CAPS Take by mouth.   hydrALAZINE (APRESOLINE) 25 MG tablet Take 1 tablet (25 mg total) by mouth 3 (three) times daily.    Iodine, Kelp, (KELP PO) Take 225 mcg by mouth.   Magnesium 250 MG TABS Take by mouth.   Menaquinone-7 (VITAMIN K2) 100 MCG CAPS Take by mouth.   nitroGLYCERIN (NITROSTAT) 0.4 MG SL tablet Place 1 tablet (0.4 mg total) under the tongue every 5 (five) minutes as needed for chest pain.   Omega-3 Fatty Acids (OMEGA-3 FISH OIL PO) Take 900 mg by mouth.   omeprazole (PRILOSEC) 20 MG capsule Take 1 capsule (20 mg total) by mouth daily.   rosuvastatin (CRESTOR) 20 MG tablet TAKE HALF A TABLET (10 MG) BY MOUTH ONCE DAILY.   tamsulosin (FLOMAX) 0.4 MG CAPS capsule Take 0.4 mg by mouth 2 (two) times daily.   Testosterone 20.25 MG/ACT (1.62%) GEL SMARTSIG:2 Pump Topical Daily   Turmeric (QC TUMERIC COMPLEX PO) Take 1,000 mg by mouth.   valsartan (DIOVAN) 160 MG tablet Take 1 tablet (160 mg total) by mouth daily.   vitamin C (ASCORBIC ACID) 500 MG tablet Take 500 mg by mouth 2 (two) times daily.   No facility-administered encounter medications on file as of 09/18/2020.    Allergies (verified) Latex, Lisinopril, Amlodipine, Hydrochlorothiazide, and Simvastatin   History: Past Medical History:  Diagnosis Date   Arthritis    CAD (coronary artery disease)    CABG with LIMA to LAD and RIMA to RCA August 2000 - Dr. Roxan Hockey   Cancer [facility])  basal cell   CHF (congestive heart failure) (HCC)    COPD (chronic obstructive pulmonary disease) (HCC)    Elevated glycated hemoglobin    Essential hypertension    GERD (gastroesophageal reflux disease)    Heart murmur    Hyperlipidemia    Hypopituitarism (HCC)    Osteopenia    Dr Wilson Singer   Pancreatitis    Peyronie disease    Testosterone deficiency    Dr Wilson Singer   Past Surgical History:  Procedure Laterality Date   Bone spur  2006   5th toe bilaterally   COLONOSCOPY  2007   negative; Dr Sharlett Iles   CORONARY ARTERY BYPASS GRAFT  2001   2 vessels   fracture heel  1967   Bil/ due to fall off ladder   Fractured calcaneus  1969   bilaterally w/  ankle fusion   [place]   right inguinal hernia   KNEE SURGERY Left    MENISECTOMY  2007   R medial, Dr. Lorin Mercy   SHOULDER SURGERY  1994   left   UPPER GASTROINTESTINAL ENDOSCOPY  2007   GERD, Dr Sharlett Iles   VASECTOMY     Family History  Problem Relation Age of Onset   Heart attack Father 33   Diabetes Mother    Stroke Mother 52   Hypertension Mother    Leukemia Sister    Colon cancer Paternal Uncle    Aortic aneurysm Brother        abdominal   Heart disease Brother    Atrial fibrillation Brother    Cancer Maternal Uncle         X3:prostate , renal, bone    Parkinson's disease Sister    Stroke Sister    Stroke Sister    Stroke Sister    Atrial fibrillation Sister    Esophageal cancer Neg Hx    Rectal cancer Neg Hx    Stomach cancer Neg Hx    Social History   Socioeconomic History   Marital status: Married    Spouse name: Not on file   Number of children: 1   Years of education: Not on file   Highest education level: Not on file  Occupational History   Occupation: retired  Tobacco Use   Smoking status: Former    Types: Cigarettes    Quit date: 01/07/1998    Years since quitting: 22.7   Smokeless tobacco: Never   Tobacco comments:    smoked 1963- 2000, up to 1 ppd  Vaping Use   Vaping Use: Never used  Substance and Sexual Activity   Alcohol use: No   Drug use: No   Sexual activity: Yes  Other Topics Concern   Not on file  Social History Narrative   Not on file   Social Determinants of Health   Financial Resource Strain: Low Risk    Difficulty of Paying Living Expenses: Not hard at all  Food Insecurity: No Food Insecurity   Worried About Charity fundraiser in the Last Year: Never true   [place] in the Last Year: Never true  Transportation Needs: No Transportation Needs   Lack of Transportation (Medical): No   Lack of Transportation (Non-Medical): No  Physical Activity: Sufficiently Active   Days of Exercise per Week: 5 days    Minutes of Exercise per Session: 30 min  Stress: No Stress Concern Present   Feeling of Stress : Not at all  Social Connections: Socially Integrated   Frequency  of Communication with Friends and Family: More than three times a week   Frequency of Social Gatherings with Friends and Family: More than three times a week   Attends Religious Services: More than 4 times per year   Active Member of [employer] or Organizations: Yes   Attends Music therapist: More than 4 times per year   Marital Status: Married    Tobacco Counseling Counseling given: Not Answered Tobacco comments: smoked 1963- 2000, up to 1 ppd   Clinical Intake:  Pre-visit preparation completed: Yes  Pain : No/denies pain Pain Score: 0-No pain     BMI - recorded: 24.77 Nutritional Status: BMI of 19-24  Normal Nutritional Risks: None Diabetes: No  How often do you need to have someone help you when you read instructions, pamphlets, or other written materials from your doctor or pharmacy?: 1 - Never What is the last grade level you completed in school?: Electrical Repair Degree  Diabetic? no  Interpreter Needed?: No  Information entered by :: Lisette Abu, LPN   Activities of Daily Living In your present state of health, do you have any difficulty performing the following activities: 09/18/2020 02/15/2020  Hearing? N N  Vision? N N  Difficulty concentrating or making decisions? N N  Walking or climbing stairs? N N  Dressing or bathing? N N  Doing errands, shopping? N N  Preparing Food and eating ? N -  Using the Toilet? N -  In the past six months, have you accidently leaked urine? N -  Do you have problems with loss of bowel control? N -  Managing your Medications? N -  Managing your Finances? N -  Housekeeping or managing your Housekeeping? N -  Some recent data might be hidden    Patient Care Team: [name], MD as PCP - General (Internal Medicine) Satira Sark, MD as PCP -  Cardiology (Cardiology) Ladene Artist, MD as Consulting Physician (Gastroenterology) Anda Kraft, MD as Consulting Physician (Endocrinology) Charlton Haws, [facility] as Pharmacist (Pharmacist) Luberta Mutter, MD as Consulting Physician (Ophthalmology)  Indicate any recent Medical Services you may have received from other than [facility] providers in the past year (date may be approximate).     Assessment:   This is a routine wellness examination for [place].  Hearing/Vision screen Hearing Screening - Comments:: Patient denied any hearing difficulty. Vision Screening - Comments:: Patient wears eyeglasses.  Cataract surgery scheduled for 10/26/2020 with Dr. Luberta Mutter.  Dietary issues and exercise activities discussed: Current Exercise Habits: Home exercise routine, Type of exercise: walking;stretching;Other - see comments (arm & neck exercises), Time (Minutes): 30, Frequency (Times/Week): 5, Weekly Exercise (Minutes/Week): 150, Intensity: Moderate, Exercise limited by: neurologic condition(s);orthopedic condition(s)   Goals Addressed   None   Depression Screen PHQ 2/9 Scores 09/18/2020 08/29/2020 08/25/2019 07/20/2018 03/02/2018 08/22/2017 08/21/2016  PHQ - 2 Score 0 0 0 0 0 0 0  PHQ- 9 Score - 0 - - - - -    Fall Risk Fall Risk  09/18/2020 08/29/2020 08/25/2019 07/20/2018 03/02/2018  Falls in the past year? 0 0 0 0 0  Number falls in past yr: 0 0 0 0 -  Injury with Fall? 0 0 0 - -  Risk for fall due to : No Fall Risks No Fall Risks No Fall Risks - -  Follow up Falls evaluation completed Falls evaluation completed - - -    FALL RISK PREVENTION PERTAINING TO THE HOME:  Any stairs in or around  the home? Yes  If so, are there any without handrails? No  Home free of loose throw rugs in walkways, pet beds, electrical cords, etc? Yes  Adequate lighting in your home to reduce risk of falls? Yes   ASSISTIVE DEVICES UTILIZED TO PREVENT FALLS:  Life alert? No  Use of a cane, walker or  w/c? No  Grab bars in the bathroom? No  Shower chair or bench in shower? No  Elevated toilet seat or a handicapped toilet? No   TIMED UP AND GO:  Was the test performed? Yes .  Length of time to ambulate 10 feet: 5 sec.   Gait steady and fast without use of assistive device  Cognitive Function: Normal cognitive status assessed by direct observation by this Nurse Health Advisor. No abnormalities found.          Immunizations Immunization History  Administered Date(s) Administered   Influenza Whole 10/07/2007, 10/17/2008   Influenza, High Dose Seasonal PF 10/16/2012, 10/15/2013, 10/20/2017, 10/19/2018, 11/03/2019   Influenza-Unspecified 10/25/2015, 10/14/2016   Moderna Sars-Covid-2 Vaccination 01/26/2019, 02/23/2019, 09/16/2019   Pneumococcal Conjugate-13 08/19/2014   Pneumococcal Polysaccharide-23 08/09/2010   Td 01/07/2001   Tdap 09/30/2011    TDAP status: Up to date  Flu Vaccine status: Up to date  Pneumococcal vaccine status: Up to date  Covid-19 vaccine status: Completed vaccines  Qualifies for Shingles Vaccine? Yes   Zostavax completed No   Shingrix Completed?: No.    Education has been provided regarding the importance of this vaccine. Patient has been advised to call insurance company to determine out of pocket expense if they have not yet received this vaccine. Advised may also receive vaccine at local pharmacy or Health Dept. Verbalized acceptance and understanding.  Screening Tests Health Maintenance  Topic Date Due   Zoster Vaccines- Shingrix (1 of 2) Never done   COVID-19 Vaccine (4 - Booster for Moderna series) 12/09/2019   INFLUENZA VACCINE  08/07/2020   COLONOSCOPY (Pts 45-74yrs Insurance coverage will need to be confirmed)  08/27/2021   TETANUS/TDAP  09/29/2021   Hepatitis C Screening  Completed   PNA vac Low Risk Adult  Completed   HPV VACCINES  Aged Out    Health Maintenance  Health Maintenance Due  Topic Date Due   Zoster Vaccines-  Shingrix (1 of 2) Never done   COVID-19 Vaccine (4 - Booster for Moderna series) 12/09/2019   INFLUENZA VACCINE  08/07/2020    Colorectal cancer screening: Type of screening: Colonoscopy. Completed 08/27/2016. Repeat every 5 years  Lung Cancer Screening: (Low Dose CT Chest recommended if Age 23-80 years, 30 pack-year currently smoking OR have quit w/in 15years.) does not qualify.   Lung Cancer Screening Referral: no  Additional Screening:  Hepatitis C Screening: does qualify; Completed yes  Vision Screening: Recommended annual ophthalmology exams for early detection of glaucoma and other disorders of the eye. Is the patient up to date with their annual eye exam?  Yes  Who is the provider or what is the name of the office in which the patient attends annual eye exams? Luberta Mutter, MD. If pt is not established with a provider, would they like to be referred to a provider to establish care? No .   Dental Screening: Recommended annual dental exams for proper oral hygiene  Community Resource Referral / Chronic Care Management: CRR required this visit?  No   CCM required this visit?  No      Plan:     I have personally reviewed and  noted the following in the patient's chart:   Medical and social history Use of alcohol, tobacco or illicit drugs  Current medications and supplements including opioid prescriptions. Patient is not currently taking opioid prescriptions. Functional ability and status Nutritional status Physical activity Advanced directives List of other physicians Hospitalizations, surgeries, and ER visits in previous 12 months Vitals Screenings to include cognitive, depression, and falls Referrals and appointments  In addition, I have reviewed and discussed with patient certain preventive protocols, quality metrics, and best practice recommendations. A written personalized care plan for preventive services as well as general preventive health recommendations  were provided to patient.     Sheral Flow, LPN   07/04/3660   Nurse Notes:  Hearing Screening - Comments:: Patient denied any hearing difficulty. Vision Screening - Comments:: Patient wears eyeglasses.  Cataract surgery scheduled for 10/26/2020 with Dr. Luberta Mutter.

## 2020-09-18 NOTE — Patient Instructions (Addendum)
[name] , Thank you for taking time to come for your Medicare Wellness Visit. I appreciate your ongoing commitment to your health goals. Please review the following plan we discussed and let me know if I can assist you in the future.   Screening recommendations/referrals: Colonoscopy: 08/27/2016; due every 5 years Recommended yearly ophthalmology/optometry visit for glaucoma screening and checkup Recommended yearly dental visit for hygiene and checkup  Vaccinations: Influenza vaccine: 11/03/2019; due Fall 2022 Pneumococcal vaccine: 08/09/2010, 08/19/2014 Tdap vaccine: 09/30/2011; due every 10 years Shingles vaccine: never done   Covid-19: 01/26/2019, 02/23/2019, 09/16/2019  Advanced directives: Please bring a copy of your health care power of attorney and living will to the office at your convenience.  Conditions/risks identified: Yes; Client understands the importance of follow-up with providers by attending scheduled visits and discussed goals to eat healthier, increase physical activity, exercise the brain, socialize more, get enough sleep and make time for laughter.  Next appointment: Please schedule your next Medicare Wellness Visit with your Nurse Health Advisor in 1 year by calling [phone].  Preventive Care 75 Years and Older, Male Preventive care refers to lifestyle choices and visits with your health care provider that can promote health and wellness. What does preventive care include? A yearly physical exam. This is also called an annual well check. Dental exams once or twice a year. Routine eye exams. Ask your health care provider how often you should have your eyes checked. Personal lifestyle choices, including: Daily care of your teeth and gums. Regular physical activity. Eating a healthy diet. Avoiding tobacco and drug use. Limiting alcohol use. Practicing safe sex. Taking low doses of aspirin every day. Taking vitamin and mineral supplements as recommended by your  health care provider. What happens during an annual well check? The services and screenings done by your health care provider during your annual well check will depend on your age, overall health, lifestyle risk factors, and family history of disease. Counseling  Your health care provider may ask you questions about your: Alcohol use. Tobacco use. Drug use. Emotional well-being. Home and relationship well-being. Sexual activity. Eating habits. History of falls. Memory and ability to understand (cognition). Work and work Statistician. Screening  You may have the following tests or measurements: Height, weight, and BMI. Blood pressure. Lipid and cholesterol levels. These may be checked every 5 years, or more frequently if you are over 69 years old. Skin check. Lung cancer screening. You may have this screening every year starting at age 23 if you have a 30-pack-year history of smoking and currently smoke or have quit within the past 15 years. Fecal occult blood test (FOBT) of the stool. You may have this test every year starting at age 42. Flexible sigmoidoscopy or colonoscopy. You may have a sigmoidoscopy every 5 years or a colonoscopy every 10 years starting at age 45. Prostate cancer screening. Recommendations will vary depending on your family history and other risks. Hepatitis C blood test. Hepatitis B blood test. Sexually transmitted disease (STD) testing. Diabetes screening. This is done by checking your blood sugar (glucose) after you have not eaten for a while (fasting). You may have this done every 1-3 years. Abdominal aortic aneurysm (AAA) screening. You may need this if you are a current or former smoker. Osteoporosis. You may be screened starting at age 28 if you are at high risk. Talk with your health care provider about your test results, treatment options, and if necessary, the need for more tests. Vaccines  Your health care  provider may recommend certain vaccines, such  as: Influenza vaccine. This is recommended every year. Tetanus, diphtheria, and acellular pertussis (Tdap, Td) vaccine. You may need a Td booster every 10 years. Zoster vaccine. You may need this after age 69. Pneumococcal 13-valent conjugate (PCV13) vaccine. One dose is recommended after age 89. Pneumococcal polysaccharide (PPSV23) vaccine. One dose is recommended after age 12. Talk to your health care provider about which screenings and vaccines you need and how often you need them. This information is not intended to replace advice given to you by your health care provider. Make sure you discuss any questions you have with your health care provider. Document Released: 01/20/2015 Document Revised: 09/13/2015 Document Reviewed: 10/25/2014 Elsevier Interactive Patient Education  2017 [place] Prevention in the Home Falls can cause injuries. They can happen to people of all ages. There are many things you can do to make your home safe and to help prevent falls. What can I do on the outside of my home? Regularly fix the edges of walkways and driveways and fix any cracks. Remove anything that might make you trip as you walk through a door, such as a raised step or threshold. Trim any bushes or trees on the path to your home. Use bright outdoor lighting. Clear any walking paths of anything that might make someone trip, such as rocks or tools. Regularly check to see if handrails are loose or broken. Make sure that both sides of any steps have handrails. Any raised decks and porches should have guardrails on the edges. Have any leaves, snow, or ice cleared regularly. Use sand or salt on walking paths during winter. Clean up any spills in your garage right away. This includes oil or grease spills. What can I do in the bathroom? Use night lights. Install grab bars by the toilet and in the tub and shower. Do not use towel bars as grab bars. Use non-skid mats or decals in the tub or  shower. If you need to sit down in the shower, use a plastic, non-slip stool. Keep the floor dry. Clean up any water that spills on the floor as soon as it happens. Remove soap buildup in the tub or shower regularly. Attach bath mats securely with double-sided non-slip rug tape. Do not have throw rugs and other things on the floor that can make you trip. What can I do in the bedroom? Use night lights. Make sure that you have a light by your bed that is easy to reach. Do not use any sheets or blankets that are too big for your bed. They should not hang down onto the floor. Have a firm chair that has side arms. You can use this for support while you get dressed. Do not have throw rugs and other things on the floor that can make you trip. What can I do in the kitchen? Clean up any spills right away. Avoid walking on wet floors. Keep items that you use a lot in easy-to-reach places. If you need to reach something above you, use a strong step stool that has a grab bar. Keep electrical cords out of the way. Do not use floor polish or wax that makes floors slippery. If you must use wax, use non-skid floor wax. Do not have throw rugs and other things on the floor that can make you trip. What can I do with my stairs? Do not leave any items on the stairs. Make sure that there are handrails on both  sides of the stairs and use them. Fix handrails that are broken or loose. Make sure that handrails are as long as the stairways. Check any carpeting to make sure that it is firmly attached to the stairs. Fix any carpet that is loose or worn. Avoid having throw rugs at the top or bottom of the stairs. If you do have throw rugs, attach them to the floor with carpet tape. Make sure that you have a light switch at the top of the stairs and the bottom of the stairs. If you do not have them, ask someone to add them for you. What else can I do to help prevent falls? Wear shoes that: Do not have high heels. Have  rubber bottoms. Are comfortable and fit you well. Are closed at the toe. Do not wear sandals. If you use a stepladder: Make sure that it is fully opened. Do not climb a closed stepladder. Make sure that both sides of the stepladder are locked into place. Ask someone to hold it for you, if possible. Clearly mark and make sure that you can see: Any grab bars or handrails. First and last steps. Where the edge of each step is. Use tools that help you move around (mobility aids) if they are needed. These include: Canes. Walkers. Scooters. Crutches. Turn on the lights when you go into a dark area. Replace any light bulbs as soon as they burn out. Set up your furniture so you have a clear path. Avoid moving your furniture around. If any of your floors are uneven, fix them. If there are any pets around you, be aware of where they are. Review your medicines with your doctor. Some medicines can make you feel dizzy. This can increase your chance of falling. Ask your doctor what other things that you can do to help prevent falls. This information is not intended to replace advice given to you by your health care provider. Make sure you discuss any questions you have with your health care provider. Document Released: 10/20/2008 Document Revised: 06/01/2015 Document Reviewed: 01/28/2014 Elsevier Interactive Patient Education  2017 [employer].

## 2020-09-19 ENCOUNTER — Telehealth: Payer: Self-pay | Admitting: Pharmacist

## 2020-09-19 NOTE — Progress Notes (Signed)
Chronic Care Management Pharmacy Assistant   Name: Patrick Harris  MRN: [id number] DOB: 1945-07-11   Reason for Encounter: Disease State   Conditions to be addressed/monitored: HTN   Recent office visits:  08/29/20 [name], MD (PCP) Annual Exam; med changes:zpak for your sinus infection  Recent consult visits:  None ID  Hospital visits:  None in previous 6 months  Medications: Outpatient Encounter Medications as of 09/19/2020  Medication Sig   acetaminophen (TYLENOL) 500 MG tablet Take 500 mg by mouth every 6 (six) hours as needed.   aspirin EC 81 MG tablet Take 1 tablet (81 mg total) by mouth at bedtime.   azithromycin (ZITHROMAX) 250 MG tablet Take two tabs the first day and then one tab daily for four days   bromocriptine (PARLODEL) 5 MG capsule Take 15 mg by mouth daily.   Cholecalciferol (VITAMIN D3) 2000 UNITS TABS Take by mouth. 2000 iu in the am, 1000 iu in the pm   Coenzyme Q10 (COQ-10) 100 MG CAPS Take by mouth.   Garlic 6270 MG CAPS Take by mouth.   hydrALAZINE (APRESOLINE) 25 MG tablet Take 1 tablet (25 mg total) by mouth 3 (three) times daily.   Iodine, Kelp, (KELP PO) Take 225 mcg by mouth.   Magnesium 250 MG TABS Take by mouth.   Menaquinone-7 (VITAMIN K2) 100 MCG CAPS Take by mouth.   nitroGLYCERIN (NITROSTAT) 0.4 MG SL tablet Place 1 tablet (0.4 mg total) under the tongue every 5 (five) minutes as needed for chest pain.   Omega-3 Fatty Acids (OMEGA-3 FISH OIL PO) Take 900 mg by mouth.   omeprazole (PRILOSEC) 20 MG capsule Take 1 capsule (20 mg total) by mouth daily.   rosuvastatin (CRESTOR) 20 MG tablet TAKE HALF A TABLET (10 MG) BY MOUTH ONCE DAILY.   tamsulosin (FLOMAX) 0.4 MG CAPS capsule Take 0.4 mg by mouth 2 (two) times daily.   Testosterone 20.25 MG/ACT (1.62%) GEL SMARTSIG:2 Pump Topical Daily   Turmeric (QC TUMERIC COMPLEX PO) Take 1,000 mg by mouth.   valsartan (DIOVAN) 160 MG tablet Take 1 tablet (160 mg total) by mouth daily.    vitamin C (ASCORBIC ACID) 500 MG tablet Take 500 mg by mouth 2 (two) times daily.   No facility-administered encounter medications on file as of 09/19/2020.    Recent Office Vitals: BP Readings from Last 3 Encounters:  09/18/20 130/78  08/29/20 138/80  03/27/20 140/78   Pulse Readings from Last 3 Encounters:  09/18/20 (!) 59  08/29/20 (!) 53  03/27/20 67    Wt Readings from Last 3 Encounters:  09/18/20 182 lb 9.6 oz (82.8 kg)  08/29/20 182 lb (82.6 kg)  03/27/20 191 lb (86.6 kg)     Kidney Function Lab Results  Component Value Date/Time   CREATININE 0.99 08/29/2020 08:58 AM   CREATININE 0.97 03/27/2020 11:15 AM   CREATININE 1.07 08/25/2019 08:47 AM   GFR 74.72 08/29/2020 08:58 AM   GFRNONAA >60 11/15/2016 02:10 PM   GFRAA >60 11/15/2016 02:10 PM    BMP Latest Ref Rng & Units 08/29/2020 03/27/2020 08/25/2019  Glucose 70 - 99 mg/dL 94 104(H) 97  BUN 6 - 23 mg/dL 14 18 13   Creatinine 0.40 - 1.50 mg/dL 0.99 0.97 1.07  BUN/Creat Ratio 6 - 22 (calc) - - NOT APPLICABLE  Sodium 350 - 145 mEq/L 137 137 138  Potassium 3.5 - 5.1 mEq/L 4.4 4.3 4.6  Chloride 96 - 112 mEq/L 103 103 103  CO2  19 - 32 mEq/L 27 27 30   Calcium 8.4 - 10.5 mg/dL 9.4 9.8 9.3     Contacted patient on 09/19/20 to discuss hypertension disease state  Current antihypertensive regimen:  Valsartan 160 mg daily Hydralazine 25 mg TID  Patient verbally confirms he is taking the above medications as directed. Yes  How often are you checking your Blood Pressure? daily  he checks his blood pressure in the morning before taking his medication.  Current home BP readings: 138/65  Any readings above 180/120? No If yes any symptoms of hypertensive emergency? patient denies any symptoms of high blood pressure   What recent interventions/DTPs have been made by any provider to improve Blood Pressure control since last CPP Visit: None noted  Any recent hospitalizations or ED visits since last visit with CPP?  No  What diet changes have been made to improve Blood Pressure Control?  Patient has not made any changes in diet  What exercise is being done to improve your Blood Pressure Control?  Patient states that he walks and does other exercises at least 5 times a week  Adherence Review: Is the patient currently on ACE/ARB medication? Yes Does the patient have >5 day gap between last estimated fill dates? No   Star Rating Drugs:  Medication:  Last Fill: Day Supply Valsartan 160 mg 08/29/20 90   Care Gaps: Annual wellness visit in last year? Yes Most Recent BP reading:130/78 09/18/20   No appointments scheduled within the next 30 days.   [place] Pharmacist Assistant [phone]   Time spent:

## 2020-09-22 ENCOUNTER — Other Ambulatory Visit: Payer: Self-pay

## 2020-09-22 DIAGNOSIS — I739 Peripheral vascular disease, unspecified: Secondary | ICD-10-CM

## 2020-09-28 ENCOUNTER — Ambulatory Visit: Payer: Medicare Other | Admitting: Internal Medicine

## 2020-10-02 NOTE — Progress Notes (Signed)
[facility] [place]  ASSESSMENT / PLAN: Patrick Harris is a 75 y.o. male with atherosclerosis of native arteries of bilateral lower extremities. I do not think arterial disease is causing his symptoms.  Recommend the following which can slow the progression of atherosclerosis and reduce the risk of major adverse cardiac / limb events:  Complete cessation from all tobacco products. Blood glucose control with goal A1c < 7%. Blood pressure control with goal blood pressure < 140/90 mmHg. Lipid reduction therapy with goal LDL-C <100 mg/dL (<70 if symptomatic from PAD).  Aspirin 81mg  PO QD.  Atorvastatin 40-80mg  PO QD (or other "high intensity" statin therapy).  Agree with nerve conduction study. Follow up with [facility] in 1 year with repeat ABI for surveillance of PAD.  CHIEF COMPLAINT: Bilateral lower extremity pain, left greater than right  HISTORY OF PRESENT ILLNESS: Patrick Harris is a 75 y.o. male referred to clinic for evaluation of lower extremity pain he reports he is discomfort in his thighs which extends into his legs.  He describes a paresthesia like constellation of symptoms which occur from time to time with his pain.  He does not report classic symptoms of intermittent claudication he does not have achy pain in his calves brought on by exercise and relieved by rest.  He does not report symptoms typical of ischemic rest pain.  He has no ulcers about his feet.  Past Medical History:  Diagnosis Date   Arthritis    CAD (coronary artery disease)    CABG with LIMA to LAD and RIMA to RCA August 2000 - Dr. Roxan Hockey   [facility])    basal cell   CHF (congestive heart failure) (HCC)    COPD (chronic obstructive pulmonary disease) (HCC)    Elevated glycated hemoglobin    Essential hypertension    GERD (gastroesophageal reflux disease)    Heart murmur    Hyperlipidemia    Hypopituitarism (HCC)    Osteopenia    Dr Wilson Singer   Pancreatitis    Peyronie disease     Testosterone deficiency    Dr Wilson Singer    Past Surgical History:  Procedure Laterality Date   Bone spur  2006   5th toe bilaterally   COLONOSCOPY  2007   negative; Dr Sharlett Iles   CORONARY ARTERY BYPASS GRAFT  2001   2 vessels   fracture heel  1967   Bil/ due to fall off ladder   Fractured calcaneus  1969   bilaterally w/ ankle fusion   [place]   right inguinal hernia   KNEE SURGERY Left    MENISECTOMY  2007   R medial, Dr. Lorin Mercy   SHOULDER SURGERY  1994   left   UPPER GASTROINTESTINAL ENDOSCOPY  2007   GERD, Dr Sharlett Iles   VASECTOMY      Family History  Problem Relation Age of Onset   Heart attack Father 48   Diabetes Mother    Stroke Mother 13   Hypertension Mother    Leukemia Sister    Colon cancer Paternal Uncle    Aortic aneurysm Brother        abdominal   Heart disease Brother    Atrial fibrillation Brother    Cancer Maternal Uncle         X3:prostate , renal, bone    Parkinson's disease Sister    Stroke Sister    Stroke Sister    Stroke Sister    Atrial fibrillation Sister  Esophageal cancer Neg Hx    Rectal cancer Neg Hx    Stomach cancer Neg Hx     Social History   Socioeconomic History   Marital status: Married    Spouse name: Not on file   Number of children: 1   Years of education: Not on file   Highest education level: Not on file  Occupational History   Occupation: retired  Tobacco Use   Smoking status: Former    Types: Cigarettes    Quit date: 01/07/1998    Years since quitting: 22.7   Smokeless tobacco: Never   Tobacco comments:    smoked 1963- 2000, up to 1 ppd  Vaping Use   Vaping Use: Never used  Substance and Sexual Activity   Alcohol use: No   Drug use: No   Sexual activity: Yes  Other Topics Concern   Not on file  Social History Narrative   Not on file   Social Determinants of Health   Financial Resource Strain: Low Risk    Difficulty of Paying Living Expenses: Not hard at all  Food Insecurity: No  Food Insecurity   Worried About Charity fundraiser in the Last Year: Never true   [place] in the Last Year: Never true  Transportation Needs: No Transportation Needs   Lack of Transportation (Medical): No   Lack of Transportation (Non-Medical): No  Physical Activity: Sufficiently Active   Days of Exercise per Week: 5 days   Minutes of Exercise per Session: 30 min  Stress: No Stress Concern Present   Feeling of Stress : Not at all  Social Connections: Socially Integrated   Frequency of Communication with Friends and Family: More than three times a week   Frequency of Social Gatherings with Friends and Family: More than three times a week   Attends Religious Services: More than 4 times per year   Active Member of [employer] or Organizations: Yes   Attends Music therapist: More than 4 times per year   Marital Status: Married  Human resources officer Violence: Not At Risk   Fear of Current or Ex-Partner: No   Emotionally Abused: No   Physically Abused: No   Sexually Abused: No    Allergies  Allergen Reactions   Latex     Contact  dermatitis   Lisinopril     Leg pain, inc HR, dizziness, headache   Amlodipine     Edema    Hydrochlorothiazide Other (See Comments)    Possibly caused pancreatitis 2018   Simvastatin     Joint pain    Current Outpatient Medications  Medication Sig Dispense Refill   acetaminophen (TYLENOL) 500 MG tablet Take 500 mg by mouth every 6 (six) hours as needed.     aspirin EC 81 MG tablet Take 1 tablet (81 mg total) by mouth at bedtime.     azithromycin (ZITHROMAX) 250 MG tablet Take two tabs the first day and then one tab daily for four days 6 tablet 0   bromocriptine (PARLODEL) 5 MG capsule Take 15 mg by mouth daily.     Cholecalciferol (VITAMIN D3) 2000 UNITS TABS Take by mouth. 2000 iu in the am, 1000 iu in the pm     Coenzyme Q10 (COQ-10) 100 MG CAPS Take by mouth.     Garlic 1610 MG CAPS Take by mouth.     hydrALAZINE (APRESOLINE) 25 MG  tablet Take 1 tablet (25 mg total) by mouth 3 (three) times daily. Ward  tablet 3   Iodine, Kelp, (KELP PO) Take 225 mcg by mouth.     Magnesium 250 MG TABS Take by mouth.     Menaquinone-7 (VITAMIN K2) 100 MCG CAPS Take by mouth.     nitroGLYCERIN (NITROSTAT) 0.4 MG SL tablet Place 1 tablet (0.4 mg total) under the tongue every 5 (five) minutes as needed for chest pain. 25 tablet 3   Omega-3 Fatty Acids (OMEGA-3 FISH OIL PO) Take 900 mg by mouth.     omeprazole (PRILOSEC) 20 MG capsule Take 1 capsule (20 mg total) by mouth daily. 90 capsule 3   rosuvastatin (CRESTOR) 20 MG tablet TAKE HALF A TABLET (10 MG) BY MOUTH ONCE DAILY. 45 tablet 3   tamsulosin (FLOMAX) 0.4 MG CAPS capsule Take 0.4 mg by mouth 2 (two) times daily.     Testosterone 20.25 MG/ACT (1.62%) GEL SMARTSIG:2 Pump Topical Daily     Turmeric (QC TUMERIC COMPLEX PO) Take 1,000 mg by mouth.     valsartan (DIOVAN) 160 MG tablet Take 1 tablet (160 mg total) by mouth daily. 90 tablet 3   vitamin C (ASCORBIC ACID) 500 MG tablet Take 500 mg by mouth 2 (two) times daily.     No current facility-administered medications for this visit.    REVIEW OF SYSTEMS:  [X]  denotes positive finding, [ ]  denotes negative finding Cardiac  Comments:  Chest pain or chest pressure:    Shortness of breath upon exertion:    Short of breath when lying flat:    Irregular heart rhythm:        Vascular    Pain in calf, thigh, or hip brought on by ambulation:    Pain in feet at night that wakes you up from your sleep:     Blood clot in your veins:    Leg swelling:         Pulmonary    Oxygen at home:    Productive cough:     Wheezing:         Neurologic    Sudden weakness in arms or legs:     Sudden numbness in arms or legs:     Sudden onset of difficulty speaking or slurred speech:    Temporary loss of vision in one eye:     Problems with dizziness:         Gastrointestinal    Blood in stool:     Vomited blood:         Genitourinary     Burning when urinating:     Blood in urine:        Psychiatric    Major depression:         Hematologic    Bleeding problems:    Problems with blood clotting too easily:        Skin    Rashes or ulcers:        Constitutional    Fever or chills:      PHYSICAL EXAM Vitals:   10/03/20 1532  BP: (!) 148/76  Pulse: (!) 56  Resp: 20  Temp: 98.6 F (37 C)  SpO2: 94%  Weight: 181 lb (82.1 kg)  Height: 6' (1.829 m)    Constitutional: well appearing. no distress. Appears well nourished.  Neurologic: CN intact. no focal findings. no sensory loss. Psychiatric:  Mood and affect symmetric and appropriate. Eyes:  No icterus. No conjunctival pallor. Ears, nose, throat:  mucous membranes moist. Midline trachea.  Cardiac: regular rate and rhythm.  Respiratory:  unlabored. Abdominal:  soft, non-tender, non-distended.  Peripheral vascular: 2+ radial pulses  1+ DP pulses Extremity: no edema. no cyanosis. no pallor.  Skin: no gangrene. no ulceration.  Lymphatic: no Stemmer's sign. no palpable lymphadenopathy.  PERTINENT LABORATORY AND RADIOLOGIC DATA  Most recent CBC CBC Latest Ref Rng & Units 08/29/2020 08/25/2019 03/18/2019  WBC 4.0 - 10.5 K/uL 4.6 5.5 6.4  Hemoglobin 13.0 - 17.0 g/dL 17.2(H) 16.5 15.7  Hematocrit 39.0 - 52.0 % 51.6 50.9(H) 45.9  Platelets 150.0 - 400.0 K/uL 158.0 190 180.0     Most recent CMP CMP Latest Ref Rng & Units 08/29/2020 03/27/2020 08/25/2019  Glucose 70 - 99 mg/dL 94 104(H) 97  BUN 6 - 23 mg/dL 14 18 13   Creatinine 0.40 - 1.50 mg/dL 0.99 0.97 1.07  Sodium 135 - 145 mEq/L 137 137 138  Potassium 3.5 - 5.1 mEq/L 4.4 4.3 4.6  Chloride 96 - 112 mEq/L 103 103 103  CO2 19 - 32 mEq/L 27 27 30   Calcium 8.4 - 10.5 mg/dL 9.4 9.8 9.3  Total Protein 6.0 - 8.3 g/dL 7.3 7.2 7.1  Total Bilirubin 0.2 - 1.2 mg/dL 0.7 0.5 0.7  Alkaline Phos 39 - 117 U/L 47 57 -  AST 0 - 37 U/L 19 17 18   ALT 0 - 53 U/L 16 17 12     Renal function CrCl cannot be calculated  (Patient's most recent lab result is older than the maximum 21 days allowed.).  Hemoglobin A1C (no units)  Date Value  12/23/2017 5.9   Hgb A1c MFr Bld (%)  Date Value  08/29/2020 6.0    LDL Cholesterol (Calc)  Date Value Ref Range Status  08/25/2019 71 mg/dL (calc) Final    Comment:    Reference range: <100 . Desirable range <100 mg/dL for primary prevention;   <70 mg/dL for patients with CHD or diabetic patients  with > or = 2 CHD risk factors. Marland Kitchen LDL-C is now calculated using the Martin-Hopkins  calculation, which is a validated novel method providing  better accuracy than the Friedewald equation in the  estimation of LDL-C.  Cresenciano Genre et al. Annamaria Helling. 5638;937(34): 2061-2068  (http://education.[facility].com/faq/FAQ164)    LDL Cholesterol  Date Value Ref Range Status  08/29/2020 65 0 - 99 mg/dL Final    +-------+-----------+-----------+------------+------------+  ABI/TBIToday's ABIToday's TBIPrevious ABIPrevious TBI  +-------+-----------+-----------+------------+------------+  Right  0.68       0.53                                 +-------+-----------+-----------+------------+------------+  Left   0.82       0.62                                 +-------+-----------+-----------+------------+------------+     Yevonne Aline. Stanford Breed, MD [facility] Phone Number: [phone] 10/03/2020 4:50 PM  Total time spent on preparing this encounter including chart review, data review, collecting history, examining the patient, coordinating care for this new patient, 45 minutes.  Portions of this report may have been transcribed using voice recognition software.  Every effort has been made to ensure accuracy; however, inadvertent computerized transcription errors may still be present.

## 2020-10-03 ENCOUNTER — Other Ambulatory Visit: Payer: Self-pay

## 2020-10-03 ENCOUNTER — Ambulatory Visit: Payer: Medicare Other | Admitting: Vascular Surgery

## 2020-10-03 ENCOUNTER — Encounter: Payer: Self-pay | Admitting: Vascular Surgery

## 2020-10-03 ENCOUNTER — Ambulatory Visit (HOSPITAL_COMMUNITY)
Admission: RE | Admit: 2020-10-03 | Discharge: 2020-10-03 | Disposition: A | Discharge status: Home / Self Care | Payer: Medicare Other | Source: Ambulatory Visit | Attending: Vascular Surgery | Admitting: Vascular Surgery

## 2020-10-03 VITALS — BP 148/76 | HR 56 | Temp 98.6°F | Resp 20 | Ht 72.0 in | Wt 181.0 lb

## 2020-10-03 DIAGNOSIS — I739 Peripheral vascular disease, unspecified: Secondary | ICD-10-CM

## 2020-10-25 ENCOUNTER — Encounter: Payer: Self-pay | Admitting: Neurology

## 2020-10-25 ENCOUNTER — Other Ambulatory Visit: Payer: Self-pay

## 2020-10-25 ENCOUNTER — Ambulatory Visit: Payer: Medicare Other | Admitting: Neurology

## 2020-10-25 VITALS — BP 156/79 | HR 88 | Ht 72.0 in | Wt 181.0 lb

## 2020-10-25 DIAGNOSIS — R202 Paresthesia of skin: Secondary | ICD-10-CM

## 2020-10-25 MED ORDER — GABAPENTIN 300 MG PO CAPS: ORAL_CAPSULE | ORAL | 5 refills | Status: DC

## 2020-10-25 NOTE — Progress Notes (Signed)
[facility] Clinic Note - Initial Visit   Date: 10/25/20  VUONG MUSA MRN: [id number] DOB: April 26, 1945   Dear Dr. Quay Burow:  Thank you for your kind referral of Patrick Harris for consultation of left leg pain. Although his history is well known to you, please allow [place] to reiterate it for the purpose of our medical record. The patient was accompanied to the clinic by self.    History of Present Illness: Patrick Harris is a 75 y.o. right-handed male with hypertension, hyperlipidemia, GERD, and pituitary adenoma presenting for evaluation of left leg pain.  Starting around 3-4 years, he began having cramp involving the medial left leg, which resolved with gabapentin.  He also has burning sensation over the medial knee, medial lower leg, and into the great toe.  This pain is constant.  There is not thing that provides any relief or exacerbates his pain.  He does not have similar symptoms in the right foot. He has seen Vascular surgery whose evaluated showed mild PAD, but did not feel that his symptoms were related to this.  He has also seen Dr. Ellene Route at [facility] who performed MRI cervical spine and lumbar spine. I will request these reports.  His MRI lumbar spine did not seems to show any nerve impingement to explain his symptoms. He is here for further evaluation.   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  His daughter passed away in March 26, 2018 from breast cancer.   Out-side paper records, electronic medical record, and images have been reviewed where available and summarized as:  Lab Results  Component Value Date   HGBA1C 6.0 08/29/2020   Lab Results  Component Value Date   [id number] 829 08/25/2019   Lab Results  Component Value Date   TSH 1.63 08/29/2020   No results found for: ESRSEDRATE, POCTSEDRATE  Past Medical History:  Diagnosis Date   Arthritis    CAD (coronary artery disease)    CABG with LIMA to LAD and RIMA to RCA August  2000 - Dr. Roxan Hockey   Cancer [facility])    basal cell   CHF (congestive heart failure) (HCC)    COPD (chronic obstructive pulmonary disease) ([place])    Elevated glycated hemoglobin    Essential hypertension    GERD (gastroesophageal reflux disease)    Heart murmur    Hyperlipidemia    Hypopituitarism (HCC)    Osteopenia    Dr Wilson Singer   Pancreatitis    Peyronie disease    Testosterone deficiency    Dr Wilson Singer    Past Surgical History:  Procedure Laterality Date   Bone spur  March 25, 2004   5th toe bilaterally   COLONOSCOPY  03-25-05   negative; Dr Sharlett Iles   CORONARY ARTERY BYPASS GRAFT  March 26, 1999   2 vessels   fracture heel  1965/03/25   Bil/ due to fall off ladder   Fractured calcaneus  1969   bilaterally w/ ankle fusion   [place]   right inguinal hernia   KNEE SURGERY Left    MENISECTOMY  03-25-2005   R medial, Dr. Lorin Mercy   SHOULDER SURGERY  1994   left   UPPER GASTROINTESTINAL ENDOSCOPY  March 25, 2005   GERD, Dr Sharlett Iles   VASECTOMY       Medications:  Outpatient Encounter Medications as of 10/25/2020  Medication Sig   acetaminophen (TYLENOL) 500 MG tablet Take 500 mg by mouth every 6 (six) hours as needed.   aspirin EC 81 MG tablet  Take 1 tablet (81 mg total) by mouth at bedtime.   bromocriptine (PARLODEL) 5 MG capsule Take 15 mg by mouth daily.   Cholecalciferol (VITAMIN D3) 2000 UNITS TABS Take by mouth. 2000 iu in the am, 1000 iu in the pm   Coenzyme Q10 (COQ-10) 100 MG CAPS Take by mouth.   Garlic 6812 MG CAPS Take by mouth.   hydrALAZINE (APRESOLINE) 25 MG tablet Take 1 tablet (25 mg total) by mouth 3 (three) times daily.   Iodine, Kelp, (KELP PO) Take 225 mcg by mouth.   Magnesium 250 MG TABS Take by mouth.   Menaquinone-7 (VITAMIN K2) 100 MCG CAPS Take by mouth.   nitroGLYCERIN (NITROSTAT) 0.4 MG SL tablet Place 1 tablet (0.4 mg total) under the tongue every 5 (five) minutes as needed for chest pain.   Omega-3 Fatty Acids (OMEGA-3 FISH OIL PO) Take 900 mg by mouth.   omeprazole  (PRILOSEC) 20 MG capsule Take 1 capsule (20 mg total) by mouth daily.   rosuvastatin (CRESTOR) 20 MG tablet TAKE HALF A TABLET (10 MG) BY MOUTH ONCE DAILY.   tamsulosin (FLOMAX) 0.4 MG CAPS capsule Take 0.4 mg by mouth 2 (two) times daily.   Testosterone 20.25 MG/ACT (1.62%) GEL SMARTSIG:2 Pump Topical Daily   Turmeric (QC TUMERIC COMPLEX PO) Take 1,000 mg by mouth.   valsartan (DIOVAN) 160 MG tablet Take 1 tablet (160 mg total) by mouth daily.   vitamin C (ASCORBIC ACID) 500 MG tablet Take 500 mg by mouth 2 (two) times daily.   [DISCONTINUED] azithromycin (ZITHROMAX) 250 MG tablet Take two tabs the first day and then one tab daily for four days (Patient not taking: Reported on 10/25/2020)   No facility-administered encounter medications on file as of 10/25/2020.    Allergies:  Allergies  Allergen Reactions   Latex     Contact  dermatitis   Lisinopril     Leg pain, inc HR, dizziness, headache   Amlodipine     Edema    Hydrochlorothiazide Other (See Comments)    Possibly caused pancreatitis 2018   Simvastatin     Joint pain    Family History: Family History  Problem Relation Age of Onset   Heart attack Father 34   Diabetes Mother    Stroke Mother 59   Hypertension Mother    Leukemia Sister    Colon cancer Paternal Uncle    Aortic aneurysm Brother        abdominal   Heart disease Brother    Atrial fibrillation Brother    Cancer Maternal Uncle         X3:prostate , renal, bone    Parkinson's disease Sister    Stroke Sister    Stroke Sister    Stroke Sister    Atrial fibrillation Sister    Esophageal cancer Neg Hx    Rectal cancer Neg Hx    Stomach cancer Neg Hx     Social History: Social History   Tobacco Use   Smoking status: Former    Types: Cigarettes    Quit date: 01/07/1998    Years since quitting: 22.8   Smokeless tobacco: Never   Tobacco comments:    smoked 1963- 2000, up to 1 ppd  Vaping Use   Vaping Use: Never used  Substance Use Topics   Alcohol  use: No   Drug use: No   Social History   Social History Narrative   Patients daughter passed away at 46 from Breast Cancer.  Right Handed    Lives in two story home     Vital Signs:  BP (!) 156/79   Pulse 88   Ht 6' (1.829 m)   Wt 181 lb (82.1 kg)   SpO2 95%   BMI 24.55 kg/m   Neurological Exam: MENTAL STATUS including orientation to time, place, person, recent and remote memory, attention span and concentration, language, and fund of knowledge is normal.  Speech is not dysarthric.  CRANIAL NERVES: II:  No visual field defects.   III-IV-VI: Pupils equal round and reactive to light.  Normal conjugate, extra-ocular eye movements in all directions of gaze.  No nystagmus.  No ptosis.   V:  Normal facial sensation.    VII:  Normal facial symmetry and movements.   VIII:  Normal hearing and vestibular function.   IX-X:  Normal palatal movement.   XI:  Normal shoulder shrug and head rotation.   XII:  Normal tongue strength and range of motion, no deviation or fasciculation.  MOTOR:  No atrophy, fasciculations or abnormal movements.  No pronator drift.   Upper Extremity:  Right  Left  Deltoid  5/5   5/5   Biceps  5/5   5/5   Triceps  5/5   5/5   Infraspinatus 5/5  5/5  Medial pectoralis 5/5  5/5  Wrist extensors  5/5   5/5   Wrist flexors  5/5   5/5   Finger extensors  5/5   5/5   Finger flexors  5/5   5/5   Dorsal interossei  5/5   5/5   Abductor pollicis  5/5   5/5   Tone (Ashworth scale)  0  0   Lower Extremity:  Right  Left  Hip flexors  5/5   5/5   Hip extensors  5/5   5/5   Adductor 5/5  5/5  Abductor 5/5  5/5  Knee flexors  5/5   5/5   Knee extensors  5/5   5/5   Dorsiflexors  5/5   5/5   Plantarflexors  5/5   5/5   Toe extensors  5/5   5/5   Toe flexors  5/5   5/5   Tone (Ashworth scale)  0  0   MSRs:  Right        Left                  brachioradialis 2+  2+  biceps 2+  2+  triceps 2+  2+  patellar 2+  2+  ankle jerk 2+  2+  Hoffman no  no   plantar response down  down   SENSORY:  Normal and symmetric perception of light touch, pinprick, vibration, and proprioception.    COORDINATION/GAIT: Normal finger-to- nose-finger.  Intact rapid alternating movements bilaterally.  Gait narrow based and stable. Tandem and stressed gait intact.    IMPRESSION: Left medial knee, lower leg, and foot paresthesias, chronic.  The distribution of his symptoms is suggestive of lumbar radiculopathy, however imaging does not show any impingement at the L4 level.  Neurological exam is intact and does not suggest  neuropathy.  Symptoms are too widespread for saphenous neuropathy.  I offered NCS/EMG of the left leg to further characterize his symptoms, which he opted out of and prefers to manage symptomatically, since the outcome of the EMG may not change management.  I recommend that he start gabapentin 300mg  at bedtime x 1 week, then increase to 300mg  BID, ok to titrate  as needed.  I will request MRI cervical spine and lumbar spine reports for our records.   Return to clinic in 3 months.   Thank you for allowing me to participate in patient's care.  If I can answer any additional questions, I would be pleased to do so.    Sincerely,    Roselina Burgueno K. Posey Pronto, DO

## 2020-10-25 NOTE — Patient Instructions (Addendum)
Please call the office if you would like to proceed with nerve testing of the left leg.    Start gabapentin 300mg  at bedtime x 1 week, then increase to 1 tablet twice daily.  Return to clinic in 3 months  [place] (EMG/NCS) INSTRUCTIONS  How to Prepare The neurologist conducting the EMG will need to know if you have certain medical conditions. Tell the neurologist and other EMG lab personnel if you: Have a pacemaker or any other electrical medical device Take blood-thinning medications Have hemophilia, a blood-clotting disorder that causes prolonged bleeding Bathing Take a shower or bath shortly before your exam in order to remove oils from your skin. Don't apply lotions or creams before the exam.  What to Expect You'll likely be asked to change into a hospital gown for the procedure and lie down on an examination table. The following explanations can help you understand what will happen during the exam.  Electrodes. The neurologist or a technician places surface electrodes at various locations on your skin depending on where you're experiencing symptoms. Or the neurologist may insert needle electrodes at different sites depending on your symptoms.  Sensations. The electrodes will at times transmit a tiny electrical current that you may feel as a twinge or spasm. The needle electrode may cause discomfort or pain that usually ends shortly after the needle is removed. If you are concerned about discomfort or pain, you may want to talk to the neurologist about taking a short break during the exam.  Instructions. During the needle EMG, the neurologist will assess whether there is any spontaneous electrical activity when the muscle is at rest - activity that isn't present in healthy muscle tissue - and the degree of activity when you slightly contract the muscle.  He or she will give you instructions on resting and contracting a muscle at appropriate times.  Depending on what muscles and nerves the neurologist is examining, he or she may ask you to change positions during the exam.  After your EMG You may experience some temporary, minor bruising where the needle electrode was inserted into your muscle. This bruising should fade within several days. If it persists, contact your primary care doctor.

## 2020-10-26 DIAGNOSIS — H21561 Pupillary abnormality, right eye: Secondary | ICD-10-CM | POA: Diagnosis not present

## 2020-10-26 DIAGNOSIS — H2511 Age-related nuclear cataract, right eye: Secondary | ICD-10-CM | POA: Diagnosis not present

## 2020-10-26 DIAGNOSIS — H2181 Floppy iris syndrome: Secondary | ICD-10-CM | POA: Diagnosis not present

## 2020-11-09 DIAGNOSIS — C44629 Squamous cell carcinoma of skin of left upper limb, including shoulder: Secondary | ICD-10-CM | POA: Diagnosis not present

## 2020-11-09 DIAGNOSIS — L821 Other seborrheic keratosis: Secondary | ICD-10-CM | POA: Diagnosis not present

## 2020-11-09 DIAGNOSIS — L280 Lichen simplex chronicus: Secondary | ICD-10-CM | POA: Diagnosis not present

## 2020-11-09 DIAGNOSIS — D485 Neoplasm of uncertain behavior of skin: Secondary | ICD-10-CM | POA: Diagnosis not present

## 2020-11-09 DIAGNOSIS — L578 Other skin changes due to chronic exposure to nonionizing radiation: Secondary | ICD-10-CM | POA: Diagnosis not present

## 2020-11-09 DIAGNOSIS — D225 Melanocytic nevi of trunk: Secondary | ICD-10-CM | POA: Diagnosis not present

## 2020-11-16 DIAGNOSIS — H21562 Pupillary abnormality, left eye: Secondary | ICD-10-CM | POA: Diagnosis not present

## 2020-11-16 DIAGNOSIS — H2512 Age-related nuclear cataract, left eye: Secondary | ICD-10-CM | POA: Diagnosis not present

## 2020-12-11 DIAGNOSIS — E230 Hypopituitarism: Secondary | ICD-10-CM | POA: Diagnosis not present

## 2020-12-11 DIAGNOSIS — E221 Hyperprolactinemia: Secondary | ICD-10-CM | POA: Diagnosis not present

## 2020-12-11 DIAGNOSIS — D352 Benign neoplasm of pituitary gland: Secondary | ICD-10-CM | POA: Diagnosis not present

## 2020-12-18 DIAGNOSIS — E221 Hyperprolactinemia: Secondary | ICD-10-CM | POA: Diagnosis not present

## 2020-12-18 DIAGNOSIS — D352 Benign neoplasm of pituitary gland: Secondary | ICD-10-CM | POA: Diagnosis not present

## 2020-12-18 DIAGNOSIS — E230 Hypopituitarism: Secondary | ICD-10-CM | POA: Diagnosis not present

## 2021-01-09 DIAGNOSIS — L570 Actinic keratosis: Secondary | ICD-10-CM | POA: Diagnosis not present

## 2021-01-09 DIAGNOSIS — D0462 Carcinoma in situ of skin of left upper limb, including shoulder: Secondary | ICD-10-CM | POA: Diagnosis not present

## 2021-01-09 DIAGNOSIS — C44629 Squamous cell carcinoma of skin of left upper limb, including shoulder: Secondary | ICD-10-CM | POA: Diagnosis not present

## 2021-01-24 DIAGNOSIS — D0439 Carcinoma in situ of skin of other parts of face: Secondary | ICD-10-CM | POA: Diagnosis not present

## 2021-01-24 DIAGNOSIS — L570 Actinic keratosis: Secondary | ICD-10-CM | POA: Diagnosis not present

## 2021-01-24 DIAGNOSIS — Z23 Encounter for immunization: Secondary | ICD-10-CM | POA: Diagnosis not present

## 2021-01-24 DIAGNOSIS — D485 Neoplasm of uncertain behavior of skin: Secondary | ICD-10-CM | POA: Diagnosis not present

## 2021-01-24 DIAGNOSIS — L304 Erythema intertrigo: Secondary | ICD-10-CM | POA: Diagnosis not present

## 2021-01-24 DIAGNOSIS — L280 Lichen simplex chronicus: Secondary | ICD-10-CM | POA: Diagnosis not present

## 2021-01-29 DIAGNOSIS — E230 Hypopituitarism: Secondary | ICD-10-CM | POA: Diagnosis not present

## 2021-01-30 NOTE — Progress Notes (Signed)
Follow-up Visit   Date: 01/31/21   Patrick Harris MRN: [id number] DOB: [birth year]   Interim History: Patrick Harris is a 76 y.o. right-handed Caucasian male with hypertension, hyperlipidemia, GERD, and pituitary adenoma returning to the clinic for follow-up of left leg pain.  The patient was accompanied to the clinic by self.  History of present illness: Starting around 3-4 years, he began having cramp involving the medial left leg, which resolved with gabapentin.  He also has burning sensation over the medial knee, medial lower leg, and into the great toe.  This pain is constant.  There is not thing that provides any relief or exacerbates his pain.  He does not have similar symptoms in the right foot. He has seen Vascular surgery whose evaluated showed mild PAD, but did not feel that his symptoms were related to this.  He has also seen Dr. Ellene Harris at [facility] who performed MRI cervical spine and lumbar spine. I will request these reports.  His MRI lumbar spine did not seems to show any nerve impingement to explain his symptoms.   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  His daughter passed away in 06-Apr-2018 from breast cancer.   UPDATE 01/30/2021:   He was started on gabapentin 300mg  BID for his left leg pain at his last visit. He reports no change. He continues to have burning and tingling involving her medial lower leg and into her foot. No weakness.  He also complains of left hand in tingling and numbness over the dorsum of the thumb and hand.  No weakness.  Symptoms started several months ago when he noticed growth over the left forearm. He has since had the skin lesion resected which was diagnosed as squamous cell cancer.  He also reports having episodic muscle twitches when he is very drowsy and sleeping.   Medications:  Current Outpatient Medications on File Prior to Visit  Medication Sig Dispense Refill   acetaminophen (TYLENOL) 500 MG tablet Take 500 mg by mouth  every 6 (six) hours as needed.     aspirin EC 81 MG tablet Take 1 tablet (81 mg total) by mouth at bedtime.     bromocriptine (PARLODEL) 5 MG capsule Take 15 mg by mouth daily.     Cholecalciferol (VITAMIN D3) 2000 UNITS TABS Take by mouth. 2000 iu in the am, 1000 iu in the pm     Coenzyme Q10 (COQ-10) 100 MG CAPS Take by mouth.     Garlic 2376 MG CAPS Take by mouth.     hydrALAZINE (APRESOLINE) 25 MG tablet Take 1 tablet (25 mg total) by mouth 3 (three) times daily. 270 tablet 3   Iodine, Kelp, (KELP PO) Take 225 mcg by mouth.     Magnesium 250 MG TABS Take by mouth.     Menaquinone-7 (VITAMIN K2) 100 MCG CAPS Take by mouth.     nitroGLYCERIN (NITROSTAT) 0.4 MG SL tablet Place 1 tablet (0.4 mg total) under the tongue every 5 (five) minutes as needed for chest pain. 25 tablet 3   Omega-3 Fatty Acids (OMEGA-3 FISH OIL PO) Take 900 mg by mouth.     omeprazole (PRILOSEC) 20 MG capsule Take 1 capsule (20 mg total) by mouth daily. 90 capsule 3   rosuvastatin (CRESTOR) 20 MG tablet TAKE HALF A TABLET (10 MG) BY MOUTH ONCE DAILY. 45 tablet 3   tamsulosin (FLOMAX) 0.4 MG CAPS capsule Take 0.4 mg by mouth 2 (two) times daily.  Testosterone 20.25 MG/ACT (1.62%) GEL SMARTSIG:2 Pump Topical Daily     Turmeric (QC TUMERIC COMPLEX PO) Take 1,000 mg by mouth.     valsartan (DIOVAN) 160 MG tablet Take 1 tablet (160 mg total) by mouth daily. 90 tablet 3   vitamin C (ASCORBIC ACID) 500 MG tablet Take 500 mg by mouth 2 (two) times daily.     No current facility-administered medications on file prior to visit.    Allergies:  Allergies  Allergen Reactions   Latex     Contact  dermatitis   Lisinopril     Leg pain, inc HR, dizziness, headache   Amlodipine     Edema    Hydrochlorothiazide Other (See Comments)    Possibly caused pancreatitis 2018   Simvastatin     Joint pain    Vital Signs:  BP 136/64    Pulse 64    Ht 6' (1.829 m)    Wt 192 lb (87.1 kg)    SpO2 96%    BMI 26.04 kg/m     Neurological Exam: MENTAL STATUS including orientation to time, place, person, recent and remote memory, attention span and concentration, language, and fund of knowledge is normal.  Speech is not dysarthric.  CRANIAL NERVES:  No visual field defects.  Pupils equal round and reactive to light.  Normal conjugate, extra-ocular eye movements in all directions of gaze.  No ptosis.  Face is symmetric. Palate elevates symmetrically.  Tongue is midline.  MOTOR:  Motor strength is 5/5 in all extremities.  No atrophy, fasciculations or abnormal movements.  No pronator drift.  Tone is normal.    MSRs:  Reflexes are 2+/4 throughout.  SENSORY:  Intact to vibration throughout.  Temperature and pin prick reduced over the left radial sensory distribution.    COORDINATION/GAIT:  Normal finger-to- nose-finger.  Intact rapid alternating movements bilaterally.  Gait narrow based and stable.   Data: n/a  IMPRESSION/PLAN: Left medial knee, lower leg and foot paresthesias, chronic. No evidence of nerve impingement affecting the L4 nerve.   - NCS/EMG of the left leg declined  - Continue gabapentin 300mg  BID  2.  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 Periodic limb movements  - He is already taking gabapentin which should help  Call with an update in 2 months  Return to clinic in 8 months.   Thank you for allowing me to participate in patient's care.  If I can answer any additional questions, I would be pleased to do so.    Sincerely,    Patrick Holzmann K. Posey Pronto, DO

## 2021-01-31 ENCOUNTER — Ambulatory Visit: Payer: Medicare Other | Admitting: Neurology

## 2021-01-31 ENCOUNTER — Encounter: Payer: Self-pay | Admitting: Neurology

## 2021-01-31 ENCOUNTER — Other Ambulatory Visit: Payer: Self-pay

## 2021-01-31 VITALS — BP 136/64 | HR 64 | Ht 72.0 in | Wt 192.0 lb

## 2021-01-31 DIAGNOSIS — G4761 Periodic limb movement disorder: Secondary | ICD-10-CM

## 2021-01-31 DIAGNOSIS — R202 Paresthesia of skin: Secondary | ICD-10-CM | POA: Diagnosis not present

## 2021-01-31 DIAGNOSIS — S5422XA Injury of radial nerve at forearm level, left arm, initial encounter: Secondary | ICD-10-CM | POA: Diagnosis not present

## 2021-01-31 MED ORDER — GABAPENTIN 300 MG PO CAPS: ORAL_CAPSULE | ORAL | 5 refills | Status: DC

## 2021-01-31 NOTE — Patient Instructions (Signed)
Continue gabapentin 300mg  in the morning Increase gabapentin to 600mg  at bedtime   Continue to monitor his blood pressure at home  Call or send MyChart update in 1-2 months  Return to clinic in 8 months

## 2021-02-06 NOTE — Progress Notes (Signed)
Cardiology Office Note  Date: 02/07/2021   ID: Patrick Harris, DOB 11-Dec-1945, MRN [id number]  PCP:  [name], MD  Cardiologist:  Rozann Lesches, MD Electrophysiologist:  None   Chief Complaint  Patient presents with   Cardiac follow-up    History of Present Illness: Patrick Harris is a 76 y.o. male last seen in January 2022.  He is here for a routine follow-up visit.  He does not report any active angina symptoms or nitroglycerin use.  He reports no limitations with ADLs, has been recently pruning his fruit trees.  He has NYHA class II dyspnea, no palpitations or syncope.  I reviewed his medications which are noted below.  Antihypertensive regimen has been uptitrated, states that his blood pressure has been higher since August 2022.  I personally reviewed his ECG today which shows sinus bradycardia.  He did have lab work in August 2022 at which point LDL was 65, he remains on Crestor.  Ischemic testing from January 2020 was low risk as noted below.  Past Medical History:  Diagnosis Date   Arthritis    CAD (coronary artery disease)    CABG with LIMA to LAD and RIMA to RCA August 2000 - Dr. Roxan Hockey   Cancer [facility])    basal cell   CHF (congestive heart failure) (HCC)    COPD (chronic obstructive pulmonary disease) (HCC)    Elevated glycated hemoglobin    Essential hypertension    GERD (gastroesophageal reflux disease)    Heart murmur    Hyperlipidemia    Hypopituitarism (HCC)    Osteopenia    Dr Wilson Singer   Pancreatitis    Peyronie disease    Testosterone deficiency    Dr Wilson Singer    Past Surgical History:  Procedure Laterality Date   Bone spur  2006   5th toe bilaterally   COLONOSCOPY  2007   negative; Dr Sharlett Iles   CORONARY ARTERY BYPASS GRAFT  2001   2 vessels   fracture heel  1967   Bil/ due to fall off ladder   Fractured calcaneus  1969   bilaterally w/ ankle fusion   [place]   right inguinal hernia   KNEE SURGERY Left     MENISECTOMY  2007   R medial, Dr. Lorin Mercy   SHOULDER SURGERY  1994   left   UPPER GASTROINTESTINAL ENDOSCOPY  2007   GERD, Dr Shaune Leeks      Current Outpatient Medications  Medication Sig Dispense Refill   acetaminophen (TYLENOL) 500 MG tablet Take 500 mg by mouth every 6 (six) hours as needed.     aspirin EC 81 MG tablet Take 1 tablet (81 mg total) by mouth at bedtime.     bromocriptine (PARLODEL) 5 MG capsule Take 15 mg by mouth daily.     Cholecalciferol (VITAMIN D3) 2000 UNITS TABS Take by mouth. 2000 iu in the am, 1000 iu in the pm     Coenzyme Q10 (COQ-10) 100 MG CAPS Take by mouth.     gabapentin (NEURONTIN) 300 MG capsule Take 1 tablet at bedtime and 2 tablets at bedtime. 90 capsule 5   Garlic 3825 MG CAPS Take by mouth.     hydrALAZINE (APRESOLINE) 25 MG tablet Take 1 tablet (25 mg total) by mouth 3 (three) times daily. 270 tablet 3   Iodine, Kelp, (KELP PO) Take 225 mcg by mouth.     Magnesium 250 MG TABS Take by mouth.  Menaquinone-7 (VITAMIN K2) 100 MCG CAPS Take by mouth.     nitroGLYCERIN (NITROSTAT) 0.4 MG SL tablet Place 1 tablet (0.4 mg total) under the tongue every 5 (five) minutes as needed for chest pain. 25 tablet 3   Omega-3 Fatty Acids (OMEGA-3 FISH OIL PO) Take 900 mg by mouth.     omeprazole (PRILOSEC) 20 MG capsule Take 1 capsule (20 mg total) by mouth daily. 90 capsule 3   rosuvastatin (CRESTOR) 20 MG tablet TAKE HALF A TABLET (10 MG) BY MOUTH ONCE DAILY. 45 tablet 3   tamsulosin (FLOMAX) 0.4 MG CAPS capsule Take 0.4 mg by mouth 2 (two) times daily.     Testosterone 20.25 MG/ACT (1.62%) GEL SMARTSIG:2 Pump Topical Daily     Turmeric (QC TUMERIC COMPLEX PO) Take 1,000 mg by mouth.     valsartan (DIOVAN) 160 MG tablet Take 1 tablet (160 mg total) by mouth daily. 90 tablet 3   vitamin C (ASCORBIC ACID) 500 MG tablet Take 500 mg by mouth 2 (two) times daily.     No current facility-administered medications for this visit.   Allergies:  Latex,  Lisinopril, Amlodipine, Hydrochlorothiazide, and Simvastatin   ROS: No orthopnea or PND.  No syncope.  Physical Exam: VS:  BP 130/80    Pulse (!) 59    Ht 6' (1.829 m)    Wt 190 lb (86.2 kg)    SpO2 96%    BMI 25.77 kg/m , BMI Body mass index is 25.77 kg/m.  Wt Readings from Last 3 Encounters:  02/07/21 190 lb (86.2 kg)  01/31/21 192 lb (87.1 kg)  10/25/20 181 lb (82.1 kg)    General: Patient appears comfortable at rest. HEENT: Conjunctiva and lids normal, wearing a mask. Neck: Supple, no elevated JVP or carotid bruits, no thyromegaly. Lungs: Clear to auscultation, nonlabored breathing at rest. Cardiac: Regular rate and rhythm, no S3, 1/6 systolic murmur, no pericardial rub. Extremities: No pitting edema.  ECG:  An ECG dated 01/27/2020 was personally reviewed today and demonstrated:  Sinus rhythm with PACs.  Recent Labwork: 08/29/2020: ALT 16; AST 19; BUN 14; Creatinine, Ser 0.99; Hemoglobin 17.2; Platelets 158.0; Potassium 4.4; Sodium 137; TSH 1.63     Component Value Date/Time   CHOL 118 08/29/2020 0858   TRIG 96.0 08/29/2020 0858   HDL 33.60 (L) 08/29/2020 0858   CHOLHDL 3 08/29/2020 0858   VLDL 19.2 08/29/2020 0858   LDLCALC 65 08/29/2020 0858   LDLCALC 71 08/25/2019 0847    Other Studies Reviewed Today:  [facility] Myoview 01/15/2018: There was no ST segment deviation noted during stress. Defect 1: There is a medium defect of moderate severity present in the mid inferoseptal, mid inferior and apical inferior location. Findings consistent with probable scar with some degree of soft tissue attenuation contributing to the defects. This is a low risk study overall. No ischemic zones. Nuclear stress EF: 48%.   Echocardiogram 01/15/2018: Study Conclusions   - Left ventricle: The cavity size was normal. Wall thickness was   increased in a pattern of mild LVH. Systolic function was normal.   The estimated ejection fraction was in the range of 55% to 60%.   Wall motion was  normal; there were no regional wall motion   abnormalities. Features are consistent with a pseudonormal left   ventricular filling pattern, with concomitant abnormal relaxation   and increased filling pressure (grade 2 diastolic dysfunction).   Doppler parameters are consistent with indeterminate ventricular   filling pressure. - Aortic valve:  Mildly to moderately calcified annulus. Mildly   thickened, mildly calcified leaflets. Sclerosis without stenosis. - Left atrium: The atrium was mildly dilated. - Right ventricle: Systolic function was mildly reduced. - Systemic veins: The IVC is dilated with normal respiratory   variation. Estimated right atrial pressure is 8 mmHg.  ABIs 10/03/2020: Summary:  Right: Resting right ankle-brachial index indicates moderate right lower  extremity arterial disease. The right toe-brachial index is abnormal.   Left: Resting left ankle-brachial index indicates mild left lower  extremity arterial disease. The left toe-brachial index is abnormal.   Assessment and Plan:  1.  Multivessel CAD status post CABG in 2000.  Follow-up ischemic testing in 2020 was low risk and he does not describe any active angina or nitroglycerin use at this time.  Plan to continue medical therapy and observation.  He has resting bradycardia and is therefore not on beta-blocker therapy.  Continue aspirin, Diovan, Crestor, refill provided for fresh bottle of nitroglycerin.  2.  Mixed hyperlipidemia, doing well on Crestor.  Recent LDL 65.  Medication Adjustments/Labs and Tests Ordered: Current medicines are reviewed at length with the patient today.  Concerns regarding medicines are outlined above.   Tests Ordered: Orders Placed This Encounter  Procedures   EKG 12-Lead    Medication Changes: No orders of the defined types were placed in this encounter.   Disposition:  Follow up  1 year.  Signed, Satira Sark, MD, [facility] 02/07/2021 9:57 AM    [facility] [place], [place], [place] [address] Phone: [phone]; Fax: [phone]

## 2021-02-07 ENCOUNTER — Ambulatory Visit: Payer: Medicare Other | Admitting: Cardiology

## 2021-02-07 ENCOUNTER — Encounter: Payer: Self-pay | Admitting: Cardiology

## 2021-02-07 VITALS — BP 130/80 | HR 59 | Ht 72.0 in | Wt 190.0 lb

## 2021-02-07 DIAGNOSIS — I25119 Atherosclerotic heart disease of native coronary artery with unspecified angina pectoris: Secondary | ICD-10-CM | POA: Diagnosis not present

## 2021-02-07 DIAGNOSIS — E782 Mixed hyperlipidemia: Secondary | ICD-10-CM | POA: Diagnosis not present

## 2021-02-07 MED ORDER — NITROGLYCERIN 0.4 MG SL SUBL: 0.4000 mg | SUBLINGUAL_TABLET | SUBLINGUAL | 3 refills | Status: DC | PRN

## 2021-02-07 NOTE — Patient Instructions (Addendum)

## 2021-03-14 DIAGNOSIS — Z23 Encounter for immunization: Secondary | ICD-10-CM | POA: Diagnosis not present

## 2021-03-14 DIAGNOSIS — L280 Lichen simplex chronicus: Secondary | ICD-10-CM | POA: Diagnosis not present

## 2021-03-14 DIAGNOSIS — L309 Dermatitis, unspecified: Secondary | ICD-10-CM | POA: Diagnosis not present

## 2021-03-29 ENCOUNTER — Encounter: Payer: Self-pay | Admitting: Internal Medicine

## 2021-03-29 NOTE — Progress Notes (Signed)
? ? ? ? ?Subjective:  ? ? Patient ID: Patrick Harris, male    DOB: 1945-03-11, 76 y.o.   MRN: [id number] ? ?This visit occurred during the SARS-CoV-2 public health emergency.  Safety protocols were in place, including screening questions prior to the visit, additional usage of staff PPE, and extensive cleaning of exam room while observing appropriate contact time as indicated for disinfecting solutions.   ? ? ?HPI ?Patrick Harris is here for follow up of his chronic medical problems , including htn. ? ?BP has been elevated -  nurse came to his house and BP was elevated - since then has been checking 153/78-178/77   HR 63-83 ? ? ?H/o smoking - smoked 1ppd x 40-50 years - quit about 15 years ago.  He is concerned about different test that can be done for screening for cancer throughout his body. ? ? ?Medications and allergies reviewed with patient and updated if appropriate. ? ?Current Outpatient Medications on File Prior to Visit  ?Medication Sig Dispense Refill  ? acetaminophen (TYLENOL) 500 MG tablet Take 500 mg by mouth every 6 (six) hours as needed.    ? aspirin EC 81 MG tablet Take 1 tablet (81 mg total) by mouth at bedtime.    ? bromocriptine (PARLODEL) 5 MG capsule Take 15 mg by mouth daily.    ? Cholecalciferol (VITAMIN D3) 2000 UNITS TABS Take by mouth. 2000 iu in the am, 1000 iu in the pm    ? Coenzyme Q10 (COQ-10) 100 MG CAPS Take by mouth.    ? gabapentin (NEURONTIN) 300 MG capsule Take 1 tablet at bedtime and 2 tablets at bedtime. 90 capsule 5  ? Garlic 6834 MG CAPS Take by mouth.    ? hydrALAZINE (APRESOLINE) 25 MG tablet Take 1 tablet (25 mg total) by mouth 3 (three) times daily. 270 tablet 3  ? Iodine, Kelp, (KELP PO) Take 225 mcg by mouth.    ? Magnesium 250 MG TABS Take by mouth.    ? Menaquinone-7 (VITAMIN K2) 100 MCG CAPS Take by mouth.    ? nitroGLYCERIN (NITROSTAT) 0.4 MG SL tablet Place 1 tablet (0.4 mg total) under the tongue every 5 (five) minutes x 3 doses as needed for chest pain (if no relief  after 2nd dose, proceed to ED or call 911). 25 tablet 3  ? Omega-3 Fatty Acids (OMEGA-3 FISH OIL PO) Take 900 mg by mouth.    ? omeprazole (PRILOSEC) 20 MG capsule Take 1 capsule (20 mg total) by mouth daily. 90 capsule 3  ? rosuvastatin (CRESTOR) 20 MG tablet TAKE HALF A TABLET (10 MG) BY MOUTH ONCE DAILY. 45 tablet 3  ? tamsulosin (FLOMAX) 0.4 MG CAPS capsule Take 0.4 mg by mouth 2 (two) times daily.    ? Testosterone 20.25 MG/ACT (1.62%) GEL SMARTSIG:2 Pump Topical Daily    ? Turmeric (QC TUMERIC COMPLEX PO) Take 1,000 mg by mouth.    ? vitamin C (ASCORBIC ACID) 500 MG tablet Take 500 mg by mouth 2 (two) times daily.    ? ?No current facility-administered medications on file prior to visit.  ? ? ? ?Review of Systems  ?Constitutional:  Negative for fever.  ?Respiratory:  Positive for shortness of breath (a little at times - a little worse in past few months) and wheezing. Negative for cough.   ?Cardiovascular:  Positive for leg swelling (mild at times). Negative for chest pain and palpitations.  ?Neurological:  Positive for headaches (once). Negative for dizziness and light-headedness.  ? ?   ?  Objective:  ? ?Vitals:  ? 03/30/21 0902 03/30/21 0945  ?BP: (!) 150/70 140/68  ?Pulse: 65   ?Temp: 97.9 ?F (36.6 ?C)   ?SpO2: 93%   ? ?BP Readings from Last 3 Encounters:  ?03/30/21 140/68  ?02/07/21 130/80  ?01/31/21 136/64  ? ?Wt Readings from Last 3 Encounters:  ?03/30/21 188 lb (85.3 kg)  ?02/07/21 190 lb (86.2 kg)  ?01/31/21 192 lb (87.1 kg)  ? ?Body mass index is 25.5 kg/m?. ? ?  ?Physical Exam ?Constitutional:   ?   General: He is not in acute distress. ?   Appearance: Normal appearance. He is not ill-appearing.  ?HENT:  ?   Head: Normocephalic and atraumatic.  ?Eyes:  ?   Conjunctiva/sclera: Conjunctivae normal.  ?Cardiovascular:  ?   Rate and Rhythm: Normal rate and regular rhythm.  ?   Heart sounds: Normal heart sounds. No murmur heard. ?Pulmonary:  ?   Effort: Pulmonary effort is normal. No respiratory distress.   ?   Breath sounds: Normal breath sounds. No wheezing or rales.  ?Musculoskeletal:  ?   Right lower leg: No edema.  ?   Left lower leg: No edema.  ?Skin: ?   General: Skin is warm and dry.  ?   Findings: No rash.  ?Neurological:  ?   Mental Status: He is alert. Mental status is at baseline.  ?Psychiatric:     ?   Mood and Affect: Mood normal.  ? ?   ? ?Lab Results  ?Component Value Date  ? WBC 4.6 08/29/2020  ? HGB 17.2 (H) 08/29/2020  ? HCT 51.6 08/29/2020  ? PLT 158.0 08/29/2020  ? GLUCOSE 94 08/29/2020  ? CHOL 118 08/29/2020  ? TRIG 96.0 08/29/2020  ? HDL 33.60 (L) 08/29/2020  ? [place] 65 08/29/2020  ? ALT 16 08/29/2020  ? AST 19 08/29/2020  ? NA 137 08/29/2020  ? K 4.4 08/29/2020  ? CL 103 08/29/2020  ? CREATININE 0.99 08/29/2020  ? BUN 14 08/29/2020  ? CO2 27 08/29/2020  ? TSH 1.63 08/29/2020  ? PSA 1.5 08/25/2019  ? INR 1.17 01/18/2015  ? HGBA1C 6.0 08/29/2020  ? ? ? ?Assessment & Plan:  ? ? ?See Problem List for Assessment and Plan of chronic medical problems.  ? ? ?

## 2021-03-30 ENCOUNTER — Other Ambulatory Visit: Payer: Self-pay

## 2021-03-30 ENCOUNTER — Ambulatory Visit (INDEPENDENT_AMBULATORY_CARE_PROVIDER_SITE_OTHER): Payer: Medicare Other | Admitting: Internal Medicine

## 2021-03-30 VITALS — BP 140/68 | HR 65 | Temp 97.9°F | Ht 72.0 in | Wt 188.0 lb

## 2021-03-30 DIAGNOSIS — I10 Essential (primary) hypertension: Secondary | ICD-10-CM | POA: Diagnosis not present

## 2021-03-30 DIAGNOSIS — Z122 Encounter for screening for malignant neoplasm of respiratory organs: Secondary | ICD-10-CM | POA: Diagnosis not present

## 2021-03-30 DIAGNOSIS — Z5111 Encounter for antineoplastic chemotherapy: Secondary | ICD-10-CM | POA: Insufficient documentation

## 2021-03-30 MED ORDER — VALSARTAN 320 MG PO TABS: 320.0000 mg | ORAL_TABLET | Freq: Every day | ORAL | 3 refills | Status: DC

## 2021-03-30 NOTE — Assessment & Plan Note (Addendum)
Chronic ?BP not controlled ?Continue hydralazine 25 mg 3 times daily, increase Diovan to 320 mg daily ?Monitor BP at home-discussed goal ?Discussed consequences of blood pressure not being controlled ?Follow-up in 3 weeks-we will get blood work at that time ? ?

## 2021-03-30 NOTE — Assessment & Plan Note (Signed)
He is interested in screening for lung cancer ?He has a history of smoking-smoked 1 pack/day x 40-50 years.  Quit about 15 years ago. ?He does have shortness of breath on occasion, occasional wheezing especially at night ?

## 2021-03-30 NOTE — Patient Instructions (Addendum)
? ? ? ? ?  Medications changes include :   Diovan 320 mg daily ? ? ?Your prescription(s) have been sent to your pharmacy.  ? ? ?A referral was ordered for lung cancer screening.     Someone from that office will call you to schedule an appointment.  ? ? ?Return in about 3 weeks (around 04/20/2021) for hypertension. ? ?

## 2021-04-19 ENCOUNTER — Encounter: Payer: Self-pay | Admitting: Internal Medicine

## 2021-04-19 NOTE — Patient Instructions (Addendum)
? ? ?  Call [facility] at Phone: [phone]  ? ? ? ?Blood work was ordered.   ? ? ?Medications changes include :   stop valsartan, start avapro ? ? ?Your prescription(s) have been sent to your pharmacy.  ? ? ? ?Return for follow up as scheduled. ? ?

## 2021-04-19 NOTE — Progress Notes (Signed)
? ? ? ? ?Subjective:  ? ? Patient ID: Patrick Harris, male    DOB: 07-Aug-1945, 76 y.o.   MRN: [id number] ? ?This visit occurred during the SARS-CoV-2 public health emergency.  Safety protocols were in place, including screening questions prior to the visit, additional usage of staff PPE, and extensive cleaning of exam room while observing appropriate contact time as indicated for disinfecting solutions.   ? ? ?HPI ?Fumio is here for follow up of his chronic medical problems, including hypertension ? ?Here for follow-up from 3 weeks ago-increased Diovan to 320 mg daily. ? ?With the increased dose he has increased nausea.  He states he was having diarrhea even before the increase in dose, but had not mentioned that.  No GERD.  He denies abdominal pain.  The nausea lasts all day.   ? ? ?For the past few month he feels tired.  If he walks a lot his legs burn.  Both legs burn, and weakness.  Saw surgeon for neck - he did not think the leg burning/weakness is related to his neck or back.  He saw vascular and did discuss this with them.  He did not feel it was vascular in nature.  Symptoms are not getting worse.  Only with walking.  No symptoms with sitting/laying.   ? ? ?Medications and allergies reviewed with patient and updated if appropriate. ? ?Current Outpatient Medications on File Prior to Visit  ?Medication Sig Dispense Refill  ? acetaminophen (TYLENOL) 500 MG tablet Take 500 mg by mouth every 6 (six) hours as needed.    ? aspirin EC 81 MG tablet Take 1 tablet (81 mg total) by mouth at bedtime.    ? bromocriptine (PARLODEL) 5 MG capsule Take 15 mg by mouth daily.    ? Cholecalciferol (VITAMIN D3) 2000 UNITS TABS Take by mouth. 2000 iu in the am, 1000 iu in the pm    ? Coenzyme Q10 (COQ-10) 100 MG CAPS Take by mouth.    ? gabapentin (NEURONTIN) 300 MG capsule Take 1 tablet at bedtime and 2 tablets at bedtime. 90 capsule 5  ? Garlic 4481 MG CAPS Take by mouth.    ? hydrALAZINE (APRESOLINE) 25 MG tablet Take 1  tablet (25 mg total) by mouth 3 (three) times daily. 270 tablet 3  ? Iodine, Kelp, (KELP PO) Take 225 mcg by mouth.    ? Magnesium 250 MG TABS Take by mouth.    ? Menaquinone-7 (VITAMIN K2) 100 MCG CAPS Take by mouth.    ? nitroGLYCERIN (NITROSTAT) 0.4 MG SL tablet Place 1 tablet (0.4 mg total) under the tongue every 5 (five) minutes x 3 doses as needed for chest pain (if no relief after 2nd dose, proceed to ED or call 911). 25 tablet 3  ? Omega-3 Fatty Acids (OMEGA-3 FISH OIL PO) Take 900 mg by mouth.    ? pyrithione zinc (SELSUN BLUE DRY SCALP) 1 % shampoo 1 application to scalp    ? rosuvastatin (CRESTOR) 20 MG tablet TAKE HALF A TABLET (10 MG) BY MOUTH ONCE DAILY. 45 tablet 3  ? tamsulosin (FLOMAX) 0.4 MG CAPS capsule Take 0.4 mg by mouth 2 (two) times daily.    ? Testosterone 20.25 MG/ACT (1.62%) GEL SMARTSIG:2 Pump Topical Daily    ? Turmeric (QC TUMERIC COMPLEX PO) Take 1,000 mg by mouth.    ? vitamin C (ASCORBIC ACID) 500 MG tablet Take 500 mg by mouth 2 (two) times daily.    ? tadalafil (CIALIS) 5  MG tablet Take 1 tablet by mouth daily.    ? ?No current facility-administered medications on file prior to visit.  ? ? ? ?Review of Systems  ?Constitutional:  Negative for fever.  ?Cardiovascular:  Positive for leg swelling (mild, wears compression socks). Negative for chest pain and palpitations.  ?Gastrointestinal:  Positive for nausea. Negative for abdominal pain.  ?Neurological:  Negative for light-headedness and headaches.  ? ?   ?Objective:  ? ?Vitals:  ? 04/20/21 0826  ?BP: 130/66  ?Pulse: 68  ?Temp: 98 ?F (36.7 ?C)  ?SpO2: 95%  ? ?BP Readings from Last 3 Encounters:  ?04/20/21 130/66  ?03/30/21 140/68  ?02/07/21 130/80  ? ?Wt Readings from Last 3 Encounters:  ?04/20/21 187 lb (84.8 kg)  ?03/30/21 188 lb (85.3 kg)  ?02/07/21 190 lb (86.2 kg)  ? ?Body mass index is 25.36 kg/m?. ? ?  ?Physical Exam ?Constitutional:   ?   General: He is not in acute distress. ?   Appearance: Normal appearance. He is not  ill-appearing.  ?HENT:  ?   Head: Normocephalic and atraumatic.  ?Eyes:  ?   Conjunctiva/sclera: Conjunctivae normal.  ?Cardiovascular:  ?   Rate and Rhythm: Normal rate and regular rhythm.  ?   Heart sounds: Normal heart sounds. No murmur heard. ?Pulmonary:  ?   Effort: Pulmonary effort is normal. No respiratory distress.  ?   Breath sounds: Normal breath sounds. No wheezing or rales.  ?Musculoskeletal:  ?   Right lower leg: No edema.  ?   Left lower leg: No edema.  ?Skin: ?   General: Skin is warm and dry.  ?   Findings: No rash.  ?Neurological:  ?   Mental Status: He is alert. Mental status is at baseline.  ?Psychiatric:     ?   Mood and Affect: Mood normal.  ? ?   ? ?Lab Results  ?Component Value Date  ? WBC 4.6 08/29/2020  ? HGB 17.2 (H) 08/29/2020  ? HCT 51.6 08/29/2020  ? PLT 158.0 08/29/2020  ? GLUCOSE 94 08/29/2020  ? CHOL 118 08/29/2020  ? TRIG 96.0 08/29/2020  ? HDL 33.60 (L) 08/29/2020  ? [place] 65 08/29/2020  ? ALT 16 08/29/2020  ? AST 19 08/29/2020  ? NA 137 08/29/2020  ? K 4.4 08/29/2020  ? CL 103 08/29/2020  ? CREATININE 0.99 08/29/2020  ? BUN 14 08/29/2020  ? CO2 27 08/29/2020  ? TSH 1.63 08/29/2020  ? PSA 1.5 08/25/2019  ? INR 1.17 01/18/2015  ? HGBA1C 6.0 08/29/2020  ? ? ? ?Assessment & Plan:  ? ? ?See Problem List for Assessment and Plan of chronic medical problems.  ? ? ?

## 2021-04-20 ENCOUNTER — Ambulatory Visit (INDEPENDENT_AMBULATORY_CARE_PROVIDER_SITE_OTHER): Payer: Medicare Other | Admitting: Internal Medicine

## 2021-04-20 VITALS — BP 130/66 | HR 68 | Temp 98.0°F | Ht 72.0 in | Wt 187.0 lb

## 2021-04-20 DIAGNOSIS — I739 Peripheral vascular disease, unspecified: Secondary | ICD-10-CM

## 2021-04-20 DIAGNOSIS — R110 Nausea: Secondary | ICD-10-CM | POA: Insufficient documentation

## 2021-04-20 DIAGNOSIS — I10 Essential (primary) hypertension: Secondary | ICD-10-CM | POA: Diagnosis not present

## 2021-04-20 LAB — BASIC METABOLIC PANEL
BUN: 16 mg/dL (ref 6–23)
CO2: 28 mEq/L (ref 19–32)
Calcium: 9.5 mg/dL (ref 8.4–10.5)
Chloride: 100 mEq/L (ref 96–112)
Creatinine, Ser: 0.96 mg/dL (ref 0.40–1.50)
GFR: 77.18 mL/min (ref >60.00)
Glucose, Bld: 92 mg/dL (ref 70–99)
Potassium: 4.2 mEq/L (ref 3.5–5.1)
Sodium: 133 mEq/L — ABNORMAL LOW (ref 135–145)

## 2021-04-20 MED ORDER — IRBESARTAN 300 MG PO TABS: 300.0000 mg | ORAL_TABLET | Freq: Every day | ORAL | 1 refills | Status: DC

## 2021-04-20 MED ORDER — OMEPRAZOLE 20 MG PO CPDR: 20.0000 mg | DELAYED_RELEASE_CAPSULE | Freq: Two times a day (BID) | ORAL | 3 refills | Status: DC

## 2021-04-20 NOTE — Assessment & Plan Note (Signed)
Subacute ?States nausea that has been going on for a while, but worse since increasing the dose of the valsartan ?Initially he thought it was related to that, but also realizes he has had some mild gastritis in the past ?He did increase the omeprazole to 40 mg daily-he will continue this ?We switched valsartan to a different medication just to be on the safe side ?

## 2021-04-20 NOTE — Assessment & Plan Note (Signed)
Chronic ?Has burning and weakness, achiness in legs when walking ?No symptoms with rest ?Has seen neurosurgery and vascular-not related to back, neck or PAD ?Sees neurology in September and will discuss with Dr. Posey Pronto ?

## 2021-04-20 NOTE — Assessment & Plan Note (Signed)
Chronic ?Following with vascular ?He does have leg claudication, but vascular does not feel it is related to his PAD ?Continue Crestor 10 mg daily, Aspirin 81 mg daily ?

## 2021-04-20 NOTE — Assessment & Plan Note (Addendum)
Chronic ?Blood pressure has been very well controlled at home and is good here ?Continue hydralazine 25 mg 3 times daily ?Nausea unlikely to be related to valsartan, but will change valsartan to Avapro 300 mg daily ?He knows the nausea may also be related to stomach issues since he has had similar symptoms in the past and he did increase the omeprazole ?Continue to monitor BP at home ?BMP today ? ?

## 2021-04-24 DIAGNOSIS — D3611 Benign neoplasm of peripheral nerves and autonomic nervous system of face, head, and neck: Secondary | ICD-10-CM | POA: Diagnosis not present

## 2021-04-24 DIAGNOSIS — L570 Actinic keratosis: Secondary | ICD-10-CM | POA: Diagnosis not present

## 2021-04-24 DIAGNOSIS — L820 Inflamed seborrheic keratosis: Secondary | ICD-10-CM | POA: Diagnosis not present

## 2021-04-24 DIAGNOSIS — L280 Lichen simplex chronicus: Secondary | ICD-10-CM | POA: Diagnosis not present

## 2021-04-24 DIAGNOSIS — G629 Polyneuropathy, unspecified: Secondary | ICD-10-CM | POA: Diagnosis not present

## 2021-04-24 DIAGNOSIS — D485 Neoplasm of uncertain behavior of skin: Secondary | ICD-10-CM | POA: Diagnosis not present

## 2021-05-14 ENCOUNTER — Other Ambulatory Visit: Payer: Self-pay | Admitting: Internal Medicine

## 2021-05-14 ENCOUNTER — Telehealth: Payer: Self-pay | Admitting: Acute Care

## 2021-05-14 DIAGNOSIS — Z122 Encounter for screening for malignant neoplasm of respiratory organs: Secondary | ICD-10-CM

## 2021-05-14 DIAGNOSIS — R0602 Shortness of breath: Secondary | ICD-10-CM

## 2021-05-14 NOTE — Telephone Encounter (Signed)
Spoke with patient by phone to verify smoking history for lung cancer screening.  Patient did quit smoking in year 2000 which would make him ineligible for LCS LDCT due to over 15 years as non-smoker.  Patient is interested in having a CT chest.  Discussed alternative to LCS LDCT would be CT chest wo contrast, if recommended by PCP. Will route note to PCP that patient is interested in the CT Chest and would like her recommendation/review to see if that is appropriate for his care.  Routed this message to PCP for update.  Patient acknowledged understanding and referral for LCS is closed. ?

## 2021-05-15 NOTE — Telephone Encounter (Signed)
Spoke with patient today. 

## 2021-05-15 NOTE — Addendum Note (Signed)
Addended by: [name] on: 05/15/2021 07:53 AM ? ? Modules accepted: Orders ? ?

## 2021-05-15 NOTE — Telephone Encounter (Signed)
Let patient know I ordered a CT of the lungs.    We will have to see if his insurance will cover this ---- if they do [place] [facility] will call him to schedule this. ?

## 2021-05-24 NOTE — Progress Notes (Signed)
05/25/2021 Patrick Harris [id number] [birth year]  Referring provider: [name], MD Primary GI doctor: [name] Harris  ASSESSMENT AND Harris:  Gastroesophageal reflux disease without esophagitis Lifestyle changes discussed, avoid NSAIDS Continue current medications Will refill prilosec and increase to twice a day -     CBC with Differential/Platelet; Future -     Comprehensive metabolic panel; Future -     TSH; Future -     omeprazole (PRILOSEC) 40 MG capsule; Take 1 capsule (40 mg total) by mouth daily before breakfast. -     Lipase; Future -     Amylase; Future  Generalized abdominal pain Very generalized, progressive AB pain, no alarm symptoms. Has had pancreatitis in the past, thought to be medication induced, will check lipase/amylase today and will proceed with CT AB if positive.  Get Xray to evaluate stool burden.  Check labs -     CBC with Differential/Platelet; Future -     Comprehensive metabolic panel; Future -     TSH; Future -     High sensitivity CRP; Future -     DG Abd 1 View; Future -     Lipase; Future -     Amylase; Future  History of adenomatous polyp of colon 08/2016 Colonoscopy adenomatous polyps, diverticula and recall 5 years. (08/2021) Will schedule for 6 week follow up and can schedule for repeat colon at that time.    History of Present Illness:  76 y.o. male  with a past medical history of coronary artery disease status post bypass 2000, heart failure, COPD, hyperlipidemia, history of pancreatitis, peripheral arterial disease, and others listed below, returns to clinic today for evaluation of epigastric burning and intermittent nasuea.   Patient last seen 03/2019 by [name] Harris for nonspecific chest pain 03/29/2019 EGD normal esophagus, gastritis, small hiatal hernia, normal duodenum negative biopsies 05/05/2019 CT enterography due to follow-up small bowel abnormality noted on CT 03/30/2019 normal liver, normal pancreas without masses or  inflammatory changes normal spleen normal bowels without inflammation or obstruction.  No acute findings 08/2016 Colonoscopy adenomatous polyps, diverticula and recall 5 years. (08/2021)  States he has "a little warmth" in epigastric area.  He sleeps on elevated bed.  About 2 months ago started worsening AB pain, added on prilosec 20 mg at night to his omeprazole 40 mg in AM. In the past was on twice a day.  He has had AB pain since 2002, worse the last 2-3 months.  Dull ache from epigastric to below belly button, constant, has been first thing in the AM, no nocturnal symptoms.  Can be a little bit better after lunch, feels like someone "hit him in the stomach".  Has had some nausea 2 x in last month, no vomiting.  States a month ago he had some every other day BM's, had run out of fiber.  No melena or BRB in stool.  Feels could be from bromocriptine 3 a day, increased from 1 to 3 a year ago and feels it is worse with AB pain. Missed it for 3 days and felt better.  Started on gabapenin 6 months ago No NSAIDS, no ETOH.   CBC  08/29/2020  HGB 17.2 MCV 92.7 without evidence of anemia WBC 4.6 Platelets 158.0 08/25/2019  B12 832 Kidney function 04/20/2021  BUN 16 Cr 0.96  Potassium 4.2   LFTs 08/29/2020  AST 19 ALT 16 Alkphos 47 TBili 0.7 03/18/2019 LIPASE 15.0 08/29/2020 TSH 1.63   Current Medications:   Current  Outpatient Medications (Endocrine & Metabolic):    Testosterone 20.25 MG/ACT (1.62%) GEL, SMARTSIG:2 Pump Topical Daily  Current Outpatient Medications (Cardiovascular):    hydrALAZINE (APRESOLINE) 25 MG tablet, Take 1 tablet (25 mg total) by mouth 3 (three) times daily.   irbesartan (AVAPRO) 300 MG tablet, TAKE 1 TABLET BY MOUTH EVERY DAY   nitroGLYCERIN (NITROSTAT) 0.4 MG SL tablet, Place 1 tablet (0.4 mg total) under the tongue every 5 (five) minutes x 3 doses as needed for chest pain (if no relief after 2nd dose, proceed to ED or call 911).   rosuvastatin (CRESTOR) 20 MG  tablet, TAKE HALF A TABLET (10 MG) BY MOUTH ONCE DAILY.   tadalafil (CIALIS) 5 MG tablet, Take 1 tablet by mouth daily.   Current Outpatient Medications (Analgesics):    acetaminophen (TYLENOL) 500 MG tablet, Take 500 mg by mouth every 6 (six) hours as needed.   aspirin EC 81 MG tablet, Take 1 tablet (81 mg total) by mouth at bedtime.   Current Outpatient Medications (Other):    bromocriptine (PARLODEL) 5 MG capsule, Take 15 mg by mouth daily.   Cholecalciferol (VITAMIN D3) 2000 UNITS TABS, Take by mouth. 2000 iu in the am, 1000 iu in the pm   Coenzyme Q10 (COQ-10) 100 MG CAPS, Take by mouth.   gabapentin (NEURONTIN) 300 MG capsule, Take 1 tablet at bedtime and 2 tablets at bedtime.   Garlic 2637 MG CAPS, Take by mouth.   Magnesium 250 MG TABS, Take by mouth.   Menaquinone-7 (VITAMIN K2) 100 MCG CAPS, Take by mouth.   Omega-3 Fatty Acids (OMEGA-3 FISH OIL PO), Take 900 mg by mouth.   omeprazole (PRILOSEC) 40 MG capsule, Take 1 capsule (40 mg total) by mouth daily before breakfast.   pyrithione zinc (SELSUN BLUE DRY SCALP) 1 % shampoo, 1 application to scalp   tamsulosin (FLOMAX) 0.4 MG CAPS capsule, Take 0.4 mg by mouth 2 (two) times daily.   vitamin C (ASCORBIC ACID) 500 MG tablet, Take 500 mg by mouth 2 (two) times daily.  Surgical History:  He  has a past surgical history that includes Fractured calcaneus (1969); Hernia repair (1984); Shoulder surgery (1994); Coronary artery bypass graft (2001); Menisectomy (2007); Colonoscopy (2007); Upper gastrointestinal endoscopy (2007); Vasectomy; fracture heel (1967); Bone spur (2006); and Knee surgery (Left). Family History:  His family history includes Aortic aneurysm in his brother; Atrial fibrillation in his brother and sister; Cancer in his maternal uncle; Colon cancer in his paternal uncle; Diabetes in his mother; Heart attack (age of onset: 75) in his father; Heart disease in his brother; Hypertension in his mother; Leukemia in his sister;  Parkinson's disease in his sister; Stroke in his sister, sister, and sister; Stroke (age of onset: 22) in his mother. Social History:   reports that he quit smoking about 23 years ago. His smoking use included cigarettes. He has never used smokeless tobacco. He reports that he does not drink alcohol and does not use drugs.  Current Medications, Allergies, Past Medical History, Past Surgical History, Family History and Social History were reviewed in [employer] record.  Physical Exam: BP (!) 152/66   Pulse 68   Ht 6' (1.829 m)   Wt 185 lb (83.9 kg)   BMI 25.09 kg/m  General:   Pleasant, well developed male in no acute distress Heart : Regular rate and rhythm; no murmurs Pulm: Clear anteriorly; no wheezing Abdomen:  Soft, Obese AB, Active bowel sounds. mild tenderness in the entire abdomen.  Without guarding and Without rebound, No organomegaly appreciated. Ventral hernia Rectal: Not evaluated Extremities:  without  edema. Neurologic:  Alert and  oriented x4;  No focal deficits.  Psych:  Cooperative. Normal mood and affect.   Vladimir Crofts, PA-C 05/25/21

## 2021-05-25 ENCOUNTER — Ambulatory Visit: Payer: Medicare Other | Admitting: Physician Assistant

## 2021-05-25 ENCOUNTER — Ambulatory Visit (INDEPENDENT_AMBULATORY_CARE_PROVIDER_SITE_OTHER)
Admission: RE | Admit: 2021-05-25 | Discharge: 2021-05-25 | Disposition: A | Discharge status: Home / Self Care | Payer: Medicare Other | Source: Ambulatory Visit | Attending: Physician Assistant | Admitting: Physician Assistant

## 2021-05-25 ENCOUNTER — Encounter: Payer: Self-pay | Admitting: Physician Assistant

## 2021-05-25 ENCOUNTER — Other Ambulatory Visit (INDEPENDENT_AMBULATORY_CARE_PROVIDER_SITE_OTHER): Payer: Medicare Other

## 2021-05-25 VITALS — BP 134/66 | HR 68 | Ht 72.0 in | Wt 185.0 lb

## 2021-05-25 DIAGNOSIS — R1084 Generalized abdominal pain: Secondary | ICD-10-CM

## 2021-05-25 DIAGNOSIS — R110 Nausea: Secondary | ICD-10-CM | POA: Diagnosis not present

## 2021-05-25 DIAGNOSIS — Z86010 Personal history of colonic polyps: Secondary | ICD-10-CM

## 2021-05-25 DIAGNOSIS — K219 Gastro-esophageal reflux disease without esophagitis: Secondary | ICD-10-CM | POA: Diagnosis not present

## 2021-05-25 LAB — COMPREHENSIVE METABOLIC PANEL
ALT: 15 U/L (ref 0–53)
AST: 16 U/L (ref 0–37)
Albumin: 4 g/dL (ref 3.5–5.2)
Alkaline Phosphatase: 59 U/L (ref 39–117)
BUN: 14 mg/dL (ref 6–23)
CO2: 25 mEq/L (ref 19–32)
Calcium: 9.3 mg/dL (ref 8.4–10.5)
Chloride: 105 mEq/L (ref 96–112)
Creatinine, Ser: 0.97 mg/dL (ref 0.40–1.50)
GFR: 76.18 mL/min (ref >60.00)
Glucose, Bld: 99 mg/dL (ref 70–99)
Potassium: 4.1 mEq/L (ref 3.5–5.1)
Sodium: 137 mEq/L (ref 135–145)
Total Bilirubin: 0.8 mg/dL (ref 0.2–1.2)
Total Protein: 7.2 g/dL (ref 6.0–8.3)

## 2021-05-25 LAB — LIPASE: Lipase: 9 U/L — ABNORMAL LOW (ref 11.0–59.0)

## 2021-05-25 LAB — CBC WITH DIFFERENTIAL/PLATELET
Basophils Absolute: 0 10*3/uL (ref 0.0–0.1)
Basophils Relative: 0.8 % (ref 0.0–3.0)
Eosinophils Absolute: 0.1 10*3/uL (ref 0.0–0.7)
Eosinophils Relative: 1 % (ref 0.0–5.0)
HCT: 48.6 % (ref 39.0–52.0)
Hemoglobin: 16.4 g/dL (ref 13.0–17.0)
Lymphocytes Relative: 17.3 % (ref 12.0–46.0)
Lymphs Abs: 1 10*3/uL (ref 0.7–4.0)
MCHC: 33.8 g/dL (ref 30.0–36.0)
MCV: 93.5 fl (ref 78.0–100.0)
Monocytes Absolute: 0.7 10*3/uL (ref 0.1–1.0)
Monocytes Relative: 12.4 % — ABNORMAL HIGH (ref 3.0–12.0)
Neutro Abs: 4 10*3/uL (ref 1.4–7.7)
Neutrophils Relative %: 68.5 % (ref 43.0–77.0)
Platelets: 167 10*3/uL (ref 150.0–400.0)
RBC: 5.2 Mil/uL (ref 4.22–5.81)
RDW: 14.2 % (ref 11.5–15.5)
WBC: 5.8 10*3/uL (ref 4.0–10.5)

## 2021-05-25 LAB — AMYLASE: Amylase: 34 U/L (ref 27–131)

## 2021-05-25 LAB — TSH: TSH: 1.25 u[IU]/mL (ref 0.35–5.50)

## 2021-05-25 LAB — HIGH SENSITIVITY CRP: CRP, High Sensitivity: 3.87 mg/L (ref 0.000–5.000)

## 2021-05-25 MED ORDER — OMEPRAZOLE 40 MG PO CPDR: 40.0000 mg | DELAYED_RELEASE_CAPSULE | Freq: Every day | ORAL | 2 refills | Status: DC

## 2021-05-25 NOTE — Patient Instructions (Addendum)
Your provider has requested that you go to the basement level for lab work before leaving today. Press "B" on the elevator. The lab is located at the first door on the left as you exit the elevator.  After getting labs in the basement, please go to the x-ray department in the basement.   Please take your proton pump inhibitor medication 30 minutes to 1 hour before meals- this makes it more effective.  Avoid spicy and acidic foods Avoid fatty foods Limit your intake of coffee, tea, alcohol, and carbonated drinks Work to maintain a healthy weight Keep the head of the bed elevated at least 3 inches with blocks or a wedge pillow if you are having any nighttime symptoms Stay upright for 2 hours after eating Avoid meals and snacks three to four hours before bedtime  Please go to the ER if you have any severe AB pain, unable to hold down food/water, blood in stool or vomit, chest pain, shortness of breath, or any worsening symptoms.    Gastroesophageal Reflux Disease, Adult Gastroesophageal reflux (GER) happens when acid from the stomach flows up into the tube that connects the mouth and the stomach (esophagus). Normally, food travels down the esophagus and stays in the stomach to be digested. However, when a person has GER, food and stomach acid sometimes move back up into the esophagus. If this becomes a more serious problem, the person may be diagnosed with a disease called gastroesophageal reflux disease (GERD). GERD occurs when the reflux: Happens often. Causes frequent or severe symptoms. Causes problems such as damage to the esophagus. When stomach acid comes in contact with the esophagus, the acid may cause inflammation in the esophagus. Over time, GERD may create small holes (ulcers) in the lining of the esophagus. What are the causes? This condition is caused by a problem with the muscle between the esophagus and the stomach (lower esophageal sphincter, or LES). Normally, the LES muscle  closes after food passes through the esophagus to the stomach. When the LES is weakened or abnormal, it does not close properly, and that allows food and stomach acid to go back up into the esophagus. The LES can be weakened by certain dietary substances, medicines, and medical conditions, including: Tobacco use. Pregnancy. Having a hiatal hernia. Alcohol use. Certain foods and beverages, such as coffee, chocolate, onions, and peppermint. What increases the risk? You are more likely to develop this condition if you: Have an increased body weight. Have a connective tissue disorder. Take NSAIDs, such as ibuprofen. What are the signs or symptoms? Symptoms of this condition include: Heartburn. Difficult or painful swallowing and the feeling of having a lump in the throat. A bitter taste in the mouth. Bad breath and having a large amount of saliva. Having an upset or bloated stomach and belching. Chest pain. Different conditions can cause chest pain. Make sure you see your health care provider if you experience chest pain. Shortness of breath or wheezing. Ongoing (chronic) cough or a nighttime cough. Wearing away of tooth enamel. Weight loss. How is this diagnosed? This condition may be diagnosed based on a medical history and a physical exam. To determine if you have mild or severe GERD, your health care provider may also monitor how you respond to treatment. You may also have tests, including: A test to examine your stomach and esophagus with a small camera (endoscopy). A test that measures the acidity level in your esophagus. A test that measures how much pressure is on your  esophagus. A barium swallow or modified barium swallow test to show the shape, size, and functioning of your esophagus. How is this treated? Treatment for this condition may vary depending on how severe your symptoms are. Your health care provider may recommend: Changes to your diet. Medicine. Surgery. The goal  of treatment is to help relieve your symptoms and to prevent complications. Follow these instructions at home: Eating and drinking  Follow a diet as recommended by your health care provider. This may involve avoiding foods and drinks such as: Coffee and tea, with or without caffeine. Drinks that contain alcohol. Energy drinks and sports drinks. Carbonated drinks or sodas. Chocolate and cocoa. Peppermint and mint flavorings. Garlic and onions. Horseradish. Spicy and acidic foods, including peppers, chili powder, curry powder, vinegar, hot sauces, and barbecue sauce. Citrus fruit juices and citrus fruits, such as oranges, lemons, and limes. Tomato-based foods, such as red sauce, chili, salsa, and pizza with red sauce. Fried and fatty foods, such as donuts, french fries, potato chips, and high-fat dressings. High-fat meats, such as hot dogs and fatty cuts of red and white meats, such as rib eye steak, sausage, ham, and bacon. High-fat dairy items, such as whole milk, butter, and cream cheese. Eat small, frequent meals instead of large meals. Avoid drinking large amounts of liquid with your meals. Avoid eating meals during the 2-3 hours before bedtime. Avoid lying down right after you eat. Do not exercise right after you eat. Lifestyle  Do not use any products that contain nicotine or tobacco. These products include cigarettes, chewing tobacco, and vaping devices, such as e-cigarettes. If you need help quitting, ask your health care provider. Try to reduce your stress by using methods such as yoga or meditation. If you need help reducing stress, ask your health care provider. If you are overweight, reduce your weight to an amount that is healthy for you. Ask your health care provider for guidance about a safe weight loss goal. General instructions Pay attention to any changes in your symptoms. Take over-the-counter and prescription medicines only as told by your health care provider. Do  not take aspirin, ibuprofen, or other NSAIDs unless your health care provider told you to take these medicines. Wear loose-fitting clothing. Do not wear anything tight around your waist that causes pressure on your abdomen. Raise (elevate) the head of your bed about 6 inches (15 cm). You can use a wedge to do this. Avoid bending over if this makes your symptoms worse. Keep all follow-up visits. This is important. Contact a health care provider if: You have: New symptoms. Unexplained weight loss. Difficulty swallowing or it hurts to swallow. Wheezing or a persistent cough. A hoarse voice. Your symptoms do not improve with treatment. Get help right away if: You have sudden pain in your arms, neck, jaw, teeth, or back. You suddenly feel sweaty, dizzy, or light-headed. You have chest pain or shortness of breath. You vomit and the vomit is green, yellow, or black, or it looks like blood or coffee grounds. You faint. You have stool that is red, bloody, or black. You cannot swallow, drink, or eat. These symptoms may represent a serious problem that is an emergency. Do not wait to see if the symptoms will go away. Get medical help right away. Call your local emergency services (911 in the [place] Do not drive yourself to the hospital. Summary Gastroesophageal reflux happens when acid from the stomach flows up into the esophagus. GERD is a disease in which the reflux  happens often, causes frequent or severe symptoms, or causes problems such as damage to the esophagus. Treatment for this condition may vary depending on how severe your symptoms are. Your health care provider may recommend diet and lifestyle changes, medicine, or surgery. Contact a health care provider if you have new or worsening symptoms. Take over-the-counter and prescription medicines only as told by your health care provider. Do not take aspirin, ibuprofen, or other NSAIDs unless your health care provider told you to do so. Keep  all follow-up visits as told by your health care provider. This is important. This information is not intended to replace advice given to you by your health care provider. Make sure you discuss any questions you have with your health care provider. Document Revised: 07/05/2019 Document Reviewed: 07/05/2019 Elsevier Patient Education  2022 [place] providers would like to encourage you to use [facility] to communicate with providers for non-urgent requests or questions.  Due to long hold times on the telephone, sending your provider a message by [facility] may be a faster and more efficient way to get a response.  Please allow 48 business hours for a response.  Please remember that this is for non-urgent requests.   Due to recent changes in healthcare laws, you may see the results of your imaging and laboratory studies on MyChart before your provider has had a chance to review them.  We understand that in some cases there may be results that are confusing or concerning to you. Not all laboratory results come back in the same time frame and the provider may be waiting for multiple results in order to interpret others.  Please give [place] 48 hours in order for your provider to thoroughly review all the results before contacting the office for clarification of your results.

## 2021-06-05 ENCOUNTER — Ambulatory Visit (INDEPENDENT_AMBULATORY_CARE_PROVIDER_SITE_OTHER)
Admission: RE | Admit: 2021-06-05 | Discharge: 2021-06-05 | Disposition: A | Discharge status: Home / Self Care | Payer: Medicare Other | Source: Ambulatory Visit | Attending: Internal Medicine | Admitting: Internal Medicine

## 2021-06-05 DIAGNOSIS — R0602 Shortness of breath: Secondary | ICD-10-CM | POA: Diagnosis not present

## 2021-06-05 DIAGNOSIS — R911 Solitary pulmonary nodule: Secondary | ICD-10-CM | POA: Diagnosis not present

## 2021-06-05 DIAGNOSIS — Z122 Encounter for screening for malignant neoplasm of respiratory organs: Secondary | ICD-10-CM

## 2021-06-05 DIAGNOSIS — R599 Enlarged lymph nodes, unspecified: Secondary | ICD-10-CM | POA: Diagnosis not present

## 2021-06-05 DIAGNOSIS — J439 Emphysema, unspecified: Secondary | ICD-10-CM | POA: Diagnosis not present

## 2021-06-06 ENCOUNTER — Other Ambulatory Visit: Payer: Self-pay | Admitting: Internal Medicine

## 2021-06-07 ENCOUNTER — Other Ambulatory Visit: Payer: Self-pay | Admitting: Internal Medicine

## 2021-06-07 DIAGNOSIS — R911 Solitary pulmonary nodule: Secondary | ICD-10-CM

## 2021-06-07 DIAGNOSIS — R918 Other nonspecific abnormal finding of lung field: Secondary | ICD-10-CM

## 2021-06-18 DIAGNOSIS — E230 Hypopituitarism: Secondary | ICD-10-CM | POA: Diagnosis not present

## 2021-06-18 DIAGNOSIS — D352 Benign neoplasm of pituitary gland: Secondary | ICD-10-CM | POA: Diagnosis not present

## 2021-06-18 DIAGNOSIS — E221 Hyperprolactinemia: Secondary | ICD-10-CM | POA: Diagnosis not present

## 2021-06-19 ENCOUNTER — Encounter: Payer: Self-pay | Admitting: Emergency Medicine

## 2021-06-19 ENCOUNTER — Ambulatory Visit (INDEPENDENT_AMBULATORY_CARE_PROVIDER_SITE_OTHER): Payer: Medicare Other | Admitting: Emergency Medicine

## 2021-06-19 ENCOUNTER — Institutional Professional Consult (permissible substitution): Payer: Medicare Other | Admitting: Emergency Medicine

## 2021-06-19 VITALS — BP 114/72 | HR 61 | Temp 97.8°F | Ht 72.0 in | Wt 179.4 lb

## 2021-06-19 DIAGNOSIS — R918 Other nonspecific abnormal finding of lung field: Secondary | ICD-10-CM | POA: Diagnosis not present

## 2021-06-19 NOTE — Assessment & Plan Note (Addendum)
I reviewed his CT with him and have recommended navigational bronchoscopy, EBUS.  He understands.  He is okay with going ahead to schedule this but is going to discuss with family, wife.  He will call me to confirm that we are going forward as planned.  If he decides to defer then we will either get a PET scan now or repeat his CT chest in 3 months to follow for interval stability   We reviewed your CT scan of the chest today. We will arrange for bronchoscopy to be done as an outpatient under general anesthesia and [facility] endoscopy to evaluate your pulmonary nodules.  You will need to stop your aspirin 2 days prior.  You will need a designated driver and someone to watch you at home after anesthesia. Please call our office after discussing this procedure with family to confirm that we want to proceed as planned. If we decide to defer the bronchoscopy then we will determine which kind of scan to order to follow your pulmonary nodules. Follow with Dr Lamonte Sakai in 1 month

## 2021-06-19 NOTE — Patient Instructions (Addendum)
We reviewed your CT scan of the chest today. We will arrange for bronchoscopy to be done as an outpatient under general anesthesia and [facility] endoscopy to evaluate your pulmonary nodules.  You will need to stop your aspirin 2 days prior.  You will need a designated driver and someone to watch you at home after anesthesia. Please call our office after discussing this procedure with family to confirm that we want to proceed as planned. If we decide to defer the bronchoscopy then we will determine which kind of scan to order to follow your pulmonary nodules. Follow with Dr Lamonte Sakai in 1 month

## 2021-06-19 NOTE — Progress Notes (Signed)
Subjective:    Patient ID: Patrick Harris, male    DOB: Apr 25, 1945, 76 y.o.   MRN: [id number]  HPI 76 year old former smoker (37 pack years) with history of CAD/CABG, chronic CHF, COPD, hypertension, hypopituitarism.  He falls just outside the 15-year cutoff for lung cancer screening program but based on suspicion underwent a CT chest without contrast 06/05/2021 as below He is largely asymptomatic. He can sometimes evolve some L mid back pain w exertion. He has also noticed some R neck fullness and soreness for the last few weeks. He can sometimes hears some wheeze at night, coughs up some light mucous in am or through the day. No exertional SOB, although he has limited activity do to foot and hip pain. He can walk 200 yrds without SOB.   CT chest 06/05/2021 showed multiple pulmonary nodules including a 2.4 x 2.8 cm noncalcified spiculated anterior right upper lobe nodule, 2.5 x 1.1 cm medial right upper lobe nodule, 1.8 x 1.7 medial left upper lobe spiculated nodule, 1.2 x 0.7 posterior superior segmental right lower lobe pulmonary nodule.  There is severe centrilobular and panlobular emphysema with numerous blebs. 1.5cm subcarinal.    Review of Systems As per HPI  Past Medical History:  Diagnosis Date   Arthritis    CAD (coronary artery disease)    CABG with LIMA to LAD and RIMA to RCA August 2000 - Dr. Roxan Hockey   Cancer [facility])    basal cell   CHF (congestive heart failure) (HCC)    COPD (chronic obstructive pulmonary disease) (HCC)    Elevated glycated hemoglobin    Essential hypertension    GERD (gastroesophageal reflux disease)    Heart murmur    Hyperlipidemia    Hypopituitarism (HCC)    Osteopenia    Dr Wilson Singer   Pancreatitis    Peyronie disease    Testosterone deficiency    Dr Wilson Singer     Family History  Problem Relation Age of Onset   Heart attack Father 59   Diabetes Mother    Stroke Mother 48   Hypertension Mother    Leukemia Sister    Colon cancer Paternal  Uncle    Aortic aneurysm Brother        abdominal   Heart disease Brother    Atrial fibrillation Brother    Cancer Maternal Uncle         X3:prostate , renal, bone    Parkinson's disease Sister    Stroke Sister    Stroke Sister    Stroke Sister    Atrial fibrillation Sister    Esophageal cancer Neg Hx    Rectal cancer Neg Hx    Stomach cancer Neg Hx      Social History   Socioeconomic History   Marital status: Married    Spouse name: Not on file   Number of children: 1   Years of education: Not on file   Highest education level: Not on file  Occupational History   Occupation: retired  Tobacco Use   Smoking status: Some Days    Types: Cigarettes    Last attempt to quit: 01/07/1998    Years since quitting: 23.4   Smokeless tobacco: Never   Tobacco comments:    Currently smokes on an irregular basis, approx. 1-2 cigarettes/week  Vaping Use   Vaping Use: Never used  Substance and Sexual Activity   Alcohol use: No   Drug use: No   Sexual activity: Yes  Other Topics Concern  Not on file  Social History Narrative   Patients daughter passed away at 29 from Breast Cancer.       Right Handed    Lives in two story home    Social Determinants of Health   Financial Resource Strain: Low Risk  (09/18/2020)   Overall Financial Resource Strain (CARDIA)    Difficulty of Paying Living Expenses: Not hard at all  Food Insecurity: No Food Insecurity (09/18/2020)   Hunger Vital Sign    Worried About Running Out of Food in the Last Year: Never true    Ran Out of Food in the Last Year: Never true  Transportation Needs: No Transportation Needs (09/18/2020)   PRAPARE - Hydrologist (Medical): No    Lack of Transportation (Non-Medical): No  Physical Activity: Sufficiently Active (09/18/2020)   Exercise Vital Sign    Days of Exercise per Week: 5 days    Minutes of Exercise per Session: 30 min  Stress: No Stress Concern Present (09/18/2020)   [place]    Feeling of Stress : Not at all  Social Connections: [place] (09/18/2020)   Social Connection and Isolation Panel [NHANES]    Frequency of Communication with Friends and Family: More than three times a week    Frequency of Social Gatherings with Friends and Family: More than three times a week    Attends Religious Services: More than 4 times per year    Active Member of [employer] or Organizations: Yes    Attends Music therapist: More than 4 times per year    Marital Status: Married  Human resources officer Violence: Not At Risk (09/18/2020)   Humiliation, Afraid, Rape, and Kick questionnaire    Fear of Current or Ex-Partner: No    Emotionally Abused: No    Physically Abused: No    Sexually Abused: No    Has worked as an Restaurant manager, fast food From [employer]  Allergies  Allergen Reactions   Latex     Contact  dermatitis   Amlodipine Besylate     Leg edema   Lisinopril     Leg pain, inc HR, dizziness, headache   Amlodipine     Edema    Diovan [Valsartan] Nausea Only   Hydrochlorothiazide Other (See Comments)    Possibly caused pancreatitis 2018   Simvastatin     Joint pain     Outpatient Medications Prior to Visit  Medication Sig Dispense Refill   acetaminophen (TYLENOL) 500 MG tablet Take 500 mg by mouth every 6 (six) hours as needed.     aspirin EC 81 MG tablet Take 1 tablet (81 mg total) by mouth at bedtime.     Cholecalciferol (VITAMIN D3) 2000 UNITS TABS Take by mouth. 2000 iu in the am, 1000 iu in the pm     Coenzyme Q10 (COQ-10) 100 MG CAPS Take by mouth.     Cranberry 405 MG CAPS See admin instructions.     gabapentin (NEURONTIN) 300 MG capsule Take 1 tablet at bedtime and 2 tablets at bedtime. 90 capsule 5   Garlic 2409 MG CAPS Take by mouth.     hydrALAZINE (APRESOLINE) 25 MG tablet Take 1 tablet (25 mg total) by mouth 3 (three) times daily. 270 tablet 3   irbesartan (AVAPRO) 300 MG  tablet TAKE 1 TABLET BY MOUTH EVERY DAY 30 tablet 1   Magnesium 250 MG TABS Take by mouth.  Menaquinone-7 (VITAMIN K2) 100 MCG CAPS Take by mouth.     nitroGLYCERIN (NITROSTAT) 0.4 MG SL tablet Place 1 tablet (0.4 mg total) under the tongue every 5 (five) minutes x 3 doses as needed for chest pain (if no relief after 2nd dose, proceed to ED or call 911). 25 tablet 3   Omega-3 Fatty Acids (OMEGA-3 FISH OIL PO) Take 900 mg by mouth.     omeprazole (PRILOSEC) 40 MG capsule Take 1 capsule (40 mg total) by mouth daily before breakfast. 60 capsule 2   pyrithione zinc (SELSUN BLUE DRY SCALP) 1 % shampoo 1 application to scalp     rosuvastatin (CRESTOR) 20 MG tablet TAKE HALF A TABLET (10 MG) BY MOUTH ONCE DAILY. 45 tablet 3   tadalafil (CIALIS) 5 MG tablet Take 1 tablet by mouth daily.     tamsulosin (FLOMAX) 0.4 MG CAPS capsule Take 0.4 mg by mouth 2 (two) times daily.     Testosterone 20.25 MG/ACT (1.62%) GEL SMARTSIG:2 Pump Topical Daily     vitamin C (ASCORBIC ACID) 500 MG tablet Take 500 mg by mouth 2 (two) times daily.     bromocriptine (PARLODEL) 5 MG capsule Take 15 mg by mouth daily. (Patient not taking: Reported on 06/19/2021)     No facility-administered medications prior to visit.         Objective:   Physical Exam  Vitals:   06/19/21 1338  BP: 114/72  Pulse: 61  Temp: 97.8 F (36.6 C)  TempSrc: Oral  SpO2: 96%  Weight: 179 lb 6.4 oz (81.4 kg)  Height: 6' (1.829 m)    Gen: Pleasant, well-nourished, in no distress,  normal affect  ENT: No lesions,  mouth clear,  oropharynx clear, no postnasal drip  Neck: No JVD, no stridor  Lungs: No use of accessory muscles, no crackles or wheezing on normal respiration, no wheeze on forced expiration  Cardiovascular: RRR, heart sounds normal, no murmur or gallops, no peripheral edema  Musculoskeletal: No deformities, no cyanosis or clubbing  Neuro: alert, awake, non focal  Skin: Warm, no lesions or rash     Assessment &  Plan:  Pulmonary nodules I reviewed his CT with him and have recommended navigational bronchoscopy, EBUS.  He understands.  He is okay with going ahead to schedule this but is going to discuss with family, wife.  He will call me to confirm that we are going forward as planned.  If he decides to defer then we will either get a PET scan now or repeat his CT chest in 3 months to follow for interval stability   We reviewed your CT scan of the chest today. We will arrange for bronchoscopy to be done as an outpatient under general anesthesia and [facility] endoscopy to evaluate your pulmonary nodules.  You will need to stop your aspirin 2 days prior.  You will need a designated driver and someone to watch you at home after anesthesia. Please call our office after discussing this procedure with family to confirm that we want to proceed as planned. If we decide to defer the bronchoscopy then we will determine which kind of scan to order to follow your pulmonary nodules. Follow with Dr Lamonte Sakai in 1 month   Baltazar Apo, MD, PhD 06/19/2021, 2:12 PM [facility] [phone] or if no answer before 7:00PM call [phone] For any issues after 7:00PM please call eLink [phone]

## 2021-06-25 DIAGNOSIS — E230 Hypopituitarism: Secondary | ICD-10-CM | POA: Diagnosis not present

## 2021-06-25 DIAGNOSIS — E221 Hyperprolactinemia: Secondary | ICD-10-CM | POA: Diagnosis not present

## 2021-06-25 DIAGNOSIS — D352 Benign neoplasm of pituitary gland: Secondary | ICD-10-CM | POA: Diagnosis not present

## 2021-06-25 DIAGNOSIS — R110 Nausea: Secondary | ICD-10-CM | POA: Diagnosis not present

## 2021-06-29 ENCOUNTER — Other Ambulatory Visit: Payer: Self-pay | Admitting: Emergency Medicine

## 2021-06-29 ENCOUNTER — Encounter (HOSPITAL_COMMUNITY): Payer: Self-pay | Admitting: Emergency Medicine

## 2021-06-29 LAB — SARS CORONAVIRUS 2 (TAT 6-24 HRS): SARS Coronavirus 2: NEGATIVE

## 2021-06-29 NOTE — Progress Notes (Signed)
Anesthesia Chart Review: Same day workup  Follows cardiology for history of multivessel CAD s/p CABG 2000.  Follow-up ischemic testing in 2020 was low risk.  Last seen by cardiologist Dr. Diona Browner 02/07/2021 and at that time doing well from cardiac standpoint, denied any active angina or nitroglycerin use.  He is not on beta-blocker due to resting bradycardia.  No changes made to management.  1 year follow-up recommended.  Former smoker with history of COPD.  Recently found to have multiple pulmonary nodules on CT.  Per Dr. Kavin Leech note 06/19/2021, "CT chest 06/05/2021 showed multiple pulmonary nodules including a 2.4 x 2.8 cm noncalcified spiculated anterior right upper lobe nodule, 2.5 x 1.1 cm medial right upper lobe nodule, 1.8 x 1.7 medial left upper lobe spiculated nodule, 1.2 x 0.7 posterior superior segmental right lower lobe pulmonary nodule.  There is severe centrilobular and panlobular emphysema with numerous blebs. 1.5cm subcarinal. "  Preop labs reviewed, unremarkable.  Patient will need day of surgery evaluation.  EKG 02/07/2021: Sinus bradycardia.  Rate 55.  Lexiscan Myoview 01/15/2018: There was no ST segment deviation noted during stress. Defect 1: There is a medium defect of moderate severity present in the mid inferoseptal, mid inferior and apical inferior location. Findings consistent with probable scar with some degree of soft tissue attenuation contributing to the defects. This is a low risk study overall. No ischemic zones. Nuclear stress EF: 48%.   Echocardiogram 01/15/2018: Study Conclusions   - Left ventricle: The cavity size was normal. Wall thickness was   increased in a pattern of mild LVH. Systolic function was normal.   The estimated ejection fraction was in the range of 55% to 60%.   Wall motion was normal; there were no regional wall motion   abnormalities. Features are consistent with a pseudonormal left   ventricular filling pattern, with concomitant abnormal  relaxation   and increased filling pressure (grade 2 diastolic dysfunction).   Doppler parameters are consistent with indeterminate ventricular   filling pressure. - Aortic valve: Mildly to moderately calcified annulus. Mildly   thickened, mildly calcified leaflets. Sclerosis without stenosis. - Left atrium: The atrium was mildly dilated. - Right ventricle: Systolic function was mildly reduced. - Systemic veins: The IVC is dilated with normal respiratory   variation. Estimated right atrial pressure is 8 mmHg.    Zannie Cove [facility] Short Stay Center/Anesthesiology Phone [phone] 06/29/2021 11:09 AM

## 2021-06-30 ENCOUNTER — Other Ambulatory Visit: Payer: Self-pay | Admitting: Internal Medicine

## 2021-07-02 ENCOUNTER — Ambulatory Visit (HOSPITAL_COMMUNITY): Payer: Medicare Other | Admitting: Physician Assistant

## 2021-07-02 ENCOUNTER — Ambulatory Visit (HOSPITAL_COMMUNITY): Payer: Medicare Other

## 2021-07-02 ENCOUNTER — Ambulatory Visit (HOSPITAL_BASED_OUTPATIENT_CLINIC_OR_DEPARTMENT_OTHER): Payer: Medicare Other | Admitting: Physician Assistant

## 2021-07-02 ENCOUNTER — Encounter (HOSPITAL_COMMUNITY): Payer: Self-pay | Admitting: Emergency Medicine

## 2021-07-02 ENCOUNTER — Ambulatory Visit (HOSPITAL_COMMUNITY)
Admission: RE | Admit: 2021-07-02 | Discharge: 2021-07-02 | Disposition: A | Discharge status: Home / Self Care | Payer: Medicare Other | Attending: Emergency Medicine | Admitting: Emergency Medicine

## 2021-07-02 ENCOUNTER — Other Ambulatory Visit: Payer: Self-pay

## 2021-07-02 ENCOUNTER — Encounter (HOSPITAL_COMMUNITY)
Admission: RE | Disposition: A | Discharge status: Home / Self Care | Payer: Self-pay | Source: Home / Self Care | Attending: Emergency Medicine

## 2021-07-02 DIAGNOSIS — R918 Other nonspecific abnormal finding of lung field: Secondary | ICD-10-CM | POA: Diagnosis present

## 2021-07-02 DIAGNOSIS — K219 Gastro-esophageal reflux disease without esophagitis: Secondary | ICD-10-CM | POA: Insufficient documentation

## 2021-07-02 DIAGNOSIS — I2510 Atherosclerotic heart disease of native coronary artery without angina pectoris: Secondary | ICD-10-CM | POA: Diagnosis not present

## 2021-07-02 DIAGNOSIS — C3491 Malignant neoplasm of unspecified part of right bronchus or lung: Secondary | ICD-10-CM | POA: Diagnosis not present

## 2021-07-02 DIAGNOSIS — I10 Essential (primary) hypertension: Secondary | ICD-10-CM

## 2021-07-02 DIAGNOSIS — J449 Chronic obstructive pulmonary disease, unspecified: Secondary | ICD-10-CM | POA: Diagnosis not present

## 2021-07-02 DIAGNOSIS — R590 Localized enlarged lymph nodes: Secondary | ICD-10-CM | POA: Insufficient documentation

## 2021-07-02 DIAGNOSIS — Z951 Presence of aortocoronary bypass graft: Secondary | ICD-10-CM | POA: Insufficient documentation

## 2021-07-02 DIAGNOSIS — Z87891 Personal history of nicotine dependence: Secondary | ICD-10-CM | POA: Insufficient documentation

## 2021-07-02 DIAGNOSIS — I509 Heart failure, unspecified: Secondary | ICD-10-CM | POA: Insufficient documentation

## 2021-07-02 DIAGNOSIS — E230 Hypopituitarism: Secondary | ICD-10-CM | POA: Insufficient documentation

## 2021-07-02 DIAGNOSIS — C3411 Malignant neoplasm of upper lobe, right bronchus or lung: Secondary | ICD-10-CM | POA: Diagnosis not present

## 2021-07-02 DIAGNOSIS — C3492 Malignant neoplasm of unspecified part of left bronchus or lung: Secondary | ICD-10-CM | POA: Diagnosis not present

## 2021-07-02 DIAGNOSIS — C3412 Malignant neoplasm of upper lobe, left bronchus or lung: Secondary | ICD-10-CM | POA: Diagnosis not present

## 2021-07-02 DIAGNOSIS — I110 Hypertensive heart disease with heart failure: Secondary | ICD-10-CM | POA: Diagnosis not present

## 2021-07-02 DIAGNOSIS — I739 Peripheral vascular disease, unspecified: Secondary | ICD-10-CM | POA: Diagnosis not present

## 2021-07-02 DIAGNOSIS — R846 Abnormal cytological findings in specimens from respiratory organs and thorax: Secondary | ICD-10-CM | POA: Diagnosis not present

## 2021-07-02 HISTORY — PX: VIDEO BRONCHOSCOPY WITH RADIAL ENDOBRONCHIAL ULTRASOUND: SHX6849

## 2021-07-02 HISTORY — PX: BRONCHIAL BRUSHINGS: SHX5108

## 2021-07-02 HISTORY — PX: BRONCHIAL NEEDLE ASPIRATION BIOPSY: SHX5106

## 2021-07-02 HISTORY — PX: FIDUCIAL MARKER PLACEMENT: SHX6858

## 2021-07-02 HISTORY — PX: BRONCHIAL BIOPSY: SHX5109

## 2021-07-02 LAB — CBC
HCT: 51.3 % (ref 39.0–52.0)
Hemoglobin: 17.3 g/dL — ABNORMAL HIGH (ref 13.0–17.0)
MCH: 32 pg (ref 26.0–34.0)
MCHC: 33.7 g/dL (ref 30.0–36.0)
MCV: 95 fL (ref 80.0–100.0)
Platelets: 162 10*3/uL (ref 150–400)
RBC: 5.4 MIL/uL (ref 4.22–5.81)
RDW: 13.7 % (ref 11.5–15.5)
WBC: 7.1 10*3/uL (ref 4.0–10.5)
nRBC: 0 % (ref 0.0–0.2)

## 2021-07-02 LAB — BASIC METABOLIC PANEL
Anion gap: 11 (ref 5–15)
BUN: 17 mg/dL (ref 8–23)
CO2: 22 mmol/L (ref 22–32)
Calcium: 9 mg/dL (ref 8.9–10.3)
Chloride: 103 mmol/L (ref 98–111)
Creatinine, Ser: 0.98 mg/dL (ref 0.61–1.24)
GFR, Estimated: >60 mL/min (ref >60)
Glucose, Bld: 100 mg/dL — ABNORMAL HIGH (ref 70–99)
Potassium: 4.3 mmol/L (ref 3.5–5.1)
Sodium: 136 mmol/L (ref 135–145)

## 2021-07-02 SURGERY — BRONCHOSCOPY, WITH BIOPSY USING ELECTROMAGNETIC NAVIGATION
Anesthesia: General | Laterality: Bilateral

## 2021-07-02 MED ORDER — SUGAMMADEX SODIUM 200 MG/2ML IV SOLN
INTRAVENOUS | Status: DC | PRN
  Administered 2021-07-02: 200 mg via INTRAVENOUS

## 2021-07-02 MED ORDER — FENTANYL CITRATE (PF) 100 MCG/2ML IJ SOLN
INTRAMUSCULAR | Status: AC
  Filled 2021-07-02: qty 2

## 2021-07-02 MED ORDER — ONDANSETRON HCL 4 MG/2ML IJ SOLN
INTRAMUSCULAR | Status: DC | PRN
  Administered 2021-07-02: 4 mg via INTRAVENOUS

## 2021-07-02 MED ORDER — FENTANYL CITRATE (PF) 250 MCG/5ML IJ SOLN
INTRAMUSCULAR | Status: DC | PRN
Start: 2021-07-02 — End: 2021-07-02
  Administered 2021-07-02 (×2): 50 ug via INTRAVENOUS

## 2021-07-02 MED ORDER — LIDOCAINE 2% (20 MG/ML) 5 ML SYRINGE
INTRAMUSCULAR | Status: DC | PRN
  Administered 2021-07-02: 60 mg via INTRAVENOUS

## 2021-07-02 MED ORDER — ROCURONIUM BROMIDE 10 MG/ML (PF) SYRINGE
PREFILLED_SYRINGE | INTRAVENOUS | Status: DC | PRN
  Administered 2021-07-02: 20 mg via INTRAVENOUS
  Administered 2021-07-02: 60 mg via INTRAVENOUS

## 2021-07-02 MED ORDER — PHENYLEPHRINE HCL-NACL 20-0.9 MG/250ML-% IV SOLN
INTRAVENOUS | Status: DC | PRN
  Administered 2021-07-02: 20 ug/min via INTRAVENOUS

## 2021-07-02 MED ORDER — ROSUVASTATIN CALCIUM 20 MG PO TABS: 10.0000 mg | ORAL_TABLET | Freq: Every evening | ORAL | Status: DC

## 2021-07-02 MED ORDER — PROPOFOL 10 MG/ML IV BOLUS
INTRAVENOUS | Status: DC | PRN
  Administered 2021-07-02: 130 mg via INTRAVENOUS

## 2021-07-02 MED ORDER — ACETAMINOPHEN 500 MG PO TABS
1000.0000 mg | ORAL_TABLET | Freq: Once | ORAL | Status: AC
  Administered 2021-07-02: 1000 mg via ORAL
  Filled 2021-07-02: qty 2

## 2021-07-02 MED ORDER — EPHEDRINE SULFATE-NACL 50-0.9 MG/10ML-% IV SOSY
PREFILLED_SYRINGE | INTRAVENOUS | Status: DC | PRN
  Administered 2021-07-02 (×2): 5 mg via INTRAVENOUS

## 2021-07-02 MED ORDER — ASPIRIN EC 81 MG PO TBEC: 81.0000 mg | DELAYED_RELEASE_TABLET | Freq: Every day | ORAL | 11 refills | Status: AC

## 2021-07-02 MED ORDER — DEXAMETHASONE SODIUM PHOSPHATE 10 MG/ML IJ SOLN
INTRAMUSCULAR | Status: DC | PRN
  Administered 2021-07-02: 10 mg via INTRAVENOUS

## 2021-07-02 MED ORDER — CHLORHEXIDINE GLUCONATE 0.12 % MT SOLN
OROMUCOSAL | Status: AC
  Administered 2021-07-02: 15 mL
  Filled 2021-07-02: qty 15

## 2021-07-02 MED ORDER — LACTATED RINGERS IV SOLN: INTRAVENOUS | Status: DC

## 2021-07-02 SURGICAL SUPPLY — 1 items: fiducial ×2 IMPLANT

## 2021-07-02 NOTE — Transfer of Care (Signed)
Immediate Anesthesia Transfer of Care Note  Patient: Patrick Harris  Procedure(s) Performed: ROBOTIC ASSISTED NAVIGATIONAL BRONCHOSCOPY (Bilateral) VIDEO BRONCHOSCOPY WITH RADIAL ENDOBRONCHIAL ULTRASOUND BRONCHIAL BRUSHINGS BRONCHIAL NEEDLE ASPIRATION BIOPSIES BRONCHIAL BIOPSIES FIDUCIAL MARKER PLACEMENT  Patient Location: Endoscopy Unit  Anesthesia Type:General  Level of Consciousness: drowsy and patient cooperative  Airway & Oxygen Therapy: Patient Spontanous Breathing  Post-op Assessment: Report given to RN and Post -op Vital signs reviewed and stable  Post vital signs: Reviewed and stable  Last Vitals:  Vitals Value Taken Time  BP    Temp    Pulse 77 07/02/21 1201  Resp 15 07/02/21 1202  SpO2 94 % 07/02/21 1201  Vitals shown include unvalidated device data.  Last Pain:  Vitals:   07/02/21 0714  TempSrc:   PainSc: 0-No pain         Complications: No notable events documented.

## 2021-07-02 NOTE — Op Note (Signed)
Video Bronchoscopy with Robotic Assisted Bronchoscopic Navigation   Date of Operation: 07/02/2021   Pre-op Diagnosis: Bilateral pulmonary nodules, mediastinal adenopathy  Post-op Diagnosis: Same  Surgeon: Levy Pupa  Assistants: None  Anesthesia: General endotracheal anesthesia  Operation: Flexible video fiberoptic bronchoscopy with robotic assistance and biopsies.  Estimated Blood Loss: Minimal  Complications: None  Indications and History: Patrick Harris is a 76 y.o. male with history of tobacco use.  Found to have multiple pulmonary nodules and a small precarinal enlarged node on CT chest.  Recommendation made to achieve a tissue diagnosis via robotic assisted navigational bronchoscopy. The risks, benefits, complications, treatment options and expected outcomes were discussed with the patient.  The possibilities of pneumothorax, pneumonia, reaction to medication, pulmonary aspiration, perforation of a viscus, bleeding, failure to diagnose a condition and creating a complication requiring transfusion or operation were discussed with the patient who freely signed the consent.    Description of Procedure: The patient was seen in the Preoperative Area, was examined and was deemed appropriate to proceed.  The patient was taken to [facility] endoscopy room 3, identified as Patrick Harris and the procedure verified as Flexible Video Fiberoptic Bronchoscopy.  A Time Out was held and the above information confirmed.   Prior to the date of the procedure a high-resolution CT scan of the chest was performed. Utilizing ION software program a virtual tracheobronchial tree was generated to allow the creation of distinct navigation pathways to the patient's parenchymal abnormalities. After being taken to the operating room general anesthesia was initiated and the patient  was orally intubated. The video fiberoptic bronchoscope was introduced via the endotracheal tube and a general inspection was  performed which showed normal right and left lung anatomy. Aspiration of the bilateral mainstems was completed to remove any remaining secretions. Robotic catheter inserted into patient's endotracheal tube.   Target #1 right upper lobe pulmonary nodule: The distinct navigation pathways prepared prior to this procedure were then utilized to navigate to patient's lesion identified on CT scan. The robotic catheter was secured into place and the vision probe was withdrawn.  Lesion location was approximated using fluoroscopy and radial endobronchial ultrasound for peripheral targeting. Under fluoroscopic guidance transbronchial needle brushings, transbronchial needle biopsies, and transbronchial forceps biopsies were performed to be sent for cytology and pathology.  Under fluoroscopic guidance a single fiducial marker was placed adjacent to the nodule  Target #2 left upper lobe pulmonary nodule: The distinct navigation pathways prepared prior to this procedure were then utilized to navigate to patient's lesion identified on CT scan. The robotic catheter was secured into place and the vision probe was withdrawn.  Lesion location was approximated using fluoroscopy and radial endobronchial ultrasound for peripheral targeting.  Local registration and targeting was compost using Cios three-dimensional imaging.  Under fluoroscopic guidance transbronchial needle brushings, transbronchial needle biopsies, and transbronchial forceps biopsies were performed to be sent for cytology and pathology.  Under fluoroscopic guidance a single fiducial marker was placed adjacent to the nodule.  At the end of the procedure a general airway inspection was performed and there was no evidence of active bleeding. The bronchoscope was removed.  The patient tolerated the procedure well. There was no significant blood loss and there were no obvious complications. A post-procedural chest x-ray is pending.  Samples Target #1: 1.  Transbronchial needle brushings from right upper lobe nodule 2. Transbronchial Wang needle biopsies from right upper lobe nodule 3. Transbronchial forceps biopsies from right upper lobe nodule  Samples Target #2:  1. Transbronchial needle brushings from left upper lobe nodule 2. Transbronchial Wang needle biopsies from left upper lobe nodule 3. Transbronchial forceps biopsies from left upper lobe nodule   Plans:  The patient will be discharged from the PACU to home when recovered from anesthesia and after chest x-ray is reviewed. We will review the cytology, pathology and microbiology results with the patient when they become available. Outpatient followup will be with Dr. Delton Coombes.  Levy Pupa, MD, PhD 07/02/2021, 11:49 AM [facility] [phone] or if no answer before 7:00PM call [phone] For any issues after 7:00PM please call eLink [phone]

## 2021-07-03 ENCOUNTER — Encounter (HOSPITAL_COMMUNITY): Payer: Self-pay | Admitting: Emergency Medicine

## 2021-07-03 ENCOUNTER — Institutional Professional Consult (permissible substitution): Payer: Medicare Other | Admitting: Emergency Medicine

## 2021-07-05 ENCOUNTER — Telehealth: Payer: Self-pay | Admitting: Emergency Medicine

## 2021-07-05 DIAGNOSIS — C3490 Malignant neoplasm of unspecified part of unspecified bronchus or lung: Secondary | ICD-10-CM

## 2021-07-05 LAB — CYTOLOGY - NON PAP

## 2021-07-05 NOTE — Telephone Encounter (Signed)
Reviewed pathology results with the patient's wife by phone. Left upper lobe biopsies had limited cellularity but were consistent with non-small cell Lung cancer probably adenocarcinoma but immunohistochemistry could not be performed.  The right upper lobe biopsies were consistent with squamous cell lung cancer.  Question whether this is single process or 2 primaries.  He has other smaller nodules bilaterally.  He wants to be seen at [facility] oncology, asked to be referred to see Dr Carlye Grippe.  I will make this referral. We will order an MRI brain, PET scan to be done ASAP.

## 2021-07-05 NOTE — Telephone Encounter (Signed)
Patient's right upper lobe cytology is available, consistent with non-small cell lung cancer, not yet further subtyped.  The left upper lobe information is not yet available.  I will call him when the pathology is completed.

## 2021-07-05 NOTE — Telephone Encounter (Signed)
Patient is checking on Bronch procedure results. Patient phone number is [phone].

## 2021-07-17 ENCOUNTER — Encounter: Payer: Self-pay | Admitting: Gastroenterology

## 2021-07-17 ENCOUNTER — Ambulatory Visit: Payer: Medicare Other | Admitting: Gastroenterology

## 2021-07-17 VITALS — BP 132/78 | HR 82 | Ht 72.0 in | Wt 180.0 lb

## 2021-07-17 DIAGNOSIS — R1084 Generalized abdominal pain: Secondary | ICD-10-CM

## 2021-07-17 DIAGNOSIS — K219 Gastro-esophageal reflux disease without esophagitis: Secondary | ICD-10-CM

## 2021-07-17 MED ORDER — OMEPRAZOLE 40 MG PO CPDR: 40.0000 mg | DELAYED_RELEASE_CAPSULE | Freq: Two times a day (BID) | ORAL | 11 refills | Status: DC

## 2021-07-17 NOTE — Patient Instructions (Signed)
We have sent the following medications to your pharmacy for you to pick up at your convenience: omeprazole 40 mg twice daily.   Patient advised to avoid spicy, acidic, citrus, chocolate, mints, fruit and fruit juices.  Limit the intake of caffeine, alcohol and Soda.  Don't exercise too soon after eating.  Don't lie down within 3-4 hours of eating.  Elevate the head of your bed.  Call our office in 2-3 weeks if your symptoms are not better.   The [facility] providers would like to encourage you to use [facility] to communicate with providers for non-urgent requests or questions.  Due to long hold times on the telephone, sending your provider a message by [facility] may be a faster and more efficient way to get a response.  Please allow 48 business hours for a response.  Please remember that this is for non-urgent requests.   Due to recent changes in healthcare laws, you may see the results of your imaging and laboratory studies on MyChart before your provider has had a chance to review them.  We understand that in some cases there may be results that are confusing or concerning to you. Not all laboratory results come back in the same time frame and the provider may be waiting for multiple results in order to interpret others.  Please give [place] 48 hours in order for your provider to thoroughly review all the results before contacting the office for clarification of your results.   Thank you for choosing me and [facility].  Pricilla Riffle. Dagoberto Ligas., MD., Marval Regal

## 2021-07-17 NOTE — Progress Notes (Signed)
    Assessment     GERD Epigastric abdominal pain  Personal history of adenomatous colon polyps   Recommendations    Increase omeprazole to 40 mg p.o. twice daily taken 30 minutes before breakfast and dinner Call if reflux symptoms or epigastric pain are not improving over next 2-3 weeks Given recent diagnosis of non-small cell lung cancer will defer surveillance colonoscopy for now He is advised to contact his endocrinologist regarding bromocriptine side effect and management of pituitary insufficiency. REV in 2 months.   HPI    This is a 76 year old male with GERD and epigastric abdominal pain.  He notes his abdominal pain and nausea substantially improved since discontinuing bromocriptine.  His reflux symptoms are frequently active, particularly in the evenings. Blood work was normal. KUB unremarkable.  He was recently diagnosed with non-small cell lung carcinoma with features favoring adenocarcinoma.  He has an appointment with an oncologist at [facility] on July 24.  PET scan is scheduled.   EGD March 2021 - Normal esophagus. - Erythematous mucosa in the gastric body and antrum. Biopsied. Mild chronic inflammation - Small hiatal hernia. - Normal duodenal bulb and second portion of the duodenum.   Labs / Imaging       Latest Ref Rng & Units 05/25/2021    9:28 AM 08/29/2020    8:58 AM 03/27/2020   11:15 AM  Hepatic Function  Total Protein 6.0 - 8.3 g/dL 7.2  7.3  7.2   Albumin 3.5 - 5.2 g/dL 4.0  4.2  4.4   AST 0 - 37 U/L _0 ALT 0 - 53 U/L _1 Alk Phosphatase 39 - 117 U/L 59  47  57   Total Bilirubin 0.2 - 1.2 mg/dL 0.8  0.7  0.5        Latest Ref Rng & Units 07/02/2021    7:19 AM 05/25/2021    9:28 AM 08/29/2020    8:58 AM  CBC  WBC 4.0 - 10.5 K/uL 7.1  5.8  4.6   Hemoglobin 13.0 - 17.0 g/dL 17.3  16.4  17.2   Hematocrit 39.0 - 52.0 % 51.3  48.6  51.6   Platelets 150 - 400 K/uL 162  167.0  158.0     Current Medications, Allergies, Past Medical  History, Past Surgical History, Family History and Social History were reviewed in [employer] record.   Physical Exam: General: Well developed, well nourished, no acute distress Head: Normocephalic and atraumatic Eyes: Sclerae anicteric, EOMI Ears: Normal auditory acuity Mouth: Not examined Lungs: Clear throughout to auscultation Heart: Regular rate and rhythm; no murmurs, rubs or bruits Abdomen: Soft, epigastric tenderness to light palpation and non distended. No masses, hepatosplenomegaly or hernias noted. Normal Bowel sounds Rectal: Not done Musculoskeletal: Symmetrical with no gross deformities  Pulses:  Normal pulses noted Extremities: No clubbing, cyanosis, edema or deformities noted Neurological: Alert oriented x 4, grossly nonfocal Psychological:  Alert and cooperative. Normal mood and affect   Sparsh Callens T. Fuller Plan, MD 07/17/2021, 8:28 AM

## 2021-07-23 ENCOUNTER — Encounter (HOSPITAL_COMMUNITY)
Admission: RE | Admit: 2021-07-23 | Discharge: 2021-07-23 | Disposition: A | Discharge status: Home / Self Care | Payer: Medicare Other | Source: Ambulatory Visit | Attending: Emergency Medicine | Admitting: Emergency Medicine

## 2021-07-23 ENCOUNTER — Ambulatory Visit (HOSPITAL_COMMUNITY)
Admission: RE | Admit: 2021-07-23 | Discharge: 2021-07-23 | Disposition: A | Discharge status: Home / Self Care | Payer: Medicare Other | Source: Ambulatory Visit | Attending: Emergency Medicine | Admitting: Emergency Medicine

## 2021-07-23 DIAGNOSIS — C3490 Malignant neoplasm of unspecified part of unspecified bronchus or lung: Secondary | ICD-10-CM | POA: Diagnosis not present

## 2021-07-23 DIAGNOSIS — J432 Centrilobular emphysema: Secondary | ICD-10-CM | POA: Diagnosis not present

## 2021-07-23 LAB — GLUCOSE, CAPILLARY: Glucose-Capillary: 117 mg/dL — ABNORMAL HIGH (ref 70–99)

## 2021-07-23 MED ORDER — GADOBUTROL 1 MMOL/ML IV SOLN
8.0000 mL | Freq: Once | INTRAVENOUS | Status: AC | PRN
Start: 2021-07-23 — End: 2021-07-23
  Administered 2021-07-23: 8 mL via INTRAVENOUS

## 2021-07-23 MED ORDER — FLUDEOXYGLUCOSE F - 18 (FDG) INJECTION
9.0000 | Freq: Once | INTRAVENOUS | Status: AC
  Administered 2021-07-23: 9 via INTRAVENOUS

## 2021-07-25 ENCOUNTER — Encounter: Payer: Self-pay | Admitting: Emergency Medicine

## 2021-07-25 ENCOUNTER — Ambulatory Visit: Payer: Medicare Other | Admitting: Emergency Medicine

## 2021-07-25 DIAGNOSIS — R918 Other nonspecific abnormal finding of lung field: Secondary | ICD-10-CM

## 2021-07-25 NOTE — Progress Notes (Signed)
Subjective:    Patient ID: Patrick Harris, male    DOB: Jan 05, 1946, 76 y.o.   MRN: [id number]  HPI 76 year old former smoker (37 pack years) with history of CAD/CABG, chronic CHF, COPD, hypertension, hypopituitarism.  He falls just outside the 15-year cutoff for lung cancer screening program but based on suspicion underwent a CT chest without contrast 06/05/2021 as below He is largely asymptomatic. He can sometimes evolve some L mid back pain w exertion. He has also noticed some R neck fullness and soreness for the last few weeks. He can sometimes hears some wheeze at night, coughs up some light mucous in am or through the day. No exertional SOB, although he has limited activity do to foot and hip pain. He can walk 200 yrds without SOB.   CT chest 06/05/2021 showed multiple pulmonary nodules including a 2.4 x 2.8 cm noncalcified spiculated anterior right upper lobe nodule, 2.5 x 1.1 cm medial right upper lobe nodule, 1.8 x 1.7 medial left upper lobe spiculated nodule, 1.2 x 0.7 posterior superior segmental right lower lobe pulmonary nodule.  There is severe centrilobular and panlobular emphysema with numerous blebs. 1.5cm subcarinal.    ROV 07/25/21 --76 year old former smoker with COPD.  I saw him in June for an abnormal CT scan of the chest with multiple bilateral pulmonary nodules.  He underwent navigational bronchoscopy 07/02/2021.  Left upper lobe biopsies were limited cellularity but showed non-small cell lung cancer suggestive of adenocarcinoma.  The right upper lobe biopsies were consistent with squamous cell.  Unclear whether this is a single process or 2 primaries.  He underwent PET scan and MRI brain as below.  PET scan 07/23/2021 reviewed by me shows 2 hypermetabolic right upper lobe nodules, a single hypermetabolic left upper lobe nodule, hypermetabolic right hilar lymphadenopathy.  Subpleural nodule in the superior segment of the right lower lobe is not hypermetabolic.  No evidence of  distant metastatic disease but there is some focal hypermetabolic uptake in the prostate.  MRI brain 07/23/2021 reviewed by me shows no evidence of intracranial metastatic disease.   Review of Systems As per HPI  Past Medical History:  Diagnosis Date   Arthritis    CAD (coronary artery disease)    CABG with LIMA to LAD and RIMA to RCA August 2000 - Dr. Roxan Hockey   Cancer [facility])    basal cell   CHF (congestive heart failure) (HCC)    COPD (chronic obstructive pulmonary disease) (HCC)    Elevated glycated hemoglobin    Essential hypertension    GERD (gastroesophageal reflux disease)    Heart murmur    Hyperlipidemia    Hypopituitarism (HCC)    Osteopenia    Dr Wilson Singer   Pancreatitis    Peyronie disease    Testosterone deficiency    Dr Wilson Singer     Family History  Problem Relation Age of Onset   Heart attack Father 30   Diabetes Mother    Stroke Mother 63   Hypertension Mother    Leukemia Sister    Colon cancer Paternal Uncle    Aortic aneurysm Brother        abdominal   Heart disease Brother    Atrial fibrillation Brother    Cancer Maternal Uncle         X3:prostate , renal, bone    Parkinson's disease Sister    Stroke Sister    Stroke Sister    Stroke Sister    Atrial fibrillation Sister    Esophageal cancer  Neg Hx    Rectal cancer Neg Hx    Stomach cancer Neg Hx      Social History   Socioeconomic History   Marital status: Married    Spouse name: Not on file   Number of children: 1   Years of education: Not on file   Highest education level: Not on file  Occupational History   Occupation: retired  Tobacco Use   Smoking status: Some Days    Types: Cigarettes    Last attempt to quit: 01/07/1998    Years since quitting: 23.5   Smokeless tobacco: Never   Tobacco comments:    Smokes "every once in a while"   07/25/21 ARJ   Vaping Use   Vaping Use: Never used  Substance and Sexual Activity   Alcohol use: No   Drug use: No   Sexual activity: Yes  Other  Topics Concern   Not on file  Social History Narrative   Patients daughter passed away at 19 from Breast Cancer.       Right Handed    Lives in two story home    Social Determinants of Health   Financial Resource Strain: Low Risk  (09/18/2020)   Overall Financial Resource Strain (CARDIA)    Difficulty of Paying Living Expenses: Not hard at all  Food Insecurity: No Food Insecurity (09/18/2020)   Hunger Vital Sign    Worried About Running Out of Food in the Last Year: Never true    Ran Out of Food in the Last Year: Never true  Transportation Needs: No Transportation Needs (09/18/2020)   PRAPARE - Hydrologist (Medical): No    Lack of Transportation (Non-Medical): No  Physical Activity: Sufficiently Active (09/18/2020)   Exercise Vital Sign    Days of Exercise per Week: 5 days    Minutes of Exercise per Session: 30 min  Stress: No Stress Concern Present (09/18/2020)   Stow    Feeling of Stress : Not at all  Social Connections: [place] (09/18/2020)   Social Connection and Isolation Panel [NHANES]    Frequency of Communication with Friends and Family: More than three times a week    Frequency of Social Gatherings with Friends and Family: More than three times a week    Attends Religious Services: More than 4 times per year    Active Member of [employer] or Organizations: Yes    Attends Music therapist: More than 4 times per year    Marital Status: Married  Human resources officer Violence: Not At Risk (09/18/2020)   Humiliation, Afraid, Rape, and Kick questionnaire    Fear of Current or Ex-Partner: No    Emotionally Abused: No    Physically Abused: No    Sexually Abused: No    Has worked as an Restaurant manager, fast food From [employer]  Allergies  Allergen Reactions   Latex     Contact  dermatitis   Bromocriptine Nausea Only    Dry heaves   Zestril [Lisinopril] Other (See  Comments)    Leg pain, inc HR, dizziness, headache   Diovan [Valsartan] Nausea Only   Hydrochlorothiazide Other (See Comments)    Possibly caused pancreatitis 2018   Norvasc [Amlodipine] Other (See Comments)    Edema    Zocor [Simvastatin]     Joint pain     Outpatient Medications Prior to Visit  Medication Sig Dispense Refill   acetaminophen (TYLENOL) 500 MG  tablet Take 500 mg by mouth every 6 (six) hours as needed (pain).     aspirin EC 81 MG tablet Take 1 tablet (81 mg total) by mouth at bedtime. Okay to restart this medication on 07/03/2021 30 tablet 11   Cholecalciferol (VITAMIN D3) 2000 UNITS TABS Take 2,000 Units by mouth in the morning and at bedtime.     Coenzyme Q10 (COQ-10) 100 MG CAPS Take 100 mg by mouth in the morning.     gabapentin (NEURONTIN) 300 MG capsule Take 1 tablet at bedtime and 2 tablets at bedtime. 90 capsule 5   Garlic 6160 MG CAPS Take 1,000 mg by mouth in the morning.     hydrALAZINE (APRESOLINE) 25 MG tablet Take 1 tablet (25 mg total) by mouth 3 (three) times daily. 270 tablet 3   irbesartan (AVAPRO) 300 MG tablet TAKE 1 TABLET BY MOUTH EVERY DAY 30 tablet 1   Menaquinone-7 (VITAMIN K2) 100 MCG CAPS Take 100 mcg by mouth in the morning.     nitroGLYCERIN (NITROSTAT) 0.4 MG SL tablet Place 1 tablet (0.4 mg total) under the tongue every 5 (five) minutes x 3 doses as needed for chest pain (if no relief after 2nd dose, proceed to ED or call 911). 25 tablet 3   Omega-3 Fatty Acids (OMEGA-3 FISH OIL PO) Take 1,000 mg by mouth in the morning.     omeprazole (PRILOSEC) 40 MG capsule Take 1 capsule (40 mg total) by mouth in the morning and at bedtime. 60 capsule 11   Polyethyl Glycol-Propyl Glycol (LUBRICANT EYE DROPS) 0.4-0.3 % SOLN Place 1-2 drops into both eyes 3 (three) times daily as needed (dry/irritated eyes.).     pyrithione zinc (SELSUN BLUE DRY SCALP) 1 % shampoo Apply 1 application  topically daily.     rosuvastatin (CRESTOR) 20 MG tablet Take 0.5 tablets  (10 mg total) by mouth every evening. TAKE HALF A TABLET (10 MG) BY MOUTH ONCE DAILY.     tadalafil (CIALIS) 5 MG tablet Take 5 mg by mouth daily as needed for erectile dysfunction.     tamsulosin (FLOMAX) 0.4 MG CAPS capsule Take 0.4 mg by mouth in the morning.     Testosterone 20.25 MG/ACT (1.62%) GEL Apply 2 Pump topically in the morning.     TURMERIC PO Take 1,000 mg by mouth in the morning.     vitamin C (ASCORBIC ACID) 500 MG tablet Take 500 mg by mouth in the morning.     No facility-administered medications prior to visit.         Objective:   Physical Exam  Vitals:   07/25/21 0909  BP: 130/60  Pulse: (!) 54  Temp: 97.8 F (36.6 C)  TempSrc: Oral  Weight: 177 lb 12.8 oz (80.6 kg)  Height: 6' (1.829 m)    Gen: Pleasant, well-nourished, in no distress,  normal affect  ENT: No lesions,  mouth clear,  oropharynx clear, no postnasal drip  Neck: No JVD, no stridor  Lungs: No use of accessory muscles, no crackles or wheezing on normal respiration, no wheeze on forced expiration  Cardiovascular: RRR, heart sounds normal, no murmur or gallops, no peripheral edema  Musculoskeletal: No deformities, no cyanosis or clubbing  Neuro: alert, awake, non focal  Skin: Warm, no lesions or rash     Assessment & Plan:  Pulmonary nodules Right upper lobe and left upper lobe nodules biopsied.  Left upper lobe was a bit equivocal due to inadequate tissue but was suggestive of adenocarcinoma,  right upper lobe consistent w squamous cell.  Question whether these are the same process versus 2 separate processes.  He does also have right hilar adenopathy.  Patient and his wife question whether he is a surgical candidate but given the multiple right-sided nodules (only 1 of these was sampled), bilateral disease and hilar adenopathy I doubt he is a surgical candidate.  He is going to go see thoracic surgery at [facility] next week.  We will try to get all of the data collected for him to take with  him so they have adequate access.  We reviewed your imaging and pathology results from bronchoscopy today. Follow with oncology and thoracic surgery at [facility] as planned. We will provide you with copies of your pathology report.  Is possible that they will want to look at the actual slides from [facility] pathology going forward. Would recommend that you take a disc from medical records containing your imaging to [facility] with you.  We will try to help you arrange for this. Follow Dr. Lamonte Sakai as needed   Baltazar Apo, MD, PhD 07/25/2021, 9:48 AM [facility] [phone] or if no answer before 7:00PM call [phone] For any issues after 7:00PM please call eLink [phone]

## 2021-07-25 NOTE — Patient Instructions (Signed)
We reviewed your imaging and pathology results from bronchoscopy today. Follow with oncology and thoracic surgery at [facility] as planned. We will provide you with copies of your pathology report.  Is possible that they will want to look at the actual slides from [facility] pathology going forward. Would recommend that you take a disc from medical records containing your imaging to [facility] with you.  We will try to help you arrange for this. Follow Dr. Lamonte Sakai as needed

## 2021-07-25 NOTE — Assessment & Plan Note (Addendum)
Right upper lobe and left upper lobe nodules biopsied.  Left upper lobe was a bit equivocal due to inadequate tissue but was suggestive of adenocarcinoma, right upper lobe consistent w squamous cell.  Question whether these are the same process versus 2 separate processes.  He does also have right hilar adenopathy.  Patient and his wife question whether he is a surgical candidate but given the multiple right-sided nodules (only 1 of these was sampled), bilateral disease and hilar adenopathy I doubt he is a surgical candidate.  He is going to go see thoracic surgery at [facility] next week.  We will try to get all of the data collected for him to take with him so they have adequate access.  We reviewed your imaging and pathology results from bronchoscopy today. Follow with oncology and thoracic surgery at [facility] as planned. We will provide you with copies of your pathology report.  Is possible that they will want to look at the actual slides from [facility] pathology going forward. Would recommend that you take a disc from medical records containing your imaging to [facility] with you.  We will try to help you arrange for this. Follow Dr. Lamonte Sakai as needed

## 2021-07-30 DIAGNOSIS — K219 Gastro-esophageal reflux disease without esophagitis: Secondary | ICD-10-CM | POA: Diagnosis not present

## 2021-07-30 DIAGNOSIS — R590 Localized enlarged lymph nodes: Secondary | ICD-10-CM | POA: Diagnosis not present

## 2021-07-30 DIAGNOSIS — R942 Abnormal results of pulmonary function studies: Secondary | ICD-10-CM | POA: Diagnosis not present

## 2021-07-30 DIAGNOSIS — C3490 Malignant neoplasm of unspecified part of unspecified bronchus or lung: Secondary | ICD-10-CM | POA: Diagnosis not present

## 2021-07-30 DIAGNOSIS — R9431 Abnormal electrocardiogram [ECG] [EKG]: Secondary | ICD-10-CM | POA: Diagnosis not present

## 2021-07-30 DIAGNOSIS — Z720 Tobacco use: Secondary | ICD-10-CM | POA: Diagnosis not present

## 2021-07-30 DIAGNOSIS — Z87891 Personal history of nicotine dependence: Secondary | ICD-10-CM | POA: Diagnosis not present

## 2021-07-30 DIAGNOSIS — R918 Other nonspecific abnormal finding of lung field: Secondary | ICD-10-CM | POA: Diagnosis not present

## 2021-07-30 DIAGNOSIS — C3412 Malignant neoplasm of upper lobe, left bronchus or lung: Secondary | ICD-10-CM | POA: Diagnosis not present

## 2021-07-30 DIAGNOSIS — C3411 Malignant neoplasm of upper lobe, right bronchus or lung: Secondary | ICD-10-CM | POA: Diagnosis not present

## 2021-07-30 DIAGNOSIS — R0602 Shortness of breath: Secondary | ICD-10-CM | POA: Diagnosis not present

## 2021-07-30 DIAGNOSIS — I2510 Atherosclerotic heart disease of native coronary artery without angina pectoris: Secondary | ICD-10-CM | POA: Diagnosis not present

## 2021-07-30 DIAGNOSIS — Z951 Presence of aortocoronary bypass graft: Secondary | ICD-10-CM | POA: Diagnosis not present

## 2021-07-30 DIAGNOSIS — Z01818 Encounter for other preprocedural examination: Secondary | ICD-10-CM | POA: Diagnosis not present

## 2021-07-30 DIAGNOSIS — R54 Age-related physical debility: Secondary | ICD-10-CM | POA: Diagnosis not present

## 2021-07-30 DIAGNOSIS — F17210 Nicotine dependence, cigarettes, uncomplicated: Secondary | ICD-10-CM | POA: Diagnosis not present

## 2021-07-30 DIAGNOSIS — C3492 Malignant neoplasm of unspecified part of left bronchus or lung: Secondary | ICD-10-CM | POA: Diagnosis not present

## 2021-07-30 DIAGNOSIS — C3491 Malignant neoplasm of unspecified part of right bronchus or lung: Secondary | ICD-10-CM | POA: Diagnosis not present

## 2021-07-30 DIAGNOSIS — R7303 Prediabetes: Secondary | ICD-10-CM | POA: Diagnosis not present

## 2021-07-30 DIAGNOSIS — I440 Atrioventricular block, first degree: Secondary | ICD-10-CM | POA: Diagnosis not present

## 2021-07-30 DIAGNOSIS — I10 Essential (primary) hypertension: Secondary | ICD-10-CM | POA: Diagnosis not present

## 2021-07-30 DIAGNOSIS — Z7982 Long term (current) use of aspirin: Secondary | ICD-10-CM | POA: Diagnosis not present

## 2021-07-30 DIAGNOSIS — Z79899 Other long term (current) drug therapy: Secondary | ICD-10-CM | POA: Diagnosis not present

## 2021-07-30 DIAGNOSIS — J449 Chronic obstructive pulmonary disease, unspecified: Secondary | ICD-10-CM | POA: Diagnosis not present

## 2021-08-07 DIAGNOSIS — J439 Emphysema, unspecified: Secondary | ICD-10-CM | POA: Diagnosis not present

## 2021-08-07 DIAGNOSIS — F17210 Nicotine dependence, cigarettes, uncomplicated: Secondary | ICD-10-CM | POA: Diagnosis not present

## 2021-08-07 DIAGNOSIS — K449 Diaphragmatic hernia without obstruction or gangrene: Secondary | ICD-10-CM | POA: Diagnosis not present

## 2021-08-07 DIAGNOSIS — K219 Gastro-esophageal reflux disease without esophagitis: Secondary | ICD-10-CM | POA: Diagnosis not present

## 2021-08-07 DIAGNOSIS — C3411 Malignant neoplasm of upper lobe, right bronchus or lung: Secondary | ICD-10-CM | POA: Diagnosis not present

## 2021-08-07 DIAGNOSIS — C3412 Malignant neoplasm of upper lobe, left bronchus or lung: Secondary | ICD-10-CM | POA: Diagnosis not present

## 2021-08-07 DIAGNOSIS — R846 Abnormal cytological findings in specimens from respiratory organs and thorax: Secondary | ICD-10-CM | POA: Diagnosis not present

## 2021-08-07 DIAGNOSIS — C771 Secondary and unspecified malignant neoplasm of intrathoracic lymph nodes: Secondary | ICD-10-CM | POA: Diagnosis not present

## 2021-08-07 DIAGNOSIS — I10 Essential (primary) hypertension: Secondary | ICD-10-CM | POA: Diagnosis not present

## 2021-08-07 DIAGNOSIS — R0989 Other specified symptoms and signs involving the circulatory and respiratory systems: Secondary | ICD-10-CM | POA: Diagnosis not present

## 2021-08-07 DIAGNOSIS — R0609 Other forms of dyspnea: Secondary | ICD-10-CM | POA: Diagnosis not present

## 2021-08-07 DIAGNOSIS — Z85828 Personal history of other malignant neoplasm of skin: Secondary | ICD-10-CM | POA: Diagnosis not present

## 2021-08-07 DIAGNOSIS — Z7982 Long term (current) use of aspirin: Secondary | ICD-10-CM | POA: Diagnosis not present

## 2021-08-07 DIAGNOSIS — C3491 Malignant neoplasm of unspecified part of right bronchus or lung: Secondary | ICD-10-CM | POA: Diagnosis not present

## 2021-08-07 DIAGNOSIS — E230 Hypopituitarism: Secondary | ICD-10-CM | POA: Diagnosis not present

## 2021-08-07 DIAGNOSIS — R590 Localized enlarged lymph nodes: Secondary | ICD-10-CM | POA: Diagnosis not present

## 2021-08-07 DIAGNOSIS — Z79899 Other long term (current) drug therapy: Secondary | ICD-10-CM | POA: Diagnosis not present

## 2021-08-07 DIAGNOSIS — I2510 Atherosclerotic heart disease of native coronary artery without angina pectoris: Secondary | ICD-10-CM | POA: Diagnosis not present

## 2021-08-10 ENCOUNTER — Other Ambulatory Visit: Payer: Self-pay | Admitting: Internal Medicine

## 2021-08-15 DIAGNOSIS — C3492 Malignant neoplasm of unspecified part of left bronchus or lung: Secondary | ICD-10-CM | POA: Diagnosis not present

## 2021-08-15 DIAGNOSIS — C3491 Malignant neoplasm of unspecified part of right bronchus or lung: Secondary | ICD-10-CM | POA: Diagnosis not present

## 2021-08-20 DIAGNOSIS — R3912 Poor urinary stream: Secondary | ICD-10-CM | POA: Diagnosis not present

## 2021-08-23 DIAGNOSIS — C3491 Malignant neoplasm of unspecified part of right bronchus or lung: Secondary | ICD-10-CM | POA: Insufficient documentation

## 2021-08-23 DIAGNOSIS — C3492 Malignant neoplasm of unspecified part of left bronchus or lung: Secondary | ICD-10-CM | POA: Diagnosis not present

## 2021-08-23 DIAGNOSIS — C3411 Malignant neoplasm of upper lobe, right bronchus or lung: Secondary | ICD-10-CM | POA: Diagnosis not present

## 2021-08-23 DIAGNOSIS — J449 Chronic obstructive pulmonary disease, unspecified: Secondary | ICD-10-CM | POA: Diagnosis not present

## 2021-08-23 DIAGNOSIS — C3412 Malignant neoplasm of upper lobe, left bronchus or lung: Secondary | ICD-10-CM | POA: Diagnosis not present

## 2021-08-23 DIAGNOSIS — Z87891 Personal history of nicotine dependence: Secondary | ICD-10-CM | POA: Diagnosis not present

## 2021-08-23 DIAGNOSIS — C771 Secondary and unspecified malignant neoplasm of intrathoracic lymph nodes: Secondary | ICD-10-CM | POA: Diagnosis not present

## 2021-08-23 DIAGNOSIS — Z7952 Long term (current) use of systemic steroids: Secondary | ICD-10-CM | POA: Diagnosis not present

## 2021-08-23 DIAGNOSIS — R350 Frequency of micturition: Secondary | ICD-10-CM | POA: Diagnosis not present

## 2021-08-28 ENCOUNTER — Encounter: Payer: Self-pay | Admitting: Internal Medicine

## 2021-08-28 NOTE — Progress Notes (Unsigned)
Subjective:    Patient ID: Patrick Harris, male    DOB: [birth year], 76 y.o.   MRN: [id number]     HPI Patrick Harris is here for follow up of his chronic medical problems, including htn, prediabetes, non-small lung cancer  He is taking all of his medications as prescribed.    Going to [facility] oncology for lung cancer.  Overall doing ok.  Having port put in tomorrow.   Then will do chemo x 3 and then talk about surgery.    He is exercising and eating well.    Medications and allergies reviewed with patient and updated if appropriate.  Current Outpatient Medications on File Prior to Visit  Medication Sig Dispense Refill   acetaminophen (TYLENOL) 500 MG tablet Take 500 mg by mouth every 6 (six) hours as needed (pain).     aspirin EC 81 MG tablet Take 1 tablet (81 mg total) by mouth at bedtime. Okay to restart this medication on 07/03/2021 30 tablet 11   Cholecalciferol (VITAMIN D3) 2000 UNITS TABS Take 2,000 Units by mouth in the morning and at bedtime.     Coenzyme Q10 (COQ-10) 100 MG CAPS Take 100 mg by mouth in the morning.     gabapentin (NEURONTIN) 300 MG capsule Take 1 tablet at bedtime and 2 tablets at bedtime. 90 capsule 5   Garlic 5784 MG CAPS Take 1,000 mg by mouth in the morning.     hydrALAZINE (APRESOLINE) 25 MG tablet Take 1 tablet (25 mg total) by mouth 3 (three) times daily. 270 tablet 3   irbesartan (AVAPRO) 300 MG tablet Take 1 tablet (300 mg total) by mouth daily. Annual appt is due must see provider for future refills 30 tablet 0   Menaquinone-7 (VITAMIN K2) 100 MCG CAPS Take 100 mcg by mouth in the morning.     nitroGLYCERIN (NITROSTAT) 0.4 MG SL tablet Place 1 tablet (0.4 mg total) under the tongue every 5 (five) minutes x 3 doses as needed for chest pain (if no relief after 2nd dose, proceed to ED or call 911). 25 tablet 3   Omega-3 Fatty Acids (OMEGA-3 FISH OIL PO) Take 1,000 mg by mouth in the morning.     omeprazole (PRILOSEC) 40 MG capsule Take 1 capsule (40 mg  total) by mouth in the morning and at bedtime. 60 capsule 11   Polyethyl Glycol-Propyl Glycol (LUBRICANT EYE DROPS) 0.4-0.3 % SOLN Place 1-2 drops into both eyes 3 (three) times daily as needed (dry/irritated eyes.).     pyrithione zinc (SELSUN BLUE DRY SCALP) 1 % shampoo Apply 1 application  topically daily.     rosuvastatin (CRESTOR) 20 MG tablet Take 0.5 tablets (10 mg total) by mouth every evening. TAKE HALF A TABLET (10 MG) BY MOUTH ONCE DAILY.     tadalafil (CIALIS) 5 MG tablet Take 5 mg by mouth daily as needed for erectile dysfunction.     tamsulosin (FLOMAX) 0.4 MG CAPS capsule Take 0.4 mg by mouth in the morning.     Testosterone 20.25 MG/ACT (1.62%) GEL Apply 2 Pump topically in the morning.     TURMERIC PO Take 1,000 mg by mouth in the morning.     vitamin C (ASCORBIC ACID) 500 MG tablet Take 500 mg by mouth in the morning.     No current facility-administered medications on file prior to visit.     Review of Systems  Constitutional:  Negative for fatigue and fever.  Respiratory:  Positive for cough (  minimal). Negative for shortness of breath and wheezing.   Cardiovascular:  Positive for chest pain (right sided - since bronchoscopy - intermittent) and leg swelling (long car trips). Negative for palpitations.  Gastrointestinal:  Positive for constipation (very mild). Negative for abdominal pain, diarrhea and nausea.  Neurological:  Negative for light-headedness and headaches.  Psychiatric/Behavioral:  Negative for dysphoric mood. The patient is not nervous/anxious.        Objective:   Vitals:   08/29/21 0808  BP: 128/62  Pulse: (!) 45  Temp: 98 F (36.7 C)  SpO2: 92%   BP Readings from Last 3 Encounters:  08/29/21 128/62  07/25/21 130/60  07/17/21 132/78   Wt Readings from Last 3 Encounters:  08/29/21 181 lb (82.1 kg)  07/25/21 177 lb 12.8 oz (80.6 kg)  07/17/21 180 lb (81.6 kg)   Body mass index is 24.55 kg/m.    Physical Exam Constitutional:       General: He is not in acute distress.    Appearance: Normal appearance. He is not ill-appearing.  HENT:     Head: Normocephalic and atraumatic.  Eyes:     Conjunctiva/sclera: Conjunctivae normal.  Cardiovascular:     Rate and Rhythm: Normal rate and regular rhythm.     Heart sounds: Normal heart sounds. No murmur heard. Pulmonary:     Effort: Pulmonary effort is normal. No respiratory distress.     Breath sounds: Normal breath sounds. No wheezing or rales.  Abdominal:     General: There is no distension.     Palpations: Abdomen is soft.     Tenderness: There is no abdominal tenderness.  Musculoskeletal:     Right lower leg: No edema.     Left lower leg: No edema.  Skin:    General: Skin is warm and dry.     Findings: No rash.  Neurological:     Mental Status: He is alert. Mental status is at baseline.  Psychiatric:        Mood and Affect: Mood normal.        Lab Results  Component Value Date   WBC 7.1 07/02/2021   HGB 17.3 (H) 07/02/2021   HCT 51.3 07/02/2021   PLT 162 07/02/2021   GLUCOSE 100 (H) 07/02/2021   CHOL 118 08/29/2020   TRIG 96.0 08/29/2020   HDL 33.60 (L) 08/29/2020   LDLCALC 65 08/29/2020   ALT 15 05/25/2021   AST 16 05/25/2021   NA 136 07/02/2021   K 4.3 07/02/2021   CL 103 07/02/2021   CREATININE 0.98 07/02/2021   BUN 17 07/02/2021   CO2 22 07/02/2021   TSH 1.25 05/25/2021   PSA 1.5 08/25/2019   INR 1.17 01/18/2015   HGBA1C 6.0 08/29/2020     Assessment & Plan:    See Problem List for Assessment and Plan of chronic medical problems.

## 2021-08-29 ENCOUNTER — Ambulatory Visit (INDEPENDENT_AMBULATORY_CARE_PROVIDER_SITE_OTHER): Payer: Medicare Other | Admitting: Internal Medicine

## 2021-08-29 VITALS — BP 128/62 | HR 45 | Temp 98.0°F | Ht 72.0 in | Wt 181.0 lb

## 2021-08-29 DIAGNOSIS — R7303 Prediabetes: Secondary | ICD-10-CM | POA: Diagnosis not present

## 2021-08-29 DIAGNOSIS — I10 Essential (primary) hypertension: Secondary | ICD-10-CM | POA: Diagnosis not present

## 2021-08-29 DIAGNOSIS — C3491 Malignant neoplasm of unspecified part of right bronchus or lung: Secondary | ICD-10-CM

## 2021-08-29 MED ORDER — HYDRALAZINE HCL 25 MG PO TABS: 25.0000 mg | ORAL_TABLET | Freq: Three times a day (TID) | ORAL | 3 refills | Status: DC

## 2021-08-29 MED ORDER — IRBESARTAN 300 MG PO TABS: 300.0000 mg | ORAL_TABLET | Freq: Every day | ORAL | 3 refills | Status: DC

## 2021-08-29 NOTE — Assessment & Plan Note (Signed)
Going to [facility] oncology For port evaluation tomorrow with surgery To then start chemo x 3  And then evaluate for surgery

## 2021-08-29 NOTE — Patient Instructions (Addendum)
     Blood work was ordered.     Medications changes include :   none   Your prescription(s) have been sent to your pharmacy.    Return in about 1 year (around 08/30/2022) for follow up.

## 2021-08-29 NOTE — Assessment & Plan Note (Signed)
Chronic BP well controlled Continue hydralazine 25 mg TID, avapro 300 mg daily cmp done recently = reviewed

## 2021-08-29 NOTE — Assessment & Plan Note (Signed)
Chronic Lab Results  Component Value Date   HGBA1C 6.0 08/29/2020   Encouraged healthy diet and maintaining exercise Will hold off on checking a1c

## 2021-08-30 DIAGNOSIS — C3491 Malignant neoplasm of unspecified part of right bronchus or lung: Secondary | ICD-10-CM | POA: Diagnosis not present

## 2021-08-30 DIAGNOSIS — Z87891 Personal history of nicotine dependence: Secondary | ICD-10-CM | POA: Diagnosis not present

## 2021-08-30 DIAGNOSIS — R350 Frequency of micturition: Secondary | ICD-10-CM | POA: Diagnosis not present

## 2021-08-30 DIAGNOSIS — Z7952 Long term (current) use of systemic steroids: Secondary | ICD-10-CM | POA: Diagnosis not present

## 2021-08-30 DIAGNOSIS — C3492 Malignant neoplasm of unspecified part of left bronchus or lung: Secondary | ICD-10-CM | POA: Diagnosis not present

## 2021-08-30 DIAGNOSIS — J449 Chronic obstructive pulmonary disease, unspecified: Secondary | ICD-10-CM | POA: Diagnosis not present

## 2021-08-31 DIAGNOSIS — J439 Emphysema, unspecified: Secondary | ICD-10-CM | POA: Diagnosis not present

## 2021-08-31 DIAGNOSIS — Z01818 Encounter for other preprocedural examination: Secondary | ICD-10-CM | POA: Diagnosis not present

## 2021-09-03 DIAGNOSIS — C3412 Malignant neoplasm of upper lobe, left bronchus or lung: Secondary | ICD-10-CM | POA: Diagnosis not present

## 2021-09-03 DIAGNOSIS — I10 Essential (primary) hypertension: Secondary | ICD-10-CM | POA: Diagnosis not present

## 2021-09-03 DIAGNOSIS — J432 Centrilobular emphysema: Secondary | ICD-10-CM | POA: Diagnosis not present

## 2021-09-03 DIAGNOSIS — I2510 Atherosclerotic heart disease of native coronary artery without angina pectoris: Secondary | ICD-10-CM | POA: Diagnosis not present

## 2021-09-03 DIAGNOSIS — J449 Chronic obstructive pulmonary disease, unspecified: Secondary | ICD-10-CM | POA: Diagnosis not present

## 2021-09-03 DIAGNOSIS — F17210 Nicotine dependence, cigarettes, uncomplicated: Secondary | ICD-10-CM | POA: Diagnosis not present

## 2021-09-03 DIAGNOSIS — Z91040 Latex allergy status: Secondary | ICD-10-CM | POA: Diagnosis not present

## 2021-09-03 DIAGNOSIS — C3491 Malignant neoplasm of unspecified part of right bronchus or lung: Secondary | ICD-10-CM | POA: Diagnosis not present

## 2021-09-03 DIAGNOSIS — J438 Other emphysema: Secondary | ICD-10-CM | POA: Diagnosis not present

## 2021-09-03 DIAGNOSIS — K219 Gastro-esophageal reflux disease without esophagitis: Secondary | ICD-10-CM | POA: Diagnosis not present

## 2021-09-03 DIAGNOSIS — Z972 Presence of dental prosthetic device (complete) (partial): Secondary | ICD-10-CM | POA: Diagnosis not present

## 2021-09-03 DIAGNOSIS — C3411 Malignant neoplasm of upper lobe, right bronchus or lung: Secondary | ICD-10-CM | POA: Diagnosis not present

## 2021-09-03 DIAGNOSIS — C3492 Malignant neoplasm of unspecified part of left bronchus or lung: Secondary | ICD-10-CM | POA: Diagnosis not present

## 2021-09-03 DIAGNOSIS — Z7982 Long term (current) use of aspirin: Secondary | ICD-10-CM | POA: Diagnosis not present

## 2021-09-03 DIAGNOSIS — Z951 Presence of aortocoronary bypass graft: Secondary | ICD-10-CM | POA: Diagnosis not present

## 2021-09-03 DIAGNOSIS — C3410 Malignant neoplasm of upper lobe, unspecified bronchus or lung: Secondary | ICD-10-CM | POA: Diagnosis not present

## 2021-09-03 DIAGNOSIS — Z888 Allergy status to other drugs, medicaments and biological substances status: Secondary | ICD-10-CM | POA: Diagnosis not present

## 2021-09-03 DIAGNOSIS — Z79899 Other long term (current) drug therapy: Secondary | ICD-10-CM | POA: Diagnosis not present

## 2021-09-05 ENCOUNTER — Encounter (HOSPITAL_COMMUNITY)
Admission: RE | Admit: 2021-09-05 | Discharge: 2021-09-05 | Disposition: A | Discharge status: Home / Self Care | Payer: Medicare Other | Source: Ambulatory Visit | Attending: Oncology | Admitting: Oncology

## 2021-09-05 ENCOUNTER — Encounter (HOSPITAL_COMMUNITY): Payer: Self-pay

## 2021-09-05 VITALS — BP 132/62 | HR 68 | Ht 72.0 in | Wt 182.8 lb

## 2021-09-05 DIAGNOSIS — C3412 Malignant neoplasm of upper lobe, left bronchus or lung: Secondary | ICD-10-CM | POA: Insufficient documentation

## 2021-09-05 NOTE — Progress Notes (Signed)
Pulmonary Individual Treatment Plan  Patient Details  Name: Patrick Harris MRN: [id number] Date of Birth: [birth year] Referring Provider:   [place] from 09/05/2021 in [place]  Referring Provider Dr. Loletta Specter       Initial Encounter Date:  Flowsheet Row PULMONARY REHAB OTHER RESP ORIENTATION from 09/05/2021 in [place]  Date 09/05/21       Visit Diagnosis: Malignant neoplasm of upper lobe of left lung ([place])  Patient's Home Medications on Admission:   Current Outpatient Medications:    acetaminophen (TYLENOL) 650 MG CR tablet, Take 1,300 mg by mouth every 8 (eight) hours as needed for pain., Disp: , Rfl:    aspirin EC 81 MG tablet, Take 1 tablet (81 mg total) by mouth at bedtime. Okay to restart this medication on 07/03/2021, Disp: 30 tablet, Rfl: 11   Cholecalciferol (VITAMIN D3) 2000 UNITS TABS, Take 2,000 Units by mouth in the morning and at bedtime., Disp: , Rfl:    Coenzyme Q10 (COQ-10) 100 MG CAPS, Take 100 mg by mouth in the morning., Disp: , Rfl:    Cyanocobalamin (B-12 PO), Take 1 tablet by mouth daily., Disp: , Rfl:    gabapentin (NEURONTIN) 300 MG capsule, Take 1 tablet at bedtime and 2 tablets at bedtime., Disp: 90 capsule, Rfl: 5   Garlic 4098 MG CAPS, Take 1,000 mg by mouth in the morning., Disp: , Rfl:    hydrALAZINE (APRESOLINE) 25 MG tablet, Take 1 tablet (25 mg total) by mouth 3 (three) times daily., Disp: 270 tablet, Rfl: 3   irbesartan (AVAPRO) 300 MG tablet, Take 1 tablet (300 mg total) by mouth daily., Disp: 90 tablet, Rfl: 3   Menaquinone-7 (VITAMIN K2) 100 MCG CAPS, Take 100 mcg by mouth in the morning., Disp: , Rfl:    nitroGLYCERIN (NITROSTAT) 0.4 MG SL tablet, Place 1 tablet (0.4 mg total) under the tongue every 5 (five) minutes x 3 doses as needed for chest pain (if no relief after 2nd dose, proceed to ED or call 911)., Disp: 25 tablet, Rfl: 3   Omega-3 Fatty Acids  (OMEGA-3 FISH OIL PO), Take 1,000 mg by mouth in the morning., Disp: , Rfl:    omeprazole (PRILOSEC) 40 MG capsule, Take 1 capsule (40 mg total) by mouth in the morning and at bedtime. (Patient taking differently: Take 40 mg by mouth daily.), Disp: 60 capsule, Rfl: 11   Polyethyl Glycol-Propyl Glycol (LUBRICANT EYE DROPS) 0.4-0.3 % SOLN, Place 1-2 drops into both eyes 3 (three) times daily as needed (dry/irritated eyes.)., Disp: , Rfl:    pyrithione zinc (SELSUN BLUE DRY SCALP) 1 % shampoo, Apply 1 application  topically daily., Disp: , Rfl:    rosuvastatin (CRESTOR) 20 MG tablet, Take 0.5 tablets (10 mg total) by mouth every evening. TAKE HALF A TABLET (10 MG) BY MOUTH ONCE DAILY., Disp: , Rfl:    tamsulosin (FLOMAX) 0.4 MG CAPS capsule, Take 0.4 mg by mouth in the morning., Disp: , Rfl:    Testosterone 20.25 MG/ACT (1.62%) GEL, Apply 2 Pump topically in the morning., Disp: , Rfl:    TURMERIC PO, Take 1,000 mg by mouth in the morning., Disp: , Rfl:    vitamin C (ASCORBIC ACID) 500 MG tablet, Take 500 mg by mouth in the morning., Disp: , Rfl:   Past Medical History: Past Medical History:  Diagnosis Date   Arthritis    CAD (coronary artery disease)    CABG with LIMA to  LAD and RIMA to RCA August 2000 - Dr. Roxan Hockey   Cancer [facility])    basal cell   CHF (congestive heart failure) (HCC)    COPD (chronic obstructive pulmonary disease) (HCC)    Elevated glycated hemoglobin    Essential hypertension    GERD (gastroesophageal reflux disease)    Heart murmur    Hyperlipidemia    Hypopituitarism ([place])    Osteopenia    Dr Wilson Singer   Pancreatitis    Peyronie disease    Testosterone deficiency    Dr Wilson Singer    Tobacco Use: Social History   Tobacco Use  Smoking Status Some Days   Types: Cigarettes   Last attempt to quit: 01/07/1998   Years since quitting: 23.6  Smokeless Tobacco Never  Tobacco Comments   Smokes "every once in a while"   07/25/21 [facility]     Labs: Review Flowsheet  More data  exists      Latest Ref Rng & Units 08/22/2017 12/23/2017 08/24/2018 08/25/2019 08/29/2020  Labs for [facility]P Cardiac and Pulmonary Rehab  Cholestrol 0 - 200 mg/dL 128  137  146  122  118   LDL (calc) 0 - 99 mg/dL 71  81  86  71  65   HDL-C >39.00 mg/dL 29.10  36  34.20  33  33.60   Trlycerides 0.0 - 149.0 mg/dL 140.0  101  130.0  98  96.0   Hemoglobin A1c 4.6 - 6.5 % 6.2  5.9  6.1  5.5  6.0     Capillary Blood Glucose: Lab Results  Component Value Date   GLUCAP 117 (H) 07/23/2021     Pulmonary Assessment Scores:  Pulmonary Assessment Scores     Row Name 09/05/21 1249         ADL UCSD   ADL Phase Entry     SOB Score total 16     Rest 0     Walk 2     Stairs 3     Bath 0     Dress 0     Shop 0       CAT Score   CAT Score 16       mMRC Score   mMRC Score 0             UCSD: Self-administered rating of dyspnea associated with activities of daily living (ADLs) 6-point scale (0 = "not at all" to 5 = "maximal or unable to do because of breathlessness")  Scoring Scores range from 0 to 120.  Minimally important difference is 5 units  CAT: CAT can identify the health impairment of COPD patients and is better correlated with disease progression.  CAT has a scoring range of zero to 40. The CAT score is classified into four groups of low (less than 10), medium (10 - 20), high (21-30) and very high (31-40) based on the impact level of disease on health status. A CAT score over 10 suggests significant symptoms.  A worsening CAT score could be explained by an exacerbation, poor medication adherence, poor inhaler technique, or progression of COPD or comorbid conditions.  CAT MCID is 2 points  mMRC: mMRC (Modified Medical Research Council) Dyspnea Scale is used to assess the degree of baseline functional disability in patients of respiratory disease due to dyspnea. No minimal important difference is established. A decrease in score of 1 point or greater is considered a positive  change.   Pulmonary Function Assessment:   Exercise Target Goals: Exercise Program Goal:  Individual exercise prescription set using results from initial 6 min walk test and THRR while considering  patient's activity barriers and safety.   Exercise Prescription Goal: Initial exercise prescription builds to 30-45 minutes a day of aerobic activity, 2-3 days per week.  Home exercise guidelines will be given to patient during program as part of exercise prescription that the participant will acknowledge.  Activity Barriers & Risk Stratification:  Activity Barriers & Cardiac Risk Stratification - 09/05/21 1310       Activity Barriers & Cardiac Risk Stratification   Activity Barriers Neck/Spine Problems;Joint Problems    Cardiac Risk Stratification High             6 Minute Walk:  6 Minute Walk     Row Name 09/05/21 1311         6 Minute Walk   Phase Initial     Distance 1400 feet     Walk Time 6 minutes     # of Rest Breaks 0     MPH 2.65     METS 2.92     RPE 11     Perceived Dyspnea  11     VO2 Peak 10.24     Symptoms No     Resting HR 68 bpm     Resting BP 132/62     Resting Oxygen Saturation  95 %     Exercise Oxygen Saturation  during 6 min walk 94 %     Max Ex. HR 75 bpm     Max Ex. BP 160/60     2 Minute Post BP 130/60       Interval HR   1 Minute HR 74     2 Minute HR 75     3 Minute HR 74     4 Minute HR 74     5 Minute HR 74     6 Minute HR 75     2 Minute Post HR 65     Interval Heart Rate? Yes       Interval Oxygen   Interval Oxygen? Yes     Baseline Oxygen Saturation % 95 %     1 Minute Oxygen Saturation % 95 %     1 Minute Liters of Oxygen 0 L     2 Minute Oxygen Saturation % 95 %     2 Minute Liters of Oxygen 0 L     3 Minute Oxygen Saturation % 94 %     3 Minute Liters of Oxygen 0 L     4 Minute Oxygen Saturation % 95 %     4 Minute Liters of Oxygen 0 L     5 Minute Oxygen Saturation % 95 %     5 Minute Liters of Oxygen 0 L     6  Minute Oxygen Saturation % 95 %     6 Minute Liters of Oxygen 0 L     2 Minute Post Oxygen Saturation % 95 %     2 Minute Post Liters of Oxygen 0 L              Oxygen Initial Assessment:  Oxygen Initial Assessment - 09/05/21 1308       Home Oxygen   Home Oxygen Device None    Sleep Oxygen Prescription None    Home Exercise Oxygen Prescription None    Home Resting Oxygen Prescription None    Compliance with Home Oxygen Use Yes  Initial 6 min Walk   Oxygen Used None      Program Oxygen Prescription   Program Oxygen Prescription None      Intervention   Short Term Goals To learn and understand importance of maintaining oxygen saturations>88%;To learn and demonstrate proper use of respiratory medications;To learn and understand importance of monitoring SPO2 with pulse oximeter and demonstrate accurate use of the pulse oximeter.    Long  Term Goals Verbalizes importance of monitoring SPO2 with pulse oximeter and return demonstration;Maintenance of O2 saturations>88%;Exhibits proper breathing techniques, such as pursed lip breathing or other method taught during program session;Compliance with respiratory medication             Oxygen Re-Evaluation:   Oxygen Discharge (Final Oxygen Re-Evaluation):   Initial Exercise Prescription:  Initial Exercise Prescription - 09/05/21 1300       Date of Initial Exercise RX and Referring Provider   Date 09/05/21    Referring Provider Dr. Loletta Specter    Expected Discharge Date 11/06/21      Treadmill   MPH 1.2    Grade 0    Minutes 17      Recumbant Elliptical   Level 1    RPM 60    Minutes 22      Prescription Details   Frequency (times per week) 2    Duration Progress to 30 minutes of continuous aerobic without signs/symptoms of physical distress      Intensity   THRR 40-80% of Max Heartrate 58-115    Ratings of Perceived Exertion 11-13    Perceived Dyspnea 0-4      Resistance Training   Training Prescription Yes     Weight 4    Reps 10-15             Perform Capillary Blood Glucose checks as needed.  Exercise Prescription Changes:   Exercise Comments:   Exercise Goals and Review:   Exercise Goals     Row Name 09/05/21 1315             Exercise Goals   Increase Physical Activity Yes       Intervention Provide advice, education, support and counseling about physical activity/exercise needs.;Develop an individualized exercise prescription for aerobic and resistive training based on initial evaluation findings, risk stratification, comorbidities and participant's personal goals.       Expected Outcomes Short Term: Attend rehab on a regular basis to increase amount of physical activity.;Long Term: Add in home exercise to make exercise part of routine and to increase amount of physical activity.;Long Term: Exercising regularly at least 3-5 days a week.       Increase Strength and Stamina Yes       Intervention Provide advice, education, support and counseling about physical activity/exercise needs.;Develop an individualized exercise prescription for aerobic and resistive training based on initial evaluation findings, risk stratification, comorbidities and participant's personal goals.       Expected Outcomes Short Term: Increase workloads from initial exercise prescription for resistance, speed, and METs.;Short Term: Perform resistance training exercises routinely during rehab and add in resistance training at home;Long Term: Improve cardiorespiratory fitness, muscular endurance and strength as measured by increased METs and functional capacity (6MWT)       Able to understand and use rate of perceived exertion (RPE) scale Yes       Intervention Provide education and explanation on how to use RPE scale       Expected Outcomes Short Term: Able to use RPE daily in  rehab to express subjective intensity level;Long Term:  Able to use RPE to guide intensity level when exercising independently        Able to understand and use Dyspnea scale Yes       Intervention Provide education and explanation on how to use Dyspnea scale       Expected Outcomes Short Term: Able to use Dyspnea scale daily in rehab to express subjective sense of shortness of breath during exertion;Long Term: Able to use Dyspnea scale to guide intensity level when exercising independently       Knowledge and understanding of Target Heart Rate Range (THRR) Yes       Intervention Provide education and explanation of THRR including how the numbers were predicted and where they are located for reference       Expected Outcomes Short Term: Able to state/look up THRR;Short Term: Able to use daily as guideline for intensity in rehab;Long Term: Able to use THRR to govern intensity when exercising independently       Understanding of Exercise Prescription Yes       Intervention Provide education, explanation, and written materials on patient's individual exercise prescription       Expected Outcomes Short Term: Able to explain program exercise prescription;Long Term: Able to explain home exercise prescription to exercise independently                Exercise Goals Re-Evaluation :   Discharge Exercise Prescription (Final Exercise Prescription Changes):   Nutrition:  Target Goals: Understanding of nutrition guidelines, daily intake of sodium 1500mg , cholesterol 200mg , calories 30% from fat and 7% or less from saturated fats, daily to have 5 or more servings of fruits and vegetables.  Biometrics:  Pre Biometrics - 09/05/21 1316       Pre Biometrics   Height 6' (1.829 m)    Weight 182 lb 12.2 oz (82.9 kg)    Waist Circumference 40 inches    Hip Circumference 39 inches    Waist to Hip Ratio 1.03 %    BMI (Calculated) 24.78    Triceps Skinfold 10 mm    % Body Fat 24.8 %    Grip Strength 37.8 kg    Flexibility 0 in    Single Leg Stand 2 seconds              Nutrition Therapy Plan and Nutrition Goals:   Nutrition Therapy & Goals - 09/05/21 1312       Intervention Plan   Intervention Nutrition handout(s) given to patient.    Expected Outcomes Short Term Goal: Understand basic principles of dietary content, such as calories, fat, sodium, cholesterol and nutrients.             Nutrition Assessments:  Nutrition Assessments - 09/05/21 1314       MEDFICTS Scores   Pre Score 15            MEDIFICTS Score Key: ?70 Need to make dietary changes  40-70 Heart Healthy Diet ? 40 Therapeutic Level Cholesterol Diet   Picture Your Plate Scores: <00 Unhealthy dietary pattern with much room for improvement. 41-50 Dietary pattern unlikely to meet recommendations for good health and room for improvement. 51-60 More healthful dietary pattern, with some room for improvement.  >60 Healthy dietary pattern, although there may be some specific behaviors that could be improved.    Nutrition Goals Re-Evaluation:   Nutrition Goals Discharge (Final Nutrition Goals Re-Evaluation):   Psychosocial: Target Goals: Acknowledge presence or absence of  significant depression and/or stress, maximize coping skills, provide positive support system. Participant is able to verbalize types and ability to use techniques and skills needed for reducing stress and depression.  Initial Review & Psychosocial Screening:  Initial Psych Review & Screening - 09/05/21 1311       Initial Review   Current issues with None Identified      Family Dynamics   Good Support System? Yes    Comments His support system is his wife.      Barriers   Psychosocial barriers to participate in program There are no identifiable barriers or psychosocial needs.      Screening Interventions   Interventions Encouraged to exercise    Expected Outcomes Long Term goal: The participant improves quality of Life and PHQ9 Scores as seen by post scores and/or verbalization of changes;Short Term goal: Identification and review with  participant of any Quality of Life or Depression concerns found by scoring the questionnaire.             Quality of Life Scores:  Quality of Life - 09/05/21 1316       Quality of Life   Select Quality of Life      Quality of Life Scores   Health/Function Pre 27.56 %    Socioeconomic Pre 30 %    Psych/Spiritual Pre 30 %    Family Pre 30 %    GLOBAL Pre 28.85 %            Scores of 19 and below usually indicate a poorer quality of life in these areas.  A difference of  2-3 points is a clinically meaningful difference.  A difference of 2-3 points in the total score of the Quality of Life Index has been associated with significant improvement in overall quality of life, self-image, physical symptoms, and general health in studies assessing change in quality of life.   PHQ-9: Review Flowsheet  More data exists      09/05/2021 08/29/2021 09/18/2020 08/29/2020 08/25/2019  Depression screen PHQ 2/9  Decreased Interest 0 0 0 0 0  Down, Depressed, Hopeless 0 0 0 0 0  PHQ - 2 Score 0 0 0 0 0  Altered sleeping 0 0 - 0 -  Tired, decreased energy 0 0 - 0 -  Change in appetite 0 0 - 0 -  Feeling bad or failure about yourself  0 0 - 0 -  Trouble concentrating 0 0 - 0 -  Moving slowly or fidgety/restless 0 0 - 0 -  Suicidal thoughts 0 0 - 0 -  PHQ-9 Score 0 0 - 0 -  Difficult doing work/chores - Not difficult at all - Not difficult at all -   Interpretation of Total Score  Total Score Depression Severity:  1-4 = Minimal depression, 5-9 = Mild depression, 10-14 = Moderate depression, 15-19 = Moderately severe depression, 20-27 = Severe depression   Psychosocial Evaluation and Intervention:  Psychosocial Evaluation - 09/05/21 1331       Psychosocial Evaluation & Interventions   Interventions Encouraged to exercise with the program and follow exercise prescription    Comments Pt has no barriers to participating in PR. He has no identifible psychosocial issues. He was recently  diagnosed with lung cancer, stage one in the left lobe and stage three in the right lobe. He is participating in rehab at the advice of his surgeon, and he is set to start chemotherapy on 9/5. He will be taking chemotherapy until the end of  October, and then he will meet with his surgeon again to set a date for his lobectomy. He scored a 0 on his PHQ-9,a nd he reports that he is in good spirits despite his recent diagnosis and upcoming surgery. He did request prayers, and did mention that his daughter passed away at the age of 90 a couple of years ago from cancer. He recently stopped smoking, as he had been smoking a pack of cigarettes per month for the past 20 years. Before that he had smoked a pack per day for about 40 years. He reports that his wife is his support system. His goals for the program are to get stronger and to prepare his body for surgery. He is ready to start the program.    Expected Outcomes Pt will continue to have no identifiable psychosocial issues.    Continue Psychosocial Services  No Follow up required             Psychosocial Re-Evaluation:   Psychosocial Discharge (Final Psychosocial Re-Evaluation):    Education: Education Goals: Education classes will be provided on a weekly basis, covering required topics. Participant will state understanding/return demonstration of topics presented.  Learning Barriers/Preferences:  Learning Barriers/Preferences - 09/05/21 1300       Learning Barriers/Preferences   Learning Barriers None    Learning Preferences Audio             Education Topics: How Lungs Work and Diseases: - Discuss the anatomy of the lungs and diseases that can affect the lungs, such as COPD.   Exercise: -Discuss the importance of exercise, FITT principles of exercise, normal and abnormal responses to exercise, and how to exercise safely.   Environmental Irritants: -Discuss types of environmental irritants and how to limit exposure to  environmental irritants.   Meds/Inhalers and oxygen: - Discuss respiratory medications, definition of an inhaler and oxygen, and the proper way to use an inhaler and oxygen.   Energy Saving Techniques: - Discuss methods to conserve energy and decrease shortness of breath when performing activities of daily living.    Bronchial Hygiene / Breathing Techniques: - Discuss breathing mechanics, pursed-lip breathing technique,  proper posture, effective ways to clear airways, and other functional breathing techniques   Cleaning Equipment: - Provides group verbal and written instruction about the health risks of elevated stress, cause of high stress, and healthy ways to reduce stress.   Nutrition I: Fats: - Discuss the types of cholesterol, what cholesterol does to the body, and how cholesterol levels can be controlled.   Nutrition II: Labels: -Discuss the different components of food labels and how to read food labels.   Respiratory Infections: - Discuss the signs and symptoms of respiratory infections, ways to prevent respiratory infections, and the importance of seeking medical treatment when having a respiratory infection.   Stress I: Signs and Symptoms: - Discuss the causes of stress, how stress may lead to anxiety and depression, and ways to limit stress.   Stress II: Relaxation: -Discuss relaxation techniques to limit stress.   Oxygen for Home/Travel: - Discuss how to prepare for travel when on oxygen and proper ways to transport and store oxygen to ensure safety.   Knowledge Questionnaire Score:  Knowledge Questionnaire Score - 09/05/21 1248       Knowledge Questionnaire Score   Pre Score 15/18             Core Components/Risk Factors/Patient Goals at Admission:  Personal Goals and Risk Factors at Admission - 09/05/21 1317  Core Components/Risk Factors/Patient Goals on Admission   Personal Goal Other Yes    Personal Goal Get stronger in preparation  for upcoming lung surgery.    Intervention Attend pulmonary rehab twice per week and continue to exercise at home.    Expected Outcomes Pt will meet stated goals.             Core Components/Risk Factors/Patient Goals Review:    Core Components/Risk Factors/Patient Goals at Discharge (Final Review):    ITP Comments:   Comments: Patient arrived for 1st visit/orientation/education at 1230. Patient was referred to PR by Dr. Varney Biles due to malignant neoplasm of left upper lobe (C34.12). During orientation advised patient on arrival and appointment times what to wear, what to do before, during and after exercise. Reviewed attendance and class policy.  Pt is scheduled to return pulmonary Rehab on 09/13/2021 at 1330. Pt was advised to come to class 15 minutes before class starts.  Discussed RPE/Dpysnea scales. Patient participated in warm up stretches. Patient was able to complete 6 minute walk test. Patient was measured for the equipment. Discussed equipment safety with patient. Took patient pre-anthropometric measurements. Patient finished visit at 1320.

## 2021-09-06 DIAGNOSIS — C3412 Malignant neoplasm of upper lobe, left bronchus or lung: Secondary | ICD-10-CM | POA: Diagnosis not present

## 2021-09-06 DIAGNOSIS — E079 Disorder of thyroid, unspecified: Secondary | ICD-10-CM | POA: Diagnosis not present

## 2021-09-06 DIAGNOSIS — C3491 Malignant neoplasm of unspecified part of right bronchus or lung: Secondary | ICD-10-CM | POA: Diagnosis not present

## 2021-09-06 DIAGNOSIS — Z1159 Encounter for screening for other viral diseases: Secondary | ICD-10-CM | POA: Diagnosis not present

## 2021-09-06 DIAGNOSIS — C3411 Malignant neoplasm of upper lobe, right bronchus or lung: Secondary | ICD-10-CM | POA: Diagnosis not present

## 2021-09-11 DIAGNOSIS — Z5111 Encounter for antineoplastic chemotherapy: Secondary | ICD-10-CM | POA: Diagnosis not present

## 2021-09-11 DIAGNOSIS — Z5112 Encounter for antineoplastic immunotherapy: Secondary | ICD-10-CM | POA: Diagnosis not present

## 2021-09-11 DIAGNOSIS — C3411 Malignant neoplasm of upper lobe, right bronchus or lung: Secondary | ICD-10-CM | POA: Diagnosis not present

## 2021-09-11 DIAGNOSIS — Z1159 Encounter for screening for other viral diseases: Secondary | ICD-10-CM | POA: Diagnosis not present

## 2021-09-13 ENCOUNTER — Encounter (HOSPITAL_COMMUNITY)
Admission: RE | Admit: 2021-09-13 | Discharge: 2021-09-13 | Disposition: A | Discharge status: Home / Self Care | Payer: Medicare Other | Source: Ambulatory Visit | Attending: Oncology | Admitting: Oncology

## 2021-09-13 DIAGNOSIS — C3412 Malignant neoplasm of upper lobe, left bronchus or lung: Secondary | ICD-10-CM | POA: Insufficient documentation

## 2021-09-13 NOTE — Progress Notes (Signed)
Daily Session Note  Patient Details  Name: Patrick Harris MRN: [id number] Date of Birth: 04/04/1945 Referring Provider:   Flowsheet Row PULMONARY REHAB OTHER RESP ORIENTATION from 09/05/2021 in [facility]  Referring Provider Dr. Clarke       Encounter Date: 09/13/2021  Check In:  Session Check In - 09/13/21 1325       Check-In   Supervising physician immediately available to respond to emergencies [facility] MD immediately available    Physician(s) Dr Branch    Location [facility]    Staff Present Phyllis Billingsley, RN;Heather Jachimiak, BS, Exercise Physiologist;Daphyne Martin, RN, BSN    Virtual Visit No    Medication changes reported     No    Fall or balance concerns reported    Yes    Tobacco Cessation No Change    Warm-up and Cool-down Performed as group-led instruction    Resistance Training Performed Yes    VAD Patient? No    PAD/SET Patient? No      Pain Assessment   Currently in Pain? No/denies    Multiple Pain Sites No             Capillary Blood Glucose: No results found for this or any previous visit (from the past 24 hour(s)).    Social History   Tobacco Use  Smoking Status Some Days   Types: Cigarettes   Last attempt to quit: 01/07/1998   Years since quitting: 23.6  Smokeless Tobacco Never  Tobacco Comments   Smokes "every once in a while"   07/25/21 [facility]     Goals Met:  Independence with exercise equipment Exercise tolerated well No report of concerns or symptoms today Strength training completed today  Goals Unmet:  Not Applicable  Comments: Checkout at 1430.   Dr. Jehanzeb Memon is Medical Director for [facility]. 

## 2021-09-18 ENCOUNTER — Encounter (HOSPITAL_COMMUNITY)
Admission: RE | Admit: 2021-09-18 | Discharge: 2021-09-18 | Disposition: A | Discharge status: Home / Self Care | Payer: Medicare Other | Source: Ambulatory Visit | Attending: Oncology | Admitting: Oncology

## 2021-09-18 VITALS — Wt 179.9 lb

## 2021-09-18 DIAGNOSIS — Z95828 Presence of other vascular implants and grafts: Secondary | ICD-10-CM | POA: Diagnosis not present

## 2021-09-18 DIAGNOSIS — C3412 Malignant neoplasm of upper lobe, left bronchus or lung: Secondary | ICD-10-CM

## 2021-09-18 NOTE — Progress Notes (Signed)
Daily Session Note  Patient Details  Name: Patrick Harris MRN: [id number] Date of Birth: [birth year] Referring Provider:   [place] from 09/05/2021 in [place]  Referring Provider Dr. Loletta Specter       Encounter Date: 09/18/2021  Check In:  Session Check In - 09/18/21 1326       Check-In   Supervising physician immediately available to respond to emergencies [facility] MD immediately available    Physician(s) Dr Domenic Polite    Location [facility]    Staff Present Redge Gainer, BS, Exercise Physiologist;Cherrie Franca Hassell Done, RN, BSN    Virtual Visit No    Medication changes reported     No    Tobacco Cessation No Change    Warm-up and Cool-down Performed as group-led instruction    Resistance Training Performed Yes    VAD Patient? No    PAD/SET Patient? No      Pain Assessment   Currently in Pain? No/denies    Multiple Pain Sites No             Capillary Blood Glucose: No results found for this or any previous visit (from the past 24 hour(s)).    Social History   Tobacco Use  Smoking Status Some Days   Types: Cigarettes   Last attempt to quit: 01/07/1998   Years since quitting: 23.7  Smokeless Tobacco Never  Tobacco Comments   Smokes "every once in a while"   07/25/21 [facility]     Goals Met:  Proper associated with RPD/PD & O2 Sat Independence with exercise equipment Using PLB without cueing & demonstrates good technique Exercise tolerated well Queuing for purse lip breathing No report of concerns or symptoms today Strength training completed today  Goals Unmet:  Not Applicable  Comments: checkout at 1430.   Dr. Kathie Dike is Medical Director for [facility].

## 2021-09-19 ENCOUNTER — Encounter: Payer: Self-pay | Admitting: Gastroenterology

## 2021-09-19 ENCOUNTER — Ambulatory Visit: Payer: Medicare Other | Admitting: Gastroenterology

## 2021-09-19 VITALS — BP 124/70 | HR 77 | Ht 72.0 in | Wt 178.6 lb

## 2021-09-19 DIAGNOSIS — K219 Gastro-esophageal reflux disease without esophagitis: Secondary | ICD-10-CM | POA: Diagnosis not present

## 2021-09-19 DIAGNOSIS — R1013 Epigastric pain: Secondary | ICD-10-CM | POA: Diagnosis not present

## 2021-09-19 NOTE — Progress Notes (Signed)
    Assessment     Epigastric pain GERD Personal history of adenomatous colon polyps Non-small cell lung cancer, recently started chemotherapy at [facility]   Recommendations    Continue omeprazole 40 mg po bid Trial of FDgard 1-2 p.o. 3 times daily as needed Discussed EGD and CT [facility] for further evaluation however he would like to defer further evaluation while he is undergoing chemotherapy unless his symptoms worsen REV in 1 year   HPI    This is a 76 year old male returning for follow-up of persistent mild epigastric pain.  Symptoms have not improved on higher dose of omeprazole.  His symptoms are unchanged with meals or bowel movements.  He relates side effects from bromocriptine so he has stopped.  He read on a Google search about sauerkraut for his symptoms and sauerkraut improved his symptoms temporarily.     Labs / Imaging       Latest Ref Rng & Units 05/25/2021    9:28 AM 08/29/2020    8:58 AM 03/27/2020   11:15 AM  Hepatic Function  Total Protein 6.0 - 8.3 g/dL 7.2  7.3  7.2   Albumin 3.5 - 5.2 g/dL 4.0  4.2  4.4   AST 0 - 37 U/L 16  19  17   ALT 0 - 53 U/L 15  16  17   Alk Phosphatase 39 - 117 U/L 59  47  57   Total Bilirubin 0.2 - 1.2 mg/dL 0.8  0.7  0.5        Latest Ref Rng & Units 07/02/2021    7:19 AM 05/25/2021    9:28 AM 08/29/2020    8:58 AM  CBC  WBC 4.0 - 10.5 K/uL 7.1  5.8  4.6   Hemoglobin 13.0 - 17.0 g/dL 17.3  16.4  17.2   Hematocrit 39.0 - 52.0 % 51.3  48.6  51.6   Platelets 150 - 400 K/uL 162  167.0  158.0     Current Medications, Allergies, Past Medical History, Past Surgical History, Family History and Social History were reviewed in [facility] electronic medical record.   Physical Exam: General: Well developed, well nourished, no acute distress Head: Normocephalic and atraumatic Eyes: Sclerae anicteric, EOMI Ears: Normal auditory acuity Mouth: Not examined Lungs: Clear throughout to auscultation Heart: Regular rate and rhythm; no  murmurs, rubs or bruits Abdomen: Soft, mild generalized abdominal tenderness and non distended. No masses, hepatosplenomegaly or hernias noted. Normal Bowel sounds Rectal: Not done Musculoskeletal: Symmetrical with no gross deformities  Pulses:  Normal pulses noted Extremities: No clubbing, cyanosis, edema or deformities noted Neurological: Alert oriented x 4, grossly nonfocal Psychological:  Alert and cooperative. Normal mood and affect   Malcolm T. Stark, MD 09/19/2021, 9:24 AM  

## 2021-09-19 NOTE — Progress Notes (Signed)
Pulmonary Individual Treatment Plan  Patient Details  Name: Patrick Harris MRN: [id number] Date of Birth: Dec 29, 1945 Referring Provider:   [place] from 09/05/2021 in [place]  Referring Provider Dr. Loletta Specter       Initial Encounter Date:  Flowsheet Row PULMONARY REHAB OTHER RESP ORIENTATION from 09/05/2021 in [place]  Date 09/05/21       Visit Diagnosis: Malignant neoplasm of upper lobe of left lung ([employer])  Patient's Home Medications on Admission:   Current Outpatient Medications:    acetaminophen (TYLENOL) 650 MG CR tablet, Take 1,300 mg by mouth every 8 (eight) hours as needed for pain., Disp: , Rfl:    aspirin EC 81 MG tablet, Take 1 tablet (81 mg total) by mouth at bedtime. Okay to restart this medication on 07/03/2021, Disp: 30 tablet, Rfl: 11   Cholecalciferol (VITAMIN D3) 2000 UNITS TABS, Take 2,000 Units by mouth in the morning and at bedtime., Disp: , Rfl:    Coenzyme Q10 (COQ-10) 100 MG CAPS, Take 100 mg by mouth in the morning., Disp: , Rfl:    Cyanocobalamin (B-12 PO), Take 1 tablet by mouth daily., Disp: , Rfl:    gabapentin (NEURONTIN) 300 MG capsule, Take 1 tablet at bedtime and 2 tablets at bedtime., Disp: 90 capsule, Rfl: 5   Garlic 9381 MG CAPS, Take 1,000 mg by mouth in the morning., Disp: , Rfl:    hydrALAZINE (APRESOLINE) 25 MG tablet, Take 1 tablet (25 mg total) by mouth 3 (three) times daily., Disp: 270 tablet, Rfl: 3   irbesartan (AVAPRO) 300 MG tablet, Take 1 tablet (300 mg total) by mouth daily., Disp: 90 tablet, Rfl: 3   Menaquinone-7 (VITAMIN K2) 100 MCG CAPS, Take 100 mcg by mouth in the morning., Disp: , Rfl:    nitroGLYCERIN (NITROSTAT) 0.4 MG SL tablet, Place 1 tablet (0.4 mg total) under the tongue every 5 (five) minutes x 3 doses as needed for chest pain (if no relief after 2nd dose, proceed to ED or call 911)., Disp: 25 tablet, Rfl: 3   Omega-3 Fatty Acids  (OMEGA-3 FISH OIL PO), Take 1,000 mg by mouth in the morning., Disp: , Rfl:    omeprazole (PRILOSEC) 40 MG capsule, Take 1 capsule (40 mg total) by mouth in the morning and at bedtime. (Patient taking differently: Take 40 mg by mouth daily.), Disp: 60 capsule, Rfl: 11   ondansetron (ZOFRAN) 8 MG tablet, Take 8 mg by mouth every 8 (eight) hours as needed., Disp: , Rfl:    Polyethyl Glycol-Propyl Glycol (LUBRICANT EYE DROPS) 0.4-0.3 % SOLN, Place 1-2 drops into both eyes 3 (three) times daily as needed (dry/irritated eyes.)., Disp: , Rfl:    pyrithione zinc (SELSUN BLUE DRY SCALP) 1 % shampoo, Apply 1 application  topically daily., Disp: , Rfl:    rosuvastatin (CRESTOR) 20 MG tablet, Take 0.5 tablets (10 mg total) by mouth every evening. TAKE HALF A TABLET (10 MG) BY MOUTH ONCE DAILY., Disp: , Rfl:    tamsulosin (FLOMAX) 0.4 MG CAPS capsule, Take 0.4 mg by mouth in the morning., Disp: , Rfl:    Testosterone 20.25 MG/ACT (1.62%) GEL, Apply 2 Pump topically in the morning., Disp: , Rfl:    TURMERIC PO, Take 1,000 mg by mouth in the morning., Disp: , Rfl:    vitamin C (ASCORBIC ACID) 500 MG tablet, Take 500 mg by mouth in the morning., Disp: , Rfl:   Past Medical History: Past  Medical History:  Diagnosis Date   Arthritis    CAD (coronary artery disease)    CABG with LIMA to LAD and RIMA to RCA August 2000 - Dr. Roxan Hockey   Cancer [facility])    basal cell   CHF (congestive heart failure) (HCC)    COPD (chronic obstructive pulmonary disease) (HCC)    Elevated glycated hemoglobin    Essential hypertension    GERD (gastroesophageal reflux disease)    Heart murmur    Hyperlipidemia    Hypopituitarism ([place])    Lung cancer ([place])    Osteopenia    Dr Wilson Singer   Pancreatitis    Peyronie disease    Testosterone deficiency    Dr Wilson Singer    Tobacco Use: Social History   Tobacco Use  Smoking Status Some Days   Types: Cigarettes   Last attempt to quit: 01/07/1998   Years since quitting: 23.7  Smokeless  Tobacco Never  Tobacco Comments   Smokes "every once in a while"   07/25/21 [facility]     Labs: Review Flowsheet  More data exists      Latest Ref Rng & Units 08/22/2017 12/23/2017 08/24/2018 08/25/2019 08/29/2020  Labs for ITP Cardiac and Pulmonary Rehab  Cholestrol 0 - 200 mg/dL 128  137  146  122  118   LDL (calc) 0 - 99 mg/dL 71  81  86  71  65   HDL-C >39.00 mg/dL 29.10  36  34.20  33  33.60   Trlycerides 0.0 - 149.0 mg/dL 140.0  101  130.0  98  96.0   Hemoglobin A1c 4.6 - 6.5 % 6.2  5.9  6.1  5.5  6.0     Capillary Blood Glucose: Lab Results  Component Value Date   GLUCAP 117 (H) 07/23/2021     Pulmonary Assessment Scores:  Pulmonary Assessment Scores     Row Name 09/05/21 1249         ADL UCSD   ADL Phase Entry     SOB Score total 16     Rest 0     Walk 2     Stairs 3     Bath 0     Dress 0     Shop 0       CAT Score   CAT Score 16       mMRC Score   mMRC Score 0             UCSD: Self-administered rating of dyspnea associated with activities of daily living (ADLs) 6-point scale (0 = "not at all" to 5 = "maximal or unable to do because of breathlessness")  Scoring Scores range from 0 to 120.  Minimally important difference is 5 units  CAT: CAT can identify the health impairment of COPD patients and is better correlated with disease progression.  CAT has a scoring range of zero to 40. The CAT score is classified into four groups of low (less than 10), medium (10 - 20), high (21-30) and very high (31-40) based on the impact level of disease on health status. A CAT score over 10 suggests significant symptoms.  A worsening CAT score could be explained by an exacerbation, poor medication adherence, poor inhaler technique, or progression of COPD or comorbid conditions.  CAT MCID is 2 points  mMRC: mMRC (Modified Medical Research Council) Dyspnea Scale is used to assess the degree of baseline functional disability in patients of respiratory disease due to  dyspnea. No minimal important difference is  established. A decrease in score of 1 point or greater is considered a positive change.   Pulmonary Function Assessment:   Exercise Target Goals: Exercise Program Goal: Individual exercise prescription set using results from initial 6 min walk test and THRR while considering  patient's activity barriers and safety.   Exercise Prescription Goal: Initial exercise prescription builds to 30-45 minutes a day of aerobic activity, 2-3 days per week.  Home exercise guidelines will be given to patient during program as part of exercise prescription that the participant will acknowledge.  Activity Barriers & Risk Stratification:  Activity Barriers & Cardiac Risk Stratification - 09/05/21 1310       Activity Barriers & Cardiac Risk Stratification   Activity Barriers Neck/Spine Problems;Joint Problems    Cardiac Risk Stratification High             6 Minute Walk:  6 Minute Walk     Row Name 09/05/21 1311         6 Minute Walk   Phase Initial     Distance 1400 feet     Walk Time 6 minutes     # of Rest Breaks 0     MPH 2.65     METS 2.92     RPE 11     Perceived Dyspnea  11     VO2 Peak 10.24     Symptoms No     Resting HR 68 bpm     Resting BP 132/62     Resting Oxygen Saturation  95 %     Exercise Oxygen Saturation  during 6 min walk 94 %     Max Ex. HR 75 bpm     Max Ex. BP 160/60     2 Minute Post BP 130/60       Interval HR   1 Minute HR 74     2 Minute HR 75     3 Minute HR 74     4 Minute HR 74     5 Minute HR 74     6 Minute HR 75     2 Minute Post HR 65     Interval Heart Rate? Yes       Interval Oxygen   Interval Oxygen? Yes     Baseline Oxygen Saturation % 95 %     1 Minute Oxygen Saturation % 95 %     1 Minute Liters of Oxygen 0 L     2 Minute Oxygen Saturation % 95 %     2 Minute Liters of Oxygen 0 L     3 Minute Oxygen Saturation % 94 %     3 Minute Liters of Oxygen 0 L     4 Minute Oxygen Saturation  % 95 %     4 Minute Liters of Oxygen 0 L     5 Minute Oxygen Saturation % 95 %     5 Minute Liters of Oxygen 0 L     6 Minute Oxygen Saturation % 95 %     6 Minute Liters of Oxygen 0 L     2 Minute Post Oxygen Saturation % 95 %     2 Minute Post Liters of Oxygen 0 L              Oxygen Initial Assessment:  Oxygen Initial Assessment - 09/05/21 1308       Home Oxygen   Home Oxygen Device None    Sleep Oxygen Prescription None  Home Exercise Oxygen Prescription None    Home Resting Oxygen Prescription None    Compliance with Home Oxygen Use Yes      Initial 6 min Walk   Oxygen Used None      Program Oxygen Prescription   Program Oxygen Prescription None      Intervention   Short Term Goals To learn and understand importance of maintaining oxygen saturations>88%;To learn and demonstrate proper use of respiratory medications;To learn and understand importance of monitoring SPO2 with pulse oximeter and demonstrate accurate use of the pulse oximeter.    Long  Term Goals Verbalizes importance of monitoring SPO2 with pulse oximeter and return demonstration;Maintenance of O2 saturations>88%;Exhibits proper breathing techniques, such as pursed lip breathing or other method taught during program session;Compliance with respiratory medication             Oxygen Re-Evaluation:  Oxygen Re-Evaluation     [place] Name 09/18/21 1428             Program Oxygen Prescription   Program Oxygen Prescription None         Home Oxygen   Home Oxygen Device None       Sleep Oxygen Prescription None       Home Exercise Oxygen Prescription None       Home Resting Oxygen Prescription None       Compliance with Home Oxygen Use Yes         Goals/Expected Outcomes   Short Term Goals To learn and understand importance of maintaining oxygen saturations>88%;To learn and demonstrate proper use of respiratory medications;To learn and understand importance of monitoring SPO2 with pulse oximeter  and demonstrate accurate use of the pulse oximeter.       Goals/Expected Outcomes compliance                Oxygen Discharge (Final Oxygen Re-Evaluation):  Oxygen Re-Evaluation - 09/18/21 1428       Program Oxygen Prescription   Program Oxygen Prescription None      Home Oxygen   Home Oxygen Device None    Sleep Oxygen Prescription None    Home Exercise Oxygen Prescription None    Home Resting Oxygen Prescription None    Compliance with Home Oxygen Use Yes      Goals/Expected Outcomes   Short Term Goals To learn and understand importance of maintaining oxygen saturations>88%;To learn and demonstrate proper use of respiratory medications;To learn and understand importance of monitoring SPO2 with pulse oximeter and demonstrate accurate use of the pulse oximeter.    Goals/Expected Outcomes compliance             Initial Exercise Prescription:  Initial Exercise Prescription - 09/05/21 1300       Date of Initial Exercise RX and Referring Provider   Date 09/05/21    Referring Provider Dr. Loletta Specter    Expected Discharge Date 11/06/21      Treadmill   MPH 1.2    Grade 0    Minutes 17      Recumbant Elliptical   Level 1    RPM 60    Minutes 22      Prescription Details   Frequency (times per week) 2    Duration Progress to 30 minutes of continuous aerobic without signs/symptoms of physical distress      Intensity   THRR 40-80% of Max Heartrate 58-115    Ratings of Perceived Exertion 11-13    Perceived Dyspnea 0-4      Resistance Training  Training Prescription Yes    Weight 4    Reps 10-15             Perform Capillary Blood Glucose checks as needed.  Exercise Prescription Changes:   Exercise Prescription Changes     Row Name 09/18/21 1400             Response to Exercise   Blood Pressure (Admit) 140/64       Blood Pressure (Exercise) 128/62       Blood Pressure (Exit) 120/62       Heart Rate (Admit) 68 bpm       Heart Rate (Exercise) 92  bpm       Heart Rate (Exit) 78 bpm       Oxygen Saturation (Admit) 93 %       Oxygen Saturation (Exercise) 93 %       Oxygen Saturation (Exit) 93 %       Rating of Perceived Exertion (Exercise) 11       Perceived Dyspnea (Exercise) 11       Duration Continue with 30 min of aerobic exercise without signs/symptoms of physical distress.       Intensity THRR unchanged         Progression   Progression Continue to progress workloads to maintain intensity without signs/symptoms of physical distress.         Resistance Training   Training Prescription Yes       Weight 4       Reps 10-15       Time 10 Minutes         Treadmill   MPH 1.8       Grade 0       Minutes 17       METs 2.38         Recumbant Elliptical   Level 1       RPM 53       Minutes 22       METs 3.3                Exercise Comments:   Exercise Goals and Review:   Exercise Goals     Row Name 09/05/21 1315 09/18/21 1425           Exercise Goals   Increase Physical Activity Yes Yes      Intervention Provide advice, education, support and counseling about physical activity/exercise needs.;Develop an individualized exercise prescription for aerobic and resistive training based on initial evaluation findings, risk stratification, comorbidities and participant's personal goals. Provide advice, education, support and counseling about physical activity/exercise needs.;Develop an individualized exercise prescription for aerobic and resistive training based on initial evaluation findings, risk stratification, comorbidities and participant's personal goals.      Expected Outcomes Short Term: Attend rehab on a regular basis to increase amount of physical activity.;Long Term: Add in home exercise to make exercise part of routine and to increase amount of physical activity.;Long Term: Exercising regularly at least 3-5 days a week. Short Term: Attend rehab on a regular basis to increase amount of physical activity.;Long  Term: Add in home exercise to make exercise part of routine and to increase amount of physical activity.;Long Term: Exercising regularly at least 3-5 days a week.      Increase Strength and Stamina Yes Yes      Intervention Provide advice, education, support and counseling about physical activity/exercise needs.;Develop an individualized exercise prescription for aerobic and resistive training based on initial evaluation  findings, risk stratification, comorbidities and participant's personal goals. Provide advice, education, support and counseling about physical activity/exercise needs.;Develop an individualized exercise prescription for aerobic and resistive training based on initial evaluation findings, risk stratification, comorbidities and participant's personal goals.      Expected Outcomes Short Term: Increase workloads from initial exercise prescription for resistance, speed, and METs.;Short Term: Perform resistance training exercises routinely during rehab and add in resistance training at home;Long Term: Improve cardiorespiratory fitness, muscular endurance and strength as measured by increased METs and functional capacity (6MWT) Short Term: Increase workloads from initial exercise prescription for resistance, speed, and METs.;Short Term: Perform resistance training exercises routinely during rehab and add in resistance training at home;Long Term: Improve cardiorespiratory fitness, muscular endurance and strength as measured by increased METs and functional capacity (6MWT)      Able to understand and use rate of perceived exertion (RPE) scale Yes Yes      Intervention Provide education and explanation on how to use RPE scale Provide education and explanation on how to use RPE scale      Expected Outcomes Short Term: Able to use RPE daily in rehab to express subjective intensity level;Long Term:  Able to use RPE to guide intensity level when exercising independently Short Term: Able to use RPE daily in  rehab to express subjective intensity level;Long Term:  Able to use RPE to guide intensity level when exercising independently      Able to understand and use Dyspnea scale Yes Yes      Intervention Provide education and explanation on how to use Dyspnea scale Provide education and explanation on how to use Dyspnea scale      Expected Outcomes Short Term: Able to use Dyspnea scale daily in rehab to express subjective sense of shortness of breath during exertion;Long Term: Able to use Dyspnea scale to guide intensity level when exercising independently Short Term: Able to use Dyspnea scale daily in rehab to express subjective sense of shortness of breath during exertion;Long Term: Able to use Dyspnea scale to guide intensity level when exercising independently      Knowledge and understanding of Target Heart Rate Range (THRR) Yes Yes      Intervention Provide education and explanation of THRR including how the numbers were predicted and where they are located for reference Provide education and explanation of THRR including how the numbers were predicted and where they are located for reference      Expected Outcomes Short Term: Able to state/look up THRR;Short Term: Able to use daily as guideline for intensity in rehab;Long Term: Able to use THRR to govern intensity when exercising independently Short Term: Able to state/look up THRR;Short Term: Able to use daily as guideline for intensity in rehab;Long Term: Able to use THRR to govern intensity when exercising independently      Understanding of Exercise Prescription Yes Yes      Intervention Provide education, explanation, and written materials on patient's individual exercise prescription Provide education, explanation, and written materials on patient's individual exercise prescription      Expected Outcomes Short Term: Able to explain program exercise prescription;Long Term: Able to explain home exercise prescription to exercise independently Short  Term: Able to explain program exercise prescription;Long Term: Able to explain home exercise prescription to exercise independently               Exercise Goals Re-Evaluation :  Exercise Goals Re-Evaluation     [place] Name 09/18/21 1425  Exercise Goal Re-Evaluation   Exercise Goals Review Increase Physical Activity;Increase Strength and Stamina;Able to understand and use rate of perceived exertion (RPE) scale;Able to understand and use Dyspnea scale;Knowledge and understanding of Target Heart Rate Range (THRR);Understanding of Exercise Prescription       Comments Pt has completed 2 sessions of PR. He seems to enjoy coming to class so far and is eager to get going. He is increasing his speed on the treadmill and RPM on the ellp. He has a bike at home that he rides for 20 minutes a day along with doing weights and bands. He is currenlty exercising at 3.3 METs on the ellp. Will continue to monitor and progress as able.       Expected Outcomes Through exercise at home and at rehab, the patient will meet their stated goals.                Discharge Exercise Prescription (Final Exercise Prescription Changes):  Exercise Prescription Changes - 09/18/21 1400       Response to Exercise   Blood Pressure (Admit) 140/64    Blood Pressure (Exercise) 128/62    Blood Pressure (Exit) 120/62    Heart Rate (Admit) 68 bpm    Heart Rate (Exercise) 92 bpm    Heart Rate (Exit) 78 bpm    Oxygen Saturation (Admit) 93 %    Oxygen Saturation (Exercise) 93 %    Oxygen Saturation (Exit) 93 %    Rating of Perceived Exertion (Exercise) 11    Perceived Dyspnea (Exercise) 11    Duration Continue with 30 min of aerobic exercise without signs/symptoms of physical distress.    Intensity THRR unchanged      Progression   Progression Continue to progress workloads to maintain intensity without signs/symptoms of physical distress.      Resistance Training   Training Prescription Yes    Weight 4     Reps 10-15    Time 10 Minutes      Treadmill   MPH 1.8    Grade 0    Minutes 17    METs 2.38      Recumbant Elliptical   Level 1    RPM 53    Minutes 22    METs 3.3             Nutrition:  Target Goals: Understanding of nutrition guidelines, daily intake of sodium 1500mg , cholesterol 200mg , calories 30% from fat and 7% or less from saturated fats, daily to have 5 or more servings of fruits and vegetables.  Biometrics:  Pre Biometrics - 09/05/21 1316       Pre Biometrics   Height 6' (1.829 m)    Weight 82.9 kg    Waist Circumference 40 inches    Hip Circumference 39 inches    Waist to Hip Ratio 1.03 %    BMI (Calculated) 24.78    Triceps Skinfold 10 mm    % Body Fat 24.8 %    Grip Strength 37.8 kg    Flexibility 0 in    Single Leg Stand 2 seconds              Nutrition Therapy Plan and Nutrition Goals:  Nutrition Therapy & Goals - 09/19/21 1137       Personal Nutrition Goals   Comments Patient scored 15 on his diet assessment. We offer 2 educational sessions on heart heatlhy nutrition with handouts.      Intervention Plan   Intervention Nutrition handout(s) given  to patient.    Expected Outcomes Short Term Goal: Understand basic principles of dietary content, such as calories, fat, sodium, cholesterol and nutrients.             Nutrition Assessments:  Nutrition Assessments - 09/05/21 1314       MEDFICTS Scores   Pre Score 15            MEDIFICTS Score Key: ?70 Need to make dietary changes  40-70 Heart Healthy Diet ? 40 Therapeutic Level Cholesterol Diet   Picture Your Plate Scores: <81 Unhealthy dietary pattern with much room for improvement. 41-50 Dietary pattern unlikely to meet recommendations for good health and room for improvement. 51-60 More healthful dietary pattern, with some room for improvement.  >60 Healthy dietary pattern, although there may be some specific behaviors that could be improved.    Nutrition Goals  Re-Evaluation:   Nutrition Goals Discharge (Final Nutrition Goals Re-Evaluation):   Psychosocial: Target Goals: Acknowledge presence or absence of significant depression and/or stress, maximize coping skills, provide positive support system. Participant is able to verbalize types and ability to use techniques and skills needed for reducing stress and depression.  Initial Review & Psychosocial Screening:  Initial Psych Review & Screening - 09/05/21 1311       Initial Review   Current issues with None Identified      Family Dynamics   Good Support System? Yes    Comments His support system is his wife.      Barriers   Psychosocial barriers to participate in program There are no identifiable barriers or psychosocial needs.      Screening Interventions   Interventions Encouraged to exercise    Expected Outcomes Long Term goal: The participant improves quality of Life and PHQ9 Scores as seen by post scores and/or verbalization of changes;Short Term goal: Identification and review with participant of any Quality of Life or Depression concerns found by scoring the questionnaire.             Quality of Life Scores:  Quality of Life - 09/05/21 1316       Quality of Life   Select Quality of Life      Quality of Life Scores   Health/Function Pre 27.56 %    Socioeconomic Pre 30 %    Psych/Spiritual Pre 30 %    Family Pre 30 %    GLOBAL Pre 28.85 %            Scores of 19 and below usually indicate a poorer quality of life in these areas.  A difference of  2-3 points is a clinically meaningful difference.  A difference of 2-3 points in the total score of the Quality of Life Index has been associated with significant improvement in overall quality of life, self-image, physical symptoms, and general health in studies assessing change in quality of life.   PHQ-9: Review Flowsheet  More data exists      09/05/2021 08/29/2021 09/18/2020 08/29/2020 08/25/2019  Depression screen  PHQ 2/9  Decreased Interest 0 0 0 0 0  Down, Depressed, Hopeless 0 0 0 0 0  PHQ - 2 Score 0 0 0 0 0  Altered sleeping 0 0 - 0 -  Tired, decreased energy 0 0 - 0 -  Change in appetite 0 0 - 0 -  Feeling bad or failure about yourself  0 0 - 0 -  Trouble concentrating 0 0 - 0 -  Moving slowly or fidgety/restless 0 0 -  0 -  Suicidal thoughts 0 0 - 0 -  PHQ-9 Score 0 0 - 0 -  Difficult doing work/chores - Not difficult at all - Not difficult at all -   Interpretation of Total Score  Total Score Depression Severity:  1-4 = Minimal depression, 5-9 = Mild depression, 10-14 = Moderate depression, 15-19 = Moderately severe depression, 20-27 = Severe depression   Psychosocial Evaluation and Intervention:  Psychosocial Evaluation - 09/05/21 1331       Psychosocial Evaluation & Interventions   Interventions Encouraged to exercise with the program and follow exercise prescription    Comments Pt has no barriers to participating in PR. He has no identifible psychosocial issues. He was recently diagnosed with lung cancer, stage one in the left lobe and stage three in the right lobe. He is participating in rehab at the advice of his surgeon, and he is set to start chemotherapy on 9/5. He will be taking chemotherapy until the end of October, and then he will meet with his surgeon again to set a date for his lobectomy. He scored a 0 on his PHQ-9,a nd he reports that he is in good spirits despite his recent diagnosis and upcoming surgery. He did request prayers, and did mention that his daughter passed away at the age of 78 a couple of years ago from cancer. He recently stopped smoking, as he had been smoking a pack of cigarettes per month for the past 20 years. Before that he had smoked a pack per day for about 40 years. He reports that his wife is his support system. His goals for the program are to get stronger and to prepare his body for surgery. He is ready to start the program.    Expected Outcomes Pt will  continue to have no identifiable psychosocial issues.    Continue Psychosocial Services  No Follow up required             Psychosocial Re-Evaluation:  Psychosocial Re-Evaluation     [place] Name 09/19/21 1133             Psychosocial Re-Evaluation   Current issues with None Identified       Comments Patient is new to the program completing 3 sessions. He continues to have no psychosocial barriers or issues identified. We will continue to monitor.       Expected Outcomes Patient will continue to have no psychosocial barriers or issues identified.       Interventions Encouraged to attend Pulmonary Rehabilitation for the exercise;Relaxation education;Encouraged to attend Cardiac Rehabilitation for the exercise       Continue Psychosocial Services  No Follow up required                Psychosocial Discharge (Final Psychosocial Re-Evaluation):  Psychosocial Re-Evaluation - 09/19/21 1133       Psychosocial Re-Evaluation   Current issues with None Identified    Comments Patient is new to the program completing 3 sessions. He continues to have no psychosocial barriers or issues identified. We will continue to monitor.    Expected Outcomes Patient will continue to have no psychosocial barriers or issues identified.    Interventions Encouraged to attend Pulmonary Rehabilitation for the exercise;Relaxation education;Encouraged to attend Cardiac Rehabilitation for the exercise    Continue Psychosocial Services  No Follow up required              Education: Education Goals: Education classes will be provided on a weekly basis, covering required topics.  Participant will state understanding/return demonstration of topics presented.  Learning Barriers/Preferences:  Learning Barriers/Preferences - 09/05/21 1300       Learning Barriers/Preferences   Learning Barriers None    Learning Preferences Audio             Education Topics: How Lungs Work and Diseases: - Discuss  the anatomy of the lungs and diseases that can affect the lungs, such as COPD.   Exercise: -Discuss the importance of exercise, FITT principles of exercise, normal and abnormal responses to exercise, and how to exercise safely.   Environmental Irritants: -Discuss types of environmental irritants and how to limit exposure to environmental irritants.   Meds/Inhalers and oxygen: - Discuss respiratory medications, definition of an inhaler and oxygen, and the proper way to use an inhaler and oxygen.   Energy Saving Techniques: - Discuss methods to conserve energy and decrease shortness of breath when performing activities of daily living.    Bronchial Hygiene / Breathing Techniques: - Discuss breathing mechanics, pursed-lip breathing technique,  proper posture, effective ways to clear airways, and other functional breathing techniques   Cleaning Equipment: - Provides group verbal and written instruction about the health risks of elevated stress, cause of high stress, and healthy ways to reduce stress.   Nutrition I: Fats: - Discuss the types of cholesterol, what cholesterol does to the body, and how cholesterol levels can be controlled.   Nutrition II: Labels: -Discuss the different components of food labels and how to read food labels.   Respiratory Infections: - Discuss the signs and symptoms of respiratory infections, ways to prevent respiratory infections, and the importance of seeking medical treatment when having a respiratory infection. Flowsheet Row PULMONARY REHAB OTHER RESPIRATORY from 09/13/2021 in [place]  Date 09/13/21  Educator DM  Instruction Review Code 1- Verbalizes Understanding       Stress I: Signs and Symptoms: - Discuss the causes of stress, how stress may lead to anxiety and depression, and ways to limit stress.   Stress II: Relaxation: -Discuss relaxation techniques to limit stress.   Oxygen for Home/Travel: - Discuss  how to prepare for travel when on oxygen and proper ways to transport and store oxygen to ensure safety.   Knowledge Questionnaire Score:  Knowledge Questionnaire Score - 09/05/21 1248       Knowledge Questionnaire Score   Pre Score 15/18             Core Components/Risk Factors/Patient Goals at Admission:  Personal Goals and Risk Factors at Admission - 09/05/21 1317       Core Components/Risk Factors/Patient Goals on Admission   Personal Goal Other Yes    Personal Goal Get stronger in preparation for upcoming lung surgery.    Intervention Attend pulmonary rehab twice per week and continue to exercise at home.    Expected Outcomes Pt will meet stated goals.             Core Components/Risk Factors/Patient Goals Review:   Goals and Risk Factor Review     Row Name 09/19/21 1134             Core Components/Risk Factors/Patient Goals Review   Personal Goals Review Other       Review Patient was referred to [facility] with malignant neoplasm of Left upper lobe of lung. He has completed 3 sessions and is doing well in the program with consistent attendance. His personal goals for the program are to get stronger and prepare his lungs for  surgery for cancer. He is currently receiving chemotherapy. We will continue to monitor his progress as he works towards meeting these personal goals.       Expected Outcomes Patient will complete the program meeting both personal and program goals.                Core Components/Risk Factors/Patient Goals at Discharge (Final Review):   Goals and Risk Factor Review - 09/19/21 1134       Core Components/Risk Factors/Patient Goals Review   Personal Goals Review Other    Review Patient was referred to [facility] with malignant neoplasm of Left upper lobe of lung. He has completed 3 sessions and is doing well in the program with consistent attendance. His personal goals for the program are to get stronger and prepare his lungs for surgery for cancer.  He is currently receiving chemotherapy. We will continue to monitor his progress as he works towards meeting these personal goals.    Expected Outcomes Patient will complete the program meeting both personal and program goals.             ITP Comments:   Comments: ITP REVIEW Pt is making expected progress toward pulmonary rehab goals after completing 3 sessions. Recommend continued exercise, life style modification, education, and utilization of breathing techniques to increase stamina and strength and decrease shortness of breath with exertion.

## 2021-09-19 NOTE — Patient Instructions (Signed)
Start FDgard samples taking 1-2 capsules by mouth three times a day as needed.   The [facility] providers would like to encourage you to use [facility] to communicate with providers for non-urgent requests or questions.  Due to long hold times on the telephone, sending your provider a message by [facility] may be a faster and more efficient way to get a response.  Please allow 48 business hours for a response.  Please remember that this is for non-urgent requests.   Thank you for choosing me and [facility].  Pricilla Riffle. Dagoberto Ligas., MD., [facility] Regal

## 2021-09-20 ENCOUNTER — Encounter (HOSPITAL_COMMUNITY)
Admission: RE | Admit: 2021-09-20 | Discharge: 2021-09-20 | Disposition: A | Discharge status: Home / Self Care | Payer: Medicare Other | Source: Ambulatory Visit | Attending: Oncology | Admitting: Oncology

## 2021-09-20 DIAGNOSIS — C3412 Malignant neoplasm of upper lobe, left bronchus or lung: Secondary | ICD-10-CM

## 2021-09-20 NOTE — Progress Notes (Signed)
Daily Session Note  Patient Details  Name: Patrick Harris MRN: [id number] Date of Birth: 1945-03-26 Referring Provider:   Flowsheet Row PULMONARY REHAB OTHER RESP ORIENTATION from 09/05/2021 in [place]  Referring Provider Dr. Loletta Specter       Encounter Date: 09/20/2021  Check In:  Session Check In - 09/20/21 1330       Check-In   Supervising physician immediately available to respond to emergencies [facility] MD immediately available    Physician(s) Dr Johney Frame    Location [facility]    Staff Present Redge Gainer, BS, Exercise Physiologist;Linley Moskal Hassell Done, RN, BSN    Virtual Visit No    Medication changes reported     No    Fall or balance concerns reported    Yes    Tobacco Cessation No Change    Warm-up and Cool-down Performed as group-led instruction    Resistance Training Performed Yes    VAD Patient? No    PAD/SET Patient? No      Pain Assessment   Currently in Pain? No/denies    Multiple Pain Sites No             Capillary Blood Glucose: No results found for this or any previous visit (from the past 24 hour(s)).    Social History   Tobacco Use  Smoking Status Some Days   Types: Cigarettes   Last attempt to quit: 01/07/1998   Years since quitting: 23.7  Smokeless Tobacco Never  Tobacco Comments   Smokes "every once in a while"   07/25/21 [facility]     Goals Met:  Proper associated with RPD/PD & O2 Sat Independence with exercise equipment Using PLB without cueing & demonstrates good technique Exercise tolerated well Queuing for purse lip breathing No report of concerns or symptoms today Strength training completed today  Goals Unmet:  Not Applicable  Comments: Checkout at 1430.   Dr. Kathie Dike is Medical Director for [facility].

## 2021-09-23 ENCOUNTER — Other Ambulatory Visit: Payer: Self-pay | Admitting: Neurology

## 2021-09-24 ENCOUNTER — Ambulatory Visit (INDEPENDENT_AMBULATORY_CARE_PROVIDER_SITE_OTHER): Payer: Medicare Other

## 2021-09-24 VITALS — Ht 72.0 in | Wt 178.0 lb

## 2021-09-24 DIAGNOSIS — Z0000 Encounter for general adult medical examination without abnormal findings: Secondary | ICD-10-CM

## 2021-09-24 NOTE — Progress Notes (Signed)
Subjective:   Patrick Harris is a 76 y.o. male who presents for Medicare Annual/Subsequent preventive examination.   Virtual Visit via Telephone Note  I connected with  Patrick Harris on 09/24/21 at  8:15 AM EDT by telephone and verified that I am speaking with the correct person using two identifiers.  Location: Patient: home  Provider: [facility]  Persons participating in the virtual visit: patient/Nurse Health Advisor   I discussed the limitations, risks, security and privacy concerns of performing an evaluation and management service by telephone and the availability of in person appointments. The patient expressed understanding and agreed to proceed.  Interactive audio and video telecommunications were attempted between this nurse and patient, however failed, due to patient having technical difficulties OR patient did not have access to video capability.  We continued and completed visit with audio only.  Some vital signs may be absent or patient reported.   Daphane Shepherd, LPN  Review of Systems     Cardiac Risk Factors include: advanced age (>104men, >62 women);dyslipidemia;male gender;hypertension     Objective:    Today's Vitals   09/24/21 0818  Weight: 178 lb (80.7 kg)  Height: 6' (1.829 m)   Body mass index is 24.14 kg/m.     09/24/2021    8:22 AM 07/02/2021    7:14 AM 01/31/2021    7:54 AM 10/25/2020    8:16 AM 09/18/2020    9:55 AM 07/20/2018    8:13 AM 01/25/2018    1:44 PM  Advanced Directives  Does Patient Have a Medical Advance Directive? Yes Yes Yes Yes Yes Yes Yes  Type of Paramedic of [place];Living will [place];Living will [place];Out of facility DNR (pink MOST or yellow form);Living will [place];Living will;Out of facility DNR (pink MOST or yellow form) Living will;Healthcare Power of [place];Living will [place];Living will  Does patient want to make changes to medical advance directive?     No - Patient declined    Copy of [place] in Chart? No - copy requested No - copy requested   No - copy requested No - copy requested     Current Medications (verified) Outpatient Encounter Medications as of 09/24/2021  Medication Sig   acetaminophen (TYLENOL) 650 MG CR tablet Take 1,300 mg by mouth every 8 (eight) hours as needed for pain.   aspirin EC 81 MG tablet Take 1 tablet (81 mg total) by mouth at bedtime. Okay to restart this medication on 07/03/2021   Cholecalciferol (VITAMIN D3) 2000 UNITS TABS Take 2,000 Units by mouth in the morning and at bedtime.   Coenzyme Q10 (COQ-10) 100 MG CAPS Take 100 mg by mouth in the morning.   Cyanocobalamin (B-12 PO) Take 1 tablet by mouth daily.   gabapentin (NEURONTIN) 300 MG capsule Take 1 tablet at bedtime and 2 tablets at bedtime.   Garlic 5462 MG CAPS Take 1,000 mg by mouth in the morning.   hydrALAZINE (APRESOLINE) 25 MG tablet Take 1 tablet (25 mg total) by mouth 3 (three) times daily.   irbesartan (AVAPRO) 300 MG tablet Take 1 tablet (300 mg total) by mouth daily.   Menaquinone-7 (VITAMIN K2) 100 MCG CAPS Take 100 mcg by mouth in the morning.   nitroGLYCERIN (NITROSTAT) 0.4 MG SL tablet Place 1 tablet (0.4 mg total) under the tongue every 5 (five) minutes x 3 doses as needed for chest  pain (if no relief after 2nd dose, proceed to ED or call 911).   Omega-3 Fatty Acids (OMEGA-3 FISH OIL PO) Take 1,000 mg by mouth in the morning.   omeprazole (PRILOSEC) 40 MG capsule Take 1 capsule (40 mg total) by mouth in the morning and at bedtime. (Patient taking differently: Take 40 mg by mouth daily.)   ondansetron (ZOFRAN) 8 MG tablet Take 8 mg by mouth every 8 (eight) hours as needed.   Polyethyl Glycol-Propyl Glycol (LUBRICANT EYE DROPS) 0.4-0.3 % SOLN Place 1-2 drops into both eyes 3 (three) times daily as needed (dry/irritated eyes.).    pyrithione zinc (SELSUN BLUE DRY SCALP) 1 % shampoo Apply 1 application  topically daily.   rosuvastatin (CRESTOR) 20 MG tablet Take 0.5 tablets (10 mg total) by mouth every evening. TAKE HALF A TABLET (10 MG) BY MOUTH ONCE DAILY.   tamsulosin (FLOMAX) 0.4 MG CAPS capsule Take 0.4 mg by mouth in the morning.   Testosterone 20.25 MG/ACT (1.62%) GEL Apply 2 Pump topically in the morning.   TURMERIC PO Take 1,000 mg by mouth in the morning.   vitamin C (ASCORBIC ACID) 500 MG tablet Take 500 mg by mouth in the morning.   No facility-administered encounter medications on file as of 09/24/2021.    Allergies (verified) Latex, Bromocriptine, Zestril [lisinopril], Diovan [valsartan], Hydrochlorothiazide, Norvasc [amlodipine], and Zocor [simvastatin]   History: Past Medical History:  Diagnosis Date   Arthritis    CAD (coronary artery disease)    CABG with LIMA to LAD and RIMA to RCA August 2000 - Dr. Roxan Hockey   Cancer [facility])    basal cell   CHF (congestive heart failure) (HCC)    COPD (chronic obstructive pulmonary disease) (HCC)    Elevated glycated hemoglobin    Essential hypertension    GERD (gastroesophageal reflux disease)    Heart murmur    Hyperlipidemia    Hypopituitarism ([place])    Lung cancer (HCC)    Osteopenia    Dr Wilson Singer   Pancreatitis    [place] disease    Testosterone deficiency    Dr Wilson Singer   Past Surgical History:  Procedure Laterality Date   Bone spur  2006   5th toe bilaterally   BRONCHIAL BIOPSY  07/02/2021   Procedure: BRONCHIAL BIOPSIES;  Surgeon: Collene Gobble, MD;  Location[facility] ENDOSCOPY;  Service: Pulmonary;;   BRONCHIAL BRUSHINGS  07/02/2021   Procedure: BRONCHIAL BRUSHINGS;  Surgeon: Collene Gobble, MD;  Location: [place];  Service: Pulmonary;;   BRONCHIAL NEEDLE ASPIRATION BIOPSY  07/02/2021   Procedure: BRONCHIAL NEEDLE ASPIRATION BIOPSIES;  Surgeon: Collene Gobble, MD;  Location: [place];  Service: Pulmonary;;   COLONOSCOPY  2007    negative; Dr Sharlett Iles   CORONARY ARTERY BYPASS GRAFT  2001   2 vessels   FIDUCIAL MARKER PLACEMENT  07/02/2021   Procedure: FIDUCIAL MARKER PLACEMENT;  Surgeon: Collene Gobble, MD;  Location: [facility] ENDOSCOPY;  Service: Pulmonary;;   fracture heel  1967   Bil/ due to fall off ladder   Fractured calcaneus  1969   bilaterally w/ ankle fusion   [place]   right inguinal hernia   KNEE SURGERY Left    MENISECTOMY  2007   R medial, Dr. Jeannetta Nap CATH INSERTION     SHOULDER SURGERY  1994   left   UPPER GASTROINTESTINAL ENDOSCOPY  2007   GERD, Dr Sharlett Iles   VASECTOMY     VIDEO BRONCHOSCOPY WITH RADIAL ENDOBRONCHIAL  ULTRASOUND  07/02/2021   Procedure: VIDEO BRONCHOSCOPY WITH RADIAL ENDOBRONCHIAL ULTRASOUND;  Surgeon: Collene Gobble, MD;  Location: [facility] ENDOSCOPY;  Service: Pulmonary;;   Family History  Problem Relation Age of Onset   Heart attack Father 60   Diabetes Mother    Stroke Mother 38   Hypertension Mother    Leukemia Sister    Colon cancer Paternal Uncle    Aortic aneurysm Brother        abdominal   Heart disease Brother    Atrial fibrillation Brother    Cancer Maternal Uncle         X3:prostate , renal, bone    Parkinson's disease Sister    Stroke Sister    Stroke Sister    Stroke Sister    Atrial fibrillation Sister    Esophageal cancer Neg Hx    Rectal cancer Neg Hx    Stomach cancer Neg Hx    Social History   Socioeconomic History   Marital status: Married    Spouse name: Not on file   Number of children: 1   Years of education: Not on file   Highest education level: Not on file  Occupational History   Occupation: retired  Tobacco Use   Smoking status: Some Days    Types: Cigarettes    Last attempt to quit: 01/07/1998    Years since quitting: 23.7   Smokeless tobacco: Never   Tobacco comments:    Smokes "every once in a while"   07/25/21 ARJ   Vaping Use   Vaping Use: Never used  Substance and Sexual Activity   Alcohol use: No   Drug  use: No   Sexual activity: Yes  Other Topics Concern   Not on file  Social History Narrative   Patients daughter passed away at 48 from Breast Cancer.       Right Handed    Lives in two story home    Social Determinants of Health   Financial Resource Strain: Low Risk  (09/24/2021)   Overall Financial Resource Strain (CARDIA)    Difficulty of Paying Living Expenses: Not hard at all  Food Insecurity: No Food Insecurity (09/24/2021)   Hunger Vital Sign    Worried About Running Out of Food in the Last Year: Never true    Ran Out of Food in the Last Year: Never true  Transportation Needs: No Transportation Needs (09/24/2021)   PRAPARE - Hydrologist (Medical): No    Lack of Transportation (Non-Medical): No  Physical Activity: Insufficiently Active (09/24/2021)   Exercise Vital Sign    Days of Exercise per Week: 3 days    Minutes of Exercise per Session: 30 min  Stress: No Stress Concern Present (09/24/2021)   [place]    Feeling of Stress : Not at all  Social Connections: Moderately Integrated (09/24/2021)   Social Connection and Isolation Panel [NHANES]    Frequency of Communication with Friends and Family: More than three times a week    Frequency of Social Gatherings with Friends and Family: More than three times a week    Attends Religious Services: More than 4 times per year    Active Member of [employer] or Organizations: No    Attends Archivist Meetings: Never    Marital Status: Married    Tobacco Counseling Ready to quit: Not Answered Counseling given: Not Answered Tobacco comments: Smokes "every once in a while"  07/25/21 ARJ    Clinical Intake:  Pre-visit preparation completed: Yes  Pain : No/denies pain     Nutritional Risks: None Diabetes: No  How often do you need to have someone help you when you read instructions, pamphlets, or other written materials from  your doctor or pharmacy?: 1 - Never  Diabetic?no   Interpreter Needed?: No  Information entered by :: Jadene Pierini, LPN   Activities of Daily Living    09/24/2021    8:22 AM  In your present state of health, do you have any difficulty performing the following activities:  Hearing? 0  Vision? 0  Difficulty concentrating or making decisions? 0  Walking or climbing stairs? 0  Dressing or bathing? 0  Doing errands, shopping? 0  Preparing Food and eating ? N  Using the Toilet? N  In the past six months, have you accidently leaked urine? N  Do you have problems with loss of bowel control? N  Managing your Medications? N  Managing your Finances? N  Housekeeping or managing your Housekeeping? N    Patient Care Team: [name], MD as PCP - General (Internal Medicine) Satira Sark, MD as PCP - Cardiology (Cardiology) Ladene Artist, MD as Consulting Physician (Gastroenterology) Anda Kraft, MD as Consulting Physician (Endocrinology) Charlton Haws, [name] as Pharmacist (Pharmacist) Luberta Mutter, MD as Consulting Physician (Ophthalmology) Alda Berthold, DO as Consulting Physician (Neurology)  Indicate any recent Medical Services you may have received from other than [facility] providers in the past year (date may be approximate).     Assessment:   This is a routine wellness examination for Deren.  Hearing/Vision screen Vision Screening - Comments:: Annual eye exams   Dietary issues and exercise activities discussed: Current Exercise Habits: Home exercise routine, Type of exercise: walking, Time (Minutes): 30, Frequency (Times/Week): 3, Weekly Exercise (Minutes/Week): 90, Intensity: Mild, Exercise limited by: respiratory conditions(s)   Goals Addressed             This Visit's Progress    Pharmacy Care Plan   On track    [place] (see longitudinal plan of care for additional care plan information)  Current Barriers:  Chronic Disease  Management support, education, and care coordination needs related to Hypertension and Hyperlipidemia   Hypertension BP Readings from Last 3 Encounters:  08/25/19 126/76  03/29/19 131/60  03/18/19 120/62  Pharmacist Clinical Goal(s): Over the next 30 days, patient will work with PharmD and providers to maintain BP goal <130/80 Current regimen:  Valsartan 40 mg - 2 tab daily Interventions: Discussed BP goals and benefits of medications for prevention of heart attack / stroke Recommend to increase valsartan to 120 mg (3 tab daily) and follow up with cardiologist in 10 days as scheduled Patient self care activities - Over the next 30 days, patient will: Check BP daily, document, and provide at future appointments Ensure daily salt intake < 2300 mg/day  Hyperlipidemia Lab Results  Component Value Date/Time   LDLCALC 71 08/25/2019 08:47 AM  Pharmacist Clinical Goal(s): Over the next 30 days, patient will work with PharmD and providers to maintain LDL goal < 70 Current regimen:  Rosuvastatin 20 mg daily Aspirin 81 mg daily Interventions: Discussed cholesterol goals and benefits of medications for prevention of heart attack / stroke Discussed interaction between grapefruit juice and rosuvastatin is not clinically relevant in his case Patient self care activities - Over the next 30 days, patient will: Continue current medications  Medication management Pharmacist Clinical  Goal(s): Over the next 30 days, patient will work with PharmD and providers to maintain optimal medication adherence Current pharmacy: [facility] Interventions Comprehensive medication review performed. Continue current medication management strategy Patient self care activities - Over the next 30 days, patient will: Focus on medication adherence by fill date Take medications as prescribed Report any questions or concerns to PharmD and/or provider(s)  Please see past updates related to this goal by clicking on the  "Past Updates" button in the selected goal        Depression Screen    09/24/2021    8:21 AM 09/05/2021    1:09 PM 08/29/2021    8:28 AM 09/18/2020    9:54 AM 08/29/2020    4:57 PM 08/25/2019    7:54 AM 07/20/2018    8:13 AM  PHQ 2/9 Scores  PHQ - 2 Score 0 0 0 0 0 0 0  PHQ- 9 Score 0 0 0  0      Fall Risk    09/24/2021    8:19 AM 09/05/2021   12:41 PM 08/29/2021    8:28 AM 01/31/2021    7:54 AM 10/25/2020    8:16 AM  [place] in the past year? 0 0 0 0 0  Number falls in past yr: 0 0 0 0 0  Injury with Fall? 0 0 0 0 0  Risk for fall due to : No Fall Risks No Fall Risks No Fall Risks    Follow up Falls prevention discussed Falls evaluation completed Falls evaluation completed      [place]:  Any stairs in or around the home? Yes  If so, are there any without handrails? No  Home free of loose throw rugs in walkways, pet beds, electrical cords, etc? Yes  Adequate lighting in your home to reduce risk of falls? Yes   ASSISTIVE DEVICES UTILIZED TO PREVENT FALLS:  Life alert? No  Use of a cane, walker or w/c? No  Grab bars in the bathroom? No  Shower chair or bench in shower? No  Elevated toilet seat or a handicapped toilet? No          09/24/2021    8:23 AM  6CIT Screen  What Year? 0 points  What month? 0 points  What time? 0 points  Count back from 20 0 points  Months in reverse 0 points  Repeat phrase 0 points  Total Score 0 points    Immunizations Immunization History  Administered Date(s) Administered   Fluad Quad(high Dose 65+) 10/12/2020   Influenza Split 10/25/2004, 10/30/2005   Influenza Whole 10/07/2007, 10/17/2008   Influenza, High Dose Seasonal PF 10/16/2012, 10/15/2013, 10/20/2017, 10/19/2018, 11/03/2019   Influenza-Unspecified 12/25/1999, 02/11/2003, 10/25/2015, 10/14/2016   Moderna Sars-Covid-2 Vaccination 01/26/2019, 02/23/2019, 09/16/2019   Pneumococcal Conjugate-13 08/19/2014   Pneumococcal  Polysaccharide-23 08/09/2010   Td 01/07/2001   Tdap 09/30/2011, 05/04/2021    TDAP status: Up to date  Flu Vaccine status: Due, Education has been provided regarding the importance of this vaccine. Advised may receive this vaccine at local pharmacy or Health Dept. Aware to provide a copy of the vaccination record if obtained from local pharmacy or Health Dept. Verbalized acceptance and understanding.  Pneumococcal vaccine status: Up to date  Covid-19 vaccine status: Completed vaccines  Qualifies for Shingles Vaccine? Yes   Zostavax completed No   Shingrix Completed?: No.    Education has been provided regarding the importance of this vaccine. Patient has  been advised to call insurance company to determine out of pocket expense if they have not yet received this vaccine. Advised may also receive vaccine at local pharmacy or Health Dept. Verbalized acceptance and understanding.  Screening Tests Health Maintenance  Topic Date Due   Zoster Vaccines- Shingrix (1 of 2) Never done   COVID-19 Vaccine (4 - Moderna risk series) 11/11/2019   COLONOSCOPY (Pts 45-79yrs Insurance coverage will need to be confirmed)  08/27/2021   INFLUENZA VACCINE  04/07/2022 (Originally 08/07/2021)   TETANUS/TDAP  05/05/2031   Pneumonia Vaccine 78+ Years old  Completed   Hepatitis C Screening  Completed   HPV VACCINES  Aged Out    Health Maintenance  Health Maintenance Due  Topic Date Due   Zoster Vaccines- Shingrix (1 of 2) Never done   COVID-19 Vaccine (4 - Moderna risk series) 11/11/2019   COLONOSCOPY (Pts 45-39yrs Insurance coverage will need to be confirmed)  08/27/2021    Colorectal cancer screening: No longer required.   Lung Cancer Screening: (Low Dose CT Chest recommended if Age 29-80 years, 30 pack-year currently smoking OR have quit w/in 15years.) does not qualify.   Lung Cancer Screening Referral: n/a  Additional Screening:  Hepatitis C Screening: does not qualify;  Vision Screening:  Recommended annual ophthalmology exams for early detection of glaucoma and other disorders of the eye. Is the patient up to date with their annual eye exam?  Yes  Who is the provider or what is the name of the office in which the patient attends annual eye exams? Dr.McQuen  If pt is not established with a provider, would they like to be referred to a provider to establish care? No .   Dental Screening: Recommended annual dental exams for proper oral hygiene  Community Resource Referral / Chronic Care Management: CRR required this visit?  No   CCM required this visit?  No      Plan:     I have personally reviewed and noted the following in the patient's chart:   Medical and social history Use of alcohol, tobacco or illicit drugs  Current medications and supplements including opioid prescriptions. Patient is not currently taking opioid prescriptions. Functional ability and status Nutritional status Physical activity Advanced directives List of other physicians Hospitalizations, surgeries, and ER visits in previous 12 months Vitals Screenings to include cognitive, depression, and falls Referrals and appointments  In addition, I have reviewed and discussed with patient certain preventive protocols, quality metrics, and best practice recommendations. A written personalized care plan for preventive services as well as general preventive health recommendations were provided to patient.     Daphane Shepherd, LPN   1/61/0960   Nurse Notes: Due flu vaccine this fall

## 2021-09-24 NOTE — Patient Instructions (Signed)
Patrick Harris , Thank you for taking time to come for your Medicare Wellness Visit. I appreciate your ongoing commitment to your health goals. Please review the following plan we discussed and let me know if I can assist you in the future.   Screening recommendations/referrals: Colonoscopy: no longer required  Recommended yearly ophthalmology/optometry visit for glaucoma screening and checkup Recommended yearly dental visit for hygiene and checkup  Vaccinations: Influenza vaccine: due in fall  Pneumococcal vaccine: completed  Tdap vaccine: 05/04/2021 Shingles vaccine: will consider    Covid-19: completed   Advanced directives: Please bring a copy of your health care power of attorney and living will to the office to be added to your chart at your convenience.   Conditions/risks identified: Aim for 30 minutes of exercise or brisk walking, 6-8 glasses of water, and 5 servings of fruits and vegetables each day.   Next appointment: Follow up in one year for your annual wellness visit.   Preventive Care 73 Years and Older, Male  Preventive care refers to lifestyle choices and visits with your health care provider that can promote health and wellness. What does preventive care include? A yearly physical exam. This is also called an annual well check. Dental exams once or twice a year. Routine eye exams. Ask your health care provider how often you should have your eyes checked. Personal lifestyle choices, including: Daily care of your teeth and gums. Regular physical activity. Eating a healthy diet. Avoiding tobacco and drug use. Limiting alcohol use. Practicing safe sex. Taking low doses of aspirin every day. Taking vitamin and mineral supplements as recommended by your health care provider. What happens during an annual well check? The services and screenings done by your health care provider during your annual well check will depend on your age, overall health, lifestyle risk  factors, and family history of disease. Counseling  Your health care provider may ask you questions about your: Alcohol use. Tobacco use. Drug use. Emotional well-being. Home and relationship well-being. Sexual activity. Eating habits. History of falls. Memory and ability to understand (cognition). Work and work Statistician. Screening  You may have the following tests or measurements: Height, weight, and BMI. Blood pressure. Lipid and cholesterol levels. These may be checked every 5 years, or more frequently if you are over 28 years old. Skin check. Lung cancer screening. You may have this screening every year starting at age 80 if you have a 30-pack-year history of smoking and currently smoke or have quit within the past 15 years. Fecal occult blood test (FOBT) of the stool. You may have this test every year starting at age 9. Flexible sigmoidoscopy or colonoscopy. You may have a sigmoidoscopy every 5 years or a colonoscopy every 10 years starting at age 41. Prostate cancer screening. Recommendations will vary depending on your family history and other risks. Hepatitis C blood test. Hepatitis B blood test. Sexually transmitted disease (STD) testing. Diabetes screening. This is done by checking your blood sugar (glucose) after you have not eaten for a while (fasting). You may have this done every 1-3 years. Abdominal aortic aneurysm (AAA) screening. You may need this if you are a current or former smoker. Osteoporosis. You may be screened starting at age 11 if you are at high risk. Talk with your health care provider about your test results, treatment options, and if necessary, the need for more tests. Vaccines  Your health care provider may recommend certain vaccines, such as: Influenza vaccine. This is recommended every year. Tetanus, diphtheria,  and acellular pertussis (Tdap, Td) vaccine. You may need a Td booster every 10 years. Zoster vaccine. You may need this after age  35. Pneumococcal 13-valent conjugate (PCV13) vaccine. One dose is recommended after age 23. Pneumococcal polysaccharide (PPSV23) vaccine. One dose is recommended after age 29. Talk to your health care provider about which screenings and vaccines you need and how often you need them. This information is not intended to replace advice given to you by your health care provider. Make sure you discuss any questions you have with your health care provider. Document Released: 01/20/2015 Document Revised: 09/13/2015 Document Reviewed: 10/25/2014 Elsevier Interactive Patient Education  2017 [employer] Prevention in the Home Falls can cause injuries. They can happen to people of all ages. There are many things you can do to make your home safe and to help prevent falls. What can I do on the outside of my home? Regularly fix the edges of walkways and driveways and fix any cracks. Remove anything that might make you trip as you walk through a door, such as a raised step or threshold. Trim any bushes or trees on the path to your home. Use bright outdoor lighting. Clear any walking paths of anything that might make someone trip, such as rocks or tools. Regularly check to see if handrails are loose or broken. Make sure that both sides of any steps have handrails. Any raised decks and porches should have guardrails on the edges. Have any leaves, snow, or ice cleared regularly. Use sand or salt on walking paths during winter. Clean up any spills in your garage right away. This includes oil or grease spills. What can I do in the bathroom? Use night lights. Install grab bars by the toilet and in the tub and shower. Do not use towel bars as grab bars. Use non-skid mats or decals in the tub or shower. If you need to sit down in the shower, use a plastic, non-slip stool. Keep the floor dry. Clean up any water that spills on the floor as soon as it happens. Remove soap buildup in the tub or shower  regularly. Attach bath mats securely with double-sided non-slip rug tape. Do not have throw rugs and other things on the floor that can make you trip. What can I do in the bedroom? Use night lights. Make sure that you have a light by your bed that is easy to reach. Do not use any sheets or blankets that are too big for your bed. They should not hang down onto the floor. Have a firm chair that has side arms. You can use this for support while you get dressed. Do not have throw rugs and other things on the floor that can make you trip. What can I do in the kitchen? Clean up any spills right away. Avoid walking on wet floors. Keep items that you use a lot in easy-to-reach places. If you need to reach something above you, use a strong step stool that has a grab bar. Keep electrical cords out of the way. Do not use floor polish or wax that makes floors slippery. If you must use wax, use non-skid floor wax. Do not have throw rugs and other things on the floor that can make you trip. What can I do with my stairs? Do not leave any items on the stairs. Make sure that there are handrails on both sides of the stairs and use them. Fix handrails that are broken or loose. Make sure  that handrails are as long as the stairways. Check any carpeting to make sure that it is firmly attached to the stairs. Fix any carpet that is loose or worn. Avoid having throw rugs at the top or bottom of the stairs. If you do have throw rugs, attach them to the floor with carpet tape. Make sure that you have a light switch at the top of the stairs and the bottom of the stairs. If you do not have them, ask someone to add them for you. What else can I do to help prevent falls? Wear shoes that: Do not have high heels. Have rubber bottoms. Are comfortable and fit you well. Are closed at the toe. Do not wear sandals. If you use a stepladder: Make sure that it is fully opened. Do not climb a closed stepladder. Make sure that  both sides of the stepladder are locked into place. Ask someone to hold it for you, if possible. Clearly mark and make sure that you can see: Any grab bars or handrails. First and last steps. Where the edge of each step is. Use tools that help you move around (mobility aids) if they are needed. These include: Canes. Walkers. Scooters. Crutches. Turn on the lights when you go into a dark area. Replace any light bulbs as soon as they burn out. Set up your furniture so you have a clear path. Avoid moving your furniture around. If any of your floors are uneven, fix them. If there are any pets around you, be aware of where they are. Review your medicines with your doctor. Some medicines can make you feel dizzy. This can increase your chance of falling. Ask your doctor what other things that you can do to help prevent falls. This information is not intended to replace advice given to you by your health care provider. Make sure you discuss any questions you have with your health care provider. Document Released: 10/20/2008 Document Revised: 06/01/2015 Document Reviewed: 01/28/2014 Elsevier Interactive Patient Education  2017 [employer].

## 2021-09-25 ENCOUNTER — Encounter (HOSPITAL_COMMUNITY)
Admission: RE | Admit: 2021-09-25 | Discharge: 2021-09-25 | Disposition: A | Discharge status: Home / Self Care | Payer: Medicare Other | Source: Ambulatory Visit | Attending: Oncology | Admitting: Oncology

## 2021-09-25 DIAGNOSIS — C3412 Malignant neoplasm of upper lobe, left bronchus or lung: Secondary | ICD-10-CM | POA: Diagnosis not present

## 2021-09-25 NOTE — Progress Notes (Signed)
Daily Session Note  Patient Details  Name: Patrick Harris MRN: [id number] Date of Birth: 1945-05-31 Referring Provider:   [place] from 09/05/2021 in [place]  Referring Provider Dr. Loletta Specter       Encounter Date: 09/25/2021  Check In:  Session Check In - 09/25/21 1324       Check-In   Supervising physician immediately available to respond to emergencies [facility] MD immediately available    Physician(s) Dr Harl Bowie    Location [facility]    Staff Present Geanie Cooley, RN;Jo Cerone Hassell Done, RN, Bjorn Loser, MS, ACSM-CEP, Exercise Physiologist    Virtual Visit No    Medication changes reported     No    Fall or balance concerns reported    Yes    Tobacco Cessation No Change    Warm-up and Cool-down Performed as group-led instruction    Resistance Training Performed Yes    VAD Patient? No    PAD/SET Patient? No      Pain Assessment   Currently in Pain? No/denies    Multiple Pain Sites No             Capillary Blood Glucose: No results found for this or any previous visit (from the past 24 hour(s)).    Social History   Tobacco Use  Smoking Status Some Days   Types: Cigarettes   Last attempt to quit: 01/07/1998   Years since quitting: 23.7  Smokeless Tobacco Never  Tobacco Comments   Smokes "every once in a while"   07/25/21 ARJ     Goals Met:  Proper associated with RPD/PD & O2 Sat Independence with exercise equipment Using PLB without cueing & demonstrates good technique Exercise tolerated well Queuing for purse lip breathing No report of concerns or symptoms today Strength training completed today  Goals Unmet:  Not Applicable  Comments: Check out at 1430.   Dr. Kathie Dike is Medical Director for [facility].

## 2021-09-27 ENCOUNTER — Encounter (HOSPITAL_COMMUNITY)
Admission: RE | Admit: 2021-09-27 | Discharge: 2021-09-27 | Disposition: A | Discharge status: Home / Self Care | Payer: Medicare Other | Source: Ambulatory Visit | Attending: Oncology | Admitting: Oncology

## 2021-09-27 DIAGNOSIS — C3412 Malignant neoplasm of upper lobe, left bronchus or lung: Secondary | ICD-10-CM | POA: Diagnosis not present

## 2021-09-27 NOTE — Progress Notes (Signed)
Daily Session Note  Patient Details  Name: Patrick Harris MRN: [id number] Date of Birth: Apr 12, 1945 Referring Provider:   [place] from 09/05/2021 in [place]  Referring Provider Dr. Loletta Specter       Encounter Date: 09/27/2021  Check In:  Session Check In - 09/27/21 1330       Check-In   Supervising physician immediately available to respond to emergencies [facility] immediately available    Physician(s) Dr. Harrington Challenger    Location [facility]    Staff Present Geanie Cooley, RN;Daphyne Hassell Done, RN, BSN;Dalton Kris Mouton, MS, ACSM-CEP, Exercise Physiologist;Debra Wynetta Emery, RN, BSN    Virtual Visit No    Medication changes reported     No    Fall or balance concerns reported    Yes    Tobacco Cessation No Change    Warm-up and Cool-down Performed as group-led instruction    Resistance Training Performed Yes    VAD Patient? No    PAD/SET Patient? No      Pain Assessment   Currently in Pain? No/denies    Multiple Pain Sites No             Capillary Blood Glucose: No results found for this or any previous visit (from the past 24 hour(s)).    Social History   Tobacco Use  Smoking Status Some Days   Types: Cigarettes   Last attempt to quit: 01/07/1998   Years since quitting: 23.7  Smokeless Tobacco Never  Tobacco Comments   Smokes "every once in a while"   07/25/21 ARJ     Goals Met:  Proper associated with RPD/PD & O2 Sat Independence with exercise equipment Exercise tolerated well No report of concerns or symptoms today Strength training completed today  Goals Unmet:  Not Applicable  Comments: check out @ 2:30pm   Dr. Kathie Dike is Medical Director for [facility].

## 2021-10-01 ENCOUNTER — Ambulatory Visit: Payer: Medicare Other | Admitting: Neurology

## 2021-10-01 ENCOUNTER — Encounter: Payer: Self-pay | Admitting: Neurology

## 2021-10-01 VITALS — BP 160/83 | HR 79 | Ht 72.0 in | Wt 186.0 lb

## 2021-10-01 DIAGNOSIS — C3412 Malignant neoplasm of upper lobe, left bronchus or lung: Secondary | ICD-10-CM | POA: Diagnosis not present

## 2021-10-01 DIAGNOSIS — Z1159 Encounter for screening for other viral diseases: Secondary | ICD-10-CM | POA: Diagnosis not present

## 2021-10-01 DIAGNOSIS — R202 Paresthesia of skin: Secondary | ICD-10-CM

## 2021-10-01 DIAGNOSIS — C771 Secondary and unspecified malignant neoplasm of intrathoracic lymph nodes: Secondary | ICD-10-CM | POA: Diagnosis not present

## 2021-10-01 DIAGNOSIS — C3411 Malignant neoplasm of upper lobe, right bronchus or lung: Secondary | ICD-10-CM | POA: Diagnosis not present

## 2021-10-01 NOTE — Patient Instructions (Signed)
You can try lidocaine ointment to your leg.  If your symptoms get worse, come back and see me.

## 2021-10-01 NOTE — Progress Notes (Signed)
Follow-up Visit   Date: 10/01/21   Patrick Harris MRN: [id number] DOB: Sep 29, 1945   Interim History: Patrick Harris is a 76 y.o. right-handed Caucasian male with hypertension, hyperlipidemia, GERD, and pituitary adenoma returning to the clinic for follow-up of left leg pain.  The patient was accompanied to the clinic by self.  History of present illness: Starting around 3-4 years, he began having cramp involving the medial left leg, which resolved with gabapentin.  He also has burning sensation over the medial knee, medial lower leg, and into the great toe.  This pain is constant.  There is not thing that provides any relief or exacerbates his pain.  He does not have similar symptoms in the right foot. He has seen Vascular surgery whose evaluated showed mild PAD, but did not feel that his symptoms were related to this.  He has also seen Dr. Ellene Route at [facility] who performed MRI cervical spine and lumbar spine. I will request these reports.  His MRI lumbar spine did not seems to show any nerve impingement to explain his symptoms.   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  His daughter passed away in 04/10/2018 from breast cancer.   UPDATE 01/30/2021:   He was started on gabapentin 300mg  BID for his left leg pain at his last visit. He reports no change. He continues to have burning and tingling involving her medial lower leg and into her foot. No weakness.  He also complains of left hand in tingling and numbness over the dorsum of the thumb and hand.  No weakness.  Symptoms started several months ago when he noticed growth over the left forearm. He has since had the skin lesion resected which was diagnosed as squamous cell cancer.  He also reports having episodic muscle twitches when he is very drowsy and sleeping.   UPDATE 10/01/2021:  He has been out of gabapentin for the past 2 weeks and did not appreciated that it helped left leg paresthesias when he was taking it, nor once he  has been off it.  Left forearm tingling has resolved.   He was diagnosed with non-small cell lung cancer in May after requesting screening CT scan for surveillance.  He is going to [facility] oncology for lung cancer and has started chemotherapy. He does not report any new numbness/tingling since starting chemotherapy.    Medications:  Current Outpatient Medications on File Prior to Visit  Medication Sig Dispense Refill   acetaminophen (TYLENOL) 650 MG CR tablet Take 1,300 mg by mouth every 8 (eight) hours as needed for pain.     aspirin EC 81 MG tablet Take 1 tablet (81 mg total) by mouth at bedtime. Okay to restart this medication on 07/03/2021 30 tablet 11   Cholecalciferol (VITAMIN D3) 2000 UNITS TABS Take 2,000 Units by mouth in the morning and at bedtime.     Coenzyme Q10 (COQ-10) 100 MG CAPS Take 100 mg by mouth in the morning.     Cyanocobalamin (B-12 PO) Take 1 tablet by mouth daily.     gabapentin (NEURONTIN) 300 MG capsule Take 1 tablet at bedtime and 2 tablets at bedtime. 90 capsule 5   Garlic 5462 MG CAPS Take 1,000 mg by mouth in the morning.     hydrALAZINE (APRESOLINE) 25 MG tablet Take 1 tablet (25 mg total) by mouth 3 (three) times daily. 270 tablet 3   irbesartan (AVAPRO) 300 MG tablet Take 1 tablet (300 mg total) by mouth daily. [place]  tablet 3   Menaquinone-7 (VITAMIN K2) 100 MCG CAPS Take 100 mcg by mouth in the morning.     nitroGLYCERIN (NITROSTAT) 0.4 MG SL tablet Place 1 tablet (0.4 mg total) under the tongue every 5 (five) minutes x 3 doses as needed for chest pain (if no relief after 2nd dose, proceed to ED or call 911). 25 tablet 3   Omega-3 Fatty Acids (OMEGA-3 FISH OIL PO) Take 1,000 mg by mouth in the morning.     omeprazole (PRILOSEC) 40 MG capsule Take 1 capsule (40 mg total) by mouth in the morning and at bedtime. (Patient taking differently: Take 40 mg by mouth daily.) 60 capsule 11   ondansetron (ZOFRAN) 8 MG tablet Take 8 mg by mouth every 8 (eight) hours as needed.      Polyethyl Glycol-Propyl Glycol (LUBRICANT EYE DROPS) 0.4-0.3 % SOLN Place 1-2 drops into both eyes 3 (three) times daily as needed (dry/irritated eyes.).     pyrithione zinc (SELSUN BLUE DRY SCALP) 1 % shampoo Apply 1 application  topically daily.     rosuvastatin (CRESTOR) 20 MG tablet Take 0.5 tablets (10 mg total) by mouth every evening. TAKE HALF A TABLET (10 MG) BY MOUTH ONCE DAILY.     tamsulosin (FLOMAX) 0.4 MG CAPS capsule Take 0.4 mg by mouth in the morning.     Testosterone 20.25 MG/ACT (1.62%) GEL Apply 2 Pump topically in the morning.     TURMERIC PO Take 1,000 mg by mouth in the morning.     vitamin C (ASCORBIC ACID) 500 MG tablet Take 500 mg by mouth in the morning.     No current facility-administered medications on file prior to visit.    Allergies:  Allergies  Allergen Reactions   Latex Dermatitis    Contact  dermatitis   Bromocriptine Nausea Only    Dry heaves   Zestril [Lisinopril] Other (See Comments)    Leg pain, inc HR, dizziness, headache   Diovan [Valsartan] Nausea Only   Hydrochlorothiazide Other (See Comments)    Possibly caused pancreatitis 2018   Norvasc [Amlodipine] Other (See Comments)    Edema    Zocor [Simvastatin]     Joint pain    Vital Signs:  BP (!) 160/83   Pulse 79   Ht 6' (1.829 m)   Wt 186 lb (84.4 kg)   SpO2 96%   BMI 25.23 kg/m    Neurological Exam: MENTAL STATUS including orientation to time, place, person, recent and remote memory, attention span and concentration, language, and fund of knowledge is normal.  Speech is not dysarthric.  CRANIAL NERVES:  No visual field defects.  Pupils equal round and reactive to light.  Normal conjugate, extra-ocular eye movements in all directions of gaze.  No ptosis.  Face is symmetric. Palate elevates symmetrically.  Tongue is midline.  MOTOR:  Motor strength is 5/5 in all extremities.  No atrophy, fasciculations or abnormal movements.  No pronator drift.  Tone is normal.    MSRs:  Reflexes  are 2+/4 throughout.  SENSORY:  Intact to vibration throughout.  Temperature and pin prick reduced over the left radial sensory distribution.    COORDINATION/GAIT:  Normal finger-to- nose-finger.  Intact rapid alternating movements bilaterally.  Gait narrow based and stable.   Data: n/a  IMPRESSION/PLAN: Left medial knee, lower leg and foot paresthesias, chronic. No evidence of nerve impingement affecting the L4 nerve. NCS/EMG of the left leg declined  - No benefit with gabapentin  - Alternative medications discussed,  he prefers not to take anything more at this time  - Recommend trying OTC lidocaine ointment   2.  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  Return to clinic as needed  Thank you for allowing me to participate in patient's care.  If I can answer any additional questions, I would be pleased to do so.    Sincerely,    Daijanae Rafalski K. Posey Pronto, DO

## 2021-10-02 ENCOUNTER — Encounter (HOSPITAL_COMMUNITY)
Admission: RE | Admit: 2021-10-02 | Discharge: 2021-10-02 | Disposition: A | Discharge status: Home / Self Care | Payer: Medicare Other | Source: Ambulatory Visit | Attending: Oncology | Admitting: Oncology

## 2021-10-02 DIAGNOSIS — C3412 Malignant neoplasm of upper lobe, left bronchus or lung: Secondary | ICD-10-CM | POA: Diagnosis not present

## 2021-10-02 NOTE — Progress Notes (Signed)
Daily Session Note  Patient Details  Name: Patrick Harris MRN: [id number] Date of Birth: May 29, 1945 Referring Provider:   [place] from 09/05/2021 in [place]  Referring Provider Dr. Loletta Specter       Encounter Date: 10/02/2021  Check In:  Session Check In - 10/02/21 1328       Check-In   Supervising physician immediately available to respond to emergencies [facility] MD immediately available    Physician(s) Dr Domenic Polite    Location [facility]    Staff Present Geanie Cooley, RN;Mckynlie Vanderslice Hassell Done, RN, BSN;Heather Otho Ket, BS, Exercise Physiologist    Virtual Visit No    Medication changes reported     No    Fall or balance concerns reported    Yes    Tobacco Cessation No Change    Warm-up and Cool-down Performed as group-led instruction    Resistance Training Performed Yes    VAD Patient? No    PAD/SET Patient? No      Pain Assessment   Currently in Pain? No/denies    Multiple Pain Sites No             Capillary Blood Glucose: No results found for this or any previous visit (from the past 24 hour(s)).    Social History   Tobacco Use  Smoking Status Some Days   Types: Cigarettes   Last attempt to quit: 01/07/1998   Years since quitting: 23.7  Smokeless Tobacco Never  Tobacco Comments   Smokes "every once in a while"   07/25/21 ARJ     Goals Met:  Independence with exercise equipment Exercise tolerated well No report of concerns or symptoms today Strength training completed today  Goals Unmet:  Not Applicable  Comments: Checkout at 1430.   Dr. Kathie Dike is Medical Director for [facility] Rehab.

## 2021-10-03 DIAGNOSIS — Z5111 Encounter for antineoplastic chemotherapy: Secondary | ICD-10-CM | POA: Diagnosis not present

## 2021-10-03 DIAGNOSIS — Z1159 Encounter for screening for other viral diseases: Secondary | ICD-10-CM | POA: Diagnosis not present

## 2021-10-03 DIAGNOSIS — Z5112 Encounter for antineoplastic immunotherapy: Secondary | ICD-10-CM | POA: Diagnosis not present

## 2021-10-03 DIAGNOSIS — C3411 Malignant neoplasm of upper lobe, right bronchus or lung: Secondary | ICD-10-CM | POA: Diagnosis not present

## 2021-10-04 ENCOUNTER — Other Ambulatory Visit: Payer: Self-pay | Admitting: Internal Medicine

## 2021-10-04 ENCOUNTER — Encounter (HOSPITAL_COMMUNITY)
Admission: RE | Admit: 2021-10-04 | Discharge: 2021-10-04 | Disposition: A | Discharge status: Home / Self Care | Payer: Medicare Other | Source: Ambulatory Visit | Attending: Oncology | Admitting: Oncology

## 2021-10-04 DIAGNOSIS — C3412 Malignant neoplasm of upper lobe, left bronchus or lung: Secondary | ICD-10-CM | POA: Diagnosis not present

## 2021-10-04 NOTE — Progress Notes (Signed)
Daily Session Note  Patient Details  Name: Patrick Harris MRN: [id number] Date of Birth: 1945/11/25 Referring Provider:   Flowsheet Row PULMONARY REHAB OTHER RESP ORIENTATION from 09/05/2021 in [place]  Referring Provider Dr. Loletta Specter       Encounter Date: 10/04/2021  Check In:  Session Check In - 10/04/21 1329       Check-In   Supervising physician immediately available to respond to emergencies [facility] MD immediately available    Physician(s) Dr. Harl Bowie    Location [facility]    Staff Present Hoy Register, MS, ACSM-CEP, Exercise Physiologist;Heather Zigmund Daniel, Exercise Physiologist    Virtual Visit No    Medication changes reported     No    Fall or balance concerns reported    Yes    Tobacco Cessation No Change    Warm-up and Cool-down Performed as group-led instruction    Resistance Training Performed Yes    VAD Patient? No    PAD/SET Patient? No      Pain Assessment   Currently in Pain? No/denies    Multiple Pain Sites No             Capillary Blood Glucose: No results found for this or any previous visit (from the past 24 hour(s)).    Social History   Tobacco Use  Smoking Status Some Days   Types: Cigarettes   Last attempt to quit: 01/07/1998   Years since quitting: 23.7  Smokeless Tobacco Never  Tobacco Comments   Smokes "every once in a while"   07/25/21 ARJ     Goals Met:  Proper associated with RPD/PD & O2 Sat Using PLB without cueing & demonstrates good technique Exercise tolerated well Queuing for purse lip breathing No report of concerns or symptoms today Strength training completed today  Goals Unmet:  Not Applicable  Comments: checkout time is 1430   Dr. Kathie Dike is Medical Director for [facility].

## 2021-10-08 DIAGNOSIS — C3491 Malignant neoplasm of unspecified part of right bronchus or lung: Secondary | ICD-10-CM | POA: Diagnosis not present

## 2021-10-09 ENCOUNTER — Encounter (HOSPITAL_COMMUNITY)
Admission: RE | Admit: 2021-10-09 | Discharge: 2021-10-09 | Disposition: A | Discharge status: Home / Self Care | Payer: Medicare Other | Source: Ambulatory Visit | Attending: Oncology | Admitting: Oncology

## 2021-10-09 DIAGNOSIS — C3491 Malignant neoplasm of unspecified part of right bronchus or lung: Secondary | ICD-10-CM | POA: Diagnosis not present

## 2021-10-09 DIAGNOSIS — C3412 Malignant neoplasm of upper lobe, left bronchus or lung: Secondary | ICD-10-CM | POA: Diagnosis not present

## 2021-10-09 NOTE — Progress Notes (Addendum)
Daily Session Note  Patient Details  Name: Patrick Harris MRN: [id number] Date of Birth: [birth year] Referring Provider:   [place] from 09/05/2021 in [place]  Referring Provider Dr. Loletta Specter       Encounter Date: 10/09/2021  Check In:  Session Check In - 10/09/21 1326       Check-In   Supervising physician immediately available to respond to emergencies [facility] MD immediately available    Physician(s) Dr. Harl Bowie    Location [facility]    Staff Present Geanie Cooley, RN;Athenia Rys Hassell Done, RN, BSN;Heather Otho Ket, BS, Exercise Physiologist    Virtual Visit No    Medication changes reported     No    Fall or balance concerns reported    Yes    Tobacco Cessation No Change    Warm-up and Cool-down Performed as group-led instruction    Resistance Training Performed Yes    VAD Patient? No    PAD/SET Patient? No      Pain Assessment   Currently in Pain? No/denies    Multiple Pain Sites No             Capillary Blood Glucose: No results found for this or any previous visit (from the past 24 hour(s)).    Social History   Tobacco Use  Smoking Status Some Days   Types: Cigarettes   Last attempt to quit: 01/07/1998   Years since quitting: 23.7  Smokeless Tobacco Never  Tobacco Comments   Smokes "every once in a while"   07/25/21 ARJ     Goals Met:  Proper associated with RPD/PD & O2 Sat Independence with exercise equipment Using PLB without cueing & demonstrates good technique Exercise tolerated well Queuing for purse lip breathing No report of concerns or symptoms today Strength training completed today  Goals Unmet:  Not Applicable  Comments: Checkout at 1430.   Dr. Kathie Dike is Medical Director for [facility].

## 2021-10-11 ENCOUNTER — Encounter (HOSPITAL_COMMUNITY)
Admission: RE | Admit: 2021-10-11 | Discharge: 2021-10-11 | Disposition: A | Discharge status: Home / Self Care | Payer: Medicare Other | Source: Ambulatory Visit | Attending: Oncology | Admitting: Oncology

## 2021-10-11 DIAGNOSIS — C3412 Malignant neoplasm of upper lobe, left bronchus or lung: Secondary | ICD-10-CM

## 2021-10-11 NOTE — Progress Notes (Signed)
Daily Session Note  Patient Details  Name: Patrick Harris MRN: [id number] Date of Birth: [birth year] Referring Provider:   [place] from 09/05/2021 in [facility]  Referring Provider Dr. Loletta Specter       Encounter Date: 10/11/2021  Check In:  Session Check In - 10/11/21 1324       Check-In   Supervising physician immediately available to respond to emergencies [facility] MD immediately available    Physician(s) Dr. Harrington Challenger    Location [facility]    Staff Present Hoy Register, MS, ACSM-CEP, Exercise Physiologist;Heather Zigmund Daniel, Exercise Physiologist    Virtual Visit No    Medication changes reported     No    Fall or balance concerns reported    Yes    Tobacco Cessation No Change    Warm-up and Cool-down Performed as group-led instruction    Resistance Training Performed Yes    VAD Patient? No    PAD/SET Patient? No      Pain Assessment   Currently in Pain? No/denies    Multiple Pain Sites No             Capillary Blood Glucose: No results found for this or any previous visit (from the past 24 hour(s)).    Social History   Tobacco Use  Smoking Status Some Days   Types: Cigarettes   Last attempt to quit: 01/07/1998   Years since quitting: 23.7  Smokeless Tobacco Never  Tobacco Comments   Smokes "every once in a while"   07/25/21 ARJ     Goals Met:  Proper associated with RPD/PD & O2 Sat Independence with exercise equipment Using PLB without cueing & demonstrates good technique Exercise tolerated well Queuing for purse lip breathing No report of concerns or symptoms today Strength training completed today  Goals Unmet:  Not Applicable  Comments: checkout time is 1430   Dr. Kathie Dike is Medical Director for [facility].

## 2021-10-16 ENCOUNTER — Encounter (HOSPITAL_COMMUNITY): Payer: Medicare Other

## 2021-10-16 DIAGNOSIS — C3411 Malignant neoplasm of upper lobe, right bronchus or lung: Secondary | ICD-10-CM | POA: Diagnosis not present

## 2021-10-16 DIAGNOSIS — L270 Generalized skin eruption due to drugs and medicaments taken internally: Secondary | ICD-10-CM | POA: Diagnosis not present

## 2021-10-17 NOTE — Progress Notes (Signed)
Pulmonary Individual Treatment Plan  Patient Details  Name: Patrick Harris MRN: [id number] Date of Birth: Mar 03, 1945 Referring Provider:   [place] from 09/05/2021 in [facility]  Referring Provider Dr. Loletta Specter       Initial Encounter Date:  Flowsheet Row PULMONARY REHAB OTHER RESP ORIENTATION from 09/05/2021 in [facility]  Date 09/05/21       Visit Diagnosis: Malignant neoplasm of upper lobe of left lung ([place])  Patient's Home Medications on Admission:   Current Outpatient Medications:    acetaminophen (TYLENOL) 650 MG CR tablet, Take 1,300 mg by mouth every 8 (eight) hours as needed for pain., Disp: , Rfl:    aspirin EC 81 MG tablet, Take 1 tablet (81 mg total) by mouth at bedtime. Okay to restart this medication on 07/03/2021, Disp: 30 tablet, Rfl: 11   Cholecalciferol (VITAMIN D3) 2000 UNITS TABS, Take 2,000 Units by mouth in the morning and at bedtime., Disp: , Rfl:    Coenzyme Q10 (COQ-10) 100 MG CAPS, Take 100 mg by mouth in the morning., Disp: , Rfl:    Cyanocobalamin (B-12 PO), Take 1 tablet by mouth daily., Disp: , Rfl:    Garlic 4098 MG CAPS, Take 1,000 mg by mouth in the morning., Disp: , Rfl:    hydrALAZINE (APRESOLINE) 25 MG tablet, Take 1 tablet (25 mg total) by mouth 3 (three) times daily., Disp: 270 tablet, Rfl: 3   irbesartan (AVAPRO) 300 MG tablet, Take 1 tablet (300 mg total) by mouth daily., Disp: 90 tablet, Rfl: 3   Menaquinone-7 (VITAMIN K2) 100 MCG CAPS, Take 100 mcg by mouth in the morning., Disp: , Rfl:    nitroGLYCERIN (NITROSTAT) 0.4 MG SL tablet, Place 1 tablet (0.4 mg total) under the tongue every 5 (five) minutes x 3 doses as needed for chest pain (if no relief after 2nd dose, proceed to ED or call 911)., Disp: 25 tablet, Rfl: 3   Omega-3 Fatty Acids (OMEGA-3 FISH OIL PO), Take 1,000 mg by mouth in the morning., Disp: , Rfl:    omeprazole (PRILOSEC) 40 MG capsule,  Take 1 capsule (40 mg total) by mouth in the morning and at bedtime. (Patient taking differently: Take 40 mg by mouth daily.), Disp: 60 capsule, Rfl: 11   ondansetron (ZOFRAN) 8 MG tablet, Take 8 mg by mouth every 8 (eight) hours as needed., Disp: , Rfl:    Polyethyl Glycol-Propyl Glycol (LUBRICANT EYE DROPS) 0.4-0.3 % SOLN, Place 1-2 drops into both eyes 3 (three) times daily as needed (dry/irritated eyes.)., Disp: , Rfl:    pyrithione zinc (SELSUN BLUE DRY SCALP) 1 % shampoo, Apply 1 application  topically daily., Disp: , Rfl:    rosuvastatin (CRESTOR) 20 MG tablet, TAKE HALF A TABLET (10 MG) BY MOUTH ONCE DAILY., Disp: 45 tablet, Rfl: 3   tamsulosin (FLOMAX) 0.4 MG CAPS capsule, Take 0.4 mg by mouth in the morning., Disp: , Rfl:    Testosterone 20.25 MG/ACT (1.62%) GEL, Apply 2 Pump topically in the morning., Disp: , Rfl:    TURMERIC PO, Take 1,000 mg by mouth in the morning., Disp: , Rfl:    vitamin C (ASCORBIC ACID) 500 MG tablet, Take 500 mg by mouth in the morning., Disp: , Rfl:   Past Medical History: Past Medical History:  Diagnosis Date   Arthritis    CAD (coronary artery disease)    CABG with LIMA to LAD and RIMA to RCA August 2000 - Dr.  Hendrickson   Cancer [facility])    basal cell   CHF (congestive heart failure) (HCC)    COPD (chronic obstructive pulmonary disease) (HCC)    Elevated glycated hemoglobin    Essential hypertension    GERD (gastroesophageal reflux disease)    Heart murmur    Hyperlipidemia    Hypopituitarism ([place])    Lung cancer ([place])    Osteopenia    Dr Wilson Singer   Pancreatitis    Peyronie disease    Testosterone deficiency    Dr Wilson Singer    Tobacco Use: Social History   Tobacco Use  Smoking Status Some Days   Types: Cigarettes   Last attempt to quit: 01/07/1998   Years since quitting: 23.7  Smokeless Tobacco Never  Tobacco Comments   Smokes "every once in a while"   07/25/21 [facility]     Labs: Review Flowsheet  More data exists      Latest Ref Rng & Units  08/22/2017 12/23/2017 08/24/2018 08/25/2019 08/29/2020  Labs for ITP Cardiac and Pulmonary Rehab  Cholestrol 0 - 200 mg/dL 128  137  146  122  118   LDL (calc) 0 - 99 mg/dL 71  81  86  71  65   HDL-C >39.00 mg/dL 29.10  36  34.20  33  33.60   Trlycerides 0.0 - 149.0 mg/dL 140.0  101  130.0  98  96.0   Hemoglobin A1c 4.6 - 6.5 % 6.2  5.9  6.1  5.5  6.0     Capillary Blood Glucose: Lab Results  Component Value Date   GLUCAP 117 (H) 07/23/2021     Pulmonary Assessment Scores:  Pulmonary Assessment Scores     Row Name 09/05/21 1249         ADL UCSD   ADL Phase Entry     SOB Score total 16     Rest 0     Walk 2     Stairs 3     Bath 0     Dress 0     Shop 0       CAT Score   CAT Score 16       mMRC Score   mMRC Score 0             UCSD: Self-administered rating of dyspnea associated with activities of daily living (ADLs) 6-point scale (0 = "not at all" to 5 = "maximal or unable to do because of breathlessness")  Scoring Scores range from 0 to 120.  Minimally important difference is 5 units  CAT: CAT can identify the health impairment of COPD patients and is better correlated with disease progression.  CAT has a scoring range of zero to 40. The CAT score is classified into four groups of low (less than 10), medium (10 - 20), high (21-30) and very high (31-40) based on the impact level of disease on health status. A CAT score over 10 suggests significant symptoms.  A worsening CAT score could be explained by an exacerbation, poor medication adherence, poor inhaler technique, or progression of COPD or comorbid conditions.  CAT MCID is 2 points  mMRC: mMRC (Modified Medical Research Council) Dyspnea Scale is used to assess the degree of baseline functional disability in patients of respiratory disease due to dyspnea. No minimal important difference is established. A decrease in score of 1 point or greater is considered a positive change.   Pulmonary Function  Assessment:   Exercise Target Goals: Exercise Program Goal: Individual exercise prescription  set using results from initial 6 min walk test and THRR while considering  patient's activity barriers and safety.   Exercise Prescription Goal: Initial exercise prescription builds to 30-45 minutes a day of aerobic activity, 2-3 days per week.  Home exercise guidelines will be given to patient during program as part of exercise prescription that the participant will acknowledge.  Activity Barriers & Risk Stratification:  Activity Barriers & Cardiac Risk Stratification - 09/05/21 1310       Activity Barriers & Cardiac Risk Stratification   Activity Barriers Neck/Spine Problems;Joint Problems    Cardiac Risk Stratification High             6 Minute Walk:  6 Minute Walk     Row Name 09/05/21 1311         6 Minute Walk   Phase Initial     Distance 1400 feet     Walk Time 6 minutes     # of Rest Breaks 0     MPH 2.65     METS 2.92     RPE 11     Perceived Dyspnea  11     VO2 Peak 10.24     Symptoms No     Resting HR 68 bpm     Resting BP 132/62     Resting Oxygen Saturation  95 %     Exercise Oxygen Saturation  during 6 min walk 94 %     Max Ex. HR 75 bpm     Max Ex. BP 160/60     2 Minute Post BP 130/60       Interval HR   1 Minute HR 74     2 Minute HR 75     3 Minute HR 74     4 Minute HR 74     5 Minute HR 74     6 Minute HR 75     2 Minute Post HR 65     Interval Heart Rate? Yes       Interval Oxygen   Interval Oxygen? Yes     Baseline Oxygen Saturation % 95 %     1 Minute Oxygen Saturation % 95 %     1 Minute Liters of Oxygen 0 L     2 Minute Oxygen Saturation % 95 %     2 Minute Liters of Oxygen 0 L     3 Minute Oxygen Saturation % 94 %     3 Minute Liters of Oxygen 0 L     4 Minute Oxygen Saturation % 95 %     4 Minute Liters of Oxygen 0 L     5 Minute Oxygen Saturation % 95 %     5 Minute Liters of Oxygen 0 L     6 Minute Oxygen Saturation % 95 %      6 Minute Liters of Oxygen 0 L     2 Minute Post Oxygen Saturation % 95 %     2 Minute Post Liters of Oxygen 0 L              Oxygen Initial Assessment:  Oxygen Initial Assessment - 09/05/21 1308       Home Oxygen   Home Oxygen Device None    Sleep Oxygen Prescription None    Home Exercise Oxygen Prescription None    Home Resting Oxygen Prescription None    Compliance with Home Oxygen Use Yes      Initial 6  min Walk   Oxygen Used None      Program Oxygen Prescription   Program Oxygen Prescription None      Intervention   Short Term Goals To learn and understand importance of maintaining oxygen saturations>88%;To learn and demonstrate proper use of respiratory medications;To learn and understand importance of monitoring SPO2 with pulse oximeter and demonstrate accurate use of the pulse oximeter.    Long  Term Goals Verbalizes importance of monitoring SPO2 with pulse oximeter and return demonstration;Maintenance of O2 saturations>88%;Exhibits proper breathing techniques, such as pursed lip breathing or other method taught during program session;Compliance with respiratory medication             Oxygen Re-Evaluation:  Oxygen Re-Evaluation     Row Name 09/18/21 1428 10/16/21 1359           Program Oxygen Prescription   Program Oxygen Prescription None None        Home Oxygen   Home Oxygen Device None None      Sleep Oxygen Prescription None None      Home Exercise Oxygen Prescription None None      Home Resting Oxygen Prescription None None      Compliance with Home Oxygen Use Yes Yes        Goals/Expected Outcomes   Short Term Goals To learn and understand importance of maintaining oxygen saturations>88%;To learn and demonstrate proper use of respiratory medications;To learn and understand importance of monitoring SPO2 with pulse oximeter and demonstrate accurate use of the pulse oximeter. To learn and understand importance of maintaining oxygen  saturations>88%;To learn and demonstrate proper use of respiratory medications;To learn and understand importance of monitoring SPO2 with pulse oximeter and demonstrate accurate use of the pulse oximeter.      Long  Term Goals -- Verbalizes importance of monitoring SPO2 with pulse oximeter and return demonstration;Maintenance of O2 saturations>88%;Exhibits proper breathing techniques, such as pursed lip breathing or other method taught during program session;Compliance with respiratory medication      Goals/Expected Outcomes compliance compliance               Oxygen Discharge (Final Oxygen Re-Evaluation):  Oxygen Re-Evaluation - 10/16/21 1359       Program Oxygen Prescription   Program Oxygen Prescription None      Home Oxygen   Home Oxygen Device None    Sleep Oxygen Prescription None    Home Exercise Oxygen Prescription None    Home Resting Oxygen Prescription None    Compliance with Home Oxygen Use Yes      Goals/Expected Outcomes   Short Term Goals To learn and understand importance of maintaining oxygen saturations>88%;To learn and demonstrate proper use of respiratory medications;To learn and understand importance of monitoring SPO2 with pulse oximeter and demonstrate accurate use of the pulse oximeter.    Long  Term Goals Verbalizes importance of monitoring SPO2 with pulse oximeter and return demonstration;Maintenance of O2 saturations>88%;Exhibits proper breathing techniques, such as pursed lip breathing or other method taught during program session;Compliance with respiratory medication    Goals/Expected Outcomes compliance             Initial Exercise Prescription:  Initial Exercise Prescription - 09/05/21 1300       Date of Initial Exercise RX and Referring Provider   Date 09/05/21    Referring Provider Dr. Loletta Specter    Expected Discharge Date 11/06/21      Treadmill   MPH 1.2    Grade 0    Minutes 17  Recumbant Elliptical   Level 1    RPM 60     Minutes 22      Prescription Details   Frequency (times per week) 2    Duration Progress to 30 minutes of continuous aerobic without signs/symptoms of physical distress      Intensity   THRR 40-80% of Max Heartrate 58-115    Ratings of Perceived Exertion 11-13    Perceived Dyspnea 0-4      Resistance Training   Training Prescription Yes    Weight 4    Reps 10-15             Perform Capillary Blood Glucose checks as needed.  Exercise Prescription Changes:   Exercise Prescription Changes     Row Name 09/18/21 1400 10/04/21 1400 10/11/21 1400         Response to Exercise   Blood Pressure (Admit) 140/64 128/60 120/60     Blood Pressure (Exercise) 128/62 160/72 150/60     Blood Pressure (Exit) 120/62 130/60 120/60     Heart Rate (Admit) 68 bpm 82 bpm 78 bpm     Heart Rate (Exercise) 92 bpm 90 bpm 85 bpm     Heart Rate (Exit) 78 bpm 79 bpm 86 bpm     Oxygen Saturation (Admit) 93 % 94 % 96 %     Oxygen Saturation (Exercise) 93 % 93 % 94 %     Oxygen Saturation (Exit) 93 % 95 % 94 %     Rating of Perceived Exertion (Exercise) 11 12 12      Perceived Dyspnea (Exercise) 11 12 12      Duration Continue with 30 min of aerobic exercise without signs/symptoms of physical distress. Continue with 30 min of aerobic exercise without signs/symptoms of physical distress. Continue with 30 min of aerobic exercise without signs/symptoms of physical distress.     Intensity THRR unchanged THRR unchanged THRR unchanged       Progression   Progression Continue to progress workloads to maintain intensity without signs/symptoms of physical distress. Continue to progress workloads to maintain intensity without signs/symptoms of physical distress. Continue to progress workloads to maintain intensity without signs/symptoms of physical distress.       Resistance Training   Training Prescription Yes Yes Yes     Weight 4 4 4      Reps 10-15 10-15 10-15     Time 10 Minutes 10 Minutes 10 Minutes        Treadmill   MPH 1.8 2.5 2.5     Grade 0 0 0     Minutes 17 17 17      METs 2.38 2.91 2.91       Recumbant Elliptical   Level 1 2 1      RPM 53 57 54     Minutes 22 22 22      METs 3.3 3.8 3              Exercise Comments:   Exercise Goals and Review:   Exercise Goals     Row Name 09/05/21 1315 09/18/21 1425 10/16/21 1355         Exercise Goals   Increase Physical Activity Yes Yes Yes     Intervention Provide advice, education, support and counseling about physical activity/exercise needs.;Develop an individualized exercise prescription for aerobic and resistive training based on initial evaluation findings, risk stratification, comorbidities and participant's personal goals. Provide advice, education, support and counseling about physical activity/exercise needs.;Develop an individualized exercise prescription for aerobic and  resistive training based on initial evaluation findings, risk stratification, comorbidities and participant's personal goals. Provide advice, education, support and counseling about physical activity/exercise needs.;Develop an individualized exercise prescription for aerobic and resistive training based on initial evaluation findings, risk stratification, comorbidities and participant's personal goals.     Expected Outcomes Short Term: Attend rehab on a regular basis to increase amount of physical activity.;Long Term: Add in home exercise to make exercise part of routine and to increase amount of physical activity.;Long Term: Exercising regularly at least 3-5 days a week. Short Term: Attend rehab on a regular basis to increase amount of physical activity.;Long Term: Add in home exercise to make exercise part of routine and to increase amount of physical activity.;Long Term: Exercising regularly at least 3-5 days a week. Short Term: Attend rehab on a regular basis to increase amount of physical activity.;Long Term: Add in home exercise to make exercise part of  routine and to increase amount of physical activity.;Long Term: Exercising regularly at least 3-5 days a week.     Increase Strength and Stamina Yes Yes Yes     Intervention Provide advice, education, support and counseling about physical activity/exercise needs.;Develop an individualized exercise prescription for aerobic and resistive training based on initial evaluation findings, risk stratification, comorbidities and participant's personal goals. Provide advice, education, support and counseling about physical activity/exercise needs.;Develop an individualized exercise prescription for aerobic and resistive training based on initial evaluation findings, risk stratification, comorbidities and participant's personal goals. Provide advice, education, support and counseling about physical activity/exercise needs.;Develop an individualized exercise prescription for aerobic and resistive training based on initial evaluation findings, risk stratification, comorbidities and participant's personal goals.     Expected Outcomes Short Term: Increase workloads from initial exercise prescription for resistance, speed, and METs.;Short Term: Perform resistance training exercises routinely during rehab and add in resistance training at home;Long Term: Improve cardiorespiratory fitness, muscular endurance and strength as measured by increased METs and functional capacity (6MWT) Short Term: Increase workloads from initial exercise prescription for resistance, speed, and METs.;Short Term: Perform resistance training exercises routinely during rehab and add in resistance training at home;Long Term: Improve cardiorespiratory fitness, muscular endurance and strength as measured by increased METs and functional capacity (6MWT) Short Term: Increase workloads from initial exercise prescription for resistance, speed, and METs.;Short Term: Perform resistance training exercises routinely during rehab and add in resistance training at  home;Long Term: Improve cardiorespiratory fitness, muscular endurance and strength as measured by increased METs and functional capacity (6MWT)     Able to understand and use rate of perceived exertion (RPE) scale Yes Yes Yes     Intervention Provide education and explanation on how to use RPE scale Provide education and explanation on how to use RPE scale Provide education and explanation on how to use RPE scale     Expected Outcomes Short Term: Able to use RPE daily in rehab to express subjective intensity level;Long Term:  Able to use RPE to guide intensity level when exercising independently Short Term: Able to use RPE daily in rehab to express subjective intensity level;Long Term:  Able to use RPE to guide intensity level when exercising independently --     Able to understand and use Dyspnea scale Yes Yes Yes     Intervention Provide education and explanation on how to use Dyspnea scale Provide education and explanation on how to use Dyspnea scale Provide education and explanation on how to use Dyspnea scale     Expected Outcomes Short Term: Able to use  Dyspnea scale daily in rehab to express subjective sense of shortness of breath during exertion;Long Term: Able to use Dyspnea scale to guide intensity level when exercising independently Short Term: Able to use Dyspnea scale daily in rehab to express subjective sense of shortness of breath during exertion;Long Term: Able to use Dyspnea scale to guide intensity level when exercising independently Short Term: Able to use Dyspnea scale daily in rehab to express subjective sense of shortness of breath during exertion;Long Term: Able to use Dyspnea scale to guide intensity level when exercising independently     Knowledge and understanding of Target Heart Rate Range (THRR) Yes Yes Yes     Intervention Provide education and explanation of THRR including how the numbers were predicted and where they are located for reference Provide education and explanation  of THRR including how the numbers were predicted and where they are located for reference Provide education and explanation of THRR including how the numbers were predicted and where they are located for reference     Expected Outcomes Short Term: Able to state/look up THRR;Short Term: Able to use daily as guideline for intensity in rehab;Long Term: Able to use THRR to govern intensity when exercising independently Short Term: Able to state/look up THRR;Short Term: Able to use daily as guideline for intensity in rehab;Long Term: Able to use THRR to govern intensity when exercising independently Short Term: Able to state/look up THRR;Short Term: Able to use daily as guideline for intensity in rehab;Long Term: Able to use THRR to govern intensity when exercising independently     Understanding of Exercise Prescription Yes Yes Yes     Intervention Provide education, explanation, and written materials on patient's individual exercise prescription Provide education, explanation, and written materials on patient's individual exercise prescription Provide education, explanation, and written materials on patient's individual exercise prescription     Expected Outcomes Short Term: Able to explain program exercise prescription;Long Term: Able to explain home exercise prescription to exercise independently Short Term: Able to explain program exercise prescription;Long Term: Able to explain home exercise prescription to exercise independently Short Term: Able to explain program exercise prescription;Long Term: Able to explain home exercise prescription to exercise independently              Exercise Goals Re-Evaluation :  Exercise Goals Re-Evaluation     Row Name 09/18/21 1425 10/16/21 1355           Exercise Goal Re-Evaluation   Exercise Goals Review Increase Physical Activity;Increase Strength and Stamina;Able to understand and use rate of perceived exertion (RPE) scale;Able to understand and use Dyspnea  scale;Knowledge and understanding of Target Heart Rate Range (THRR);Understanding of Exercise Prescription Increase Strength and Stamina;Increase Physical Activity;Able to understand and use rate of perceived exertion (RPE) scale;Able to understand and use Dyspnea scale;Knowledge and understanding of Target Heart Rate Range (THRR);Understanding of Exercise Prescription      Comments Pt has completed 2 sessions of PR. He seems to enjoy coming to class so far and is eager to get going. He is increasing his speed on the treadmill and RPM on the ellp. He has a bike at home that he rides for 20 minutes a day along with doing weights and bands. He is currenlty exercising at 3.3 METs on the ellp. Will continue to monitor and progress as able. Pt has completed 9 sessions of PR. 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 He is increasing his speed on the treadmill and RPM on the ellp. He has been  tired recently due to [place] but pushes through. He is still exercising at home on days off from class and treatments. He is currently exercising at 3.0 METs on the ellp. Will continue to monitor and progress as able.      Expected Outcomes Through exercise at home and at rehab, the patient will meet their stated goals. Through exercise at home and at rehab, the patient will meet their stated goals.               Discharge Exercise Prescription (Final Exercise Prescription Changes):  Exercise Prescription Changes - 10/11/21 1400       Response to Exercise   Blood Pressure (Admit) 120/60    Blood Pressure (Exercise) 150/60    Blood Pressure (Exit) 120/60    Heart Rate (Admit) 78 bpm    Heart Rate (Exercise) 85 bpm    Heart Rate (Exit) 86 bpm    Oxygen Saturation (Admit) 96 %    Oxygen Saturation (Exercise) 94 %    Oxygen Saturation (Exit) 94 %    Rating of Perceived Exertion (Exercise) 12    Perceived Dyspnea (Exercise) 12    Duration Continue with 30 min of aerobic exercise without signs/symptoms of physical  distress.    Intensity THRR unchanged      Progression   Progression Continue to progress workloads to maintain intensity without signs/symptoms of physical distress.      Resistance Training   Training Prescription Yes    Weight 4    Reps 10-15    Time 10 Minutes      Treadmill   MPH 2.5    Grade 0    Minutes 17    METs 2.91      Recumbant Elliptical   Level 1    RPM 54    Minutes 22    METs 3             Nutrition:  Target Goals: Understanding of nutrition guidelines, daily intake of sodium 1500mg , cholesterol 200mg , calories 30% from fat and 7% or less from saturated fats, daily to have 5 or more servings of fruits and vegetables.  Biometrics:  Pre Biometrics - 09/05/21 1316       Pre Biometrics   Height 6' (1.829 m)    Weight 82.9 kg    Waist Circumference 40 inches    Hip Circumference 39 inches    Waist to Hip Ratio 1.03 %    BMI (Calculated) 24.78    Triceps Skinfold 10 mm    % Body Fat 24.8 %    Grip Strength 37.8 kg    Flexibility 0 in    Single Leg Stand 2 seconds              Nutrition Therapy Plan and Nutrition Goals:  Nutrition Therapy & Goals - 09/19/21 1137       Personal Nutrition Goals   Comments Patient scored 15 on his diet assessment. We offer 2 educational sessions on heart heatlhy nutrition with handouts.      Intervention Plan   Intervention Nutrition handout(s) given to patient.    Expected Outcomes Short Term Goal: Understand basic principles of dietary content, such as calories, fat, sodium, cholesterol and nutrients.             Nutrition Assessments:  Nutrition Assessments - 09/05/21 1314       MEDFICTS Scores   Pre Score 15            MEDIFICTS Score  Key: ?70 Need to make dietary changes  40-70 Heart Healthy Diet ? 40 Therapeutic Level Cholesterol Diet   Picture Your Plate Scores: <94 Unhealthy dietary pattern with much room for improvement. 41-50 Dietary pattern unlikely to meet  recommendations for good health and room for improvement. 51-60 More healthful dietary pattern, with some room for improvement.  >60 Healthy dietary pattern, although there may be some specific behaviors that could be improved.    Nutrition Goals Re-Evaluation:   Nutrition Goals Discharge (Final Nutrition Goals Re-Evaluation):   Psychosocial: Target Goals: Acknowledge presence or absence of significant depression and/or stress, maximize coping skills, provide positive support system. Participant is able to verbalize types and ability to use techniques and skills needed for reducing stress and depression.  Initial Review & Psychosocial Screening:  Initial Psych Review & Screening - 09/05/21 1311       Initial Review   Current issues with None Identified      Family Dynamics   Good Support System? Yes    Comments His support system is his wife.      Barriers   Psychosocial barriers to participate in program There are no identifiable barriers or psychosocial needs.      Screening Interventions   Interventions Encouraged to exercise    Expected Outcomes Long Term goal: The participant improves quality of Life and PHQ9 Scores as seen by post scores and/or verbalization of changes;Short Term goal: Identification and review with participant of any Quality of Life or Depression concerns found by scoring the questionnaire.             Quality of Life Scores:  Quality of Life - 09/05/21 1316       Quality of Life   Select Quality of Life      Quality of Life Scores   Health/Function Pre 27.56 %    Socioeconomic Pre 30 %    Psych/Spiritual Pre 30 %    Family Pre 30 %    GLOBAL Pre 28.85 %            Scores of 19 and below usually indicate a poorer quality of life in these areas.  A difference of  2-3 points is a clinically meaningful difference.  A difference of 2-3 points in the total score of the Quality of Life Index has been associated with significant improvement in  overall quality of life, self-image, physical symptoms, and general health in studies assessing change in quality of life.   PHQ-9: Review Flowsheet  More data exists      09/24/2021 09/05/2021 08/29/2021 09/18/2020 08/29/2020  Depression screen PHQ 2/9  Decreased Interest 0 0 0 0 0  Down, Depressed, Hopeless 0 0 0 0 0  PHQ - 2 Score 0 0 0 0 0  Altered sleeping 0 0 0 - 0  Tired, decreased energy 0 0 0 - 0  Change in appetite 0 0 0 - 0  Feeling bad or failure about yourself  0 0 0 - 0  Trouble concentrating 0 0 0 - 0  Moving slowly or fidgety/restless 0 0 0 - 0  Suicidal thoughts 0 0 0 - 0  PHQ-9 Score 0 0 0 - 0  Difficult doing work/chores Not difficult at all - Not difficult at all - Not difficult at all   Interpretation of Total Score  Total Score Depression Severity:  1-4 = Minimal depression, 5-9 = Mild depression, 10-14 = Moderate depression, 15-19 = Moderately severe depression, 20-27 = Severe depression  Psychosocial Evaluation and Intervention:  Psychosocial Evaluation - 09/05/21 1331       Psychosocial Evaluation & Interventions   Interventions Encouraged to exercise with the program and follow exercise prescription    Comments Pt has no barriers to participating in PR. He has no identifible psychosocial issues. He was recently diagnosed with lung cancer, stage one in the left lobe and stage three in the right lobe. He is participating in rehab at the advice of his surgeon, and he is set to start chemotherapy on 9/5. He will be taking chemotherapy until the end of October, and then he will meet with his surgeon again to set a date for his lobectomy. He scored a 0 on his PHQ-9,a nd he reports that he is in good spirits despite his recent diagnosis and upcoming surgery. He did request prayers, and did mention that his daughter passed away at the age of 27 a couple of years ago from cancer. He recently stopped smoking, as he had been smoking a pack of cigarettes per month for the  past 20 years. Before that he had smoked a pack per day for about 40 years. He reports that his wife is his support system. His goals for the program are to get stronger and to prepare his body for surgery. He is ready to start the program.    Expected Outcomes Pt will continue to have no identifiable psychosocial issues.    Continue Psychosocial Services  No Follow up required             Psychosocial Re-Evaluation:  Psychosocial Re-Evaluation     [place] Name 09/19/21 1133 10/08/21 1507           Psychosocial Re-Evaluation   Current issues with None Identified None Identified      Comments Patient is new to the program completing 3 sessions. He continues to have no psychosocial barriers or issues identified. We will continue to monitor. Patient has completed 7 sessions.  He continues to have no psychosocial barriers or issues identified.  He seems to enjoy coming to class and very interactiver with class and staff.  He demonstrates an interest in improving his health.   We will continue to monitor as he progresses in the program.      Expected Outcomes Patient will continue to have no psychosocial barriers or issues identified. Patient will continue to have no psychosocial barriers or issues identified.      Interventions Encouraged to attend Pulmonary Rehabilitation for the exercise;Relaxation education;Encouraged to attend Cardiac Rehabilitation for the exercise Encouraged to attend Pulmonary Rehabilitation for the exercise;Relaxation education;Encouraged to attend Cardiac Rehabilitation for the exercise      Continue Psychosocial Services  No Follow up required No Follow up required               Psychosocial Discharge (Final Psychosocial Re-Evaluation):  Psychosocial Re-Evaluation - 10/08/21 1507       Psychosocial Re-Evaluation   Current issues with None Identified    Comments Patient has completed 7 sessions.  He continues to have no psychosocial barriers or issues  identified.  He seems to enjoy coming to class and very interactiver with class and staff.  He demonstrates an interest in improving his health.   We will continue to monitor as he progresses in the program.    Expected Outcomes Patient will continue to have no psychosocial barriers or issues identified.    Interventions Encouraged to attend Pulmonary Rehabilitation for the exercise;Relaxation education;Encouraged to attend Cardiac  Rehabilitation for the exercise    Continue Psychosocial Services  No Follow up required              Education: Education Goals: Education classes will be provided on a weekly basis, covering required topics. Participant will state understanding/return demonstration of topics presented.  Learning Barriers/Preferences:  Learning Barriers/Preferences - 09/05/21 1300       Learning Barriers/Preferences   Learning Barriers None    Learning Preferences Audio             Education Topics: How Lungs Work and Diseases: - Discuss the anatomy of the lungs and diseases that can affect the lungs, such as COPD. Flowsheet Row PULMONARY REHAB OTHER RESPIRATORY from 10/11/2021 in [place]  Date 10/11/21  Educator DF  Instruction Review Code 2- Demonstrated Understanding       Exercise: -Discuss the importance of exercise, FITT principles of exercise, normal and abnormal responses to exercise, and how to exercise safely.   Environmental Irritants: -Discuss types of environmental irritants and how to limit exposure to environmental irritants.   Meds/Inhalers and oxygen: - Discuss respiratory medications, definition of an inhaler and oxygen, and the proper way to use an inhaler and oxygen.   Energy Saving Techniques: - Discuss methods to conserve energy and decrease shortness of breath when performing activities of daily living.    Bronchial Hygiene / Breathing Techniques: - Discuss breathing mechanics, pursed-lip breathing  technique,  proper posture, effective ways to clear airways, and other functional breathing techniques   Cleaning Equipment: - Provides group verbal and written instruction about the health risks of elevated stress, cause of high stress, and healthy ways to reduce stress.   Nutrition I: Fats: - Discuss the types of cholesterol, what cholesterol does to the body, and how cholesterol levels can be controlled.   Nutrition II: Labels: -Discuss the different components of food labels and how to read food labels.   Respiratory Infections: - Discuss the signs and symptoms of respiratory infections, ways to prevent respiratory infections, and the importance of seeking medical treatment when having a respiratory infection. Flowsheet Row PULMONARY REHAB OTHER RESPIRATORY from 10/11/2021 in [place]  Date 09/13/21  Educator DM  Instruction Review Code 1- Verbalizes Understanding       Stress I: Signs and Symptoms: - Discuss the causes of stress, how stress may lead to anxiety and depression, and ways to limit stress. Flowsheet Row PULMONARY REHAB OTHER RESPIRATORY from 10/11/2021 in [place]  Date 09/20/21  Educator [place]  Instruction Review Code 1- Verbalizes Understanding       Stress II: Relaxation: -Discuss relaxation techniques to limit stress. Flowsheet Row PULMONARY REHAB OTHER RESPIRATORY from 10/11/2021 in [place]  Date 09/27/21  Educator pb  Instruction Review Code 1- Verbalizes Understanding       Oxygen for Home/Travel: - Discuss how to prepare for travel when on oxygen and proper ways to transport and store oxygen to ensure safety. Flowsheet Row PULMONARY REHAB OTHER RESPIRATORY from 10/11/2021 in [place]  Date 10/04/21  Educator DF  Instruction Review Code 2- Demonstrated Understanding       Knowledge Questionnaire Score:  Knowledge Questionnaire Score - 09/05/21  1248       Knowledge Questionnaire Score   Pre Score 15/18             Core Components/Risk Factors/Patient Goals at Admission:  Personal Goals and Risk Factors at Admission - 09/05/21 1317  Core Components/Risk Factors/Patient Goals on Admission   Personal Goal Other Yes    Personal Goal Get stronger in preparation for upcoming lung surgery.    Intervention Attend pulmonary rehab twice per week and continue to exercise at home.    Expected Outcomes Pt will meet stated goals.             Core Components/Risk Factors/Patient Goals Review:   Goals and Risk Factor Review     Row Name 09/19/21 1134 10/08/21 1514           Core Components/Risk Factors/Patient Goals Review   Personal Goals Review Other Other      Review Patient was referred to PR with malignant neoplasm of Left upper lobe of lung. He has completed 3 sessions and is doing well in the program with consistent attendance. His personal goals for the program are to get stronger and prepare his lungs for surgery for cancer. He is currently receiving chemotherapy. We will continue to monitor his progress as he works towards meeting these personal goals. Patient has completed 7 sessions and is doing well in the program with consistent attendance. His personal goals for the program are to get stronger and prepare his lungs for surgery for cancer. He is currently receiving chemotherapy.  He is tolerating exercise well on the Tm and recumbent elliptical and his O2 sats averaging around 93%-96% while exercising on room air.  We will continue to encourage his progress as he continues to works towards meeting these personal goals.      Expected Outcomes Patient will complete the program meeting both personal and program goals. Patient will complete the program meeting both personal and program goals.               Core Components/Risk Factors/Patient Goals at Discharge (Final Review):   Goals and Risk Factor Review -  10/08/21 1514       Core Components/Risk Factors/Patient Goals Review   Personal Goals Review Other    Review Patient has completed 7 sessions and is doing well in the program with consistent attendance. His personal goals for the program are to get stronger and prepare his lungs for surgery for cancer. He is currently receiving chemotherapy.  He is tolerating exercise well on the Tm and recumbent elliptical and his O2 sats averaging around 93%-96% while exercising on room air.  We will continue to encourage his progress as he continues to works towards meeting these personal goals.    Expected Outcomes Patient will complete the program meeting both personal and program goals.             ITP Comments:   Comments: ITP REVIEW Pt is making expected progress toward pulmonary rehab goals after completing 10 sessions. Recommend continued exercise, life style modification, education, and utilization of breathing techniques to increase stamina and strength and decrease shortness of breath with exertion.

## 2021-10-18 ENCOUNTER — Encounter (HOSPITAL_COMMUNITY)
Admission: RE | Admit: 2021-10-18 | Discharge: 2021-10-18 | Disposition: A | Discharge status: Home / Self Care | Payer: Medicare Other | Source: Ambulatory Visit | Attending: Oncology | Admitting: Oncology

## 2021-10-18 DIAGNOSIS — C3412 Malignant neoplasm of upper lobe, left bronchus or lung: Secondary | ICD-10-CM

## 2021-10-18 NOTE — Progress Notes (Signed)
Daily Session Note  Patient Details  Name: Patrick Harris MRN: [id number] Date of Birth: 01/04/1946 Referring Provider:   [place] from 09/05/2021 in [place]  Referring Provider Dr. Loletta Specter       Encounter Date: 10/18/2021  Check In:  Session Check In - 10/18/21 1330       Check-In   Supervising physician immediately available to respond to emergencies [facility] MD immediately available    Physician(s) Dr Gardiner Rhyme    Location [facility]    Staff Present Redge Gainer, BS, Exercise Physiologist;Olivea Sonnen Hassell Done, RN, BSN    Virtual Visit No    Medication changes reported     No    Fall or balance concerns reported    No    Tobacco Cessation No Change    Warm-up and Cool-down Performed as group-led instruction    Resistance Training Performed Yes    VAD Patient? No    PAD/SET Patient? No      Pain Assessment   Currently in Pain? No/denies    Multiple Pain Sites No             Capillary Blood Glucose: No results found for this or any previous visit (from the past 24 hour(s)).    Social History   Tobacco Use  Smoking Status Some Days   Types: Cigarettes   Last attempt to quit: 01/07/1998   Years since quitting: 23.7  Smokeless Tobacco Never  Tobacco Comments   Smokes "every once in a while"   07/25/21 ARJ     Goals Met:  Proper associated with RPD/PD & O2 Sat Independence with exercise equipment Using PLB without cueing & demonstrates good technique Exercise tolerated well Queuing for purse lip breathing No report of concerns or symptoms today Strength training completed today  Goals Unmet:  Not Applicable  Comments: checkout at 1430.   Dr. Kathie Dike is Medical Director for [facility] Pulmonary Rehab.

## 2021-10-22 DIAGNOSIS — Z1159 Encounter for screening for other viral diseases: Secondary | ICD-10-CM | POA: Diagnosis not present

## 2021-10-22 DIAGNOSIS — C3412 Malignant neoplasm of upper lobe, left bronchus or lung: Secondary | ICD-10-CM | POA: Diagnosis not present

## 2021-10-22 DIAGNOSIS — C3411 Malignant neoplasm of upper lobe, right bronchus or lung: Secondary | ICD-10-CM | POA: Diagnosis not present

## 2021-10-23 ENCOUNTER — Encounter (HOSPITAL_COMMUNITY): Payer: Medicare Other

## 2021-10-23 DIAGNOSIS — Z5112 Encounter for antineoplastic immunotherapy: Secondary | ICD-10-CM | POA: Diagnosis not present

## 2021-10-23 DIAGNOSIS — Z1159 Encounter for screening for other viral diseases: Secondary | ICD-10-CM | POA: Diagnosis not present

## 2021-10-23 DIAGNOSIS — C3411 Malignant neoplasm of upper lobe, right bronchus or lung: Secondary | ICD-10-CM | POA: Diagnosis not present

## 2021-10-23 DIAGNOSIS — Z5111 Encounter for antineoplastic chemotherapy: Secondary | ICD-10-CM | POA: Diagnosis not present

## 2021-10-25 ENCOUNTER — Encounter (HOSPITAL_COMMUNITY)
Admission: RE | Admit: 2021-10-25 | Discharge: 2021-10-25 | Disposition: A | Discharge status: Home / Self Care | Payer: Medicare Other | Source: Ambulatory Visit | Attending: Oncology | Admitting: Oncology

## 2021-10-25 DIAGNOSIS — C3412 Malignant neoplasm of upper lobe, left bronchus or lung: Secondary | ICD-10-CM | POA: Diagnosis not present

## 2021-10-25 NOTE — Progress Notes (Signed)
Daily Session Note  Patient Details  Name: Patrick Harris MRN: [id number] Date of Birth: Jan 20, 1945 Referring Provider:   Flowsheet Row PULMONARY REHAB OTHER RESP ORIENTATION from 09/05/2021 in [place]  Referring Provider Dr. Loletta Specter       Encounter Date: 10/25/2021  Check In:  Session Check In - 10/25/21 1324       Check-In   Supervising physician immediately available to respond to emergencies [facility] immediately available    Physician(s) Dr. Dellia Cloud    Location [facility]    Staff Present Redge Gainer, BS, Exercise Physiologist;Dalton Kris Mouton, MS, ACSM-CEP, Exercise Physiologist;Other    Virtual Visit No    Medication changes reported     No    Fall or balance concerns reported    No    Tobacco Cessation No Change    Warm-up and Cool-down Performed as group-led instruction    Resistance Training Performed Yes    VAD Patient? No    PAD/SET Patient? No      Pain Assessment   Currently in Pain? No/denies    Multiple Pain Sites No             Capillary Blood Glucose: No results found for this or any previous visit (from the past 24 hour(s)).    Social History   Tobacco Use  Smoking Status Some Days   Types: Cigarettes   Last attempt to quit: 01/07/1998   Years since quitting: 23.8  Smokeless Tobacco Never  Tobacco Comments   Smokes "every once in a while"   07/25/21 ARJ     Goals Met:  Proper associated with RPD/PD & O2 Sat Independence with exercise equipment Using PLB without cueing & demonstrates good technique Exercise tolerated well Queuing for purse lip breathing No report of concerns or symptoms today Strength training completed today  Goals Unmet:  Not Applicable  Comments: check out at 2:30   Dr. Kathie Dike is Medical Director for [facility] Pulmonary Rehab.

## 2021-10-26 ENCOUNTER — Encounter: Payer: Self-pay | Admitting: Gastroenterology

## 2021-10-30 ENCOUNTER — Other Ambulatory Visit: Payer: Self-pay | Admitting: Internal Medicine

## 2021-10-30 ENCOUNTER — Encounter (HOSPITAL_COMMUNITY)
Admission: RE | Admit: 2021-10-30 | Discharge: 2021-10-30 | Disposition: A | Discharge status: Home / Self Care | Payer: Medicare Other | Source: Ambulatory Visit | Attending: Oncology | Admitting: Oncology

## 2021-10-30 VITALS — Wt 183.2 lb

## 2021-10-30 DIAGNOSIS — C3412 Malignant neoplasm of upper lobe, left bronchus or lung: Secondary | ICD-10-CM | POA: Diagnosis not present

## 2021-10-30 NOTE — Progress Notes (Signed)
Daily Session Note  Patient Details  Name: GRANVEL PROUDFOOT MRN: [id number] Date of Birth: 1945-06-10 Referring Provider:   [place] from 09/05/2021 in [place]  Referring Provider Dr. Loletta Specter       Encounter Date: 10/30/2021  Check In:  Session Check In - 10/30/21 1326       Check-In   Supervising physician immediately available to respond to emergencies [facility] MD immediately available    Physician(s) Dr Harl Bowie    Location [facility]    Staff Present Redge Gainer, BS, Exercise Physiologist;Nathalia Wismer Hassell Done, RN, BSN    Virtual Visit No    Medication changes reported     No    Fall or balance concerns reported    No    Tobacco Cessation No Change    Warm-up and Cool-down Performed as group-led instruction    Resistance Training Performed Yes    VAD Patient? No    PAD/SET Patient? No      Pain Assessment   Currently in Pain? No/denies    Multiple Pain Sites No             Capillary Blood Glucose: No results found for this or any previous visit (from the past 24 hour(s)).    Social History   Tobacco Use  Smoking Status Some Days   Types: Cigarettes   Last attempt to quit: 01/07/1998   Years since quitting: 23.8  Smokeless Tobacco Never  Tobacco Comments   Smokes "every once in a while"   07/25/21 ARJ     Goals Met:  Proper associated with RPD/PD & O2 Sat Independence with exercise equipment Using PLB without cueing & demonstrates good technique Exercise tolerated well Queuing for purse lip breathing No report of concerns or symptoms today Strength training completed today  Goals Unmet:  Not Applicable  Comments: Checkout at 1430.   Dr. Kathie Dike is Medical Director for [facility] Pulmonary Rehab.

## 2021-11-01 ENCOUNTER — Encounter (HOSPITAL_COMMUNITY)
Admission: RE | Admit: 2021-11-01 | Discharge: 2021-11-01 | Disposition: A | Discharge status: Home / Self Care | Payer: Medicare Other | Source: Ambulatory Visit | Attending: Oncology | Admitting: Oncology

## 2021-11-01 DIAGNOSIS — C3412 Malignant neoplasm of upper lobe, left bronchus or lung: Secondary | ICD-10-CM

## 2021-11-01 NOTE — Progress Notes (Signed)
Daily Session Note  Patient Details  Name: Patrick Harris MRN: [id number] Date of Birth: January 04, 1946 Referring Provider:   Flowsheet Row PULMONARY REHAB OTHER RESP ORIENTATION from 09/05/2021 in [place]  Referring Provider Dr. Loletta Specter       Encounter Date: 11/01/2021  Check In:  Session Check In - 11/01/21 1330       Check-In   Supervising physician immediately available to respond to emergencies [facility] MD immediately available    Physician(s) Dr Harl Bowie    Location [facility]    Staff Present Leana Roe, BS, Exercise Physiologist;Herschell Virani BSN, RN;Dalton Sherrie George, MS, ACSM-CEP    Virtual Visit No    Medication changes reported     No    Fall or balance concerns reported    No    Tobacco Cessation No Change    Warm-up and Cool-down Performed as group-led instruction    Resistance Training Performed Yes    VAD Patient? No    PAD/SET Patient? No      Pain Assessment   Currently in Pain? Yes    Pain Score 3     Pain Location Knee    Pain Orientation Right    Pain Descriptors / Indicators Sharp    Pain Type Acute pain    Pain Frequency Intermittent    Multiple Pain Sites No             Capillary Blood Glucose: No results found for this or any previous visit (from the past 24 hour(s)).    Social History   Tobacco Use  Smoking Status Some Days   Types: Cigarettes   Last attempt to quit: 01/07/1998   Years since quitting: 23.8  Smokeless Tobacco Never  Tobacco Comments   Smokes "every once in a while"   07/25/21 ARJ     Goals Met:  Proper associated with RPD/PD & O2 Sat Independence with exercise equipment Using PLB without cueing & demonstrates good technique Exercise tolerated well Queuing for purse lip breathing No report of concerns or symptoms today Strength training completed today  Goals Unmet:  Not Applicable  Comments: check out at 2:45   Dr. Kathie Dike is Medical Director for  [facility] Pulmonary Rehab.

## 2021-11-06 ENCOUNTER — Encounter (HOSPITAL_COMMUNITY)
Admission: RE | Admit: 2021-11-06 | Discharge: 2021-11-06 | Disposition: A | Discharge status: Home / Self Care | Payer: Medicare Other | Source: Ambulatory Visit | Attending: Oncology | Admitting: Oncology

## 2021-11-06 DIAGNOSIS — J439 Emphysema, unspecified: Secondary | ICD-10-CM | POA: Diagnosis not present

## 2021-11-06 DIAGNOSIS — C3412 Malignant neoplasm of upper lobe, left bronchus or lung: Secondary | ICD-10-CM

## 2021-11-06 DIAGNOSIS — C3490 Malignant neoplasm of unspecified part of unspecified bronchus or lung: Secondary | ICD-10-CM | POA: Diagnosis not present

## 2021-11-06 DIAGNOSIS — I700 Atherosclerosis of aorta: Secondary | ICD-10-CM | POA: Diagnosis not present

## 2021-11-06 NOTE — Progress Notes (Signed)
Daily Session Note  Patient Details  Name: NASON CONRADT MRN: [id number] Date of Birth: [birth year] Referring Provider:   [place] from 09/05/2021 in [place]  Referring Provider Dr. Loletta Specter       Encounter Date: 11/06/2021  Check In:  Session Check In - 11/06/21 1325       Check-In   Supervising physician immediately available to respond to emergencies [facility] MD immediately available    Physician(s) Dr Domenic Polite    Location [facility]    Staff Present Leana Roe, BS, Exercise Physiologist;Geneve Kimpel Hassell Done, RN, BSN    Virtual Visit No    Medication changes reported     No    Fall or balance concerns reported    No    Tobacco Cessation No Change    Warm-up and Cool-down Performed as group-led instruction    Resistance Training Performed Yes    VAD Patient? No    PAD/SET Patient? No      Pain Assessment   Currently in Pain? No/denies    Pain Score 0-No pain    Multiple Pain Sites No             Capillary Blood Glucose: No results found for this or any previous visit (from the past 24 hour(s)).    Social History   Tobacco Use  Smoking Status Some Days   Types: Cigarettes   Last attempt to quit: 01/07/1998   Years since quitting: 23.8  Smokeless Tobacco Never  Tobacco Comments   Smokes "every once in a while"   07/25/21 ARJ     Goals Met:  Proper associated with RPD/PD & O2 Sat Independence with exercise equipment Using PLB without cueing & demonstrates good technique Exercise tolerated well Queuing for purse lip breathing No report of concerns or symptoms today Strength training completed today  Goals Unmet:  Not Applicable  Comments: Checkout at 1430.   Dr. Kathie Dike is Medical Director for [facility] Pulmonary Rehab.

## 2021-11-08 ENCOUNTER — Encounter (HOSPITAL_COMMUNITY)
Admission: RE | Admit: 2021-11-08 | Discharge: 2021-11-08 | Disposition: A | Discharge status: Home / Self Care | Payer: Medicare Other | Source: Ambulatory Visit | Attending: Oncology | Admitting: Oncology

## 2021-11-08 DIAGNOSIS — C3412 Malignant neoplasm of upper lobe, left bronchus or lung: Secondary | ICD-10-CM | POA: Insufficient documentation

## 2021-11-08 NOTE — Progress Notes (Signed)
Daily Session Note  Patient Details  Name: JAVARIUS TSOSIE MRN: [id number] Date of Birth: Oct 13, 1945 Referring Provider:   [place] from 09/05/2021 in [place]  Referring Provider Dr. Loletta Specter       Encounter Date: 11/08/2021  Check In:  Session Check In - 11/08/21 1328       Check-In   Supervising physician immediately available to respond to emergencies [facility] MD immediately available    Physician(s) Dr Domenic Polite    Location [facility]    Staff Present Leana Roe, BS, Exercise Physiologist;Tate Zagal BSN, RN;Daphyne Hassell Done, RN, BSN    Virtual Visit No    Medication changes reported     No    Fall or balance concerns reported    No    Tobacco Cessation No Change    Warm-up and Cool-down Performed as group-led instruction    Resistance Training Performed Yes    VAD Patient? No    PAD/SET Patient? No      Pain Assessment   Currently in Pain? No/denies    Pain Score 0-No pain    Multiple Pain Sites No             Capillary Blood Glucose: No results found for this or any previous visit (from the past 24 hour(s)).    Social History   Tobacco Use  Smoking Status Some Days   Types: Cigarettes   Last attempt to quit: 01/07/1998   Years since quitting: 23.8  Smokeless Tobacco Never  Tobacco Comments   Smokes "every once in a while"   07/25/21 ARJ     Goals Met:  Proper associated with RPD/PD & O2 Sat Independence with exercise equipment Using PLB without cueing & demonstrates good technique Exercise tolerated well Queuing for purse lip breathing No report of concerns or symptoms today Strength training completed today  Goals Unmet:  Not Applicable  Comments: check out at 2:30   Dr. Kathie Dike is Medical Director for [facility] Pulmonary Rehab.

## 2021-11-12 DIAGNOSIS — G4700 Insomnia, unspecified: Secondary | ICD-10-CM | POA: Diagnosis not present

## 2021-11-12 DIAGNOSIS — Z9221 Personal history of antineoplastic chemotherapy: Secondary | ICD-10-CM | POA: Diagnosis not present

## 2021-11-12 DIAGNOSIS — J939 Pneumothorax, unspecified: Secondary | ICD-10-CM | POA: Diagnosis not present

## 2021-11-12 DIAGNOSIS — Z902 Acquired absence of lung [part of]: Secondary | ICD-10-CM | POA: Diagnosis not present

## 2021-11-12 DIAGNOSIS — Z9841 Cataract extraction status, right eye: Secondary | ICD-10-CM | POA: Diagnosis not present

## 2021-11-12 DIAGNOSIS — H43811 Vitreous degeneration, right eye: Secondary | ICD-10-CM | POA: Diagnosis not present

## 2021-11-12 DIAGNOSIS — R0602 Shortness of breath: Secondary | ICD-10-CM | POA: Diagnosis not present

## 2021-11-12 DIAGNOSIS — J9601 Acute respiratory failure with hypoxia: Secondary | ICD-10-CM | POA: Diagnosis not present

## 2021-11-12 DIAGNOSIS — Z7982 Long term (current) use of aspirin: Secondary | ICD-10-CM | POA: Diagnosis not present

## 2021-11-12 DIAGNOSIS — R0902 Hypoxemia: Secondary | ICD-10-CM | POA: Diagnosis not present

## 2021-11-12 DIAGNOSIS — I2699 Other pulmonary embolism without acute cor pulmonale: Secondary | ICD-10-CM | POA: Diagnosis not present

## 2021-11-12 DIAGNOSIS — Z951 Presence of aortocoronary bypass graft: Secondary | ICD-10-CM | POA: Diagnosis not present

## 2021-11-12 DIAGNOSIS — N390 Urinary tract infection, site not specified: Secondary | ICD-10-CM | POA: Diagnosis not present

## 2021-11-12 DIAGNOSIS — C3490 Malignant neoplasm of unspecified part of unspecified bronchus or lung: Secondary | ICD-10-CM | POA: Diagnosis not present

## 2021-11-12 DIAGNOSIS — J84115 Respiratory bronchiolitis interstitial lung disease: Secondary | ICD-10-CM | POA: Diagnosis not present

## 2021-11-12 DIAGNOSIS — R0682 Tachypnea, not elsewhere classified: Secondary | ICD-10-CM | POA: Diagnosis not present

## 2021-11-12 DIAGNOSIS — C771 Secondary and unspecified malignant neoplasm of intrathoracic lymph nodes: Secondary | ICD-10-CM | POA: Diagnosis not present

## 2021-11-12 DIAGNOSIS — Z87891 Personal history of nicotine dependence: Secondary | ICD-10-CM | POA: Diagnosis not present

## 2021-11-12 DIAGNOSIS — I4891 Unspecified atrial fibrillation: Secondary | ICD-10-CM | POA: Diagnosis not present

## 2021-11-12 DIAGNOSIS — Z85828 Personal history of other malignant neoplasm of skin: Secondary | ICD-10-CM | POA: Diagnosis not present

## 2021-11-12 DIAGNOSIS — Z91040 Latex allergy status: Secondary | ICD-10-CM | POA: Diagnosis not present

## 2021-11-12 DIAGNOSIS — G8918 Other acute postprocedural pain: Secondary | ICD-10-CM | POA: Diagnosis not present

## 2021-11-12 DIAGNOSIS — C3491 Malignant neoplasm of unspecified part of right bronchus or lung: Secondary | ICD-10-CM | POA: Diagnosis not present

## 2021-11-12 DIAGNOSIS — Z8679 Personal history of other diseases of the circulatory system: Secondary | ICD-10-CM | POA: Diagnosis not present

## 2021-11-12 DIAGNOSIS — J982 Interstitial emphysema: Secondary | ICD-10-CM | POA: Diagnosis not present

## 2021-11-12 DIAGNOSIS — J439 Emphysema, unspecified: Secondary | ICD-10-CM | POA: Diagnosis not present

## 2021-11-12 DIAGNOSIS — Z01818 Encounter for other preprocedural examination: Secondary | ICD-10-CM | POA: Diagnosis not present

## 2021-11-12 DIAGNOSIS — C3412 Malignant neoplasm of upper lobe, left bronchus or lung: Secondary | ICD-10-CM | POA: Diagnosis not present

## 2021-11-12 DIAGNOSIS — Z4682 Encounter for fitting and adjustment of non-vascular catheter: Secondary | ICD-10-CM | POA: Diagnosis not present

## 2021-11-12 DIAGNOSIS — E222 Syndrome of inappropriate secretion of antidiuretic hormone: Secondary | ICD-10-CM | POA: Diagnosis not present

## 2021-11-12 DIAGNOSIS — T797XXA Traumatic subcutaneous emphysema, initial encounter: Secondary | ICD-10-CM | POA: Diagnosis not present

## 2021-11-12 DIAGNOSIS — J948 Other specified pleural conditions: Secondary | ICD-10-CM | POA: Diagnosis not present

## 2021-11-12 DIAGNOSIS — R918 Other nonspecific abnormal finding of lung field: Secondary | ICD-10-CM | POA: Diagnosis not present

## 2021-11-12 DIAGNOSIS — J9383 Other pneumothorax: Secondary | ICD-10-CM | POA: Diagnosis not present

## 2021-11-12 DIAGNOSIS — G4733 Obstructive sleep apnea (adult) (pediatric): Secondary | ICD-10-CM | POA: Diagnosis not present

## 2021-11-12 DIAGNOSIS — Z452 Encounter for adjustment and management of vascular access device: Secondary | ICD-10-CM | POA: Diagnosis not present

## 2021-11-12 DIAGNOSIS — B965 Pseudomonas (aeruginosa) (mallei) (pseudomallei) as the cause of diseases classified elsewhere: Secondary | ICD-10-CM | POA: Diagnosis not present

## 2021-11-12 DIAGNOSIS — E871 Hypo-osmolality and hyponatremia: Secondary | ICD-10-CM | POA: Diagnosis not present

## 2021-11-12 DIAGNOSIS — Z888 Allergy status to other drugs, medicaments and biological substances status: Secondary | ICD-10-CM | POA: Diagnosis not present

## 2021-11-12 DIAGNOSIS — C3411 Malignant neoplasm of upper lobe, right bronchus or lung: Secondary | ICD-10-CM | POA: Diagnosis not present

## 2021-11-12 DIAGNOSIS — T8182XA Emphysema (subcutaneous) resulting from a procedure, initial encounter: Secondary | ICD-10-CM | POA: Diagnosis not present

## 2021-11-12 DIAGNOSIS — I2510 Atherosclerotic heart disease of native coronary artery without angina pectoris: Secondary | ICD-10-CM | POA: Diagnosis not present

## 2021-11-12 DIAGNOSIS — E785 Hyperlipidemia, unspecified: Secondary | ICD-10-CM | POA: Diagnosis not present

## 2021-11-12 DIAGNOSIS — J9811 Atelectasis: Secondary | ICD-10-CM | POA: Diagnosis not present

## 2021-11-12 DIAGNOSIS — J95812 Postprocedural air leak: Secondary | ICD-10-CM | POA: Diagnosis not present

## 2021-11-12 DIAGNOSIS — H43812 Vitreous degeneration, left eye: Secondary | ICD-10-CM | POA: Diagnosis not present

## 2021-11-12 DIAGNOSIS — I10 Essential (primary) hypertension: Secondary | ICD-10-CM | POA: Diagnosis not present

## 2021-11-12 DIAGNOSIS — Z9889 Other specified postprocedural states: Secondary | ICD-10-CM | POA: Diagnosis not present

## 2021-11-12 DIAGNOSIS — Z79899 Other long term (current) drug therapy: Secondary | ICD-10-CM | POA: Diagnosis not present

## 2021-11-12 DIAGNOSIS — M160 Bilateral primary osteoarthritis of hip: Secondary | ICD-10-CM | POA: Diagnosis not present

## 2021-11-13 DIAGNOSIS — C771 Secondary and unspecified malignant neoplasm of intrathoracic lymph nodes: Secondary | ICD-10-CM | POA: Diagnosis not present

## 2021-11-13 DIAGNOSIS — C3411 Malignant neoplasm of upper lobe, right bronchus or lung: Secondary | ICD-10-CM | POA: Diagnosis not present

## 2021-11-13 DIAGNOSIS — J439 Emphysema, unspecified: Secondary | ICD-10-CM | POA: Diagnosis not present

## 2021-11-13 DIAGNOSIS — Z9221 Personal history of antineoplastic chemotherapy: Secondary | ICD-10-CM | POA: Diagnosis not present

## 2021-11-13 DIAGNOSIS — Z4682 Encounter for fitting and adjustment of non-vascular catheter: Secondary | ICD-10-CM | POA: Diagnosis not present

## 2021-11-13 DIAGNOSIS — J84115 Respiratory bronchiolitis interstitial lung disease: Secondary | ICD-10-CM | POA: Diagnosis not present

## 2021-11-13 HISTORY — PX: LOBECTOMY: SHX5089

## 2021-11-14 DIAGNOSIS — Z4682 Encounter for fitting and adjustment of non-vascular catheter: Secondary | ICD-10-CM | POA: Diagnosis not present

## 2021-11-14 DIAGNOSIS — C3411 Malignant neoplasm of upper lobe, right bronchus or lung: Secondary | ICD-10-CM | POA: Diagnosis not present

## 2021-11-14 DIAGNOSIS — J939 Pneumothorax, unspecified: Secondary | ICD-10-CM | POA: Diagnosis not present

## 2021-11-14 NOTE — Progress Notes (Signed)
Pulmonary Individual Treatment Plan  Patient Details  Name: Patrick Harris MRN: [id number] Date of Birth: 05/20/1945 Referring Provider:   [facility] from 09/05/2021 in [facility]  Referring Provider Dr. Loletta Specter       Initial Encounter Date:  Flowsheet Row PULMONARY REHAB OTHER RESP ORIENTATION from 09/05/2021 in [facility]  Date 09/05/21       Visit Diagnosis: Malignant neoplasm of upper lobe of left lung ([place])  Patient's Home Medications on Admission:   Current Outpatient Medications:    acetaminophen (TYLENOL) 650 MG CR tablet, Take 1,300 mg by mouth every 8 (eight) hours as needed for pain., Disp: , Rfl:    aspirin EC 81 MG tablet, Take 1 tablet (81 mg total) by mouth at bedtime. Okay to restart this medication on 07/03/2021, Disp: 30 tablet, Rfl: 11   Cholecalciferol (VITAMIN D3) 2000 UNITS TABS, Take 2,000 Units by mouth in the morning and at bedtime., Disp: , Rfl:    Coenzyme Q10 (COQ-10) 100 MG CAPS, Take 100 mg by mouth in the morning., Disp: , Rfl:    Cyanocobalamin (B-12 PO), Take 1 tablet by mouth daily., Disp: , Rfl:    Garlic 2671 MG CAPS, Take 1,000 mg by mouth in the morning., Disp: , Rfl:    hydrALAZINE (APRESOLINE) 25 MG tablet, Take 1 tablet (25 mg total) by mouth 3 (three) times daily., Disp: 270 tablet, Rfl: 3   irbesartan (AVAPRO) 300 MG tablet, Take 1 tablet (300 mg total) by mouth daily., Disp: 90 tablet, Rfl: 3   Menaquinone-7 (VITAMIN K2) 100 MCG CAPS, Take 100 mcg by mouth in the morning., Disp: , Rfl:    nitroGLYCERIN (NITROSTAT) 0.4 MG SL tablet, Place 1 tablet (0.4 mg total) under the tongue every 5 (five) minutes x 3 doses as needed for chest pain (if no relief after 2nd dose, proceed to ED or call 911)., Disp: 25 tablet, Rfl: 3   Omega-3 Fatty Acids (OMEGA-3 FISH OIL PO), Take 1,000 mg by mouth in the morning., Disp: , Rfl:    omeprazole (PRILOSEC) 40 MG capsule,  Take 1 capsule (40 mg total) by mouth in the morning and at bedtime. (Patient taking differently: Take 40 mg by mouth daily.), Disp: 60 capsule, Rfl: 11   ondansetron (ZOFRAN) 8 MG tablet, Take 8 mg by mouth every 8 (eight) hours as needed., Disp: , Rfl:    Polyethyl Glycol-Propyl Glycol (LUBRICANT EYE DROPS) 0.4-0.3 % SOLN, Place 1-2 drops into both eyes 3 (three) times daily as needed (dry/irritated eyes.)., Disp: , Rfl:    pyrithione zinc (SELSUN BLUE DRY SCALP) 1 % shampoo, Apply 1 application  topically daily., Disp: , Rfl:    rosuvastatin (CRESTOR) 20 MG tablet, TAKE HALF A TABLET (10 MG) BY MOUTH ONCE DAILY., Disp: 45 tablet, Rfl: 3   tamsulosin (FLOMAX) 0.4 MG CAPS capsule, Take 0.4 mg by mouth in the morning., Disp: , Rfl:    Testosterone 20.25 MG/ACT (1.62%) GEL, Apply 2 Pump topically in the morning., Disp: , Rfl:    TURMERIC PO, Take 1,000 mg by mouth in the morning., Disp: , Rfl:    vitamin C (ASCORBIC ACID) 500 MG tablet, Take 500 mg by mouth in the morning., Disp: , Rfl:   Past Medical History: Past Medical History:  Diagnosis Date   Arthritis    CAD (coronary artery disease)    CABG with LIMA to LAD and RIMA to RCA August 2000 - Dr.  Hendrickson   Cancer [facility])    basal cell   CHF (congestive heart failure) (H[facility])    COPD (chronic obstructive pulmonary disease) (H[facility])    Elevated glycated hemoglobin    Essential hypertension    GERD (gastroesophageal reflux disease)    Heart murmur    Hyperlipidemia    Hypopituitarism ([employer])    Lung cancer ([place])    Osteopenia    Dr Wilson Singer   Pancreatitis    Peyronie disease    Testosterone deficiency    Dr Wilson Singer    Tobacco Use: Social History   Tobacco Use  Smoking Status Some Days   Types: Cigarettes   Last attempt to quit: 01/07/1998   Years since quitting: 23.8  Smokeless Tobacco Never  Tobacco Comments   Smokes "every once in a while"   07/25/21 [facility]     Labs: Review Flowsheet  More data exists      Latest Ref Rng & Units  08/22/2017 12/23/2017 08/24/2018 08/25/2019 08/29/2020  Labs for ITP Cardiac and Pulmonary Rehab  Cholestrol 0 - 200 mg/dL 128  137  146  122  118   LDL (calc) 0 - 99 mg/dL 71  81  86  71  65   HDL-C >39.00 mg/dL 29.10  36  34.20  33  33.60   Trlycerides 0.0 - 149.0 mg/dL 140.0  101  130.0  98  96.0   Hemoglobin A1c 4.6 - 6.5 % 6.2  5.9  6.1  5.5  6.0     Capillary Blood Glucose: Lab Results  Component Value Date   GLUCAP 117 (H) 07/23/2021     Pulmonary Assessment Scores:  Pulmonary Assessment Scores     Row Name 09/05/21 1249         ADL UCSD   ADL Phase Entry     SOB Score total 16     Rest 0     Walk 2     Stairs 3     Bath 0     Dress 0     Shop 0       CAT Score   CAT Score 16       mMRC Score   mMRC Score 0             UCSD: Self-administered rating of dyspnea associated with activities of daily living (ADLs) 6-point scale (0 = "not at all" to 5 = "maximal or unable to do because of breathlessness")  Scoring Scores range from 0 to 120.  Minimally important difference is 5 units  CAT: CAT can identify the health impairment of COPD patients and is better correlated with disease progression.  CAT has a scoring range of zero to 40. The CAT score is classified into four groups of low (less than 10), medium (10 - 20), high (21-30) and very high (31-40) based on the impact level of disease on health status. A CAT score over 10 suggests significant symptoms.  A worsening CAT score could be explained by an exacerbation, poor medication adherence, poor inhaler technique, or progression of COPD or comorbid conditions.  CAT MCID is 2 points  mMRC: mMRC (Modified Medical Research Council) Dyspnea Scale is used to assess the degree of baseline functional disability in patients of respiratory disease due to dyspnea. No minimal important difference is established. A decrease in score of 1 point or greater is considered a positive change.   Pulmonary Function  Assessment:   Exercise Target Goals: Exercise Program Goal: Individual exercise prescription  set using results from initial 6 min walk test and THRR while considering  patient's activity barriers and safety.   Exercise Prescription Goal: Initial exercise prescription builds to 30-45 minutes a day of aerobic activity, 2-3 days per week.  Home exercise guidelines will be given to patient during program as part of exercise prescription that the participant will acknowledge.  Activity Barriers & Risk Stratification:  Activity Barriers & Cardiac Risk Stratification - 09/05/21 1310       Activity Barriers & Cardiac Risk Stratification   Activity Barriers Neck/Spine Problems;Joint Problems    Cardiac Risk Stratification High             6 Minute Walk:  6 Minute Walk     Row Name 09/05/21 1311         6 Minute Walk   Phase Initial     Distance 1400 feet     Walk Time 6 minutes     # of Rest Breaks 0     MPH 2.65     METS 2.92     RPE 11     Perceived Dyspnea  11     VO2 Peak 10.24     Symptoms No     Resting HR 68 bpm     Resting BP 132/62     Resting Oxygen Saturation  95 %     Exercise Oxygen Saturation  during 6 min walk 94 %     Max Ex. HR 75 bpm     Max Ex. BP 160/60     2 Minute Post BP 130/60       Interval HR   1 Minute HR 74     2 Minute HR 75     3 Minute HR 74     4 Minute HR 74     5 Minute HR 74     6 Minute HR 75     2 Minute Post HR 65     Interval Heart Rate? Yes       Interval Oxygen   Interval Oxygen? Yes     Baseline Oxygen Saturation % 95 %     1 Minute Oxygen Saturation % 95 %     1 Minute Liters of Oxygen 0 L     2 Minute Oxygen Saturation % 95 %     2 Minute Liters of Oxygen 0 L     3 Minute Oxygen Saturation % 94 %     3 Minute Liters of Oxygen 0 L     4 Minute Oxygen Saturation % 95 %     4 Minute Liters of Oxygen 0 L     5 Minute Oxygen Saturation % 95 %     5 Minute Liters of Oxygen 0 L     6 Minute Oxygen Saturation % 95 %      6 Minute Liters of Oxygen 0 L     2 Minute Post Oxygen Saturation % 95 %     2 Minute Post Liters of Oxygen 0 L              Oxygen Initial Assessment:  Oxygen Initial Assessment - 09/05/21 1308       Home Oxygen   Home Oxygen Device None    Sleep Oxygen Prescription None    Home Exercise Oxygen Prescription None    Home Resting Oxygen Prescription None    Compliance with Home Oxygen Use Yes      Initial 6  min Walk   Oxygen Used None      Program Oxygen Prescription   Program Oxygen Prescription None      Intervention   Short Term Goals To learn and understand importance of maintaining oxygen saturations>88%;To learn and demonstrate proper use of respiratory medications;To learn and understand importance of monitoring SPO2 with pulse oximeter and demonstrate accurate use of the pulse oximeter.    Long  Term Goals Verbalizes importance of monitoring SPO2 with pulse oximeter and return demonstration;Maintenance of O2 saturations>88%;Exhibits proper breathing techniques, such as pursed lip breathing or other method taught during program session;Compliance with respiratory medication             Oxygen Re-Evaluation:  Oxygen Re-Evaluation     Row Name 09/18/21 1428 10/16/21 1359           Program Oxygen Prescription   Program Oxygen Prescription None None        Home Oxygen   Home Oxygen Device None None      Sleep Oxygen Prescription None None      Home Exercise Oxygen Prescription None None      Home Resting Oxygen Prescription None None      Compliance with Home Oxygen Use Yes Yes        Goals/Expected Outcomes   Short Term Goals To learn and understand importance of maintaining oxygen saturations>88%;To learn and demonstrate proper use of respiratory medications;To learn and understand importance of monitoring SPO2 with pulse oximeter and demonstrate accurate use of the pulse oximeter. To learn and understand importance of maintaining oxygen  saturations>88%;To learn and demonstrate proper use of respiratory medications;To learn and understand importance of monitoring SPO2 with pulse oximeter and demonstrate accurate use of the pulse oximeter.      Long  Term Goals -- Verbalizes importance of monitoring SPO2 with pulse oximeter and return demonstration;Maintenance of O2 saturations>88%;Exhibits proper breathing techniques, such as pursed lip breathing or other method taught during program session;Compliance with respiratory medication      Goals/Expected Outcomes compliance compliance               Oxygen Discharge (Final Oxygen Re-Evaluation):  Oxygen Re-Evaluation - 10/16/21 1359       Program Oxygen Prescription   Program Oxygen Prescription None      Home Oxygen   Home Oxygen Device None    Sleep Oxygen Prescription None    Home Exercise Oxygen Prescription None    Home Resting Oxygen Prescription None    Compliance with Home Oxygen Use Yes      Goals/Expected Outcomes   Short Term Goals To learn and understand importance of maintaining oxygen saturations>88%;To learn and demonstrate proper use of respiratory medications;To learn and understand importance of monitoring SPO2 with pulse oximeter and demonstrate accurate use of the pulse oximeter.    Long  Term Goals Verbalizes importance of monitoring SPO2 with pulse oximeter and return demonstration;Maintenance of O2 saturations>88%;Exhibits proper breathing techniques, such as pursed lip breathing or other method taught during program session;Compliance with respiratory medication    Goals/Expected Outcomes compliance             Initial Exercise Prescription:  Initial Exercise Prescription - 09/05/21 1300       Date of Initial Exercise RX and Referring Provider   Date 09/05/21    Referring Provider Dr. Loletta Specter    Expected Discharge Date 11/06/21      Treadmill   MPH 1.2    Grade 0    Minutes 17  Recumbant Elliptical   Level 1    RPM 60     Minutes 22      Prescription Details   Frequency (times per week) 2    Duration Progress to 30 minutes of continuous aerobic without signs/symptoms of physical distress      Intensity   THRR 40-80% of Max Heartrate 58-115    Ratings of Perceived Exertion 11-13    Perceived Dyspnea 0-4      Resistance Training   Training Prescription Yes    Weight 4    Reps 10-15             Perform Capillary Blood Glucose checks as needed.  Exercise Prescription Changes:   Exercise Prescription Changes     Row Name 09/18/21 1400 10/04/21 1400 10/11/21 1400 10/30/21 1400       Response to Exercise   Blood Pressure (Admit) 140/64 128/60 120/60 114/60    Blood Pressure (Exercise) 128/62 160/72 150/60 124/60    Blood Pressure (Exit) 120/62 130/60 120/60 122/64    Heart Rate (Admit) 68 bpm 82 bpm 78 bpm 82 bpm    Heart Rate (Exercise) 92 bpm 90 bpm 85 bpm 90 bpm    Heart Rate (Exit) 78 bpm 79 bpm 86 bpm 90 bpm    Oxygen Saturation (Admit) 93 % 94 % 96 % 96 %    Oxygen Saturation (Exercise) 93 % 93 % 94 % 95 %    Oxygen Saturation (Exit) 93 % 95 % 94 % 94 %    Rating of Perceived Exertion (Exercise) 11 12 12 12     Perceived Dyspnea (Exercise) 11 12 12 12     Duration Continue with 30 min of aerobic exercise without signs/symptoms of physical distress. Continue with 30 min of aerobic exercise without signs/symptoms of physical distress. Continue with 30 min of aerobic exercise without signs/symptoms of physical distress. Continue with 30 min of aerobic exercise without signs/symptoms of physical distress.    Intensity THRR unchanged THRR unchanged THRR unchanged THRR unchanged      Progression   Progression Continue to progress workloads to maintain intensity without signs/symptoms of physical distress. Continue to progress workloads to maintain intensity without signs/symptoms of physical distress. Continue to progress workloads to maintain intensity without signs/symptoms of physical  distress. Continue to progress workloads to maintain intensity without signs/symptoms of physical distress.      Resistance Training   Training Prescription Yes Yes Yes Yes    Weight 4 4 4 4     Reps 10-15 10-15 10-15 10-15    Time 10 Minutes 10 Minutes 10 Minutes 10 Minutes      Treadmill   MPH 1.8 2.5 2.5 2.4    Grade 0 0 0 0    Minutes 17 17 17 17     METs 2.38 2.91 2.91 2.87      Recumbant Elliptical   Level 1 2 1 1     RPM 53 57 54 44    Minutes 22 22 22 22     METs 3.3 3.8 3 2.3             Exercise Comments:   Exercise Goals and Review:   Exercise Goals     Row Name 09/05/21 1315 09/18/21 1425 10/16/21 1355         Exercise Goals   Increase Physical Activity Yes Yes Yes     Intervention Provide advice, education, support and counseling about physical activity/exercise needs.;Develop an individualized exercise prescription for aerobic and resistive  training based on initial evaluation findings, risk stratification, comorbidities and participant's personal goals. Provide advice, education, support and counseling about physical activity/exercise needs.;Develop an individualized exercise prescription for aerobic and resistive training based on initial evaluation findings, risk stratification, comorbidities and participant's personal goals. Provide advice, education, support and counseling about physical activity/exercise needs.;Develop an individualized exercise prescription for aerobic and resistive training based on initial evaluation findings, risk stratification, comorbidities and participant's personal goals.     Expected Outcomes Short Term: Attend rehab on a regular basis to increase amount of physical activity.;Long Term: Add in home exercise to make exercise part of routine and to increase amount of physical activity.;Long Term: Exercising regularly at least 3-5 days a week. Short Term: Attend rehab on a regular basis to increase amount of physical activity.;Long Term:  Add in home exercise to make exercise part of routine and to increase amount of physical activity.;Long Term: Exercising regularly at least 3-5 days a week. Short Term: Attend rehab on a regular basis to increase amount of physical activity.;Long Term: Add in home exercise to make exercise part of routine and to increase amount of physical activity.;Long Term: Exercising regularly at least 3-5 days a week.     Increase Strength and Stamina Yes Yes Yes     Intervention Provide advice, education, support and counseling about physical activity/exercise needs.;Develop an individualized exercise prescription for aerobic and resistive training based on initial evaluation findings, risk stratification, comorbidities and participant's personal goals. Provide advice, education, support and counseling about physical activity/exercise needs.;Develop an individualized exercise prescription for aerobic and resistive training based on initial evaluation findings, risk stratification, comorbidities and participant's personal goals. Provide advice, education, support and counseling about physical activity/exercise needs.;Develop an individualized exercise prescription for aerobic and resistive training based on initial evaluation findings, risk stratification, comorbidities and participant's personal goals.     Expected Outcomes Short Term: Increase workloads from initial exercise prescription for resistance, speed, and METs.;Short Term: Perform resistance training exercises routinely during rehab and add in resistance training at home;Long Term: Improve cardiorespiratory fitness, muscular endurance and strength as measured by increased METs and functional capacity (6MWT) Short Term: Increase workloads from initial exercise prescription for resistance, speed, and METs.;Short Term: Perform resistance training exercises routinely during rehab and add in resistance training at home;Long Term: Improve cardiorespiratory fitness,  muscular endurance and strength as measured by increased METs and functional capacity (6MWT) Short Term: Increase workloads from initial exercise prescription for resistance, speed, and METs.;Short Term: Perform resistance training exercises routinely during rehab and add in resistance training at home;Long Term: Improve cardiorespiratory fitness, muscular endurance and strength as measured by increased METs and functional capacity (6MWT)     Able to understand and use rate of perceived exertion (RPE) scale Yes Yes Yes     Intervention Provide education and explanation on how to use RPE scale Provide education and explanation on how to use RPE scale Provide education and explanation on how to use RPE scale     Expected Outcomes Short Term: Able to use RPE daily in rehab to express subjective intensity level;Long Term:  Able to use RPE to guide intensity level when exercising independently Short Term: Able to use RPE daily in rehab to express subjective intensity level;Long Term:  Able to use RPE to guide intensity level when exercising independently --     Able to understand and use Dyspnea scale Yes Yes Yes     Intervention Provide education and explanation on how to use Dyspnea scale Provide  education and explanation on how to use Dyspnea scale Provide education and explanation on how to use Dyspnea scale     Expected Outcomes Short Term: Able to use Dyspnea scale daily in rehab to express subjective sense of shortness of breath during exertion;Long Term: Able to use Dyspnea scale to guide intensity level when exercising independently Short Term: Able to use Dyspnea scale daily in rehab to express subjective sense of shortness of breath during exertion;Long Term: Able to use Dyspnea scale to guide intensity level when exercising independently Short Term: Able to use Dyspnea scale daily in rehab to express subjective sense of shortness of breath during exertion;Long Term: Able to use Dyspnea scale to guide  intensity level when exercising independently     Knowledge and understanding of Target Heart Rate Range (THRR) Yes Yes Yes     Intervention Provide education and explanation of THRR including how the numbers were predicted and where they are located for reference Provide education and explanation of THRR including how the numbers were predicted and where they are located for reference Provide education and explanation of THRR including how the numbers were predicted and where they are located for reference     Expected Outcomes Short Term: Able to state/look up THRR;Short Term: Able to use daily as guideline for intensity in rehab;Long Term: Able to use THRR to govern intensity when exercising independently Short Term: Able to state/look up THRR;Short Term: Able to use daily as guideline for intensity in rehab;Long Term: Able to use THRR to govern intensity when exercising independently Short Term: Able to state/look up THRR;Short Term: Able to use daily as guideline for intensity in rehab;Long Term: Able to use THRR to govern intensity when exercising independently     Understanding of Exercise Prescription Yes Yes Yes     Intervention Provide education, explanation, and written materials on patient's individual exercise prescription Provide education, explanation, and written materials on patient's individual exercise prescription Provide education, explanation, and written materials on patient's individual exercise prescription     Expected Outcomes Short Term: Able to explain program exercise prescription;Long Term: Able to explain home exercise prescription to exercise independently Short Term: Able to explain program exercise prescription;Long Term: Able to explain home exercise prescription to exercise independently Short Term: Able to explain program exercise prescription;Long Term: Able to explain home exercise prescription to exercise independently              Exercise Goals Re-Evaluation  :  Exercise Goals Re-Evaluation     Row Name 09/18/21 1425 10/16/21 1355           Exercise Goal Re-Evaluation   Exercise Goals Review Increase Physical Activity;Increase Strength and Stamina;Able to understand and use rate of perceived exertion (RPE) scale;Able to understand and use Dyspnea scale;Knowledge and understanding of Target Heart Rate Range (THRR);Understanding of Exercise Prescription Increase Strength and Stamina;Increase Physical Activity;Able to understand and use rate of perceived exertion (RPE) scale;Able to understand and use Dyspnea scale;Knowledge and understanding of Target Heart Rate Range (THRR);Understanding of Exercise Prescription      Comments Pt has completed 2 sessions of PR. He seems to enjoy coming to class so far and is eager to get going. He is increasing his speed on the treadmill and RPM on the ellp. He has a bike at home that he rides for 20 minutes a day along with doing weights and bands. He is currenlty exercising at 3.3 METs on the ellp. Will continue to monitor and progress as able.  Pt has completed 9 sessions of PR. 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 He is increasing his speed on the treadmill and RPM on the ellp. He has been tired recently due to [place] but pushes through. He is still exercising at home on days off from class and treatments. He is currently exercising at 3.0 METs on the ellp. Will continue to monitor and progress as able.      Expected Outcomes Through exercise at home and at rehab, the patient will meet their stated goals. Through exercise at home and at rehab, the patient will meet their stated goals.               Discharge Exercise Prescription (Final Exercise Prescription Changes):  Exercise Prescription Changes - 10/30/21 1400       Response to Exercise   Blood Pressure (Admit) 114/60    Blood Pressure (Exercise) 124/60    Blood Pressure (Exit) 122/64    Heart Rate (Admit) 82 bpm    Heart Rate (Exercise) 90 bpm     Heart Rate (Exit) 90 bpm    Oxygen Saturation (Admit) 96 %    Oxygen Saturation (Exercise) 95 %    Oxygen Saturation (Exit) 94 %    Rating of Perceived Exertion (Exercise) 12    Perceived Dyspnea (Exercise) 12    Duration Continue with 30 min of aerobic exercise without signs/symptoms of physical distress.    Intensity THRR unchanged      Progression   Progression Continue to progress workloads to maintain intensity without signs/symptoms of physical distress.      Resistance Training   Training Prescription Yes    Weight 4    Reps 10-15    Time 10 Minutes      Treadmill   MPH 2.4    Grade 0    Minutes 17    METs 2.87      Recumbant Elliptical   Level 1    RPM 44    Minutes 22    METs 2.3             Nutrition:  Target Goals: Understanding of nutrition guidelines, daily intake of sodium 1500mg , cholesterol 200mg , calories 30% from fat and 7% or less from saturated fats, daily to have 5 or more servings of fruits and vegetables.  Biometrics:  Pre Biometrics - 09/05/21 1316       Pre Biometrics   Height 6' (1.829 m)    Weight 82.9 kg    Waist Circumference 40 inches    Hip Circumference 39 inches    Waist to Hip Ratio 1.03 %    BMI (Calculated) 24.78    Triceps Skinfold 10 mm    % Body Fat 24.8 %    Grip Strength 37.8 kg    Flexibility 0 in    Single Leg Stand 2 seconds              Nutrition Therapy Plan and Nutrition Goals:  Nutrition Therapy & Goals - 09/19/21 1137       Personal Nutrition Goals   Comments Patient scored 15 on his diet assessment. We offer 2 educational sessions on heart heatlhy nutrition with handouts.      Intervention Plan   Intervention Nutrition handout(s) given to patient.    Expected Outcomes Short Term Goal: Understand basic principles of dietary content, such as calories, fat, sodium, cholesterol and nutrients.             Nutrition Assessments:  Nutrition  Assessments - 09/05/21 1314       MEDFICTS Scores    Pre Score 15            MEDIFICTS Score Key: ?70 Need to make dietary changes  40-70 Heart Healthy Diet ? 40 Therapeutic Level Cholesterol Diet   Picture Your Plate Scores: <12 Unhealthy dietary pattern with much room for improvement. 41-50 Dietary pattern unlikely to meet recommendations for good health and room for improvement. 51-60 More healthful dietary pattern, with some room for improvement.  >60 Healthy dietary pattern, although there may be some specific behaviors that could be improved.    Nutrition Goals Re-Evaluation:   Nutrition Goals Discharge (Final Nutrition Goals Re-Evaluation):   Psychosocial: Target Goals: Acknowledge presence or absence of significant depression and/or stress, maximize coping skills, provide positive support system. Participant is able to verbalize types and ability to use techniques and skills needed for reducing stress and depression.  Initial Review & Psychosocial Screening:  Initial Psych Review & Screening - 09/05/21 1311       Initial Review   Current issues with None Identified      Family Dynamics   Good Support System? Yes    Comments His support system is his wife.      Barriers   Psychosocial barriers to participate in program There are no identifiable barriers or psychosocial needs.      Screening Interventions   Interventions Encouraged to exercise    Expected Outcomes Long Term goal: The participant improves quality of Life and PHQ9 Scores as seen by post scores and/or verbalization of changes;Short Term goal: Identification and review with participant of any Quality of Life or Depression concerns found by scoring the questionnaire.             Quality of Life Scores:  Quality of Life - 09/05/21 1316       Quality of Life   Select Quality of Life      Quality of Life Scores   Health/Function Pre 27.56 %    Socioeconomic Pre 30 %    Psych/Spiritual Pre 30 %    Family Pre 30 %    GLOBAL Pre 28.85 %             Scores of 19 and below usually indicate a poorer quality of life in these areas.  A difference of  2-3 points is a clinically meaningful difference.  A difference of 2-3 points in the total score of the Quality of Life Index has been associated with significant improvement in overall quality of life, self-image, physical symptoms, and general health in studies assessing change in quality of life.   PHQ-9: Review Flowsheet  More data exists      09/24/2021 09/05/2021 08/29/2021 09/18/2020 08/29/2020  Depression screen PHQ 2/9  Decreased Interest 0 0 0 0 0  Down, Depressed, Hopeless 0 0 0 0 0  PHQ - 2 Score 0 0 0 0 0  Altered sleeping 0 0 0 - 0  Tired, decreased energy 0 0 0 - 0  Change in appetite 0 0 0 - 0  Feeling bad or failure about yourself  0 0 0 - 0  Trouble concentrating 0 0 0 - 0  Moving slowly or fidgety/restless 0 0 0 - 0  Suicidal thoughts 0 0 0 - 0  PHQ-9 Score 0 0 0 - 0  Difficult doing work/chores Not difficult at all - Not difficult at all - Not difficult at all   Interpretation of  Total Score  Total Score Depression Severity:  1-4 = Minimal depression, 5-9 = Mild depression, 10-14 = Moderate depression, 15-19 = Moderately severe depression, 20-27 = Severe depression   Psychosocial Evaluation and Intervention:  Psychosocial Evaluation - 09/05/21 1331       Psychosocial Evaluation & Interventions   Interventions Encouraged to exercise with the program and follow exercise prescription    Comments Pt has no barriers to participating in PR. He has no identifible psychosocial issues. He was recently diagnosed with lung cancer, stage one in the left lobe and stage three in the right lobe. He is participating in rehab at the advice of his surgeon, and he is set to start chemotherapy on 9/5. He will be taking chemotherapy until the end of October, and then he will meet with his surgeon again to set a date for his lobectomy. He scored a 0 on his PHQ-9,a nd he reports  that he is in good spirits despite his recent diagnosis and upcoming surgery. He did request prayers, and did mention that his daughter passed away at the age of 79 a couple of years ago from cancer. He recently stopped smoking, as he had been smoking a pack of cigarettes per month for the past 20 years. Before that he had smoked a pack per day for about 40 years. He reports that his wife is his support system. His goals for the program are to get stronger and to prepare his body for surgery. He is ready to start the program.    Expected Outcomes Pt will continue to have no identifiable psychosocial issues.    Continue Psychosocial Services  No Follow up required             Psychosocial Re-Evaluation:  Psychosocial Re-Evaluation     [place] Name 09/19/21 1133 10/08/21 1507           Psychosocial Re-Evaluation   Current issues with None Identified None Identified      Comments Patient is new to the program completing 3 sessions. He continues to have no psychosocial barriers or issues identified. We will continue to monitor. Patient has completed 7 sessions.  He continues to have no psychosocial barriers or issues identified.  He seems to enjoy coming to class and very interactiver with class and staff.  He demonstrates an interest in improving his health.   We will continue to monitor as he progresses in the program.      Expected Outcomes Patient will continue to have no psychosocial barriers or issues identified. Patient will continue to have no psychosocial barriers or issues identified.      Interventions Encouraged to attend Pulmonary Rehabilitation for the exercise;Relaxation education;Encouraged to attend Cardiac Rehabilitation for the exercise Encouraged to attend Pulmonary Rehabilitation for the exercise;Relaxation education;Encouraged to attend Cardiac Rehabilitation for the exercise      Continue Psychosocial Services  No Follow up required No Follow up required                Psychosocial Discharge (Final Psychosocial Re-Evaluation):  Psychosocial Re-Evaluation - 10/08/21 1507       Psychosocial Re-Evaluation   Current issues with None Identified    Comments Patient has completed 7 sessions.  He continues to have no psychosocial barriers or issues identified.  He seems to enjoy coming to class and very interactiver with class and staff.  He demonstrates an interest in improving his health.   We will continue to monitor as he progresses in the program.  Expected Outcomes Patient will continue to have no psychosocial barriers or issues identified.    Interventions Encouraged to attend Pulmonary Rehabilitation for the exercise;Relaxation education;Encouraged to attend Cardiac Rehabilitation for the exercise    Continue Psychosocial Services  No Follow up required              Education: Education Goals: Education classes will be provided on a weekly basis, covering required topics. Participant will state understanding/return demonstration of topics presented.  Learning Barriers/Preferences:  Learning Barriers/Preferences - 09/05/21 1300       Learning Barriers/Preferences   Learning Barriers None    Learning Preferences Audio             Education Topics: How Lungs Work and Diseases: - Discuss the anatomy of the lungs and diseases that can affect the lungs, such as COPD. Flowsheet Row PULMONARY REHAB OTHER RESPIRATORY from 11/08/2021 in [place]  Date 10/11/21  Educator DF  Instruction Review Code 2- Demonstrated Understanding       Exercise: -Discuss the importance of exercise, FITT principles of exercise, normal and abnormal responses to exercise, and how to exercise safely.   Environmental Irritants: -Discuss types of environmental irritants and how to limit exposure to environmental irritants. Flowsheet Row PULMONARY REHAB OTHER RESPIRATORY from 11/08/2021 in [place]  Date  10/18/21  Instruction Review Code 1- Verbalizes Understanding       Meds/Inhalers and oxygen: - Discuss respiratory medications, definition of an inhaler and oxygen, and the proper way to use an inhaler and oxygen. Flowsheet Row PULMONARY REHAB OTHER RESPIRATORY from 11/08/2021 in [place]  Date 10/25/21  Educator HJ       Energy Saving Techniques: - Discuss methods to conserve energy and decrease shortness of breath when performing activities of daily living.  Flowsheet Row PULMONARY REHAB OTHER RESPIRATORY from 11/08/2021 in [place]  Date 11/01/21  Educator HB  Instruction Review Code 1- Verbalizes Understanding       Bronchial Hygiene / Breathing Techniques: - Discuss breathing mechanics, pursed-lip breathing technique,  proper posture, effective ways to clear airways, and other functional breathing techniques Flowsheet Row PULMONARY REHAB OTHER RESPIRATORY from 11/08/2021 in [place]  Date 11/08/21  Educator HB  Instruction Review Code 1- Research scientist (medical): - Provides group verbal and written instruction about the health risks of elevated stress, cause of high stress, and healthy ways to reduce stress.   Nutrition I: Fats: - Discuss the types of cholesterol, what cholesterol does to the body, and how cholesterol levels can be controlled.   Nutrition II: Labels: -Discuss the different components of food labels and how to read food labels.   Respiratory Infections: - Discuss the signs and symptoms of respiratory infections, ways to prevent respiratory infections, and the importance of seeking medical treatment when having a respiratory infection. Flowsheet Row PULMONARY REHAB OTHER RESPIRATORY from 11/08/2021 in [place]  Date 09/13/21  Educator DM  Instruction Review Code 1- Verbalizes Understanding       Stress I: Signs and  Symptoms: - Discuss the causes of stress, how stress may lead to anxiety and depression, and ways to limit stress. Flowsheet Row PULMONARY REHAB OTHER RESPIRATORY from 11/08/2021 in [place]  Date 09/20/21  Educator [place]  Instruction Review Code 1- Verbalizes Understanding       Stress II: Relaxation: -Discuss relaxation techniques to limit stress. Flowsheet Row  PULMONARY REHAB OTHER RESPIRATORY from 11/08/2021 in [place]  Date 09/27/21  Educator pb  Instruction Review Code 1- Verbalizes Understanding       Oxygen for Home/Travel: - Discuss how to prepare for travel when on oxygen and proper ways to transport and store oxygen to ensure safety. Flowsheet Row PULMONARY REHAB OTHER RESPIRATORY from 11/08/2021 in [place]  Date 10/04/21  Educator DF  Instruction Review Code 2- Demonstrated Understanding       Knowledge Questionnaire Score:  Knowledge Questionnaire Score - 09/05/21 1248       Knowledge Questionnaire Score   Pre Score 15/18             Core Components/Risk Factors/Patient Goals at Admission:  Personal Goals and Risk Factors at Admission - 09/05/21 1317       Core Components/Risk Factors/Patient Goals on Admission   Personal Goal Other Yes    Personal Goal Get stronger in preparation for upcoming lung surgery.    Intervention Attend pulmonary rehab twice per week and continue to exercise at home.    Expected Outcomes Pt will meet stated goals.             Core Components/Risk Factors/Patient Goals Review:   Goals and Risk Factor Review     Row Name 09/19/21 1134 10/08/21 1514           Core Components/Risk Factors/Patient Goals Review   Personal Goals Review Other Other      Review Patient was referred to PR with malignant neoplasm of Left upper lobe of lung. He has completed 3 sessions and is doing well in the program with consistent attendance. His personal goals for  the program are to get stronger and prepare his lungs for surgery for cancer. He is currently receiving chemotherapy. We will continue to monitor his progress as he works towards meeting these personal goals. Patient has completed 7 sessions and is doing well in the program with consistent attendance. His personal goals for the program are to get stronger and prepare his lungs for surgery for cancer. He is currently receiving chemotherapy.  He is tolerating exercise well on the Tm and recumbent elliptical and his O2 sats averaging around 93%-96% while exercising on room air.  We will continue to encourage his progress as he continues to works towards meeting these personal goals.      Expected Outcomes Patient will complete the program meeting both personal and program goals. Patient will complete the program meeting both personal and program goals.               Core Components/Risk Factors/Patient Goals at Discharge (Final Review):   Goals and Risk Factor Review - 10/08/21 1514       Core Components/Risk Factors/Patient Goals Review   Personal Goals Review Other    Review Patient has completed 7 sessions and is doing well in the program with consistent attendance. His personal goals for the program are to get stronger and prepare his lungs for surgery for cancer. He is currently receiving chemotherapy.  He is tolerating exercise well on the Tm and recumbent elliptical and his O2 sats averaging around 93%-96% while exercising on room air.  We will continue to encourage his progress as he continues to works towards meeting these personal goals.    Expected Outcomes Patient will complete the program meeting both personal and program goals.             ITP Comments:  Comments: ITP REVIEW Pt is making expected progress toward pulmonary rehab goals after completing 16 sessions. Recommend continued exercise, life style modification, education, and utilization of breathing techniques to  increase stamina and strength and decrease shortness of breath with exertion.

## 2021-11-14 NOTE — Progress Notes (Signed)
Pulmonary Individual Treatment Plan  Patient Details  Name: Patrick Harris MRN: [id number] Date of Birth: May 05, 1945 Referring Provider:   [facility] from 09/05/2021 in [place]  Referring Provider Dr. Loletta Specter       Initial Encounter Date:  Flowsheet Row PULMONARY REHAB OTHER RESP ORIENTATION from 09/05/2021 in [place]  Date 09/05/21       Visit Diagnosis: Malignant neoplasm of upper lobe of left lung ([place])  Patient's Home Medications on Admission:   Current Outpatient Medications:    acetaminophen (TYLENOL) 650 MG CR tablet, Take 1,300 mg by mouth every 8 (eight) hours as needed for pain., Disp: , Rfl:    aspirin EC 81 MG tablet, Take 1 tablet (81 mg total) by mouth at bedtime. Okay to restart this medication on 07/03/2021, Disp: 30 tablet, Rfl: 11   Cholecalciferol (VITAMIN D3) 2000 UNITS TABS, Take 2,000 Units by mouth in the morning and at bedtime., Disp: , Rfl:    Coenzyme Q10 (COQ-10) 100 MG CAPS, Take 100 mg by mouth in the morning., Disp: , Rfl:    Cyanocobalamin (B-12 PO), Take 1 tablet by mouth daily., Disp: , Rfl:    Garlic 9390 MG CAPS, Take 1,000 mg by mouth in the morning., Disp: , Rfl:    hydrALAZINE (APRESOLINE) 25 MG tablet, Take 1 tablet (25 mg total) by mouth 3 (three) times daily., Disp: 270 tablet, Rfl: 3   irbesartan (AVAPRO) 300 MG tablet, Take 1 tablet (300 mg total) by mouth daily., Disp: 90 tablet, Rfl: 3   Menaquinone-7 (VITAMIN K2) 100 MCG CAPS, Take 100 mcg by mouth in the morning., Disp: , Rfl:    nitroGLYCERIN (NITROSTAT) 0.4 MG SL tablet, Place 1 tablet (0.4 mg total) under the tongue every 5 (five) minutes x 3 doses as needed for chest pain (if no relief after 2nd dose, proceed to ED or call 911)., Disp: 25 tablet, Rfl: 3   Omega-3 Fatty Acids (OMEGA-3 FISH OIL PO), Take 1,000 mg by mouth in the morning., Disp: , Rfl:    omeprazole (PRILOSEC) 40 MG capsule,  Take 1 capsule (40 mg total) by mouth in the morning and at bedtime. (Patient taking differently: Take 40 mg by mouth daily.), Disp: 60 capsule, Rfl: 11   ondansetron (ZOFRAN) 8 MG tablet, Take 8 mg by mouth every 8 (eight) hours as needed., Disp: , Rfl:    Polyethyl Glycol-Propyl Glycol (LUBRICANT EYE DROPS) 0.4-0.3 % SOLN, Place 1-2 drops into both eyes 3 (three) times daily as needed (dry/irritated eyes.)., Disp: , Rfl:    pyrithione zinc (SELSUN BLUE DRY SCALP) 1 % shampoo, Apply 1 application  topically daily., Disp: , Rfl:    rosuvastatin (CRESTOR) 20 MG tablet, TAKE HALF A TABLET (10 MG) BY MOUTH ONCE DAILY., Disp: 45 tablet, Rfl: 3   tamsulosin (FLOMAX) 0.4 MG CAPS capsule, Take 0.4 mg by mouth in the morning., Disp: , Rfl:    Testosterone 20.25 MG/ACT (1.62%) GEL, Apply 2 Pump topically in the morning., Disp: , Rfl:    TURMERIC PO, Take 1,000 mg by mouth in the morning., Disp: , Rfl:    vitamin C (ASCORBIC ACID) 500 MG tablet, Take 500 mg by mouth in the morning., Disp: , Rfl:   Past Medical History: Past Medical History:  Diagnosis Date   Arthritis    CAD (coronary artery disease)    CABG with LIMA to LAD and RIMA to RCA August 2000 - Dr.  Hendrickson   Cancer [facility])    basal cell   CHF (congestive heart failure) (HCC)    COPD (chronic obstructive pulmonary disease) (HCC)    Elevated glycated hemoglobin    Essential hypertension    GERD (gastroesophageal reflux disease)    Heart murmur    Hyperlipidemia    Hypopituitarism ([place])    Lung cancer ([place])    Osteopenia    Dr Wilson Singer   Pancreatitis    Peyronie disease    Testosterone deficiency    Dr Wilson Singer    Tobacco Use: Social History   Tobacco Use  Smoking Status Some Days   Types: Cigarettes   Last attempt to quit: 01/07/1998   Years since quitting: 23.8  Smokeless Tobacco Never  Tobacco Comments   Smokes "every once in a while"   07/25/21 [facility]     Labs: Review Flowsheet  More data exists      Latest Ref Rng & Units  08/22/2017 12/23/2017 08/24/2018 08/25/2019 08/29/2020  Labs for ITP Cardiac and Pulmonary Rehab  Cholestrol 0 - 200 mg/dL 128  137  146  122  118   LDL (calc) 0 - 99 mg/dL 71  81  86  71  65   HDL-C >39.00 mg/dL 29.10  36  34.20  33  33.60   Trlycerides 0.0 - 149.0 mg/dL 140.0  101  130.0  98  96.0   Hemoglobin A1c 4.6 - 6.5 % 6.2  5.9  6.1  5.5  6.0     Capillary Blood Glucose: Lab Results  Component Value Date   GLUCAP 117 (H) 07/23/2021     Pulmonary Assessment Scores:  Pulmonary Assessment Scores     Row Name 09/05/21 1249         ADL UCSD   ADL Phase Entry     SOB Score total 16     Rest 0     Walk 2     Stairs 3     Bath 0     Dress 0     Shop 0       CAT Score   CAT Score 16       mMRC Score   mMRC Score 0             UCSD: Self-administered rating of dyspnea associated with activities of daily living (ADLs) 6-point scale (0 = "not at all" to 5 = "maximal or unable to do because of breathlessness")  Scoring Scores range from 0 to 120.  Minimally important difference is 5 units  CAT: CAT can identify the health impairment of COPD patients and is better correlated with disease progression.  CAT has a scoring range of zero to 40. The CAT score is classified into four groups of low (less than 10), medium (10 - 20), high (21-30) and very high (31-40) based on the impact level of disease on health status. A CAT score over 10 suggests significant symptoms.  A worsening CAT score could be explained by an exacerbation, poor medication adherence, poor inhaler technique, or progression of COPD or comorbid conditions.  CAT MCID is 2 points  mMRC: mMRC (Modified Medical Research Council) Dyspnea Scale is used to assess the degree of baseline functional disability in patients of respiratory disease due to dyspnea. No minimal important difference is established. A decrease in score of 1 point or greater is considered a positive change.   Pulmonary Function  Assessment:   Exercise Target Goals: Exercise Program Goal: Individual exercise prescription  set using results from initial 6 min walk test and THRR while considering  patient's activity barriers and safety.   Exercise Prescription Goal: Initial exercise prescription builds to 30-45 minutes a day of aerobic activity, 2-3 days per week.  Home exercise guidelines will be given to patient during program as part of exercise prescription that the participant will acknowledge.  Activity Barriers & Risk Stratification:  Activity Barriers & Cardiac Risk Stratification - 09/05/21 1310       Activity Barriers & Cardiac Risk Stratification   Activity Barriers Neck/Spine Problems;Joint Problems    Cardiac Risk Stratification High             6 Minute Walk:  6 Minute Walk     Row Name 09/05/21 1311         6 Minute Walk   Phase Initial     Distance 1400 feet     Walk Time 6 minutes     # of Rest Breaks 0     MPH 2.65     METS 2.92     RPE 11     Perceived Dyspnea  11     VO2 Peak 10.24     Symptoms No     Resting HR 68 bpm     Resting BP 132/62     Resting Oxygen Saturation  95 %     Exercise Oxygen Saturation  during 6 min walk 94 %     Max Ex. HR 75 bpm     Max Ex. BP 160/60     2 Minute Post BP 130/60       Interval HR   1 Minute HR 74     2 Minute HR 75     3 Minute HR 74     4 Minute HR 74     5 Minute HR 74     6 Minute HR 75     2 Minute Post HR 65     Interval Heart Rate? Yes       Interval Oxygen   Interval Oxygen? Yes     Baseline Oxygen Saturation % 95 %     1 Minute Oxygen Saturation % 95 %     1 Minute Liters of Oxygen 0 L     2 Minute Oxygen Saturation % 95 %     2 Minute Liters of Oxygen 0 L     3 Minute Oxygen Saturation % 94 %     3 Minute Liters of Oxygen 0 L     4 Minute Oxygen Saturation % 95 %     4 Minute Liters of Oxygen 0 L     5 Minute Oxygen Saturation % 95 %     5 Minute Liters of Oxygen 0 L     6 Minute Oxygen Saturation % 95 %      6 Minute Liters of Oxygen 0 L     2 Minute Post Oxygen Saturation % 95 %     2 Minute Post Liters of Oxygen 0 L              Oxygen Initial Assessment:  Oxygen Initial Assessment - 09/05/21 1308       Home Oxygen   Home Oxygen Device None    Sleep Oxygen Prescription None    Home Exercise Oxygen Prescription None    Home Resting Oxygen Prescription None    Compliance with Home Oxygen Use Yes      Initial 6  min Walk   Oxygen Used None      Program Oxygen Prescription   Program Oxygen Prescription None      Intervention   Short Term Goals To learn and understand importance of maintaining oxygen saturations>88%;To learn and demonstrate proper use of respiratory medications;To learn and understand importance of monitoring SPO2 with pulse oximeter and demonstrate accurate use of the pulse oximeter.    Long  Term Goals Verbalizes importance of monitoring SPO2 with pulse oximeter and return demonstration;Maintenance of O2 saturations>88%;Exhibits proper breathing techniques, such as pursed lip breathing or other method taught during program session;Compliance with respiratory medication             Oxygen Re-Evaluation:  Oxygen Re-Evaluation     Row Name 09/18/21 1428 10/16/21 1359           Program Oxygen Prescription   Program Oxygen Prescription None None        Home Oxygen   Home Oxygen Device None None      Sleep Oxygen Prescription None None      Home Exercise Oxygen Prescription None None      Home Resting Oxygen Prescription None None      Compliance with Home Oxygen Use Yes Yes        Goals/Expected Outcomes   Short Term Goals To learn and understand importance of maintaining oxygen saturations>88%;To learn and demonstrate proper use of respiratory medications;To learn and understand importance of monitoring SPO2 with pulse oximeter and demonstrate accurate use of the pulse oximeter. To learn and understand importance of maintaining oxygen  saturations>88%;To learn and demonstrate proper use of respiratory medications;To learn and understand importance of monitoring SPO2 with pulse oximeter and demonstrate accurate use of the pulse oximeter.      Long  Term Goals -- Verbalizes importance of monitoring SPO2 with pulse oximeter and return demonstration;Maintenance of O2 saturations>88%;Exhibits proper breathing techniques, such as pursed lip breathing or other method taught during program session;Compliance with respiratory medication      Goals/Expected Outcomes compliance compliance               Oxygen Discharge (Final Oxygen Re-Evaluation):  Oxygen Re-Evaluation - 10/16/21 1359       Program Oxygen Prescription   Program Oxygen Prescription None      Home Oxygen   Home Oxygen Device None    Sleep Oxygen Prescription None    Home Exercise Oxygen Prescription None    Home Resting Oxygen Prescription None    Compliance with Home Oxygen Use Yes      Goals/Expected Outcomes   Short Term Goals To learn and understand importance of maintaining oxygen saturations>88%;To learn and demonstrate proper use of respiratory medications;To learn and understand importance of monitoring SPO2 with pulse oximeter and demonstrate accurate use of the pulse oximeter.    Long  Term Goals Verbalizes importance of monitoring SPO2 with pulse oximeter and return demonstration;Maintenance of O2 saturations>88%;Exhibits proper breathing techniques, such as pursed lip breathing or other method taught during program session;Compliance with respiratory medication    Goals/Expected Outcomes compliance             Initial Exercise Prescription:  Initial Exercise Prescription - 09/05/21 1300       Date of Initial Exercise RX and Referring Provider   Date 09/05/21    Referring Provider Dr. Loletta Specter    Expected Discharge Date 11/06/21      Treadmill   MPH 1.2    Grade 0    Minutes 17  Recumbant Elliptical   Level 1    RPM 60     Minutes 22      Prescription Details   Frequency (times per week) 2    Duration Progress to 30 minutes of continuous aerobic without signs/symptoms of physical distress      Intensity   THRR 40-80% of Max Heartrate 58-115    Ratings of Perceived Exertion 11-13    Perceived Dyspnea 0-4      Resistance Training   Training Prescription Yes    Weight 4    Reps 10-15             Perform Capillary Blood Glucose checks as needed.  Exercise Prescription Changes:   Exercise Prescription Changes     Row Name 09/18/21 1400 10/04/21 1400 10/11/21 1400 10/30/21 1400       Response to Exercise   Blood Pressure (Admit) 140/64 128/60 120/60 114/60    Blood Pressure (Exercise) 128/62 160/72 150/60 124/60    Blood Pressure (Exit) 120/62 130/60 120/60 122/64    Heart Rate (Admit) 68 bpm 82 bpm 78 bpm 82 bpm    Heart Rate (Exercise) 92 bpm 90 bpm 85 bpm 90 bpm    Heart Rate (Exit) 78 bpm 79 bpm 86 bpm 90 bpm    Oxygen Saturation (Admit) 93 % 94 % 96 % 96 %    Oxygen Saturation (Exercise) 93 % 93 % 94 % 95 %    Oxygen Saturation (Exit) 93 % 95 % 94 % 94 %    Rating of Perceived Exertion (Exercise) 11 12 12 12     Perceived Dyspnea (Exercise) 11 12 12 12     Duration Continue with 30 min of aerobic exercise without signs/symptoms of physical distress. Continue with 30 min of aerobic exercise without signs/symptoms of physical distress. Continue with 30 min of aerobic exercise without signs/symptoms of physical distress. Continue with 30 min of aerobic exercise without signs/symptoms of physical distress.    Intensity THRR unchanged THRR unchanged THRR unchanged THRR unchanged      Progression   Progression Continue to progress workloads to maintain intensity without signs/symptoms of physical distress. Continue to progress workloads to maintain intensity without signs/symptoms of physical distress. Continue to progress workloads to maintain intensity without signs/symptoms of physical  distress. Continue to progress workloads to maintain intensity without signs/symptoms of physical distress.      Resistance Training   Training Prescription Yes Yes Yes Yes    Weight 4 4 4 4     Reps 10-15 10-15 10-15 10-15    Time 10 Minutes 10 Minutes 10 Minutes 10 Minutes      Treadmill   MPH 1.8 2.5 2.5 2.4    Grade 0 0 0 0    Minutes 17 17 17 17     METs 2.38 2.91 2.91 2.87      Recumbant Elliptical   Level 1 2 1 1     RPM 53 57 54 44    Minutes 22 22 22 22     METs 3.3 3.8 3 2.3             Exercise Comments:   Exercise Goals and Review:   Exercise Goals     Row Name 09/05/21 1315 09/18/21 1425 10/16/21 1355         Exercise Goals   Increase Physical Activity Yes Yes Yes     Intervention Provide advice, education, support and counseling about physical activity/exercise needs.;Develop an individualized exercise prescription for aerobic and resistive  training based on initial evaluation findings, risk stratification, comorbidities and participant's personal goals. Provide advice, education, support and counseling about physical activity/exercise needs.;Develop an individualized exercise prescription for aerobic and resistive training based on initial evaluation findings, risk stratification, comorbidities and participant's personal goals. Provide advice, education, support and counseling about physical activity/exercise needs.;Develop an individualized exercise prescription for aerobic and resistive training based on initial evaluation findings, risk stratification, comorbidities and participant's personal goals.     Expected Outcomes Short Term: Attend rehab on a regular basis to increase amount of physical activity.;Long Term: Add in home exercise to make exercise part of routine and to increase amount of physical activity.;Long Term: Exercising regularly at least 3-5 days a week. Short Term: Attend rehab on a regular basis to increase amount of physical activity.;Long Term:  Add in home exercise to make exercise part of routine and to increase amount of physical activity.;Long Term: Exercising regularly at least 3-5 days a week. Short Term: Attend rehab on a regular basis to increase amount of physical activity.;Long Term: Add in home exercise to make exercise part of routine and to increase amount of physical activity.;Long Term: Exercising regularly at least 3-5 days a week.     Increase Strength and Stamina Yes Yes Yes     Intervention Provide advice, education, support and counseling about physical activity/exercise needs.;Develop an individualized exercise prescription for aerobic and resistive training based on initial evaluation findings, risk stratification, comorbidities and participant's personal goals. Provide advice, education, support and counseling about physical activity/exercise needs.;Develop an individualized exercise prescription for aerobic and resistive training based on initial evaluation findings, risk stratification, comorbidities and participant's personal goals. Provide advice, education, support and counseling about physical activity/exercise needs.;Develop an individualized exercise prescription for aerobic and resistive training based on initial evaluation findings, risk stratification, comorbidities and participant's personal goals.     Expected Outcomes Short Term: Increase workloads from initial exercise prescription for resistance, speed, and METs.;Short Term: Perform resistance training exercises routinely during rehab and add in resistance training at home;Long Term: Improve cardiorespiratory fitness, muscular endurance and strength as measured by increased METs and functional capacity (6MWT) Short Term: Increase workloads from initial exercise prescription for resistance, speed, and METs.;Short Term: Perform resistance training exercises routinely during rehab and add in resistance training at home;Long Term: Improve cardiorespiratory fitness,  muscular endurance and strength as measured by increased METs and functional capacity (6MWT) Short Term: Increase workloads from initial exercise prescription for resistance, speed, and METs.;Short Term: Perform resistance training exercises routinely during rehab and add in resistance training at home;Long Term: Improve cardiorespiratory fitness, muscular endurance and strength as measured by increased METs and functional capacity (6MWT)     Able to understand and use rate of perceived exertion (RPE) scale Yes Yes Yes     Intervention Provide education and explanation on how to use RPE scale Provide education and explanation on how to use RPE scale Provide education and explanation on how to use RPE scale     Expected Outcomes Short Term: Able to use RPE daily in rehab to express subjective intensity level;Long Term:  Able to use RPE to guide intensity level when exercising independently Short Term: Able to use RPE daily in rehab to express subjective intensity level;Long Term:  Able to use RPE to guide intensity level when exercising independently --     Able to understand and use Dyspnea scale Yes Yes Yes     Intervention Provide education and explanation on how to use Dyspnea scale Provide  education and explanation on how to use Dyspnea scale Provide education and explanation on how to use Dyspnea scale     Expected Outcomes Short Term: Able to use Dyspnea scale daily in rehab to express subjective sense of shortness of breath during exertion;Long Term: Able to use Dyspnea scale to guide intensity level when exercising independently Short Term: Able to use Dyspnea scale daily in rehab to express subjective sense of shortness of breath during exertion;Long Term: Able to use Dyspnea scale to guide intensity level when exercising independently Short Term: Able to use Dyspnea scale daily in rehab to express subjective sense of shortness of breath during exertion;Long Term: Able to use Dyspnea scale to guide  intensity level when exercising independently     Knowledge and understanding of Target Heart Rate Range (THRR) Yes Yes Yes     Intervention Provide education and explanation of THRR including how the numbers were predicted and where they are located for reference Provide education and explanation of THRR including how the numbers were predicted and where they are located for reference Provide education and explanation of THRR including how the numbers were predicted and where they are located for reference     Expected Outcomes Short Term: Able to state/look up THRR;Short Term: Able to use daily as guideline for intensity in rehab;Long Term: Able to use THRR to govern intensity when exercising independently Short Term: Able to state/look up THRR;Short Term: Able to use daily as guideline for intensity in rehab;Long Term: Able to use THRR to govern intensity when exercising independently Short Term: Able to state/look up THRR;Short Term: Able to use daily as guideline for intensity in rehab;Long Term: Able to use THRR to govern intensity when exercising independently     Understanding of Exercise Prescription Yes Yes Yes     Intervention Provide education, explanation, and written materials on patient's individual exercise prescription Provide education, explanation, and written materials on patient's individual exercise prescription Provide education, explanation, and written materials on patient's individual exercise prescription     Expected Outcomes Short Term: Able to explain program exercise prescription;Long Term: Able to explain home exercise prescription to exercise independently Short Term: Able to explain program exercise prescription;Long Term: Able to explain home exercise prescription to exercise independently Short Term: Able to explain program exercise prescription;Long Term: Able to explain home exercise prescription to exercise independently              Exercise Goals Re-Evaluation  :  Exercise Goals Re-Evaluation     Row Name 09/18/21 1425 10/16/21 1355           Exercise Goal Re-Evaluation   Exercise Goals Review Increase Physical Activity;Increase Strength and Stamina;Able to understand and use rate of perceived exertion (RPE) scale;Able to understand and use Dyspnea scale;Knowledge and understanding of Target Heart Rate Range (THRR);Understanding of Exercise Prescription Increase Strength and Stamina;Increase Physical Activity;Able to understand and use rate of perceived exertion (RPE) scale;Able to understand and use Dyspnea scale;Knowledge and understanding of Target Heart Rate Range (THRR);Understanding of Exercise Prescription      Comments Pt has completed 2 sessions of PR. He seems to enjoy coming to class so far and is eager to get going. He is increasing his speed on the treadmill and RPM on the ellp. He has a bike at home that he rides for 20 minutes a day along with doing weights and bands. He is currenlty exercising at 3.3 METs on the ellp. Will continue to monitor and progress as able.  Pt has completed 9 sessions of PR. 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 He is increasing his speed on the treadmill and RPM on the ellp. He has been tired recently due to [place] but pushes through. He is still exercising at home on days off from class and treatments. He is currently exercising at 3.0 METs on the ellp. Will continue to monitor and progress as able.      Expected Outcomes Through exercise at home and at rehab, the patient will meet their stated goals. Through exercise at home and at rehab, the patient will meet their stated goals.               Discharge Exercise Prescription (Final Exercise Prescription Changes):  Exercise Prescription Changes - 10/30/21 1400       Response to Exercise   Blood Pressure (Admit) 114/60    Blood Pressure (Exercise) 124/60    Blood Pressure (Exit) 122/64    Heart Rate (Admit) 82 bpm    Heart Rate (Exercise) 90 bpm     Heart Rate (Exit) 90 bpm    Oxygen Saturation (Admit) 96 %    Oxygen Saturation (Exercise) 95 %    Oxygen Saturation (Exit) 94 %    Rating of Perceived Exertion (Exercise) 12    Perceived Dyspnea (Exercise) 12    Duration Continue with 30 min of aerobic exercise without signs/symptoms of physical distress.    Intensity THRR unchanged      Progression   Progression Continue to progress workloads to maintain intensity without signs/symptoms of physical distress.      Resistance Training   Training Prescription Yes    Weight 4    Reps 10-15    Time 10 Minutes      Treadmill   MPH 2.4    Grade 0    Minutes 17    METs 2.87      Recumbant Elliptical   Level 1    RPM 44    Minutes 22    METs 2.3             Nutrition:  Target Goals: Understanding of nutrition guidelines, daily intake of sodium 1500mg , cholesterol 200mg , calories 30% from fat and 7% or less from saturated fats, daily to have 5 or more servings of fruits and vegetables.  Biometrics:  Pre Biometrics - 09/05/21 1316       Pre Biometrics   Height 6' (1.829 m)    Weight 82.9 kg    Waist Circumference 40 inches    Hip Circumference 39 inches    Waist to Hip Ratio 1.03 %    BMI (Calculated) 24.78    Triceps Skinfold 10 mm    % Body Fat 24.8 %    Grip Strength 37.8 kg    Flexibility 0 in    Single Leg Stand 2 seconds              Nutrition Therapy Plan and Nutrition Goals:  Nutrition Therapy & Goals - 09/19/21 1137       Personal Nutrition Goals   Comments Patient scored 15 on his diet assessment. We offer 2 educational sessions on heart heatlhy nutrition with handouts.      Intervention Plan   Intervention Nutrition handout(s) given to patient.    Expected Outcomes Short Term Goal: Understand basic principles of dietary content, such as calories, fat, sodium, cholesterol and nutrients.             Nutrition Assessments:  Nutrition  Assessments - 09/05/21 1314       MEDFICTS Scores    Pre Score 15            MEDIFICTS Score Key: ?70 Need to make dietary changes  40-70 Heart Healthy Diet ? 40 Therapeutic Level Cholesterol Diet   Picture Your Plate Scores: <56 Unhealthy dietary pattern with much room for improvement. 41-50 Dietary pattern unlikely to meet recommendations for good health and room for improvement. 51-60 More healthful dietary pattern, with some room for improvement.  >60 Healthy dietary pattern, although there may be some specific behaviors that could be improved.    Nutrition Goals Re-Evaluation:   Nutrition Goals Discharge (Final Nutrition Goals Re-Evaluation):   Psychosocial: Target Goals: Acknowledge presence or absence of significant depression and/or stress, maximize coping skills, provide positive support system. Participant is able to verbalize types and ability to use techniques and skills needed for reducing stress and depression.  Initial Review & Psychosocial Screening:  Initial Psych Review & Screening - 09/05/21 1311       Initial Review   Current issues with None Identified      Family Dynamics   Good Support System? Yes    Comments His support system is his wife.      Barriers   Psychosocial barriers to participate in program There are no identifiable barriers or psychosocial needs.      Screening Interventions   Interventions Encouraged to exercise    Expected Outcomes Long Term goal: The participant improves quality of Life and PHQ9 Scores as seen by post scores and/or verbalization of changes;Short Term goal: Identification and review with participant of any Quality of Life or Depression concerns found by scoring the questionnaire.             Quality of Life Scores:  Quality of Life - 09/05/21 1316       Quality of Life   Select Quality of Life      Quality of Life Scores   Health/Function Pre 27.56 %    Socioeconomic Pre 30 %    Psych/Spiritual Pre 30 %    Family Pre 30 %    GLOBAL Pre 28.85 %             Scores of 19 and below usually indicate a poorer quality of life in these areas.  A difference of  2-3 points is a clinically meaningful difference.  A difference of 2-3 points in the total score of the Quality of Life Index has been associated with significant improvement in overall quality of life, self-image, physical symptoms, and general health in studies assessing change in quality of life.   PHQ-9: Review Flowsheet  More data exists      09/24/2021 09/05/2021 08/29/2021 09/18/2020 08/29/2020  Depression screen PHQ 2/9  Decreased Interest 0 0 0 0 0  Down, Depressed, Hopeless 0 0 0 0 0  PHQ - 2 Score 0 0 0 0 0  Altered sleeping 0 0 0 - 0  Tired, decreased energy 0 0 0 - 0  Change in appetite 0 0 0 - 0  Feeling bad or failure about yourself  0 0 0 - 0  Trouble concentrating 0 0 0 - 0  Moving slowly or fidgety/restless 0 0 0 - 0  Suicidal thoughts 0 0 0 - 0  PHQ-9 Score 0 0 0 - 0  Difficult doing work/chores Not difficult at all - Not difficult at all - Not difficult at all   Interpretation of  Total Score  Total Score Depression Severity:  1-4 = Minimal depression, 5-9 = Mild depression, 10-14 = Moderate depression, 15-19 = Moderately severe depression, 20-27 = Severe depression   Psychosocial Evaluation and Intervention:  Psychosocial Evaluation - 09/05/21 1331       Psychosocial Evaluation & Interventions   Interventions Encouraged to exercise with the program and follow exercise prescription    Comments Pt has no barriers to participating in PR. He has no identifible psychosocial issues. He was recently diagnosed with lung cancer, stage one in the left lobe and stage three in the right lobe. He is participating in rehab at the advice of his surgeon, and he is set to start chemotherapy on 9/5. He will be taking chemotherapy until the end of October, and then he will meet with his surgeon again to set a date for his lobectomy. He scored a 0 on his PHQ-9,a nd he reports  that he is in good spirits despite his recent diagnosis and upcoming surgery. He did request prayers, and did mention that his daughter passed away at the age of 73 a couple of years ago from cancer. He recently stopped smoking, as he had been smoking a pack of cigarettes per month for the past 20 years. Before that he had smoked a pack per day for about 40 years. He reports that his wife is his support system. His goals for the program are to get stronger and to prepare his body for surgery. He is ready to start the program.    Expected Outcomes Pt will continue to have no identifiable psychosocial issues.    Continue Psychosocial Services  No Follow up required             Psychosocial Re-Evaluation:  Psychosocial Re-Evaluation     [place] Name 09/19/21 1133 10/08/21 1507           Psychosocial Re-Evaluation   Current issues with None Identified None Identified      Comments Patient is new to the program completing 3 sessions. He continues to have no psychosocial barriers or issues identified. We will continue to monitor. Patient has completed 7 sessions.  He continues to have no psychosocial barriers or issues identified.  He seems to enjoy coming to class and very interactiver with class and staff.  He demonstrates an interest in improving his health.   We will continue to monitor as he progresses in the program.      Expected Outcomes Patient will continue to have no psychosocial barriers or issues identified. Patient will continue to have no psychosocial barriers or issues identified.      Interventions Encouraged to attend Pulmonary Rehabilitation for the exercise;Relaxation education;Encouraged to attend Cardiac Rehabilitation for the exercise Encouraged to attend Pulmonary Rehabilitation for the exercise;Relaxation education;Encouraged to attend Cardiac Rehabilitation for the exercise      Continue Psychosocial Services  No Follow up required No Follow up required                Psychosocial Discharge (Final Psychosocial Re-Evaluation):  Psychosocial Re-Evaluation - 10/08/21 1507       Psychosocial Re-Evaluation   Current issues with None Identified    Comments Patient has completed 7 sessions.  He continues to have no psychosocial barriers or issues identified.  He seems to enjoy coming to class and very interactiver with class and staff.  He demonstrates an interest in improving his health.   We will continue to monitor as he progresses in the program.  Expected Outcomes Patient will continue to have no psychosocial barriers or issues identified.    Interventions Encouraged to attend Pulmonary Rehabilitation for the exercise;Relaxation education;Encouraged to attend Cardiac Rehabilitation for the exercise    Continue Psychosocial Services  No Follow up required              Education: Education Goals: Education classes will be provided on a weekly basis, covering required topics. Participant will state understanding/return demonstration of topics presented.  Learning Barriers/Preferences:  Learning Barriers/Preferences - 09/05/21 1300       Learning Barriers/Preferences   Learning Barriers None    Learning Preferences Audio             Education Topics: How Lungs Work and Diseases: - Discuss the anatomy of the lungs and diseases that can affect the lungs, such as COPD. Flowsheet Row PULMONARY REHAB OTHER RESPIRATORY from 11/08/2021 in [place]  Date 10/11/21  Educator DF  Instruction Review Code 2- Demonstrated Understanding       Exercise: -Discuss the importance of exercise, FITT principles of exercise, normal and abnormal responses to exercise, and how to exercise safely.   Environmental Irritants: -Discuss types of environmental irritants and how to limit exposure to environmental irritants. Flowsheet Row PULMONARY REHAB OTHER RESPIRATORY from 11/08/2021 in [place]  Date  10/18/21  Instruction Review Code 1- Verbalizes Understanding       Meds/Inhalers and oxygen: - Discuss respiratory medications, definition of an inhaler and oxygen, and the proper way to use an inhaler and oxygen. Flowsheet Row PULMONARY REHAB OTHER RESPIRATORY from 11/08/2021 in [place]  Date 10/25/21  Educator HJ       Energy Saving Techniques: - Discuss methods to conserve energy and decrease shortness of breath when performing activities of daily living.  Flowsheet Row PULMONARY REHAB OTHER RESPIRATORY from 11/08/2021 in [place]  Date 11/01/21  Educator HB  Instruction Review Code 1- Verbalizes Understanding       Bronchial Hygiene / Breathing Techniques: - Discuss breathing mechanics, pursed-lip breathing technique,  proper posture, effective ways to clear airways, and other functional breathing techniques Flowsheet Row PULMONARY REHAB OTHER RESPIRATORY from 11/08/2021 in [place]  Date 11/08/21  Educator HB  Instruction Review Code 1- Research scientist (medical): - Provides group verbal and written instruction about the health risks of elevated stress, cause of high stress, and healthy ways to reduce stress.   Nutrition I: Fats: - Discuss the types of cholesterol, what cholesterol does to the body, and how cholesterol levels can be controlled.   Nutrition II: Labels: -Discuss the different components of food labels and how to read food labels.   Respiratory Infections: - Discuss the signs and symptoms of respiratory infections, ways to prevent respiratory infections, and the importance of seeking medical treatment when having a respiratory infection. Flowsheet Row PULMONARY REHAB OTHER RESPIRATORY from 11/08/2021 in [place]  Date 09/13/21  Educator DM  Instruction Review Code 1- Verbalizes Understanding       Stress I: Signs and  Symptoms: - Discuss the causes of stress, how stress may lead to anxiety and depression, and ways to limit stress. Flowsheet Row PULMONARY REHAB OTHER RESPIRATORY from 11/08/2021 in [place]  Date 09/20/21  Educator Wilder  Instruction Review Code 1- Verbalizes Understanding       Stress II: Relaxation: -Discuss relaxation techniques to limit stress. Flowsheet Row  PULMONARY REHAB OTHER RESPIRATORY from 11/08/2021 in [place]  Date 09/27/21  Educator pb  Instruction Review Code 1- Verbalizes Understanding       Oxygen for Home/Travel: - Discuss how to prepare for travel when on oxygen and proper ways to transport and store oxygen to ensure safety. Flowsheet Row PULMONARY REHAB OTHER RESPIRATORY from 11/08/2021 in [place]  Date 10/04/21  Educator DF  Instruction Review Code 2- Demonstrated Understanding       Knowledge Questionnaire Score:  Knowledge Questionnaire Score - 09/05/21 1248       Knowledge Questionnaire Score   Pre Score 15/18             Core Components/Risk Factors/Patient Goals at Admission:  Personal Goals and Risk Factors at Admission - 09/05/21 1317       Core Components/Risk Factors/Patient Goals on Admission   Personal Goal Other Yes    Personal Goal Get stronger in preparation for upcoming lung surgery.    Intervention Attend pulmonary rehab twice per week and continue to exercise at home.    Expected Outcomes Pt will meet stated goals.             Core Components/Risk Factors/Patient Goals Review:   Goals and Risk Factor Review     Row Name 09/19/21 1134 10/08/21 1514           Core Components/Risk Factors/Patient Goals Review   Personal Goals Review Other Other      Review Patient was referred to PR with malignant neoplasm of Left upper lobe of lung. He has completed 3 sessions and is doing well in the program with consistent attendance. His personal goals for  the program are to get stronger and prepare his lungs for surgery for cancer. He is currently receiving chemotherapy. We will continue to monitor his progress as he works towards meeting these personal goals. Patient has completed 7 sessions and is doing well in the program with consistent attendance. His personal goals for the program are to get stronger and prepare his lungs for surgery for cancer. He is currently receiving chemotherapy.  He is tolerating exercise well on the Tm and recumbent elliptical and his O2 sats averaging around 93%-96% while exercising on room air.  We will continue to encourage his progress as he continues to works towards meeting these personal goals.      Expected Outcomes Patient will complete the program meeting both personal and program goals. Patient will complete the program meeting both personal and program goals.               Core Components/Risk Factors/Patient Goals at Discharge (Final Review):   Goals and Risk Factor Review - 10/08/21 1514       Core Components/Risk Factors/Patient Goals Review   Personal Goals Review Other    Review Patient has completed 7 sessions and is doing well in the program with consistent attendance. His personal goals for the program are to get stronger and prepare his lungs for surgery for cancer. He is currently receiving chemotherapy.  He is tolerating exercise well on the Tm and recumbent elliptical and his O2 sats averaging around 93%-96% while exercising on room air.  We will continue to encourage his progress as he continues to works towards meeting these personal goals.    Expected Outcomes Patient will complete the program meeting both personal and program goals.             ITP Comments:  Comments: ITP REVIEW Pt is making expected progress toward pulmonary rehab goals after completing 16 sessions. Recommend continued exercise, life style modification, education, and utilization of breathing techniques to  increase stamina and strength and decrease shortness of breath with exertion.

## 2021-11-15 DIAGNOSIS — C3411 Malignant neoplasm of upper lobe, right bronchus or lung: Secondary | ICD-10-CM | POA: Diagnosis not present

## 2021-11-15 DIAGNOSIS — G8918 Other acute postprocedural pain: Secondary | ICD-10-CM | POA: Diagnosis not present

## 2021-11-15 DIAGNOSIS — J939 Pneumothorax, unspecified: Secondary | ICD-10-CM | POA: Diagnosis not present

## 2021-11-16 DIAGNOSIS — C3411 Malignant neoplasm of upper lobe, right bronchus or lung: Secondary | ICD-10-CM | POA: Diagnosis not present

## 2021-11-16 DIAGNOSIS — J948 Other specified pleural conditions: Secondary | ICD-10-CM | POA: Diagnosis not present

## 2021-11-16 DIAGNOSIS — Z4682 Encounter for fitting and adjustment of non-vascular catheter: Secondary | ICD-10-CM | POA: Diagnosis not present

## 2021-11-16 DIAGNOSIS — Z01818 Encounter for other preprocedural examination: Secondary | ICD-10-CM | POA: Diagnosis not present

## 2021-11-17 DIAGNOSIS — Z4682 Encounter for fitting and adjustment of non-vascular catheter: Secondary | ICD-10-CM | POA: Diagnosis not present

## 2021-11-17 DIAGNOSIS — Z01818 Encounter for other preprocedural examination: Secondary | ICD-10-CM | POA: Diagnosis not present

## 2021-11-17 DIAGNOSIS — J939 Pneumothorax, unspecified: Secondary | ICD-10-CM | POA: Diagnosis not present

## 2021-11-17 DIAGNOSIS — G8918 Other acute postprocedural pain: Secondary | ICD-10-CM | POA: Diagnosis not present

## 2021-11-17 DIAGNOSIS — C3411 Malignant neoplasm of upper lobe, right bronchus or lung: Secondary | ICD-10-CM | POA: Diagnosis not present

## 2021-11-18 DIAGNOSIS — Z01818 Encounter for other preprocedural examination: Secondary | ICD-10-CM | POA: Diagnosis not present

## 2021-11-18 DIAGNOSIS — C3411 Malignant neoplasm of upper lobe, right bronchus or lung: Secondary | ICD-10-CM | POA: Diagnosis not present

## 2021-11-18 DIAGNOSIS — T797XXA Traumatic subcutaneous emphysema, initial encounter: Secondary | ICD-10-CM | POA: Diagnosis not present

## 2021-11-19 DIAGNOSIS — G8918 Other acute postprocedural pain: Secondary | ICD-10-CM | POA: Diagnosis not present

## 2021-11-19 DIAGNOSIS — C3411 Malignant neoplasm of upper lobe, right bronchus or lung: Secondary | ICD-10-CM | POA: Diagnosis not present

## 2021-11-19 DIAGNOSIS — Z4682 Encounter for fitting and adjustment of non-vascular catheter: Secondary | ICD-10-CM | POA: Diagnosis not present

## 2021-11-19 DIAGNOSIS — J9383 Other pneumothorax: Secondary | ICD-10-CM | POA: Diagnosis not present

## 2021-11-19 NOTE — Progress Notes (Signed)
Discharge Progress Report  Patient Details  Name: Patrick Harris MRN: [id number] Date of Birth: 01/25/1945 Referring Provider:   Flowsheet Row PULMONARY REHAB OTHER RESP ORIENTATION from 09/05/2021 in [place]  Referring Provider Dr. Loletta Specter        Number of Visits: 16  Reason for Discharge:  Early Exit:  lobectomy of LUL  Smoking History:  Social History   Tobacco Use  Smoking Status Some Days   Types: Cigarettes   Last attempt to quit: 01/07/1998   Years since quitting: 23.8  Smokeless Tobacco Never  Tobacco Comments   Smokes "every once in a while"   07/25/21 [facility]     Diagnosis:  Malignant neoplasm of upper lobe of left lung ([place])  ADL UCSD:  Pulmonary Assessment Scores     Row Name 09/05/21 1249         ADL UCSD   ADL Phase Entry     SOB Score total 16     Rest 0     Walk 2     Stairs 3     Bath 0     Dress 0     Shop 0       CAT Score   CAT Score 16       mMRC Score   mMRC Score 0              Initial Exercise Prescription:  Initial Exercise Prescription - 09/05/21 1300       Date of Initial Exercise RX and Referring Provider   Date 09/05/21    Referring Provider Dr. Loletta Specter    Expected Discharge Date 11/06/21      Treadmill   MPH 1.2    Grade 0    Minutes 17      Recumbant Elliptical   Level 1    RPM 60    Minutes 22      Prescription Details   Frequency (times per week) 2    Duration Progress to 30 minutes of continuous aerobic without signs/symptoms of physical distress      Intensity   THRR 40-80% of Max Heartrate 58-115    Ratings of Perceived Exertion 11-13    Perceived Dyspnea 0-4      Resistance Training   Training Prescription Yes    Weight 4    Reps 10-15             Discharge Exercise Prescription (Final Exercise Prescription Changes):  Exercise Prescription Changes - 10/30/21 1400       Response to Exercise   Blood Pressure (Admit) 114/60    Blood Pressure (Exercise) 124/60     Blood Pressure (Exit) 122/64    Heart Rate (Admit) 82 bpm    Heart Rate (Exercise) 90 bpm    Heart Rate (Exit) 90 bpm    Oxygen Saturation (Admit) 96 %    Oxygen Saturation (Exercise) 95 %    Oxygen Saturation (Exit) 94 %    Rating of Perceived Exertion (Exercise) 12    Perceived Dyspnea (Exercise) 12    Duration Continue with 30 min of aerobic exercise without signs/symptoms of physical distress.    Intensity THRR unchanged      Progression   Progression Continue to progress workloads to maintain intensity without signs/symptoms of physical distress.      Resistance Training   Training Prescription Yes    Weight 4    Reps 10-15    Time 10 Minutes  Treadmill   MPH 2.4    Grade 0    Minutes 17    METs 2.87      Recumbant Elliptical   Level 1    RPM 44    Minutes 22    METs 2.3             Functional Capacity:  6 Minute Walk     Row Name 09/05/21 1311         6 Minute Walk   Phase Initial     Distance 1400 feet     Walk Time 6 minutes     # of Rest Breaks 0     MPH 2.65     METS 2.92     RPE 11     Perceived Dyspnea  11     VO2 Peak 10.24     Symptoms No     Resting HR 68 bpm     Resting BP 132/62     Resting Oxygen Saturation  95 %     Exercise Oxygen Saturation  during 6 min walk 94 %     Max Ex. HR 75 bpm     Max Ex. BP 160/60     2 Minute Post BP 130/60       Interval HR   1 Minute HR 74     2 Minute HR 75     3 Minute HR 74     4 Minute HR 74     5 Minute HR 74     6 Minute HR 75     2 Minute Post HR 65     Interval Heart Rate? Yes       Interval Oxygen   Interval Oxygen? Yes     Baseline Oxygen Saturation % 95 %     1 Minute Oxygen Saturation % 95 %     1 Minute Liters of Oxygen 0 L     2 Minute Oxygen Saturation % 95 %     2 Minute Liters of Oxygen 0 L     3 Minute Oxygen Saturation % 94 %     3 Minute Liters of Oxygen 0 L     4 Minute Oxygen Saturation % 95 %     4 Minute Liters of Oxygen 0 L     5 Minute Oxygen  Saturation % 95 %     5 Minute Liters of Oxygen 0 L     6 Minute Oxygen Saturation % 95 %     6 Minute Liters of Oxygen 0 L     2 Minute Post Oxygen Saturation % 95 %     2 Minute Post Liters of Oxygen 0 L              Psychological, QOL, Others - Outcomes: PHQ 2/9:    09/24/2021    8:21 AM 09/05/2021    1:09 PM 08/29/2021    8:28 AM 09/18/2020    9:54 AM 08/29/2020    4:57 PM  Depression screen PHQ 2/9  Decreased Interest 0 0 0 0 0  Down, Depressed, Hopeless 0 0 0 0 0  PHQ - 2 Score 0 0 0 0 0  Altered sleeping 0 0 0  0  Tired, decreased energy 0 0 0  0  Change in appetite 0 0 0  0  Feeling bad or failure about yourself  0 0 0  0  Trouble concentrating 0 0 0  0  Moving  slowly or fidgety/restless 0 0 0  0  Suicidal thoughts 0 0 0  0  PHQ-9 Score 0 0 0  0  Difficult doing work/chores Not difficult at all  Not difficult at all  Not difficult at all    Quality of Life:  Quality of Life - 09/05/21 1316       Quality of Life   Select Quality of Life      Quality of Life Scores   Health/Function Pre 27.56 %    Socioeconomic Pre 30 %    Psych/Spiritual Pre 30 %    Family Pre 30 %    GLOBAL Pre 28.85 %             Personal Goals: Goals established at orientation with interventions provided to work toward goal.  Personal Goals and Risk Factors at Admission - 09/05/21 1317       Core Components/Risk Factors/Patient Goals on Admission   Personal Goal Other Yes    Personal Goal Get stronger in preparation for upcoming lung surgery.    Intervention Attend pulmonary rehab twice per week and continue to exercise at home.    Expected Outcomes Pt will meet stated goals.              Personal Goals Discharge:  Goals and Risk Factor Review     Row Name 09/19/21 1134 10/08/21 1514           Core Components/Risk Factors/Patient Goals Review   Personal Goals Review Other Other      Review Patient was referred to PR with malignant neoplasm of Left upper lobe of  lung. He has completed 3 sessions and is doing well in the program with consistent attendance. His personal goals for the program are to get stronger and prepare his lungs for surgery for cancer. He is currently receiving chemotherapy. We will continue to monitor his progress as he works towards meeting these personal goals. Patient has completed 7 sessions and is doing well in the program with consistent attendance. His personal goals for the program are to get stronger and prepare his lungs for surgery for cancer. He is currently receiving chemotherapy.  He is tolerating exercise well on the Tm and recumbent elliptical and his O2 sats averaging around 93%-96% while exercising on room air.  We will continue to encourage his progress as he continues to works towards meeting these personal goals.      Expected Outcomes Patient will complete the program meeting both personal and program goals. Patient will complete the program meeting both personal and program goals.               Exercise Goals and Review:  Exercise Goals     Row Name 09/05/21 1315 09/18/21 1425 10/16/21 1355         Exercise Goals   Increase Physical Activity Yes Yes Yes     Intervention Provide advice, education, support and counseling about physical activity/exercise needs.;Develop an individualized exercise prescription for aerobic and resistive training based on initial evaluation findings, risk stratification, comorbidities and participant's personal goals. Provide advice, education, support and counseling about physical activity/exercise needs.;Develop an individualized exercise prescription for aerobic and resistive training based on initial evaluation findings, risk stratification, comorbidities and participant's personal goals. Provide advice, education, support and counseling about physical activity/exercise needs.;Develop an individualized exercise prescription for aerobic and resistive training based on initial  evaluation findings, risk stratification, comorbidities and participant's personal goals.     Expected Outcomes Short  Term: Attend rehab on a regular basis to increase amount of physical activity.;Long Term: Add in home exercise to make exercise part of routine and to increase amount of physical activity.;Long Term: Exercising regularly at least 3-5 days a week. Short Term: Attend rehab on a regular basis to increase amount of physical activity.;Long Term: Add in home exercise to make exercise part of routine and to increase amount of physical activity.;Long Term: Exercising regularly at least 3-5 days a week. Short Term: Attend rehab on a regular basis to increase amount of physical activity.;Long Term: Add in home exercise to make exercise part of routine and to increase amount of physical activity.;Long Term: Exercising regularly at least 3-5 days a week.     Increase Strength and Stamina Yes Yes Yes     Intervention Provide advice, education, support and counseling about physical activity/exercise needs.;Develop an individualized exercise prescription for aerobic and resistive training based on initial evaluation findings, risk stratification, comorbidities and participant's personal goals. Provide advice, education, support and counseling about physical activity/exercise needs.;Develop an individualized exercise prescription for aerobic and resistive training based on initial evaluation findings, risk stratification, comorbidities and participant's personal goals. Provide advice, education, support and counseling about physical activity/exercise needs.;Develop an individualized exercise prescription for aerobic and resistive training based on initial evaluation findings, risk stratification, comorbidities and participant's personal goals.     Expected Outcomes Short Term: Increase workloads from initial exercise prescription for resistance, speed, and METs.;Short Term: Perform resistance training exercises  routinely during rehab and add in resistance training at home;Long Term: Improve cardiorespiratory fitness, muscular endurance and strength as measured by increased METs and functional capacity (6MWT) Short Term: Increase workloads from initial exercise prescription for resistance, speed, and METs.;Short Term: Perform resistance training exercises routinely during rehab and add in resistance training at home;Long Term: Improve cardiorespiratory fitness, muscular endurance and strength as measured by increased METs and functional capacity (6MWT) Short Term: Increase workloads from initial exercise prescription for resistance, speed, and METs.;Short Term: Perform resistance training exercises routinely during rehab and add in resistance training at home;Long Term: Improve cardiorespiratory fitness, muscular endurance and strength as measured by increased METs and functional capacity (6MWT)     Able to understand and use rate of perceived exertion (RPE) scale Yes Yes Yes     Intervention Provide education and explanation on how to use RPE scale Provide education and explanation on how to use RPE scale Provide education and explanation on how to use RPE scale     Expected Outcomes Short Term: Able to use RPE daily in rehab to express subjective intensity level;Long Term:  Able to use RPE to guide intensity level when exercising independently Short Term: Able to use RPE daily in rehab to express subjective intensity level;Long Term:  Able to use RPE to guide intensity level when exercising independently --     Able to understand and use Dyspnea scale Yes Yes Yes     Intervention Provide education and explanation on how to use Dyspnea scale Provide education and explanation on how to use Dyspnea scale Provide education and explanation on how to use Dyspnea scale     Expected Outcomes Short Term: Able to use Dyspnea scale daily in rehab to express subjective sense of shortness of breath during exertion;Long Term:  Able to use Dyspnea scale to guide intensity level when exercising independently Short Term: Able to use Dyspnea scale daily in rehab to express subjective sense of shortness of breath during exertion;Long Term: Able to use  Dyspnea scale to guide intensity level when exercising independently Short Term: Able to use Dyspnea scale daily in rehab to express subjective sense of shortness of breath during exertion;Long Term: Able to use Dyspnea scale to guide intensity level when exercising independently     Knowledge and understanding of Target Heart Rate Range (THRR) Yes Yes Yes     Intervention Provide education and explanation of THRR including how the numbers were predicted and where they are located for reference Provide education and explanation of THRR including how the numbers were predicted and where they are located for reference Provide education and explanation of THRR including how the numbers were predicted and where they are located for reference     Expected Outcomes Short Term: Able to state/look up THRR;Short Term: Able to use daily as guideline for intensity in rehab;Long Term: Able to use THRR to govern intensity when exercising independently Short Term: Able to state/look up THRR;Short Term: Able to use daily as guideline for intensity in rehab;Long Term: Able to use THRR to govern intensity when exercising independently Short Term: Able to state/look up THRR;Short Term: Able to use daily as guideline for intensity in rehab;Long Term: Able to use THRR to govern intensity when exercising independently     Understanding of Exercise Prescription Yes Yes Yes     Intervention Provide education, explanation, and written materials on patient's individual exercise prescription Provide education, explanation, and written materials on patient's individual exercise prescription Provide education, explanation, and written materials on patient's individual exercise prescription     Expected Outcomes Short  Term: Able to explain program exercise prescription;Long Term: Able to explain home exercise prescription to exercise independently Short Term: Able to explain program exercise prescription;Long Term: Able to explain home exercise prescription to exercise independently Short Term: Able to explain program exercise prescription;Long Term: Able to explain home exercise prescription to exercise independently              Exercise Goals Re-Evaluation:  Exercise Goals Re-Evaluation     Row Name 09/18/21 1425 10/16/21 1355           Exercise Goal Re-Evaluation   Exercise Goals Review Increase Physical Activity;Increase Strength and Stamina;Able to understand and use rate of perceived exertion (RPE) scale;Able to understand and use Dyspnea scale;Knowledge and understanding of Target Heart Rate Range (THRR);Understanding of Exercise Prescription Increase Strength and Stamina;Increase Physical Activity;Able to understand and use rate of perceived exertion (RPE) scale;Able to understand and use Dyspnea scale;Knowledge and understanding of Target Heart Rate Range (THRR);Understanding of Exercise Prescription      Comments Pt has completed 2 sessions of PR. He seems to enjoy coming to class so far and is eager to get going. He is increasing his speed on the treadmill and RPM on the ellp. He has a bike at home that he rides for 20 minutes a day along with doing weights and bands. He is currenlty exercising at 3.3 METs on the ellp. Will continue to monitor and progress as able. Pt has completed 9 sessions of PR. 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 He is increasing his speed on the treadmill and RPM on the ellp. He has been tired recently due to [place] but pushes through. He is still exercising at home on days off from class and treatments. He is currently exercising at 3.0 METs on the ellp. Will continue to monitor and progress as able.      Expected Outcomes Through exercise at home and at rehab, the  patient will meet their stated goals. Through exercise at home and at rehab, the patient will meet their stated goals.               Nutrition & Weight - Outcomes:  Pre Biometrics - 09/05/21 1316       Pre Biometrics   Height 6' (1.829 m)    Weight 182 lb 12.2 oz (82.9 kg)    Waist Circumference 40 inches    Hip Circumference 39 inches    Waist to Hip Ratio 1.03 %    BMI (Calculated) 24.78    Triceps Skinfold 10 mm    % Body Fat 24.8 %    Grip Strength 37.8 kg    Flexibility 0 in    Single Leg Stand 2 seconds              Nutrition:  Nutrition Therapy & Goals - 09/19/21 1137       Personal Nutrition Goals   Comments Patient scored 15 on his diet assessment. We offer 2 educational sessions on heart heatlhy nutrition with handouts.      Intervention Plan   Intervention Nutrition handout(s) given to patient.    Expected Outcomes Short Term Goal: Understand basic principles of dietary content, such as calories, fat, sodium, cholesterol and nutrients.             Nutrition Discharge:  Nutrition Assessments - 09/05/21 1314       MEDFICTS Scores   Pre Score 15             Education Questionnaire Score:  Knowledge Questionnaire Score - 09/05/21 1248       Knowledge Questionnaire Score   Pre Score 15/18             Goals reviewed with patient; copy given to patient. Pt discharged from PR on 11/2/023 after 16 sessions. He was participating in [place] to prepare his body for his lobectomy to remove cancer from his lung. He underwent lobectomy surgery on 11/13/2021 at [facility]. I explained to pt that if he would like to do PR again after he has recovered from his surgery, that he will need to get another referral from his doctor.

## 2021-11-20 DIAGNOSIS — J939 Pneumothorax, unspecified: Secondary | ICD-10-CM | POA: Diagnosis not present

## 2021-11-20 DIAGNOSIS — C3411 Malignant neoplasm of upper lobe, right bronchus or lung: Secondary | ICD-10-CM | POA: Diagnosis not present

## 2021-11-20 DIAGNOSIS — R0682 Tachypnea, not elsewhere classified: Secondary | ICD-10-CM | POA: Diagnosis not present

## 2021-11-20 DIAGNOSIS — I2699 Other pulmonary embolism without acute cor pulmonale: Secondary | ICD-10-CM | POA: Diagnosis not present

## 2021-11-20 DIAGNOSIS — T797XXA Traumatic subcutaneous emphysema, initial encounter: Secondary | ICD-10-CM | POA: Diagnosis not present

## 2021-11-20 DIAGNOSIS — J982 Interstitial emphysema: Secondary | ICD-10-CM | POA: Diagnosis not present

## 2021-11-20 DIAGNOSIS — R0902 Hypoxemia: Secondary | ICD-10-CM | POA: Diagnosis not present

## 2021-11-20 DIAGNOSIS — Z01818 Encounter for other preprocedural examination: Secondary | ICD-10-CM | POA: Diagnosis not present

## 2021-11-20 DIAGNOSIS — T8182XA Emphysema (subcutaneous) resulting from a procedure, initial encounter: Secondary | ICD-10-CM | POA: Diagnosis not present

## 2021-11-20 DIAGNOSIS — G8918 Other acute postprocedural pain: Secondary | ICD-10-CM | POA: Diagnosis not present

## 2021-11-21 DIAGNOSIS — J982 Interstitial emphysema: Secondary | ICD-10-CM | POA: Diagnosis not present

## 2021-11-21 DIAGNOSIS — J939 Pneumothorax, unspecified: Secondary | ICD-10-CM | POA: Diagnosis not present

## 2021-11-21 DIAGNOSIS — Z4682 Encounter for fitting and adjustment of non-vascular catheter: Secondary | ICD-10-CM | POA: Diagnosis not present

## 2021-11-21 DIAGNOSIS — C3411 Malignant neoplasm of upper lobe, right bronchus or lung: Secondary | ICD-10-CM | POA: Diagnosis not present

## 2021-11-21 DIAGNOSIS — H43811 Vitreous degeneration, right eye: Secondary | ICD-10-CM | POA: Diagnosis not present

## 2021-11-22 DIAGNOSIS — Z951 Presence of aortocoronary bypass graft: Secondary | ICD-10-CM | POA: Diagnosis not present

## 2021-11-22 DIAGNOSIS — J939 Pneumothorax, unspecified: Secondary | ICD-10-CM | POA: Diagnosis not present

## 2021-11-22 DIAGNOSIS — C3411 Malignant neoplasm of upper lobe, right bronchus or lung: Secondary | ICD-10-CM | POA: Diagnosis not present

## 2021-11-22 DIAGNOSIS — Z902 Acquired absence of lung [part of]: Secondary | ICD-10-CM | POA: Diagnosis not present

## 2021-11-22 DIAGNOSIS — Z8679 Personal history of other diseases of the circulatory system: Secondary | ICD-10-CM | POA: Diagnosis not present

## 2021-11-22 DIAGNOSIS — G4733 Obstructive sleep apnea (adult) (pediatric): Secondary | ICD-10-CM | POA: Diagnosis not present

## 2021-11-22 DIAGNOSIS — I2510 Atherosclerotic heart disease of native coronary artery without angina pectoris: Secondary | ICD-10-CM | POA: Diagnosis not present

## 2021-11-22 DIAGNOSIS — J982 Interstitial emphysema: Secondary | ICD-10-CM | POA: Diagnosis not present

## 2021-11-22 DIAGNOSIS — E871 Hypo-osmolality and hyponatremia: Secondary | ICD-10-CM | POA: Diagnosis not present

## 2021-11-22 DIAGNOSIS — I10 Essential (primary) hypertension: Secondary | ICD-10-CM | POA: Diagnosis not present

## 2021-11-22 DIAGNOSIS — Z9889 Other specified postprocedural states: Secondary | ICD-10-CM | POA: Diagnosis not present

## 2021-11-23 DIAGNOSIS — C3411 Malignant neoplasm of upper lobe, right bronchus or lung: Secondary | ICD-10-CM | POA: Diagnosis not present

## 2021-11-23 DIAGNOSIS — J939 Pneumothorax, unspecified: Secondary | ICD-10-CM | POA: Diagnosis not present

## 2021-11-23 DIAGNOSIS — Z01818 Encounter for other preprocedural examination: Secondary | ICD-10-CM | POA: Diagnosis not present

## 2021-11-23 DIAGNOSIS — J982 Interstitial emphysema: Secondary | ICD-10-CM | POA: Diagnosis not present

## 2021-11-24 DIAGNOSIS — C3411 Malignant neoplasm of upper lobe, right bronchus or lung: Secondary | ICD-10-CM | POA: Diagnosis not present

## 2021-11-24 DIAGNOSIS — Z01818 Encounter for other preprocedural examination: Secondary | ICD-10-CM | POA: Diagnosis not present

## 2021-11-24 DIAGNOSIS — C3490 Malignant neoplasm of unspecified part of unspecified bronchus or lung: Secondary | ICD-10-CM | POA: Diagnosis not present

## 2021-11-24 DIAGNOSIS — J948 Other specified pleural conditions: Secondary | ICD-10-CM | POA: Diagnosis not present

## 2021-11-24 DIAGNOSIS — Z452 Encounter for adjustment and management of vascular access device: Secondary | ICD-10-CM | POA: Diagnosis not present

## 2021-11-24 DIAGNOSIS — J939 Pneumothorax, unspecified: Secondary | ICD-10-CM | POA: Diagnosis not present

## 2021-11-25 DIAGNOSIS — J982 Interstitial emphysema: Secondary | ICD-10-CM | POA: Diagnosis not present

## 2021-11-25 DIAGNOSIS — T797XXA Traumatic subcutaneous emphysema, initial encounter: Secondary | ICD-10-CM | POA: Diagnosis not present

## 2021-11-25 DIAGNOSIS — J939 Pneumothorax, unspecified: Secondary | ICD-10-CM | POA: Diagnosis not present

## 2021-11-25 DIAGNOSIS — C3411 Malignant neoplasm of upper lobe, right bronchus or lung: Secondary | ICD-10-CM | POA: Diagnosis not present

## 2021-11-25 DIAGNOSIS — G8918 Other acute postprocedural pain: Secondary | ICD-10-CM | POA: Diagnosis not present

## 2021-11-25 DIAGNOSIS — H43811 Vitreous degeneration, right eye: Secondary | ICD-10-CM | POA: Diagnosis not present

## 2021-11-25 DIAGNOSIS — Z4682 Encounter for fitting and adjustment of non-vascular catheter: Secondary | ICD-10-CM | POA: Diagnosis not present

## 2021-11-26 DIAGNOSIS — G8918 Other acute postprocedural pain: Secondary | ICD-10-CM | POA: Diagnosis not present

## 2021-11-26 DIAGNOSIS — R918 Other nonspecific abnormal finding of lung field: Secondary | ICD-10-CM | POA: Diagnosis not present

## 2021-11-26 DIAGNOSIS — Z01818 Encounter for other preprocedural examination: Secondary | ICD-10-CM | POA: Diagnosis not present

## 2021-11-26 DIAGNOSIS — C3411 Malignant neoplasm of upper lobe, right bronchus or lung: Secondary | ICD-10-CM | POA: Diagnosis not present

## 2021-11-26 DIAGNOSIS — H43811 Vitreous degeneration, right eye: Secondary | ICD-10-CM | POA: Diagnosis not present

## 2021-11-27 DIAGNOSIS — Z01818 Encounter for other preprocedural examination: Secondary | ICD-10-CM | POA: Diagnosis not present

## 2021-11-27 DIAGNOSIS — Z4682 Encounter for fitting and adjustment of non-vascular catheter: Secondary | ICD-10-CM | POA: Diagnosis not present

## 2021-11-27 DIAGNOSIS — G8918 Other acute postprocedural pain: Secondary | ICD-10-CM | POA: Diagnosis not present

## 2021-11-27 DIAGNOSIS — C3411 Malignant neoplasm of upper lobe, right bronchus or lung: Secondary | ICD-10-CM | POA: Diagnosis not present

## 2021-11-28 DIAGNOSIS — Z01818 Encounter for other preprocedural examination: Secondary | ICD-10-CM | POA: Diagnosis not present

## 2021-11-28 DIAGNOSIS — J939 Pneumothorax, unspecified: Secondary | ICD-10-CM | POA: Diagnosis not present

## 2021-11-28 DIAGNOSIS — J9811 Atelectasis: Secondary | ICD-10-CM | POA: Diagnosis not present

## 2021-11-28 DIAGNOSIS — C3411 Malignant neoplasm of upper lobe, right bronchus or lung: Secondary | ICD-10-CM | POA: Diagnosis not present

## 2021-11-28 DIAGNOSIS — Z4682 Encounter for fitting and adjustment of non-vascular catheter: Secondary | ICD-10-CM | POA: Diagnosis not present

## 2021-11-30 ENCOUNTER — Other Ambulatory Visit: Payer: Self-pay | Admitting: Internal Medicine

## 2021-12-03 ENCOUNTER — Telehealth: Payer: Self-pay | Admitting: *Deleted

## 2021-12-03 ENCOUNTER — Encounter: Payer: Self-pay | Admitting: *Deleted

## 2021-12-03 ENCOUNTER — Telehealth: Payer: Self-pay | Admitting: Gastroenterology

## 2021-12-03 DIAGNOSIS — K219 Gastro-esophageal reflux disease without esophagitis: Secondary | ICD-10-CM

## 2021-12-03 MED ORDER — OMEPRAZOLE 40 MG PO CPDR: 40.0000 mg | DELAYED_RELEASE_CAPSULE | Freq: Two times a day (BID) | ORAL | 8 refills | Status: DC

## 2021-12-03 NOTE — Telephone Encounter (Signed)
Patient is calling in regards to Omeprazole Rx he is requesting [facility] [address] in [place]. Please advise

## 2021-12-03 NOTE — Patient Outreach (Signed)
  Care Coordination TOC Note Transition Care Management Follow-up Telephone Call Date of discharge and from where: Wednesday 11/28/21 [facility]; (R) UL lobectomy; lung CA How have you been since you were released from the hospital? "I am getting better every day, things are going well and I am not having any problems, I am really able to do everything I need to own my own.  My son-in-law and family are helping me, because on Friday after I got home, my wife got sick and turns out she had [place]!  She is under quarantine and staying away from me.... so far, I have not developed any signs or symptoms" Any questions or concerns? Yes- wife contracted COVID- 78 post- patient hospital discharge; patient reports wife is quarantined and reports he has not developed any signs/ symptoms COVID-19  Items Reviewed: Did the pt receive and understand the discharge instructions provided? Yes  Medications obtained and verified? Yes  Other? No  Any new allergies since your discharge? No  Dietary orders reviewed? Yes Do you have support at home? Yes  spouse and family members assisting with care needs as needed/ indicated  Home Care and Equipment/Supplies: Were home health services ordered? no If so, what is the name of the agency? N/A  Has the agency set up a time to come to the patient's home? not applicable Were any new equipment or medical supplies ordered?  Yes: rollator What is the name of the medical supply agency? "I don't remember" Were you able to get the supplies/equipment? Yes- delivered to patient at hospital prior to discharge Do you have any questions related to the use of the equipment or supplies? No  Functional Questionnaire: (I = Independent and D = Dependent) ADLs: I spouse and family members assisting with care needs as needed/ indicated  Bathing/Dressing- I  Meal Prep- I  spouse and family members assisting with care needs as needed/ indicated  Eating-  I  Maintaining continence- I  Transferring/Ambulation- I  Managing Meds- I  Follow up appointments reviewed:  PCP Hospital f/u appt confirmed? No  Scheduled to see - on - @ [facility] f/u appt confirmed? Yes  Scheduled to see Oncology provider at [facility] on Thursday 12/06/21 @ 10:30 am Are transportation arrangements needed? No  If their condition worsens, is the pt aware to call PCP or go to the Emergency Dept.? Yes Was the patient provided with contact information for the PCP's office or ED? No- reports already has phone numbers Was to pt encouraged to call back with questions or concerns? Yes  SDOH assessments and interventions completed:   Yes  Care Coordination Interventions Activated:  Yes   Care Coordination Interventions:  Discussed/ provided education around signs/ symptoms COVID patient should watch out for, along with corresponding action plan should he develop signs/ symptoms; provided education around importance of good nutrition in setting of recent surgery     Encounter Outcome:  Pt. Visit Completed    Oneta Rack, RN, BSN, CCRN Alumnus RN CM Care Coordination/ Transition of [place] Management [phone]: direct office

## 2021-12-03 NOTE — Telephone Encounter (Signed)
[facility]

## 2021-12-04 DIAGNOSIS — N343 Urethral syndrome, unspecified: Secondary | ICD-10-CM | POA: Diagnosis not present

## 2021-12-04 DIAGNOSIS — C3412 Malignant neoplasm of upper lobe, left bronchus or lung: Secondary | ICD-10-CM | POA: Diagnosis not present

## 2021-12-04 DIAGNOSIS — J939 Pneumothorax, unspecified: Secondary | ICD-10-CM | POA: Diagnosis not present

## 2021-12-04 DIAGNOSIS — C3411 Malignant neoplasm of upper lobe, right bronchus or lung: Secondary | ICD-10-CM | POA: Diagnosis not present

## 2021-12-04 DIAGNOSIS — R059 Cough, unspecified: Secondary | ICD-10-CM | POA: Diagnosis not present

## 2021-12-04 DIAGNOSIS — R918 Other nonspecific abnormal finding of lung field: Secondary | ICD-10-CM | POA: Diagnosis not present

## 2021-12-04 DIAGNOSIS — J948 Other specified pleural conditions: Secondary | ICD-10-CM | POA: Diagnosis not present

## 2021-12-05 DIAGNOSIS — R918 Other nonspecific abnormal finding of lung field: Secondary | ICD-10-CM | POA: Diagnosis not present

## 2021-12-05 DIAGNOSIS — Z87891 Personal history of nicotine dependence: Secondary | ICD-10-CM | POA: Diagnosis not present

## 2021-12-05 DIAGNOSIS — J948 Other specified pleural conditions: Secondary | ICD-10-CM | POA: Diagnosis not present

## 2021-12-05 DIAGNOSIS — C3411 Malignant neoplasm of upper lobe, right bronchus or lung: Secondary | ICD-10-CM | POA: Diagnosis not present

## 2021-12-05 DIAGNOSIS — Z01818 Encounter for other preprocedural examination: Secondary | ICD-10-CM | POA: Diagnosis not present

## 2021-12-05 DIAGNOSIS — J982 Interstitial emphysema: Secondary | ICD-10-CM | POA: Diagnosis not present

## 2021-12-05 DIAGNOSIS — R0602 Shortness of breath: Secondary | ICD-10-CM | POA: Diagnosis not present

## 2021-12-17 DIAGNOSIS — C3411 Malignant neoplasm of upper lobe, right bronchus or lung: Secondary | ICD-10-CM | POA: Diagnosis not present

## 2021-12-17 DIAGNOSIS — Z79899 Other long term (current) drug therapy: Secondary | ICD-10-CM | POA: Diagnosis not present

## 2021-12-17 DIAGNOSIS — Z87891 Personal history of nicotine dependence: Secondary | ICD-10-CM | POA: Diagnosis not present

## 2021-12-17 DIAGNOSIS — Z7982 Long term (current) use of aspirin: Secondary | ICD-10-CM | POA: Diagnosis not present

## 2021-12-17 DIAGNOSIS — Z902 Acquired absence of lung [part of]: Secondary | ICD-10-CM | POA: Diagnosis not present

## 2021-12-18 DIAGNOSIS — C3411 Malignant neoplasm of upper lobe, right bronchus or lung: Secondary | ICD-10-CM | POA: Diagnosis not present

## 2021-12-26 DIAGNOSIS — Z961 Presence of intraocular lens: Secondary | ICD-10-CM | POA: Diagnosis not present

## 2021-12-26 DIAGNOSIS — H52202 Unspecified astigmatism, left eye: Secondary | ICD-10-CM | POA: Diagnosis not present

## 2021-12-26 DIAGNOSIS — H5713 Ocular pain, bilateral: Secondary | ICD-10-CM | POA: Diagnosis not present

## 2021-12-27 ENCOUNTER — Encounter (HOSPITAL_COMMUNITY)
Admission: RE | Admit: 2021-12-27 | Discharge: 2021-12-27 | Disposition: A | Discharge status: Home / Self Care | Payer: Medicare Other | Source: Ambulatory Visit | Attending: Thoracic Surgery (Cardiothoracic Vascular Surgery) | Admitting: Thoracic Surgery (Cardiothoracic Vascular Surgery)

## 2021-12-27 ENCOUNTER — Encounter (HOSPITAL_COMMUNITY): Payer: Self-pay

## 2021-12-27 VITALS — BP 110/60 | HR 80 | Ht 72.0 in | Wt 179.7 lb

## 2021-12-27 DIAGNOSIS — C3411 Malignant neoplasm of upper lobe, right bronchus or lung: Secondary | ICD-10-CM | POA: Diagnosis not present

## 2021-12-27 DIAGNOSIS — C3412 Malignant neoplasm of upper lobe, left bronchus or lung: Secondary | ICD-10-CM | POA: Insufficient documentation

## 2021-12-27 NOTE — Progress Notes (Signed)
Pulmonary Individual Treatment Plan  Patient Details  Name: ADON GEHLHAUSEN MRN: [id number] Date of Birth: November 29, 1945 Referring Provider:   [place] from 12/27/2021 in [place]  Referring Provider Dr. Williemae Natter       Initial Encounter Date:  Flowsheet Row PULMONARY REHAB OTHER RESP ORIENTATION from 12/27/2021 in [place]  Date 12/27/21       Visit Diagnosis: Malignant neoplasm of right upper lobe of lung ([place])  Patient's Home Medications on Admission:   Current Outpatient Medications:    acetaminophen (TYLENOL) 500 MG tablet, Take 500 mg by mouth every 6 (six) hours as needed (for pain.)., Disp: , Rfl:    aspirin EC 81 MG tablet, Take 1 tablet (81 mg total) by mouth at bedtime. Okay to restart this medication on 07/03/2021, Disp: 30 tablet, Rfl: 11   Cholecalciferol (VITAMIN D3) 2000 UNITS TABS, Take 2,000 Units by mouth in the morning and at bedtime., Disp: , Rfl:    Coenzyme Q10 (COQ-10) 100 MG CAPS, Take 100 mg by mouth in the morning., Disp: , Rfl:    Cyanocobalamin (B-12 PO), Take 1 tablet by mouth daily., Disp: , Rfl:    hydrALAZINE (APRESOLINE) 25 MG tablet, Take 1 tablet (25 mg total) by mouth 3 (three) times daily., Disp: 270 tablet, Rfl: 3   irbesartan (AVAPRO) 300 MG tablet, Take 1 tablet (300 mg total) by mouth daily., Disp: 90 tablet, Rfl: 3   nitroGLYCERIN (NITROSTAT) 0.4 MG SL tablet, Place 1 tablet (0.4 mg total) under the tongue every 5 (five) minutes x 3 doses as needed for chest pain (if no relief after 2nd dose, proceed to ED or call 911)., Disp: 25 tablet, Rfl: 3   Omega-3 Fatty Acids (OMEGA-3 FISH OIL PO), Take 1,000 mg by mouth in the morning., Disp: , Rfl:    omeprazole (PRILOSEC) 40 MG capsule, Take 1 capsule (40 mg total) by mouth in the morning and at bedtime. (Patient taking differently: Take 40 mg by mouth in the morning.), Disp: 60 capsule, Rfl: 8   ondansetron  (ZOFRAN) 8 MG tablet, Take 8 mg by mouth every 8 (eight) hours as needed for vomiting or nausea., Disp: , Rfl:    Polyethyl Glycol-Propyl Glycol (LUBRICANT EYE DROPS) 0.4-0.3 % SOLN, Place 1-2 drops into both eyes 3 (three) times daily as needed (dry/irritated eyes.)., Disp: , Rfl:    rosuvastatin (CRESTOR) 20 MG tablet, TAKE HALF A TABLET (10 MG) BY MOUTH ONCE DAILY., Disp: 45 tablet, Rfl: 3   tadalafil (CIALIS) 5 MG tablet, Take 5 mg by mouth daily as needed for erectile dysfunction., Disp: , Rfl:    tamsulosin (FLOMAX) 0.4 MG CAPS capsule, Take 0.4 mg by mouth in the morning., Disp: , Rfl:    Testosterone 20.25 MG/ACT (1.62%) GEL, Apply 2 Pump topically in the morning., Disp: , Rfl:    vitamin C (ASCORBIC ACID) 500 MG tablet, Take 500 mg by mouth in the morning., Disp: , Rfl:   Past Medical History: Past Medical History:  Diagnosis Date   Arthritis    CAD (coronary artery disease)    CABG with LIMA to LAD and RIMA to RCA August 2000 - Dr. Roxan Hockey   Cancer [facility])    basal cell   CHF (congestive heart failure) (HCC)    COPD (chronic obstructive pulmonary disease) (HCC)    Elevated glycated hemoglobin    Essential hypertension    GERD (gastroesophageal reflux disease)    Heart  murmur    Hyperlipidemia    Hypopituitarism ([place])    Lung cancer ([place])    Osteopenia    Dr Wilson Singer   Pancreatitis    Peyronie disease    Testosterone deficiency    Dr Wilson Singer    Tobacco Use: Social History   Tobacco Use  Smoking Status Some Days   Types: Cigarettes   Last attempt to quit: 01/07/1998   Years since quitting: 23.9  Smokeless Tobacco Never  Tobacco Comments   Smokes "every once in a while"   07/25/21 [facility]     Labs: Review Flowsheet  More data exists      Latest Ref Rng & Units 08/22/2017 12/23/2017 08/24/2018 08/25/2019 08/29/2020  Labs for ITP Cardiac and Pulmonary Rehab  Cholestrol 0 - 200 mg/dL 128  137  146  122  118   LDL (calc) 0 - 99 mg/dL 71  81  86  71  65   HDL-C >39.00 mg/dL  29.10  36  34.20  33  33.60   Trlycerides 0.0 - 149.0 mg/dL 140.0  101  130.0  98  96.0   Hemoglobin A1c 4.6 - 6.5 % 6.2  5.9  6.1  5.5  6.0     Capillary Blood Glucose: Lab Results  Component Value Date   GLUCAP 117 (H) 07/23/2021     Pulmonary Assessment Scores:  Pulmonary Assessment Scores     Row Name 09/05/21 1249 12/27/21 1312       ADL UCSD   ADL Phase Entry Entry    SOB Score total 16 69    Rest 0 2    Walk 2 3    Stairs 3 4    Bath 0 3    Dress 0 3    Shop 0 3      CAT Score   CAT Score 16 23      mMRC Score   mMRC Score 0 3            UCSD: Self-administered rating of dyspnea associated with activities of daily living (ADLs) 6-point scale (0 = "not at all" to 5 = "maximal or unable to do because of breathlessness")  Scoring Scores range from 0 to 120.  Minimally important difference is 5 units  CAT: CAT can identify the health impairment of COPD patients and is better correlated with disease progression.  CAT has a scoring range of zero to 40. The CAT score is classified into four groups of low (less than 10), medium (10 - 20), high (21-30) and very high (31-40) based on the impact level of disease on health status. A CAT score over 10 suggests significant symptoms.  A worsening CAT score could be explained by an exacerbation, poor medication adherence, poor inhaler technique, or progression of COPD or comorbid conditions.  CAT MCID is 2 points  mMRC: mMRC (Modified Medical Research Council) Dyspnea Scale is used to assess the degree of baseline functional disability in patients of respiratory disease due to dyspnea. No minimal important difference is established. A decrease in score of 1 point or greater is considered a positive change.   Pulmonary Function Assessment:   Exercise Target Goals: Exercise Program Goal: Individual exercise prescription set using results from initial 6 min walk test and THRR while considering  patient's activity  barriers and safety.   Exercise Prescription Goal: Initial exercise prescription builds to 30-45 minutes a day of aerobic activity, 2-3 days per week.  Home exercise guidelines will be given to  patient during program as part of exercise prescription that the participant will acknowledge.  Activity Barriers & Risk Stratification:  Activity Barriers & Cardiac Risk Stratification - 12/27/21 1250       Activity Barriers & Cardiac Risk Stratification   Activity Barriers Shortness of Breath;Deconditioning;Neck/Spine Problems    Cardiac Risk Stratification High             6 Minute Walk:  6 Minute Walk     Row Name 09/05/21 1311 12/27/21 1331       6 Minute Walk   Phase Initial Initial    Distance 1400 feet 1200 feet    Walk Time 6 minutes 6 minutes    # of Rest Breaks 0 0    MPH 2.65 2.27    METS 2.92 2.88    RPE 11 12    Perceived Dyspnea  11 12    VO2 Peak 10.24 10.09    Symptoms No No    Resting HR 68 bpm 80 bpm    Resting BP 132/62 110/60    Resting Oxygen Saturation  95 % 96 %    Exercise Oxygen Saturation  during 6 min walk 94 % 93 %    Max Ex. HR 75 bpm 121 bpm    Max Ex. BP 160/60 130/60    2 Minute Post BP 130/60 120/60      Interval HR   1 Minute HR 74 114    2 Minute HR 75 105    3 Minute HR 74 110    4 Minute HR 74 105    5 Minute HR 74 104    6 Minute HR 75 121    2 Minute Post HR 65 80    Interval Heart Rate? Yes Yes      Interval Oxygen   Interval Oxygen? Yes Yes    Baseline Oxygen Saturation % 95 % 96 %    1 Minute Oxygen Saturation % 95 % 96 %    1 Minute Liters of Oxygen 0 L 0 L    2 Minute Oxygen Saturation % 95 % 94 %    2 Minute Liters of Oxygen 0 L 0 L    3 Minute Oxygen Saturation % 94 % 95 %    3 Minute Liters of Oxygen 0 L 0 L    4 Minute Oxygen Saturation % 95 % 93 %    4 Minute Liters of Oxygen 0 L 0 L    5 Minute Oxygen Saturation % 95 % 95 %    5 Minute Liters of Oxygen 0 L 0 L    6 Minute Oxygen Saturation % 95 % 94 %    6  Minute Liters of Oxygen 0 L 0 L    2 Minute Post Oxygen Saturation % 95 % 96 %    2 Minute Post Liters of Oxygen 0 L 0 L             Oxygen Initial Assessment:  Oxygen Initial Assessment - 12/27/21 1311       Home Oxygen   Home Oxygen Device None    Sleep Oxygen Prescription None    Home Exercise Oxygen Prescription None    Home Resting Oxygen Prescription None      Initial 6 min Walk   Oxygen Used None      Program Oxygen Prescription   Program Oxygen Prescription None      Intervention   Short Term Goals To learn and  understand importance of monitoring SPO2 with pulse oximeter and demonstrate accurate use of the pulse oximeter.;To learn and understand importance of maintaining oxygen saturations>88%;To learn and demonstrate proper pursed lip breathing techniques or other breathing techniques.     Long  Term Goals Verbalizes importance of monitoring SPO2 with pulse oximeter and return demonstration;Maintenance of O2 saturations>88%;Exhibits proper breathing techniques, such as pursed lip breathing or other method taught during program session             Oxygen Re-Evaluation:  Oxygen Re-Evaluation     Row Name 09/18/21 1428 10/16/21 1359           Program Oxygen Prescription   Program Oxygen Prescription None None        Home Oxygen   Home Oxygen Device None None      Sleep Oxygen Prescription None None      Home Exercise Oxygen Prescription None None      Home Resting Oxygen Prescription None None      Compliance with Home Oxygen Use Yes Yes        Goals/Expected Outcomes   Short Term Goals To learn and understand importance of maintaining oxygen saturations>88%;To learn and demonstrate proper use of respiratory medications;To learn and understand importance of monitoring SPO2 with pulse oximeter and demonstrate accurate use of the pulse oximeter. To learn and understand importance of maintaining oxygen saturations>88%;To learn and demonstrate proper use of  respiratory medications;To learn and understand importance of monitoring SPO2 with pulse oximeter and demonstrate accurate use of the pulse oximeter.      Long  Term Goals -- Verbalizes importance of monitoring SPO2 with pulse oximeter and return demonstration;Maintenance of O2 saturations>88%;Exhibits proper breathing techniques, such as pursed lip breathing or other method taught during program session;Compliance with respiratory medication      Goals/Expected Outcomes compliance compliance               Oxygen Discharge (Final Oxygen Re-Evaluation):  Oxygen Re-Evaluation - 10/16/21 1359       Program Oxygen Prescription   Program Oxygen Prescription None      Home Oxygen   Home Oxygen Device None    Sleep Oxygen Prescription None    Home Exercise Oxygen Prescription None    Home Resting Oxygen Prescription None    Compliance with Home Oxygen Use Yes      Goals/Expected Outcomes   Short Term Goals To learn and understand importance of maintaining oxygen saturations>88%;To learn and demonstrate proper use of respiratory medications;To learn and understand importance of monitoring SPO2 with pulse oximeter and demonstrate accurate use of the pulse oximeter.    Long  Term Goals Verbalizes importance of monitoring SPO2 with pulse oximeter and return demonstration;Maintenance of O2 saturations>88%;Exhibits proper breathing techniques, such as pursed lip breathing or other method taught during program session;Compliance with respiratory medication    Goals/Expected Outcomes compliance             Initial Exercise Prescription:  Initial Exercise Prescription - 12/27/21 1300       Date of Initial Exercise RX and Referring Provider   Date 12/27/21    Referring Provider Dr. Williemae Natter    Expected Discharge Date 04/30/22      Treadmill   MPH 1.5    Grade 0    Minutes 17      Recumbant Elliptical   Level 1    RPM 45    Minutes 22      Prescription Details   Frequency (times per  week) 2    Duration Progress to 30 minutes of continuous aerobic without signs/symptoms of physical distress      Intensity   THRR 40-80% of Max Heartrate 58-115    Ratings of Perceived Exertion 11-13    Perceived Dyspnea 0-4      Resistance Training   Training Prescription Yes    Weight 4    Reps 10-15             Perform Capillary Blood Glucose checks as needed.  Exercise Prescription Changes:   Exercise Prescription Changes     Row Name 09/18/21 1400 10/04/21 1400 10/11/21 1400 10/30/21 1400       Response to Exercise   Blood Pressure (Admit) 140/64 128/60 120/60 114/60    Blood Pressure (Exercise) 128/62 160/72 150/60 124/60    Blood Pressure (Exit) 120/62 130/60 120/60 122/64    Heart Rate (Admit) 68 bpm 82 bpm 78 bpm 82 bpm    Heart Rate (Exercise) 92 bpm 90 bpm 85 bpm 90 bpm    Heart Rate (Exit) 78 bpm 79 bpm 86 bpm 90 bpm    Oxygen Saturation (Admit) 93 % 94 % 96 % 96 %    Oxygen Saturation (Exercise) 93 % 93 % 94 % 95 %    Oxygen Saturation (Exit) 93 % 95 % 94 % 94 %    Rating of Perceived Exertion (Exercise) 11 12 12 12     Perceived Dyspnea (Exercise) 11 12 12 12     Duration Continue with 30 min of aerobic exercise without signs/symptoms of physical distress. Continue with 30 min of aerobic exercise without signs/symptoms of physical distress. Continue with 30 min of aerobic exercise without signs/symptoms of physical distress. Continue with 30 min of aerobic exercise without signs/symptoms of physical distress.    Intensity THRR unchanged THRR unchanged THRR unchanged THRR unchanged      Progression   Progression Continue to progress workloads to maintain intensity without signs/symptoms of physical distress. Continue to progress workloads to maintain intensity without signs/symptoms of physical distress. Continue to progress workloads to maintain intensity without signs/symptoms of physical distress. Continue to progress workloads to maintain intensity without  signs/symptoms of physical distress.      Resistance Training   Training Prescription Yes Yes Yes Yes    Weight 4 4 4 4     Reps 10-15 10-15 10-15 10-15    Time 10 Minutes 10 Minutes 10 Minutes 10 Minutes      Treadmill   MPH 1.8 2.5 2.5 2.4    Grade 0 0 0 0    Minutes 17 17 17 17     METs 2.38 2.91 2.91 2.87      Recumbant Elliptical   Level 1 2 1 1     RPM 53 57 54 44    Minutes 22 22 22 22     METs 3.3 3.8 3 2.3             Exercise Comments:   Exercise Goals and Review:   Exercise Goals     Row Name 09/05/21 1315 09/18/21 1425 10/16/21 1355 12/27/21 1334       Exercise Goals   Increase Physical Activity Yes Yes Yes Yes    Intervention Provide advice, education, support and counseling about physical activity/exercise needs.;Develop an individualized exercise prescription for aerobic and resistive training based on initial evaluation findings, risk stratification, comorbidities and participant's personal goals. Provide advice, education, support and counseling about physical activity/exercise needs.;Develop an individualized exercise prescription for aerobic  and resistive training based on initial evaluation findings, risk stratification, comorbidities and participant's personal goals. Provide advice, education, support and counseling about physical activity/exercise needs.;Develop an individualized exercise prescription for aerobic and resistive training based on initial evaluation findings, risk stratification, comorbidities and participant's personal goals. Provide advice, education, support and counseling about physical activity/exercise needs.;Develop an individualized exercise prescription for aerobic and resistive training based on initial evaluation findings, risk stratification, comorbidities and participant's personal goals.    Expected Outcomes Short Term: Attend rehab on a regular basis to increase amount of physical activity.;Long Term: Add in home exercise to make  exercise part of routine and to increase amount of physical activity.;Long Term: Exercising regularly at least 3-5 days a week. Short Term: Attend rehab on a regular basis to increase amount of physical activity.;Long Term: Add in home exercise to make exercise part of routine and to increase amount of physical activity.;Long Term: Exercising regularly at least 3-5 days a week. Short Term: Attend rehab on a regular basis to increase amount of physical activity.;Long Term: Add in home exercise to make exercise part of routine and to increase amount of physical activity.;Long Term: Exercising regularly at least 3-5 days a week. Short Term: Attend rehab on a regular basis to increase amount of physical activity.;Long Term: Add in home exercise to make exercise part of routine and to increase amount of physical activity.;Long Term: Exercising regularly at least 3-5 days a week.    Increase Strength and Stamina Yes Yes Yes Yes    Intervention Provide advice, education, support and counseling about physical activity/exercise needs.;Develop an individualized exercise prescription for aerobic and resistive training based on initial evaluation findings, risk stratification, comorbidities and participant's personal goals. Provide advice, education, support and counseling about physical activity/exercise needs.;Develop an individualized exercise prescription for aerobic and resistive training based on initial evaluation findings, risk stratification, comorbidities and participant's personal goals. Provide advice, education, support and counseling about physical activity/exercise needs.;Develop an individualized exercise prescription for aerobic and resistive training based on initial evaluation findings, risk stratification, comorbidities and participant's personal goals. Provide advice, education, support and counseling about physical activity/exercise needs.;Develop an individualized exercise prescription for aerobic and  resistive training based on initial evaluation findings, risk stratification, comorbidities and participant's personal goals.    Expected Outcomes Short Term: Increase workloads from initial exercise prescription for resistance, speed, and METs.;Short Term: Perform resistance training exercises routinely during rehab and add in resistance training at home;Long Term: Improve cardiorespiratory fitness, muscular endurance and strength as measured by increased METs and functional capacity (6MWT) Short Term: Increase workloads from initial exercise prescription for resistance, speed, and METs.;Short Term: Perform resistance training exercises routinely during rehab and add in resistance training at home;Long Term: Improve cardiorespiratory fitness, muscular endurance and strength as measured by increased METs and functional capacity (6MWT) Short Term: Increase workloads from initial exercise prescription for resistance, speed, and METs.;Short Term: Perform resistance training exercises routinely during rehab and add in resistance training at home;Long Term: Improve cardiorespiratory fitness, muscular endurance and strength as measured by increased METs and functional capacity (6MWT) Short Term: Increase workloads from initial exercise prescription for resistance, speed, and METs.;Short Term: Perform resistance training exercises routinely during rehab and add in resistance training at home;Long Term: Improve cardiorespiratory fitness, muscular endurance and strength as measured by increased METs and functional capacity (6MWT)    Able to understand and use rate of perceived exertion (RPE) scale Yes Yes Yes Yes    Intervention Provide education and explanation on how  to use RPE scale Provide education and explanation on how to use RPE scale Provide education and explanation on how to use RPE scale Provide education and explanation on how to use RPE scale    Expected Outcomes Short Term: Able to use RPE daily in rehab  to express subjective intensity level;Long Term:  Able to use RPE to guide intensity level when exercising independently Short Term: Able to use RPE daily in rehab to express subjective intensity level;Long Term:  Able to use RPE to guide intensity level when exercising independently -- Short Term: Able to use RPE daily in rehab to express subjective intensity level;Long Term:  Able to use RPE to guide intensity level when exercising independently    Able to understand and use Dyspnea scale Yes Yes Yes Yes    Intervention Provide education and explanation on how to use Dyspnea scale Provide education and explanation on how to use Dyspnea scale Provide education and explanation on how to use Dyspnea scale Provide education and explanation on how to use Dyspnea scale    Expected Outcomes Short Term: Able to use Dyspnea scale daily in rehab to express subjective sense of shortness of breath during exertion;Long Term: Able to use Dyspnea scale to guide intensity level when exercising independently Short Term: Able to use Dyspnea scale daily in rehab to express subjective sense of shortness of breath during exertion;Long Term: Able to use Dyspnea scale to guide intensity level when exercising independently Short Term: Able to use Dyspnea scale daily in rehab to express subjective sense of shortness of breath during exertion;Long Term: Able to use Dyspnea scale to guide intensity level when exercising independently Short Term: Able to use Dyspnea scale daily in rehab to express subjective sense of shortness of breath during exertion;Long Term: Able to use Dyspnea scale to guide intensity level when exercising independently    Knowledge and understanding of Target Heart Rate Range (THRR) Yes Yes Yes Yes    Intervention Provide education and explanation of THRR including how the numbers were predicted and where they are located for reference Provide education and explanation of THRR including how the numbers were  predicted and where they are located for reference Provide education and explanation of THRR including how the numbers were predicted and where they are located for reference Provide education and explanation of THRR including how the numbers were predicted and where they are located for reference    Expected Outcomes Short Term: Able to state/look up THRR;Short Term: Able to use daily as guideline for intensity in rehab;Long Term: Able to use THRR to govern intensity when exercising independently Short Term: Able to state/look up THRR;Short Term: Able to use daily as guideline for intensity in rehab;Long Term: Able to use THRR to govern intensity when exercising independently Short Term: Able to state/look up THRR;Short Term: Able to use daily as guideline for intensity in rehab;Long Term: Able to use THRR to govern intensity when exercising independently Short Term: Able to state/look up THRR;Short Term: Able to use daily as guideline for intensity in rehab;Long Term: Able to use THRR to govern intensity when exercising independently    Understanding of Exercise Prescription Yes Yes Yes Yes    Intervention Provide education, explanation, and written materials on patient's individual exercise prescription Provide education, explanation, and written materials on patient's individual exercise prescription Provide education, explanation, and written materials on patient's individual exercise prescription Provide education, explanation, and written materials on patient's individual exercise prescription    Expected Outcomes Short  Term: Able to explain program exercise prescription;Long Term: Able to explain home exercise prescription to exercise independently Short Term: Able to explain program exercise prescription;Long Term: Able to explain home exercise prescription to exercise independently Short Term: Able to explain program exercise prescription;Long Term: Able to explain home exercise prescription to  exercise independently Short Term: Able to explain program exercise prescription;Long Term: Able to explain home exercise prescription to exercise independently             Exercise Goals Re-Evaluation :  Exercise Goals Re-Evaluation     Row Name 09/18/21 1425 10/16/21 1355           Exercise Goal Re-Evaluation   Exercise Goals Review Increase Physical Activity;Increase Strength and Stamina;Able to understand and use rate of perceived exertion (RPE) scale;Able to understand and use Dyspnea scale;Knowledge and understanding of Target Heart Rate Range (THRR);Understanding of Exercise Prescription Increase Strength and Stamina;Increase Physical Activity;Able to understand and use rate of perceived exertion (RPE) scale;Able to understand and use Dyspnea scale;Knowledge and understanding of Target Heart Rate Range (THRR);Understanding of Exercise Prescription      Comments Pt has completed 2 sessions of PR. He seems to enjoy coming to class so far and is eager to get going. He is increasing his speed on the treadmill and RPM on the ellp. He has a bike at home that he rides for 20 minutes a day along with doing weights and bands. He is currenlty exercising at 3.3 METs on the ellp. Will continue to monitor and progress as able. Pt has completed 9 sessions of PR. 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 He is increasing his speed on the treadmill and RPM on the ellp. He has been tired recently due to [place] but pushes through. He is still exercising at home on days off from class and treatments. He is currently exercising at 3.0 METs on the ellp. Will continue to monitor and progress as able.      Expected Outcomes Through exercise at home and at rehab, the patient will meet their stated goals. Through exercise at home and at rehab, the patient will meet their stated goals.               Discharge Exercise Prescription (Final Exercise Prescription Changes):  Exercise Prescription Changes -  10/30/21 1400       Response to Exercise   Blood Pressure (Admit) 114/60    Blood Pressure (Exercise) 124/60    Blood Pressure (Exit) 122/64    Heart Rate (Admit) 82 bpm    Heart Rate (Exercise) 90 bpm    Heart Rate (Exit) 90 bpm    Oxygen Saturation (Admit) 96 %    Oxygen Saturation (Exercise) 95 %    Oxygen Saturation (Exit) 94 %    Rating of Perceived Exertion (Exercise) 12    Perceived Dyspnea (Exercise) 12    Duration Continue with 30 min of aerobic exercise without signs/symptoms of physical distress.    Intensity THRR unchanged      Progression   Progression Continue to progress workloads to maintain intensity without signs/symptoms of physical distress.      Resistance Training   Training Prescription Yes    Weight 4    Reps 10-15    Time 10 Minutes      Treadmill   MPH 2.4    Grade 0    Minutes 17    METs 2.87      Recumbant Elliptical   Level 1  RPM 44    Minutes 22    METs 2.3             Nutrition:  Target Goals: Understanding of nutrition guidelines, daily intake of sodium 1500mg , cholesterol 200mg , calories 30% from fat and 7% or less from saturated fats, daily to have 5 or more servings of fruits and vegetables.  Biometrics:  Pre Biometrics - 12/27/21 1335       Pre Biometrics   Height 6' (1.829 m)    Weight 81.5 kg    Waist Circumference 40 inches    Hip Circumference 39 inches    Waist to Hip Ratio 1.03 %    BMI (Calculated) 24.36    Triceps Skinfold 10 mm    % Body Fat 24.7 %    Grip Strength 28.6 kg    Flexibility 0 in    Single Leg Stand 0 seconds              Nutrition Therapy Plan and Nutrition Goals:  Nutrition Therapy & Goals - 12/27/21 1314       Personal Nutrition Goals   Comments Patient scored 43 on his diet assessment. Handout provided and explained regarding healthier choices. We offer 2 educational sessions on heart healthy nutrition and assistance with RD referral if patient is interested.       Intervention Plan   Intervention Nutrition handout(s) given to patient.    Expected Outcomes Short Term Goal: Understand basic principles of dietary content, such as calories, fat, sodium, cholesterol and nutrients.             Nutrition Assessments:  Nutrition Assessments - 12/27/21 1314       MEDFICTS Scores   Pre Score 43            MEDIFICTS Score Key: ?70 Need to make dietary changes  40-70 Heart Healthy Diet ? 40 Therapeutic Level Cholesterol Diet   Picture Your Plate Scores: <16 Unhealthy dietary pattern with much room for improvement. 41-50 Dietary pattern unlikely to meet recommendations for good health and room for improvement. 51-60 More healthful dietary pattern, with some room for improvement.  >60 Healthy dietary pattern, although there may be some specific behaviors that could be improved.    Nutrition Goals Re-Evaluation:   Nutrition Goals Discharge (Final Nutrition Goals Re-Evaluation):   Psychosocial: Target Goals: Acknowledge presence or absence of significant depression and/or stress, maximize coping skills, provide positive support system. Participant is able to verbalize types and ability to use techniques and skills needed for reducing stress and depression.  Initial Review & Psychosocial Screening:  Initial Psych Review & Screening - 12/27/21 1317       Initial Review   Current issues with None Identified      Family Dynamics   Good Support System? Yes      Barriers   Psychosocial barriers to participate in program There are no identifiable barriers or psychosocial needs.      Screening Interventions   Interventions Encouraged to exercise;Provide feedback about the scores to participant    Expected Outcomes Short Term goal: Identification and review with participant of any Quality of Life or Depression concerns found by scoring the questionnaire.             Quality of Life Scores:  Quality of Life - 12/27/21 1338        Quality of Life   Select Quality of Life      Quality of Life Scores   Health/Function Pre 27.48 %  Socioeconomic Pre 24.81 %    Psych/Spiritual Pre 30 %    Family Pre 30 %    GLOBAL Pre 30 %            Scores of 19 and below usually indicate a poorer quality of life in these areas.  A difference of  2-3 points is a clinically meaningful difference.  A difference of 2-3 points in the total score of the Quality of Life Index has been associated with significant improvement in overall quality of life, self-image, physical symptoms, and general health in studies assessing change in quality of life.   PHQ-9: Review Flowsheet  More data exists      12/27/2021 09/24/2021 09/05/2021 08/29/2021 09/18/2020  Depression screen PHQ 2/9  Decreased Interest 0 0 0 0 0  Down, Depressed, Hopeless 0 0 0 0 0  PHQ - 2 Score 0 0 0 0 0  Altered sleeping 0 0 0 0 -  Tired, decreased energy 0 0 0 0 -  Change in appetite 0 0 0 0 -  Feeling bad or failure about yourself  0 0 0 0 -  Trouble concentrating 0 0 0 0 -  Moving slowly or fidgety/restless 0 0 0 0 -  Suicidal thoughts 0 0 0 0 -  PHQ-9 Score 0 0 0 0 -  Difficult doing work/chores - Not difficult at all - Not difficult at all -   Interpretation of Total Score  Total Score Depression Severity:  1-4 = Minimal depression, 5-9 = Mild depression, 10-14 = Moderate depression, 15-19 = Moderately severe depression, 20-27 = Severe depression   Psychosocial Evaluation and Intervention:  Psychosocial Evaluation - 12/27/21 1318       Psychosocial Evaluation & Interventions   Interventions Encouraged to exercise with the program and follow exercise prescription;Stress management education;Relaxation education    Comments Patient has no psychosocial barriers identified to participate in PR at his orientation visit. His PHQ-9 score was 0.  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 He has cancer in  his left lung and 18 out of 20 lymph nodes taken during the surgery were postive for cancer. Patient says he was told his case is complicated and they have not decided how to treat the remaining cancer moving forward. He says they have been talking about immunotherapy but no decision has been made. He says he is more SOB now than before his surgery. He hopes this will improve. He has a good support system with his wife. He is ready to start the program hoping to get back to his normal strength level.    Expected Outcomes Pt will continue to have no identifiable psychosocial issues.    Continue Psychosocial Services  No Follow up required             Psychosocial Re-Evaluation:  Psychosocial Re-Evaluation     [place] Name 09/19/21 1133 10/08/21 1507           Psychosocial Re-Evaluation   Current issues with None Identified None Identified      Comments Patient is new to the program completing 3 sessions. He continues to have no psychosocial barriers or issues identified. We will continue to monitor. Patient has completed 7 sessions.  He continues to have no psychosocial barriers or issues identified.  He seems to enjoy coming to class and very interactiver with class and staff.  He demonstrates an interest in improving his health.  We will continue to monitor as he progresses in the program.      Expected Outcomes Patient will continue to have no psychosocial barriers or issues identified. Patient will continue to have no psychosocial barriers or issues identified.      Interventions Encouraged to attend Pulmonary Rehabilitation for the exercise;Relaxation education;Encouraged to attend Cardiac Rehabilitation for the exercise Encouraged to attend Pulmonary Rehabilitation for the exercise;Relaxation education;Encouraged to attend Cardiac Rehabilitation for the exercise      Continue Psychosocial Services  No Follow up required No Follow up required               Psychosocial Discharge  (Final Psychosocial Re-Evaluation):  Psychosocial Re-Evaluation - 10/08/21 1507       Psychosocial Re-Evaluation   Current issues with None Identified    Comments Patient has completed 7 sessions.  He continues to have no psychosocial barriers or issues identified.  He seems to enjoy coming to class and very interactiver with class and staff.  He demonstrates an interest in improving his health.   We will continue to monitor as he progresses in the program.    Expected Outcomes Patient will continue to have no psychosocial barriers or issues identified.    Interventions Encouraged to attend Pulmonary Rehabilitation for the exercise;Relaxation education;Encouraged to attend Cardiac Rehabilitation for the exercise    Continue Psychosocial Services  No Follow up required              Education: Education Goals: Education classes will be provided on a weekly basis, covering required topics. Participant will state understanding/return demonstration of topics presented.  Learning Barriers/Preferences:  Learning Barriers/Preferences - 12/27/21 1315       Learning Barriers/Preferences   Learning Barriers None    Learning Preferences Audio;Written Material             Education Topics: How Lungs Work and Diseases: - Discuss the anatomy of the lungs and diseases that can affect the lungs, such as COPD. Flowsheet Row PULMONARY REHAB OTHER RESPIRATORY from 11/08/2021 in [place]  Date 10/11/21  Educator DF  Instruction Review Code 2- Demonstrated Understanding       Exercise: -Discuss the importance of exercise, FITT principles of exercise, normal and abnormal responses to exercise, and how to exercise safely.   Environmental Irritants: -Discuss types of environmental irritants and how to limit exposure to environmental irritants. Flowsheet Row PULMONARY REHAB OTHER RESPIRATORY from 11/08/2021 in [place]  Date 10/18/21   Instruction Review Code 1- Verbalizes Understanding       Meds/Inhalers and oxygen: - Discuss respiratory medications, definition of an inhaler and oxygen, and the proper way to use an inhaler and oxygen. Flowsheet Row PULMONARY REHAB OTHER RESPIRATORY from 11/08/2021 in [place]  Date 10/25/21  Educator HJ       Energy Saving Techniques: - Discuss methods to conserve energy and decrease shortness of breath when performing activities of daily living.  Flowsheet Row PULMONARY REHAB OTHER RESPIRATORY from 11/08/2021 in [place]  Date 11/01/21  Educator HB  Instruction Review Code 1- Verbalizes Understanding       Bronchial Hygiene / Breathing Techniques: - Discuss breathing mechanics, pursed-lip breathing technique,  proper posture, effective ways to clear airways, and other functional breathing techniques Flowsheet Row PULMONARY REHAB OTHER RESPIRATORY from 11/08/2021 in [place]  Date 11/08/21  Educator HB  Instruction Review Code 1- Medical illustrator  Equipment: - Provides group verbal and written instruction about the health risks of elevated stress, cause of high stress, and healthy ways to reduce stress.   Nutrition I: Fats: - Discuss the types of cholesterol, what cholesterol does to the body, and how cholesterol levels can be controlled.   Nutrition II: Labels: -Discuss the different components of food labels and how to read food labels.   Respiratory Infections: - Discuss the signs and symptoms of respiratory infections, ways to prevent respiratory infections, and the importance of seeking medical treatment when having a respiratory infection. Flowsheet Row PULMONARY REHAB OTHER RESPIRATORY from 11/08/2021 in [place]  Date 09/13/21  Educator DM  Instruction Review Code 1- Verbalizes Understanding       Stress I: Signs and Symptoms: -  Discuss the causes of stress, how stress may lead to anxiety and depression, and ways to limit stress. Flowsheet Row PULMONARY REHAB OTHER RESPIRATORY from 11/08/2021 in [facility]  Date 09/20/21  Educator [place]  Instruction Review Code 1- Verbalizes Understanding       Stress II: Relaxation: -Discuss relaxation techniques to limit stress. Flowsheet Row PULMONARY REHAB OTHER RESPIRATORY from 11/08/2021 in [place]  Date 09/27/21  Educator pb  Instruction Review Code 1- Verbalizes Understanding       Oxygen for Home/Travel: - Discuss how to prepare for travel when on oxygen and proper ways to transport and store oxygen to ensure safety. Flowsheet Row PULMONARY REHAB OTHER RESPIRATORY from 11/08/2021 in [place]  Date 10/04/21  Educator DF  Instruction Review Code 2- Demonstrated Understanding       Knowledge Questionnaire Score:  Knowledge Questionnaire Score - 12/27/21 1313       Knowledge Questionnaire Score   Pre Score 15/18             Core Components/Risk Factors/Patient Goals at Admission:  Personal Goals and Risk Factors at Admission - 12/27/21 1315       Core Components/Risk Factors/Patient Goals on Admission    Weight Management Weight Maintenance    Improve shortness of breath with ADL's Yes    Intervention Provide education, individualized exercise plan and daily activity instruction to help decrease symptoms of SOB with activities of daily living.    Expected Outcomes Short Term: Improve cardiorespiratory fitness to achieve a reduction of symptoms when performing ADLs;Long Term: Be able to perform more ADLs without symptoms or delay the onset of symptoms    Increase knowledge of respiratory medications and ability to use respiratory devices properly  Yes    Intervention Provide education and demonstration as needed of appropriate use of medications, inhalers, and oxygen therapy.     Expected Outcomes Short Term: Achieves understanding of medications use. Understands that oxygen is a medication prescribed by physician. Demonstrates appropriate use of inhaler and oxygen therapy.;Long Term: Maintain appropriate use of medications, inhalers, and oxygen therapy.    Personal Goal Other Yes    Personal Goal Patient wants to be able to return to his normal strength.    Intervention Patient will attend PR 2 days/week with education and exercise.    Expected Outcomes Patient will complete the program meeting both personal and program goals.             Core Components/Risk Factors/Patient Goals Review:   Goals and Risk Factor Review     Row Name 09/19/21 1134 10/08/21 1514           Core Components/Risk Factors/Patient Goals  Review   Personal Goals Review Other Other      Review Patient was referred to PR with malignant neoplasm of Left upper lobe of lung. He has completed 3 sessions and is doing well in the program with consistent attendance. His personal goals for the program are to get stronger and prepare his lungs for surgery for cancer. He is currently receiving chemotherapy. We will continue to monitor his progress as he works towards meeting these personal goals. Patient has completed 7 sessions and is doing well in the program with consistent attendance. His personal goals for the program are to get stronger and prepare his lungs for surgery for cancer. He is currently receiving chemotherapy.  He is tolerating exercise well on the Tm and recumbent elliptical and his O2 sats averaging around 93%-96% while exercising on room air.  We will continue to encourage his progress as he continues to works towards meeting these personal goals.      Expected Outcomes Patient will complete the program meeting both personal and program goals. Patient will complete the program meeting both personal and program goals.               Core Components/Risk Factors/Patient Goals at  Discharge (Final Review):   Goals and Risk Factor Review - 10/08/21 1514       Core Components/Risk Factors/Patient Goals Review   Personal Goals Review Other    Review Patient has completed 7 sessions and is doing well in the program with consistent attendance. His personal goals for the program are to get stronger and prepare his lungs for surgery for cancer. He is currently receiving chemotherapy.  He is tolerating exercise well on the Tm and recumbent elliptical and his O2 sats averaging around 93%-96% while exercising on room air.  We will continue to encourage his progress as he continues to works towards meeting these personal goals.    Expected Outcomes Patient will complete the program meeting both personal and program goals.             ITP Comments:  ITP Comments     Row Name 11/19/21 1455           ITP Comments Pt discharged from PR on 11/2/023 after 16 sessions. He was participating in [place] to prepare his body for his lobectomy to remove cancer from his lung. He underwent lobectomy surgery on 11/13/2021 at [facility]. I explained to pt that if he would like to do PR again after he has recovered from his surgery, that he will need to get another referral from his doctor.                Comments: Patient arrived for 1st visit/orientation/education at 1230. Patient was referred to PR by Dr. 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 During orientation advised patient on arrival and appointment times what to wear, what to do before, during and after exercise. Reviewed attendance and class policy.  Pt is scheduled to return Pulmonary Rehab on 01/01/22 at 1330. Pt was advised to come to class 15 minutes before class starts.  Discussed RPE/Dpysnea scales. Patient participated in warm up stretches. Patient was able to complete 6 minute walk test.  Patient was measured for the equipment. Discussed equipment safety with patient. Took patient pre-anthropometric  measurements. Patient finished visit at 1330.

## 2022-01-01 ENCOUNTER — Encounter (HOSPITAL_COMMUNITY)
Admission: RE | Admit: 2022-01-01 | Discharge: 2022-01-01 | Disposition: A | Discharge status: Home / Self Care | Payer: Medicare Other | Source: Ambulatory Visit | Attending: Thoracic Surgery (Cardiothoracic Vascular Surgery) | Admitting: Thoracic Surgery (Cardiothoracic Vascular Surgery)

## 2022-01-01 DIAGNOSIS — C3412 Malignant neoplasm of upper lobe, left bronchus or lung: Secondary | ICD-10-CM

## 2022-01-01 DIAGNOSIS — C3411 Malignant neoplasm of upper lobe, right bronchus or lung: Secondary | ICD-10-CM | POA: Diagnosis not present

## 2022-01-01 NOTE — Progress Notes (Signed)
Daily Session Note  Patient Details  Name: Patrick Harris MRN: [id number] Date of Birth: 02/13/1945 Referring Provider:   [name] from 12/27/2021 in [place]  Referring Provider Dr. Williemae Natter       Encounter Date: 01/01/2022  Check In:  Session Check In - 01/01/22 1330       Check-In   Supervising physician immediately available to respond to emergencies [facility] MD immediately available    Physician(s) Dr. Harl Bowie    Location [facility]    Staff Present Leana Roe, BS, Exercise Physiologist;Dalton Sherrie George, MS, ACSM-CEP;Geanie Cooley, RN    Virtual Visit No    Medication changes reported     No    Fall or balance concerns reported    No    Tobacco Cessation No Change    Warm-up and Cool-down Performed as group-led instruction    Resistance Training Performed Yes    VAD Patient? No    PAD/SET Patient? No      Pain Assessment   Currently in Pain? No/denies    Pain Score 0-No pain    Multiple Pain Sites No             Capillary Blood Glucose: No results found for this or any previous visit (from the past 24 hour(s)).    Social History   Tobacco Use  Smoking Status Some Days   Types: Cigarettes   Last attempt to quit: 01/07/1998   Years since quitting: 24.0  Smokeless Tobacco Never  Tobacco Comments   Smokes "every once in a while"   07/25/21 ARJ     Goals Met:  Proper associated with RPD/PD & O2 Sat Improved SOB with ADL's Using PLB without cueing & demonstrates good technique Exercise tolerated well No report of concerns or symptoms today Strength training completed today  Goals Unmet:  Not Applicable  Comments: check out @ 2:30pm   Dr. Kathie Dike is Medical Director for [facility] Pulmonary Rehab.

## 2022-01-03 ENCOUNTER — Encounter (HOSPITAL_COMMUNITY)
Admission: RE | Admit: 2022-01-03 | Discharge: 2022-01-03 | Disposition: A | Discharge status: Home / Self Care | Payer: Medicare Other | Source: Ambulatory Visit | Attending: Thoracic Surgery (Cardiothoracic Vascular Surgery) | Admitting: Thoracic Surgery (Cardiothoracic Vascular Surgery)

## 2022-01-03 DIAGNOSIS — C3412 Malignant neoplasm of upper lobe, left bronchus or lung: Secondary | ICD-10-CM | POA: Diagnosis not present

## 2022-01-03 DIAGNOSIS — C3411 Malignant neoplasm of upper lobe, right bronchus or lung: Secondary | ICD-10-CM

## 2022-01-03 NOTE — Progress Notes (Signed)
Daily Session Note  Patient Details  Name: LADARIAN BONCZEK MRN: [id number] Date of Birth: 1945/07/18 Referring Provider:   Flowsheet Row PULMONARY REHAB OTHER RESP ORIENTATION from 12/27/2021 in [place]  Referring Provider Dr. Williemae Natter       Encounter Date: 01/03/2022  Check In:  Session Check In - 01/03/22 1327       Check-In   Supervising physician immediately available to respond to emergencies [facility] MD immediately available    Physician(s) Dr. Johnsie Cancel    Location [facility]    Staff Present Hoy Register MHA, MS, ACSM-CEP;[employer] BSN, RN;Debra Wynetta Emery, RN, BSN    Virtual Visit No    Medication changes reported     No    Fall or balance concerns reported    No    Tobacco Cessation No Change    Warm-up and Cool-down Performed as group-led Higher education careers adviser Performed Yes    VAD Patient? No    PAD/SET Patient? No      Pain Assessment   Currently in Pain? No/denies    Pain Score 0-No pain    Multiple Pain Sites No             Capillary Blood Glucose: No results found for this or any previous visit (from the past 24 hour(s)).    Social History   Tobacco Use  Smoking Status Some Days   Types: Cigarettes   Last attempt to quit: 01/07/1998   Years since quitting: 24.0  Smokeless Tobacco Never  Tobacco Comments   Smokes "every once in a while"   07/25/21 ARJ     Goals Met:  Proper associated with RPD/PD & O2 Sat Independence with exercise equipment Using PLB without cueing & demonstrates good technique Exercise tolerated well Queuing for purse lip breathing No report of concerns or symptoms today Strength training completed today  Goals Unmet:  Not Applicable  Comments: check out at 2:30   Dr. Kathie Dike is Medical Director for [facility] Pulmonary Rehab.

## 2022-01-08 ENCOUNTER — Encounter (HOSPITAL_COMMUNITY)
Admission: RE | Admit: 2022-01-08 | Discharge: 2022-01-08 | Disposition: A | Discharge status: Home / Self Care | Payer: Medicare Other | Source: Ambulatory Visit | Attending: Oncology | Admitting: Oncology

## 2022-01-08 VITALS — Wt 181.9 lb

## 2022-01-08 DIAGNOSIS — C3412 Malignant neoplasm of upper lobe, left bronchus or lung: Secondary | ICD-10-CM | POA: Diagnosis not present

## 2022-01-08 DIAGNOSIS — C3411 Malignant neoplasm of upper lobe, right bronchus or lung: Secondary | ICD-10-CM | POA: Insufficient documentation

## 2022-01-08 NOTE — Progress Notes (Signed)
Daily Session Note  Patient Details  Name: TORETTO TINGLER MRN: [id number] Date of Birth: Jun 30, 1945 Referring Provider:   [place] from 12/27/2021 in [place]  Referring Provider Dr. Williemae Natter       Encounter Date: 01/08/2022  Check In:  Session Check In - 01/08/22 1328       Check-In   Supervising physician immediately available to respond to emergencies [facility] MD immediately available    Physician(s) Dr Domenic Polite    Location [facility]    Staff Present Leana Roe, BS, Exercise Physiologist;Phyllis Billingsley, RN;Shina Wass Hassell Done, RN, BSN    Virtual Visit No    Medication changes reported     No    Fall or balance concerns reported    No    Tobacco Cessation No Change    Warm-up and Cool-down Performed as group-led instruction    Resistance Training Performed Yes    VAD Patient? No    PAD/SET Patient? No      Pain Assessment   Currently in Pain? No/denies    Pain Score 0-No pain    Multiple Pain Sites No             Capillary Blood Glucose: No results found for this or any previous visit (from the past 24 hour(s)).    Social History   Tobacco Use  Smoking Status Some Days   Types: Cigarettes   Last attempt to quit: 01/07/1998   Years since quitting: 24.0  Smokeless Tobacco Never  Tobacco Comments   Smokes "every once in a while"   07/25/21 ARJ     Goals Met:  Proper associated with RPD/PD & O2 Sat [place] with exercise equipment Using PLB without cueing & demonstrates good technique Exercise tolerated well Queuing for purse lip breathing No report of concerns or symptoms today Strength training completed today  Goals Unmet:  Not Applicable  Comments: Checkout at 1430.   Dr. Kathie Dike is Medical Director for [facility] Pulmonary Rehab.

## 2022-01-09 NOTE — Progress Notes (Signed)
Pulmonary Individual Treatment Plan  Patient Details  Name: Patrick Harris MRN: [id number] Date of Birth: 04/27/1945 Referring Provider:   [facility] from 12/27/2021 in [facility]  Referring Provider Dr. Williemae Natter       Initial Encounter Date:  Flowsheet Row PULMONARY REHAB OTHER RESP ORIENTATION from 12/27/2021 in [facility]  Date 12/27/21       Visit Diagnosis: Malignant neoplasm of right upper lobe of lung ([place])  Malignant neoplasm of upper lobe of left lung ([place])  Patient's Home Medications on Admission:   Current Outpatient Medications:    acetaminophen (TYLENOL) 500 MG tablet, Take 500 mg by mouth every 6 (six) hours as needed (for pain.)., Disp: , Rfl:    aspirin EC 81 MG tablet, Take 1 tablet (81 mg total) by mouth at bedtime. Okay to restart this medication on 07/03/2021, Disp: 30 tablet, Rfl: 11   Cholecalciferol (VITAMIN D3) 2000 UNITS TABS, Take 2,000 Units by mouth in the morning and at bedtime., Disp: , Rfl:    Coenzyme Q10 (COQ-10) 100 MG CAPS, Take 100 mg by mouth in the morning., Disp: , Rfl:    Cyanocobalamin (B-12 PO), Take 1 tablet by mouth daily., Disp: , Rfl:    hydrALAZINE (APRESOLINE) 25 MG tablet, Take 1 tablet (25 mg total) by mouth 3 (three) times daily., Disp: 270 tablet, Rfl: 3   irbesartan (AVAPRO) 300 MG tablet, Take 1 tablet (300 mg total) by mouth daily., Disp: 90 tablet, Rfl: 3   nitroGLYCERIN (NITROSTAT) 0.4 MG SL tablet, Place 1 tablet (0.4 mg total) under the tongue every 5 (five) minutes x 3 doses as needed for chest pain (if no relief after 2nd dose, proceed to ED or call 911)., Disp: 25 tablet, Rfl: 3   Omega-3 Fatty Acids (OMEGA-3 FISH OIL PO), Take 1,000 mg by mouth in the morning., Disp: , Rfl:    omeprazole (PRILOSEC) 40 MG capsule, Take 1 capsule (40 mg total) by mouth in the morning and at bedtime. (Patient taking differently: Take 40 mg by mouth in the  morning.), Disp: 60 capsule, Rfl: 8   ondansetron (ZOFRAN) 8 MG tablet, Take 8 mg by mouth every 8 (eight) hours as needed for vomiting or nausea., Disp: , Rfl:    Polyethyl Glycol-Propyl Glycol (LUBRICANT EYE DROPS) 0.4-0.3 % SOLN, Place 1-2 drops into both eyes 3 (three) times daily as needed (dry/irritated eyes.)., Disp: , Rfl:    rosuvastatin (CRESTOR) 20 MG tablet, TAKE HALF A TABLET (10 MG) BY MOUTH ONCE DAILY., Disp: 45 tablet, Rfl: 3   tadalafil (CIALIS) 5 MG tablet, Take 5 mg by mouth daily as needed for erectile dysfunction., Disp: , Rfl:    tamsulosin (FLOMAX) 0.4 MG CAPS capsule, Take 0.4 mg by mouth in the morning., Disp: , Rfl:    Testosterone 20.25 MG/ACT (1.62%) GEL, Apply 2 Pump topically in the morning., Disp: , Rfl:    vitamin C (ASCORBIC ACID) 500 MG tablet, Take 500 mg by mouth in the morning., Disp: , Rfl:   Past Medical History: Past Medical History:  Diagnosis Date   Arthritis    CAD (coronary artery disease)    CABG with LIMA to LAD and RIMA to RCA August 2000 - Dr. Roxan Hockey   Cancer [facility])    basal cell   CHF (congestive heart failure) (HCC)    COPD (chronic obstructive pulmonary disease) (HCC)    Elevated glycated hemoglobin    Essential hypertension  GERD (gastroesophageal reflux disease)    Heart murmur    Hyperlipidemia    Hypopituitarism ([place])    Lung cancer ([place])    Osteopenia    Dr Wilson Singer   Pancreatitis    Peyronie disease    Testosterone deficiency    Dr Wilson Singer    Tobacco Use: Social History   Tobacco Use  Smoking Status Some Days   Types: Cigarettes   Last attempt to quit: 01/07/1998   Years since quitting: 24.0  Smokeless Tobacco Never  Tobacco Comments   Smokes "every once in a while"   07/25/21 [facility]     Labs: Review Flowsheet  More data exists      Latest Ref Rng & Units 08/22/2017 12/23/2017 08/24/2018 08/25/2019 08/29/2020  Labs for ITP Cardiac and Pulmonary Rehab  Cholestrol 0 - 200 mg/dL 128  137  146  122  118   LDL (calc) 0 -  99 mg/dL 71  81  86  71  65   HDL-C >39.00 mg/dL 29.10  36  34.20  33  33.60   Trlycerides 0.0 - 149.0 mg/dL 140.0  101  130.0  98  96.0   Hemoglobin A1c 4.6 - 6.5 % 6.2  5.9  6.1  5.5  6.0     Capillary Blood Glucose: Lab Results  Component Value Date   GLUCAP 117 (H) 07/23/2021     Pulmonary Assessment Scores:  Pulmonary Assessment Scores     Row Name 09/05/21 1249 12/27/21 1312       ADL UCSD   ADL Phase Entry Entry    SOB Score total 16 69    Rest 0 2    Walk 2 3    Stairs 3 4    Bath 0 3    Dress 0 3    Shop 0 3      CAT Score   CAT Score 16 23      mMRC Score   mMRC Score 0 3            UCSD: Self-administered rating of dyspnea associated with activities of daily living (ADLs) 6-point scale (0 = "not at all" to 5 = "maximal or unable to do because of breathlessness")  Scoring Scores range from 0 to 120.  Minimally important difference is 5 units  CAT: CAT can identify the health impairment of COPD patients and is better correlated with disease progression.  CAT has a scoring range of zero to 40. The CAT score is classified into four groups of low (less than 10), medium (10 - 20), high (21-30) and very high (31-40) based on the impact level of disease on health status. A CAT score over 10 suggests significant symptoms.  A worsening CAT score could be explained by an exacerbation, poor medication adherence, poor inhaler technique, or progression of COPD or comorbid conditions.  CAT MCID is 2 points  mMRC: mMRC (Modified Medical Research Council) Dyspnea Scale is used to assess the degree of baseline functional disability in patients of respiratory disease due to dyspnea. No minimal important difference is established. A decrease in score of 1 point or greater is considered a positive change.   Pulmonary Function Assessment:   Exercise Target Goals: Exercise Program Goal: Individual exercise prescription set using results from initial 6 min walk test and  THRR while considering  patient's activity barriers and safety.   Exercise Prescription Goal: Initial exercise prescription builds to 30-45 minutes a day of aerobic activity, 2-3 days per week.  Home exercise guidelines will be given to patient during program as part of exercise prescription that the participant will acknowledge.  Activity Barriers & Risk Stratification:  Activity Barriers & Cardiac Risk Stratification - 12/27/21 1250       Activity Barriers & Cardiac Risk Stratification   Activity Barriers Shortness of Breath;Deconditioning;Neck/Spine Problems    Cardiac Risk Stratification High             6 Minute Walk:  6 Minute Walk     Row Name 09/05/21 1311 12/27/21 1331       6 Minute Walk   Phase Initial Initial    Distance 1400 feet 1200 feet    Walk Time 6 minutes 6 minutes    # of Rest Breaks 0 0    MPH 2.65 2.27    METS 2.92 2.88    RPE 11 12    Perceived Dyspnea  11 12    VO2 Peak 10.24 10.09    Symptoms No No    Resting HR 68 bpm 80 bpm    Resting BP 132/62 110/60    Resting Oxygen Saturation  95 % 96 %    Exercise Oxygen Saturation  during 6 min walk 94 % 93 %    Max Ex. HR 75 bpm 121 bpm    Max Ex. BP 160/60 130/60    2 Minute Post BP 130/60 120/60      Interval HR   1 Minute HR 74 114    2 Minute HR 75 105    3 Minute HR 74 110    4 Minute HR 74 105    5 Minute HR 74 104    6 Minute HR 75 121    2 Minute Post HR 65 80    Interval Heart Rate? Yes Yes      Interval Oxygen   Interval Oxygen? Yes Yes    Baseline Oxygen Saturation % 95 % 96 %    1 Minute Oxygen Saturation % 95 % 96 %    1 Minute Liters of Oxygen 0 L 0 L    2 Minute Oxygen Saturation % 95 % 94 %    2 Minute Liters of Oxygen 0 L 0 L    3 Minute Oxygen Saturation % 94 % 95 %    3 Minute Liters of Oxygen 0 L 0 L    4 Minute Oxygen Saturation % 95 % 93 %    4 Minute Liters of Oxygen 0 L 0 L    5 Minute Oxygen Saturation % 95 % 95 %    5 Minute Liters of Oxygen 0 L 0 L    6  Minute Oxygen Saturation % 95 % 94 %    6 Minute Liters of Oxygen 0 L 0 L    2 Minute Post Oxygen Saturation % 95 % 96 %    2 Minute Post Liters of Oxygen 0 L 0 L             Oxygen Initial Assessment:  Oxygen Initial Assessment - 12/27/21 1311       Home Oxygen   Home Oxygen Device None    Sleep Oxygen Prescription None    Home Exercise Oxygen Prescription None    Home Resting Oxygen Prescription None      Initial 6 min Walk   Oxygen Used None      Program Oxygen Prescription   Program Oxygen Prescription None      Intervention  Short Term Goals To learn and understand importance of monitoring SPO2 with pulse oximeter and demonstrate accurate use of the pulse oximeter.;To learn and understand importance of maintaining oxygen saturations>88%;To learn and demonstrate proper pursed lip breathing techniques or other breathing techniques.     Long  Term Goals Verbalizes importance of monitoring SPO2 with pulse oximeter and return demonstration;Maintenance of O2 saturations>88%;Exhibits proper breathing techniques, such as pursed lip breathing or other method taught during program session             Oxygen Re-Evaluation:  Oxygen Re-Evaluation     Row Name 09/18/21 1428 10/16/21 1359 01/08/22 1604         Program Oxygen Prescription   Program Oxygen Prescription None None None       Home Oxygen   Home Oxygen Device None None None     Sleep Oxygen Prescription None None None     Home Exercise Oxygen Prescription None None None     Home Resting Oxygen Prescription None None None     Compliance with Home Oxygen Use Yes Yes Yes       Goals/Expected Outcomes   Short Term Goals To learn and understand importance of maintaining oxygen saturations>88%;To learn and demonstrate proper use of respiratory medications;To learn and understand importance of monitoring SPO2 with pulse oximeter and demonstrate accurate use of the pulse oximeter. To learn and understand importance of  maintaining oxygen saturations>88%;To learn and demonstrate proper use of respiratory medications;To learn and understand importance of monitoring SPO2 with pulse oximeter and demonstrate accurate use of the pulse oximeter. To learn and understand importance of monitoring SPO2 with pulse oximeter and demonstrate accurate use of the pulse oximeter.;To learn and understand importance of maintaining oxygen saturations>88%;To learn and demonstrate proper pursed lip breathing techniques or other breathing techniques.      Long  Term Goals -- Verbalizes importance of monitoring SPO2 with pulse oximeter and return demonstration;Maintenance of O2 saturations>88%;Exhibits proper breathing techniques, such as pursed lip breathing or other method taught during program session;Compliance with respiratory medication Verbalizes importance of monitoring SPO2 with pulse oximeter and return demonstration;Maintenance of O2 saturations>88%;Exhibits proper breathing techniques, such as pursed lip breathing or other method taught during program session     Goals/Expected Outcomes compliance compliance compliance              Oxygen Discharge (Final Oxygen Re-Evaluation):  Oxygen Re-Evaluation - 01/08/22 1604       Program Oxygen Prescription   Program Oxygen Prescription None      Home Oxygen   Home Oxygen Device None    Sleep Oxygen Prescription None    Home Exercise Oxygen Prescription None    Home Resting Oxygen Prescription None    Compliance with Home Oxygen Use Yes      Goals/Expected Outcomes   Short Term Goals To learn and understand importance of monitoring SPO2 with pulse oximeter and demonstrate accurate use of the pulse oximeter.;To learn and understand importance of maintaining oxygen saturations>88%;To learn and demonstrate proper pursed lip breathing techniques or other breathing techniques.     Long  Term Goals Verbalizes importance of monitoring SPO2 with pulse oximeter and return  demonstration;Maintenance of O2 saturations>88%;Exhibits proper breathing techniques, such as pursed lip breathing or other method taught during program session    Goals/Expected Outcomes compliance             Initial Exercise Prescription:  Initial Exercise Prescription - 12/27/21 1300       Date of Initial  Exercise RX and Referring Provider   Date 12/27/21    Referring Provider Dr. Williemae Natter    Expected Discharge Date 04/30/22      Treadmill   MPH 1.5    Grade 0    Minutes 17      Recumbant Elliptical   Level 1    RPM 45    Minutes 22      Prescription Details   Frequency (times per week) 2    Duration Progress to 30 minutes of continuous aerobic without signs/symptoms of physical distress      Intensity   THRR 40-80% of Max Heartrate 58-115    Ratings of Perceived Exertion 11-13    Perceived Dyspnea 0-4      Resistance Training   Training Prescription Yes    Weight 4    Reps 10-15             Perform Capillary Blood Glucose checks as needed.  Exercise Prescription Changes:   Exercise Prescription Changes     Row Name 09/18/21 1400 10/04/21 1400 10/11/21 1400 10/30/21 1400 01/08/22 1500     Response to Exercise   Blood Pressure (Admit) 140/64 128/60 120/60 114/60 112/62   Blood Pressure (Exercise) 128/62 160/72 150/60 124/60 130/62   Blood Pressure (Exit) 120/62 130/60 120/60 122/64 122/58   Heart Rate (Admit) 68 bpm 82 bpm 78 bpm 82 bpm 77 bpm   Heart Rate (Exercise) 92 bpm 90 bpm 85 bpm 90 bpm 92 bpm   Heart Rate (Exit) 78 bpm 79 bpm 86 bpm 90 bpm 80 bpm   Oxygen Saturation (Admit) 93 % 94 % 96 % 96 % 95 %   Oxygen Saturation (Exercise) 93 % 93 % 94 % 95 % 93 %   Oxygen Saturation (Exit) 93 % 95 % 94 % 94 % 95 %   Rating of Perceived Exertion (Exercise) 11 12 12 12 12    Perceived Dyspnea (Exercise) 11 12 12 12 12    Duration Continue with 30 min of aerobic exercise without signs/symptoms of physical distress. Continue with 30 min of aerobic exercise  without signs/symptoms of physical distress. Continue with 30 min of aerobic exercise without signs/symptoms of physical distress. Continue with 30 min of aerobic exercise without signs/symptoms of physical distress. Continue with 30 min of aerobic exercise without signs/symptoms of physical distress.   Intensity THRR unchanged THRR unchanged THRR unchanged THRR unchanged THRR unchanged     Progression   Progression Continue to progress workloads to maintain intensity without signs/symptoms of physical distress. Continue to progress workloads to maintain intensity without signs/symptoms of physical distress. Continue to progress workloads to maintain intensity without signs/symptoms of physical distress. Continue to progress workloads to maintain intensity without signs/symptoms of physical distress. Continue to progress workloads to maintain intensity without signs/symptoms of physical distress.     Resistance Training   Training Prescription Yes Yes Yes Yes Yes   Weight 4 4 4 4 4    Reps 10-15 10-15 10-15 10-15 10-15   Time 10 Minutes 10 Minutes 10 Minutes 10 Minutes 10 Minutes     Treadmill   MPH 1.8 2.5 2.5 2.4 1.8   Grade 0 0 0 0 0   Minutes 17 17 17 17 17    METs 2.38 2.91 2.91 2.87 2.38     Recumbant Elliptical   Level 1 2 1 1 1    RPM 53 57 54 44 34   Minutes 22 22 22 22 22    METs 3.3 3.8 3 2.3  2.1            Exercise Comments:   Exercise Goals and Review:   Exercise Goals     Row Name 09/05/21 1315 09/18/21 1425 10/16/21 1355 12/27/21 1334 01/08/22 1601     Exercise Goals   Increase Physical Activity Yes Yes Yes Yes Yes   Intervention Provide advice, education, support and counseling about physical activity/exercise needs.;Develop an individualized exercise prescription for aerobic and resistive training based on initial evaluation findings, risk stratification, comorbidities and participant's personal goals. Provide advice, education, support and counseling about  physical activity/exercise needs.;Develop an individualized exercise prescription for aerobic and resistive training based on initial evaluation findings, risk stratification, comorbidities and participant's personal goals. Provide advice, education, support and counseling about physical activity/exercise needs.;Develop an individualized exercise prescription for aerobic and resistive training based on initial evaluation findings, risk stratification, comorbidities and participant's personal goals. Provide advice, education, support and counseling about physical activity/exercise needs.;Develop an individualized exercise prescription for aerobic and resistive training based on initial evaluation findings, risk stratification, comorbidities and participant's personal goals. Provide advice, education, support and counseling about physical activity/exercise needs.;Develop an individualized exercise prescription for aerobic and resistive training based on initial evaluation findings, risk stratification, comorbidities and participant's personal goals.   Expected Outcomes Short Term: Attend rehab on a regular basis to increase amount of physical activity.;Long Term: Add in home exercise to make exercise part of routine and to increase amount of physical activity.;Long Term: Exercising regularly at least 3-5 days a week. Short Term: Attend rehab on a regular basis to increase amount of physical activity.;Long Term: Add in home exercise to make exercise part of routine and to increase amount of physical activity.;Long Term: Exercising regularly at least 3-5 days a week. Short Term: Attend rehab on a regular basis to increase amount of physical activity.;Long Term: Add in home exercise to make exercise part of routine and to increase amount of physical activity.;Long Term: Exercising regularly at least 3-5 days a week. Short Term: Attend rehab on a regular basis to increase amount of physical activity.;Long Term: Add in  home exercise to make exercise part of routine and to increase amount of physical activity.;Long Term: Exercising regularly at least 3-5 days a week. Short Term: Attend rehab on a regular basis to increase amount of physical activity.;Long Term: Add in home exercise to make exercise part of routine and to increase amount of physical activity.;Long Term: Exercising regularly at least 3-5 days a week.   Increase Strength and Stamina Yes Yes Yes Yes Yes   Intervention Provide advice, education, support and counseling about physical activity/exercise needs.;Develop an individualized exercise prescription for aerobic and resistive training based on initial evaluation findings, risk stratification, comorbidities and participant's personal goals. Provide advice, education, support and counseling about physical activity/exercise needs.;Develop an individualized exercise prescription for aerobic and resistive training based on initial evaluation findings, risk stratification, comorbidities and participant's personal goals. Provide advice, education, support and counseling about physical activity/exercise needs.;Develop an individualized exercise prescription for aerobic and resistive training based on initial evaluation findings, risk stratification, comorbidities and participant's personal goals. Provide advice, education, support and counseling about physical activity/exercise needs.;Develop an individualized exercise prescription for aerobic and resistive training based on initial evaluation findings, risk stratification, comorbidities and participant's personal goals. Provide advice, education, support and counseling about physical activity/exercise needs.;Develop an individualized exercise prescription for aerobic and resistive training based on initial evaluation findings, risk stratification, comorbidities and participant's personal goals.   Expected Outcomes Short Term:  Increase workloads from initial exercise  prescription for resistance, speed, and METs.;Short Term: Perform resistance training exercises routinely during rehab and add in resistance training at home;Long Term: Improve cardiorespiratory fitness, muscular endurance and strength as measured by increased METs and functional capacity (6MWT) Short Term: Increase workloads from initial exercise prescription for resistance, speed, and METs.;Short Term: Perform resistance training exercises routinely during rehab and add in resistance training at home;Long Term: Improve cardiorespiratory fitness, muscular endurance and strength as measured by increased METs and functional capacity (6MWT) Short Term: Increase workloads from initial exercise prescription for resistance, speed, and METs.;Short Term: Perform resistance training exercises routinely during rehab and add in resistance training at home;Long Term: Improve cardiorespiratory fitness, muscular endurance and strength as measured by increased METs and functional capacity (6MWT) Short Term: Increase workloads from initial exercise prescription for resistance, speed, and METs.;Short Term: Perform resistance training exercises routinely during rehab and add in resistance training at home;Long Term: Improve cardiorespiratory fitness, muscular endurance and strength as measured by increased METs and functional capacity (6MWT) Short Term: Increase workloads from initial exercise prescription for resistance, speed, and METs.;Short Term: Perform resistance training exercises routinely during rehab and add in resistance training at home;Long Term: Improve cardiorespiratory fitness, muscular endurance and strength as measured by increased METs and functional capacity (6MWT)   Able to understand and use rate of perceived exertion (RPE) scale Yes Yes Yes Yes Yes   Intervention Provide education and explanation on how to use RPE scale Provide education and explanation on how to use RPE scale Provide education and  explanation on how to use RPE scale Provide education and explanation on how to use RPE scale Provide education and explanation on how to use RPE scale   Expected Outcomes Short Term: Able to use RPE daily in rehab to express subjective intensity level;Long Term:  Able to use RPE to guide intensity level when exercising independently Short Term: Able to use RPE daily in rehab to express subjective intensity level;Long Term:  Able to use RPE to guide intensity level when exercising independently -- Short Term: Able to use RPE daily in rehab to express subjective intensity level;Long Term:  Able to use RPE to guide intensity level when exercising independently Short Term: Able to use RPE daily in rehab to express subjective intensity level;Long Term:  Able to use RPE to guide intensity level when exercising independently   Able to understand and use Dyspnea scale Yes Yes Yes Yes Yes   Intervention Provide education and explanation on how to use Dyspnea scale Provide education and explanation on how to use Dyspnea scale Provide education and explanation on how to use Dyspnea scale Provide education and explanation on how to use Dyspnea scale Provide education and explanation on how to use Dyspnea scale   Expected Outcomes Short Term: Able to use Dyspnea scale daily in rehab to express subjective sense of shortness of breath during exertion;Long Term: Able to use Dyspnea scale to guide intensity level when exercising independently Short Term: Able to use Dyspnea scale daily in rehab to express subjective sense of shortness of breath during exertion;Long Term: Able to use Dyspnea scale to guide intensity level when exercising independently Short Term: Able to use Dyspnea scale daily in rehab to express subjective sense of shortness of breath during exertion;Long Term: Able to use Dyspnea scale to guide intensity level when exercising independently Short Term: Able to use Dyspnea scale daily in rehab to express  subjective sense of shortness of breath during exertion;Long  Term: Able to use Dyspnea scale to guide intensity level when exercising independently Short Term: Able to use Dyspnea scale daily in rehab to express subjective sense of shortness of breath during exertion;Long Term: Able to use Dyspnea scale to guide intensity level when exercising independently   Knowledge and understanding of Target Heart Rate Range (THRR) Yes Yes Yes Yes Yes   Intervention Provide education and explanation of THRR including how the numbers were predicted and where they are located for reference Provide education and explanation of THRR including how the numbers were predicted and where they are located for reference Provide education and explanation of THRR including how the numbers were predicted and where they are located for reference Provide education and explanation of THRR including how the numbers were predicted and where they are located for reference Provide education and explanation of THRR including how the numbers were predicted and where they are located for reference   Expected Outcomes Short Term: Able to state/look up THRR;Short Term: Able to use daily as guideline for intensity in rehab;Long Term: Able to use THRR to govern intensity when exercising independently Short Term: Able to state/look up THRR;Short Term: Able to use daily as guideline for intensity in rehab;Long Term: Able to use THRR to govern intensity when exercising independently Short Term: Able to state/look up THRR;Short Term: Able to use daily as guideline for intensity in rehab;Long Term: Able to use THRR to govern intensity when exercising independently Short Term: Able to state/look up THRR;Short Term: Able to use daily as guideline for intensity in rehab;Long Term: Able to use THRR to govern intensity when exercising independently Short Term: Able to state/look up THRR;Short Term: Able to use daily as guideline for intensity in rehab;Long Term:  Able to use THRR to govern intensity when exercising independently   Understanding of Exercise Prescription Yes Yes Yes Yes Yes   Intervention Provide education, explanation, and written materials on patient's individual exercise prescription Provide education, explanation, and written materials on patient's individual exercise prescription Provide education, explanation, and written materials on patient's individual exercise prescription Provide education, explanation, and written materials on patient's individual exercise prescription Provide education, explanation, and written materials on patient's individual exercise prescription   Expected Outcomes Short Term: Able to explain program exercise prescription;Long Term: Able to explain home exercise prescription to exercise independently Short Term: Able to explain program exercise prescription;Long Term: Able to explain home exercise prescription to exercise independently Short Term: Able to explain program exercise prescription;Long Term: Able to explain home exercise prescription to exercise independently Short Term: Able to explain program exercise prescription;Long Term: Able to explain home exercise prescription to exercise independently Short Term: Able to explain program exercise prescription;Long Term: Able to explain home exercise prescription to exercise independently            Exercise Goals Re-Evaluation :  Exercise Goals Re-Evaluation     Row Name 09/18/21 1425 10/16/21 1355 01/08/22 1601         Exercise Goal Re-Evaluation   Exercise Goals Review Increase Physical Activity;Increase Strength and Stamina;Able to understand and use rate of perceived exertion (RPE) scale;Able to understand and use Dyspnea scale;Knowledge and understanding of Target Heart Rate Range (THRR);Understanding of Exercise Prescription Increase Strength and Stamina;Increase Physical Activity;Able to understand and use rate of perceived exertion (RPE)  scale;Able to understand and use Dyspnea scale;Knowledge and understanding of Target Heart Rate Range (THRR);Understanding of Exercise Prescription Increase Physical Activity;Increase Strength and Stamina;Able to understand and use rate of perceived  exertion (RPE) scale;Able to understand and use Dyspnea scale;Knowledge and understanding of Target Heart Rate Range (THRR);Understanding of Exercise Prescription     Comments Pt has completed 2 sessions of PR. He seems to enjoy coming to class so far and is eager to get going. He is increasing his speed on the treadmill and RPM on the ellp. He has a bike at home that he rides for 20 minutes a day along with doing weights and bands. He is currenlty exercising at 3.3 METs on the ellp. Will continue to monitor and progress as able. Pt has completed 9 sessions of PR. 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 He is increasing his speed on the treadmill and RPM on the ellp. He has been tired recently due to [place] but pushes through. He is still exercising at home on days off from class and treatments. He is currently exercising at 3.0 METs on the ellp. Will continue to monitor and progress as able. Pt has completed 3 session of PR. He was ready to get back to class after his surgery. He is progressing each class with his levels. He is currently exercising at 2.38 METs on the treadmill. Will continue to monitor and progress as able.     Expected Outcomes Through exercise at home and at rehab, the patient will meet their stated goals. Through exercise at home and at rehab, the patient will meet their stated goals. Through exercise at home and at rehab, the patient will meet their stated goals.              Discharge Exercise Prescription (Final Exercise Prescription Changes):  Exercise Prescription Changes - 01/08/22 1500       Response to Exercise   Blood Pressure (Admit) 112/62    Blood Pressure (Exercise) 130/62    Blood Pressure (Exit) 122/58    Heart  Rate (Admit) 77 bpm    Heart Rate (Exercise) 92 bpm    Heart Rate (Exit) 80 bpm    Oxygen Saturation (Admit) 95 %    Oxygen Saturation (Exercise) 93 %    Oxygen Saturation (Exit) 95 %    Rating of Perceived Exertion (Exercise) 12    Perceived Dyspnea (Exercise) 12    Duration Continue with 30 min of aerobic exercise without signs/symptoms of physical distress.    Intensity THRR unchanged      Progression   Progression Continue to progress workloads to maintain intensity without signs/symptoms of physical distress.      Resistance Training   Training Prescription Yes    Weight 4    Reps 10-15    Time 10 Minutes      Treadmill   MPH 1.8    Grade 0    Minutes 17    METs 2.38      Recumbant Elliptical   Level 1    RPM 34    Minutes 22    METs 2.1             Nutrition:  Target Goals: Understanding of nutrition guidelines, daily intake of sodium 1500mg , cholesterol 200mg , calories 30% from fat and 7% or less from saturated fats, daily to have 5 or more servings of fruits and vegetables.  Biometrics:  Pre Biometrics - 12/27/21 1335       Pre Biometrics   Height 6' (1.829 m)    Weight 81.5 kg    Waist Circumference 40 inches    Hip Circumference 39 inches    Waist to Hip Ratio  1.03 %    BMI (Calculated) 24.36    Triceps Skinfold 10 mm    % Body Fat 24.7 %    Grip Strength 28.6 kg    Flexibility 0 in    Single Leg Stand 0 seconds              Nutrition Therapy Plan and Nutrition Goals:  Nutrition Therapy & Goals - 12/27/21 1314       Personal Nutrition Goals   Comments Patient scored 43 on his diet assessment. Handout provided and explained regarding healthier choices. We offer 2 educational sessions on heart healthy nutrition and assistance with RD referral if patient is interested.      Intervention Plan   Intervention Nutrition handout(s) given to patient.    Expected Outcomes Short Term Goal: Understand basic principles of dietary content, such  as calories, fat, sodium, cholesterol and nutrients.             Nutrition Assessments:  Nutrition Assessments - 12/27/21 1314       MEDFICTS Scores   Pre Score 43            MEDIFICTS Score Key: ?70 Need to make dietary changes  40-70 Heart Healthy Diet ? 40 Therapeutic Level Cholesterol Diet   Picture Your Plate Scores: <62 Unhealthy dietary pattern with much room for improvement. 41-50 Dietary pattern unlikely to meet recommendations for good health and room for improvement. 51-60 More healthful dietary pattern, with some room for improvement.  >60 Healthy dietary pattern, although there may be some specific behaviors that could be improved.    Nutrition Goals Re-Evaluation:   Nutrition Goals Discharge (Final Nutrition Goals Re-Evaluation):   Psychosocial: Target Goals: Acknowledge presence or absence of significant depression and/or stress, maximize coping skills, provide positive support system. Participant is able to verbalize types and ability to use techniques and skills needed for reducing stress and depression.  Initial Review & Psychosocial Screening:  Initial Psych Review & Screening - 12/27/21 1317       Initial Review   Current issues with None Identified      Family Dynamics   Good Support System? Yes      Barriers   Psychosocial barriers to participate in program There are no identifiable barriers or psychosocial needs.      Screening Interventions   Interventions Encouraged to exercise;Provide feedback about the scores to participant    Expected Outcomes Short Term goal: Identification and review with participant of any Quality of Life or Depression concerns found by scoring the questionnaire.             Quality of Life Scores:  Quality of Life - 12/27/21 1338       Quality of Life   Select Quality of Life      Quality of Life Scores   Health/Function Pre 27.48 %    Socioeconomic Pre 24.81 %    Psych/Spiritual Pre 30 %     Family Pre 30 %    GLOBAL Pre 30 %            Scores of 19 and below usually indicate a poorer quality of life in these areas.  A difference of  2-3 points is a clinically meaningful difference.  A difference of 2-3 points in the total score of the Quality of Life Index has been associated with significant improvement in overall quality of life, self-image, physical symptoms, and general health in studies assessing change in quality of life.  PHQ-9: Review Flowsheet  More data exists      12/27/2021 09/24/2021 09/05/2021 08/29/2021 09/18/2020  Depression screen PHQ 2/9  Decreased Interest 0 0 0 0 0  Down, Depressed, Hopeless 0 0 0 0 0  PHQ - 2 Score 0 0 0 0 0  Altered sleeping 0 0 0 0 -  Tired, decreased energy 0 0 0 0 -  Change in appetite 0 0 0 0 -  Feeling bad or failure about yourself  0 0 0 0 -  Trouble concentrating 0 0 0 0 -  Moving slowly or fidgety/restless 0 0 0 0 -  Suicidal thoughts 0 0 0 0 -  PHQ-9 Score 0 0 0 0 -  Difficult doing work/chores - Not difficult at all - Not difficult at all -   Interpretation of Total Score  Total Score Depression Severity:  1-4 = Minimal depression, 5-9 = Mild depression, 10-14 = Moderate depression, 15-19 = Moderately severe depression, 20-27 = Severe depression   Psychosocial Evaluation and Intervention:  Psychosocial Evaluation - 12/27/21 1318       Psychosocial Evaluation & Interventions   Interventions Encouraged to exercise with the program and follow exercise prescription;Stress management education;Relaxation education    Comments Patient has no psychosocial barriers identified to participate in PR at his orientation visit. His PHQ-9 score was 0.  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 He has cancer in his left lung and 18 out of 20 lymph nodes taken during the surgery were postive for cancer. Patient says he was told his case is complicated and they have not  decided how to treat the remaining cancer moving forward. He says they have been talking about immunotherapy but no decision has been made. He says he is more SOB now than before his surgery. He hopes this will improve. He has a good support system with his wife. He is ready to start the program hoping to get back to his normal strength level.    Expected Outcomes Pt will continue to have no identifiable psychosocial issues.    Continue Psychosocial Services  No Follow up required             Psychosocial Re-Evaluation:  Psychosocial Re-Evaluation     [place] Name 09/19/21 1133 10/08/21 1507 01/04/22 0844         Psychosocial Re-Evaluation   Current issues with None Identified None Identified None Identified     Comments Patient is new to the program completing 3 sessions. He continues to have no psychosocial barriers or issues identified. We will continue to monitor. Patient has completed 7 sessions.  He continues to have no psychosocial barriers or issues identified.  He seems to enjoy coming to class and very interactiver with class and staff.  He demonstrates an interest in improving his health.   We will continue to monitor as he progresses in the program. Patient has started PR over, due to having surgery.  He has attended 2 sessions.  He was referred via Dr. Williemae Natter with Lung cancer.  He continues to have no psychosocial barriers or issues identified.  He seems to enjoy coming to class and demonstrates a very positive outlook with class and staff.  He demonstrates an interest in improving his health.   We will continue to monitor as he progresses in the program.     Expected Outcomes Patient will continue to have no psychosocial barriers or issues  identified. Patient will continue to have no psychosocial barriers or issues identified. Patient will continue to have no psychosocial barriers or issues identified.     Interventions Encouraged to attend Pulmonary Rehabilitation for the  exercise;Relaxation education;Encouraged to attend Cardiac Rehabilitation for the exercise Encouraged to attend Pulmonary Rehabilitation for the exercise;Relaxation education;Encouraged to attend Cardiac Rehabilitation for the exercise Encouraged to attend Pulmonary Rehabilitation for the exercise;Relaxation education;Encouraged to attend Cardiac Rehabilitation for the exercise     Continue Psychosocial Services  No Follow up required No Follow up required No Follow up required              Psychosocial Discharge (Final Psychosocial Re-Evaluation):  Psychosocial Re-Evaluation - 01/04/22 0844       Psychosocial Re-Evaluation   Current issues with None Identified    Comments Patient has started PR over, due to having surgery.  He has attended 2 sessions.  He was referred via Dr. Williemae Natter with Lung cancer.  He continues to have no psychosocial barriers or issues identified.  He seems to enjoy coming to class and demonstrates a very positive outlook with class and staff.  He demonstrates an interest in improving his health.   We will continue to monitor as he progresses in the program.    Expected Outcomes Patient will continue to have no psychosocial barriers or issues identified.    Interventions Encouraged to attend Pulmonary Rehabilitation for the exercise;Relaxation education;Encouraged to attend Cardiac Rehabilitation for the exercise    Continue Psychosocial Services  No Follow up required              Education: Education Goals: Education classes will be provided on a weekly basis, covering required topics. Participant will state understanding/return demonstration of topics presented.  Learning Barriers/Preferences:  Learning Barriers/Preferences - 12/27/21 1315       Learning Barriers/Preferences   Learning Barriers None    Learning Preferences Audio;Written Material             Education Topics: How Lungs Work and Diseases: - Discuss the anatomy of the lungs and  diseases that can affect the lungs, such as COPD. Flowsheet Row PULMONARY REHAB OTHER RESPIRATORY from 01/03/2022 in [place]  Date 10/11/21  Educator DF  Instruction Review Code 2- Demonstrated Understanding       Exercise: -Discuss the importance of exercise, FITT principles of exercise, normal and abnormal responses to exercise, and how to exercise safely.   Environmental Irritants: -Discuss types of environmental irritants and how to limit exposure to environmental irritants. Flowsheet Row PULMONARY REHAB OTHER RESPIRATORY from 01/03/2022 in [place]  Date 10/18/21  Instruction Review Code 1- Verbalizes Understanding       Meds/Inhalers and oxygen: - Discuss respiratory medications, definition of an inhaler and oxygen, and the proper way to use an inhaler and oxygen. Flowsheet Row PULMONARY REHAB OTHER RESPIRATORY from 01/03/2022 in [place]  Date 10/25/21  Educator HJ       Energy Saving Techniques: - Discuss methods to conserve energy and decrease shortness of breath when performing activities of daily living.  Flowsheet Row PULMONARY REHAB OTHER RESPIRATORY from 01/03/2022 in [place]  Date 11/01/21  Educator HB  Instruction Review Code 1- Verbalizes Understanding       Bronchial Hygiene / Breathing Techniques: - Discuss breathing mechanics, pursed-lip breathing technique,  proper posture, effective ways to clear airways, and other functional breathing techniques Flowsheet Row PULMONARY REHAB OTHER RESPIRATORY from 01/03/2022 in [place]  [place]  Date 11/08/21  Educator HB  Instruction Review Code 1- Research scientist (medical): - Provides group verbal and written instruction about the health risks of elevated stress, cause of high stress, and healthy ways to reduce stress.   Nutrition I: Fats: - Discuss the types of  cholesterol, what cholesterol does to the body, and how cholesterol levels can be controlled.   Nutrition II: Labels: -Discuss the different components of food labels and how to read food labels.   Respiratory Infections: - Discuss the signs and symptoms of respiratory infections, ways to prevent respiratory infections, and the importance of seeking medical treatment when having a respiratory infection. Flowsheet Row PULMONARY REHAB OTHER RESPIRATORY from 01/03/2022 in [place]  Date 09/13/21  Educator DM  Instruction Review Code 1- Verbalizes Understanding       Stress I: Signs and Symptoms: - Discuss the causes of stress, how stress may lead to anxiety and depression, and ways to limit stress. Flowsheet Row PULMONARY REHAB OTHER RESPIRATORY from 01/03/2022 in [place]  Date 09/20/21  Educator [place]  Instruction Review Code 1- Verbalizes Understanding       Stress II: Relaxation: -Discuss relaxation techniques to limit stress. Flowsheet Row PULMONARY REHAB OTHER RESPIRATORY from 01/03/2022 in [place]  Date 09/27/21  Educator pb  Instruction Review Code 1- Verbalizes Understanding       Oxygen for Home/Travel: - Discuss how to prepare for travel when on oxygen and proper ways to transport and store oxygen to ensure safety. Flowsheet Row PULMONARY REHAB OTHER RESPIRATORY from 01/03/2022 in [place]  Date 10/04/21  Educator DF  Instruction Review Code 2- Demonstrated Understanding       Knowledge Questionnaire Score:  Knowledge Questionnaire Score - 12/27/21 1313       Knowledge Questionnaire Score   Pre Score 15/18             Core Components/Risk Factors/Patient Goals at Admission:  Personal Goals and Risk Factors at Admission - 12/27/21 1315       Core Components/Risk Factors/Patient Goals on Admission    Weight Management Weight Maintenance    Improve  shortness of breath with ADL's Yes    Intervention Provide education, individualized exercise plan and daily activity instruction to help decrease symptoms of SOB with activities of daily living.    Expected Outcomes Short Term: Improve cardiorespiratory fitness to achieve a reduction of symptoms when performing ADLs;Long Term: Be able to perform more ADLs without symptoms or delay the onset of symptoms    Increase knowledge of respiratory medications and ability to use respiratory devices properly  Yes    Intervention Provide education and demonstration as needed of appropriate use of medications, inhalers, and oxygen therapy.    Expected Outcomes Short Term: Achieves understanding of medications use. Understands that oxygen is a medication prescribed by physician. Demonstrates appropriate use of inhaler and oxygen therapy.;Long Term: Maintain appropriate use of medications, inhalers, and oxygen therapy.    Personal Goal Other Yes    Personal Goal Patient wants to be able to return to his normal strength.    Intervention Patient will attend PR 2 days/week with education and exercise.    Expected Outcomes Patient will complete the program meeting both personal and program goals.             Core Components/Risk Factors/Patient Goals Review:   Goals and Risk Factor Review  [place] Name 09/19/21 1134 10/08/21 1514 01/04/22 0853         Core Components/Risk Factors/Patient Goals Review   Personal Goals Review Other Other Other     Review Patient was referred to PR with malignant neoplasm of Left upper lobe of lung. He has completed 3 sessions and is doing well in the program with consistent attendance. His personal goals for the program are to get stronger and prepare his lungs for surgery for cancer. He is currently receiving chemotherapy. We will continue to monitor his progress as he works towards meeting these personal goals. Patient has completed 7 sessions and is doing well in the program  with consistent attendance. His personal goals for the program are to get stronger and prepare his lungs for surgery for cancer. He is currently receiving chemotherapy.  He is tolerating exercise well on the Tm and recumbent elliptical and his O2 sats averaging around 93%-96% while exercising on room air.  We will continue to encourage his progress as he continues to works towards meeting these personal goals. Patient has started PR over, due to surgery.  He attended back in October and restarting over.  He has attended 2 sessions and is doing well in the program.  His personal goals for the program are to get stronger and get back to his normal strength level.  He is tolerating exercise well on the Tm and recumbent elliptical and his O2 sats averaging around 94%-95% while exercising on room air.  We will continue to encourage his progress as he continues to works towards meeting these personal goals.     Expected Outcomes Patient will complete the program meeting both personal and program goals. Patient will complete the program meeting both personal and program goals. Patient will complete the program meeting both personal and program goals.              Core Components/Risk Factors/Patient Goals at Discharge (Final Review):   Goals and Risk Factor Review - 01/04/22 0853       Core Components/Risk Factors/Patient Goals Review   Personal Goals Review Other    Review Patient has started PR over, due to surgery.  He attended back in October and restarting over.  He has attended 2 sessions and is doing well in the program.  His personal goals for the program are to get stronger and get back to his normal strength level.  He is tolerating exercise well on the Tm and recumbent elliptical and his O2 sats averaging around 94%-95% while exercising on room air.  We will continue to encourage his progress as he continues to works towards meeting these personal goals.    Expected Outcomes Patient will complete  the program meeting both personal and program goals.             ITP Comments:  ITP Comments     Row Name 11/19/21 1455           ITP Comments Pt discharged from PR on 11/2/023 after 16 sessions. He was participating in [place] to prepare his body for his lobectomy to remove cancer from his lung. He underwent lobectomy surgery on 11/13/2021 at [facility]. I explained to pt that if he would like to do PR again after he has recovered from his surgery, that he will need to get another referral from his doctor.                Comments: ITP REVIEW Pt is making expected progress toward pulmonary  rehab goals after completing 4 sessions. Recommend continued exercise, life style modification, education, and utilization of breathing techniques to increase stamina and strength and decrease shortness of breath with exertion.

## 2022-01-09 NOTE — Progress Notes (Unsigned)
    Subjective:    Patient ID: Patrick Harris, male    DOB: [birth year], 77 y.o.   MRN: [id number]      HPI Patrick Harris is here for No chief complaint on file.    ENT referral    Medications and allergies reviewed with patient and updated if appropriate.  Current Outpatient Medications on File Prior to Visit  Medication Sig Dispense Refill   acetaminophen (TYLENOL) 500 MG tablet Take 500 mg by mouth every 6 (six) hours as needed (for pain.).     aspirin EC 81 MG tablet Take 1 tablet (81 mg total) by mouth at bedtime. Okay to restart this medication on 07/03/2021 30 tablet 11   Cholecalciferol (VITAMIN D3) 2000 UNITS TABS Take 2,000 Units by mouth in the morning and at bedtime.     Coenzyme Q10 (COQ-10) 100 MG CAPS Take 100 mg by mouth in the morning.     Cyanocobalamin (B-12 PO) Take 1 tablet by mouth daily.     hydrALAZINE (APRESOLINE) 25 MG tablet Take 1 tablet (25 mg total) by mouth 3 (three) times daily. 270 tablet 3   irbesartan (AVAPRO) 300 MG tablet Take 1 tablet (300 mg total) by mouth daily. 90 tablet 3   nitroGLYCERIN (NITROSTAT) 0.4 MG SL tablet Place 1 tablet (0.4 mg total) under the tongue every 5 (five) minutes x 3 doses as needed for chest pain (if no relief after 2nd dose, proceed to ED or call 911). 25 tablet 3   Omega-3 Fatty Acids (OMEGA-3 FISH OIL PO) Take 1,000 mg by mouth in the morning.     omeprazole (PRILOSEC) 40 MG capsule Take 1 capsule (40 mg total) by mouth in the morning and at bedtime. (Patient taking differently: Take 40 mg by mouth in the morning.) 60 capsule 8   ondansetron (ZOFRAN) 8 MG tablet Take 8 mg by mouth every 8 (eight) hours as needed for vomiting or nausea.     Polyethyl Glycol-Propyl Glycol (LUBRICANT EYE DROPS) 0.4-0.3 % SOLN Place 1-2 drops into both eyes 3 (three) times daily as needed (dry/irritated eyes.).     rosuvastatin (CRESTOR) 20 MG tablet TAKE HALF A TABLET (10 MG) BY MOUTH ONCE DAILY. 45 tablet 3   tadalafil (CIALIS) 5 MG tablet  Take 5 mg by mouth daily as needed for erectile dysfunction.     tamsulosin (FLOMAX) 0.4 MG CAPS capsule Take 0.4 mg by mouth in the morning.     Testosterone 20.25 MG/ACT (1.62%) GEL Apply 2 Pump topically in the morning.     vitamin C (ASCORBIC ACID) 500 MG tablet Take 500 mg by mouth in the morning.     No current facility-administered medications on file prior to visit.    Review of Systems     Objective:  There were no vitals filed for this visit. BP Readings from Last 3 Encounters:  12/27/21 110/60  10/01/21 (!) 160/83  09/19/21 124/70   Wt Readings from Last 3 Encounters:  01/08/22 181 lb 14.1 oz (82.5 kg)  12/27/21 179 lb 10.8 oz (81.5 kg)  10/30/21 183 lb 3.2 oz (83.1 kg)   There is no height or weight on file to calculate BMI.    Physical Exam         Assessment & Plan:    See Problem List for Assessment and Plan of chronic medical problems.

## 2022-01-10 ENCOUNTER — Ambulatory Visit: Payer: Medicare Other | Admitting: Internal Medicine

## 2022-01-10 ENCOUNTER — Encounter (HOSPITAL_COMMUNITY)
Admission: RE | Admit: 2022-01-10 | Discharge: 2022-01-10 | Disposition: A | Discharge status: Home / Self Care | Payer: Medicare Other | Source: Ambulatory Visit | Attending: Thoracic Surgery (Cardiothoracic Vascular Surgery) | Admitting: Thoracic Surgery (Cardiothoracic Vascular Surgery)

## 2022-01-10 ENCOUNTER — Encounter: Payer: Self-pay | Admitting: Internal Medicine

## 2022-01-10 VITALS — BP 132/80 | HR 74 | Temp 97.9°F | Ht 72.0 in | Wt 180.0 lb

## 2022-01-10 DIAGNOSIS — C3412 Malignant neoplasm of upper lobe, left bronchus or lung: Secondary | ICD-10-CM | POA: Diagnosis not present

## 2022-01-10 DIAGNOSIS — C3491 Malignant neoplasm of unspecified part of right bronchus or lung: Secondary | ICD-10-CM | POA: Diagnosis not present

## 2022-01-10 DIAGNOSIS — R09A2 Foreign body sensation, throat: Secondary | ICD-10-CM | POA: Diagnosis not present

## 2022-01-10 DIAGNOSIS — K219 Gastro-esophageal reflux disease without esophagitis: Secondary | ICD-10-CM | POA: Diagnosis not present

## 2022-01-10 DIAGNOSIS — C3411 Malignant neoplasm of upper lobe, right bronchus or lung: Secondary | ICD-10-CM

## 2022-01-10 DIAGNOSIS — H6992 Unspecified Eustachian tube disorder, left ear: Secondary | ICD-10-CM | POA: Diagnosis not present

## 2022-01-10 MED ORDER — OMEPRAZOLE 40 MG PO CPDR: 40.0000 mg | DELAYED_RELEASE_CAPSULE | Freq: Every morning | ORAL | Status: DC

## 2022-01-10 NOTE — Assessment & Plan Note (Signed)
Acute Left ear with eustachian tube dysfunction Exam is normal Advised gently popping the ear to see if that helps Can start Flonase nasal spray daily-discussed risk of nosebleeds Referral ordered for ENT

## 2022-01-10 NOTE — Assessment & Plan Note (Addendum)
Chronic Denies symptoms, but has h/o gastritis w/o symptoms in the past Last EGD 2021 - erythema noted in antrum of stomach - omeprazole inc to 40 mg bid from 20 mg daily Currently taking omeprazole 40 mg daily - continue  - likely has silent gerd

## 2022-01-10 NOTE — Assessment & Plan Note (Signed)
Acute He describes a globus sensation He does have silent GERD and has a history of gastritis Currently on omeprazole 40 mg daily-advised for him to continue Also states a scratching sensation when he eats dry foods in his lower throat-?  Dysphagia-improved when he drinks liquids when eating Referral ordered for ENT

## 2022-01-10 NOTE — Assessment & Plan Note (Signed)
Chronic Being followed at [name] oncology Has bilateral lung cancer-3 different types of cancer have been found S/p chemo x 3 treatments in September-carboplatin, paclitaxel, nivolumab VATS on right 11/23 - Lymph nodes positive Has imaging soon and then they will determine next treatment

## 2022-01-10 NOTE — Patient Instructions (Addendum)
       Medications changes include :   none    A referral was ordered for ENT - [facility] on [address] will call you to schedule an appointment.

## 2022-01-10 NOTE — Progress Notes (Signed)
Daily Session Note  Patient Details  Name: KOLSON CHOVANEC MRN: [id number] Date of Birth: 09/24/1945 Referring Provider:   Flowsheet Row PULMONARY REHAB OTHER RESP ORIENTATION from 12/27/2021 in [place]  Referring Provider Dr. Williemae Natter       Encounter Date: 01/10/2022  Check In:  Session Check In - 01/10/22 1330       Check-In   Supervising physician immediately available to respond to emergencies [facility] MD immediately available    Physician(s) Dr Domenic Polite    Location [facility]    Staff Present Hoy Register MHA, MS, ACSM-CEP;Melven Sartorius BSN, RN    Virtual Visit No    Medication changes reported     No    Fall or balance concerns reported    No    Tobacco Cessation No Change    Warm-up and Cool-down Performed as group-led Higher education careers adviser Performed Yes    VAD Patient? No    PAD/SET Patient? No      Pain Assessment   Currently in Pain? No/denies    Pain Score 0-No pain    Multiple Pain Sites No             Capillary Blood Glucose: No results found for this or any previous visit (from the past 24 hour(s)).    Social History   Tobacco Use  Smoking Status Some Days   Types: Cigarettes   Last attempt to quit: 01/07/1998   Years since quitting: 24.0  Smokeless Tobacco Never  Tobacco Comments   Smokes "every once in a while"   07/25/21 ARJ     Goals Met:  Proper associated with RPD/PD & O2 Sat Independence with exercise equipment Using PLB without cueing & demonstrates good technique Exercise tolerated well Queuing for purse lip breathing No report of concerns or symptoms today Strength training completed today  Goals Unmet:  Not Applicable  Comments: check out at 2:30   Dr. Kathie Dike is Medical Director for [facility] Pulmonary Rehab.

## 2022-01-11 DIAGNOSIS — E221 Hyperprolactinemia: Secondary | ICD-10-CM | POA: Diagnosis not present

## 2022-01-11 DIAGNOSIS — E230 Hypopituitarism: Secondary | ICD-10-CM | POA: Diagnosis not present

## 2022-01-11 DIAGNOSIS — D7589 Other specified diseases of blood and blood-forming organs: Secondary | ICD-10-CM | POA: Diagnosis not present

## 2022-01-11 DIAGNOSIS — D352 Benign neoplasm of pituitary gland: Secondary | ICD-10-CM | POA: Diagnosis not present

## 2022-01-12 DIAGNOSIS — C3411 Malignant neoplasm of upper lobe, right bronchus or lung: Secondary | ICD-10-CM | POA: Diagnosis not present

## 2022-01-12 DIAGNOSIS — C3412 Malignant neoplasm of upper lobe, left bronchus or lung: Secondary | ICD-10-CM | POA: Diagnosis not present

## 2022-01-15 ENCOUNTER — Encounter (HOSPITAL_COMMUNITY)
Admission: RE | Admit: 2022-01-15 | Discharge: 2022-01-15 | Disposition: A | Discharge status: Home / Self Care | Payer: Medicare Other | Source: Ambulatory Visit | Attending: Thoracic Surgery (Cardiothoracic Vascular Surgery) | Admitting: Thoracic Surgery (Cardiothoracic Vascular Surgery)

## 2022-01-15 DIAGNOSIS — C3412 Malignant neoplasm of upper lobe, left bronchus or lung: Secondary | ICD-10-CM | POA: Diagnosis not present

## 2022-01-15 DIAGNOSIS — C3411 Malignant neoplasm of upper lobe, right bronchus or lung: Secondary | ICD-10-CM

## 2022-01-15 NOTE — Progress Notes (Signed)
Daily Session Note  Patient Details  Name: Patrick Harris MRN: [id number] Date of Birth: 1945/08/26 Referring Provider:   [place] from 12/27/2021 in [place]  Referring Provider Dr. Williemae Natter       Encounter Date: 01/15/2022  Check In:  Session Check In - 01/15/22 1324       Check-In   Supervising physician immediately available to respond to emergencies [facility] MD immediately available    Physician(s) Dr Dellia Cloud    Location [facility]    Staff Present Hoy Register MHA, MS, ACSM-CEP;Leana Roe, BS, Exercise Physiologist;Phyllis Billingsley, RN;Anderson [place]brooks Hassell Done, RN, BSN    Virtual Visit No    Medication changes reported     No    Fall or balance concerns reported    No    Warm-up and Cool-down Performed as group-led instruction    Resistance Training Performed Yes    VAD Patient? No    PAD/SET Patient? No      Pain Assessment   Currently in Pain? No/denies    Pain Score 0-No pain    Multiple Pain Sites No             Capillary Blood Glucose: No results found for this or any previous visit (from the past 24 hour(s)).    Social History   Tobacco Use  Smoking Status Some Days   Types: Cigarettes   Last attempt to quit: 01/07/1998   Years since quitting: 24.0  Smokeless Tobacco Never  Tobacco Comments   Smokes "every once in a while"   07/25/21 ARJ     Goals Met:  Proper associated with RPD/PD & O2 Sat Independence with exercise equipment Using PLB without cueing & demonstrates good technique Exercise tolerated well Queuing for purse lip breathing No report of concerns or symptoms today Strength training completed today  Goals Unmet:  Not Applicable  Comments: Checkout at 1430.   Dr. Kathie Dike is Medical Director for [facility] Pulmonary Rehab.

## 2022-01-16 DIAGNOSIS — C3412 Malignant neoplasm of upper lobe, left bronchus or lung: Secondary | ICD-10-CM | POA: Diagnosis not present

## 2022-01-16 DIAGNOSIS — C3411 Malignant neoplasm of upper lobe, right bronchus or lung: Secondary | ICD-10-CM | POA: Diagnosis not present

## 2022-01-17 ENCOUNTER — Encounter (HOSPITAL_COMMUNITY)
Admission: RE | Admit: 2022-01-17 | Discharge: 2022-01-17 | Disposition: A | Discharge status: Home / Self Care | Payer: Medicare Other | Source: Ambulatory Visit | Attending: Thoracic Surgery (Cardiothoracic Vascular Surgery) | Admitting: Thoracic Surgery (Cardiothoracic Vascular Surgery)

## 2022-01-17 DIAGNOSIS — C3411 Malignant neoplasm of upper lobe, right bronchus or lung: Secondary | ICD-10-CM

## 2022-01-17 DIAGNOSIS — C3412 Malignant neoplasm of upper lobe, left bronchus or lung: Secondary | ICD-10-CM

## 2022-01-17 NOTE — Progress Notes (Signed)
Daily Session Note  Patient Details  Name: Patrick Harris MRN: [id number] Date of Birth: May 19, 1945 Referring Provider:   [place] from 12/27/2021 in [place]  Referring Provider Dr. Williemae Natter       Encounter Date: 01/17/2022  Check In:  Session Check In - 01/17/22 1330       Check-In   Supervising physician immediately available to respond to emergencies [facility] MD immediately available    Physician(s) Dr Dellia Cloud    Location [facility]    Staff Present Leana Roe, BS, Exercise Physiologist;Charee Tumblin BSN, RN    Virtual Visit No    Medication changes reported     No    Fall or balance concerns reported    No    Tobacco Cessation No Change    Warm-up and Cool-down Performed as group-led instruction    Resistance Training Performed Yes    VAD Patient? No    PAD/SET Patient? No      Pain Assessment   Currently in Pain? No/denies    Pain Score 0-No pain    Multiple Pain Sites No             Capillary Blood Glucose: No results found for this or any previous visit (from the past 24 hour(s)).    Social History   Tobacco Use  Smoking Status Some Days   Types: Cigarettes   Last attempt to quit: 01/07/1998   Years since quitting: 24.0  Smokeless Tobacco Never  Tobacco Comments   Smokes "every once in a while"   07/25/21 ARJ     Goals Met:  Proper associated with RPD/PD & O2 Sat Independence with exercise equipment Using PLB without cueing & demonstrates good technique Exercise tolerated well Queuing for purse lip breathing No report of concerns or symptoms today Strength training completed today  Goals Unmet:  Not Applicable  Comments: check out at 2:30   Dr. Kathie Dike is Medical Director for [facility] Pulmonary Rehab.

## 2022-01-18 DIAGNOSIS — C3412 Malignant neoplasm of upper lobe, left bronchus or lung: Secondary | ICD-10-CM | POA: Diagnosis not present

## 2022-01-18 DIAGNOSIS — C3492 Malignant neoplasm of unspecified part of left bronchus or lung: Secondary | ICD-10-CM | POA: Diagnosis not present

## 2022-01-18 DIAGNOSIS — R590 Localized enlarged lymph nodes: Secondary | ICD-10-CM | POA: Diagnosis not present

## 2022-01-18 DIAGNOSIS — J90 Pleural effusion, not elsewhere classified: Secondary | ICD-10-CM | POA: Diagnosis not present

## 2022-01-18 DIAGNOSIS — C3411 Malignant neoplasm of upper lobe, right bronchus or lung: Secondary | ICD-10-CM | POA: Diagnosis not present

## 2022-01-22 ENCOUNTER — Encounter (HOSPITAL_COMMUNITY)
Admission: RE | Admit: 2022-01-22 | Discharge: 2022-01-22 | Disposition: A | Discharge status: Home / Self Care | Payer: Medicare Other | Source: Ambulatory Visit | Attending: Thoracic Surgery (Cardiothoracic Vascular Surgery) | Admitting: Thoracic Surgery (Cardiothoracic Vascular Surgery)

## 2022-01-22 VITALS — Wt 181.9 lb

## 2022-01-22 DIAGNOSIS — C3412 Malignant neoplasm of upper lobe, left bronchus or lung: Secondary | ICD-10-CM

## 2022-01-22 DIAGNOSIS — C3411 Malignant neoplasm of upper lobe, right bronchus or lung: Secondary | ICD-10-CM | POA: Diagnosis not present

## 2022-01-22 NOTE — Progress Notes (Signed)
Daily Session Note  Patient Details  Name: Patrick Harris MRN: [id number] Date of Birth: Sep 19, 1945 Referring Provider:   Flowsheet Row PULMONARY REHAB OTHER RESP ORIENTATION from 12/27/2021 in [facility] CARDIAC REHABILITATION  Referring Provider Dr. Glenna Durand       Encounter Date: 01/22/2022  Check In:  Session Check In - 01/22/22 1324       Check-In   Supervising physician immediately available to respond to emergencies [facility] immediately available    Physician(s) Dr Wyline Mood    Location [facility]    Staff Present Ross Ludwig, BS, Exercise Physiologist;Hillary Troutman BSN, RN;Angas Isabell Daphine Deutscher, RN, BSN    Virtual Visit No    Medication changes reported     No    Fall or balance concerns reported    No    Tobacco Cessation No Change    Warm-up and Cool-down Performed as group-led instruction    Resistance Training Performed Yes    VAD Patient? No    PAD/SET Patient? No      Pain Assessment   Currently in Pain? No/denies    Pain Score 0-No pain    Multiple Pain Sites No             Capillary Blood Glucose: No results found for this or any previous visit (from the past 24 hour(s)).    Social History   Tobacco Use  Smoking Status Some Days   Types: Cigarettes   Last attempt to quit: 01/07/1998   Years since quitting: 24.0  Smokeless Tobacco Never  Tobacco Comments   Smokes "every once in a while"   07/25/21 ARJ     Goals Met:  Proper associated with RPD/PD & O2 Sat Independence with exercise equipment Using PLB without cueing & demonstrates good technique Exercise tolerated well Queuing for purse lip breathing No report of concerns or symptoms today Strength training completed today  Goals Unmet:  Not Applicable  Comments: Checkout at 1430.   Dr. Erick Blinks is Medical Director for [facility] Pulmonary Rehab.

## 2022-01-24 ENCOUNTER — Encounter (HOSPITAL_COMMUNITY)
Admission: RE | Admit: 2022-01-24 | Discharge: 2022-01-24 | Disposition: A | Discharge status: Home / Self Care | Payer: Medicare Other | Source: Ambulatory Visit | Attending: Thoracic Surgery (Cardiothoracic Vascular Surgery) | Admitting: Thoracic Surgery (Cardiothoracic Vascular Surgery)

## 2022-01-24 DIAGNOSIS — C3411 Malignant neoplasm of upper lobe, right bronchus or lung: Secondary | ICD-10-CM

## 2022-01-24 DIAGNOSIS — C3412 Malignant neoplasm of upper lobe, left bronchus or lung: Secondary | ICD-10-CM

## 2022-01-24 NOTE — Progress Notes (Signed)
Daily Session Note  Patient Details  Name: Patrick Harris MRN: [id number] Date of Birth: 1945/11/04 Referring Provider:   Flowsheet Row PULMONARY REHAB OTHER RESP ORIENTATION from 12/27/2021 in [facility] CARDIAC REHABILITATION  Referring Provider Dr. Glenna Durand       Encounter Date: 01/24/2022  Check In:  Session Check In - 01/24/22 1326       Check-In   Supervising physician immediately available to respond to emergencies [facility] MD immediately available    Physician(s) Dr Wyline Mood    Location [facility]    Staff Present Ross Ludwig, BS, Exercise Physiologist;Mara Favero Daphine Deutscher, RN, Neal Dy, RN, BSN    Virtual Visit No    Medication changes reported     No    Fall or balance concerns reported    No    Tobacco Cessation No Change    Warm-up and Cool-down Performed as group-led instruction    Resistance Training Performed Yes    VAD Patient? No    PAD/SET Patient? No      Pain Assessment   Currently in Pain? No/denies    Pain Score 0-No pain    Multiple Pain Sites No             Capillary Blood Glucose: No results found for this or any previous visit (from the past 24 hour(s)).    Social History   Tobacco Use  Smoking Status Some Days   Types: Cigarettes   Last attempt to quit: 01/07/1998   Years since quitting: 24.0  Smokeless Tobacco Never  Tobacco Comments   Smokes "every once in a while"   07/25/21 ARJ     Goals Met:  Proper associated with RPD/PD & O2 Sat Independence with exercise equipment Using PLB without cueing & demonstrates good technique Exercise tolerated well Queuing for purse lip breathing No report of concerns or symptoms today Strength training completed today  Goals Unmet:  Not Applicable  Comments: Checkout at 1430.   Dr. Erick Blinks is Medical Director for [facility] Pulmonary Rehab.

## 2022-01-29 ENCOUNTER — Encounter (HOSPITAL_COMMUNITY)
Admission: RE | Admit: 2022-01-29 | Discharge: 2022-01-29 | Disposition: A | Discharge status: Home / Self Care | Payer: Medicare Other | Source: Ambulatory Visit | Attending: Thoracic Surgery (Cardiothoracic Vascular Surgery) | Admitting: Thoracic Surgery (Cardiothoracic Vascular Surgery)

## 2022-01-29 DIAGNOSIS — C3411 Malignant neoplasm of upper lobe, right bronchus or lung: Secondary | ICD-10-CM

## 2022-01-29 DIAGNOSIS — C3412 Malignant neoplasm of upper lobe, left bronchus or lung: Secondary | ICD-10-CM | POA: Diagnosis not present

## 2022-01-29 NOTE — Progress Notes (Signed)
Daily Session Note  Patient Details  Name: Patrick Harris MRN: [id number] Date of Birth: 05/06/1945 Referring Provider:   Flowsheet Row PULMONARY REHAB OTHER RESP ORIENTATION from 12/27/2021 in [facility] CARDIAC REHABILITATION  Referring Provider Dr. Glenna Durand       Encounter Date: 01/29/2022  Check In:  Session Check In - 01/29/22 1330       Check-In   Supervising physician immediately available to respond to emergencies [facility] MD immediately available    Physician(s) Dr Diona Browner    Location [facility]    Staff Present Ross Ludwig, BS, Exercise Physiologist;Iretta Mangrum Daphine Deutscher, RN, BSN;Dalton Fletcher MHA, MS, ACSM-CEP    Virtual Visit No    Medication changes reported     No    Fall or balance concerns reported    No    Tobacco Cessation No Change    Warm-up and Cool-down Performed as group-led instruction    Resistance Training Performed Yes    VAD Patient? No    PAD/SET Patient? No      Pain Assessment   Currently in Pain? No/denies    Pain Score 0-No pain    Multiple Pain Sites No             Capillary Blood Glucose: No results found for this or any previous visit (from the past 24 hour(s)).    Social History   Tobacco Use  Smoking Status Some Days   Types: Cigarettes   Last attempt to quit: 01/07/1998   Years since quitting: 24.0  Smokeless Tobacco Never  Tobacco Comments   Smokes "every once in a while"   07/25/21 ARJ     Goals Met:  Proper associated with RPD/PD & O2 Sat Independence with exercise equipment Using PLB without cueing & demonstrates good technique Exercise tolerated well Queuing for purse lip breathing No report of concerns or symptoms today Strength training completed today  Goals Unmet:  Not Applicable  Comments: Checkout at 1430.   Dr. Erick Blinks is Medical Director for [facility] Pulmonary Rehab.

## 2022-01-31 ENCOUNTER — Encounter (HOSPITAL_COMMUNITY)
Admission: RE | Admit: 2022-01-31 | Discharge: 2022-01-31 | Disposition: A | Discharge status: Home / Self Care | Payer: Medicare Other | Source: Ambulatory Visit | Attending: Thoracic Surgery (Cardiothoracic Vascular Surgery) | Admitting: Thoracic Surgery (Cardiothoracic Vascular Surgery)

## 2022-01-31 DIAGNOSIS — C3412 Malignant neoplasm of upper lobe, left bronchus or lung: Secondary | ICD-10-CM | POA: Diagnosis not present

## 2022-01-31 DIAGNOSIS — C3411 Malignant neoplasm of upper lobe, right bronchus or lung: Secondary | ICD-10-CM

## 2022-01-31 NOTE — Progress Notes (Signed)
Daily Session Note  Patient Details  Name: Patrick Harris MRN: [id number] Date of Birth: Sep 28, 1945 Referring Provider:   Flowsheet Row PULMONARY REHAB OTHER RESP ORIENTATION from 12/27/2021 in [place]  Referring Provider Dr. Williemae Natter       Encounter Date: 01/31/2022  Check In:  Session Check In - 01/31/22 1329       Check-In   Supervising physician immediately available to respond to emergencies [facility] MD immediately available    Physician(s) Dr Domenic Polite    Location [facility]    Staff Present Leana Roe, BS, Exercise Physiologist;Tejasvi Brissett BSN, RN;Daphyne Hassell Done, RN, BSN    Virtual Visit No    Medication changes reported     No    Fall or balance concerns reported    No    Tobacco Cessation No Change    Warm-up and Cool-down Performed as group-led instruction    Resistance Training Performed Yes    VAD Patient? No    PAD/SET Patient? No      Pain Assessment   Currently in Pain? No/denies    Pain Score 0-No pain    Multiple Pain Sites No             Capillary Blood Glucose: No results found for this or any previous visit (from the past 24 hour(s)).    Social History   Tobacco Use  Smoking Status Some Days   Types: Cigarettes   Last attempt to quit: 01/07/1998   Years since quitting: 24.0  Smokeless Tobacco Never  Tobacco Comments   Smokes "every once in a while"   07/25/21 ARJ     Goals Met:  Proper associated with RPD/PD & O2 Sat Independence with exercise equipment Using PLB without cueing & demonstrates good technique Exercise tolerated well Queuing for purse lip breathing No report of concerns or symptoms today Strength training completed today  Goals Unmet:  Not Applicable  Comments: check out at 14:30   Dr. Kathie Dike is Medical Director for [facility] Pulmonary Rehab.

## 2022-02-01 NOTE — Progress Notes (Unsigned)
Cardiology Office Note  Date: 02/04/2022   ID: Patrick Harris, DOB April 07, 1945, MRN [id number]  PCP:  [name], MD  Cardiologist:  Rozann Lesches, MD Electrophysiologist:  None   Chief Complaint  Patient presents with   Cardiac follow-up    History of Present Illness: Patrick Harris is a 77 y.o. male last seen in February 2023.  He is here for a routine visit.  Reports no angina or nitroglycerin use, NYHA class II dyspnea with typical activities, no palpitations or syncope.  He is being followed by oncology with history of right lung primary adenocarcinoma status post right VATS upper lobectomy with MLND and partial decortication of the lung at [name] in November 2023, also chemotherapy.  1 over his cardiac medications which are stable and outlined below.  He does need refill for fresh bottle of nitroglycerin.  I personally reviewed his ECG today which shows sinus rhythm with bursts of atrial tachycardia and nonspecific ST changes.  He is following lab work with PCP, he states that lipids have been well-controlled on current dose of Crestor.  Past Medical History:  Diagnosis Date   Arthritis    CAD (coronary artery disease)    CABG with LIMA to LAD and RIMA to RCA August 2000 - Dr. Roxan Hockey   Cancer [facility])    basal cell   CHF (congestive heart failure) (HCC)    COPD (chronic obstructive pulmonary disease) (HCC)    Elevated glycated hemoglobin    Essential hypertension    GERD (gastroesophageal reflux disease)    Heart murmur    Hyperlipidemia    Hypopituitarism ([place])    Lung cancer (HCC)    Osteopenia    Dr Wilson Singer   Pancreatitis    Peyronie disease    Testosterone deficiency    Dr Wilson Singer    Current Outpatient Medications  Medication Sig Dispense Refill   acetaminophen (TYLENOL) 500 MG tablet Take 500 mg by mouth every 6 (six) hours as needed (for pain.).     aspirin EC 81 MG tablet Take 1 tablet (81 mg total) by mouth at bedtime. Okay to restart this  medication on 07/03/2021 30 tablet 11   Cholecalciferol (VITAMIN D3) 2000 UNITS TABS Take 2,000 Units by mouth in the morning and at bedtime.     Coenzyme Q10 (COQ-10) 100 MG CAPS Take 100 mg by mouth in the morning.     Cyanocobalamin (B-12 PO) Take 1 tablet by mouth daily.     hydrALAZINE (APRESOLINE) 25 MG tablet Take 1 tablet (25 mg total) by mouth 3 (three) times daily. 270 tablet 3   irbesartan (AVAPRO) 300 MG tablet Take 1 tablet (300 mg total) by mouth daily. 90 tablet 3   Omega-3 Fatty Acids (OMEGA-3 FISH OIL PO) Take 1,000 mg by mouth in the morning.     omeprazole (PRILOSEC) 40 MG capsule Take 1 capsule (40 mg total) by mouth in the morning.     ondansetron (ZOFRAN) 8 MG tablet Take 8 mg by mouth every 8 (eight) hours as needed for vomiting or nausea.     Polyethyl Glycol-Propyl Glycol (LUBRICANT EYE DROPS) 0.4-0.3 % SOLN Place 1-2 drops into both eyes 3 (three) times daily as needed (dry/irritated eyes.).     rosuvastatin (CRESTOR) 20 MG tablet TAKE HALF A TABLET (10 MG) BY MOUTH ONCE DAILY. 45 tablet 3   tadalafil (CIALIS) 5 MG tablet Take 5 mg by mouth daily as needed for erectile dysfunction.     tamsulosin (FLOMAX)  0.4 MG CAPS capsule Take 0.4 mg by mouth in the morning.     Testosterone 20.25 MG/ACT (1.62%) GEL Apply 2 Pump topically in the morning.     vitamin C (ASCORBIC ACID) 500 MG tablet Take 500 mg by mouth in the morning.     nitroGLYCERIN (NITROSTAT) 0.4 MG SL tablet Place 1 tablet (0.4 mg total) under the tongue every 5 (five) minutes x 3 doses as needed for chest pain (if no relief after 2nd dose, proceed to ED or call 911). 25 tablet 3   No current facility-administered medications for this visit.   Allergies:  Latex, Bromocriptine, Zestril [lisinopril], Diovan [valsartan], Hydrochlorothiazide, Norvasc [amlodipine], and Zocor [simvastatin]   ROS: No orthopnea or PND.  Physical Exam: VS:  BP 130/70   Pulse (!) 52   Ht 6' (1.829 m)   Wt 188 lb (85.3 kg)   SpO2 99%    BMI 25.50 kg/m , BMI Body mass index is 25.5 kg/m.  Wt Readings from Last 3 Encounters:  02/04/22 188 lb (85.3 kg)  01/22/22 181 lb 14.1 oz (82.5 kg)  01/10/22 180 lb (81.6 kg)    General: Patient appears comfortable at rest. HEENT: Conjunctiva and lids normal. Neck: Supple, no elevated JVP or carotid bruits. Lungs: No wheezing, nonlabored breathing at rest. Cardiac: Regular rate and rhythm with occasional ectopy, no S3, 1/6 systolic murmur, no pericardial rub. Extremities: No pitting edema.  ECG:  An ECG dated 02/07/2021 was personally reviewed today and demonstrated:  Sinus bradycardia.  Recent Labwork: 05/25/2021: ALT 15; AST 16; TSH 1.25 07/02/2021: BUN 17; Creatinine, Ser 0.98; Hemoglobin 17.3; Platelets 162; Potassium 4.3; Sodium 136     Component Value Date/Time   CHOL 118 08/29/2020 0858   TRIG 96.0 08/29/2020 0858   HDL 33.60 (L) 08/29/2020 0858   CHOLHDL 3 08/29/2020 0858   VLDL 19.2 08/29/2020 0858   LDLCALC 65 08/29/2020 0858   LDLCALC 71 08/25/2019 0847  January 2024: Hemoglobin 12.8, platelets 205  Other Studies Reviewed Today:  Lexiscan Myoview 01/15/2018: There was no ST segment deviation noted during stress. Defect 1: There is a medium defect of moderate severity present in the mid inferoseptal, mid inferior and apical inferior location. Findings consistent with probable scar with some degree of soft tissue attenuation contributing to the defects. This is a low risk study overall. No ischemic zones. Nuclear stress EF: 48%.  Echocardiogram 01/15/2018: Study Conclusions   - Left ventricle: The cavity size was normal. Wall thickness was   increased in a pattern of mild LVH. Systolic function was normal.   The estimated ejection fraction was in the range of 55% to 60%.   Wall motion was normal; there were no regional wall motion   abnormalities. Features are consistent with a pseudonormal left   ventricular filling pattern, with concomitant abnormal  relaxation   and increased filling pressure (grade 2 diastolic dysfunction).   Doppler parameters are consistent with indeterminate ventricular   filling pressure. - Aortic valve: Mildly to moderately calcified annulus. Mildly   thickened, mildly calcified leaflets. Sclerosis without stenosis. - Left atrium: The atrium was mildly dilated. - Right ventricle: Systolic function was mildly reduced. - Systemic veins: The IVC is dilated with normal respiratory   variation. Estimated right atrial pressure is 8 mmHg.  Assessment and Plan:  1.  Multivessel CAD status post CABG in 2020.  We continue medical therapy and observation in the absence of angina.  Currently on aspirin, Avapro, hydralazine, and Crestor.  Not  on beta-blocker with history of resting bradycardia.  Plan to refill nitroglycerin.  2.  Mixed hyperlipidemia, tolerating Crestor.  Keep routine follow-up lab work with PCP, goal LDL 50-60.  Medication Adjustments/Labs and Tests Ordered: Current medicines are reviewed at length with the patient today.  Concerns regarding medicines are outlined above.   Tests Ordered: Orders Placed This Encounter  Procedures   EKG 12-Lead    Medication Changes: Meds ordered this encounter  Medications   nitroGLYCERIN (NITROSTAT) 0.4 MG SL tablet    Sig: Place 1 tablet (0.4 mg total) under the tongue every 5 (five) minutes x 3 doses as needed for chest pain (if no relief after 2nd dose, proceed to ED or call 911).    Dispense:  25 tablet    Refill:  3    Disposition:  Follow up  1 year, sooner if needed.  Signed, Satira Sark, MD, [facility] 02/04/2022 9:32 AM    [facility] [place]. [address] [place], [place] [address] Phone: [phone]; Fax: [phone]

## 2022-02-04 ENCOUNTER — Encounter: Payer: Self-pay | Admitting: Cardiology

## 2022-02-04 ENCOUNTER — Ambulatory Visit: Payer: Medicare Other | Attending: Cardiology | Admitting: Cardiology

## 2022-02-04 VITALS — BP 130/70 | HR 52 | Ht 72.0 in | Wt 188.0 lb

## 2022-02-04 DIAGNOSIS — I25119 Atherosclerotic heart disease of native coronary artery with unspecified angina pectoris: Secondary | ICD-10-CM

## 2022-02-04 DIAGNOSIS — E782 Mixed hyperlipidemia: Secondary | ICD-10-CM

## 2022-02-04 MED ORDER — NITROGLYCERIN 0.4 MG SL SUBL: 0.4000 mg | SUBLINGUAL_TABLET | SUBLINGUAL | 3 refills | Status: DC | PRN

## 2022-02-04 NOTE — Patient Instructions (Signed)
Medication Instructions:  Your physician recommends that you continue on your current medications as directed. Please refer to the Current Medication list given to you today.   Labwork: None today  Testing/Procedures: None today  Follow-Up: 1 year  Any Other Special Instructions Will Be Listed Below (If Applicable).  If you need a refill on your cardiac medications before your next appointment, please call your pharmacy.  

## 2022-02-05 ENCOUNTER — Encounter (HOSPITAL_COMMUNITY)
Admission: RE | Admit: 2022-02-05 | Discharge: 2022-02-05 | Disposition: A | Discharge status: Home / Self Care | Payer: Medicare Other | Source: Ambulatory Visit | Attending: Thoracic Surgery (Cardiothoracic Vascular Surgery) | Admitting: Thoracic Surgery (Cardiothoracic Vascular Surgery)

## 2022-02-05 VITALS — Wt 185.6 lb

## 2022-02-05 DIAGNOSIS — C3412 Malignant neoplasm of upper lobe, left bronchus or lung: Secondary | ICD-10-CM | POA: Diagnosis not present

## 2022-02-05 DIAGNOSIS — C3411 Malignant neoplasm of upper lobe, right bronchus or lung: Secondary | ICD-10-CM

## 2022-02-05 NOTE — Progress Notes (Signed)
Daily Session Note  Patient Details  Name: Patrick Harris MRN: [id number] Date of Birth: 09-Apr-1945 Referring Provider:   [place] from 12/27/2021 in [place]  Referring Provider Dr. Williemae Natter       Encounter Date: 02/05/2022  Check In:  Session Check In - 02/05/22 1330       Check-In   Supervising physician immediately available to respond to emergencies [facility] MD immediately available    Physician(s) Dr Dellia Cloud    Location [facility]    Staff Present Leana Roe, BS, Exercise Physiologist;Jeffry Vogelsang Hassell Done, RN, BSN;Phyllis Billingsley, RN    Virtual Visit No    Medication changes reported     No    Fall or balance concerns reported    No    Tobacco Cessation No Change    Warm-up and Cool-down Performed as group-led instruction    Resistance Training Performed Yes    VAD Patient? No    PAD/SET Patient? No      Pain Assessment   Currently in Pain? No/denies    Pain Score 0-No pain    Multiple Pain Sites No             Capillary Blood Glucose: No results found for this or any previous visit (from the past 24 hour(s)).    Social History   Tobacco Use  Smoking Status Some Days   Types: Cigarettes   Last attempt to quit: 01/07/1998   Years since quitting: 24.0  Smokeless Tobacco Never  Tobacco Comments   Smokes "every once in a while"   07/25/21 [facility]J     Goals Met:  Independence with exercise equipment Using PLB without cueing & demonstrates good technique Exercise tolerated well Queuing for purse lip breathing No report of concerns or symptoms today Strength training completed today  Goals Unmet:  Not Applicable  Comments: Checkout at 1430.   Dr. Kathie Dike is Medical Director for [facility].

## 2022-02-06 DIAGNOSIS — C3411 Malignant neoplasm of upper lobe, right bronchus or lung: Secondary | ICD-10-CM | POA: Diagnosis not present

## 2022-02-06 DIAGNOSIS — C3412 Malignant neoplasm of upper lobe, left bronchus or lung: Secondary | ICD-10-CM | POA: Diagnosis not present

## 2022-02-06 NOTE — Progress Notes (Signed)
Pulmonary Individual Treatment Plan  Patient Details  Name: Patrick Harris MRN: [id number] Date of Birth: [birth year] Referring Provider:   [place] from 12/27/2021 in [place]  Referring Provider Dr. Williemae Natter       Initial Encounter Date:  Flowsheet Row PULMONARY REHAB OTHER RESP ORIENTATION from 12/27/2021 in [place]  Date 12/27/21       Visit Diagnosis: Malignant neoplasm of right upper lobe of lung ([place])  Malignant neoplasm of upper lobe of left lung ([place])  Patient's Home Medications on Admission:   Current Outpatient Medications:    acetaminophen (TYLENOL) 500 MG tablet, Take 500 mg by mouth every 6 (six) hours as needed (for pain.)., Disp: , Rfl:    aspirin EC 81 MG tablet, Take 1 tablet (81 mg total) by mouth at bedtime. Okay to restart this medication on 07/03/2021, Disp: 30 tablet, Rfl: 11   Cholecalciferol (VITAMIN D3) 2000 UNITS TABS, Take 2,000 Units by mouth in the morning and at bedtime., Disp: , Rfl:    Coenzyme Q10 (COQ-10) 100 MG CAPS, Take 100 mg by mouth in the morning., Disp: , Rfl:    Cyanocobalamin (B-12 PO), Take 1 tablet by mouth daily., Disp: , Rfl:    hydrALAZINE (APRESOLINE) 25 MG tablet, Take 1 tablet (25 mg total) by mouth 3 (three) times daily., Disp: 270 tablet, Rfl: 3   irbesartan (AVAPRO) 300 MG tablet, Take 1 tablet (300 mg total) by mouth daily., Disp: 90 tablet, Rfl: 3   nitroGLYCERIN (NITROSTAT) 0.4 MG SL tablet, Place 1 tablet (0.4 mg total) under the tongue every 5 (five) minutes x 3 doses as needed for chest pain (if no relief after 2nd dose, proceed to ED or call 911)., Disp: 25 tablet, Rfl: 3   Omega-3 Fatty Acids (OMEGA-3 FISH OIL PO), Take 1,000 mg by mouth in the morning., Disp: , Rfl:    omeprazole (PRILOSEC) 40 MG capsule, Take 1 capsule (40 mg total) by mouth in the morning., Disp: , Rfl:    ondansetron (ZOFRAN) 8 MG tablet, Take 8 mg by mouth  every 8 (eight) hours as needed for vomiting or nausea., Disp: , Rfl:    Polyethyl Glycol-Propyl Glycol (LUBRICANT EYE DROPS) 0.4-0.3 % SOLN, Place 1-2 drops into both eyes 3 (three) times daily as needed (dry/irritated eyes.)., Disp: , Rfl:    rosuvastatin (CRESTOR) 20 MG tablet, TAKE HALF A TABLET (10 MG) BY MOUTH ONCE DAILY., Disp: 45 tablet, Rfl: 3   tadalafil (CIALIS) 5 MG tablet, Take 5 mg by mouth daily as needed for erectile dysfunction., Disp: , Rfl:    tamsulosin (FLOMAX) 0.4 MG CAPS capsule, Take 0.4 mg by mouth in the morning., Disp: , Rfl:    Testosterone 20.25 MG/ACT (1.62%) GEL, Apply 2 Pump topically in the morning., Disp: , Rfl:    vitamin C (ASCORBIC ACID) 500 MG tablet, Take 500 mg by mouth in the morning., Disp: , Rfl:   Past Medical History: Past Medical History:  Diagnosis Date   Arthritis    CAD (coronary artery disease)    CABG with LIMA to LAD and RIMA to RCA August 2000 - Dr. Roxan Hockey   Cancer [facility])    basal cell   CHF (congestive heart failure) (HCC)    COPD (chronic obstructive pulmonary disease) (H[facility])    Elevated glycated hemoglobin    Essential hypertension    GERD (gastroesophageal reflux disease)    Heart murmur    Hyperlipidemia  Hypopituitarism ([place])    Lung cancer ([place])    Osteopenia    Dr Wilson Singer   Pancreatitis    Peyronie disease    Testosterone deficiency    Dr Wilson Singer    Tobacco Use: Social History   Tobacco Use  Smoking Status Some Days   Types: Cigarettes   Last attempt to quit: 01/07/1998   Years since quitting: 24.0  Smokeless Tobacco Never  Tobacco Comments   Smokes "every once in a while"   07/25/21 [facility]     Labs: Review Flowsheet  More data exists      Latest Ref Rng & Units 08/22/2017 12/23/2017 08/24/2018 08/25/2019 08/29/2020  Labs for ITP Cardiac and Pulmonary Rehab  Cholestrol 0 - 200 mg/dL 128  137  146  122  118   LDL (calc) 0 - 99 mg/dL 71  81  86  71  65   HDL-C >39.00 mg/dL 29.10  36  34.20  33  33.60    Trlycerides 0.0 - 149.0 mg/dL 140.0  101  130.0  98  96.0   Hemoglobin A1c 4.6 - 6.5 % 6.2  5.9  6.1  5.5  6.0     Capillary Blood Glucose: Lab Results  Component Value Date   GLUCAP 117 (H) 07/23/2021     Pulmonary Assessment Scores:  Pulmonary Assessment Scores     Row Name 09/05/21 1249 12/27/21 1312       ADL UCSD   ADL Phase Entry Entry    SOB Score total 16 69    Rest 0 2    Walk 2 3    Stairs 3 4    Bath 0 3    Dress 0 3    Shop 0 3      CAT Score   CAT Score 16 23      mMRC Score   mMRC Score 0 3            UCSD: Self-administered rating of dyspnea associated with activities of daily living (ADLs) 6-point scale (0 = "not at all" to 5 = "maximal or unable to do because of breathlessness")  Scoring Scores range from 0 to 120.  Minimally important difference is 5 units  CAT: CAT can identify the health impairment of COPD patients and is better correlated with disease progression.  CAT has a scoring range of zero to 40. The CAT score is classified into four groups of low (less than 10), medium (10 - 20), high (21-30) and very high (31-40) based on the impact level of disease on health status. A CAT score over 10 suggests significant symptoms.  A worsening CAT score could be explained by an exacerbation, poor medication adherence, poor inhaler technique, or progression of COPD or comorbid conditions.  CAT MCID is 2 points  mMRC: mMRC (Modified Medical Research Council) Dyspnea Scale is used to assess the degree of baseline functional disability in patients of respiratory disease due to dyspnea. No minimal important difference is established. A decrease in score of 1 point or greater is considered a positive change.   Pulmonary Function Assessment:   Exercise Target Goals: Exercise Program Goal: Individual exercise prescription set using results from initial 6 min walk test and THRR while considering  patient's activity barriers and safety.   Exercise  Prescription Goal: Initial exercise prescription builds to 30-45 minutes a day of aerobic activity, 2-3 days per week.  Home exercise guidelines will be given to patient during program as part of exercise prescription  that the participant will acknowledge.  Activity Barriers & Risk Stratification:  Activity Barriers & Cardiac Risk Stratification - 12/27/21 1250       Activity Barriers & Cardiac Risk Stratification   Activity Barriers Shortness of Breath;Deconditioning;Neck/Spine Problems    Cardiac Risk Stratification High             6 Minute Walk:  6 Minute Walk     Row Name 09/05/21 1311 12/27/21 1331       6 Minute Walk   Phase Initial Initial    Distance 1400 feet 1200 feet    Walk Time 6 minutes 6 minutes    # of Rest Breaks 0 0    MPH 2.65 2.27    METS 2.92 2.88    RPE 11 12    Perceived Dyspnea  11 12    VO2 Peak 10.24 10.09    Symptoms No No    Resting HR 68 bpm 80 bpm    Resting BP 132/62 110/60    Resting Oxygen Saturation  95 % 96 %    Exercise Oxygen Saturation  during 6 min walk 94 % 93 %    Max Ex. HR 75 bpm 121 bpm    Max Ex. BP 160/60 130/60    2 Minute Post BP 130/60 120/60      Interval HR   1 Minute HR 74 114    2 Minute HR 75 105    3 Minute HR 74 110    4 Minute HR 74 105    5 Minute HR 74 104    6 Minute HR 75 121    2 Minute Post HR 65 80    Interval Heart Rate? Yes Yes      Interval Oxygen   Interval Oxygen? Yes Yes    Baseline Oxygen Saturation % 95 % 96 %    1 Minute Oxygen Saturation % 95 % 96 %    1 Minute Liters of Oxygen 0 L 0 L    2 Minute Oxygen Saturation % 95 % 94 %    2 Minute Liters of Oxygen 0 L 0 L    3 Minute Oxygen Saturation % 94 % 95 %    3 Minute Liters of Oxygen 0 L 0 L    4 Minute Oxygen Saturation % 95 % 93 %    4 Minute Liters of Oxygen 0 L 0 L    5 Minute Oxygen Saturation % 95 % 95 %    5 Minute Liters of Oxygen 0 L 0 L    6 Minute Oxygen Saturation % 95 % 94 %    6 Minute Liters of Oxygen 0 L 0 L     2 Minute Post Oxygen Saturation % 95 % 96 %    2 Minute Post Liters of Oxygen 0 L 0 L             Oxygen Initial Assessment:  Oxygen Initial Assessment - 12/27/21 1311       Home Oxygen   Home Oxygen Device None    Sleep Oxygen Prescription None    Home Exercise Oxygen Prescription None    Home Resting Oxygen Prescription None      Initial 6 min Walk   Oxygen Used None      Program Oxygen Prescription   Program Oxygen Prescription None      Intervention   Short Term Goals To learn and understand importance of monitoring SPO2 with pulse oximeter  and demonstrate accurate use of the pulse oximeter.;To learn and understand importance of maintaining oxygen saturations>88%;To learn and demonstrate proper pursed lip breathing techniques or other breathing techniques.     Long  Term Goals Verbalizes importance of monitoring SPO2 with pulse oximeter and return demonstration;Maintenance of O2 saturations>88%;Exhibits proper breathing techniques, such as pursed lip breathing or other method taught during program session             Oxygen Re-Evaluation:  Oxygen Re-Evaluation     Row Name 09/18/21 1428 10/16/21 1359 01/08/22 1604 02/05/22 1526       Program Oxygen Prescription   Program Oxygen Prescription None None None None      Home Oxygen   Home Oxygen Device None None None None    Sleep Oxygen Prescription None None None None    Home Exercise Oxygen Prescription None None None None    Home Resting Oxygen Prescription None None None None    Compliance with Home Oxygen Use Yes Yes Yes Yes      Goals/Expected Outcomes   Short Term Goals To learn and understand importance of maintaining oxygen saturations>88%;To learn and demonstrate proper use of respiratory medications;To learn and understand importance of monitoring SPO2 with pulse oximeter and demonstrate accurate use of the pulse oximeter. To learn and understand importance of maintaining oxygen saturations>88%;To learn  and demonstrate proper use of respiratory medications;To learn and understand importance of monitoring SPO2 with pulse oximeter and demonstrate accurate use of the pulse oximeter. To learn and understand importance of monitoring SPO2 with pulse oximeter and demonstrate accurate use of the pulse oximeter.;To learn and understand importance of maintaining oxygen saturations>88%;To learn and demonstrate proper pursed lip breathing techniques or other breathing techniques.  To learn and understand importance of monitoring SPO2 with pulse oximeter and demonstrate accurate use of the pulse oximeter.;To learn and understand importance of maintaining oxygen saturations>88%;To learn and demonstrate proper pursed lip breathing techniques or other breathing techniques.     Long  Term Goals -- Verbalizes importance of monitoring SPO2 with pulse oximeter and return demonstration;Maintenance of O2 saturations>88%;Exhibits proper breathing techniques, such as pursed lip breathing or other method taught during program session;Compliance with respiratory medication Verbalizes importance of monitoring SPO2 with pulse oximeter and return demonstration;Maintenance of O2 saturations>88%;Exhibits proper breathing techniques, such as pursed lip breathing or other method taught during program session Verbalizes importance of monitoring SPO2 with pulse oximeter and return demonstration;Maintenance of O2 saturations>88%;Exhibits proper breathing techniques, such as pursed lip breathing or other method taught during program session    Goals/Expected Outcomes compliance compliance compliance compliance             Oxygen Discharge (Final Oxygen Re-Evaluation):  Oxygen Re-Evaluation - 02/05/22 1526       Program Oxygen Prescription   Program Oxygen Prescription None      Home Oxygen   Home Oxygen Device None    Sleep Oxygen Prescription None    Home Exercise Oxygen Prescription None    Home Resting Oxygen Prescription None     Compliance with Home Oxygen Use Yes      Goals/Expected Outcomes   Short Term Goals To learn and understand importance of monitoring SPO2 with pulse oximeter and demonstrate accurate use of the pulse oximeter.;To learn and understand importance of maintaining oxygen saturations>88%;To learn and demonstrate proper pursed lip breathing techniques or other breathing techniques.     Long  Term Goals Verbalizes importance of monitoring SPO2 with pulse oximeter and return demonstration;Maintenance of O2  saturations>88%;Exhibits proper breathing techniques, such as pursed lip breathing or other method taught during program session    Goals/Expected Outcomes compliance             Initial Exercise Prescription:  Initial Exercise Prescription - 12/27/21 1300       Date of Initial Exercise RX and Referring Provider   Date 12/27/21    Referring Provider Dr. Williemae Natter    Expected Discharge Date 04/30/22      Treadmill   MPH 1.5    Grade 0    Minutes 17      Recumbant Elliptical   Level 1    RPM 45    Minutes 22      Prescription Details   Frequency (times per week) 2    Duration Progress to 30 minutes of continuous aerobic without signs/symptoms of physical distress      Intensity   THRR 40-80% of Max Heartrate 58-115    Ratings of Perceived Exertion 11-13    Perceived Dyspnea 0-4      Resistance Training   Training Prescription Yes    Weight 4    Reps 10-15             Perform Capillary Blood Glucose checks as needed.  Exercise Prescription Changes:   Exercise Prescription Changes     Row Name 09/18/21 1400 10/04/21 1400 10/11/21 1400 10/30/21 1400 01/08/22 1500     Response to Exercise   Blood Pressure (Admit) 140/64 128/60 120/60 114/60 112/62   Blood Pressure (Exercise) 128/62 160/72 150/60 124/60 130/62   Blood Pressure (Exit) 120/62 130/60 120/60 122/64 122/58   Heart Rate (Admit) 68 bpm 82 bpm 78 bpm 82 bpm 77 bpm   Heart Rate (Exercise) 92 bpm 90 bpm 85 bpm  90 bpm 92 bpm   Heart Rate (Exit) 78 bpm 79 bpm 86 bpm 90 bpm 80 bpm   Oxygen Saturation (Admit) 93 % 94 % 96 % 96 % 95 %   Oxygen Saturation (Exercise) 93 % 93 % 94 % 95 % 93 %   Oxygen Saturation (Exit) 93 % 95 % 94 % 94 % 95 %   Rating of Perceived Exertion (Exercise) 11 12 12 12 12    Perceived Dyspnea (Exercise) 11 12 12 12 12    Duration Continue with 30 min of aerobic exercise without signs/symptoms of physical distress. Continue with 30 min of aerobic exercise without signs/symptoms of physical distress. Continue with 30 min of aerobic exercise without signs/symptoms of physical distress. Continue with 30 min of aerobic exercise without signs/symptoms of physical distress. Continue with 30 min of aerobic exercise without signs/symptoms of physical distress.   Intensity THRR unchanged THRR unchanged THRR unchanged THRR unchanged THRR unchanged     Progression   Progression Continue to progress workloads to maintain intensity without signs/symptoms of physical distress. Continue to progress workloads to maintain intensity without signs/symptoms of physical distress. Continue to progress workloads to maintain intensity without signs/symptoms of physical distress. Continue to progress workloads to maintain intensity without signs/symptoms of physical distress. Continue to progress workloads to maintain intensity without signs/symptoms of physical distress.     Resistance Training   Training Prescription Yes Yes Yes Yes Yes   Weight 4 4 4 4 4    Reps 10-15 10-15 10-15 10-15 10-15   Time 10 Minutes 10 Minutes 10 Minutes 10 Minutes 10 Minutes     Treadmill   MPH 1.8 2.5 2.5 2.4 1.8   Grade 0 0 0 0 0  Minutes 17 17 17 17 17    METs 2.38 2.91 2.91 2.87 2.38     Recumbant Elliptical   Level 1 2 1 1 1    RPM 53 57 54 44 34   Minutes 22 22 22 22 22    METs 3.3 3.8 3 2.3 2.1    Row Name 01/22/22 1400 02/05/22 1500           Response to Exercise   Blood Pressure (Admit) 128/64 106/64       Blood Pressure (Exercise) 132/60 124/68      Blood Pressure (Exit) 108/60 126/64      Heart Rate (Admit) 80 bpm 60 bpm      Heart Rate (Exercise) 90 bpm 98 bpm      Heart Rate (Exit) 85 bpm 62 bpm      Oxygen Saturation (Admit) 94 % 92 %      Oxygen Saturation (Exercise) 94 % 94 %      Oxygen Saturation (Exit) 94 % 92 %      Rating of Perceived Exertion (Exercise) 12 12      Perceived Dyspnea (Exercise) 12 12      Duration Continue with 30 min of aerobic exercise without signs/symptoms of physical distress. Continue with 30 min of aerobic exercise without signs/symptoms of physical distress.      Intensity THRR unchanged THRR unchanged        Progression   Progression Continue to progress workloads to maintain intensity without signs/symptoms of physical distress. Continue to progress workloads to maintain intensity without signs/symptoms of physical distress.        Resistance Training   Training Prescription Yes Yes      Weight 4 4      Reps 10-15 10-15      Time 10 Minutes 10 Minutes        Treadmill   MPH 2.2 2.2      Grade 0 0      Minutes 17 17      METs 2.53 2.53        Recumbant Elliptical   Level 2 2      RPM 36 35      Minutes 22 22      METs 1.6 2               Exercise Comments:   Exercise Goals and Review:   Exercise Goals     Row Name 09/05/21 1315 09/18/21 1425 10/16/21 1355 12/27/21 1334 01/08/22 1601     Exercise Goals   Increase Physical Activity Yes Yes Yes Yes Yes   Intervention Provide advice, education, support and counseling about physical activity/exercise needs.;Develop an individualized exercise prescription for aerobic and resistive training based on initial evaluation findings, risk stratification, comorbidities and participant's personal goals. Provide advice, education, support and counseling about physical activity/exercise needs.;Develop an individualized exercise prescription for aerobic and resistive training based on initial  evaluation findings, risk stratification, comorbidities and participant's personal goals. Provide advice, education, support and counseling about physical activity/exercise needs.;Develop an individualized exercise prescription for aerobic and resistive training based on initial evaluation findings, risk stratification, comorbidities and participant's personal goals. Provide advice, education, support and counseling about physical activity/exercise needs.;Develop an individualized exercise prescription for aerobic and resistive training based on initial evaluation findings, risk stratification, comorbidities and participant's personal goals. Provide advice, education, support and counseling about physical activity/exercise needs.;Develop an individualized exercise prescription for aerobic and resistive training based on initial evaluation findings, risk stratification, comorbidities  and participant's personal goals.   Expected Outcomes Short Term: Attend rehab on a regular basis to increase amount of physical activity.;Long Term: Add in home exercise to make exercise part of routine and to increase amount of physical activity.;Long Term: Exercising regularly at least 3-5 days a week. Short Term: Attend rehab on a regular basis to increase amount of physical activity.;Long Term: Add in home exercise to make exercise part of routine and to increase amount of physical activity.;Long Term: Exercising regularly at least 3-5 days a week. Short Term: Attend rehab on a regular basis to increase amount of physical activity.;Long Term: Add in home exercise to make exercise part of routine and to increase amount of physical activity.;Long Term: Exercising regularly at least 3-5 days a week. Short Term: Attend rehab on a regular basis to increase amount of physical activity.;Long Term: Add in home exercise to make exercise part of routine and to increase amount of physical activity.;Long Term: Exercising regularly at least 3-5  days a week. Short Term: Attend rehab on a regular basis to increase amount of physical activity.;Long Term: Add in home exercise to make exercise part of routine and to increase amount of physical activity.;Long Term: Exercising regularly at least 3-5 days a week.   Increase Strength and Stamina Yes Yes Yes Yes Yes   Intervention Provide advice, education, support and counseling about physical activity/exercise needs.;Develop an individualized exercise prescription for aerobic and resistive training based on initial evaluation findings, risk stratification, comorbidities and participant's personal goals. Provide advice, education, support and counseling about physical activity/exercise needs.;Develop an individualized exercise prescription for aerobic and resistive training based on initial evaluation findings, risk stratification, comorbidities and participant's personal goals. Provide advice, education, support and counseling about physical activity/exercise needs.;Develop an individualized exercise prescription for aerobic and resistive training based on initial evaluation findings, risk stratification, comorbidities and participant's personal goals. Provide advice, education, support and counseling about physical activity/exercise needs.;Develop an individualized exercise prescription for aerobic and resistive training based on initial evaluation findings, risk stratification, comorbidities and participant's personal goals. Provide advice, education, support and counseling about physical activity/exercise needs.;Develop an individualized exercise prescription for aerobic and resistive training based on initial evaluation findings, risk stratification, comorbidities and participant's personal goals.   Expected Outcomes Short Term: Increase workloads from initial exercise prescription for resistance, speed, and METs.;Short Term: Perform resistance training exercises routinely during rehab and add in resistance  training at home;Long Term: Improve cardiorespiratory fitness, muscular endurance and strength as measured by increased METs and functional capacity (6MWT) Short Term: Increase workloads from initial exercise prescription for resistance, speed, and METs.;Short Term: Perform resistance training exercises routinely during rehab and add in resistance training at home;Long Term: Improve cardiorespiratory fitness, muscular endurance and strength as measured by increased METs and functional capacity (6MWT) Short Term: Increase workloads from initial exercise prescription for resistance, speed, and METs.;Short Term: Perform resistance training exercises routinely during rehab and add in resistance training at home;Long Term: Improve cardiorespiratory fitness, muscular endurance and strength as measured by increased METs and functional capacity (6MWT) Short Term: Increase workloads from initial exercise prescription for resistance, speed, and METs.;Short Term: Perform resistance training exercises routinely during rehab and add in resistance training at home;Long Term: Improve cardiorespiratory fitness, muscular endurance and strength as measured by increased METs and functional capacity (6MWT) Short Term: Increase workloads from initial exercise prescription for resistance, speed, and METs.;Short Term: Perform resistance training exercises routinely during rehab and add in resistance training at home;Long Term: Improve cardiorespiratory fitness,  muscular endurance and strength as measured by increased METs and functional capacity (6MWT)   Able to understand and use rate of perceived exertion (RPE) scale Yes Yes Yes Yes Yes   Intervention Provide education and explanation on how to use RPE scale Provide education and explanation on how to use RPE scale Provide education and explanation on how to use RPE scale Provide education and explanation on how to use RPE scale Provide education and explanation on how to use RPE  scale   Expected Outcomes Short Term: Able to use RPE daily in rehab to express subjective intensity level;Long Term:  Able to use RPE to guide intensity level when exercising independently Short Term: Able to use RPE daily in rehab to express subjective intensity level;Long Term:  Able to use RPE to guide intensity level when exercising independently -- Short Term: Able to use RPE daily in rehab to express subjective intensity level;Long Term:  Able to use RPE to guide intensity level when exercising independently Short Term: Able to use RPE daily in rehab to express subjective intensity level;Long Term:  Able to use RPE to guide intensity level when exercising independently   Able to understand and use Dyspnea scale Yes Yes Yes Yes Yes   Intervention Provide education and explanation on how to use Dyspnea scale Provide education and explanation on how to use Dyspnea scale Provide education and explanation on how to use Dyspnea scale Provide education and explanation on how to use Dyspnea scale Provide education and explanation on how to use Dyspnea scale   Expected Outcomes Short Term: Able to use Dyspnea scale daily in rehab to express subjective sense of shortness of breath during exertion;Long Term: Able to use Dyspnea scale to guide intensity level when exercising independently Short Term: Able to use Dyspnea scale daily in rehab to express subjective sense of shortness of breath during exertion;Long Term: Able to use Dyspnea scale to guide intensity level when exercising independently Short Term: Able to use Dyspnea scale daily in rehab to express subjective sense of shortness of breath during exertion;Long Term: Able to use Dyspnea scale to guide intensity level when exercising independently Short Term: Able to use Dyspnea scale daily in rehab to express subjective sense of shortness of breath during exertion;Long Term: Able to use Dyspnea scale to guide intensity level when exercising independently  Short Term: Able to use Dyspnea scale daily in rehab to express subjective sense of shortness of breath during exertion;Long Term: Able to use Dyspnea scale to guide intensity level when exercising independently   Knowledge and understanding of Target Heart Rate Range (THRR) Yes Yes Yes Yes Yes   Intervention Provide education and explanation of THRR including how the numbers were predicted and where they are located for reference Provide education and explanation of THRR including how the numbers were predicted and where they are located for reference Provide education and explanation of THRR including how the numbers were predicted and where they are located for reference Provide education and explanation of THRR including how the numbers were predicted and where they are located for reference Provide education and explanation of THRR including how the numbers were predicted and where they are located for reference   Expected Outcomes Short Term: Able to state/look up THRR;Short Term: Able to use daily as guideline for intensity in rehab;Long Term: Able to use THRR to govern intensity when exercising independently Short Term: Able to state/look up THRR;Short Term: Able to use daily as guideline for intensity in rehab;Long  Term: Able to use THRR to govern intensity when exercising independently Short Term: Able to state/look up THRR;Short Term: Able to use daily as guideline for intensity in rehab;Long Term: Able to use THRR to govern intensity when exercising independently Short Term: Able to state/look up THRR;Short Term: Able to use daily as guideline for intensity in rehab;Long Term: Able to use THRR to govern intensity when exercising independently Short Term: Able to state/look up THRR;Short Term: Able to use daily as guideline for intensity in rehab;Long Term: Able to use THRR to govern intensity when exercising independently   Understanding of Exercise Prescription Yes Yes Yes Yes Yes   Intervention  Provide education, explanation, and written materials on patient's individual exercise prescription Provide education, explanation, and written materials on patient's individual exercise prescription Provide education, explanation, and written materials on patient's individual exercise prescription Provide education, explanation, and written materials on patient's individual exercise prescription Provide education, explanation, and written materials on patient's individual exercise prescription   Expected Outcomes Short Term: Able to explain program exercise prescription;Long Term: Able to explain home exercise prescription to exercise independently Short Term: Able to explain program exercise prescription;Long Term: Able to explain home exercise prescription to exercise independently Short Term: Able to explain program exercise prescription;Long Term: Able to explain home exercise prescription to exercise independently Short Term: Able to explain program exercise prescription;Long Term: Able to explain home exercise prescription to exercise independently Short Term: Able to explain program exercise prescription;Long Term: Able to explain home exercise prescription to exercise independently    [place] Name 02/05/22 1527             Exercise Goals   Increase Physical Activity Yes       Intervention Provide advice, education, support and counseling about physical activity/exercise needs.;Develop an individualized exercise prescription for aerobic and resistive training based on initial evaluation findings, risk stratification, comorbidities and participant's personal goals.       Expected Outcomes Short Term: Attend rehab on a regular basis to increase amount of physical activity.;Long Term: Add in home exercise to make exercise part of routine and to increase amount of physical activity.;Long Term: Exercising regularly at least 3-5 days a week.       Increase Strength and Stamina Yes       Intervention  Provide advice, education, support and counseling about physical activity/exercise needs.;Develop an individualized exercise prescription for aerobic and resistive training based on initial evaluation findings, risk stratification, comorbidities and participant's personal goals.       Expected Outcomes Short Term: Increase workloads from initial exercise prescription for resistance, speed, and METs.;Short Term: Perform resistance training exercises routinely during rehab and add in resistance training at home;Long Term: Improve cardiorespiratory fitness, muscular endurance and strength as measured by increased METs and functional capacity (6MWT)       Able to understand and use rate of perceived exertion (RPE) scale Yes       Intervention Provide education and explanation on how to use RPE scale       Expected Outcomes Short Term: Able to use RPE daily in rehab to express subjective intensity level;Long Term:  Able to use RPE to guide intensity level when exercising independently       Able to understand and use Dyspnea scale Yes       Intervention Provide education and explanation on how to use Dyspnea scale       Expected Outcomes Short Term: Able to use Dyspnea scale daily in rehab to express  subjective sense of shortness of breath during exertion;Long Term: Able to use Dyspnea scale to guide intensity level when exercising independently       Knowledge and understanding of Target Heart Rate Range (THRR) Yes       Intervention Provide education and explanation of THRR including how the numbers were predicted and where they are located for reference       Expected Outcomes Short Term: Able to state/look up THRR;Short Term: Able to use daily as guideline for intensity in rehab;Long Term: Able to use THRR to govern intensity when exercising independently       Understanding of Exercise Prescription Yes       Intervention Provide education, explanation, and written materials on patient's individual  exercise prescription       Expected Outcomes Short Term: Able to explain program exercise prescription;Long Term: Able to explain home exercise prescription to exercise independently                Exercise Goals Re-Evaluation :  Exercise Goals Re-Evaluation     [place] Name 09/18/21 1425 10/16/21 1355 01/08/22 1601 02/05/22 1528       Exercise Goal Re-Evaluation   Exercise Goals Review Increase Physical Activity;Increase Strength and Stamina;Able to understand and use rate of perceived exertion (RPE) scale;Able to understand and use Dyspnea scale;Knowledge and understanding of Target Heart Rate Range (THRR);Understanding of Exercise Prescription Increase Strength and Stamina;Increase Physical Activity;Able to understand and use rate of perceived exertion (RPE) scale;Able to understand and use Dyspnea scale;Knowledge and understanding of Target Heart Rate Range (THRR);Understanding of Exercise Prescription Increase Physical Activity;Increase Strength and Stamina;Able to understand and use rate of perceived exertion (RPE) scale;Able to understand and use Dyspnea scale;Knowledge and understanding of Target Heart Rate Range (THRR);Understanding of Exercise Prescription Increase Physical Activity;Increase Strength and Stamina;Able to understand and use rate of perceived exertion (RPE) scale;Able to understand and use Dyspnea scale;Knowledge and understanding of Target Heart Rate Range (THRR);Understanding of Exercise Prescription    Comments Pt has completed 2 sessions of PR. He seems to enjoy coming to class so far and is eager to get going. He is increasing his speed on the treadmill and RPM on the ellp. He has a bike at home that he rides for 20 minutes a day along with doing weights and bands. He is currenlty exercising at 3.3 METs on the ellp. Will continue to monitor and progress as able. Pt has completed 9 sessions of PR. 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 He is increasing his speed on the  treadmill and RPM on the ellp. He has been tired recently due to [place] but pushes through. He is still exercising at home on days off from class and treatments. He is currently exercising at 3.0 METs on the ellp. Will continue to monitor and progress as able. Pt has completed 3 session of PR. He was ready to get back to class after his surgery. He is progressing each class with his levels. He is currently exercising at 2.38 METs on the treadmill. Will continue to monitor and progress as able. Pt has completed 12 sessions of PR. He continues to progress following his lobectomy. He is motivated to progress. He is currently exercising at 2.53 METs on the TM. Will continue to monitor and progress as able.    Expected Outcomes Through exercise at home and at rehab, the patient will meet their stated goals. Through exercise at home and at rehab, the patient will meet their stated goals.  Through exercise at home and at rehab, the patient will meet their stated goals. Through exercise at home and at rehab, the patient will meet their stated goals.             Discharge Exercise Prescription (Final Exercise Prescription Changes):  Exercise Prescription Changes - 02/05/22 1500       Response to Exercise   Blood Pressure (Admit) 106/64    Blood Pressure (Exercise) 124/68    Blood Pressure (Exit) 126/64    Heart Rate (Admit) 60 bpm    Heart Rate (Exercise) 98 bpm    Heart Rate (Exit) 62 bpm    Oxygen Saturation (Admit) 92 %    Oxygen Saturation (Exercise) 94 %    Oxygen Saturation (Exit) 92 %    Rating of Perceived Exertion (Exercise) 12    Perceived Dyspnea (Exercise) 12    Duration Continue with 30 min of aerobic exercise without signs/symptoms of physical distress.    Intensity THRR unchanged      Progression   Progression Continue to progress workloads to maintain intensity without signs/symptoms of physical distress.      Resistance Training   Training Prescription Yes    Weight 4     Reps 10-15    Time 10 Minutes      Treadmill   MPH 2.2    Grade 0    Minutes 17    METs 2.53      Recumbant Elliptical   Level 2    RPM 35    Minutes 22    METs 2             Nutrition:  Target Goals: Understanding of nutrition guidelines, daily intake of sodium 1500mg , cholesterol 200mg , calories 30% from fat and 7% or less from saturated fats, daily to have 5 or more servings of fruits and vegetables.  Biometrics:  Pre Biometrics - 12/27/21 1335       Pre Biometrics   Height 6' (1.829 m)    Weight 81.5 kg    Waist Circumference 40 inches    Hip Circumference 39 inches    Waist to Hip Ratio 1.03 %    BMI (Calculated) 24.36    Triceps Skinfold 10 mm    % Body Fat 24.7 %    Grip Strength 28.6 kg    Flexibility 0 in    Single Leg Stand 0 seconds              Nutrition Therapy Plan and Nutrition Goals:  Nutrition Therapy & Goals - 12/27/21 1314       Personal Nutrition Goals   Comments Patient scored 43 on his diet assessment. Handout provided and explained regarding healthier choices. We offer 2 educational sessions on heart healthy nutrition and assistance with RD referral if patient is interested.      Intervention Plan   Intervention Nutrition handout(s) given to patient.    Expected Outcomes Short Term Goal: Understand basic principles of dietary content, such as calories, fat, sodium, cholesterol and nutrients.             Nutrition Assessments:  Nutrition Assessments - 12/27/21 1314       MEDFICTS Scores   Pre Score 43            MEDIFICTS Score Key: ?70 Need to make dietary changes  40-70 Heart Healthy Diet ? 40 Therapeutic Level Cholesterol Diet   Picture Your Plate Scores: <16 Unhealthy dietary pattern with much room for improvement. 41-50  Dietary pattern unlikely to meet recommendations for good health and room for improvement. 51-60 More healthful dietary pattern, with some room for improvement.  >60 Healthy dietary  pattern, although there may be some specific behaviors that could be improved.    Nutrition Goals Re-Evaluation:   Nutrition Goals Discharge (Final Nutrition Goals Re-Evaluation):   Psychosocial: Target Goals: Acknowledge presence or absence of significant depression and/or stress, maximize coping skills, provide positive support system. Participant is able to verbalize types and ability to use techniques and skills needed for reducing stress and depression.  Initial Review & Psychosocial Screening:  Initial Psych Review & Screening - 12/27/21 1317       Initial Review   Current issues with None Identified      Family Dynamics   Good Support System? Yes      Barriers   Psychosocial barriers to participate in program There are no identifiable barriers or psychosocial needs.      Screening Interventions   Interventions Encouraged to exercise;Provide feedback about the scores to participant    Expected Outcomes Short Term goal: Identification and review with participant of any Quality of Life or Depression concerns found by scoring the questionnaire.             Quality of Life Scores:  Quality of Life - 12/27/21 1338       Quality of Life   Select Quality of Life      Quality of Life Scores   Health/Function Pre 27.48 %    Socioeconomic Pre 24.81 %    Psych/Spiritual Pre 30 %    Family Pre 30 %    GLOBAL Pre 30 %            Scores of 19 and below usually indicate a poorer quality of life in these areas.  A difference of  2-3 points is a clinically meaningful difference.  A difference of 2-3 points in the total score of the Quality of Life Index has been associated with significant improvement in overall quality of life, self-image, physical symptoms, and general health in studies assessing change in quality of life.   PHQ-9: Review Flowsheet  More data exists      12/27/2021 09/24/2021 09/05/2021 08/29/2021 09/18/2020  Depression screen PHQ 2/9  Decreased  Interest 0 0 0 0 0  Down, Depressed, Hopeless 0 0 0 0 0  PHQ - 2 Score 0 0 0 0 0  Altered sleeping 0 0 0 0 -  Tired, decreased energy 0 0 0 0 -  Change in appetite 0 0 0 0 -  Feeling bad or failure about yourself  0 0 0 0 -  Trouble concentrating 0 0 0 0 -  Moving slowly or fidgety/restless 0 0 0 0 -  Suicidal thoughts 0 0 0 0 -  PHQ-9 Score 0 0 0 0 -  Difficult doing work/chores - Not difficult at all - Not difficult at all -   Interpretation of Total Score  Total Score Depression Severity:  1-4 = Minimal depression, 5-9 = Mild depression, 10-14 = Moderate depression, 15-19 = Moderately severe depression, 20-27 = Severe depression   Psychosocial Evaluation and Intervention:  Psychosocial Evaluation - 12/27/21 1318       Psychosocial Evaluation & Interventions   Interventions Encouraged to exercise with the program and follow exercise prescription;Stress management education;Relaxation education    Comments Patient has no psychosocial barriers identified to participate in PR at his orientation visit. His PHQ-9 score was 0.  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 He has cancer in his left lung and 18 out of 20 lymph nodes taken during the surgery were postive for cancer. Patient says he was told his case is complicated and they have not decided how to treat the remaining cancer moving forward. He says they have been talking about immunotherapy but no decision has been made. He says he is more SOB now than before his surgery. He hopes this will improve. He has a good support system with his wife. He is ready to start the program hoping to get back to his normal strength level.    Expected Outcomes Pt will continue to have no identifiable psychosocial issues.    Continue Psychosocial Services  No Follow up required             Psychosocial Re-Evaluation:  Psychosocial Re-Evaluation     [place] Name 09/19/21 1133 10/08/21  1507 01/04/22 0844 01/28/22 1501       Psychosocial Re-Evaluation   Current issues with None Identified None Identified None Identified None Identified    Comments Patient is new to the program completing 3 sessions. He continues to have no psychosocial barriers or issues identified. We will continue to monitor. Patient has completed 7 sessions.  He continues to have no psychosocial barriers or issues identified.  He seems to enjoy coming to class and very interactiver with class and staff.  He demonstrates an interest in improving his health.   We will continue to monitor as he progresses in the program. Patient has started PR over, due to having surgery.  He has attended 2 sessions.  He was referred via Dr. Williemae Natter with Lung cancer.  He continues to have no psychosocial barriers or issues identified.  He seems to enjoy coming to class and demonstrates a very positive outlook with class and staff.  He demonstrates an interest in improving his health.   We will continue to monitor as he progresses in the program. Patient has attended 8 sessions and continues to have no psychosocial issues identified.    He was referred via Dr. Williemae Natter with Lung cancer.   He continues to have no psychosocial barriers or issues identified.  He seems to enjoy coming to class and demonstrates a very positive outlook and very interative with others in class and staff.  He demonstrates an interest in improving his health.   We will continue to monitor as he progresses in the program.    Expected Outcomes Patient will continue to have no psychosocial barriers or issues identified. Patient will continue to have no psychosocial barriers or issues identified. Patient will continue to have no psychosocial barriers or issues identified. Patient will continue to have no psychosocial barriers or issues identified.    Interventions Encouraged to attend Pulmonary Rehabilitation for the exercise;Relaxation education;Encouraged to attend Cardiac  Rehabilitation for the exercise Encouraged to attend Pulmonary Rehabilitation for the exercise;Relaxation education;Encouraged to attend Cardiac Rehabilitation for the exercise Encouraged to attend Pulmonary Rehabilitation for the exercise;Relaxation education;Encouraged to attend Cardiac Rehabilitation for the exercise Encouraged to attend Pulmonary Rehabilitation for the exercise;Relaxation education;Encouraged to attend Cardiac Rehabilitation for the exercise    Continue Psychosocial Services  No Follow up required No Follow up required No Follow up required No Follow up required             Psychosocial Discharge (Final Psychosocial Re-Evaluation):  Psychosocial Re-Evaluation - 01/28/22 1501  Psychosocial Re-Evaluation   Current issues with None Identified    Comments Patient has attended 8 sessions and continues to have no psychosocial issues identified.    He was referred via Dr. Williemae Natter with Lung cancer.   He continues to have no psychosocial barriers or issues identified.  He seems to enjoy coming to class and demonstrates a very positive outlook and very interative with others in class and staff.  He demonstrates an interest in improving his health.   We will continue to monitor as he progresses in the program.    Expected Outcomes Patient will continue to have no psychosocial barriers or issues identified.    Interventions Encouraged to attend Pulmonary Rehabilitation for the exercise;Relaxation education;Encouraged to attend Cardiac Rehabilitation for the exercise    Continue Psychosocial Services  No Follow up required              Education: Education Goals: Education classes will be provided on a weekly basis, covering required topics. Participant will state understanding/return demonstration of topics presented.  Learning Barriers/Preferences:  Learning Barriers/Preferences - 12/27/21 1315       Learning Barriers/Preferences   Learning Barriers None    Learning  Preferences Audio;Written Material             Education Topics: How Lungs Work and Diseases: - Discuss the anatomy of the lungs and diseases that can affect the lungs, such as COPD. Flowsheet Row PULMONARY REHAB OTHER RESPIRATORY from 01/31/2022 in [place]  Date 10/11/21  Educator DF  Instruction Review Code 2- Demonstrated Understanding       Exercise: -Discuss the importance of exercise, FITT principles of exercise, normal and abnormal responses to exercise, and how to exercise safely.   Environmental Irritants: -Discuss types of environmental irritants and how to limit exposure to environmental irritants. Flowsheet Row PULMONARY REHAB OTHER RESPIRATORY from 01/31/2022 in [place]  Date 10/18/21  Instruction Review Code 1- Verbalizes Understanding       Meds/Inhalers and oxygen: - Discuss respiratory medications, definition of an inhaler and oxygen, and the proper way to use an inhaler and oxygen. Flowsheet Row PULMONARY REHAB OTHER RESPIRATORY from 01/31/2022 in [place]  Date 01/24/22  Educator HB       Energy Saving Techniques: - Discuss methods to conserve energy and decrease shortness of breath when performing activities of daily living.  Flowsheet Row PULMONARY REHAB OTHER RESPIRATORY from 01/31/2022 in [place]  Date 11/01/21  Educator HB  Instruction Review Code 1- Verbalizes Understanding       Bronchial Hygiene / Breathing Techniques: - Discuss breathing mechanics, pursed-lip breathing technique,  proper posture, effective ways to clear airways, and other functional breathing techniques Flowsheet Row PULMONARY REHAB OTHER RESPIRATORY from 01/31/2022 in [place]  Date 11/08/21  Educator HB  Instruction Review Code 1- Research scientist (medical): - Provides group verbal and written instruction about the  health risks of elevated stress, cause of high stress, and healthy ways to reduce stress.   Nutrition I: Fats: - Discuss the types of cholesterol, what cholesterol does to the body, and how cholesterol levels can be controlled.   Nutrition II: Labels: -Discuss the different components of food labels and how to read food labels.   Respiratory Infections: - Discuss the signs and symptoms of respiratory infections, ways to prevent respiratory infections, and the importance of seeking medical treatment when having a respiratory infection.  Flowsheet Row PULMONARY REHAB OTHER RESPIRATORY from 01/31/2022 in [place]  Date 09/13/21  Educator DM  Instruction Review Code 1- Verbalizes Understanding       Stress I: Signs and Symptoms: - Discuss the causes of stress, how stress may lead to anxiety and depression, and ways to limit stress. Flowsheet Row PULMONARY REHAB OTHER RESPIRATORY from 01/31/2022 in [place]  Date 09/20/21  Educator [place]  Instruction Review Code 1- Verbalizes Understanding       Stress II: Relaxation: -Discuss relaxation techniques to limit stress. Flowsheet Row PULMONARY REHAB OTHER RESPIRATORY from 01/31/2022 in [place]  Date 09/27/21  Educator pb  Instruction Review Code 1- Verbalizes Understanding       Oxygen for Home/Travel: - Discuss how to prepare for travel when on oxygen and proper ways to transport and store oxygen to ensure safety. Flowsheet Row PULMONARY REHAB OTHER RESPIRATORY from 01/31/2022 in [place]  Date 10/04/21  Educator DF  Instruction Review Code 2- Demonstrated Understanding       Knowledge Questionnaire Score:  Knowledge Questionnaire Score - 12/27/21 1313       Knowledge Questionnaire Score   Pre Score 15/18             Core Components/Risk Factors/Patient Goals at Admission:  Personal Goals and Risk Factors at Admission -  12/27/21 1315       Core Components/Risk Factors/Patient Goals on Admission    Weight Management Weight Maintenance    Improve shortness of breath with ADL's Yes    Intervention Provide education, individualized exercise plan and daily activity instruction to help decrease symptoms of SOB with activities of daily living.    Expected Outcomes Short Term: Improve cardiorespiratory fitness to achieve a reduction of symptoms when performing ADLs;Long Term: Be able to perform more ADLs without symptoms or delay the onset of symptoms    Increase knowledge of respiratory medications and ability to use respiratory devices properly  Yes    Intervention Provide education and demonstration as needed of appropriate use of medications, inhalers, and oxygen therapy.    Expected Outcomes Short Term: Achieves understanding of medications use. Understands that oxygen is a medication prescribed by physician. Demonstrates appropriate use of inhaler and oxygen therapy.;Long Term: Maintain appropriate use of medications, inhalers, and oxygen therapy.    Personal Goal Other Yes    Personal Goal Patient wants to be able to return to his normal strength.    Intervention Patient will attend PR 2 days/week with education and exercise.    Expected Outcomes Patient will complete the program meeting both personal and program goals.             Core Components/Risk Factors/Patient Goals Review:   Goals and Risk Factor Review     Row Name 09/19/21 1134 10/08/21 1514 01/04/22 0853 01/28/22 1504       Core Components/Risk Factors/Patient Goals Review   Personal Goals Review Other Other Other Other    Review Patient was referred to PR with malignant neoplasm of Left upper lobe of lung. He has completed 3 sessions and is doing well in the program with consistent attendance. His personal goals for the program are to get stronger and prepare his lungs for surgery for cancer. He is currently receiving chemotherapy. We will  continue to monitor his progress as he works towards meeting these personal goals. Patient has completed 7 sessions and is doing well in the program with consistent attendance. His  personal goals for the program are to get stronger and prepare his lungs for surgery for cancer. He is currently receiving chemotherapy.  He is tolerating exercise well on the Tm and recumbent elliptical and his O2 sats averaging around 93%-96% while exercising on room air.  We will continue to encourage his progress as he continues to works towards meeting these personal goals. Patient has started PR over, due to surgery.  He attended back in October and restarting over.  He has attended 2 sessions and is doing well in the program.  His personal goals for the program are to get stronger and get back to his normal strength level.  He is tolerating exercise well on the Tm and recumbent elliptical and his O2 sats averaging around 94%-95% while exercising on room air.  We will continue to encourage his progress as he continues to works towards meeting these personal goals. Patient has attended 8 sessions and is doing well in the program.  His personal goals for the program are to get stronger and get back to his normal strength level.  He is tolerating exercise well on the Tm and recumbent elliptical and his O2 sats averaging around 94%-98% while exercising and no need for supplemenetal oxygen at this time.  We will help with develping effiecient breathing techniques such as purse lipped breathing and practice self pacing with increased activity.   We will continue to encourage his progress as he continues to works towards meeting these personal goals.    Expected Outcomes Patient will complete the program meeting both personal and program goals. Patient will complete the program meeting both personal and program goals. Patient will complete the program meeting both personal and program goals. Patient will complete the program meeting both  personal and program goals.             Core Components/Risk Factors/Patient Goals at Discharge (Final Review):   Goals and Risk Factor Review - 01/28/22 1504       Core Components/Risk Factors/Patient Goals Review   Personal Goals Review Other    Review Patient has attended 8 sessions and is doing well in the program.  His personal goals for the program are to get stronger and get back to his normal strength level.  He is tolerating exercise well on the Tm and recumbent elliptical and his O2 sats averaging around 94%-98% while exercising and no need for supplemenetal oxygen at this time.  We will help with develping effiecient breathing techniques such as purse lipped breathing and practice self pacing with increased activity.   We will continue to encourage his progress as he continues to works towards meeting these personal goals.    Expected Outcomes Patient will complete the program meeting both personal and program goals.             ITP Comments:  ITP Comments     Row Name 11/19/21 1455           ITP Comments Pt discharged from PR on 11/2/023 after 16 sessions. He was participating in [place] to prepare his body for his lobectomy to remove cancer from his lung. He underwent lobectomy surgery on 11/13/2021 at [facility]. I explained to pt that if he would like to do PR again after he has recovered from his surgery, that he will need to get another referral from his doctor.                Comments: ITP REVIEW Pt is making expected progress  toward pulmonary rehab goals after completing 12 sessions. Recommend continued exercise, life style modification, education, and utilization of breathing techniques to increase stamina and strength and decrease shortness of breath with exertion.

## 2022-02-07 ENCOUNTER — Encounter (HOSPITAL_COMMUNITY)
Admission: RE | Admit: 2022-02-07 | Discharge: 2022-02-07 | Disposition: A | Discharge status: Home / Self Care | Payer: Medicare Other | Source: Ambulatory Visit | Attending: Oncology | Admitting: Oncology

## 2022-02-07 DIAGNOSIS — C3412 Malignant neoplasm of upper lobe, left bronchus or lung: Secondary | ICD-10-CM | POA: Diagnosis not present

## 2022-02-07 DIAGNOSIS — C3411 Malignant neoplasm of upper lobe, right bronchus or lung: Secondary | ICD-10-CM | POA: Insufficient documentation

## 2022-02-07 NOTE — Progress Notes (Signed)
Daily Session Note  Patient Details  Name: Patrick Harris MRN: [id number] Date of Birth: [birth year] Referring Provider:   [place] from 12/27/2021 in [place]  Referring Provider Dr. Williemae Natter       Encounter Date: 02/07/2022  Check In:  Session Check In - 02/07/22 1330       Check-In   Supervising physician immediately available to respond to emergencies [facility] MD immediately available    Physician(s) Dr Dellia Cloud    Location [facility]    Staff Present Hoy Register MHA, MS, ACSM-CEP;Dejon Jungman BSN, RN;Daphyne Hassell Done, RN, BSN    Virtual Visit No    Medication changes reported     No    Fall or balance concerns reported    No    Tobacco Cessation No Change    Warm-up and Cool-down Performed as group-led Higher education careers adviser Performed Yes    VAD Patient? No    PAD/SET Patient? No      Pain Assessment   Currently in Pain? No/denies    Pain Score 0-No pain    Multiple Pain Sites No             Capillary Blood Glucose: No results found for this or any previous visit (from the past 24 hour(s)).    Social History   Tobacco Use  Smoking Status Some Days   Types: Cigarettes   Last attempt to quit: 01/07/1998   Years since quitting: 24.1  Smokeless Tobacco Never  Tobacco Comments   Smokes "every once in a while"   07/25/21 [facility]     Goals Met:  Proper associated with RPD/PD & O2 Sat Independence with exercise equipment Using PLB without cueing & demonstrates good technique Exercise tolerated well Queuing for purse lip breathing No report of concerns or symptoms today Strength training completed today  Goals Unmet:  Not Applicable  Comments: check out at 14:30   Dr. Kathie Dike is Medical Director for [facility] Pulmonary Rehab.

## 2022-02-11 ENCOUNTER — Other Ambulatory Visit: Payer: Self-pay | Admitting: *Deleted

## 2022-02-11 DIAGNOSIS — C3492 Malignant neoplasm of unspecified part of left bronchus or lung: Secondary | ICD-10-CM | POA: Diagnosis not present

## 2022-02-11 DIAGNOSIS — I739 Peripheral vascular disease, unspecified: Secondary | ICD-10-CM

## 2022-02-12 ENCOUNTER — Encounter (HOSPITAL_COMMUNITY)
Admission: RE | Admit: 2022-02-12 | Discharge: 2022-02-12 | Disposition: A | Discharge status: Home / Self Care | Payer: Medicare Other | Source: Ambulatory Visit | Attending: Thoracic Surgery (Cardiothoracic Vascular Surgery) | Admitting: Thoracic Surgery (Cardiothoracic Vascular Surgery)

## 2022-02-12 DIAGNOSIS — C3411 Malignant neoplasm of upper lobe, right bronchus or lung: Secondary | ICD-10-CM

## 2022-02-12 DIAGNOSIS — C3412 Malignant neoplasm of upper lobe, left bronchus or lung: Secondary | ICD-10-CM | POA: Diagnosis not present

## 2022-02-12 NOTE — Progress Notes (Signed)
Daily Session Note  Patient Details  Name: Patrick Harris MRN: [id number] Date of Birth: November 03, 1945 Referring Provider:   [place] from 12/27/2021 in [place]  Referring Provider Dr. Williemae Natter       Encounter Date: 02/12/2022  Check In:  Session Check In - 02/12/22 1330       Check-In   Supervising physician immediately available to respond to emergencies [facility] MD immediately available    Physician(s) Dr Harl Bowie    Staff Present Brianna Esson Hassell Done, RN, BSN;Heather Mel Almond, BS, Exercise Physiologist;Phyllis Billingsley, RN    Virtual Visit No    Medication changes reported     No    Fall or balance concerns reported    No    Tobacco Cessation No Change    Warm-up and Cool-down Performed as group-led instruction    Resistance Training Performed Yes    VAD Patient? No    PAD/SET Patient? No      Pain Assessment   Currently in Pain? No/denies    Pain Score 0-No pain    Multiple Pain Sites No             Capillary Blood Glucose: No results found for this or any previous visit (from the past 24 hour(s)).    Social History   Tobacco Use  Smoking Status Some Days   Types: Cigarettes   Last attempt to quit: 01/07/1998   Years since quitting: 24.1  Smokeless Tobacco Never  Tobacco Comments   Smokes "every once in a while"   07/25/21 ARJ     Goals Met:  Proper associated with RPD/PD & O2 Sat Independence with exercise equipment Using PLB without cueing & demonstrates good technique Exercise tolerated well Queuing for purse lip breathing No report of concerns or symptoms today Strength training completed today  Goals Unmet:  Not Applicable  Comments: Checkout at 1430.   Dr. Kathie Dike is Medical Director for [facility] Pulmonary Rehab.

## 2022-02-14 ENCOUNTER — Encounter (HOSPITAL_COMMUNITY)
Admission: RE | Admit: 2022-02-14 | Discharge: 2022-02-14 | Disposition: A | Discharge status: Home / Self Care | Payer: Medicare Other | Source: Ambulatory Visit | Attending: Thoracic Surgery (Cardiothoracic Vascular Surgery) | Admitting: Thoracic Surgery (Cardiothoracic Vascular Surgery)

## 2022-02-14 DIAGNOSIS — C3412 Malignant neoplasm of upper lobe, left bronchus or lung: Secondary | ICD-10-CM | POA: Diagnosis not present

## 2022-02-14 DIAGNOSIS — C3411 Malignant neoplasm of upper lobe, right bronchus or lung: Secondary | ICD-10-CM | POA: Diagnosis not present

## 2022-02-14 NOTE — Progress Notes (Signed)
Daily Session Note  Patient Details  Name: Patrick Harris MRN: [id number] Date of Birth: 10-Feb-1945 Referring Provider:   [place] from 12/27/2021 in [place]  Referring Provider Dr. Williemae Natter       Encounter Date: 02/14/2022  Check In:  Session Check In - 02/14/22 1334       Check-In   Supervising physician immediately available to respond to emergencies [facility] MD immediately available    Physician(s) Dr Harl Bowie    Location [facility]    Staff Present Raela Bohl Hassell Done, RN, BSN;Heather Mel Almond, BS, Exercise Physiologist;Debra Wynetta Emery, RN, BSN    Virtual Visit No    Medication changes reported     No    Fall or balance concerns reported    No    Tobacco Cessation No Change    Warm-up and Cool-down Performed as group-led instruction    Resistance Training Performed Yes    VAD Patient? No    PAD/SET Patient? No      Pain Assessment   Currently in Pain? No/denies    Pain Score 0-No pain    Multiple Pain Sites No             Capillary Blood Glucose: No results found for this or any previous visit (from the past 24 hour(s)).    Social History   Tobacco Use  Smoking Status Some Days   Types: Cigarettes   Last attempt to quit: 01/07/1998   Years since quitting: 24.1  Smokeless Tobacco Never  Tobacco Comments   Smokes "every once in a while"   07/25/21 [facility]J     Goals Met:  Proper associated with RPD/PD & O2 Sat Independence with exercise equipment Using PLB without cueing & demonstrates good technique Changing diet to healthy choices, watching portion sizes Queuing for purse lip breathing No report of concerns or symptoms today Strength training completed today  Goals Unmet:  Not Applicable  Comments: Checkout at 1430.   Dr. Kathie Dike is Medical Director for [facility] Pulmonary Rehab.

## 2022-02-18 DIAGNOSIS — H938X2 Other specified disorders of left ear: Secondary | ICD-10-CM | POA: Diagnosis not present

## 2022-02-18 DIAGNOSIS — K219 Gastro-esophageal reflux disease without esophagitis: Secondary | ICD-10-CM | POA: Diagnosis not present

## 2022-02-19 ENCOUNTER — Encounter (HOSPITAL_COMMUNITY)
Admission: RE | Admit: 2022-02-19 | Discharge: 2022-02-19 | Disposition: A | Discharge status: Home / Self Care | Payer: Medicare Other | Source: Ambulatory Visit | Attending: Thoracic Surgery (Cardiothoracic Vascular Surgery) | Admitting: Thoracic Surgery (Cardiothoracic Vascular Surgery)

## 2022-02-19 VITALS — Wt 187.6 lb

## 2022-02-19 DIAGNOSIS — C3411 Malignant neoplasm of upper lobe, right bronchus or lung: Secondary | ICD-10-CM | POA: Diagnosis not present

## 2022-02-19 DIAGNOSIS — C3412 Malignant neoplasm of upper lobe, left bronchus or lung: Secondary | ICD-10-CM

## 2022-02-19 NOTE — Progress Notes (Signed)
Daily Session Note  Patient Details  Name: Patrick Harris MRN: [id number] Date of Birth: 1945-06-26 Referring Provider:   [place] from 12/27/2021 in [place]  Referring Provider Dr. Williemae Natter       Encounter Date: 02/19/2022  Check In:  Session Check In - 02/19/22 1330       Check-In   Supervising physician immediately available to respond to emergencies [facility] MD immediately available    Physician(s) Dr Harl Bowie    Location [facility]    Staff Present Leyna Vanderkolk Hassell Done, RN, BSN;Heather Mel Almond, BS, Exercise Physiologist;Phyllis Billingsley, RN;Dalton Sherrie George, MS, ACSM-CEP    Virtual Visit No    Medication changes reported     No    Fall or balance concerns reported    No    Tobacco Cessation No Change    Warm-up and Cool-down Performed as group-led instruction    Resistance Training Performed Yes    VAD Patient? No    PAD/SET Patient? No      Pain Assessment   Currently in Pain? No/denies    Pain Score 0-No pain    Multiple Pain Sites No             Capillary Blood Glucose: No results found for this or any previous visit (from the past 24 hour(s)).    Social History   Tobacco Use  Smoking Status Some Days   Types: Cigarettes   Last attempt to quit: 01/07/1998   Years since quitting: 24.1  Smokeless Tobacco Never  Tobacco Comments   Smokes "every once in a while"   07/25/21 [facility]     Goals Met:  Proper associated with RPD/PD & O2 Sat Independence with exercise equipment Using PLB without cueing & demonstrates good technique Exercise tolerated well Queuing for purse lip breathing No report of concerns or symptoms today Strength training completed today  Goals Unmet:  Not Applicable  Comments: Checkout at 1430.   Dr. Kathie Dike is Medical Director for [facility] Pulmonary Rehab.

## 2022-02-20 ENCOUNTER — Ambulatory Visit (INDEPENDENT_AMBULATORY_CARE_PROVIDER_SITE_OTHER): Payer: Medicare Other

## 2022-02-20 ENCOUNTER — Ambulatory Visit
Admission: EM | Admit: 2022-02-20 | Discharge: 2022-02-20 | Disposition: A | Discharge status: Home / Self Care | Payer: Medicare Other | Attending: Internal Medicine | Admitting: Internal Medicine

## 2022-02-20 DIAGNOSIS — R0602 Shortness of breath: Secondary | ICD-10-CM | POA: Diagnosis not present

## 2022-02-20 DIAGNOSIS — R911 Solitary pulmonary nodule: Secondary | ICD-10-CM | POA: Diagnosis not present

## 2022-02-20 DIAGNOSIS — J029 Acute pharyngitis, unspecified: Secondary | ICD-10-CM | POA: Diagnosis not present

## 2022-02-20 DIAGNOSIS — R0902 Hypoxemia: Secondary | ICD-10-CM | POA: Diagnosis not present

## 2022-02-20 LAB — POCT RAPID STREP A (OFFICE): Rapid Strep A Screen: NEGATIVE

## 2022-02-20 MED ORDER — AMOXICILLIN 500 MG PO CAPS: 500.0000 mg | ORAL_CAPSULE | Freq: Two times a day (BID) | ORAL | 0 refills | Status: DC

## 2022-02-20 NOTE — Discharge Instructions (Addendum)
The rapid strep test is negative, but your throat looks pretty red and I ill start you on antibiotic until the throat culture comes back in case this one did not pick up the strep.  You may use Tylenol for throat pain, gargle with warm salt water 2-3 times a day Your chest xray is stable and does not show signs of infection   If you develop runny nose, headache, body aches please come back since you may need Flu and Covid testing

## 2022-02-20 NOTE — ED Triage Notes (Signed)
Pt reports his throat felt like it was on fire, feels hard to swallow and tight and voice changes, has green mucus from both his nose and mouth x 1 day  Pt reports his chest feels a little tighter than normal. He did have a lobe removed from right lung 11/23 but states the slight  breathing difficulty started today.

## 2022-02-20 NOTE — ED Provider Notes (Signed)
[facility]    CSN: [id number] Arrival date & time: 02/20/22  1105      History   Chief Complaint Chief Complaint  Patient presents with   Sore Throat    HPI Patrick Harris is a 77 y.o. male who presents with onset of burning ST since yesterday. This am he blew green mocous from his L nose, his voice is a little horsed, and is a little more SOB than usual. Had R lobectomy 11/'23 and has one more tumor present which will be scoped with a bronch in a few weeks.     Past Medical History:  Diagnosis Date   Arthritis    CAD (coronary artery disease)    CABG with LIMA to LAD and RIMA to RCA August 2000 - Dr. Roxan Hockey   Cancer [facility])    basal cell   CHF (congestive heart failure) (HCC)    COPD (chronic obstructive pulmonary disease) (HCC)    Elevated glycated hemoglobin    Essential hypertension    GERD (gastroesophageal reflux disease)    Heart murmur    Hyperlipidemia    Hypopituitarism ([place])    Lung cancer ([place])    Osteopenia    Dr Wilson Singer   Pancreatitis    Peyronie disease    Testosterone deficiency    Dr Wilson Singer    Patient Active Problem List   Diagnosis Date Noted   Globus sensation 01/10/2022   Dysfunction of left eustachian tube 01/10/2022   Non-small cell cancer of right lung ([place]) 08/23/2021   Pulmonary nodules 06/19/2021   PAD (peripheral artery disease) ([place]) 04/20/2021   Nausea 04/20/2021   Aortic atherosclerosis ([place]) 08/25/2019   Inguinal hernia 08/25/2019   Chronic periumbilical pain 92/11/9415   Claudication ([place]) 08/24/2018   Neck pain 03/02/2018   Neuralgia 03/02/2018   Prediabetes 40/81/4481   Umbilical hernia 85/63/1497   Varicose veins of both lower extremities 05/22/2016   Vitamin D deficiency 08/21/2015   Hypertension 01/24/2015   Tingling in extremities 01/24/2015   Family history of prostate cancer 08/19/2014   Osteopenia 08/07/2009   Hyperlipidemia 08/12/2007   PITUITARY INSUFFICIENCY 08/05/2007   Testosterone  deficiency 08/05/2007   Coronary atherosclerosis 08/05/2007   GERD 08/05/2007   BPH without obstruction/lower urinary tract symptoms 08/05/2007    Past Surgical History:  Procedure Laterality Date   Bone spur  2006   5th toe bilaterally   BRONCHIAL BIOPSY  07/02/2021   Procedure: BRONCHIAL BIOPSIES;  Surgeon: Collene Gobble, MD;  Location: [place];  Service: Pulmonary;;   BRONCHIAL BRUSHINGS  07/02/2021   Procedure: BRONCHIAL BRUSHINGS;  Surgeon: Collene Gobble, MD;  Location: [place];  Service: Pulmonary;;   BRONCHIAL NEEDLE ASPIRATION BIOPSY  07/02/2021   Procedure: BRONCHIAL NEEDLE ASPIRATION BIOPSIES;  Surgeon: Collene Gobble, MD;  Location: [place];  Service: Pulmonary;;   COLONOSCOPY  2007   negative; Dr Sharlett Iles   CORONARY ARTERY BYPASS GRAFT  2001   2 vessels   FIDUCIAL MARKER PLACEMENT  07/02/2021   Procedure: FIDUCIAL MARKER PLACEMENT;  Surgeon: Collene Gobble, MD;  Location: [facility];  Service: Pulmonary;;   fracture heel  1967   Bil/ due to fall off ladder   Fractured calcaneus  1969   bilaterally w/ ankle fusion   [place]   right inguinal hernia   KNEE SURGERY Left    LOBECTOMY Right 11/13/2021   MENISECTOMY  2007   R medial, Dr. Jeannetta Nap CATH INSERTION  SHOULDER SURGERY  1994   left   UPPER GASTROINTESTINAL ENDOSCOPY  2007   GERD, Dr Sharlett Iles   VASECTOMY     VIDEO BRONCHOSCOPY WITH RADIAL ENDOBRONCHIAL ULTRASOUND  07/02/2021   Procedure: VIDEO BRONCHOSCOPY WITH RADIAL ENDOBRONCHIAL ULTRASOUND;  Surgeon: Collene Gobble, MD;  Location: [place] [facility];  Service: Pulmonary;;       Home Medications    Prior to Admission medications   Medication Sig Start Date End Date Taking? Authorizing Provider  amoxicillin (AMOXIL) 500 MG capsule Take 1 capsule (500 mg total) by mouth 2 (two) times daily. 02/20/22  Yes Rodriguez-Southworth, Sunday Spillers, PA-C  acetaminophen (TYLENOL) 500 MG tablet Take 500 mg by mouth every 6 (six)  hours as needed (for pain.).    [provider]  aspirin EC 81 MG tablet Take 1 tablet (81 mg total) by mouth at bedtime. Okay to restart this medication on 07/03/2021 07/02/21   Collene Gobble, MD  Cholecalciferol (VITAMIN D3) 2000 UNITS TABS Take 2,000 Units by mouth in the morning and at bedtime.    [provider]  Coenzyme Q10 (COQ-10) 100 MG CAPS Take 100 mg by mouth in the morning.    [provider]  Cyanocobalamin (B-12 PO) Take 1 tablet by mouth daily.    [provider]  hydrALAZINE (APRESOLINE) 25 MG tablet Take 1 tablet (25 mg total) by mouth 3 (three) times daily. 08/29/21   [name], MD  irbesartan (AVAPRO) 300 MG tablet Take 1 tablet (300 mg total) by mouth daily. 08/29/21   [name], MD  nitroGLYCERIN (NITROSTAT) 0.4 MG SL tablet Place 1 tablet (0.4 mg total) under the tongue every 5 (five) minutes x 3 doses as needed for chest pain (if no relief after 2nd dose, proceed to ED or call 911). 02/04/22   Satira Sark, MD  Omega-3 Fatty Acids (OMEGA-3 FISH OIL PO) Take 1,000 mg by mouth in the morning.    [provider]  omeprazole (PRILOSEC) 40 MG capsule Take 1 capsule (40 mg total) by mouth in the morning. 01/10/22   [name], MD  ondansetron (ZOFRAN) 8 MG tablet Take 8 mg by mouth every 8 (eight) hours as needed for vomiting or nausea. 09/06/21   [provider]  Polyethyl Glycol-Propyl Glycol (LUBRICANT EYE DROPS) 0.4-0.3 % SOLN Place 1-2 drops into both eyes 3 (three) times daily as needed (dry/irritated eyes.).    [provider]  rosuvastatin (CRESTOR) 20 MG tablet TAKE HALF A TABLET (10 MG) BY MOUTH ONCE DAILY. 10/08/21   [name], MD  tadalafil (CIALIS) 5 MG tablet Take 5 mg by mouth daily as needed for erectile dysfunction.    [provider]  tamsulosin (FLOMAX) 0.4 MG CAPS capsule Take 0.4 mg by mouth in the morning.    [provider]  Testosterone 20.25 MG/ACT (1.62%) GEL  Apply 2 Pump topically in the morning. 07/27/19   [provider]  vitamin C (ASCORBIC ACID) 500 MG tablet Take 500 mg by mouth in the morning.    [provider]    Family History Family History  Problem Relation Age of Onset   Heart attack Father 68   Diabetes Mother    Stroke Mother 46   Hypertension Mother    Leukemia Sister    Colon cancer Paternal Uncle    Aortic aneurysm Brother        abdominal   Heart disease Brother    Atrial fibrillation Brother  Cancer Maternal Uncle         X3:prostate , renal, bone    Parkinson's disease Sister    Stroke Sister    Stroke Sister    Stroke Sister    Atrial fibrillation Sister    Esophageal cancer Neg Hx    Rectal cancer Neg Hx    Stomach cancer Neg Hx     Social History Social History   Tobacco Use   Smoking status: Some Days    Types: Cigarettes    Last attempt to quit: 01/07/1998    Years since quitting: 24.1   Smokeless tobacco: Never   Tobacco comments:    Smokes "every once in a while"   07/25/21 ARJ   Vaping Use   Vaping Use: Never used  Substance Use Topics   Alcohol use: No   Drug use: No     Allergies   Latex, Bromocriptine, Zestril [lisinopril], Diovan [valsartan], Hydrochlorothiazide, Norvasc [amlodipine], and Zocor [simvastatin]   Review of Systems Review of Systems  Constitutional:  Negative for activity change, appetite change, chills, diaphoresis, fatigue and fever.  HENT:  Positive for postnasal drip and sore throat. Negative for congestion, ear discharge and ear pain.   Eyes:  Negative for discharge.  Respiratory:  Positive for choking and shortness of breath.   Cardiovascular:  Negative for chest pain.  Musculoskeletal:  Negative for myalgias.  Neurological:  Negative for headaches.     Physical Exam Triage Vital Signs ED Triage Vitals  Enc Vitals Group     BP 02/20/22 1117 (!) 153/73     Pulse Rate 02/20/22 1117 88     Resp 02/20/22 1117 20     Temp 02/20/22 1117 98  F (36.7 C)     Temp Source 02/20/22 1117 Oral     SpO2 02/20/22 1117 93 %     Weight --      Height --      Head Circumference --      Peak Flow --      Pain Score 02/20/22 1120 7     Pain Loc --      Pain Edu? --      Excl. in [place]? --    No data found.  Updated Vital Signs BP (!) 153/73 (BP Location: Right Arm)   Pulse 88   Temp 98 F (36.7 C) (Oral)   Resp 20   SpO2 93%   Visual Acuity Right Eye Distance:   Left Eye Distance:   Bilateral Distance:    Right Eye Near:   Left Eye Near:    Bilateral Near:     Physical Exam Vitals and nursing note reviewed.  Constitutional:      General: He is not in acute distress.    Appearance: He is normal weight. He is not toxic-appearing.  HENT:     Right Ear: Tympanic membrane, ear canal and external ear normal.     Left Ear: Tympanic membrane, ear canal and external ear normal.     Nose: Congestion and rhinorrhea present.     Mouth/Throat:     Mouth: Mucous membranes are moist. No oral lesions.     Pharynx: Uvula midline. Posterior oropharyngeal erythema present.     Tonsils: No tonsillar exudate or tonsillar abscesses. 1+ on the right. 1+ on the left.  Eyes:     General: No scleral icterus.    Conjunctiva/sclera: Conjunctivae normal.  Neck:     Comments: Upper chain cervical nodes are tender and  little enlarged Cardiovascular:     Rate and Rhythm: Normal rate and regular rhythm.  Pulmonary:     Effort: Pulmonary effort is normal.     Comments: Has mild expiratory wheezing on L lung Musculoskeletal:        General: Normal range of motion.     Cervical back: Neck supple.  Lymphadenopathy:     Cervical: Cervical adenopathy present.  Skin:    General: Skin is warm and dry.  Neurological:     Mental Status: He is alert.  Psychiatric:        Mood and Affect: Mood normal.        Behavior: Behavior normal.        Thought Content: Thought content normal.      UC Treatments / Results  Labs (all labs ordered are  listed, but only abnormal results are displayed) Labs Reviewed  CULTURE, GROUP A STREP [facility])  POCT RAPID STREP A (OFFICE)    EKG   Radiology DG Chest 2 View  Result Date: 02/20/2022 CLINICAL DATA:  Shortness of breath, hypoxia, history of lobectomy EXAM: CHEST - 2 VIEW COMPARISON:  12/04/2021 FINDINGS: Normal heart size and vascularity. Remote median sternotomy noted. Postop changes in the right hemithorax. Similar chronic pleuroparenchymal scarring in the right hemithorax with volume loss. Stable position of the right IJ power port catheter, tip mid SVC level. No new superimposed pneumonia, collapse or consolidation. Previous upper lobe lung nodules by PET-CT are not visualized by plain radiography. Degenerative changes throughout the spine. No acute osseous finding. Trachea midline. IMPRESSION: Stable chronic and postoperative findings as above. No interval change or superimposed acute process by plain radiography. Electronically Signed   By: Jerilynn Mages.  Shick M.D.   On: 02/20/2022 12:00    Procedures Procedures (including critical care time)  Medications Ordered in UC Medications - No data to display  Initial Impression / Assessment and Plan / UC Course  I have reviewed the triage vital signs and the nursing notes.  Pertinent labs & imaging results that were available during my care of the patient were reviewed by me and considered in my medical decision making (see chart for details).  Acute pharyngitis URI  We will inform him if the throat culture comes back.  IN the mean time since his throat is so injected, I started him on Amoxicillin as noted. See instructions .   Final Clinical Impressions(s) / UC Diagnoses   Final diagnoses:  Acute pharyngitis, unspecified etiology     Discharge Instructions      The rapid strep test is negative, but your throat looks pretty red and I ill start you on antibiotic until the throat culture comes back in case this one did not pick up the  strep.  You may use Tylenol for throat pain, gargle with warm salt water 2-3 times a day Your chest xray is stable and does not show signs of infection   If you develop runny nose, headache, body aches please come back since you may need Flu and Covid testing      ED Prescriptions     Medication Sig Dispense Auth. Provider   amoxicillin (AMOXIL) 500 MG capsule Take 1 capsule (500 mg total) by mouth 2 (two) times daily. 20 capsule Rodriguez-Southworth, Sunday Spillers, PA-C      PDMP not reviewed this encounter.   Shelby Mattocks, Hershal Coria 02/20/22 1339

## 2022-02-21 ENCOUNTER — Encounter (HOSPITAL_COMMUNITY): Payer: Medicare Other

## 2022-02-22 DIAGNOSIS — D7589 Other specified diseases of blood and blood-forming organs: Secondary | ICD-10-CM | POA: Diagnosis not present

## 2022-02-23 LAB — CULTURE, GROUP A STREP (THRC)

## 2022-02-25 ENCOUNTER — Telehealth: Payer: Self-pay

## 2022-02-25 NOTE — Telephone Encounter (Signed)
Patient called wanting to know if he should stop taking his antibiotic he was prescribed for possible strep throat. Results all came back negative, spoke with provider Maricela Bo. and said it was ok to stop antibiotic since no step throat at this time.

## 2022-02-26 ENCOUNTER — Encounter (HOSPITAL_COMMUNITY): Payer: Medicare Other

## 2022-02-26 ENCOUNTER — Ambulatory Visit: Payer: Medicare Other

## 2022-02-26 DIAGNOSIS — C771 Secondary and unspecified malignant neoplasm of intrathoracic lymph nodes: Secondary | ICD-10-CM | POA: Diagnosis not present

## 2022-02-26 DIAGNOSIS — C3490 Malignant neoplasm of unspecified part of unspecified bronchus or lung: Secondary | ICD-10-CM | POA: Diagnosis not present

## 2022-02-26 DIAGNOSIS — C3411 Malignant neoplasm of upper lobe, right bronchus or lung: Secondary | ICD-10-CM | POA: Diagnosis not present

## 2022-02-26 DIAGNOSIS — R590 Localized enlarged lymph nodes: Secondary | ICD-10-CM | POA: Diagnosis not present

## 2022-02-26 DIAGNOSIS — Z79899 Other long term (current) drug therapy: Secondary | ICD-10-CM | POA: Diagnosis not present

## 2022-02-26 DIAGNOSIS — R0989 Other specified symptoms and signs involving the circulatory and respiratory systems: Secondary | ICD-10-CM | POA: Diagnosis not present

## 2022-02-26 DIAGNOSIS — Z7901 Long term (current) use of anticoagulants: Secondary | ICD-10-CM | POA: Diagnosis not present

## 2022-02-26 DIAGNOSIS — Z87891 Personal history of nicotine dependence: Secondary | ICD-10-CM | POA: Diagnosis not present

## 2022-02-26 DIAGNOSIS — J449 Chronic obstructive pulmonary disease, unspecified: Secondary | ICD-10-CM | POA: Diagnosis not present

## 2022-02-26 DIAGNOSIS — Z9889 Other specified postprocedural states: Secondary | ICD-10-CM | POA: Diagnosis not present

## 2022-02-26 DIAGNOSIS — R846 Abnormal cytological findings in specimens from respiratory organs and thorax: Secondary | ICD-10-CM | POA: Diagnosis not present

## 2022-02-26 DIAGNOSIS — I2510 Atherosclerotic heart disease of native coronary artery without angina pectoris: Secondary | ICD-10-CM | POA: Diagnosis not present

## 2022-02-28 ENCOUNTER — Encounter (HOSPITAL_COMMUNITY)
Admission: RE | Admit: 2022-02-28 | Discharge: 2022-02-28 | Disposition: A | Discharge status: Home / Self Care | Payer: Medicare Other | Source: Ambulatory Visit | Attending: Thoracic Surgery (Cardiothoracic Vascular Surgery) | Admitting: Thoracic Surgery (Cardiothoracic Vascular Surgery)

## 2022-02-28 DIAGNOSIS — C3411 Malignant neoplasm of upper lobe, right bronchus or lung: Secondary | ICD-10-CM | POA: Diagnosis not present

## 2022-02-28 DIAGNOSIS — C3412 Malignant neoplasm of upper lobe, left bronchus or lung: Secondary | ICD-10-CM | POA: Diagnosis not present

## 2022-02-28 NOTE — Progress Notes (Signed)
Daily Session Note  Patient Details  Name: Patrick Harris MRN: [id number] Date of Birth: 09/03/1945 Referring Provider:   [place] from 12/27/2021 in [place]  Referring Provider Dr. Williemae Natter       Encounter Date: 02/28/2022  Check In:  Session Check In - 02/28/22 1330       Check-In   Supervising physician immediately available to respond to emergencies [facility] MD immediately available    Physician(s) Dr. Dellia Cloud    Location [facility]    Staff Present Leana Roe, BS, Exercise Physiologist;Dalton Sherrie George, MS, ACSM-CEP;[employer] BSN, RN;Debra Wynetta Emery, RN, BSN    Virtual Visit No    Medication changes reported     No    Fall or balance concerns reported    No    Tobacco Cessation No Change    Warm-up and Cool-down Performed as group-led Higher education careers adviser Performed Yes    VAD Patient? No    PAD/SET Patient? No      Pain Assessment   Currently in Pain? No/denies    Pain Score 0-No pain    Multiple Pain Sites No             Capillary Blood Glucose: No results found for this or any previous visit (from the past 24 hour(s)).    Social History   Tobacco Use  Smoking Status Some Days   Types: Cigarettes   Last attempt to quit: 01/07/1998   Years since quitting: 24.1  Smokeless Tobacco Never  Tobacco Comments   Smokes "every once in a while"   07/25/21 ARJ     Goals Met:  Proper associated with RPD/PD & O2 Sat Independence with exercise equipment Using PLB without cueing & demonstrates good technique Exercise tolerated well Queuing for purse lip breathing No report of concerns or symptoms today Strength training completed today  Goals Unmet:  Not Applicable  Comments: check out at 14:30   Dr. Kathie Dike is Medical Director for [facility].

## 2022-03-04 DIAGNOSIS — C3491 Malignant neoplasm of unspecified part of right bronchus or lung: Secondary | ICD-10-CM | POA: Diagnosis not present

## 2022-03-05 ENCOUNTER — Ambulatory Visit: Payer: Medicare Other | Admitting: Physician Assistant

## 2022-03-05 ENCOUNTER — Ambulatory Visit (HOSPITAL_COMMUNITY)
Admission: RE | Admit: 2022-03-05 | Discharge: 2022-03-05 | Disposition: A | Discharge status: Home / Self Care | Payer: Medicare Other | Source: Ambulatory Visit | Attending: Vascular Surgery | Admitting: Vascular Surgery

## 2022-03-05 ENCOUNTER — Encounter (HOSPITAL_COMMUNITY)
Admission: RE | Admit: 2022-03-05 | Discharge: 2022-03-05 | Disposition: A | Discharge status: Home / Self Care | Payer: Medicare Other | Source: Ambulatory Visit | Attending: Thoracic Surgery (Cardiothoracic Vascular Surgery) | Admitting: Thoracic Surgery (Cardiothoracic Vascular Surgery)

## 2022-03-05 VITALS — BP 148/73 | HR 74 | Temp 97.6°F | Ht 72.0 in | Wt 184.4 lb

## 2022-03-05 VITALS — Wt 182.8 lb

## 2022-03-05 DIAGNOSIS — I739 Peripheral vascular disease, unspecified: Secondary | ICD-10-CM | POA: Insufficient documentation

## 2022-03-05 DIAGNOSIS — C3411 Malignant neoplasm of upper lobe, right bronchus or lung: Secondary | ICD-10-CM

## 2022-03-05 DIAGNOSIS — C3412 Malignant neoplasm of upper lobe, left bronchus or lung: Secondary | ICD-10-CM | POA: Diagnosis not present

## 2022-03-05 LAB — VAS US ABI WITH/WO TBI
Left ABI: 0.92
Right ABI: 0.8

## 2022-03-05 NOTE — Progress Notes (Unsigned)
Office Note   History of Present Illness   Patrick Harris is a 77 y.o. (December 24, 1945) male who presents for surveillance of PAD.  He was first seen by Dr. Stanford Breed in 2022 for evaluation of PAD at that time he had intermittent paresthesias and discomfort in his thighs, not believed to be related to arterial disease.  He did not have any classic symptoms of claudication or rest pain.  He was to take daily aspirin and statin and follow up in 1 year for surveillance.  At follow up today he is doing okay. He has been diagnosed with lung cancer in the past year and has been undergoing chemo/radiation for treatment. 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 He denies any discomfort in his legs, claudication, rest pain, or ulcerations. He is trying to stay active and strong during his treatment. He has been going to pulmonary rehab.  Current Outpatient Medications  Medication Sig Dispense Refill   acetaminophen (TYLENOL) 500 MG tablet Take 500 mg by mouth every 6 (six) hours as needed (for pain.).     amoxicillin (AMOXIL) 500 MG capsule Take 1 capsule (500 mg total) by mouth 2 (two) times daily. 20 capsule 0   aspirin EC 81 MG tablet Take 1 tablet (81 mg total) by mouth at bedtime. Okay to restart this medication on 07/03/2021 30 tablet 11   Cholecalciferol (VITAMIN D3) 2000 UNITS TABS Take 2,000 Units by mouth in the morning and at bedtime.     Coenzyme Q10 (COQ-10) 100 MG CAPS Take 100 mg by mouth in the morning.     Cyanocobalamin (B-12 PO) Take 1 tablet by mouth daily.     hydrALAZINE (APRESOLINE) 25 MG tablet Take 1 tablet (25 mg total) by mouth 3 (three) times daily. 270 tablet 3   irbesartan (AVAPRO) 300 MG tablet Take 1 tablet (300 mg total) by mouth daily. 90 tablet 3   nitroGLYCERIN (NITROSTAT) 0.4 MG SL tablet Place 1 tablet (0.4 mg total) under the tongue every 5 (five) minutes x 3 doses as needed for chest pain (if no relief after 2nd dose,  proceed to ED or call 911). 25 tablet 3   Omega-3 Fatty Acids (OMEGA-3 FISH OIL PO) Take 1,000 mg by mouth in the morning.     omeprazole (PRILOSEC) 40 MG capsule Take 1 capsule (40 mg total) by mouth in the morning.     ondansetron (ZOFRAN) 8 MG tablet Take 8 mg by mouth every 8 (eight) hours as needed for vomiting or nausea.     Polyethyl Glycol-Propyl Glycol (LUBRICANT EYE DROPS) 0.4-0.3 % SOLN Place 1-2 drops into both eyes 3 (three) times daily as needed (dry/irritated eyes.).     rosuvastatin (CRESTOR) 20 MG tablet TAKE HALF A TABLET (10 MG) BY MOUTH ONCE DAILY. 45 tablet 3   tadalafil (CIALIS) 5 MG tablet Take 5 mg by mouth daily as needed for erectile dysfunction.     tamsulosin (FLOMAX) 0.4 MG CAPS capsule Take 0.4 mg by mouth in the morning.     Testosterone 20.25 MG/ACT (1.62%) GEL Apply 2 Pump topically in the morning.     vitamin C (ASCORBIC ACID) 500 MG tablet Take 500 mg by mouth in the morning.     No current facility-administered medications for this visit.    REVIEW OF SYSTEMS (negative unless checked):   Cardiac:  '[]'$  Chest pain or chest pressure? '[]'$  Shortness of breath upon activity? '[]'$  Shortness of  breath when lying flat? '[]'$  Irregular heart rhythm?  Vascular:  '[]'$  Pain in calf, thigh, or hip brought on by walking? '[]'$  Pain in feet at night that wakes you up from your sleep? '[]'$  Blood clot in your veins? '[]'$  Leg swelling?  Pulmonary:  '[]'$  Oxygen at home? '[]'$  Productive cough? '[]'$  Wheezing?  Neurologic:  '[]'$  Sudden weakness in arms or legs? '[]'$  Sudden numbness in arms or legs? '[]'$  Sudden onset of difficult speaking or slurred speech? '[]'$  Temporary loss of vision in one eye? '[]'$  Problems with dizziness?  Gastrointestinal:  '[]'$  Blood in stool? '[]'$  Vomited blood?  Genitourinary:  '[]'$  Burning when urinating? '[]'$  Blood in urine?  Psychiatric:  '[]'$  Major depression  Hematologic:  '[]'$  Bleeding problems? '[]'$  Problems with blood clotting?  Dermatologic:  '[]'$  Rashes or  ulcers?  Constitutional:  '[]'$  Fever or chills?  Ear/Nose/Throat:  '[]'$  Change in hearing? '[]'$  Nose bleeds? '[]'$  Sore throat?  Musculoskeletal:  '[]'$  Back pain? '[]'$  Joint pain? '[]'$  Muscle pain?   Physical Examination   Vitals:   03/05/22 0901  BP: (!) 148/73  Pulse: 74  Temp: 97.6 F (36.4 C)  SpO2: 96%  Weight: 184 lb 6.4 oz (83.6 kg)  Height: 6' (1.829 m)   Body mass index is 25.01 kg/m.  General:  WDWN in NAD; vital signs documented above Gait: Not observed HENT: WNL, normocephalic Pulmonary: normal non-labored breathing  Cardiac: RRR Abdomen: soft, NT, no masses Skin: without rashes Vascular Exam/Pulses: 1+ DP pulses bilaterally Extremities: without ischemic changes, without gangrene , without cellulitis; without open wounds;  Musculoskeletal: no muscle wasting or atrophy  Neurologic: A&O X 3;  No focal weakness or paresthesias are detected Psychiatric:  The pt has Normal affect.  Non-Invasive Vascular imaging   ABI (03/05/2022) R:  ABI: 0.8 (0.68),  PT: bi DP: mono TBI:  0.65 L:  ABI: 0.92 (0.82),  PT: bi DP: bi TBI: 0.66   Medical Decision Making   Patrick Harris is a 77 y.o. male who presents for surveillance of PAD  Based on the patient's vascular studies, his ABIs have increased since 2022. His R ABI has increased from 0.68 to 0.8. His L ABI has increased from 0.82 to 0.92 He has 1+ DP pulses bilaterally. He does not have any lower extremity wounds He denies any claudication, rest pain, or ulcerations He will continue his ASA and statin daily and follow up with our office in 1 yr with repeat ABIs   Vicente Serene PA-C [facility]: [facility] MD: Roxanne Mins

## 2022-03-05 NOTE — Progress Notes (Signed)
Daily Session Note  Patient Details  Name: RYELAN LEVENGOOD MRN: [id number] Date of Birth: 08-13-1945 Referring Provider:   [place] from 12/27/2021 in [place]  Referring Provider Dr. Williemae Natter       Encounter Date: 03/05/2022  Check In:  Session Check In - 03/05/22 1327       Check-In   Supervising physician immediately available to respond to emergencies [facility] MD immediately available    Physician(s) Dr Harl Bowie    Location [facility]    Staff Present Leana Roe, BS, Exercise Physiologist;Phyllis Billingsley, RN;Limuel Nieblas Hassell Done, RN, BSN    Virtual Visit No    Medication changes reported     No    Fall or balance concerns reported    No    Tobacco Cessation No Change    Warm-up and Cool-down Performed as group-led instruction    Resistance Training Performed Yes    VAD Patient? No    PAD/SET Patient? No      Pain Assessment   Currently in Pain? No/denies    Pain Score 0-No pain    Multiple Pain Sites No             Capillary Blood Glucose: No results found for this or any previous visit (from the past 24 hour(s)).    Social History   Tobacco Use  Smoking Status Former   Packs/day: 0.25   Types: Cigarettes   Quit date: 05/2021   Years since quitting: 0.8  Smokeless Tobacco Never  Tobacco Comments   Smokes "every once in a while"   07/25/21 ARJ     Goals Met:  Proper associated with RPD/PD & O2 Sat Independence with exercise equipment Using PLB without cueing & demonstrates good technique Exercise tolerated well Queuing for purse lip breathing No report of concerns or symptoms today Strength training completed today  Goals Unmet:  Not Applicable  Comments: Checkout at 1430.   Dr. Kathie Dike is Medical Director for [facility] Pul[facility] Re[facility].

## 2022-03-06 ENCOUNTER — Telehealth: Payer: Self-pay | Admitting: Internal Medicine

## 2022-03-06 ENCOUNTER — Other Ambulatory Visit: Payer: Self-pay | Admitting: Radiation Oncology

## 2022-03-06 ENCOUNTER — Other Ambulatory Visit: Payer: Self-pay

## 2022-03-06 ENCOUNTER — Ambulatory Visit
Admission: RE | Admit: 2022-03-06 | Discharge: 2022-03-06 | Disposition: A | Discharge status: Home / Self Care | Payer: Self-pay | Source: Ambulatory Visit | Attending: Radiation Oncology | Admitting: Radiation Oncology

## 2022-03-06 DIAGNOSIS — R918 Other nonspecific abnormal finding of lung field: Secondary | ICD-10-CM

## 2022-03-06 DIAGNOSIS — C3490 Malignant neoplasm of unspecified part of unspecified bronchus or lung: Secondary | ICD-10-CM

## 2022-03-06 NOTE — Telephone Encounter (Signed)
Scheduled appt per 2/28 referral. Pt is aware of appt date and time. Pt is aware to arrive 15 mins prior to appt time and to bring and updated insurance card. Pt is aware of appt location.   °

## 2022-03-06 NOTE — Progress Notes (Signed)
Pulmonary Individual Treatment Plan  Patient Details  Name: Patrick Harris MRN: [id number] Date of Birth: Apr 23, 1945 Referring Provider:   [facility] from 12/27/2021 in [place]  Referring Provider Dr. Williemae Natter       Initial Encounter Date:  Flowsheet Row PULMONARY REHAB OTHER RESP ORIENTATION from 12/27/2021 in [place]  Date 12/27/21       Visit Diagnosis: Malignant neoplasm of right upper lobe of lung ([place])  Malignant neoplasm of upper lobe of left lung ([place])  Patient's Home Medications on Admission:   Current Outpatient Medications:    acetaminophen (TYLENOL) 500 MG tablet, Take 500 mg by mouth every 6 (six) hours as needed (for pain.)., Disp: , Rfl:    amoxicillin (AMOXIL) 500 MG capsule, Take 1 capsule (500 mg total) by mouth 2 (two) times daily., Disp: 20 capsule, Rfl: 0   aspirin EC 81 MG tablet, Take 1 tablet (81 mg total) by mouth at bedtime. Okay to restart this medication on 07/03/2021, Disp: 30 tablet, Rfl: 11   Cholecalciferol (VITAMIN D3) 2000 UNITS TABS, Take 2,000 Units by mouth in the morning and at bedtime., Disp: , Rfl:    Coenzyme Q10 (COQ-10) 100 MG CAPS, Take 100 mg by mouth in the morning., Disp: , Rfl:    Cyanocobalamin (B-12 PO), Take 1 tablet by mouth daily., Disp: , Rfl:    hydrALAZINE (APRESOLINE) 25 MG tablet, Take 1 tablet (25 mg total) by mouth 3 (three) times daily., Disp: 270 tablet, Rfl: 3   irbesartan (AVAPRO) 300 MG tablet, Take 1 tablet (300 mg total) by mouth daily., Disp: 90 tablet, Rfl: 3   nitroGLYCERIN (NITROSTAT) 0.4 MG SL tablet, Place 1 tablet (0.4 mg total) under the tongue every 5 (five) minutes x 3 doses as needed for chest pain (if no relief after 2nd dose, proceed to ED or call 911)., Disp: 25 tablet, Rfl: 3   Omega-3 Fatty Acids (OMEGA-3 FISH OIL PO), Take 1,000 mg by mouth in the morning., Disp: , Rfl:    omeprazole (PRILOSEC) 40 MG capsule,  Take 1 capsule (40 mg total) by mouth in the morning., Disp: , Rfl:    ondansetron (ZOFRAN) 8 MG tablet, Take 8 mg by mouth every 8 (eight) hours as needed for vomiting or nausea., Disp: , Rfl:    Polyethyl Glycol-Propyl Glycol (LUBRICANT EYE DROPS) 0.4-0.3 % SOLN, Place 1-2 drops into both eyes 3 (three) times daily as needed (dry/irritated eyes.)., Disp: , Rfl:    rosuvastatin (CRESTOR) 20 MG tablet, TAKE HALF A TABLET (10 MG) BY MOUTH ONCE DAILY., Disp: 45 tablet, Rfl: 3   tadalafil (CIALIS) 5 MG tablet, Take 5 mg by mouth daily as needed for erectile dysfunction., Disp: , Rfl:    tamsulosin (FLOMAX) 0.4 MG CAPS capsule, Take 0.4 mg by mouth in the morning., Disp: , Rfl:    Testosterone 20.25 MG/ACT (1.62%) GEL, Apply 2 Pump topically in the morning., Disp: , Rfl:    vitamin C (ASCORBIC ACID) 500 MG tablet, Take 500 mg by mouth in the morning., Disp: , Rfl:   Past Medical History: Past Medical History:  Diagnosis Date   Arthritis    CAD (coronary artery disease)    CABG with LIMA to LAD and RIMA to RCA August 2000 - Dr. Roxan Hockey   Cancer [facility])    basal cell   CHF (congestive heart failure) ([place])    COPD (chronic obstructive pulmonary disease) ([place])  Elevated glycated hemoglobin    Essential hypertension    GERD (gastroesophageal reflux disease)    Heart murmur    Hyperlipidemia    Hypopituitarism (HCC)    Lung cancer ([place])    Osteopenia    Dr Wilson Singer   Pancreatitis    Peyronie disease    Testosterone deficiency    Dr Wilson Singer    Tobacco Use: Social History   Tobacco Use  Smoking Status Former   Packs/day: 0.25   Types: Cigarettes   Quit date: 05/2021   Years since quitting: 0.8  Smokeless Tobacco Never  Tobacco Comments   Smokes "every once in a while"   07/25/21 [facility]     Labs: Review Flowsheet  More data exists      Latest Ref Rng & Units 08/22/2017 12/23/2017 08/24/2018 08/25/2019 08/29/2020  Labs for ITP Cardiac and Pulmonary Rehab  Cholestrol 0 - 200 mg/dL 128   137  146  122  118   LDL (calc) 0 - 99 mg/dL 71  81  86  71  65   HDL-C >39.00 mg/dL 29.10  36  34.20  33  33.60   Trlycerides 0.0 - 149.0 mg/dL 140.0  101  130.0  98  96.0   Hemoglobin A1c 4.6 - 6.5 % 6.2  5.9  6.1  5.5  6.0     Capillary Blood Glucose: Lab Results  Component Value Date   GLUCAP 117 (H) 07/23/2021     Pulmonary Assessment Scores:  Pulmonary Assessment Scores     Row Name 12/27/21 1312         ADL UCSD   ADL Phase Entry     SOB Score total 69     Rest 2     Walk 3     Stairs 4     Bath 3     Dress 3     Shop 3       CAT Score   CAT Score 23       mMRC Score   mMRC Score 3             UCSD: Self-administered rating of dyspnea associated with activities of daily living (ADLs) 6-point scale (0 = "not at all" to 5 = "maximal or unable to do because of breathlessness")  Scoring Scores range from 0 to 120.  Minimally important difference is 5 units  CAT: CAT can identify the health impairment of COPD patients and is better correlated with disease progression.  CAT has a scoring range of zero to 40. The CAT score is classified into four groups of low (less than 10), medium (10 - 20), high (21-30) and very high (31-40) based on the impact level of disease on health status. A CAT score over 10 suggests significant symptoms.  A worsening CAT score could be explained by an exacerbation, poor medication adherence, poor inhaler technique, or progression of COPD or comorbid conditions.  CAT MCID is 2 points  mMRC: mMRC (Modified Medical Research Council) Dyspnea Scale is used to assess the degree of baseline functional disability in patients of respiratory disease due to dyspnea. No minimal important difference is established. A decrease in score of 1 point or greater is considered a positive change.   Pulmonary Function Assessment:   Exercise Target Goals: Exercise Program Goal: Individual exercise prescription set using results from initial 6 min walk  test and THRR while considering  patient's activity barriers and safety.   Exercise Prescription Goal: Initial exercise prescription builds  to 30-45 minutes a day of aerobic activity, 2-3 days per week.  Home exercise guidelines will be given to patient during program as part of exercise prescription that the participant will acknowledge.  Activity Barriers & Risk Stratification:  Activity Barriers & Cardiac Risk Stratification - 12/27/21 1250       Activity Barriers & Cardiac Risk Stratification   Activity Barriers Shortness of Breath;Deconditioning;Neck/Spine Problems    Cardiac Risk Stratification High             6 Minute Walk:  6 Minute Walk     Row Name 12/27/21 1331         6 Minute Walk   Phase Initial     Distance 1200 feet     Walk Time 6 minutes     # of Rest Breaks 0     MPH 2.27     METS 2.88     RPE 12     Perceived Dyspnea  12     VO2 Peak 10.09     Symptoms No     Resting HR 80 bpm     Resting BP 110/60     Resting Oxygen Saturation  96 %     Exercise Oxygen Saturation  during 6 min walk 93 %     Max Ex. HR 121 bpm     Max Ex. BP 130/60     2 Minute Post BP 120/60       Interval HR   1 Minute HR 114     2 Minute HR 105     3 Minute HR 110     4 Minute HR 105     5 Minute HR 104     6 Minute HR 121     2 Minute Post HR 80     Interval Heart Rate? Yes       Interval Oxygen   Interval Oxygen? Yes     Baseline Oxygen Saturation % 96 %     1 Minute Oxygen Saturation % 96 %     1 Minute Liters of Oxygen 0 L     2 Minute Oxygen Saturation % 94 %     2 Minute Liters of Oxygen 0 L     3 Minute Oxygen Saturation % 95 %     3 Minute Liters of Oxygen 0 L     4 Minute Oxygen Saturation % 93 %     4 Minute Liters of Oxygen 0 L     5 Minute Oxygen Saturation % 95 %     5 Minute Liters of Oxygen 0 L     6 Minute Oxygen Saturation % 94 %     6 Minute Liters of Oxygen 0 L     2 Minute Post Oxygen Saturation % 96 %     2 Minute Post Liters of Oxygen  0 L              Oxygen Initial Assessment:  Oxygen Initial Assessment - 12/27/21 1311       Home Oxygen   Home Oxygen Device None    Sleep Oxygen Prescription None    Home Exercise Oxygen Prescription None    Home Resting Oxygen Prescription None      Initial 6 min Walk   Oxygen Used None      Program Oxygen Prescription   Program Oxygen Prescription None      Intervention   Short Term Goals To learn and understand  importance of monitoring SPO2 with pulse oximeter and demonstrate accurate use of the pulse oximeter.;To learn and understand importance of maintaining oxygen saturations>88%;To learn and demonstrate proper pursed lip breathing techniques or other breathing techniques.     Long  Term Goals Verbalizes importance of monitoring SPO2 with pulse oximeter and return demonstration;Maintenance of O2 saturations>88%;Exhibits proper breathing techniques, such as pursed lip breathing or other method taught during program session             Oxygen Re-Evaluation:  Oxygen Re-Evaluation     Row Name 09/18/21 1428 10/16/21 1359 01/08/22 1604 02/05/22 1526 03/05/22 1527     Program Oxygen Prescription   Program Oxygen Prescription None None None None None     Home Oxygen   Home Oxygen Device None None None None None   Sleep Oxygen Prescription None None None None None   Home Exercise Oxygen Prescription None None None None None   Home Resting Oxygen Prescription None None None None None   Compliance with Home Oxygen Use Yes Yes Yes Yes Yes     Goals/Expected Outcomes   Short Term Goals To learn and understand importance of maintaining oxygen saturations>88%;To learn and demonstrate proper use of respiratory medications;To learn and understand importance of monitoring SPO2 with pulse oximeter and demonstrate accurate use of the pulse oximeter. To learn and understand importance of maintaining oxygen saturations>88%;To learn and demonstrate proper use of respiratory  medications;To learn and understand importance of monitoring SPO2 with pulse oximeter and demonstrate accurate use of the pulse oximeter. To learn and understand importance of monitoring SPO2 with pulse oximeter and demonstrate accurate use of the pulse oximeter.;To learn and understand importance of maintaining oxygen saturations>88%;To learn and demonstrate proper pursed lip breathing techniques or other breathing techniques.  To learn and understand importance of monitoring SPO2 with pulse oximeter and demonstrate accurate use of the pulse oximeter.;To learn and understand importance of maintaining oxygen saturations>88%;To learn and demonstrate proper pursed lip breathing techniques or other breathing techniques.  To learn and understand importance of monitoring SPO2 with pulse oximeter and demonstrate accurate use of the pulse oximeter.;To learn and understand importance of maintaining oxygen saturations>88%;To learn and demonstrate proper pursed lip breathing techniques or other breathing techniques.    Long  Term Goals -- Verbalizes importance of monitoring SPO2 with pulse oximeter and return demonstration;Maintenance of O2 saturations>88%;Exhibits proper breathing techniques, such as pursed lip breathing or other method taught during program session;Compliance with respiratory medication Verbalizes importance of monitoring SPO2 with pulse oximeter and return demonstration;Maintenance of O2 saturations>88%;Exhibits proper breathing techniques, such as pursed lip breathing or other method taught during program session Verbalizes importance of monitoring SPO2 with pulse oximeter and return demonstration;Maintenance of O2 saturations>88%;Exhibits proper breathing techniques, such as pursed lip breathing or other method taught during program session Verbalizes importance of monitoring SPO2 with pulse oximeter and return demonstration;Maintenance of O2 saturations>88%;Exhibits proper breathing techniques, such as  pursed lip breathing or other method taught during program session   Goals/Expected Outcomes compliance compliance compliance compliance compliance            Oxygen Discharge (Final Oxygen Re-Evaluation):  Oxygen Re-Evaluation - 03/05/22 1527       Program Oxygen Prescription   Program Oxygen Prescription None      Home Oxygen   Home Oxygen Device None    Sleep Oxygen Prescription None    Home Exercise Oxygen Prescription None    Home Resting Oxygen Prescription None    Compliance with Home Oxygen  Use Yes      Goals/Expected Outcomes   Short Term Goals To learn and understand importance of monitoring SPO2 with pulse oximeter and demonstrate accurate use of the pulse oximeter.;To learn and understand importance of maintaining oxygen saturations>88%;To learn and demonstrate proper pursed lip breathing techniques or other breathing techniques.     Long  Term Goals Verbalizes importance of monitoring SPO2 with pulse oximeter and return demonstration;Maintenance of O2 saturations>88%;Exhibits proper breathing techniques, such as pursed lip breathing or other method taught during program session    Goals/Expected Outcomes compliance             Initial Exercise Prescription:  Initial Exercise Prescription - 12/27/21 1300       Date of Initial Exercise RX and Referring Provider   Date 12/27/21    Referring Provider Dr. Williemae Natter    Expected Discharge Date 04/30/22      Treadmill   MPH 1.5    Grade 0    Minutes 17      Recumbant Elliptical   Level 1    RPM 45    Minutes 22      Prescription Details   Frequency (times per week) 2    Duration Progress to 30 minutes of continuous aerobic without signs/symptoms of physical distress      Intensity   THRR 40-80% of Max Heartrate 58-115    Ratings of Perceived Exertion 11-13    Perceived Dyspnea 0-4      Resistance Training   Training Prescription Yes    Weight 4    Reps 10-15             Perform Capillary Blood  Glucose checks as needed.  Exercise Prescription Changes:   Exercise Prescription Changes     Row Name 09/18/21 1400 10/04/21 1400 10/11/21 1400 10/30/21 1400 01/08/22 1500     Response to Exercise   Blood Pressure (Admit) 140/64 128/60 120/60 114/60 112/62   Blood Pressure (Exercise) 128/62 160/72 150/60 124/60 130/62   Blood Pressure (Exit) 120/62 130/60 120/60 122/64 122/58   Heart Rate (Admit) 68 bpm 82 bpm 78 bpm 82 bpm 77 bpm   Heart Rate (Exercise) 92 bpm 90 bpm 85 bpm 90 bpm 92 bpm   Heart Rate (Exit) 78 bpm 79 bpm 86 bpm 90 bpm 80 bpm   Oxygen Saturation (Admit) 93 % 94 % 96 % 96 % 95 %   Oxygen Saturation (Exercise) 93 % 93 % 94 % 95 % 93 %   Oxygen Saturation (Exit) 93 % 95 % 94 % 94 % 95 %   Rating of Perceived Exertion (Exercise) '11 12 12 12 12   '$ Perceived Dyspnea (Exercise) '11 12 12 12 12   '$ Duration Continue with 30 min of aerobic exercise without signs/symptoms of physical distress. Continue with 30 min of aerobic exercise without signs/symptoms of physical distress. Continue with 30 min of aerobic exercise without signs/symptoms of physical distress. Continue with 30 min of aerobic exercise without signs/symptoms of physical distress. Continue with 30 min of aerobic exercise without signs/symptoms of physical distress.   Intensity THRR unchanged THRR unchanged THRR unchanged THRR unchanged THRR unchanged     Progression   Progression Continue to progress workloads to maintain intensity without signs/symptoms of physical distress. Continue to progress workloads to maintain intensity without signs/symptoms of physical distress. Continue to progress workloads to maintain intensity without signs/symptoms of physical distress. Continue to progress workloads to maintain intensity without signs/symptoms of physical distress. Continue to progress  workloads to maintain intensity without signs/symptoms of physical distress.     Resistance Training   Training Prescription Yes Yes Yes  Yes Yes   Weight '4 4 4 4 4   '$ Reps 10-15 10-15 10-15 10-15 10-15   Time 10 Minutes 10 Minutes 10 Minutes 10 Minutes 10 Minutes     Treadmill   MPH 1.8 2.5 2.5 2.4 1.8   Grade 0 0 0 0 0   Minutes '17 17 17 17 17   '$ METs 2.38 2.91 2.91 2.87 2.38     Recumbant Elliptical   Level '1 2 1 1 1   '$ RPM 53 57 54 44 34   Minutes '22 22 22 22 22   '$ METs 3.3 3.8 3 2.3 2.1    Row Name 01/22/22 1400 02/05/22 1500 02/19/22 1400 03/05/22 1500       Response to Exercise   Blood Pressure (Admit) 128/64 106/64 138/72 114/70    Blood Pressure (Exercise) 132/60 124/68 134/66 132/62    Blood Pressure (Exit) 108/60 126/64 114/62 122/64    Heart Rate (Admit) 80 bpm 60 bpm 78 bpm 81 bpm    Heart Rate (Exercise) 90 bpm 98 bpm 93 bpm 93 bpm    Heart Rate (Exit) 85 bpm 62 bpm 83 bpm 82 bpm    Oxygen Saturation (Admit) 94 % 92 % 95 % 94 %    Oxygen Saturation (Exercise) 94 % 94 % 94 % 93 %    Oxygen Saturation (Exit) 94 % 92 % 95 % 96 %    Rating of Perceived Exertion (Exercise) '12 12 12 12    '$ Perceived Dyspnea (Exercise) '12 12 12 12    '$ Duration Continue with 30 min of aerobic exercise without signs/symptoms of physical distress. Continue with 30 min of aerobic exercise without signs/symptoms of physical distress. Continue with 30 min of aerobic exercise without signs/symptoms of physical distress. Continue with 30 min of aerobic exercise without signs/symptoms of physical distress.    Intensity THRR unchanged THRR unchanged THRR unchanged THRR unchanged      Progression   Progression Continue to progress workloads to maintain intensity without signs/symptoms of physical distress. Continue to progress workloads to maintain intensity without signs/symptoms of physical distress. Continue to progress workloads to maintain intensity without signs/symptoms of physical distress. Continue to progress workloads to maintain intensity without signs/symptoms of physical distress.      Resistance Training   Training  Prescription Yes Yes Yes Yes    Weight '4 4 5 5    '$ Reps 10-15 10-15 10-15 10-15    Time 10 Minutes 10 Minutes 10 Minutes 10 Minutes      Treadmill   MPH 2.2 2.2 2.2 2    Grade 0 0 0 0    Minutes '17 17 17 17    '$ METs 2.53 2.53 2.53 2.53      Recumbant Elliptical   Level '2 2 3 4    '$ RPM 36 35 34 34    Minutes '22 22 22 22    '$ METs 1.'6 2 2 '$ 2.1             Exercise Comments:   Exercise Goals and Review:   Exercise Goals     Row Name 09/18/21 1425 10/16/21 1355 12/27/21 1334 01/08/22 1601 02/05/22 1527     Exercise Goals   Increase Physical Activity Yes Yes Yes Yes Yes   Intervention Provide advice, education, support and counseling about physical activity/exercise needs.;Develop an individualized exercise prescription for  aerobic and resistive training based on initial evaluation findings, risk stratification, comorbidities and participant's personal goals. Provide advice, education, support and counseling about physical activity/exercise needs.;Develop an individualized exercise prescription for aerobic and resistive training based on initial evaluation findings, risk stratification, comorbidities and participant's personal goals. Provide advice, education, support and counseling about physical activity/exercise needs.;Develop an individualized exercise prescription for aerobic and resistive training based on initial evaluation findings, risk stratification, comorbidities and participant's personal goals. Provide advice, education, support and counseling about physical activity/exercise needs.;Develop an individualized exercise prescription for aerobic and resistive training based on initial evaluation findings, risk stratification, comorbidities and participant's personal goals. Provide advice, education, support and counseling about physical activity/exercise needs.;Develop an individualized exercise prescription for aerobic and resistive training based on initial evaluation findings,  risk stratification, comorbidities and participant's personal goals.   Expected Outcomes Short Term: Attend rehab on a regular basis to increase amount of physical activity.;Long Term: Add in home exercise to make exercise part of routine and to increase amount of physical activity.;Long Term: Exercising regularly at least 3-5 days a week. Short Term: Attend rehab on a regular basis to increase amount of physical activity.;Long Term: Add in home exercise to make exercise part of routine and to increase amount of physical activity.;Long Term: Exercising regularly at least 3-5 days a week. Short Term: Attend rehab on a regular basis to increase amount of physical activity.;Long Term: Add in home exercise to make exercise part of routine and to increase amount of physical activity.;Long Term: Exercising regularly at least 3-5 days a week. Short Term: Attend rehab on a regular basis to increase amount of physical activity.;Long Term: Add in home exercise to make exercise part of routine and to increase amount of physical activity.;Long Term: Exercising regularly at least 3-5 days a week. Short Term: Attend rehab on a regular basis to increase amount of physical activity.;Long Term: Add in home exercise to make exercise part of routine and to increase amount of physical activity.;Long Term: Exercising regularly at least 3-5 days a week.   Increase Strength and Stamina Yes Yes Yes Yes Yes   Intervention Provide advice, education, support and counseling about physical activity/exercise needs.;Develop an individualized exercise prescription for aerobic and resistive training based on initial evaluation findings, risk stratification, comorbidities and participant's personal goals. Provide advice, education, support and counseling about physical activity/exercise needs.;Develop an individualized exercise prescription for aerobic and resistive training based on initial evaluation findings, risk stratification, comorbidities  and participant's personal goals. Provide advice, education, support and counseling about physical activity/exercise needs.;Develop an individualized exercise prescription for aerobic and resistive training based on initial evaluation findings, risk stratification, comorbidities and participant's personal goals. Provide advice, education, support and counseling about physical activity/exercise needs.;Develop an individualized exercise prescription for aerobic and resistive training based on initial evaluation findings, risk stratification, comorbidities and participant's personal goals. Provide advice, education, support and counseling about physical activity/exercise needs.;Develop an individualized exercise prescription for aerobic and resistive training based on initial evaluation findings, risk stratification, comorbidities and participant's personal goals.   Expected Outcomes Short Term: Increase workloads from initial exercise prescription for resistance, speed, and METs.;Short Term: Perform resistance training exercises routinely during rehab and add in resistance training at home;Long Term: Improve cardiorespiratory fitness, muscular endurance and strength as measured by increased METs and functional capacity (6MWT) Short Term: Increase workloads from initial exercise prescription for resistance, speed, and METs.;Short Term: Perform resistance training exercises routinely during rehab and add in resistance training at home;Long Term: Improve cardiorespiratory  fitness, muscular endurance and strength as measured by increased METs and functional capacity (6MWT) Short Term: Increase workloads from initial exercise prescription for resistance, speed, and METs.;Short Term: Perform resistance training exercises routinely during rehab and add in resistance training at home;Long Term: Improve cardiorespiratory fitness, muscular endurance and strength as measured by increased METs and functional capacity (6MWT)  Short Term: Increase workloads from initial exercise prescription for resistance, speed, and METs.;Short Term: Perform resistance training exercises routinely during rehab and add in resistance training at home;Long Term: Improve cardiorespiratory fitness, muscular endurance and strength as measured by increased METs and functional capacity (6MWT) Short Term: Increase workloads from initial exercise prescription for resistance, speed, and METs.;Short Term: Perform resistance training exercises routinely during rehab and add in resistance training at home;Long Term: Improve cardiorespiratory fitness, muscular endurance and strength as measured by increased METs and functional capacity (6MWT)   Able to understand and use rate of perceived exertion (RPE) scale Yes Yes Yes Yes Yes   Intervention Provide education and explanation on how to use RPE scale Provide education and explanation on how to use RPE scale Provide education and explanation on how to use RPE scale Provide education and explanation on how to use RPE scale Provide education and explanation on how to use RPE scale   Expected Outcomes Short Term: Able to use RPE daily in rehab to express subjective intensity level;Long Term:  Able to use RPE to guide intensity level when exercising independently -- Short Term: Able to use RPE daily in rehab to express subjective intensity level;Long Term:  Able to use RPE to guide intensity level when exercising independently Short Term: Able to use RPE daily in rehab to express subjective intensity level;Long Term:  Able to use RPE to guide intensity level when exercising independently Short Term: Able to use RPE daily in rehab to express subjective intensity level;Long Term:  Able to use RPE to guide intensity level when exercising independently   Able to understand and use Dyspnea scale Yes Yes Yes Yes Yes   Intervention Provide education and explanation on how to use Dyspnea scale Provide education and  explanation on how to use Dyspnea scale Provide education and explanation on how to use Dyspnea scale Provide education and explanation on how to use Dyspnea scale Provide education and explanation on how to use Dyspnea scale   Expected Outcomes Short Term: Able to use Dyspnea scale daily in rehab to express subjective sense of shortness of breath during exertion;Long Term: Able to use Dyspnea scale to guide intensity level when exercising independently Short Term: Able to use Dyspnea scale daily in rehab to express subjective sense of shortness of breath during exertion;Long Term: Able to use Dyspnea scale to guide intensity level when exercising independently Short Term: Able to use Dyspnea scale daily in rehab to express subjective sense of shortness of breath during exertion;Long Term: Able to use Dyspnea scale to guide intensity level when exercising independently Short Term: Able to use Dyspnea scale daily in rehab to express subjective sense of shortness of breath during exertion;Long Term: Able to use Dyspnea scale to guide intensity level when exercising independently Short Term: Able to use Dyspnea scale daily in rehab to express subjective sense of shortness of breath during exertion;Long Term: Able to use Dyspnea scale to guide intensity level when exercising independently   Knowledge and understanding of Target Heart Rate Range (THRR) Yes Yes Yes Yes Yes   Intervention Provide education and explanation of THRR including how the numbers  were predicted and where they are located for reference Provide education and explanation of THRR including how the numbers were predicted and where they are located for reference Provide education and explanation of THRR including how the numbers were predicted and where they are located for reference Provide education and explanation of THRR including how the numbers were predicted and where they are located for reference Provide education and explanation of THRR  including how the numbers were predicted and where they are located for reference   Expected Outcomes Short Term: Able to state/look up THRR;Short Term: Able to use daily as guideline for intensity in rehab;Long Term: Able to use THRR to govern intensity when exercising independently Short Term: Able to state/look up THRR;Short Term: Able to use daily as guideline for intensity in rehab;Long Term: Able to use THRR to govern intensity when exercising independently Short Term: Able to state/look up THRR;Short Term: Able to use daily as guideline for intensity in rehab;Long Term: Able to use THRR to govern intensity when exercising independently Short Term: Able to state/look up THRR;Short Term: Able to use daily as guideline for intensity in rehab;Long Term: Able to use THRR to govern intensity when exercising independently Short Term: Able to state/look up THRR;Short Term: Able to use daily as guideline for intensity in rehab;Long Term: Able to use THRR to govern intensity when exercising independently   Understanding of Exercise Prescription Yes Yes Yes Yes Yes   Intervention Provide education, explanation, and written materials on patient's individual exercise prescription Provide education, explanation, and written materials on patient's individual exercise prescription Provide education, explanation, and written materials on patient's individual exercise prescription Provide education, explanation, and written materials on patient's individual exercise prescription Provide education, explanation, and written materials on patient's individual exercise prescription   Expected Outcomes Short Term: Able to explain program exercise prescription;Long Term: Able to explain home exercise prescription to exercise independently Short Term: Able to explain program exercise prescription;Long Term: Able to explain home exercise prescription to exercise independently Short Term: Able to explain program exercise  prescription;Long Term: Able to explain home exercise prescription to exercise independently Short Term: Able to explain program exercise prescription;Long Term: Able to explain home exercise prescription to exercise independently Short Term: Able to explain program exercise prescription;Long Term: Able to explain home exercise prescription to exercise independently    [place] Name 03/05/22 1522             Exercise Goals   Increase Physical Activity Yes       Intervention Provide advice, education, support and counseling about physical activity/exercise needs.;Develop an individualized exercise prescription for aerobic and resistive training based on initial evaluation findings, risk stratification, comorbidities and participant's personal goals.       Expected Outcomes Short Term: Attend rehab on a regular basis to increase amount of physical activity.;Long Term: Add in home exercise to make exercise part of routine and to increase amount of physical activity.;Long Term: Exercising regularly at least 3-5 days a week.       Increase Strength and Stamina Yes       Intervention Provide advice, education, support and counseling about physical activity/exercise needs.;Develop an individualized exercise prescription for aerobic and resistive training based on initial evaluation findings, risk stratification, comorbidities and participant's personal goals.       Expected Outcomes Short Term: Increase workloads from initial exercise prescription for resistance, speed, and METs.;Short Term: Perform resistance training exercises routinely during rehab and add in resistance training at home;Long Term: Improve  cardiorespiratory fitness, muscular endurance and strength as measured by increased METs and functional capacity (6MWT)       Able to understand and use rate of perceived exertion (RPE) scale Yes       Intervention Provide education and explanation on how to use RPE scale       Expected Outcomes Short Term:  Able to use RPE daily in rehab to express subjective intensity level;Long Term:  Able to use RPE to guide intensity level when exercising independently       Able to understand and use Dyspnea scale Yes       Intervention Provide education and explanation on how to use Dyspnea scale       Expected Outcomes Short Term: Able to use Dyspnea scale daily in rehab to express subjective sense of shortness of breath during exertion;Long Term: Able to use Dyspnea scale to guide intensity level when exercising independently       Knowledge and understanding of Target Heart Rate Range (THRR) Yes       Intervention Provide education and explanation of THRR including how the numbers were predicted and where they are located for reference       Expected Outcomes Short Term: Able to state/look up THRR;Short Term: Able to use daily as guideline for intensity in rehab;Long Term: Able to use THRR to govern intensity when exercising independently       Understanding of Exercise Prescription Yes       Intervention Provide education, explanation, and written materials on patient's individual exercise prescription       Expected Outcomes Short Term: Able to explain program exercise prescription;Long Term: Able to explain home exercise prescription to exercise independently                Exercise Goals Re-Evaluation :  Exercise Goals Re-Evaluation     [place] Name 09/18/21 1425 10/16/21 1355 01/08/22 1601 02/05/22 1528 03/05/22 1524     Exercise Goal Re-Evaluation   Exercise Goals Review Increase Physical Activity;Increase Strength and Stamina;Able to understand and use rate of perceived exertion (RPE) scale;Able to understand and use Dyspnea scale;Knowledge and understanding of Target Heart Rate Range (THRR);Understanding of Exercise Prescription Increase Strength and Stamina;Increase Physical Activity;Able to understand and use rate of perceived exertion (RPE) scale;Able to understand and use Dyspnea scale;Knowledge  and understanding of Target Heart Rate Range (THRR);Understanding of Exercise Prescription Increase Physical Activity;Increase Strength and Stamina;Able to understand and use rate of perceived exertion (RPE) scale;Able to understand and use Dyspnea scale;Knowledge and understanding of Target Heart Rate Range (THRR);Understanding of Exercise Prescription Increase Physical Activity;Increase Strength and Stamina;Able to understand and use rate of perceived exertion (RPE) scale;Able to understand and use Dyspnea scale;Knowledge and understanding of Target Heart Rate Range (THRR);Understanding of Exercise Prescription Increase Physical Activity;Increase Strength and Stamina;Able to understand and use rate of perceived exertion (RPE) scale;Able to understand and use Dyspnea scale;Knowledge and understanding of Target Heart Rate Range (THRR);Understanding of Exercise Prescription   Comments Pt has completed 2 sessions of PR. He seems to enjoy coming to class so far and is eager to get going. He is increasing his speed on the treadmill and RPM on the ellp. He has a bike at home that he rides for 20 minutes a day along with doing weights and bands. He is currenlty exercising at 3.3 METs on the ellp. Will continue to monitor and progress as able. Pt has completed 9 sessions of PR. 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 He  is increasing his speed on the treadmill and RPM on the ellp. He has been tired recently due to [place] but pushes through. He is still exercising at home on days off from class and treatments. He is currently exercising at 3.0 METs on the ellp. Will continue to monitor and progress as able. Pt has completed 3 session of PR. He was ready to get back to class after his surgery. He is progressing each class with his levels. He is currently exercising at 2.38 METs on the treadmill. Will continue to monitor and progress as able. Pt has completed 12 sessions of PR. He continues to progress following his  lobectomy. He is motivated to progress. He is currently exercising at 2.53 METs on the TM. Will continue to monitor and progress as able. Pt has completed 17 sessions of PR. HE continues to progress in the program and is increasing his workloads. He recently had a scan and they found no spots in his lungs but found a spot in his esophagus so he will be starting chemo and radation soon. He is currently exercising at 2.53 METs on the treadmill. Will continue to monitor and progress as able.   Expected Outcomes Through exercise at home and at rehab, the patient will meet their stated goals. Through exercise at home and at rehab, the patient will meet their stated goals. Through exercise at home and at rehab, the patient will meet their stated goals. Through exercise at home and at rehab, the patient will meet their stated goals. Through exercise at home and at rehab, the patient will meet their stated goals.            Discharge Exercise Prescription (Final Exercise Prescription Changes):  Exercise Prescription Changes - 03/05/22 1500       Response to Exercise   Blood Pressure (Admit) 114/70    Blood Pressure (Exercise) 132/62    Blood Pressure (Exit) 122/64    Heart Rate (Admit) 81 bpm    Heart Rate (Exercise) 93 bpm    Heart Rate (Exit) 82 bpm    Oxygen Saturation (Admit) 94 %    Oxygen Saturation (Exercise) 93 %    Oxygen Saturation (Exit) 96 %    Rating of Perceived Exertion (Exercise) 12    Perceived Dyspnea (Exercise) 12    Duration Continue with 30 min of aerobic exercise without signs/symptoms of physical distress.    Intensity THRR unchanged      Progression   Progression Continue to progress workloads to maintain intensity without signs/symptoms of physical distress.      Resistance Training   Training Prescription Yes    Weight 5    Reps 10-15    Time 10 Minutes      Treadmill   MPH 2    Grade 0    Minutes 17    METs 2.53      Recumbant Elliptical   Level 4     RPM 34    Minutes 22    METs 2.1             Nutrition:  Target Goals: Understanding of nutrition guidelines, daily intake of sodium '1500mg'$ , cholesterol '200mg'$ , calories 30% from fat and 7% or less from saturated fats, daily to have 5 or more servings of fruits and vegetables.  Biometrics:  Pre Biometrics - 12/27/21 1335       Pre Biometrics   Height 6' (1.829 m)    Weight 81.5 kg    Waist Circumference 40  inches    Hip Circumference 39 inches    Waist to Hip Ratio 1.03 %    BMI (Calculated) 24.36    Triceps Skinfold 10 mm    % Body Fat 24.7 %    Grip Strength 28.6 kg    Flexibility 0 in    Single Leg Stand 0 seconds              Nutrition Therapy Plan and Nutrition Goals:  Nutrition Therapy & Goals - 12/27/21 1314       Personal Nutrition Goals   Comments Patient scored 43 on his diet assessment. Handout provided and explained regarding healthier choices. We offer 2 educational sessions on heart healthy nutrition and assistance with RD referral if patient is interested.      Intervention Plan   Intervention Nutrition handout(s) given to patient.    Expected Outcomes Short Term Goal: Understand basic principles of dietary content, such as calories, fat, sodium, cholesterol and nutrients.             Nutrition Assessments:  Nutrition Assessments - 12/27/21 1314       MEDFICTS Scores   Pre Score 43            MEDIFICTS Score Key: ?70 Need to make dietary changes  40-70 Heart Healthy Diet ? 40 Therapeutic Level Cholesterol Diet   Picture Your Plate Scores: [id number] Unhealthy dietary pattern with much room for improvement. 41-50 Dietary pattern unlikely to meet recommendations for good health and room for improvement. 51-60 More healthful dietary pattern, with some room for improvement.  >60 Healthy dietary pattern, although there may be some specific behaviors that could be improved.    Nutrition Goals Re-Evaluation:   Nutrition Goals  Discharge (Final Nutrition Goals Re-Evaluation):   Psychosocial: Target Goals: Acknowledge presence or absence of significant depression and/or stress, maximize coping skills, provide positive support system. Participant is able to verbalize types and ability to use techniques and skills needed for reducing stress and depression.  Initial Review & Psychosocial Screening:  Initial Psych Review & Screening - 12/27/21 1317       Initial Review   Current issues with None Identified      Family Dynamics   Good Support System? Yes      Barriers   Psychosocial barriers to participate in program There are no identifiable barriers or psychosocial needs.      Screening Interventions   Interventions Encouraged to exercise;Provide feedback about the scores to participant    Expected Outcomes Short Term goal: Identification and review with participant of any Quality of Life or Depression concerns found by scoring the questionnaire.             Quality of Life Scores:  Quality of Life - 12/27/21 1338       Quality of Life   Select Quality of Life      Quality of Life Scores   Health/Function Pre 27.48 %    Socioeconomic Pre 24.81 %    Psych/Spiritual Pre 30 %    Family Pre 30 %    GLOBAL Pre 30 %            Scores of 19 and below usually indicate a poorer quality of life in these areas.  A difference of  2-3 points is a clinically meaningful difference.  A difference of 2-3 points in the total score of the Quality of Life Index has been associated with significant improvement in overall quality of life, self-image, physical  symptoms, and general health in studies assessing change in quality of life.   PHQ-9: Review Flowsheet  More data exists      12/27/2021 09/24/2021 09/05/2021 08/29/2021 09/18/2020  Depression screen PHQ 2/9  Decreased Interest 0 0 0 0 0  Down, Depressed, Hopeless 0 0 0 0 0  PHQ - 2 Score 0 0 0 0 0  Altered sleeping 0 0 0 0 -  Tired, decreased energy 0 0  0 0 -  Change in appetite 0 0 0 0 -  Feeling bad or failure about yourself  0 0 0 0 -  Trouble concentrating 0 0 0 0 -  Moving slowly or fidgety/restless 0 0 0 0 -  Suicidal thoughts 0 0 0 0 -  PHQ-9 Score 0 0 0 0 -  Difficult doing work/chores - Not difficult at all - Not difficult at all -   Interpretation of Total Score  Total Score Depression Severity:  1-4 = Minimal depression, 5-9 = Mild depression, 10-14 = Moderate depression, 15-19 = Moderately severe depression, 20-27 = Severe depression   Psychosocial Evaluation and Intervention:  Psychosocial Evaluation - 12/27/21 1318       Psychosocial Evaluation & Interventions   Interventions Encouraged to exercise with the program and follow exercise prescription;Stress management education;Relaxation education    Comments Patient has no psychosocial barriers identified to participate in PR at his orientation visit. His PHQ-9 score was 0.  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 He has cancer in his left lung and 18 out of 20 lymph nodes taken during the surgery were postive for cancer. Patient says he was told his case is complicated and they have not decided how to treat the remaining cancer moving forward. He says they have been talking about immunotherapy but no decision has been made. He says he is more SOB now than before his surgery. He hopes this will improve. He has a good support system with his wife. He is ready to start the program hoping to get back to his normal strength level.    Expected Outcomes Pt will continue to have no identifiable psychosocial issues.    Continue Psychosocial Services  No Follow up required             Psychosocial Re-Evaluation:  Psychosocial Re-Evaluation     [place] Name 09/19/21 1133 10/08/21 1507 01/04/22 0844 01/28/22 1501 02/25/22 1107     Psychosocial Re-Evaluation   Current issues with None Identified None Identified None  Identified None Identified None Identified   Comments Patient is new to the program completing 3 sessions. He continues to have no psychosocial barriers or issues identified. We will continue to monitor. Patient has completed 7 sessions.  He continues to have no psychosocial barriers or issues identified.  He seems to enjoy coming to class and very interactiver with class and staff.  He demonstrates an interest in improving his health.   We will continue to monitor as he progresses in the program. Patient has started PR over, due to having surgery.  He has attended 2 sessions.  He was referred via Dr. Williemae Natter with Lung cancer.  He continues to have no psychosocial barriers or issues identified.  He seems to enjoy coming to class and demonstrates a very positive outlook with class and staff.  He demonstrates an interest in improving his health.   We will continue to monitor as he progresses in the  program. Patient has attended 8 sessions and continues to have no psychosocial issues identified.    He was referred via Dr. Williemae Natter with Lung cancer.   He continues to have no psychosocial barriers or issues identified.  He seems to enjoy coming to class and demonstrates a very positive outlook and very interative with others in class and staff.  He demonstrates an interest in improving his health.   We will continue to monitor as he progresses in the program. Patient has attended 15 sessions and continues to have no psychosocial issues identified.    He seems to enjoy coming to class and demonstrates a very positive outlook and very interative with others in class and staff.   He demonstrates an interest in improving his health and works hard in [place].   We will continue to monitor as he progresses in the program.   Expected Outcomes Patient will continue to have no psychosocial barriers or issues identified. Patient will continue to have no psychosocial barriers or issues identified. Patient will continue to have no  psychosocial barriers or issues identified. Patient will continue to have no psychosocial barriers or issues identified. Patient will continue to have no psychosocial barriers or issues identified.   Interventions Encouraged to attend Pulmonary Rehabilitation for the exercise;Relaxation education;Encouraged to attend Cardiac Rehabilitation for the exercise Encouraged to attend Pulmonary Rehabilitation for the exercise;Relaxation education;Encouraged to attend Cardiac Rehabilitation for the exercise Encouraged to attend Pulmonary Rehabilitation for the exercise;Relaxation education;Encouraged to attend Cardiac Rehabilitation for the exercise Encouraged to attend Pulmonary Rehabilitation for the exercise;Relaxation education;Encouraged to attend Cardiac Rehabilitation for the exercise Encouraged to attend Pulmonary Rehabilitation for the exercise;Relaxation education;Encouraged to attend Cardiac Rehabilitation for the exercise   Continue Psychosocial Services  No Follow up required No Follow up required No Follow up required No Follow up required No Follow up required            Psychosocial Discharge (Final Psychosocial Re-Evaluation):  Psychosocial Re-Evaluation - 02/25/22 1107       Psychosocial Re-Evaluation   Current issues with None Identified    Comments Patient has attended 15 sessions and continues to have no psychosocial issues identified.    He seems to enjoy coming to class and demonstrates a very positive outlook and very interative with others in class and staff.   He demonstrates an interest in improving his health and works hard in [place].   We will continue to monitor as he progresses in the program.    Expected Outcomes Patient will continue to have no psychosocial barriers or issues identified.    Interventions Encouraged to attend Pulmonary Rehabilitation for the exercise;Relaxation education;Encouraged to attend Cardiac Rehabilitation for the exercise    Continue Psychosocial  Services  No Follow up required              Education: Education Goals: Education classes will be provided on a weekly basis, covering required topics. Participant will state understanding/return demonstration of topics presented.  Learning Barriers/Preferences:  Learning Barriers/Preferences - 12/27/21 1315       Learning Barriers/Preferences   Learning Barriers None    Learning Preferences Audio;Written Material             Education Topics: How Lungs Work and Diseases: - Discuss the anatomy of the lungs and diseases that can affect the lungs, such as COPD. Flowsheet Row PULMONARY REHAB OTHER RESPIRATORY from 02/28/2022 in [place]  Date 10/11/21  Educator DF  Instruction Review Code 2- Demonstrated Understanding  Exercise: -Discuss the importance of exercise, FITT principles of exercise, normal and abnormal responses to exercise, and how to exercise safely.   Environmental Irritants: -Discuss types of environmental irritants and how to limit exposure to environmental irritants. Flowsheet Row PULMONARY REHAB OTHER RESPIRATORY from 02/28/2022 in [place]  Date 10/18/21  Instruction Review Code 1- Verbalizes Understanding       Meds/Inhalers and oxygen: - Discuss respiratory medications, definition of an inhaler and oxygen, and the proper way to use an inhaler and oxygen. Flowsheet Row PULMONARY REHAB OTHER RESPIRATORY from 02/28/2022 in [place]  Date 01/24/22  Educator HB       Energy Saving Techniques: - Discuss methods to conserve energy and decrease shortness of breath when performing activities of daily living.  Flowsheet Row PULMONARY REHAB OTHER RESPIRATORY from 02/28/2022 in [place]  Date 11/01/21  Educator HB  Instruction Review Code 1- Verbalizes Understanding       Bronchial Hygiene / Breathing Techniques: - Discuss breathing  mechanics, pursed-lip breathing technique,  proper posture, effective ways to clear airways, and other functional breathing techniques Flowsheet Row PULMONARY REHAB OTHER RESPIRATORY from 02/28/2022 in [place]  Date 11/08/21  Educator HB  Instruction Review Code 1- Research scientist (medical): - Provides group verbal and written instruction about the health risks of elevated stress, cause of high stress, and healthy ways to reduce stress. Flowsheet Row PULMONARY REHAB OTHER RESPIRATORY from 02/28/2022 in [place]  Date 02/14/22  Educator HB  Instruction Review Code 1- Verbalizes Understanding       Nutrition I: Fats: - Discuss the types of cholesterol, what cholesterol does to the body, and how cholesterol levels can be controlled.   Nutrition II: Labels: -Discuss the different components of food labels and how to read food labels. Flowsheet Row PULMONARY REHAB OTHER RESPIRATORY from 02/28/2022 in [place]  Date 02/28/22  Educator DF  Instruction Review Code 1- Verbalizes Understanding       Respiratory Infections: - Discuss the signs and symptoms of respiratory infections, ways to prevent respiratory infections, and the importance of seeking medical treatment when having a respiratory infection. Flowsheet Row PULMONARY REHAB OTHER RESPIRATORY from 02/28/2022 in [place]  Date 09/13/21  Educator DM  Instruction Review Code 1- Verbalizes Understanding       Stress I: Signs and Symptoms: - Discuss the causes of stress, how stress may lead to anxiety and depression, and ways to limit stress. Flowsheet Row PULMONARY REHAB OTHER RESPIRATORY from 02/28/2022 in [place]  Date 09/20/21  Educator [place]  Instruction Review Code 1- Verbalizes Understanding       Stress II: Relaxation: -Discuss relaxation techniques to limit  stress. Flowsheet Row PULMONARY REHAB OTHER RESPIRATORY from 02/28/2022 in [place]  Date 09/27/21  Educator pb  Instruction Review Code 1- Verbalizes Understanding       Oxygen for Home/Travel: - Discuss how to prepare for travel when on oxygen and proper ways to transport and store oxygen to ensure safety. Flowsheet Row PULMONARY REHAB OTHER RESPIRATORY from 02/28/2022 in [place]  Date 10/04/21  Educator DF  Instruction Review Code 2- Demonstrated Understanding       Knowledge Questionnaire Score:  Knowledge Questionnaire Score - 12/27/21 1313       Knowledge Questionnaire Score   Pre Score 15/18  Core Components/Risk Factors/Patient Goals at Admission:  Personal Goals and Risk Factors at Admission - 12/27/21 1315       Core Components/Risk Factors/Patient Goals on Admission    Weight Management Weight Maintenance    Improve shortness of breath with ADL's Yes    Intervention Provide education, individualized exercise plan and daily activity instruction to help decrease symptoms of SOB with activities of daily living.    Expected Outcomes Short Term: Improve cardiorespiratory fitness to achieve a reduction of symptoms when performing ADLs;Long Term: Be able to perform more ADLs without symptoms or delay the onset of symptoms    Increase knowledge of respiratory medications and ability to use respiratory devices properly  Yes    Intervention Provide education and demonstration as needed of appropriate use of medications, inhalers, and oxygen therapy.    Expected Outcomes Short Term: Achieves understanding of medications use. Understands that oxygen is a medication prescribed by physician. Demonstrates appropriate use of inhaler and oxygen therapy.;Long Term: Maintain appropriate use of medications, inhalers, and oxygen therapy.    Personal Goal Other Yes    Personal Goal Patient wants to be able to return to his  normal strength.    Intervention Patient will attend PR 2 days/week with education and exercise.    Expected Outcomes Patient will complete the program meeting both personal and program goals.             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 He has completed 3 sessions and is doing well in the program with consistent attendance. His personal goals for the program are to get stronger and prepare his lungs for surgery for cancer. He is currently receiving chemotherapy. We will continue to monitor his progress as he works towards meeting these personal goals. Patient has completed 7 sessions and is doing well in the program with consistent attendance. His personal goals for the program are to get stronger and prepare his lungs for surgery for cancer. He is currently receiving chemotherapy.  He is tolerating exercise well on the Tm and recumbent elliptical and his O2 sats averaging around 93%-96% while exercising on room air.  We will continue to encourage his progress as he continues to works towards meeting these personal goals. Patient has started PR over, due to surgery.  He attended back in October and restarting over.  He has attended 2 sessions and is doing well in the program.  His personal goals for the program are to get stronger and get back to his normal strength level.  He is tolerating exercise well on the Tm and recumbent elliptical and his O2 sats averaging around 94%-95% while exercising on room air.  We will continue to encourage his progress as he continues to works towards meeting these personal goals. Patient has attended 8 sessions and is doing well in the  program.  His personal goals for the program are to get stronger and get back to his normal strength level.  He is tolerating exercise well on the Tm and recumbent elliptical and his O2 sats averaging around 94%-98% while exercising and no need for supplemenetal oxygen at this time.  We will help with  develping effiecient breathing techniques such as purse lipped breathing and practice self pacing with increased activity.   We will continue to encourage his progress as he continues to works towards meeting these personal goals. Patient has attended 15 sessions and is doing well in the program.  His personal goals for the program are to get stronger and get back to his normal strength level.  He is tolerating exercise well on the Tm and recumbent elliptical and his O2 sats averaging around 92%-96% while exercising and no need for supplemenetal oxygen at this time.  We will help with develping effiecient breathing techniques such as purse lipped breathing and practice self pacing with increased activity.   We will continue to encourage his progress as he continues to works towards meeting these personal goals.   Expected Outcomes Patient will complete the program meeting both personal and program goals. Patient will complete the program meeting both personal and program goals. Patient will complete the program meeting both personal and program goals. Patient will complete the program meeting both personal and program goals. Patient will complete the program meeting both personal and program goals.            Core Components/Risk Factors/Patient Goals at Discharge (Final Review):   Goals and Risk Factor Review - 02/25/22 1109       Core Components/Risk Factors/Patient Goals Review   Personal Goals Review Other    Review Patient has attended 15 sessions and is doing well in the program.  His personal goals for the program are to get stronger and get back to his normal strength level.  He is tolerating  exercise well on the Tm and recumbent elliptical and his O2 sats averaging around 92%-96% while exercising and no need for supplemenetal oxygen at this time.  We will help with develping effiecient breathing techniques such as purse lipped breathing and practice self pacing with increased activity.   We will continue to encourage his progress as he continues to works towards meeting these personal goals.    Expected Outcomes Patient will complete the program meeting both personal and program goals.             ITP Comments:  ITP Comments     Row Name 11/19/21 1455           ITP Comments Pt discharged from PR on 11/2/023 after 16 sessions. He was participating in [place] to prepare his body for his lobectomy to remove cancer from his lung. He underwent lobectomy surgery on 11/13/2021 at [facility]. I explained to pt that if he would like to do PR again after he has recovered from his surgery, that he will need to get another referral from his doctor.                Comments: ITP REVIEW Pt is making expected progress toward pulmonary rehab goals after completing 18 sessions. Recommend continued exercise, life style modification, education, and utilization of breathing techniques to increase stamina and strength and decrease shortness of breath with exertion.

## 2022-03-06 NOTE — Progress Notes (Signed)
Labs for new pt appt

## 2022-03-06 NOTE — Progress Notes (Signed)
Radiation Oncology         [phone] ________________________________  Initial Outpatient Consultation  Name: Patrick Harris MRN: [id number]  Date: 03/07/2022  DOB: 03/04/1945  [id number], Claudina Lick, MD  Varney Biles Melson,*   REFERRING PHYSICIAN: Varney Biles Melson,*  DIAGNOSIS: The primary encounter diagnosis was Malignant neoplasm of right upper lobe of lung ([place]). A diagnosis of Non-small cell cancer of right lung [facility]) was also pertinent to this visit.  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 male who is accompanied by supportive wife. he is seen as a courtesy of Dr. Loletta Specter for an opinion concerning radiation therapy as part of management for his recently diagnosed precarinal nodal metastasis from lung cancer primary.   The patient presented for a screening chest CT on 06/05/21 which showed bilateral pulmonary nodules in both upper lobes and in the superior segment of right lower lobe, as well as a few enlarged lymph nodes in the mediastinum.   The patient accordingly underwent a bronchoscopy on 07/02/21. Biopsy of the RUL collected showed malignant cells consistent with squamous cell carcinoma; p63+. Biopsy of LUL also performed showed non-small cell carcinoma favoring adenocarcinoma.   MRI of the brain on 07/23/21 showed no evidence of intracranial metastatic disease.   PET scan also performed on 07/23/21 showed: hypermetabolism associated with bilateral upper lobe nodules/malignancies, a hypermetabolic right hilar lymph node compatible with nodal metastatic disease, and a subpleural nodule of the superior portion of the right lower lobe  with mild FDG uptake below mediastinal blood pool possibly concerning for an indolent primary lung malignancy. PET otherwise showed no evidence of metastatic disease in the abdomen or pelvis.   In light of PET findings, the patient underwent an additional bronchoscopy for nodal biopsies on 08/07/21. Biopsy of lymph node 11R showed metastatic non-small cell carcinoma. Biopsies of a station 7 node and lymph node 4R showed no evidence of malignancy. Fluid from right upper lobe lavage was also sent for cytology and showed rare atypical cells.   Accordingly, the patient was referred to Dr. Loletta Specter who presented his case at the [facility] on 08/15/21. Disposition concluded was to neoadjuvant chemotherapy followed by lobectomy. The patient completed chemotherapy consisting of carboplatin/paclitaxel/nivolumab x 3 cycles, delivered from 09/11/21-10/23/21, under the care of Dr. Wenda Overland. The patient tolerated systemic treatment well over than neuropathy (predominantly in the right upper extremity), fatigue, and taste changes.    The patient was promptly referred to [facility] and opted to proceed with a right upper lobectomy and lymphadenectomy on 11/13/21 under the care of Dr. Williemae Natter. Pathology from the procedure revealed two synchronous primary carcinomas of the RUL consisting of adenocarcinoma and squamous cell carcinoma.  -- Pathology from the poorly differentiated adenocarcinoma also showed extensive LVI and residual viable tumor measuring approximately 0.9 cm in the greatest extent with extensive lymphangitic spreading. Nodal status of 5/6 lymph nodes positive for metastatic adenocarcinoma.   -- Pathology from the site of [facility] showed residual viable tumor measuring 0.2 cm in the greatest extent.  -- Nodal status of 4/4 anthracotic lymph nodes negative for carcinoma; 8/12 level 11 lymph nodes positive for metastatic adenocarcinoma; 1/1 level 4R lymph node positive for metastatic  adenocarcinoma; and 1/1 anthracotic lymph node negative for carcinoma.    His surgery was complicated by pneumonia, subcutaneous emphysema, and left sided pneumothorax. All of these have since resolved.    Post-op, the patient was referred to pulmonary rehab which he continues to attend on a regular basis.   Follow-up chest CT on 12/05/21 showed stable post surgical changes, a decrease in the solid component of the irregular nodule/opacity in medial left lung apex, and new multifocal ground glass and nodular pulmonary opacities.    Chest x-ray performed at [facility] on 12/17/2021 showed improving subcutaneous emphysema, no evidence of pneumothorax, and stability of the right apical opacity.   Restaging PET scan on 01/18/22 showed an intensely hypermetabolic precarinal node concerning for metastasis and other mildly active bilateral hilar nodes favored to be reactive in etiology.   The patient has been referred back to [place] and has been cleared to undergo bronchoscopy for biopsy of the precarinal node.  Bronchial lavage performed on 02/26/22 to the LUL revealed no evidence of malignancy. FNA on the same day performed of the 4R lymph node was favored to be metastatic adenocarcinoma. Dr. Loletta Specter has discussed the role of radiation +/- sensitizing chemotherapy. Dr. Loletta Specter will present his case at the next [facility] for further discussion of treatment options.   He reports that is breathing is doing fine since taking his antibiotic for his lung infection. He denies any increasing shortness of breath, productive cough, hemoptysis, chest pain, fevers, or unintended weight loss.   Due to upcoming insurance coverage issues, the patient wished to have his treatment switched to the [facility] health system.  PREVIOUS RADIATION THERAPY: No  PAST MEDICAL HISTORY:  Past Medical History:  Diagnosis Date   Arthritis    CAD (coronary artery disease)    CABG with LIMA to LAD and RIMA to RCA August 2000  - Dr. Roxan Hockey   Cancer [facility])    basal cell   CHF (congestive heart failure) (HCC)    COPD (chronic obstructive pulmonary disease) (HCC)    Elevated glycated hemoglobin    Essential hypertension    GERD (gastroesophageal reflux disease)    Heart murmur    Hyperlipidemia    Hypopituitarism ([place])    Lung cancer (HCC)    Osteopenia    Dr Wilson Singer   Pancreatitis    Peyronie disease    Testosterone deficiency    Dr Wilson Singer    PAST SURGICAL HISTORY: Past Surgical History:  Procedure Laterality Date   Bone spur  2006   5th toe bilaterally   BRONCHIAL BIOPSY  07/02/2021   Procedure: BRONCHIAL BIOPSIES;  Surgeon: Collene Gobble, MD;  Location: [facility] ENDOSCOPY;  Service: Pulmonary;;   BRONCHIAL BRUSHINGS  07/02/2021   Procedure: BRONCHIAL BRUSHINGS;  Surgeon: Collene Gobble, MD;  Location: [place];  Service: Pulmonary;;   BRONCHIAL NEEDLE ASPIRATION BIOPSY  07/02/2021   Procedure: BRONCHIAL NEEDLE ASPIRATION BIOPSIES;  Surgeon: Collene Gobble, MD;  Location: [place];  Service: Pulmonary;;   COLONOSCOPY  2007   negative; Dr Sharlett Iles   CORONARY ARTERY BYPASS GRAFT  2001   2 vessels   FIDUCIAL MARKER PLACEMENT  07/02/2021   Procedure: FIDUCIAL MARKER PLACEMENT;  Surgeon: Collene Gobble, MD;  Location: [facility] ENDOSCOPY;  Service: Pulmonary;;   fracture heel  1967   Bil/ due to fall off ladder   Fractured calcaneus  1969   bilaterally w/ ankle fusion   [place]   right inguinal hernia  KNEE SURGERY Left    LOBECTOMY Right 11/13/2021   MENISECTOMY  2007   R medial, Dr. Lorin Mercy   PORTA CATH INSERTION     SHOULDER SURGERY  1994   left   UPPER GASTROINTESTINAL ENDOSCOPY  2007   GERD, Dr Sharlett Iles   VASECTOMY     VIDEO BRONCHOSCOPY WITH RADIAL ENDOBRONCHIAL ULTRASOUND  07/02/2021   Procedure: VIDEO BRONCHOSCOPY WITH RADIAL ENDOBRONCHIAL ULTRASOUND;  Surgeon: Collene Gobble, MD;  Location: [facility] ENDOSCOPY;  Service: Pulmonary;;    FAMILY HISTORY:  Family History   Problem Relation Age of Onset   Heart attack Father 8   Diabetes Mother    Stroke Mother 37   Hypertension Mother    Leukemia Sister    Colon cancer Paternal Uncle    Aortic aneurysm Brother        abdominal   Heart disease Brother    Atrial fibrillation Brother    Cancer Maternal Uncle         X3:prostate , renal, bone    Parkinson's disease Sister    Stroke Sister    Stroke Sister    Stroke Sister    Atrial fibrillation Sister    Esophageal cancer Neg Hx    Rectal cancer Neg Hx    Stomach cancer Neg Hx     SOCIAL HISTORY:  Social History   Tobacco Use   Smoking status: Former    Packs/day: 0.25    Types: Cigarettes    Quit date: 05/2021    Years since quitting: 0.8   Smokeless tobacco: Never  Vaping Use   Vaping Use: Never used  Substance Use Topics   Alcohol use: No   Drug use: No    ALLERGIES:  Allergies  Allergen Reactions   Latex Dermatitis    Contact  dermatitis   Bromocriptine Nausea Only    Dry heaves   Zestril [Lisinopril] Other (See Comments)    Leg pain, inc HR, dizziness, headache   Diovan [Valsartan] Nausea Only   Hydrochlorothiazide Other (See Comments)    Possibly caused pancreatitis 2018   Norvasc [Amlodipine] Other (See Comments)    Edema    Zocor [Simvastatin]     Joint pain    MEDICATIONS:  Current Outpatient Medications  Medication Sig Dispense Refill   amoxicillin-clavulanate (AUGMENTIN) 875-125 MG tablet Take 1 tablet by mouth 2 (two) times daily.     acetaminophen (TYLENOL) 500 MG tablet Take 500 mg by mouth every 6 (six) hours as needed (for pain.).     aspirin EC 81 MG tablet Take 1 tablet (81 mg total) by mouth at bedtime. Okay to restart this medication on 07/03/2021 30 tablet 11   Cholecalciferol (VITAMIN D3) 2000 UNITS TABS Take 2,000 Units by mouth in the morning and at bedtime.     Coenzyme Q10 (COQ-10) 100 MG CAPS Take 100 mg by mouth in the morning.     Cyanocobalamin (B-12 PO) Take 1 tablet by mouth daily.      hydrALAZINE (APRESOLINE) 25 MG tablet Take 1 tablet (25 mg total) by mouth 3 (three) times daily. 270 tablet 3   irbesartan (AVAPRO) 300 MG tablet Take 1 tablet (300 mg total) by mouth daily. 90 tablet 3   nitroGLYCERIN (NITROSTAT) 0.4 MG SL tablet Place 1 tablet (0.4 mg total) under the tongue every 5 (five) minutes x 3 doses as needed for chest pain (if no relief after 2nd dose, proceed to ED or call 911). 25 tablet 3   Omega-3 Fatty  Acids (OMEGA-3 FISH OIL PO) Take 1,000 mg by mouth in the morning.     omeprazole (PRILOSEC) 40 MG capsule Take 1 capsule (40 mg total) by mouth in the morning.     ondansetron (ZOFRAN) 8 MG tablet Take 8 mg by mouth every 8 (eight) hours as needed for vomiting or nausea.     Polyethyl Glycol-Propyl Glycol (LUBRICANT EYE DROPS) 0.4-0.3 % SOLN Place 1-2 drops into both eyes 3 (three) times daily as needed (dry/irritated eyes.).     rosuvastatin (CRESTOR) 20 MG tablet TAKE HALF A TABLET (10 MG) BY MOUTH ONCE DAILY. 45 tablet 3   tadalafil (CIALIS) 5 MG tablet Take 5 mg by mouth daily as needed for erectile dysfunction.     tamsulosin (FLOMAX) 0.4 MG CAPS capsule Take 0.4 mg by mouth in the morning.     Testosterone 20.25 MG/ACT (1.62%) GEL Apply 2 Pump topically in the morning.     vitamin C (ASCORBIC ACID) 500 MG tablet Take 500 mg by mouth in the morning.     No current facility-administered medications for this encounter.    REVIEW OF SYSTEMS: As per HPI.    PHYSICAL EXAM:  height is 6' (1.829 m) and weight is 185 lb (83.9 kg). His temperature is 97.9 F (36.6 C). His blood pressure is 124/65 and his pulse is 78. His respiration is 20 and oxygen saturation is 96%.   General: Alert and oriented, in no acute distress HEENT: Head is normocephalic. Extraocular movements are intact. Oropharynx is clear. Neck: Neck is supple, no palpable cervical or supraclavicular lymphadenopathy. Heart: Regular in rate and rhythm with no murmurs, rubs, or gallops. Chest: Clear to  auscultation bilaterally, with no rhonchi, wheezes, or rales. Abdomen: Soft, nontender, nondistended, with no rigidity or guarding. Extremities: No cyanosis or edema. Lymphatics: see Neck Exam Skin: No concerning lesions. Musculoskeletal: symmetric strength and muscle tone throughout. Port in place. Neurologic: Cranial nerves II through XII are grossly intact. No obvious focalities. Speech is fluent. Coordination is intact. Psychiatric: Judgment and insight are intact. Affect is appropriate.   ECOG = 1  0 - Asymptomatic (Fully active, able to carry on all predisease activities without restriction)  1 - Symptomatic but completely ambulatory (Restricted in physically strenuous activity but ambulatory and able to carry out work of a light or sedentary nature. For example, light housework, office work)  2 - Symptomatic, <50% in bed during the day (Ambulatory and capable of all self care but unable to carry out any work activities. Up and about more than 50% of waking hours)  3 - Symptomatic, >50% in bed, but not bedbound (Capable of only limited self-care, confined to bed or chair 50% or more of waking hours)  4 - Bedbound (Completely disabled. Cannot carry on any self-care. Totally confined to bed or chair)  5 - Death   Patrick Harris MM, Creech RH, Tormey DC, et al. [phone]). "Toxicity and response criteria of the [facility]". [place] Oncol. 5 (6): 649-55  LABORATORY DATA:  Lab Results  Component Value Date   WBC 7.1 07/02/2021   HGB 17.3 (H) 07/02/2021   HCT 51.3 07/02/2021   MCV 95.0 07/02/2021   PLT 162 07/02/2021   NEUTROABS 4.0 05/25/2021   Lab Results  Component Value Date   NA 136 07/02/2021   K 4.3 07/02/2021   CL 103 07/02/2021   CO2 22 07/02/2021   GLUCOSE 100 (H) 07/02/2021   BUN 17 07/02/2021   CREATININE 0.98  07/02/2021   CALCIUM 9.0 07/02/2021      RADIOGRAPHY: VAS [place] ABI WITH/WO TBI  Result Date: 03/05/2022  LOWER EXTREMITY DOPPLER  STUDY Patient Name:  Patrick Harris  Date of Exam:   03/05/2022 Medical Rec #: [id number]          Accession #:    [id number] Date of Birth: 26-Apr-1945           Patient Gender: M Patient Age:   16 years Exam Location:  [facility] Procedure:      VAS [place] ABI WITH/WO TBI Referring Phys: Jamelle Haring --------------------------------------------------------------------------------  Indications: Claudication, and peripheral artery disease. High Risk Factors: Hypertension, hyperlipidemia, coronary artery disease.  Performing Technologist: Ralene Cork RVT  Examination Guidelines: A complete evaluation includes at minimum, Doppler waveform signals and systolic blood pressure reading at the level of bilateral brachial, anterior tibial, and posterior tibial arteries, when vessel segments are accessible. Bilateral testing is considered an integral part of a complete examination. Photoelectric Plethysmograph (PPG) waveforms and toe systolic pressure readings are included as required and additional duplex testing as needed. Limited examinations for reoccurring indications may be performed as noted.  ABI Findings: +---------+------------------+-----+----------+--------+ Right    Rt Pressure (mmHg)IndexWaveform  Comment  +---------+------------------+-----+----------+--------+ Brachial 142                                       +---------+------------------+-----+----------+--------+ PTA      113               0.80 biphasic           +---------+------------------+-----+----------+--------+ DP       108               0.76 monophasic         +---------+------------------+-----+----------+--------+ Great Toe93                0.65                    +---------+------------------+-----+----------+--------+ +---------+------------------+-----+--------+-------+ Left     Lt Pressure (mmHg)IndexWaveformComment +---------+------------------+-----+--------+-------+ Brachial 140                                     +---------+------------------+-----+--------+-------+ PTA      131               0.92 biphasic        +---------+------------------+-----+--------+-------+ DP       125               0.88 biphasic        +---------+------------------+-----+--------+-------+ Great Toe94                0.66                 +---------+------------------+-----+--------+-------+ +-------+-----------+-----------+------------+------------+ ABI/TBIToday's ABIToday's TBIPrevious ABIPrevious TBI +-------+-----------+-----------+------------+------------+ Right  0.8        0.65       0.68        0.53         +-------+-----------+-----------+------------+------------+ Left   0.92       0.66       0.82        0.62         +-------+-----------+-----------+------------+------------+  Previous ABI on 10/03/20.  Summary: Right: Resting right ankle-brachial index indicates mild right  lower extremity arterial disease. The right toe-brachial index is abnormal. Left: Resting left ankle-brachial index indicates mild left lower extremity arterial disease. The left toe-brachial index is abnormal. *See table(s) above for measurements and observations.  Electronically signed by Monica Martinez MD on 03/05/2022 at 9:29:33 AM.    Final    DG Chest 2 View  Result Date: 02/20/2022 CLINICAL DATA:  Shortness of breath, hypoxia, history of lobectomy EXAM: CHEST - 2 VIEW COMPARISON:  12/04/2021 FINDINGS: Normal heart size and vascularity. Remote median sternotomy noted. Postop changes in the right hemithorax. Similar chronic pleuroparenchymal scarring in the right hemithorax with volume loss. Stable position of the right IJ power port catheter, tip mid SVC level. No new superimposed pneumonia, collapse or consolidation. Previous upper lobe lung nodules by PET-CT are not visualized by plain radiography. Degenerative changes throughout the spine. No acute osseous finding. Trachea midline.  IMPRESSION: Stable chronic and postoperative findings as above. No interval change or superimposed acute process by plain radiography. Electronically Signed   By: Jerilynn Mages.  Shick M.D.   On: 02/20/2022 12:00      IMPRESSION: Locally recurrent/progressive stage III adenocarcinoma involving the 4R lymph node  History of synchronous primaries of adenocarcinoma & squamous cell carcinoma of the RUL, and non-small cell carcinoma favoring adenocarcinoma of LUL: s/p neoadjuvant chemotherapy and right upper lobectomy w/ lymphadenectomy 11/2021.  Recent PET scan with an intensely hypermetabolic precarinal node concerning for metastasis (January 2024). Bronchoscopy from 02/26/2022 showed adenocarcinoma in the 4R lymph node. CT scan on 12/05/21 showed irregular nodule in the medial left lung apex although not hypermetabolic on PET and negative via biopsy. Patient is a good candidate for concurrent chemoradiation to the 4R lymph node/mediastinum and suspicious LUL mass to decrease his risk of disease recurrence.   Today, I talked to the patient and family about the findings and work-up thus far.  We discussed the natural history of recurrent/progressive adenocarcinoma and general treatment, highlighting the role of radiotherapy in the management.  We discussed the available radiation techniques, and focused on the details of logistics and delivery.  We reviewed the anticipated acute and late sequelae associated with radiation in this setting.  The patient was encouraged to ask questions that I answered to the best of my ability. A patient consent form was discussed and signed.  We retained a copy for our records.  The patient would like to proceed with radiation therapy.  PLAN: Patient is scheduled to see Dr. Julien Nordmann on 03/12/22 for consultation. We have patient scheduled for CT simulation on 03/11/22. We will tentatively begin treatment planning for concurrent chemoradiation unless Dr. Julien Nordmann has alternative  recommendations.   60 minutes of total time was spent for this patient encounter, including preparation, face-to-face counseling with the patient and coordination of care, physical exam, and documentation of the encounter.   ------------------------------------------------   Leona Singleton, PA   Blair Promise, PhD, MD  This document serves as a record of services personally performed by Gery Pray, MD. It was created on his behalf by Roney Mans, a trained medical scribe. The creation of this record is based on the scribe's personal observations and the provider's statements to them. This document has been checked and approved by the attending provider.

## 2022-03-06 NOTE — Progress Notes (Incomplete)
Thoracic Location of Tumor / Histology:  Malignant neoplasm of right upper lobe of lung   Patient presented symptoms of:  77 year old former smoker (37 pack years) with history of CAD/CABG, chronic CHF, COPD, hypertension, hypopituitarism.  He falls just outside the 15-year cutoff for lung cancer screening program but based on suspicion underwent a CT chest without contrast 06/05/2021 as below He is largely asymptomatic. He can sometimes evolve some L mid back pain w exertion. He has also noticed some R neck fullness and soreness for the last few weeks. He can sometimes hears some wheeze at night, coughs up some light mucous in am or through the day. No exertional SOB, although he has limited activity do to foot and hip pain. He can walk 200 yrds without SOB.    CT chest 06/05/2021 showed multiple pulmonary nodules including a 2.4 x 2.8 cm noncalcified spiculated anterior right upper lobe nodule, 2.5 x 1.1 cm medial right upper lobe nodule, 1.8 x 1.7 medial left upper lobe spiculated nodule, 1.2 x 0.7 posterior superior segmental right lower lobe pulmonary nodule.  There is severe centrilobular and panlobular emphysema with numerous blebs. 1.5cm subcarinal.   Biopsies revealed:  Authorizing Provider:  Alden Benjamin, Collected:           02/26/2022 1529                                    MD                                                                         Ordering Location:     [facility] and    Received:            02/27/2022 [place]                                    Specialty Procedures                                                       Pathologist:           Nelva Bush, MD                                                   Specimen:    BRONCHIAL LAVAGE, LUL                                                                   Specimen Source A   Bronchial lavage, left upper lobe  Diagnostic Interpretation A  NEGATIVE. NO EVIDENCE OF MALIGNANCY.  Electronically  signed by Nelva Bush, MD on 02/28/2022 at  4:06 PM   ine Needle Aspirate                              Case: [id number]                               Authorizing Provider:  Alden Benjamin, Collected:           02/26/2022 1501                                    MD                                                                         Ordering Location:     [facility] and    Received:            02/26/2022 1735                                    Specialty Procedures                                                       Pathologist:           Steele Berg, MD                                                     Specimen:    Lymph Node, 4R Lymph Node FNA ew/[id number]                                                     Specimen Source A   Lymph node, 4R  Diagnostic Interpretation A DIAGNOSTIC OF MALIGNANCY.  Electronically signed by Steele Berg, MD on 03/01/2022 at  1:11 PM     Metastatic non-small cell carcinoma, please see comment.    Comment: After review of clinical history and cytomorphology, immunohistochemistry was performed on cell block A1 to further characterize the tumor type.  The tumor cells are positive for pancytokeratin.  Very rare single cells are positive for p40, TTF-1 and CK5/6.  The immunohistochemistry profile is not specific. The current case was compared to the patient's prior surgical pathology case [id number]).  The tumor in this specimen has some morphologic overlap with the tumor in level 4 lymph node excision which was diagnosed as poorly differentiated adenocarcinoma.  Based on this morphologic comparison, a metastatic adenocarcinoma  is favored.    Procedure Notes   Endobronchial ultrasound guided fine needle aspiration biopsy of 4R lymph node performed by Dr. Tama High, Bronchoscopist.   Tobacco/Marijuana/Snuff/ETOH use: former smoker  Past/Anticipated interventions by cardiothoracic surgery, if any:  Video Bronchoscopy with  Robotic Assisted Bronchoscopic Navigation    Date of Operation: 07/02/2021    Pre-op Diagnosis: Bilateral pulmonary nodules, mediastinal adenopathy   Post-op Diagnosis: Same   Surgeon: Baltazar Apo   Assessment & Plan:  Pulmonary nodules Right upper lobe and left upper lobe nodules biopsied.  Left upper lobe was a bit equivocal due to inadequate tissue but was suggestive of adenocarcinoma, right upper lobe consistent w squamous cell.  Question whether these are the same process versus 2 separate processes.  He does also have right hilar adenopathy.  Patient and his wife question whether he is a surgical candidate but given the multiple right-sided nodules (only 1 of these was sampled), bilateral disease and hilar adenopathy I doubt he is a surgical candidate.  He is going to go see thoracic surgery at [facility] next week.  We will try to get all of the data collected for him to take with him so they have adequate access.   We reviewed your imaging and pathology results from bronchoscopy today. Follow with oncology and thoracic surgery at [facility] as planned. We will provide you with copies of your pathology report.  Is possible that they will want to look at the actual slides from [facility] pathology going forward. Would recommend that you take a disc from medical records containing your imaging to [facility] with you.  We will try to help you arrange for this. Follow Dr. Lamonte Sakai as needed     Baltazar Apo, MD, PhD 07/25/2021, 9:48 AM   Past/Anticipated interventions by medical oncology, if any: None at this time.   Signs/Symptoms Weight changes, if any: {:18581} Respiratory complaints, if any: {:18581} Hemoptysis, if any: {:18581} Pain issues, if any:  {:18581}  SAFETY ISSUES: Prior radiation? {:18581} Pacemaker/ICD? {:18581}  Possible current pregnancy?{:18581} Is the patient on methotrexate? {:18581}  Current Complaints / other details:  ***

## 2022-03-07 ENCOUNTER — Encounter: Payer: Self-pay | Admitting: Radiation Oncology

## 2022-03-07 ENCOUNTER — Ambulatory Visit
Admission: RE | Admit: 2022-03-07 | Discharge: 2022-03-07 | Disposition: A | Discharge status: Home / Self Care | Payer: Medicare Other | Source: Ambulatory Visit | Attending: Radiation Oncology | Admitting: Radiation Oncology

## 2022-03-07 ENCOUNTER — Encounter (HOSPITAL_COMMUNITY): Payer: Medicare Other

## 2022-03-07 ENCOUNTER — Other Ambulatory Visit: Payer: Self-pay

## 2022-03-07 VITALS — BP 124/65 | HR 78 | Temp 97.9°F | Resp 20 | Ht 72.0 in | Wt 185.0 lb

## 2022-03-07 DIAGNOSIS — K219 Gastro-esophageal reflux disease without esophagitis: Secondary | ICD-10-CM | POA: Insufficient documentation

## 2022-03-07 DIAGNOSIS — I110 Hypertensive heart disease with heart failure: Secondary | ICD-10-CM | POA: Insufficient documentation

## 2022-03-07 DIAGNOSIS — C3411 Malignant neoplasm of upper lobe, right bronchus or lung: Secondary | ICD-10-CM | POA: Diagnosis not present

## 2022-03-07 DIAGNOSIS — E785 Hyperlipidemia, unspecified: Secondary | ICD-10-CM | POA: Insufficient documentation

## 2022-03-07 DIAGNOSIS — I2510 Atherosclerotic heart disease of native coronary artery without angina pectoris: Secondary | ICD-10-CM | POA: Diagnosis not present

## 2022-03-07 DIAGNOSIS — I509 Heart failure, unspecified: Secondary | ICD-10-CM | POA: Diagnosis not present

## 2022-03-07 DIAGNOSIS — Z79899 Other long term (current) drug therapy: Secondary | ICD-10-CM | POA: Diagnosis not present

## 2022-03-07 DIAGNOSIS — M8580 Other specified disorders of bone density and structure, unspecified site: Secondary | ICD-10-CM | POA: Insufficient documentation

## 2022-03-07 DIAGNOSIS — Z87891 Personal history of nicotine dependence: Secondary | ICD-10-CM | POA: Diagnosis not present

## 2022-03-07 DIAGNOSIS — C3491 Malignant neoplasm of unspecified part of right bronchus or lung: Secondary | ICD-10-CM

## 2022-03-11 ENCOUNTER — Ambulatory Visit
Admission: RE | Admit: 2022-03-11 | Discharge: 2022-03-11 | Disposition: A | Discharge status: Home / Self Care | Payer: Medicare Other | Source: Ambulatory Visit | Attending: Radiation Oncology | Admitting: Radiation Oncology

## 2022-03-11 ENCOUNTER — Other Ambulatory Visit: Payer: Self-pay

## 2022-03-11 DIAGNOSIS — Z510 Encounter for antineoplastic radiation therapy: Secondary | ICD-10-CM | POA: Diagnosis not present

## 2022-03-11 DIAGNOSIS — I509 Heart failure, unspecified: Secondary | ICD-10-CM | POA: Diagnosis not present

## 2022-03-11 DIAGNOSIS — K5900 Constipation, unspecified: Secondary | ICD-10-CM | POA: Diagnosis not present

## 2022-03-11 DIAGNOSIS — I110 Hypertensive heart disease with heart failure: Secondary | ICD-10-CM | POA: Diagnosis not present

## 2022-03-11 DIAGNOSIS — Z803 Family history of malignant neoplasm of breast: Secondary | ICD-10-CM | POA: Diagnosis not present

## 2022-03-11 DIAGNOSIS — R3915 Urgency of urination: Secondary | ICD-10-CM | POA: Insufficient documentation

## 2022-03-11 DIAGNOSIS — C3412 Malignant neoplasm of upper lobe, left bronchus or lung: Secondary | ICD-10-CM | POA: Insufficient documentation

## 2022-03-11 DIAGNOSIS — I2510 Atherosclerotic heart disease of native coronary artery without angina pectoris: Secondary | ICD-10-CM | POA: Insufficient documentation

## 2022-03-11 DIAGNOSIS — G629 Polyneuropathy, unspecified: Secondary | ICD-10-CM | POA: Diagnosis not present

## 2022-03-11 DIAGNOSIS — Z5111 Encounter for antineoplastic chemotherapy: Secondary | ICD-10-CM | POA: Diagnosis not present

## 2022-03-11 DIAGNOSIS — Z87891 Personal history of nicotine dependence: Secondary | ICD-10-CM | POA: Diagnosis not present

## 2022-03-11 DIAGNOSIS — R82998 Other abnormal findings in urine: Secondary | ICD-10-CM | POA: Diagnosis not present

## 2022-03-11 DIAGNOSIS — C3411 Malignant neoplasm of upper lobe, right bronchus or lung: Secondary | ICD-10-CM | POA: Insufficient documentation

## 2022-03-11 DIAGNOSIS — C3491 Malignant neoplasm of unspecified part of right bronchus or lung: Secondary | ICD-10-CM

## 2022-03-11 DIAGNOSIS — Z806 Family history of leukemia: Secondary | ICD-10-CM | POA: Diagnosis not present

## 2022-03-12 ENCOUNTER — Encounter: Payer: Self-pay | Admitting: Medical Oncology

## 2022-03-12 ENCOUNTER — Inpatient Hospital Stay (HOSPITAL_BASED_OUTPATIENT_CLINIC_OR_DEPARTMENT_OTHER): Payer: Medicare Other | Admitting: Internal Medicine

## 2022-03-12 ENCOUNTER — Inpatient Hospital Stay: Payer: Medicare Other

## 2022-03-12 ENCOUNTER — Encounter (HOSPITAL_COMMUNITY): Payer: Medicare Other

## 2022-03-12 VITALS — BP 124/66 | HR 70 | Temp 98.1°F | Resp 17 | Wt 184.5 lb

## 2022-03-12 DIAGNOSIS — I509 Heart failure, unspecified: Secondary | ICD-10-CM | POA: Insufficient documentation

## 2022-03-12 DIAGNOSIS — I2510 Atherosclerotic heart disease of native coronary artery without angina pectoris: Secondary | ICD-10-CM | POA: Insufficient documentation

## 2022-03-12 DIAGNOSIS — Z803 Family history of malignant neoplasm of breast: Secondary | ICD-10-CM | POA: Insufficient documentation

## 2022-03-12 DIAGNOSIS — Z5111 Encounter for antineoplastic chemotherapy: Secondary | ICD-10-CM | POA: Insufficient documentation

## 2022-03-12 DIAGNOSIS — I110 Hypertensive heart disease with heart failure: Secondary | ICD-10-CM | POA: Insufficient documentation

## 2022-03-12 DIAGNOSIS — C3412 Malignant neoplasm of upper lobe, left bronchus or lung: Secondary | ICD-10-CM | POA: Insufficient documentation

## 2022-03-12 DIAGNOSIS — G629 Polyneuropathy, unspecified: Secondary | ICD-10-CM | POA: Insufficient documentation

## 2022-03-12 DIAGNOSIS — Z87891 Personal history of nicotine dependence: Secondary | ICD-10-CM

## 2022-03-12 DIAGNOSIS — Z510 Encounter for antineoplastic radiation therapy: Secondary | ICD-10-CM | POA: Diagnosis not present

## 2022-03-12 DIAGNOSIS — Z800 Family history of malignant neoplasm of digestive organs: Secondary | ICD-10-CM

## 2022-03-12 DIAGNOSIS — Z806 Family history of leukemia: Secondary | ICD-10-CM

## 2022-03-12 DIAGNOSIS — C3490 Malignant neoplasm of unspecified part of unspecified bronchus or lung: Secondary | ICD-10-CM

## 2022-03-12 DIAGNOSIS — R3915 Urgency of urination: Secondary | ICD-10-CM | POA: Insufficient documentation

## 2022-03-12 DIAGNOSIS — K5900 Constipation, unspecified: Secondary | ICD-10-CM | POA: Insufficient documentation

## 2022-03-12 DIAGNOSIS — C3411 Malignant neoplasm of upper lobe, right bronchus or lung: Secondary | ICD-10-CM

## 2022-03-12 DIAGNOSIS — R82998 Other abnormal findings in urine: Secondary | ICD-10-CM | POA: Insufficient documentation

## 2022-03-12 DIAGNOSIS — Z8042 Family history of malignant neoplasm of prostate: Secondary | ICD-10-CM

## 2022-03-12 DIAGNOSIS — C3491 Malignant neoplasm of unspecified part of right bronchus or lung: Secondary | ICD-10-CM

## 2022-03-12 DIAGNOSIS — R918 Other nonspecific abnormal finding of lung field: Secondary | ICD-10-CM

## 2022-03-12 LAB — CBC WITH DIFFERENTIAL (CANCER CENTER ONLY)
Abs Immature Granulocytes: 0.01 10*3/uL (ref 0.00–0.07)
Basophils Absolute: 0 10*3/uL (ref 0.0–0.1)
Basophils Relative: 0 %
Eosinophils Absolute: 0.1 10*3/uL (ref 0.0–0.5)
Eosinophils Relative: 2 %
HCT: 40.5 % (ref 39.0–52.0)
Hemoglobin: 13.7 g/dL (ref 13.0–17.0)
Immature Granulocytes: 0 %
Lymphocytes Relative: 25 %
Lymphs Abs: 1.8 10*3/uL (ref 0.7–4.0)
MCH: 31.2 pg (ref 26.0–34.0)
MCHC: 33.8 g/dL (ref 30.0–36.0)
MCV: 92.3 fL (ref 80.0–100.0)
Monocytes Absolute: 0.7 10*3/uL (ref 0.1–1.0)
Monocytes Relative: 10 %
Neutro Abs: 4.4 10*3/uL (ref 1.7–7.7)
Neutrophils Relative %: 63 %
Platelet Count: 215 10*3/uL (ref 150–400)
RBC: 4.39 MIL/uL (ref 4.22–5.81)
RDW: 13.3 % (ref 11.5–15.5)
WBC Count: 7 10*3/uL (ref 4.0–10.5)
nRBC: 0 % (ref 0.0–0.2)

## 2022-03-12 LAB — CMP (CANCER CENTER ONLY)
ALT: 12 U/L (ref 0–44)
AST: 12 U/L — ABNORMAL LOW (ref 15–41)
Albumin: 3.9 g/dL (ref 3.5–5.0)
Alkaline Phosphatase: 65 U/L (ref 38–126)
Anion gap: 6 (ref 5–15)
BUN: 23 mg/dL (ref 8–23)
CO2: 29 mmol/L (ref 22–32)
Calcium: 9.5 mg/dL (ref 8.9–10.3)
Chloride: 103 mmol/L (ref 98–111)
Creatinine: 1.05 mg/dL (ref 0.61–1.24)
GFR, Estimated: >60 mL/min (ref >60)
Glucose, Bld: 98 mg/dL (ref 70–99)
Potassium: 4.7 mmol/L (ref 3.5–5.1)
Sodium: 138 mmol/L (ref 135–145)
Total Bilirubin: 0.3 mg/dL (ref 0.3–1.2)
Total Protein: 7.2 g/dL (ref 6.5–8.1)

## 2022-03-12 MED ORDER — FOLIC ACID 1 MG PO TABS: 1.0000 mg | ORAL_TABLET | Freq: Every day | ORAL | 1 refills | Status: DC

## 2022-03-12 MED ORDER — DEXAMETHASONE 4 MG PO TABS: ORAL_TABLET | ORAL | 0 refills | Status: DC

## 2022-03-12 MED ORDER — ONDANSETRON HCL 8 MG PO TABS: 8.0000 mg | ORAL_TABLET | Freq: Three times a day (TID) | ORAL | 0 refills | Status: DC | PRN

## 2022-03-12 NOTE — Progress Notes (Signed)
START OFF PATHWAY REGIMEN - Non-Small Cell Lung   [id number]:Carboplatin AUC=5 + Pemetrexed 500 mg/m2 q21 Days:   A cycle is every 21 days:     Pemetrexed      Carboplatin   **Always confirm dose/schedule in your pharmacy ordering system**  Patient Characteristics: Preoperative or Nonsurgical Candidate (Clinical Staging), Stage III - Nonsurgical Candidate (Nonsquamous and Squamous), PS = 0, 1 Therapeutic Status: Preoperative or Nonsurgical Candidate (Clinical Staging) AJCC T Category: cT2a AJCC N Category: cN2 AJCC M Category: cM0 AJCC 8 Stage Grouping: IIIA ECOG Performance Status: 1 Intent of Therapy: Curative Intent, Discussed with Patient

## 2022-03-12 NOTE — Progress Notes (Signed)
[place] [phone]   [phone]  CONSULT NOTE  REFERRING PHYSICIAN: Dr. Ahmed Prima  REASON FOR CONSULTATION:  77 years old white male with recurrent non-small cell lung cancer  HPI Patrick Harris is a 77 y.o. male past medical history significant for hypertension congestive heart failure, coronary artery disease, COPD, dyslipidemia, hypothyroidism, osteoporosis as well as history of pancreatitis and GERD.  The patient also has a long history of smoking but quit few years ago.  He gives a history of seeing his primary care physician Dr. Quay Burow for routine evaluation and because of his smoking history he had CT screening of the chest which was performed on Jun 05, 2021 and that showed 2.8 cm noncalcified nodule with spiculated margin located in the anterior right upper lobe.  The scan also showed 2.5 x 1.1 cm nodule in the medial right upper lobe with spiculated margins.  There was also a 1.2 x 0.7 cm nodular density in the posterior right mid lung field most likely in the superior segment of the right lower lobe.  There was also a 1.8 x 1.4 x 1.7 cm nodule in the medial left upper lobe with a spiculated margin.  The scan also showed slightly enlarged lymph nodes in the mediastinum including 1.5 x 0.9 cm node in the precarinal region.  He was referred to Dr. Lamonte Sakai and on 07/02/2021 he underwent flexible video fiberoptic bronchoscopy with robotic assistance and biopsies of the bilateral pulmonary nodules.  The final pathology of the right upper lobe nodule ([id number]) showed malignant cells consistent with non-small cell carcinoma.The malignant cells are positive with p63 and cytokeratin 56 and negative with TTF-1.  The immunophenotype is consistent with squamous cell carcinoma.  The final pathology from the left upper lobe fine-needle aspiration ([id number]) showed malignant cells consistent with non-small cell carcinoma.  The features favor  adenocarcinoma but there was limited tumor cellularity for immunohistochemical stains.  The patient had MRI of the brain on 07/23/2021 and it was unremarkable for any intracranial metastasis.  He also had a PET scan on 07/23/2021 and that showed 2 hypermetabolic nodules of the right upper lobe and hypermetabolic nodule of the left lower lobe compatible with the history of primary lung malignancy.  There was hypermetabolic right hilar lymph node compatible with nodal metastatic disease.  There was subpleural nodule of the superior portion of the right lower lobe with mild FDG uptake below the mediastinal pool and indolent primary lung malignancy is not excluded.  There was no evidence of metastatic disease in the abdomen or pelvis.  There was also focal hypermetabolic uptake seen in the prostate. The patient was seen by Dr. Ahmed Prima at [facility] and he recommended for him neoadjuvant treatment with carboplatin, paclitaxel and nivolumab for 3 cycles which were performed at the [facility] in [facility] close to his home.  Repeat CT scan of the chest without contrast on November 06, 2021 showed the spiculated nodules in the upper lobes bilaterally with diminished size consistent with response to therapy.  There was a stable appearance of the small mediastinal lymph nodes and no new or progressive findings in the chest.  On November 13, 2021 and the patient underwent right upper lobectomy with mediastinal and regional lymph node dissection under the care of Dr. Carlye Grippe at [facility].  The final pathology showed residual viable tumor measuring approximately 0.9 cm in greatest dimension with extensive lymphangitic spread and metastatic poorly differentiated carcinoma involving  5 of 6 lymph nodes that were dissected from the first lesion.  There was also moderately to poorly differentiated squamous cell carcinoma with major treatment response and the second lesion with residual viable tumor  measuring 0.2 cm in greatest dimension.  This were to synchronous primary carcinoma including adenocarcinoma squamous cell carcinoma identified from the right upper lobectomy specimen. The poorly differentiated adenocarcinoma is positive for pancytokeratin and TTF-1 (focally, on lymph node specimen G) while they are negative for p40, CK7, CK20 (rare staining), and napsinA. PAS mucin stain is positive. The adenocarcinoma demonstrates extensive lymphagitic spreading pattern with multiple lymph nodes metastasis (14/24), suggesting a poor prognosis.  The patient had molecular studies by [facility] that showed no actionable mutations.  PD-L1 expression was 15% in the adenocarcinoma specimen and 2% in the squamous cell carcinoma specimen. The patient was followed by observation and he had repeat PET scan on January 18, 2022 and that showed an intensely hypermetabolic precarinal node highly concerning for metastasis.  Other bilateral hilar nodes show activity mildly above the blood pool levels and this could be nonspecific and favored to be reactive in nature.  There was also areas of scattered right pleural thickening with associated calcification and mild FDG uptake favoring postsurgical origin.  He had repeat bronchoscopy on 02/26/2022 and it showed metastatic non-small cell lung cancer from 4R lymph node fine-needle aspiration [id number]) from [facility]. The patient was seen by Dr. Carlis Abbott who recommended for him a course of concurrent chemoradiation and he referred him to me today for discussion of this treatment option close to home. When seen today the patient is feeling fine except for the shortness of breath and he has peripheral neuropathy from the previous chemotherapy.  He also has mild cough with no chest pain or hemoptysis.  He has intermittent nausea and currently on Zofran. He has no nausea, vomiting, diarrhea or constipation.  He has no headache or visual changes. Family history significant for  several family members with malignancy including 3 sister with breast cancer and brother with prostate cancer.  He also has another sister with leukemia.  Father had heart disease and mother had diabetes and stroke. The patient is married and he had 1 daughter who is deceased from breast cancer.  He was accompanied today by his wife Patrick Harris.  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  He has a history of smoking for around 40 years and quit few years ago.  He has no history of drug abuse. HPI  Past Medical History:  Diagnosis Date   Arthritis    CAD (coronary artery disease)    CABG with LIMA to LAD and RIMA to RCA August 2000 - Dr. Roxan Hockey   Cancer [facility])    basal cell   CHF (congestive heart failure) (HCC)    COPD (chronic obstructive pulmonary disease) (HCC)    Elevated glycated hemoglobin    Essential hypertension    GERD (gastroesophageal reflux disease)    Heart murmur    Hyperlipidemia    Hypopituitarism ([place])    Lung cancer (HCC)    Osteopenia    Dr Wilson Singer   Pancreatitis    Peyronie disease    Testosterone deficiency    Dr Wilson Singer    Past Surgical History:  Procedure Laterality Date   Bone spur  2006   5th toe bilaterally   BRONCHIAL BIOPSY  07/02/2021   Procedure: BRONCHIAL BIOPSIES;  Surgeon: Collene Gobble, MD;  Location: [facility] ENDOSCOPY;  Service: Pulmonary;;   BRONCHIAL BRUSHINGS  07/02/2021   Procedure: BRONCHIAL BRUSHINGS;  Surgeon: Collene Gobble, MD;  Location: [facility] ENDOSCOPY;  Service: Pulmonary;;   BRONCHIAL NEEDLE ASPIRATION BIOPSY  07/02/2021   Procedure: BRONCHIAL NEEDLE ASPIRATION BIOPSIES;  Surgeon: Collene Gobble, MD;  Location: [facility] ENDOSCOPY;  Service: Pulmonary;;   COLONOSCOPY  2007   negative; Dr Sharlett Iles   CORONARY ARTERY BYPASS GRAFT  2001   2 vessels   FIDUCIAL MARKER PLACEMENT  07/02/2021   Procedure: FIDUCIAL MARKER PLACEMENT;  Surgeon: Collene Gobble, MD;  Location: [facility] ENDOSCOPY;   Service: Pulmonary;;   fracture heel  1967   Bil/ due to fall off ladder   Fractured calcaneus  1969   bilaterally w/ ankle fusion   [place]   right inguinal hernia   KNEE SURGERY Left    LOBECTOMY Right 11/13/2021   MENISECTOMY  2007   R medial, Dr. Lorin Mercy   PORTA CATH INSERTION     SHOULDER SURGERY  1994   left   UPPER GASTROINTESTINAL ENDOSCOPY  2007   GERD, Dr Sharlett Iles   VASECTOMY     VIDEO BRONCHOSCOPY WITH RADIAL ENDOBRONCHIAL ULTRASOUND  07/02/2021   Procedure: [place];  Surgeon: Collene Gobble, MD;  Location: [facility] ENDOSCOPY;  Service: Pulmonary;;    Family History  Problem Relation Age of Onset   Heart attack Father 5   Diabetes Mother    Stroke Mother 45   Hypertension Mother    Leukemia Sister    Colon cancer Paternal Uncle    Aortic aneurysm Brother        abdominal   Heart disease Brother    Atrial fibrillation Brother    Cancer Maternal Uncle         X3:prostate , renal, bone    Parkinson's disease Sister    Stroke Sister    Stroke Sister    Stroke Sister    Atrial fibrillation Sister    Esophageal cancer Neg Hx    Rectal cancer Neg Hx    Stomach cancer Neg Hx     Social History Social History   Tobacco Use   Smoking status: Former    Packs/day: 0.25    Types: Cigarettes    Quit date: 05/2021    Years since quitting: 0.8   Smokeless tobacco: Never  Vaping Use   Vaping Use: Never used  Substance Use Topics   Alcohol use: No   Drug use: No    Allergies  Allergen Reactions   Latex Dermatitis    Contact  dermatitis   Bromocriptine Nausea Only    Dry heaves   Zestril [Lisinopril] Other (See Comments)    Leg pain, inc HR, dizziness, headache   Diovan [Valsartan] Nausea Only   Hydrochlorothiazide Other (See Comments)    Possibly caused pancreatitis 2018   Norvasc [Amlodipine] Other (See Comments)    Edema    Zocor [Simvastatin]     Joint pain    Current Outpatient  Medications  Medication Sig Dispense Refill   acetaminophen (TYLENOL) 500 MG tablet Take 500 mg by mouth every 6 (six) hours as needed (for pain.).     aspirin EC 81 MG tablet Take 1 tablet (81 mg total) by mouth at bedtime. Okay to restart this medication on 07/03/2021 30 tablet 11   Cholecalciferol (VITAMIN D3) 2000 UNITS TABS Take 2,000 Units by mouth in the morning and at bedtime.  Coenzyme Q10 (COQ-10) 100 MG CAPS Take 100 mg by mouth in the morning.     Cyanocobalamin (B-12 PO) Take 1 tablet by mouth daily.     hydrALAZINE (APRESOLINE) 25 MG tablet Take 1 tablet (25 mg total) by mouth 3 (three) times daily. 270 tablet 3   irbesartan (AVAPRO) 300 MG tablet Take 1 tablet (300 mg total) by mouth daily. 90 tablet 3   nitroGLYCERIN (NITROSTAT) 0.4 MG SL tablet Place 1 tablet (0.4 mg total) under the tongue every 5 (five) minutes x 3 doses as needed for chest pain (if no relief after 2nd dose, proceed to ED or call 911). 25 tablet 3   Omega-3 Fatty Acids (OMEGA-3 FISH OIL PO) Take 1,000 mg by mouth in the morning.     omeprazole (PRILOSEC) 40 MG capsule Take 1 capsule (40 mg total) by mouth in the morning.     ondansetron (ZOFRAN) 8 MG tablet Take 8 mg by mouth every 8 (eight) hours as needed for vomiting or nausea.     Polyethyl Glycol-Propyl Glycol (LUBRICANT EYE DROPS) 0.4-0.3 % SOLN Place 1-2 drops into both eyes 3 (three) times daily as needed (dry/irritated eyes.).     rosuvastatin (CRESTOR) 20 MG tablet TAKE HALF A TABLET (10 MG) BY MOUTH ONCE DAILY. 45 tablet 3   tadalafil (CIALIS) 5 MG tablet Take 5 mg by mouth daily as needed for erectile dysfunction.     tamsulosin (FLOMAX) 0.4 MG CAPS capsule Take 0.4 mg by mouth in the morning.     Testosterone 20.25 MG/ACT (1.62%) GEL Apply 2 Pump topically in the morning.     vitamin C (ASCORBIC ACID) 500 MG tablet Take 500 mg by mouth in the morning.     No current facility-administered medications for this visit.    Review of  Systems  Constitutional: positive for fatigue Eyes: negative Ears, nose, mouth, throat, and face: negative Respiratory: positive for cough and dyspnea on exertion Cardiovascular: negative Gastrointestinal: positive for nausea Genitourinary:negative Integument/breast: negative Hematologic/lymphatic: negative Musculoskeletal:negative Neurological: positive for paresthesia Behavioral/Psych: negative Endocrine: negative Allergic/Immunologic: negative  Physical Exam  [id number], healthy, no distress, well nourished, and well developed SKIN: skin color, texture, turgor are normal, no rashes or significant lesions HEAD: Normocephalic, No masses, lesions, tenderness or abnormalities EYES: normal, PERRLA, Conjunctiva are pink and non-injected EARS: External ears normal, Canals clear OROPHARYNX:no exudate, no erythema, and lips, buccal mucosa, and tongue normal  NECK: supple, no adenopathy, no JVD LYMPH:  no palpable lymphadenopathy, no hepatosplenomegaly LUNGS: clear to auscultation , and palpation HEART: regular rate & rhythm, no murmurs, and no gallops ABDOMEN:abdomen soft, non-tender, normal bowel sounds, and no masses or organomegaly BACK: Back symmetric, no curvature., No CVA tenderness EXTREMITIES:no joint deformities, effusion, or inflammation, no edema  NEURO: alert & oriented x 3 with fluent speech, no focal motor/sensory deficits  PERFORMANCE STATUS: ECOG 1  LABORATORY DATA: Lab Results  Component Value Date   WBC 7.1 07/02/2021   HGB 17.3 (H) 07/02/2021   HCT 51.3 07/02/2021   MCV 95.0 07/02/2021   PLT 162 07/02/2021      Chemistry      Component Value Date/Time   NA 136 07/02/2021 0719   K 4.3 07/02/2021 0719   CL 103 07/02/2021 0719   CO2 22 07/02/2021 0719   BUN 17 07/02/2021 0719   CREATININE 0.98 07/02/2021 0719   CREATININE 1.07 08/25/2019 0847      Component Value Date/Time   CALCIUM 9.0 07/02/2021 0719   ALKPHOS  59 05/25/2021 0928   AST 16  05/25/2021 0928   ALT 15 05/25/2021 0928   BILITOT 0.8 05/25/2021 0928       RADIOGRAPHIC STUDIES: VAS [place] ABI WITH/WO TBI  Result Date: 03/05/2022  LOWER EXTREMITY DOPPLER STUDY Patient Name:  Patrick Harris  Date of Exam:   03/05/2022 Medical Rec #: [place]:[id number]          Accession #:    [id number] Date of Birth: [birth year]           Patient Gender: M Patient Age:   58 years Exam Location:  [facility] Procedure:      VAS [place] ABI WITH/WO TBI Referring Phys: Jamelle Haring --------------------------------------------------------------------------------  Indications: Claudication, and peripheral artery disease. High Risk Factors: Hypertension, hyperlipidemia, coronary artery disease.  Performing Technologist: Ralene Cork RVT  Examination Guidelines: A complete evaluation includes at minimum, Doppler waveform signals and systolic blood pressure reading at the level of bilateral brachial, anterior tibial, and posterior tibial arteries, when vessel segments are accessible. Bilateral testing is considered an integral part of a complete examination. Photoelectric Plethysmograph (PPG) waveforms and toe systolic pressure readings are included as required and additional duplex testing as needed. Limited examinations for reoccurring indications may be performed as noted.  ABI Findings: +---------+------------------+-----+----------+--------+ Right    Rt Pressure (mmHg)IndexWaveform  Comment  +---------+------------------+-----+----------+--------+ Brachial 142                                       +---------+------------------+-----+----------+--------+ PTA      113               0.80 biphasic           +---------+------------------+-----+----------+--------+ DP       108               0.76 monophasic         +---------+------------------+-----+----------+--------+ Great Toe93                0.65                    +---------+------------------+-----+----------+--------+  +---------+------------------+-----+--------+-------+ Left     Lt Pressure (mmHg)IndexWaveformComment +---------+------------------+-----+--------+-------+ Brachial 140                                    +---------+------------------+-----+--------+-------+ PTA      131               0.92 biphasic        +---------+------------------+-----+--------+-------+ DP       125               0.88 biphasic        +---------+------------------+-----+--------+-------+ Great Toe94                0.66                 +---------+------------------+-----+--------+-------+ +-------+-----------+-----------+------------+------------+ ABI/TBIToday's ABIToday's TBIPrevious ABIPrevious TBI +-------+-----------+-----------+------------+------------+ Right  0.8        0.65       0.68        0.53         +-------+-----------+-----------+------------+------------+ Left   0.92       0.66       0.82        0.62         +-------+-----------+-----------+------------+------------+  Previous ABI on 10/03/20.  Summary: Right: Resting right ankle-brachial index indicates mild right lower extremity arterial disease. The right toe-brachial index is abnormal. Left: Resting left ankle-brachial index indicates mild left lower extremity arterial disease. The left toe-brachial index is abnormal. *See table(s) above for measurements and observations.  Electronically signed by Monica Martinez MD on 03/05/2022 at 9:29:33 AM.    Final    DG Chest 2 View  Result Date: 02/20/2022 CLINICAL DATA:  Shortness of breath, hypoxia, history of lobectomy EXAM: CHEST - 2 VIEW COMPARISON:  12/04/2021 FINDINGS: Normal heart size and vascularity. Remote median sternotomy noted. Postop changes in the right hemithorax. Similar chronic pleuroparenchymal scarring in the right hemithorax with volume loss. Stable position of the right IJ power port catheter, tip mid SVC level. No new superimposed pneumonia, collapse or  consolidation. Previous upper lobe lung nodules by PET-CT are not visualized by plain radiography. Degenerative changes throughout the spine. No acute osseous finding. Trachea midline. IMPRESSION: Stable chronic and postoperative findings as above. No interval change or superimposed acute process by plain radiography. Electronically Signed   By: Jerilynn Mages.  Shick M.D.   On: 02/20/2022 12:00    ASSESSMENT: This is a very pleasant 77 years old white male with recurrent non-small cell lung cancer, adenocarcinoma that was initially diagnosed as multifocal synchronous disease including stage IIIA (T1c, M2, M0) adenocarcinoma in the right upper lobe as well as stage IA (T1c, N0, M0) squamous cell carcinoma involving the right upper lobe and stage IA (T1b, N0, M0) adenocarcinoma involving the left upper lobe diagnosed in June 2023. The patient is status post neoadjuvant systemic chemotherapy with carboplatin, paclitaxel and nivolumab for 3 cycles in [facility] followed by right upper lobectomy with lymph node dissection at [facility] under the care of Dr. Williemae Natter with the final pathology showing residual disease with synchronous adenocarcinoma as well as squamous cell carcinoma in the right upper lobe and multiple mediastinal lymph node involvement performed on November 13, 2021. Unfortunately patient was found to have evidence for disease recurrence with involvement of 4R lymphadenopathy consistent with metastatic adenocarcinoma on February 26, 2022 biopsy. Molecular studies showed no actionable mutations and PD-L1 expression was 15% in the adenocarcinoma in 2% and the squamous cell carcinoma.  PLAN: I had a lengthy discussion with the patient and his wife today about his current disease stage, prognosis and treatment options. I personally and independently reviewed the imaging studies from [facility] as well as [employer]. I recommended for the patient to complete the staging workup by ordering MRI of  the brain to rule out brain metastasis. I also discussed with the patient his treatment options and I recommended for him a course of concurrent chemoradiation for 6-7 weeks. The patient has evidence for peripheral neuropathy from the previous chemotherapy and I recommended for him concurrent chemotherapy with carboplatin for AUC of 5 and Alimta 500 Mg/M2 every 3 weeks for 2 cycles during the course of radiotherapy. I discussed with the patient the adverse effect of this treatment including but not limited to alopecia, myelosuppression, nausea and vomiting, peripheral neuropathy, liver or renal dysfunction. After completion of the course of concurrent chemoradiation, we may consider The patient for consolidation treatment with immunotherapy if he has no evidence for disease progression after the induction phase. I will arrange for the patient to receive vitamin B12 injection before starting the treatment.  I will call his pharmacy with prescription for Zofran for nausea, folic acid 1 mg p.o. daily in addition  to Decadron 4 mg p.o. twice daily the day before, day of and day after chemotherapy. The patient will have a chemotherapy education class before the first dose of his treatment. He was seen by Dr. Sondra Come and expected to start the radiotherapy next week.  He will receive his first cycle of the chemotherapy next week. He will come back for follow-up visit in 2 weeks for evaluation and management of any adverse effect of his treatment. The patient was advised to call immediately if he has any concerning symptoms in the interval.  The patient voices understanding of current disease status and treatment options and is in agreement with the current care plan.  All questions were answered. The patient knows to call the clinic with any problems, questions or concerns. We can certainly see the patient much sooner if necessary.  Thank you so much for allowing me to participate in the care of Patrick Harris. I will continue to follow up the patient with you and assist in his care.  The total time spent in the appointment was 90 minutes.  Disclaimer: This note was dictated with voice recognition software. Similar sounding words can inadvertently be transcribed and may not be corrected upon review.   Eilleen Kempf March 12, 2022, 1:59 PM

## 2022-03-13 ENCOUNTER — Telehealth: Payer: Self-pay | Admitting: Internal Medicine

## 2022-03-13 ENCOUNTER — Other Ambulatory Visit: Payer: Self-pay

## 2022-03-13 NOTE — Telephone Encounter (Signed)
Scheduled per 03/05 los, patient has been called and notified of upcoming appointments.

## 2022-03-13 NOTE — Progress Notes (Signed)
Pharmacist Chemotherapy Monitoring - Initial Assessment    Anticipated start date: 03/20/22   The following has been reviewed per standard work regarding the patient's treatment regimen: The patient's diagnosis, treatment plan and drug doses, and organ/hematologic function Lab orders and baseline tests specific to treatment regimen  The treatment plan start date, drug sequencing, and pre-medications Prior authorization status  Patient's documented medication list, including drug-drug interaction screen and prescriptions for anti-emetics and supportive care specific to the treatment regimen The drug concentrations, fluid compatibility, administration routes, and timing of the medications to be used The patient's access for treatment and lifetime cumulative dose history, if applicable  The patient's medication allergies and previous infusion related reactions, if applicable   Changes made to treatment plan:  N/A  Follow up needed:  Pending authorization for treatment    Kennith Center, Pharm.D., CPP 03/13/2022'@2'$ :39 PM

## 2022-03-14 ENCOUNTER — Encounter (HOSPITAL_COMMUNITY)
Admission: RE | Admit: 2022-03-14 | Discharge: 2022-03-14 | Disposition: A | Discharge status: Home / Self Care | Payer: Medicare Other | Source: Ambulatory Visit | Attending: Thoracic Surgery (Cardiothoracic Vascular Surgery) | Admitting: Thoracic Surgery (Cardiothoracic Vascular Surgery)

## 2022-03-14 DIAGNOSIS — R82998 Other abnormal findings in urine: Secondary | ICD-10-CM | POA: Diagnosis not present

## 2022-03-14 DIAGNOSIS — Z5111 Encounter for antineoplastic chemotherapy: Secondary | ICD-10-CM | POA: Diagnosis not present

## 2022-03-14 DIAGNOSIS — C3411 Malignant neoplasm of upper lobe, right bronchus or lung: Secondary | ICD-10-CM | POA: Insufficient documentation

## 2022-03-14 DIAGNOSIS — K5900 Constipation, unspecified: Secondary | ICD-10-CM | POA: Diagnosis not present

## 2022-03-14 DIAGNOSIS — Z803 Family history of malignant neoplasm of breast: Secondary | ICD-10-CM | POA: Diagnosis not present

## 2022-03-14 DIAGNOSIS — Z87891 Personal history of nicotine dependence: Secondary | ICD-10-CM | POA: Diagnosis not present

## 2022-03-14 DIAGNOSIS — Z510 Encounter for antineoplastic radiation therapy: Secondary | ICD-10-CM | POA: Diagnosis not present

## 2022-03-14 DIAGNOSIS — G629 Polyneuropathy, unspecified: Secondary | ICD-10-CM | POA: Diagnosis not present

## 2022-03-14 DIAGNOSIS — I509 Heart failure, unspecified: Secondary | ICD-10-CM | POA: Diagnosis not present

## 2022-03-14 DIAGNOSIS — Z806 Family history of leukemia: Secondary | ICD-10-CM | POA: Diagnosis not present

## 2022-03-14 DIAGNOSIS — C3412 Malignant neoplasm of upper lobe, left bronchus or lung: Secondary | ICD-10-CM | POA: Insufficient documentation

## 2022-03-14 DIAGNOSIS — I110 Hypertensive heart disease with heart failure: Secondary | ICD-10-CM | POA: Diagnosis not present

## 2022-03-14 DIAGNOSIS — I2510 Atherosclerotic heart disease of native coronary artery without angina pectoris: Secondary | ICD-10-CM | POA: Diagnosis not present

## 2022-03-14 DIAGNOSIS — R3915 Urgency of urination: Secondary | ICD-10-CM | POA: Diagnosis not present

## 2022-03-14 NOTE — Progress Notes (Signed)
Daily Session Note  Patient Details  Name: ABUBAKR JAMERSON MRN: [place]:[id number] Date of Birth: 08/10/1945 Referring Provider:   [place] from 12/27/2021 in [place]  Referring Provider Dr. Williemae Natter       Encounter Date: 03/14/2022  Check In:  Session Check In - 03/14/22 1330       Check-In   Supervising physician immediately available to respond to emergencies [facility] MD immediately available    Physician(s) Dr Harl Bowie    Location [facility]    Staff Present Leana Roe, BS, Exercise Physiologist;Dalton Sherrie George, MS, ACSM-CEP;Melven Sartorius BSN, RN    Virtual Visit No    Medication changes reported     No    Fall or balance concerns reported    No    Tobacco Cessation No Change    Warm-up and Cool-down Performed as group-led Higher education careers adviser Performed Yes    VAD Patient? No    PAD/SET Patient? No      Pain Assessment   Currently in Pain? No/denies    Pain Score 0-No pain    Multiple Pain Sites No             Capillary Blood Glucose: No results found for this or any previous visit (from the past 24 hour(s)).    Social History   Tobacco Use  Smoking Status Former   Packs/day: 0.25   Types: Cigarettes   Quit date: 05/2021   Years since quitting: 0.8  Smokeless Tobacco Never    Goals Met:  Proper associated with RPD/PD & O2 Sat Independence with exercise equipment Using PLB without cueing & demonstrates good technique Exercise tolerated well Queuing for purse lip breathing No report of concerns or symptoms today Strength training completed today  Goals Unmet:  Not Applicable  Comments: Check out at 14:30   Dr. Kathie Dike is Medical Director for [facility].

## 2022-03-14 NOTE — Progress Notes (Signed)
Navigator met patient on 3/5 at his initial MedOnc consult with Dr.Mohamed. He is accompanied by his wife, Altha Harm.  Current plan for treatment is chemo-radiation to start on 3/11.  Pt denies financial concerns and denies difficulty with access to transportation or food disparities.  Written education materials on his chemotherapy medications and the lung cancer care booklet provided to the pt.  Navigator provided pt and his wife with contact information and encouraged questions or concerns at any time.

## 2022-03-18 ENCOUNTER — Encounter (HOSPITAL_COMMUNITY): Payer: Self-pay | Admitting: Radiology

## 2022-03-18 ENCOUNTER — Ambulatory Visit (HOSPITAL_COMMUNITY)
Admission: RE | Admit: 2022-03-18 | Discharge: 2022-03-18 | Disposition: A | Discharge status: Home / Self Care | Payer: Medicare Other | Source: Ambulatory Visit | Attending: Internal Medicine | Admitting: Internal Medicine

## 2022-03-18 DIAGNOSIS — C3412 Malignant neoplasm of upper lobe, left bronchus or lung: Secondary | ICD-10-CM | POA: Diagnosis not present

## 2022-03-18 DIAGNOSIS — Z5111 Encounter for antineoplastic chemotherapy: Secondary | ICD-10-CM | POA: Diagnosis not present

## 2022-03-18 DIAGNOSIS — G629 Polyneuropathy, unspecified: Secondary | ICD-10-CM | POA: Diagnosis not present

## 2022-03-18 DIAGNOSIS — C3411 Malignant neoplasm of upper lobe, right bronchus or lung: Secondary | ICD-10-CM | POA: Diagnosis not present

## 2022-03-18 DIAGNOSIS — Z806 Family history of leukemia: Secondary | ICD-10-CM | POA: Diagnosis not present

## 2022-03-18 DIAGNOSIS — I110 Hypertensive heart disease with heart failure: Secondary | ICD-10-CM | POA: Diagnosis not present

## 2022-03-18 DIAGNOSIS — Z803 Family history of malignant neoplasm of breast: Secondary | ICD-10-CM | POA: Diagnosis not present

## 2022-03-18 DIAGNOSIS — I509 Heart failure, unspecified: Secondary | ICD-10-CM | POA: Diagnosis not present

## 2022-03-18 DIAGNOSIS — Z510 Encounter for antineoplastic radiation therapy: Secondary | ICD-10-CM | POA: Diagnosis not present

## 2022-03-18 DIAGNOSIS — I2510 Atherosclerotic heart disease of native coronary artery without angina pectoris: Secondary | ICD-10-CM | POA: Diagnosis not present

## 2022-03-18 DIAGNOSIS — G319 Degenerative disease of nervous system, unspecified: Secondary | ICD-10-CM | POA: Diagnosis not present

## 2022-03-18 DIAGNOSIS — K5900 Constipation, unspecified: Secondary | ICD-10-CM | POA: Diagnosis not present

## 2022-03-18 DIAGNOSIS — R82998 Other abnormal findings in urine: Secondary | ICD-10-CM | POA: Diagnosis not present

## 2022-03-18 DIAGNOSIS — C3490 Malignant neoplasm of unspecified part of unspecified bronchus or lung: Secondary | ICD-10-CM | POA: Diagnosis not present

## 2022-03-18 DIAGNOSIS — R3915 Urgency of urination: Secondary | ICD-10-CM | POA: Diagnosis not present

## 2022-03-18 DIAGNOSIS — Z87891 Personal history of nicotine dependence: Secondary | ICD-10-CM | POA: Diagnosis not present

## 2022-03-18 MED ORDER — GADOBUTROL 1 MMOL/ML IV SOLN
8.0000 mL | Freq: Once | INTRAVENOUS | Status: AC | PRN
  Administered 2022-03-18: 8 mL via INTRAVENOUS

## 2022-03-19 ENCOUNTER — Other Ambulatory Visit: Payer: Self-pay

## 2022-03-19 ENCOUNTER — Ambulatory Visit
Admission: RE | Admit: 2022-03-19 | Discharge: 2022-03-19 | Disposition: A | Discharge status: Home / Self Care | Payer: Medicare Other | Source: Ambulatory Visit | Attending: Radiation Oncology | Admitting: Radiation Oncology

## 2022-03-19 ENCOUNTER — Other Ambulatory Visit: Payer: Self-pay | Admitting: Medical Oncology

## 2022-03-19 ENCOUNTER — Inpatient Hospital Stay: Payer: Medicare Other

## 2022-03-19 ENCOUNTER — Other Ambulatory Visit: Payer: Medicare Other

## 2022-03-19 ENCOUNTER — Encounter (HOSPITAL_COMMUNITY): Payer: Medicare Other

## 2022-03-19 DIAGNOSIS — Z5111 Encounter for antineoplastic chemotherapy: Secondary | ICD-10-CM | POA: Diagnosis not present

## 2022-03-19 DIAGNOSIS — G629 Polyneuropathy, unspecified: Secondary | ICD-10-CM | POA: Diagnosis not present

## 2022-03-19 DIAGNOSIS — Z803 Family history of malignant neoplasm of breast: Secondary | ICD-10-CM | POA: Diagnosis not present

## 2022-03-19 DIAGNOSIS — C3491 Malignant neoplasm of unspecified part of right bronchus or lung: Secondary | ICD-10-CM

## 2022-03-19 DIAGNOSIS — Z510 Encounter for antineoplastic radiation therapy: Secondary | ICD-10-CM | POA: Diagnosis not present

## 2022-03-19 DIAGNOSIS — I509 Heart failure, unspecified: Secondary | ICD-10-CM | POA: Diagnosis not present

## 2022-03-19 DIAGNOSIS — C3411 Malignant neoplasm of upper lobe, right bronchus or lung: Secondary | ICD-10-CM | POA: Diagnosis not present

## 2022-03-19 DIAGNOSIS — I2510 Atherosclerotic heart disease of native coronary artery without angina pectoris: Secondary | ICD-10-CM | POA: Diagnosis not present

## 2022-03-19 DIAGNOSIS — I110 Hypertensive heart disease with heart failure: Secondary | ICD-10-CM | POA: Diagnosis not present

## 2022-03-19 DIAGNOSIS — K5900 Constipation, unspecified: Secondary | ICD-10-CM | POA: Diagnosis not present

## 2022-03-19 DIAGNOSIS — Z806 Family history of leukemia: Secondary | ICD-10-CM | POA: Diagnosis not present

## 2022-03-19 DIAGNOSIS — Z87891 Personal history of nicotine dependence: Secondary | ICD-10-CM | POA: Diagnosis not present

## 2022-03-19 DIAGNOSIS — R82998 Other abnormal findings in urine: Secondary | ICD-10-CM | POA: Diagnosis not present

## 2022-03-19 DIAGNOSIS — C3412 Malignant neoplasm of upper lobe, left bronchus or lung: Secondary | ICD-10-CM | POA: Diagnosis not present

## 2022-03-19 DIAGNOSIS — R3915 Urgency of urination: Secondary | ICD-10-CM | POA: Diagnosis not present

## 2022-03-19 LAB — RAD ONC ARIA SESSION SUMMARY
Course Elapsed Days: 0
Plan Fractions Treated to Date: 1
Plan Prescribed Dose Per Fraction: 2 Gy
Plan Total Fractions Prescribed: 30
Plan Total Prescribed Dose: 60 Gy
Reference Point Dosage Given to Date: 2 Gy
Reference Point Session Dosage Given: 2 Gy
Session Number: 1

## 2022-03-19 MED ORDER — PROCHLORPERAZINE MALEATE 10 MG PO TABS: 10.0000 mg | ORAL_TABLET | Freq: Four times a day (QID) | ORAL | 0 refills | Status: DC | PRN

## 2022-03-19 MED ORDER — SONAFINE EX EMUL
1.0000 | Freq: Once | CUTANEOUS | Status: AC
  Administered 2022-03-19: 1 via TOPICAL

## 2022-03-19 MED ORDER — CYANOCOBALAMIN 1000 MCG/ML IJ SOLN
1000.0000 ug | Freq: Once | INTRAMUSCULAR | Status: AC
  Administered 2022-03-19: 1000 ug via INTRAMUSCULAR
  Filled 2022-03-19: qty 1

## 2022-03-19 MED FILL — Dexamethasone Sodium Phosphate Inj 100 MG/10ML: INTRAMUSCULAR | Qty: 1 | Status: AC

## 2022-03-19 MED FILL — Fosaprepitant Dimeglumine For IV Infusion 150 MG (Base Eq): INTRAVENOUS | Qty: 5 | Status: AC

## 2022-03-19 NOTE — Patient Instructions (Signed)

## 2022-03-20 ENCOUNTER — Inpatient Hospital Stay: Payer: Medicare Other

## 2022-03-20 ENCOUNTER — Ambulatory Visit
Admission: RE | Admit: 2022-03-20 | Discharge: 2022-03-20 | Disposition: A | Discharge status: Home / Self Care | Payer: Medicare Other | Source: Ambulatory Visit | Attending: Radiation Oncology | Admitting: Radiation Oncology

## 2022-03-20 ENCOUNTER — Encounter: Payer: Self-pay | Admitting: General Practice

## 2022-03-20 ENCOUNTER — Other Ambulatory Visit: Payer: Self-pay

## 2022-03-20 VITALS — BP 146/64 | HR 56 | Temp 97.8°F | Resp 14 | Ht 72.0 in | Wt 186.6 lb

## 2022-03-20 DIAGNOSIS — G629 Polyneuropathy, unspecified: Secondary | ICD-10-CM | POA: Diagnosis not present

## 2022-03-20 DIAGNOSIS — Z803 Family history of malignant neoplasm of breast: Secondary | ICD-10-CM | POA: Diagnosis not present

## 2022-03-20 DIAGNOSIS — I2510 Atherosclerotic heart disease of native coronary artery without angina pectoris: Secondary | ICD-10-CM | POA: Diagnosis not present

## 2022-03-20 DIAGNOSIS — K5900 Constipation, unspecified: Secondary | ICD-10-CM | POA: Diagnosis not present

## 2022-03-20 DIAGNOSIS — I509 Heart failure, unspecified: Secondary | ICD-10-CM | POA: Diagnosis not present

## 2022-03-20 DIAGNOSIS — Z5111 Encounter for antineoplastic chemotherapy: Secondary | ICD-10-CM | POA: Diagnosis not present

## 2022-03-20 DIAGNOSIS — C3411 Malignant neoplasm of upper lobe, right bronchus or lung: Secondary | ICD-10-CM | POA: Diagnosis not present

## 2022-03-20 DIAGNOSIS — C3491 Malignant neoplasm of unspecified part of right bronchus or lung: Secondary | ICD-10-CM

## 2022-03-20 DIAGNOSIS — I110 Hypertensive heart disease with heart failure: Secondary | ICD-10-CM | POA: Diagnosis not present

## 2022-03-20 DIAGNOSIS — Z95828 Presence of other vascular implants and grafts: Secondary | ICD-10-CM

## 2022-03-20 DIAGNOSIS — C3412 Malignant neoplasm of upper lobe, left bronchus or lung: Secondary | ICD-10-CM | POA: Diagnosis not present

## 2022-03-20 DIAGNOSIS — Z87891 Personal history of nicotine dependence: Secondary | ICD-10-CM | POA: Diagnosis not present

## 2022-03-20 DIAGNOSIS — R82998 Other abnormal findings in urine: Secondary | ICD-10-CM | POA: Diagnosis not present

## 2022-03-20 DIAGNOSIS — Z510 Encounter for antineoplastic radiation therapy: Secondary | ICD-10-CM | POA: Diagnosis not present

## 2022-03-20 DIAGNOSIS — R3915 Urgency of urination: Secondary | ICD-10-CM | POA: Diagnosis not present

## 2022-03-20 DIAGNOSIS — Z806 Family history of leukemia: Secondary | ICD-10-CM | POA: Diagnosis not present

## 2022-03-20 LAB — CBC WITH DIFFERENTIAL (CANCER CENTER ONLY)
Abs Immature Granulocytes: 0.03 10*3/uL (ref 0.00–0.07)
Basophils Absolute: 0 10*3/uL (ref 0.0–0.1)
Basophils Relative: 0 %
Eosinophils Absolute: 0 10*3/uL (ref 0.0–0.5)
Eosinophils Relative: 0 %
HCT: 39.5 % (ref 39.0–52.0)
Hemoglobin: 13.1 g/dL (ref 13.0–17.0)
Immature Granulocytes: 0 %
Lymphocytes Relative: 10 %
Lymphs Abs: 1 10*3/uL (ref 0.7–4.0)
MCH: 30.7 pg (ref 26.0–34.0)
MCHC: 33.2 g/dL (ref 30.0–36.0)
MCV: 92.5 fL (ref 80.0–100.0)
Monocytes Absolute: 0.8 10*3/uL (ref 0.1–1.0)
Monocytes Relative: 8 %
Neutro Abs: 8 10*3/uL — ABNORMAL HIGH (ref 1.7–7.7)
Neutrophils Relative %: 82 %
Platelet Count: 195 10*3/uL (ref 150–400)
RBC: 4.27 MIL/uL (ref 4.22–5.81)
RDW: 13.3 % (ref 11.5–15.5)
WBC Count: 9.9 10*3/uL (ref 4.0–10.5)
nRBC: 0 % (ref 0.0–0.2)

## 2022-03-20 LAB — CMP (CANCER CENTER ONLY)
ALT: 12 U/L (ref 0–44)
AST: 21 U/L (ref 15–41)
Albumin: 4 g/dL (ref 3.5–5.0)
Alkaline Phosphatase: 57 U/L (ref 38–126)
Anion gap: 5 (ref 5–15)
BUN: 21 mg/dL (ref 8–23)
CO2: 28 mmol/L (ref 22–32)
Calcium: 9.5 mg/dL (ref 8.9–10.3)
Chloride: 104 mmol/L (ref 98–111)
Creatinine: 0.89 mg/dL (ref 0.61–1.24)
GFR, Estimated: >60 mL/min (ref >60)
Glucose, Bld: 117 mg/dL — ABNORMAL HIGH (ref 70–99)
Potassium: 4.1 mmol/L (ref 3.5–5.1)
Sodium: 137 mmol/L (ref 135–145)
Total Bilirubin: 0.4 mg/dL (ref 0.3–1.2)
Total Protein: 7.4 g/dL (ref 6.5–8.1)

## 2022-03-20 LAB — RAD ONC ARIA SESSION SUMMARY
Course Elapsed Days: 1
Plan Fractions Treated to Date: 2
Plan Prescribed Dose Per Fraction: 2 Gy
Plan Total Fractions Prescribed: 30
Plan Total Prescribed Dose: 60 Gy
Reference Point Dosage Given to Date: 4 Gy
Reference Point Session Dosage Given: 2 Gy
Session Number: 2

## 2022-03-20 MED ORDER — SODIUM CHLORIDE 0.9 % IV SOLN
10.0000 mg | Freq: Once | INTRAVENOUS | Status: AC
  Administered 2022-03-20: 10 mg via INTRAVENOUS
  Filled 2022-03-20: qty 10

## 2022-03-20 MED ORDER — SODIUM CHLORIDE 0.9 % IV SOLN
150.0000 mg | Freq: Once | INTRAVENOUS | Status: AC
  Administered 2022-03-20: 150 mg via INTRAVENOUS
  Filled 2022-03-20: qty 150

## 2022-03-20 MED ORDER — SODIUM CHLORIDE 0.9 % IV SOLN
500.0000 mg/m2 | Freq: Once | INTRAVENOUS | Status: AC
  Administered 2022-03-20: 1000 mg via INTRAVENOUS
  Filled 2022-03-20: qty 40

## 2022-03-20 MED ORDER — SODIUM CHLORIDE 0.9% FLUSH
10.0000 mL | INTRAVENOUS | Status: AC | PRN
  Administered 2022-03-20: 10 mL

## 2022-03-20 MED ORDER — PALONOSETRON HCL INJECTION 0.25 MG/5ML
0.2500 mg | Freq: Once | INTRAVENOUS | Status: AC
  Administered 2022-03-20: 0.25 mg via INTRAVENOUS
  Filled 2022-03-20: qty 5

## 2022-03-20 MED ORDER — HEPARIN SOD (PORK) LOCK FLUSH 100 UNIT/ML IV SOLN
500.0000 [IU] | Freq: Once | INTRAVENOUS | Status: AC | PRN
  Administered 2022-03-20: 500 [IU]

## 2022-03-20 MED ORDER — SODIUM CHLORIDE 0.9 % IV SOLN: Freq: Once | INTRAVENOUS | Status: AC

## 2022-03-20 MED ORDER — SODIUM CHLORIDE 0.9 % IV SOLN
479.5000 mg | Freq: Once | INTRAVENOUS | Status: AC
  Administered 2022-03-20: 480 mg via INTRAVENOUS
  Filled 2022-03-20: qty 48

## 2022-03-20 MED ORDER — SODIUM CHLORIDE 0.9% FLUSH
10.0000 mL | INTRAVENOUS | Status: DC | PRN
  Administered 2022-03-20: 10 mL

## 2022-03-20 NOTE — Progress Notes (Signed)
[place] Spiritual Care Note  Referred by nursing for additional layer of support. Met Patrick Harris in infusion, bringing packet of [facility]/Patient and Family Support programming information and handmade blessing blanket as tangible sign of care and encouragement.  Patrick Harris shared that he lost his daughter at age 77 due to breast cancer, and that his grandsons are sources of deep joy. He reports meaning from attending church regularly, as well as having people pray over him recently.  Provided reflective listening, emotional support, blessing, and invitation to attend Lung Cancer Support Group and to contact chaplain whenever needed/desired.  We plan to follow up at a future treatment.   [place], [place], [facility] Pager [phone] Voicemail [phone]

## 2022-03-20 NOTE — Patient Instructions (Signed)
[facility]  Discharge Instructions: Thank you for choosing [facility] to provide your oncology and hematology care.   If you have a lab appointment with the Cancer Center, please go directly to the Cancer Center and check in at the registration area.   Wear comfortable clothing and clothing appropriate for easy access to any Portacath or PICC line.   We strive to give you quality time with your provider. You may need to reschedule your appointment if you arrive late (15 or more minutes).  Arriving late affects you and other patients whose appointments are after yours.  Also, if you miss three or more appointments without notifying the office, you may be dismissed from the clinic at the provider's discretion.      For prescription refill requests, have your pharmacy contact our office and allow 72 hours for refills to be completed.    Today you received the following chemotherapy and/or immunotherapy agents: Alimta/Carboplatin   To help prevent nausea and vomiting after your treatment, we encourage you to take your nausea medication as directed.  BELOW ARE SYMPTOMS THAT SHOULD BE REPORTED IMMEDIATELY: *FEVER GREATER THAN 100.4 F (38 C) OR HIGHER *CHILLS OR SWEATING *NAUSEA AND VOMITING THAT IS NOT CONTROLLED WITH YOUR NAUSEA MEDICATION *UNUSUAL SHORTNESS OF BREATH *UNUSUAL BRUISING OR BLEEDING *URINARY PROBLEMS (pain or burning when urinating, or frequent urination) *BOWEL PROBLEMS (unusual diarrhea, constipation, pain near the anus) TENDERNESS IN MOUTH AND THROAT WITH OR WITHOUT PRESENCE OF ULCERS (sore throat, sores in mouth, or a toothache) UNUSUAL RASH, SWELLING OR PAIN  UNUSUAL VAGINAL DISCHARGE OR ITCHING   Items with * indicate a potential emergency and should be followed up as soon as possible or go to the Emergency Department if any problems should occur.  Please show the CHEMOTHERAPY ALERT CARD or IMMUNOTHERAPY ALERT CARD at  check-in to the Emergency Department and triage nurse.  Should you have questions after your visit or need to cancel or reschedule your appointment, please contact [facility]  Dept: [phone]  and follow the prompts.  Office hours are 8:00 a.m. to 4:30 p.m. Monday - Friday. Please note that voicemails left after 4:00 p.m. may not be returned until the following business day.  We are closed weekends and major holidays. You have access to a nurse at all times for urgent questions. Please call the main number to the clinic Dept: [phone] and follow the prompts.   For any non-urgent questions, you may also contact your provider using MyChart. We now offer e-Visits for anyone 18 and older to request care online for non-urgent symptoms. For details visit [email or web address].   Also download the MyChart app! Go to the app store, search "MyChart", open the app, select , and log in with your MyChart username and password.   

## 2022-03-21 ENCOUNTER — Other Ambulatory Visit: Payer: Self-pay

## 2022-03-21 ENCOUNTER — Ambulatory Visit
Admission: RE | Admit: 2022-03-21 | Discharge: 2022-03-21 | Disposition: A | Discharge status: Home / Self Care | Payer: Medicare Other | Source: Ambulatory Visit | Attending: Radiation Oncology | Admitting: Radiation Oncology

## 2022-03-21 ENCOUNTER — Encounter (HOSPITAL_COMMUNITY)
Admission: RE | Admit: 2022-03-21 | Discharge: 2022-03-21 | Disposition: A | Discharge status: Home / Self Care | Payer: Medicare Other | Source: Ambulatory Visit | Attending: Thoracic Surgery (Cardiothoracic Vascular Surgery) | Admitting: Thoracic Surgery (Cardiothoracic Vascular Surgery)

## 2022-03-21 ENCOUNTER — Telehealth: Payer: Self-pay

## 2022-03-21 DIAGNOSIS — Z5111 Encounter for antineoplastic chemotherapy: Secondary | ICD-10-CM | POA: Diagnosis not present

## 2022-03-21 DIAGNOSIS — C3411 Malignant neoplasm of upper lobe, right bronchus or lung: Secondary | ICD-10-CM | POA: Diagnosis not present

## 2022-03-21 DIAGNOSIS — R3915 Urgency of urination: Secondary | ICD-10-CM | POA: Diagnosis not present

## 2022-03-21 DIAGNOSIS — Z510 Encounter for antineoplastic radiation therapy: Secondary | ICD-10-CM | POA: Diagnosis not present

## 2022-03-21 DIAGNOSIS — I110 Hypertensive heart disease with heart failure: Secondary | ICD-10-CM | POA: Diagnosis not present

## 2022-03-21 DIAGNOSIS — K5900 Constipation, unspecified: Secondary | ICD-10-CM | POA: Diagnosis not present

## 2022-03-21 DIAGNOSIS — G629 Polyneuropathy, unspecified: Secondary | ICD-10-CM | POA: Diagnosis not present

## 2022-03-21 DIAGNOSIS — C3412 Malignant neoplasm of upper lobe, left bronchus or lung: Secondary | ICD-10-CM | POA: Diagnosis not present

## 2022-03-21 DIAGNOSIS — Z87891 Personal history of nicotine dependence: Secondary | ICD-10-CM | POA: Diagnosis not present

## 2022-03-21 DIAGNOSIS — I509 Heart failure, unspecified: Secondary | ICD-10-CM | POA: Diagnosis not present

## 2022-03-21 DIAGNOSIS — Z803 Family history of malignant neoplasm of breast: Secondary | ICD-10-CM | POA: Diagnosis not present

## 2022-03-21 DIAGNOSIS — I2510 Atherosclerotic heart disease of native coronary artery without angina pectoris: Secondary | ICD-10-CM | POA: Diagnosis not present

## 2022-03-21 DIAGNOSIS — R82998 Other abnormal findings in urine: Secondary | ICD-10-CM | POA: Diagnosis not present

## 2022-03-21 DIAGNOSIS — Z806 Family history of leukemia: Secondary | ICD-10-CM | POA: Diagnosis not present

## 2022-03-21 LAB — RAD ONC ARIA SESSION SUMMARY
Course Elapsed Days: 2
Plan Fractions Treated to Date: 3
Plan Prescribed Dose Per Fraction: 2 Gy
Plan Total Fractions Prescribed: 30
Plan Total Prescribed Dose: 60 Gy
Reference Point Dosage Given to Date: 6 Gy
Reference Point Session Dosage Given: 2 Gy
Session Number: 3

## 2022-03-21 NOTE — Progress Notes (Signed)
Daily Session Note  Patient Details  Name: Patrick Harris MRN: [place]:[id number] Date of Birth: Sep 20, 1945 Referring Provider:   [place] from 12/27/2021 in [place]  Referring Provider Dr. Williemae Natter       Encounter Date: 03/21/2022  Check In:  Session Check In - 03/21/22 1328       Check-In   Supervising physician immediately available to respond to emergencies [facility] MD immediately available    Physician(s) Dr. Dellia Cloud    Location [facility]    Staff Present Leana Roe, BS, Exercise Physiologist;Ngozi Alvidrez Wynetta Emery, RN, BSN;Other   Benedict Needy   Virtual Visit No    Medication changes reported     No    Fall or balance concerns reported    No    Tobacco Cessation No Change    Warm-up and Cool-down Performed as group-led instruction    Resistance Training Performed Yes    VAD Patient? No    PAD/SET Patient? No      Pain Assessment   Currently in Pain? No/denies    Pain Score 0-No pain    Multiple Pain Sites No             Capillary Blood Glucose: No results found for this or any previous visit (from the past 24 hour(s)).    Social History   Tobacco Use  Smoking Status Former   Packs/day: .25   Types: Cigarettes   Quit date: 05/2021   Years since quitting: 0.8  Smokeless Tobacco Never    Goals Met:  Proper associated with RPD/PD & O2 Sat Independence with exercise equipment Using PLB without cueing & demonstrates good technique Exercise tolerated well No report of concerns or symptoms today Strength training completed today  Goals Unmet:  Not Applicable  Comments: Check out 1430.   Dr. Kathie Dike is Medical Director for [facility].

## 2022-03-21 NOTE — Telephone Encounter (Signed)
Patrick Harris states that he is doing fine. He is eating, drinking, and urinating well. He knows to call the office at [phone] if he has any questions or concerns.

## 2022-03-21 NOTE — Telephone Encounter (Signed)
-----   Message from Willis Modena, RN sent at 03/20/2022 12:05 PM EDT ----- Regarding: Dr. Julien Nordmann 1st time Alimta/Carbo f/u tol well Dr. Julien Nordmann 1st time Alimta/Carboplatin. Pt tolerated tx well without incident. Pt call back due.

## 2022-03-22 ENCOUNTER — Other Ambulatory Visit: Payer: Self-pay

## 2022-03-22 ENCOUNTER — Ambulatory Visit
Admission: RE | Admit: 2022-03-22 | Discharge: 2022-03-22 | Disposition: A | Discharge status: Home / Self Care | Payer: Medicare Other | Source: Ambulatory Visit | Attending: Radiation Oncology | Admitting: Radiation Oncology

## 2022-03-22 DIAGNOSIS — K5900 Constipation, unspecified: Secondary | ICD-10-CM | POA: Diagnosis not present

## 2022-03-22 DIAGNOSIS — R82998 Other abnormal findings in urine: Secondary | ICD-10-CM | POA: Diagnosis not present

## 2022-03-22 DIAGNOSIS — Z510 Encounter for antineoplastic radiation therapy: Secondary | ICD-10-CM | POA: Diagnosis not present

## 2022-03-22 DIAGNOSIS — I509 Heart failure, unspecified: Secondary | ICD-10-CM | POA: Diagnosis not present

## 2022-03-22 DIAGNOSIS — I110 Hypertensive heart disease with heart failure: Secondary | ICD-10-CM | POA: Diagnosis not present

## 2022-03-22 DIAGNOSIS — R3915 Urgency of urination: Secondary | ICD-10-CM | POA: Diagnosis not present

## 2022-03-22 DIAGNOSIS — Z803 Family history of malignant neoplasm of breast: Secondary | ICD-10-CM | POA: Diagnosis not present

## 2022-03-22 DIAGNOSIS — C3411 Malignant neoplasm of upper lobe, right bronchus or lung: Secondary | ICD-10-CM | POA: Diagnosis not present

## 2022-03-22 DIAGNOSIS — I2510 Atherosclerotic heart disease of native coronary artery without angina pectoris: Secondary | ICD-10-CM | POA: Diagnosis not present

## 2022-03-22 DIAGNOSIS — Z806 Family history of leukemia: Secondary | ICD-10-CM | POA: Diagnosis not present

## 2022-03-22 DIAGNOSIS — Z5111 Encounter for antineoplastic chemotherapy: Secondary | ICD-10-CM | POA: Diagnosis not present

## 2022-03-22 DIAGNOSIS — G629 Polyneuropathy, unspecified: Secondary | ICD-10-CM | POA: Diagnosis not present

## 2022-03-22 DIAGNOSIS — Z87891 Personal history of nicotine dependence: Secondary | ICD-10-CM | POA: Diagnosis not present

## 2022-03-22 DIAGNOSIS — C3412 Malignant neoplasm of upper lobe, left bronchus or lung: Secondary | ICD-10-CM | POA: Diagnosis not present

## 2022-03-22 LAB — RAD ONC ARIA SESSION SUMMARY
Course Elapsed Days: 3
Plan Fractions Treated to Date: 4
Plan Prescribed Dose Per Fraction: 2 Gy
Plan Total Fractions Prescribed: 30
Plan Total Prescribed Dose: 60 Gy
Reference Point Dosage Given to Date: 8 Gy
Reference Point Session Dosage Given: 2 Gy
Session Number: 4

## 2022-03-24 NOTE — Progress Notes (Unsigned)
[place] OFFICE PROGRESS NOTE  [name], MD [place] [address]  DIAGNOSIS: Recurrent non-small cell lung cancer, adenocarcinoma that was initially diagnosed as multifocal synchronous disease including stage IIIA (T1c, M2, M0) adenocarcinoma in the right upper lobe as well as stage IA (T1c, N0, M0) squamous cell carcinoma involving the right upper lobe and stage IA (T1b, N0, M0) adenocarcinoma involving the left upper lobe diagnosed in June 2023. After neoadjuvant treatment, final pathology showing residual disease with synchronous adenocarcinoma as well as squamous cell carcinoma in the right upper lobe and multiple mediastinal lymph node involvement performed on November 13, 2021. Unfortunately patient was found to have evidence for disease recurrence with involvement of 4R lymphadenopathy consistent with metastatic adenocarcinoma on February 26, 2022 biopsy.   Molecular studies showed no actionable mutations and PD-L1 expression was 15% in the adenocarcinoma in 2% and the squamous cell carcinoma.   PRIOR THERAPY: 1) neoadjuvant systemic chemotherapy with carboplatin, paclitaxel and nivolumab for 3 cycles in [facility].  2) right upper lobectomy with lymph node dissection at [facility] under the care of Dr. Williemae Natter   CURRENT THERAPY: concurrent chemotherapy with carboplatin for AUC of 5 and Alimta 500 Mg/M2 every 3 weeks for 2 cycles during the course of radiotherapy. He is status post 1 cycle of chemotherapy.   INTERVAL HISTORY: Patrick Harris 77 y.o. male returns to the clinic today for a follow-up visit accompanied by his wife.  The patient is feeling well today without any concerning complaints. The patient is currently undergoing chemo and radiation.  He is status post his first cycle of chemo last week which he tolerated well.  His last day radiation is tentatively scheduled for 04/29/2022.  The patient denies any fever, chills, night  sweats, or unexplained weight loss.  He reports mild dyspnea on exertion with standing up sometimes which is unchanged.  He also mentions mild cough occasionally which is unchanged.  He reports the chest pain from his surgery is getting better.  He sometimes experiences constipation for which she is taking laxatives.  He is not taking any stool softeners.  Denies any hemoptysis.  Denies any nausea, vomiting, or diarrhea.  Mentions that he has been getting up more the last 3-4 nights to urinate at nighttime.  Denies any changes in his fluid intake.  Denies any diabetes.  Denies any dysuria or cloudy urine but his urine does have a smell.  Denies any rashes or skin changes.  Denies any headache or visual changes.  He is here today for evaluation and repeat blood work in 1 week follow-up visit to manage any adverse side effects of treatment   MEDICAL HISTORY: Past Medical History:  Diagnosis Date   Arthritis    CAD (coronary artery disease)    CABG with LIMA to LAD and RIMA to RCA August 2000 - Dr. Roxan Hockey   Cancer [facility])    basal cell   CHF (congestive heart failure) (HCC)    COPD (chronic obstructive pulmonary disease) (HCC)    Elevated glycated hemoglobin    Essential hypertension    GERD (gastroesophageal reflux disease)    Heart murmur    Hyperlipidemia    Hypopituitarism ([place])    Lung cancer (HCC)    Osteopenia    Dr Wilson Singer   Pancreatitis    Peyronie disease    Testosterone deficiency    Dr Wilson Singer    ALLERGIES:  is allergic to latex, bromocriptine, zestril [lisinopril],  diovan [valsartan], hydrochlorothiazide, norvasc [amlodipine], and zocor [simvastatin].  MEDICATIONS:  Current Outpatient Medications  Medication Sig Dispense Refill   acetaminophen (TYLENOL) 500 MG tablet Take 500 mg by mouth every 6 (six) hours as needed (for pain.).     aspirin EC 81 MG tablet Take 1 tablet (81 mg total) by mouth at bedtime. Okay to restart this medication on 07/03/2021 30 tablet 11    Cholecalciferol (VITAMIN D3) 2000 UNITS TABS Take 2,000 Units by mouth in the morning and at bedtime.     Coenzyme Q10 (COQ-10) 100 MG CAPS Take 100 mg by mouth in the morning.     Cyanocobalamin (B-12 PO) Take 1 tablet by mouth daily.     dexamethasone (DECADRON) 4 MG tablet 1 tab p.o. twice daily the day before, day of and day after chemotherapy. 20 tablet 0   folic acid (FOLVITE) 1 MG tablet Take 1 tablet (1 mg total) by mouth daily. 30 tablet 1   hydrALAZINE (APRESOLINE) 25 MG tablet Take 1 tablet (25 mg total) by mouth 3 (three) times daily. 270 tablet 3   irbesartan (AVAPRO) 300 MG tablet Take 1 tablet (300 mg total) by mouth daily. 90 tablet 3   nitroGLYCERIN (NITROSTAT) 0.4 MG SL tablet Place 1 tablet (0.4 mg total) under the tongue every 5 (five) minutes x 3 doses as needed for chest pain (if no relief after 2nd dose, proceed to ED or call 911). 25 tablet 3   Omega-3 Fatty Acids (OMEGA-3 FISH OIL PO) Take 1,000 mg by mouth in the morning.     omeprazole (PRILOSEC) 40 MG capsule Take 1 capsule (40 mg total) by mouth in the morning.     ondansetron (ZOFRAN) 8 MG tablet Take 1 tablet (8 mg total) by mouth every 8 (eight) hours as needed for vomiting or nausea. 20 tablet 0   Polyethyl Glycol-Propyl Glycol (LUBRICANT EYE DROPS) 0.4-0.3 % SOLN Place 1-2 drops into both eyes 3 (three) times daily as needed (dry/irritated eyes.).     prochlorperazine (COMPAZINE) 10 MG tablet Take 1 tablet (10 mg total) by mouth every 6 (six) hours as needed for nausea or vomiting. 30 tablet 0   rosuvastatin (CRESTOR) 20 MG tablet TAKE HALF A TABLET (10 MG) BY MOUTH ONCE DAILY. 45 tablet 3   sucralfate (CARAFATE) 1 g tablet Take 1 tablet (1 g total) by mouth 4 (four) times daily -  with meals and at bedtime. Crush and dissolve in 10 mL warm water prior to swallowing, take 20 min before meals 120 tablet 1   tadalafil (CIALIS) 5 MG tablet Take 5 mg by mouth daily as needed for erectile dysfunction.     tamsulosin  (FLOMAX) 0.4 MG CAPS capsule Take 0.4 mg by mouth in the morning.     Testosterone 20.25 MG/ACT (1.62%) GEL Apply 2 Pump topically in the morning.     vitamin C (ASCORBIC ACID) 500 MG tablet Take 500 mg by mouth in the morning.     No current facility-administered medications for this visit.    SURGICAL HISTORY:  Past Surgical History:  Procedure Laterality Date   Bone spur  2006   5th toe bilaterally   BRONCHIAL BIOPSY  07/02/2021   Procedure: BRONCHIAL BIOPSIES;  Surgeon: Collene Gobble, MD;  Location: [facility] ENDOSCOPY;  Service: Pulmonary;;   BRONCHIAL BRUSHINGS  07/02/2021   Procedure: BRONCHIAL BRUSHINGS;  Surgeon: Collene Gobble, MD;  Location: [facility] ENDOSCOPY;  Service: Pulmonary;;   BRONCHIAL NEEDLE ASPIRATION BIOPSY  07/02/2021  Procedure: BRONCHIAL NEEDLE ASPIRATION BIOPSIES;  Surgeon: Collene Gobble, MD;  Location: [facility] ENDOSCOPY;  Service: Pulmonary;;   COLONOSCOPY  2007   negative; Dr Sharlett Iles   CORONARY ARTERY BYPASS GRAFT  2001   2 vessels   FIDUCIAL MARKER PLACEMENT  07/02/2021   Procedure: FIDUCIAL MARKER PLACEMENT;  Surgeon: Collene Gobble, MD;  Location: [facility] ENDOSCOPY;  Service: Pulmonary;;   fracture heel  1967   Bil/ due to fall off ladder   Fractured calcaneus  1969   bilaterally w/ ankle fusion   [place]   right inguinal hernia   KNEE SURGERY Left    LOBECTOMY Right 11/13/2021   MENISECTOMY  2007   R medial, Dr. Lorin Mercy   PORTA CATH INSERTION     SHOULDER SURGERY  1994   left   UPPER GASTROINTESTINAL ENDOSCOPY  2007   GERD, Dr Sharlett Iles   VASECTOMY     VIDEO BRONCHOSCOPY WITH RADIAL ENDOBRONCHIAL ULTRASOUND  07/02/2021   Procedure: VIDEO BRONCHOSCOPY WITH RADIAL ENDOBRONCHIAL ULTRASOUND;  Surgeon: Collene Gobble, MD;  Location: [facility] ENDOSCOPY;  Service: Pulmonary;;    REVIEW OF SYSTEMS:   Review of Systems  Constitutional: Negative for appetite change, chills, fatigue, fever and unexpected weight change.  HENT: Negative for mouth sores,  nosebleeds, sore throat and trouble swallowing.   Eyes: Negative for eye problems and icterus.  Respiratory: Positive for mild occasional cough and mild occasional dyspnea with standing up.  Negative for hemoptysis and wheezing.   Cardiovascular: Negative for chest pain and leg swelling.  Gastrointestinal: Negative for abdominal pain, constipation, diarrhea, nausea and vomiting.  Genitourinary: Positive for increased urination.  Negative for bladder incontinence, difficulty urinating, dysuria, frequency and hematuria.   Musculoskeletal: Negative for back pain, gait problem, neck pain and neck stiffness.  Skin: Negative for itching and rash.  Neurological: Negative for dizziness, extremity weakness, gait problem, headaches, light-headedness and seizures.  Hematological: Negative for adenopathy. Does not bruise/bleed easily.  Psychiatric/Behavioral: Negative for confusion, depression and sleep disturbance. The patient is not nervous/anxious.     PHYSICAL EXAMINATION:  Blood pressure 111/62, pulse 72, temperature 97.8 F (36.6 C), temperature source Temporal, resp. rate 17, height 6' (1.829 m), weight 184 lb 8 oz (83.7 kg), SpO2 98 %.  ECOG PERFORMANCE STATUS: 1  Physical Exam  Constitutional: Oriented to person, place, and time and well-developed, well-nourished, and in no distress.  HENT:  Head: Normocephalic and atraumatic.  Mouth/Throat: Oropharynx is clear and moist. No oropharyngeal exudate.  Eyes: Conjunctivae are normal. Right eye exhibits no discharge. Left eye exhibits no discharge. No scleral icterus.  Neck: Normal range of motion. Neck supple.  Cardiovascular: Normal rate, regular rhythm, normal heart sounds and intact distal pulses.   Pulmonary/Chest: Effort normal and breath sounds normal. No respiratory distress. No wheezes. No rales.  Abdominal: Soft. Bowel sounds are normal. Exhibits no distension and no mass. There is no tenderness.  Musculoskeletal: Normal range of  motion. Exhibits no edema.  Lymphadenopathy:    No cervical adenopathy.  Neurological: Alert and oriented to person, place, and time. Exhibits normal muscle tone. Gait normal. Coordination normal.  Skin: Skin is warm and dry. No rash noted. Not diaphoretic. No erythema. No pallor.  Psychiatric: Mood, memory and judgment normal.  Vitals reviewed.  LABORATORY DATA: Lab Results  Component Value Date   WBC 2.7 (L) 03/27/2022   HGB 14.3 03/27/2022   HCT 42.6 03/27/2022   MCV 92.6 03/27/2022   PLT 132 (L) 03/27/2022  Chemistry      Component Value Date/Time   NA 137 03/27/2022 0827   K 4.6 03/27/2022 0827   CL 103 03/27/2022 0827   CO2 30 03/27/2022 0827   BUN 16 03/27/2022 0827   CREATININE 0.99 03/27/2022 0827   CREATININE 1.07 08/25/2019 0847      Component Value Date/Time   CALCIUM 9.1 03/27/2022 0827   ALKPHOS 65 03/27/2022 0827   AST 13 (L) 03/27/2022 0827   ALT 13 03/27/2022 0827   BILITOT 0.7 03/27/2022 0827       RADIOGRAPHIC STUDIES:  MR BRAIN W WO CONTRAST  Result Date: 03/19/2022 CLINICAL DATA:  Provided history: Non-small cell lung cancer, staging. Malignant neoplasm of unspecified part of bronchus or lung. EXAM: MRI HEAD WITHOUT AND WITH CONTRAST TECHNIQUE: Multiplanar, multiecho pulse sequences of the brain and surrounding structures were obtained without and with intravenous contrast. CONTRAST:  32mL GADAVIST GADOBUTROL 1 MMOL/ML IV SOLN COMPARISON:  Brain MRI 07/23/2021. FINDINGS: Brain: Mild generalized cerebral atrophy. No cortical encephalomalacia is identified. No significant cerebral white matter disease for age. There are a few punctate chronic microhemorrhages scattered within the supratentorial brain. Small T2 hyperintense focus within the left thalamus, likely reflecting a prominent perivascular space. There is no acute infarct. No evidence of an intracranial mass. No extra-axial fluid collection. No midline shift. No pathologic intracranial  enhancement identified. Vascular: Maintained flow voids within the proximal large arterial vessels. Small developmental venous anomaly within the right frontal lobe (anatomic variant). Skull and upper cervical spine: No focal suspicious marrow lesion. Sinuses/Orbits: No mass or acute finding within the imaged orbits. Prior bilateral ocular lens replacement. Trace mucosal thickening within bilateral ethmoid air cells. Other: Small-volume fluid within the left mastoid air cells. IMPRESSION: 1. No evidence of intracranial metastatic disease. 2. There are a few punctate chronic microhemorrhages scattered within the supratentorial brain. 3. Mild generalized cerebral atrophy. 4. Small-volume fluid within the left mastoid air cells. Electronically Signed   By: Kellie Simmering D.O.   On: 03/19/2022 07:51   VAS [place] ABI WITH/WO TBI  Result Date: 03/05/2022  LOWER EXTREMITY DOPPLER STUDY Patient Name:  KIMONI CHABAN  Date of Exam:   03/05/2022 Medical Rec #: [id number]          Accession #:    [id number] Date of Birth: Jul 29, 1945           Patient Gender: M Patient Age:   15 years Exam Location:  [facility] Procedure:      VAS [place] ABI WITH/WO TBI Referring Phys: Jamelle Haring --------------------------------------------------------------------------------  Indications: Claudication, and peripheral artery disease. High Risk Factors: Hypertension, hyperlipidemia, coronary artery disease.  Performing Technologist: Ralene Cork RVT  Examination Guidelines: A complete evaluation includes at minimum, Doppler waveform signals and systolic blood pressure reading at the level of bilateral brachial, anterior tibial, and posterior tibial arteries, when vessel segments are accessible. Bilateral testing is considered an integral part of a complete examination. Photoelectric Plethysmograph (PPG) waveforms and toe systolic pressure readings are included as required and additional duplex testing as needed. Limited  examinations for reoccurring indications may be performed as noted.  ABI Findings: +---------+------------------+-----+----------+--------+ Right    Rt Pressure (mmHg)IndexWaveform  Comment  +---------+------------------+-----+----------+--------+ Brachial 142                                       +---------+------------------+-----+----------+--------+ PTA      113  0.80 biphasic           +---------+------------------+-----+----------+--------+ DP       108               0.76 monophasic         +---------+------------------+-----+----------+--------+ Great Toe93                0.65                    +---------+------------------+-----+----------+--------+ +---------+------------------+-----+--------+-------+ Left     Lt Pressure (mmHg)IndexWaveformComment +---------+------------------+-----+--------+-------+ Brachial 140                                    +---------+------------------+-----+--------+-------+ PTA      131               0.92 biphasic        +---------+------------------+-----+--------+-------+ DP       125               0.88 biphasic        +---------+------------------+-----+--------+-------+ Great Toe94                0.66                 +---------+------------------+-----+--------+-------+ +-------+-----------+-----------+------------+------------+ ABI/TBIToday's ABIToday's TBIPrevious ABIPrevious TBI +-------+-----------+-----------+------------+------------+ Right  0.8        0.65       0.68        0.53         +-------+-----------+-----------+------------+------------+ Left   0.92       0.66       0.82        0.62         +-------+-----------+-----------+------------+------------+  Previous ABI on 10/03/20.  Summary: Right: Resting right ankle-brachial index indicates mild right lower extremity arterial disease. The right toe-brachial index is abnormal. Left: Resting left ankle-brachial index indicates  mild left lower extremity arterial disease. The left toe-brachial index is abnormal. *See table(s) above for measurements and observations.  Electronically signed by Monica Martinez MD on 03/05/2022 at 9:29:33 AM.    Final      ASSESSMENT/PLAN:  This is a very pleasant 77 year old Caucasian male with recurrent non-small cell lung cancer, adenocarcinoma that was initially diagnosed as multifocal synchronous disease including stage IIIA (T1c, M2, M0) adenocarcinoma in the right upper lobe as well as stage IA (T1c, N0, M0) squamous cell carcinoma involving the right upper lobe and stage IA (T1b, N0, M0) adenocarcinoma involving the left upper lobe diagnosed in June 2023. The patient is status post neoadjuvant systemic chemotherapy with carboplatin, paclitaxel and nivolumab for 3 cycles in [facility] followed by right upper lobectomy with lymph node dissection at [facility] under the care of Dr. Williemae Natter with the final pathology showing residual disease with synchronous adenocarcinoma as well as squamous cell carcinoma in the right upper lobe and multiple mediastinal lymph node involvement performed on November 13, 2021. Unfortunately patient was found to have evidence for disease recurrence with involvement of 4R lymphadenopathy consistent with metastatic adenocarcinoma on February 26, 2022 biopsy. Molecular studies showed no actionable mutations and PD-L1 expression was 15% in the adenocarcinoma in 2% and the squamous cell carcinoma.  The patient has evidence for peripheral neuropathy from the previous chemotherapy and Dr. Julien Nordmann recommended for him concurrent chemotherapy with carboplatin for AUC of 5 and Alimta 500 Mg/M2 every 3 weeks for 2  cycles during the course of radiotherapy. He is status post 1 cycle of treatment and tolerated it well.    Labs were reviewed.  Reviewed neutropenic precautions.  We will check a UA today chest due to his concern with malodorous urine and increased urination  at night.  If this is negative for infection, discussed this may be secondary to BPH which he has a history of and to talk to his PCP about his Flomax and to try and stop drinking fluids late at night.  Should he develop any signs and symptoms of new infection, discussed with him that we would like to see him sooner for reevaluation.  I will arrange for him to have a repeat lab check next week.  We will see him back for a follow up visit in 2 weeks for evaluation and repeat blood work before starting cycle #2.   I reviewed the patient's brain MRI with Dr. Julien Nordmann.  The scan mentions some chronic microhemorrhages.  The patient is on 81 mg aspirin due to history of heart disease.  Dr. Julien Nordmann would recommend the patient follow-up with his cardiologist about whether he should continue this.    The patient was advised to call immediately if she has any concerning symptoms in the interval. The patient voices understanding of current disease status and treatment options and is in agreement with the current care plan. All questions were answered. The patient knows to call the clinic with any problems, questions or concerns. We can certainly see the patient much sooner if necessary    Orders Placed This Encounter  Procedures   Culture, Urine    Standing Status:   Future    Standing Expiration Date:   03/27/2023   Urinalysis, Complete w Microscopic    Standing Status:   Future    Standing Expiration Date:   03/27/2023   CBC with Differential ([place] Only)    Standing Status:   Standing    Number of Occurrences:   12    Standing Expiration Date:   03/27/2023   CMP ([place] only)    Standing Status:   Standing    Number of Occurrences:   12    Standing Expiration Date:   03/27/2023     The total time spent in the appointment was 20-29 minutes.   Oather Muilenburg L Keauna Brasel, PA-C 03/27/22

## 2022-03-25 ENCOUNTER — Other Ambulatory Visit: Payer: Self-pay

## 2022-03-25 ENCOUNTER — Ambulatory Visit
Admission: RE | Admit: 2022-03-25 | Discharge: 2022-03-25 | Disposition: A | Discharge status: Home / Self Care | Payer: Medicare Other | Source: Ambulatory Visit | Attending: Radiation Oncology | Admitting: Radiation Oncology

## 2022-03-25 DIAGNOSIS — I2510 Atherosclerotic heart disease of native coronary artery without angina pectoris: Secondary | ICD-10-CM | POA: Diagnosis not present

## 2022-03-25 DIAGNOSIS — I110 Hypertensive heart disease with heart failure: Secondary | ICD-10-CM | POA: Diagnosis not present

## 2022-03-25 DIAGNOSIS — Z87891 Personal history of nicotine dependence: Secondary | ICD-10-CM | POA: Diagnosis not present

## 2022-03-25 DIAGNOSIS — Z5111 Encounter for antineoplastic chemotherapy: Secondary | ICD-10-CM | POA: Diagnosis not present

## 2022-03-25 DIAGNOSIS — C3412 Malignant neoplasm of upper lobe, left bronchus or lung: Secondary | ICD-10-CM | POA: Diagnosis not present

## 2022-03-25 DIAGNOSIS — R3915 Urgency of urination: Secondary | ICD-10-CM | POA: Diagnosis not present

## 2022-03-25 DIAGNOSIS — C3411 Malignant neoplasm of upper lobe, right bronchus or lung: Secondary | ICD-10-CM | POA: Diagnosis not present

## 2022-03-25 DIAGNOSIS — Z510 Encounter for antineoplastic radiation therapy: Secondary | ICD-10-CM | POA: Diagnosis not present

## 2022-03-25 DIAGNOSIS — K5900 Constipation, unspecified: Secondary | ICD-10-CM | POA: Diagnosis not present

## 2022-03-25 DIAGNOSIS — Z806 Family history of leukemia: Secondary | ICD-10-CM | POA: Diagnosis not present

## 2022-03-25 DIAGNOSIS — G629 Polyneuropathy, unspecified: Secondary | ICD-10-CM | POA: Diagnosis not present

## 2022-03-25 DIAGNOSIS — R82998 Other abnormal findings in urine: Secondary | ICD-10-CM | POA: Diagnosis not present

## 2022-03-25 DIAGNOSIS — I509 Heart failure, unspecified: Secondary | ICD-10-CM | POA: Diagnosis not present

## 2022-03-25 DIAGNOSIS — Z803 Family history of malignant neoplasm of breast: Secondary | ICD-10-CM | POA: Diagnosis not present

## 2022-03-25 LAB — RAD ONC ARIA SESSION SUMMARY
Course Elapsed Days: 6
Plan Fractions Treated to Date: 5
Plan Prescribed Dose Per Fraction: 2 Gy
Plan Total Fractions Prescribed: 30
Plan Total Prescribed Dose: 60 Gy
Reference Point Dosage Given to Date: 10 Gy
Reference Point Session Dosage Given: 2 Gy
Session Number: 5

## 2022-03-26 ENCOUNTER — Other Ambulatory Visit: Payer: Self-pay | Admitting: Radiation Oncology

## 2022-03-26 ENCOUNTER — Other Ambulatory Visit: Payer: Self-pay

## 2022-03-26 ENCOUNTER — Other Ambulatory Visit: Payer: Self-pay | Admitting: Physician Assistant

## 2022-03-26 ENCOUNTER — Ambulatory Visit
Admission: RE | Admit: 2022-03-26 | Discharge: 2022-03-26 | Disposition: A | Discharge status: Home / Self Care | Payer: Medicare Other | Source: Ambulatory Visit | Attending: Radiation Oncology | Admitting: Radiation Oncology

## 2022-03-26 ENCOUNTER — Encounter (HOSPITAL_COMMUNITY)
Admission: RE | Admit: 2022-03-26 | Discharge: 2022-03-26 | Disposition: A | Discharge status: Home / Self Care | Payer: Medicare Other | Source: Ambulatory Visit | Attending: Thoracic Surgery (Cardiothoracic Vascular Surgery) | Admitting: Thoracic Surgery (Cardiothoracic Vascular Surgery)

## 2022-03-26 DIAGNOSIS — G629 Polyneuropathy, unspecified: Secondary | ICD-10-CM | POA: Diagnosis not present

## 2022-03-26 DIAGNOSIS — Z806 Family history of leukemia: Secondary | ICD-10-CM | POA: Diagnosis not present

## 2022-03-26 DIAGNOSIS — R82998 Other abnormal findings in urine: Secondary | ICD-10-CM | POA: Diagnosis not present

## 2022-03-26 DIAGNOSIS — C3491 Malignant neoplasm of unspecified part of right bronchus or lung: Secondary | ICD-10-CM

## 2022-03-26 DIAGNOSIS — Z510 Encounter for antineoplastic radiation therapy: Secondary | ICD-10-CM | POA: Diagnosis not present

## 2022-03-26 DIAGNOSIS — Z5111 Encounter for antineoplastic chemotherapy: Secondary | ICD-10-CM | POA: Diagnosis not present

## 2022-03-26 DIAGNOSIS — I509 Heart failure, unspecified: Secondary | ICD-10-CM | POA: Diagnosis not present

## 2022-03-26 DIAGNOSIS — I2510 Atherosclerotic heart disease of native coronary artery without angina pectoris: Secondary | ICD-10-CM | POA: Diagnosis not present

## 2022-03-26 DIAGNOSIS — C3411 Malignant neoplasm of upper lobe, right bronchus or lung: Secondary | ICD-10-CM

## 2022-03-26 DIAGNOSIS — I110 Hypertensive heart disease with heart failure: Secondary | ICD-10-CM | POA: Diagnosis not present

## 2022-03-26 DIAGNOSIS — K5900 Constipation, unspecified: Secondary | ICD-10-CM | POA: Diagnosis not present

## 2022-03-26 DIAGNOSIS — C3412 Malignant neoplasm of upper lobe, left bronchus or lung: Secondary | ICD-10-CM

## 2022-03-26 DIAGNOSIS — Z87891 Personal history of nicotine dependence: Secondary | ICD-10-CM | POA: Diagnosis not present

## 2022-03-26 DIAGNOSIS — R3915 Urgency of urination: Secondary | ICD-10-CM | POA: Diagnosis not present

## 2022-03-26 DIAGNOSIS — Z803 Family history of malignant neoplasm of breast: Secondary | ICD-10-CM | POA: Diagnosis not present

## 2022-03-26 LAB — RAD ONC ARIA SESSION SUMMARY
Course Elapsed Days: 7
Plan Fractions Treated to Date: 6
Plan Prescribed Dose Per Fraction: 2 Gy
Plan Total Fractions Prescribed: 30
Plan Total Prescribed Dose: 60 Gy
Reference Point Dosage Given to Date: 12 Gy
Reference Point Session Dosage Given: 0.4232 Gy
Session Number: 6

## 2022-03-26 MED ORDER — SONAFINE EX EMUL
1.0000 | Freq: Once | CUTANEOUS | Status: AC
  Administered 2022-03-26: 1 via TOPICAL

## 2022-03-26 MED ORDER — SUCRALFATE 1 G PO TABS: 1.0000 g | ORAL_TABLET | Freq: Three times a day (TID) | ORAL | 1 refills | Status: DC

## 2022-03-26 NOTE — Progress Notes (Signed)
Daily Session Note  Patient Details  Name: ISHMEL PELT MRN: [id number] Date of Birth: 1945/03/24 Referring Provider:   [place] from 12/27/2021 in [place]  Referring Provider Dr. Williemae Natter       Encounter Date: 03/26/2022  Check In:  Session Check In - 03/26/22 1328       Check-In   Supervising physician immediately available to respond to emergencies [facility] MD immediately available    Physician(s) Dr. Dellia Cloud    Staff Present Leana Roe, BS, Exercise Physiologist;Phyllis Billingsley, RN;[employer] [facility] Hassell Done, RN, BSN    Virtual Visit No    Medication changes reported     No    Fall or balance concerns reported    No    Tobacco Cessation No Change    Warm-up and Cool-down Performed as group-led instruction    Resistance Training Performed Yes      Pain Assessment   Currently in Pain? Yes    Pain Score 6     Pain Location Leg    Pain Orientation Right;Left    Pain Descriptors / Indicators Heaviness    Pain Type Chronic pain    Multiple Pain Sites No             Capillary Blood Glucose: No results found for this or any previous visit (from the past 24 hour(s)).    Social History   Tobacco Use  Smoking Status Former   Packs/day: .25   Types: Cigarettes   Quit date: 05/2021   Years since quitting: 0.8  Smokeless Tobacco Never    Goals Met:  Proper associated with RPD/PD & O2 Sat Independence with exercise equipment Using PLB without cueing & demonstrates good technique Exercise tolerated well Queuing for purse lip breathing No report of concerns or symptoms today Strength training completed today  Goals Unmet:  Not Applicable  Comments: Checkout at 1430.   Dr. Kathie Dike is Medical Director for [facility] Pulmonary Re[facility].

## 2022-03-26 NOTE — Progress Notes (Signed)
Pt tells navigator that he is tired, but overall is feeling well. No complaints at this time. Pt seens PA Cassie Heilingoetter tomorrow morning. Navigator will be available for the pt if needed, but there are no barriers at this time.

## 2022-03-27 ENCOUNTER — Inpatient Hospital Stay (HOSPITAL_BASED_OUTPATIENT_CLINIC_OR_DEPARTMENT_OTHER): Payer: Medicare Other | Admitting: Physician Assistant

## 2022-03-27 ENCOUNTER — Telehealth: Payer: Self-pay

## 2022-03-27 ENCOUNTER — Inpatient Hospital Stay: Payer: Medicare Other

## 2022-03-27 ENCOUNTER — Other Ambulatory Visit: Payer: Self-pay

## 2022-03-27 ENCOUNTER — Ambulatory Visit
Admission: RE | Admit: 2022-03-27 | Discharge: 2022-03-27 | Disposition: A | Discharge status: Home / Self Care | Payer: Medicare Other | Source: Ambulatory Visit | Attending: Radiation Oncology | Admitting: Radiation Oncology

## 2022-03-27 VITALS — BP 111/62 | HR 72 | Temp 97.8°F | Resp 17 | Ht 72.0 in | Wt 184.5 lb

## 2022-03-27 DIAGNOSIS — R3915 Urgency of urination: Secondary | ICD-10-CM | POA: Diagnosis not present

## 2022-03-27 DIAGNOSIS — Z803 Family history of malignant neoplasm of breast: Secondary | ICD-10-CM | POA: Diagnosis not present

## 2022-03-27 DIAGNOSIS — R82998 Other abnormal findings in urine: Secondary | ICD-10-CM | POA: Diagnosis not present

## 2022-03-27 DIAGNOSIS — R351 Nocturia: Secondary | ICD-10-CM

## 2022-03-27 DIAGNOSIS — Z87891 Personal history of nicotine dependence: Secondary | ICD-10-CM | POA: Diagnosis not present

## 2022-03-27 DIAGNOSIS — Z510 Encounter for antineoplastic radiation therapy: Secondary | ICD-10-CM | POA: Diagnosis not present

## 2022-03-27 DIAGNOSIS — I2510 Atherosclerotic heart disease of native coronary artery without angina pectoris: Secondary | ICD-10-CM | POA: Diagnosis not present

## 2022-03-27 DIAGNOSIS — C3412 Malignant neoplasm of upper lobe, left bronchus or lung: Secondary | ICD-10-CM | POA: Diagnosis not present

## 2022-03-27 DIAGNOSIS — G629 Polyneuropathy, unspecified: Secondary | ICD-10-CM | POA: Diagnosis not present

## 2022-03-27 DIAGNOSIS — Z5111 Encounter for antineoplastic chemotherapy: Secondary | ICD-10-CM | POA: Diagnosis not present

## 2022-03-27 DIAGNOSIS — I110 Hypertensive heart disease with heart failure: Secondary | ICD-10-CM | POA: Diagnosis not present

## 2022-03-27 DIAGNOSIS — I509 Heart failure, unspecified: Secondary | ICD-10-CM | POA: Diagnosis not present

## 2022-03-27 DIAGNOSIS — Z806 Family history of leukemia: Secondary | ICD-10-CM | POA: Diagnosis not present

## 2022-03-27 DIAGNOSIS — C3491 Malignant neoplasm of unspecified part of right bronchus or lung: Secondary | ICD-10-CM

## 2022-03-27 DIAGNOSIS — C3411 Malignant neoplasm of upper lobe, right bronchus or lung: Secondary | ICD-10-CM | POA: Diagnosis not present

## 2022-03-27 DIAGNOSIS — K5900 Constipation, unspecified: Secondary | ICD-10-CM | POA: Diagnosis not present

## 2022-03-27 LAB — CBC WITH DIFFERENTIAL (CANCER CENTER ONLY)
Abs Immature Granulocytes: 0.01 10*3/uL (ref 0.00–0.07)
Basophils Absolute: 0 10*3/uL (ref 0.0–0.1)
Basophils Relative: 0 %
Eosinophils Absolute: 0.2 10*3/uL (ref 0.0–0.5)
Eosinophils Relative: 6 %
HCT: 42.6 % (ref 39.0–52.0)
Hemoglobin: 14.3 g/dL (ref 13.0–17.0)
Immature Granulocytes: 0 %
Lymphocytes Relative: 22 %
Lymphs Abs: 0.6 10*3/uL — ABNORMAL LOW (ref 0.7–4.0)
MCH: 31.1 pg (ref 26.0–34.0)
MCHC: 33.6 g/dL (ref 30.0–36.0)
MCV: 92.6 fL (ref 80.0–100.0)
Monocytes Absolute: 0.5 10*3/uL (ref 0.1–1.0)
Monocytes Relative: 18 %
Neutro Abs: 1.4 10*3/uL — ABNORMAL LOW (ref 1.7–7.7)
Neutrophils Relative %: 54 %
Platelet Count: 132 10*3/uL — ABNORMAL LOW (ref 150–400)
RBC: 4.6 MIL/uL (ref 4.22–5.81)
RDW: 13.2 % (ref 11.5–15.5)
WBC Count: 2.7 10*3/uL — ABNORMAL LOW (ref 4.0–10.5)
nRBC: 0 % (ref 0.0–0.2)

## 2022-03-27 LAB — URINALYSIS, COMPLETE (UACMP) WITH MICROSCOPIC
Bacteria, UA: NONE SEEN
Bilirubin Urine: NEGATIVE
Glucose, UA: NEGATIVE mg/dL
Hgb urine dipstick: NEGATIVE
Ketones, ur: NEGATIVE mg/dL
Leukocytes,Ua: NEGATIVE
Nitrite: NEGATIVE
Protein, ur: NEGATIVE mg/dL
Specific Gravity, Urine: 1.004 — ABNORMAL LOW (ref 1.005–1.030)
pH: 7 (ref 5.0–8.0)

## 2022-03-27 LAB — RAD ONC ARIA SESSION SUMMARY
Course Elapsed Days: 8
Plan Fractions Treated to Date: 7
Plan Prescribed Dose Per Fraction: 2 Gy
Plan Total Fractions Prescribed: 30
Plan Total Prescribed Dose: 60 Gy
Reference Point Dosage Given to Date: 14 Gy
Reference Point Session Dosage Given: 2 Gy
Session Number: 7

## 2022-03-27 LAB — CMP (CANCER CENTER ONLY)
ALT: 13 U/L (ref 0–44)
AST: 13 U/L — ABNORMAL LOW (ref 15–41)
Albumin: 4 g/dL (ref 3.5–5.0)
Alkaline Phosphatase: 65 U/L (ref 38–126)
Anion gap: 4 — ABNORMAL LOW (ref 5–15)
BUN: 16 mg/dL (ref 8–23)
CO2: 30 mmol/L (ref 22–32)
Calcium: 9.1 mg/dL (ref 8.9–10.3)
Chloride: 103 mmol/L (ref 98–111)
Creatinine: 0.99 mg/dL (ref 0.61–1.24)
GFR, Estimated: >60 mL/min (ref >60)
Glucose, Bld: 96 mg/dL (ref 70–99)
Potassium: 4.6 mmol/L (ref 3.5–5.1)
Sodium: 137 mmol/L (ref 135–145)
Total Bilirubin: 0.7 mg/dL (ref 0.3–1.2)
Total Protein: 7 g/dL (ref 6.5–8.1)

## 2022-03-27 NOTE — Telephone Encounter (Signed)
This nurse reached out to patient and informed of negative urinalysis.  Advised per provider that he should follow up with his urologists per their conversation from the office visit today.  Patient acknowledged understanding.  No questions or concerns at this time.

## 2022-03-27 NOTE — Telephone Encounter (Signed)
-----   Message from [employer], PA-C sent at 03/27/2022 10:39 AM EDT ----- Can you call him and let him know his urine does not appear to show any infection. Sx may be related to BPH ----- Message ----- From: Interface, Lab In [place] Sent: 03/27/2022   8:41 AM EDT To: Tobe Sos Heilingoetter, PA-C

## 2022-03-28 ENCOUNTER — Other Ambulatory Visit: Payer: Self-pay

## 2022-03-28 ENCOUNTER — Ambulatory Visit
Admission: RE | Admit: 2022-03-28 | Discharge: 2022-03-28 | Disposition: A | Discharge status: Home / Self Care | Payer: Medicare Other | Source: Ambulatory Visit | Attending: Radiation Oncology | Admitting: Radiation Oncology

## 2022-03-28 ENCOUNTER — Encounter (HOSPITAL_COMMUNITY)
Admission: RE | Admit: 2022-03-28 | Discharge: 2022-03-28 | Disposition: A | Discharge status: Home / Self Care | Payer: Medicare Other | Source: Ambulatory Visit | Attending: Thoracic Surgery (Cardiothoracic Vascular Surgery) | Admitting: Thoracic Surgery (Cardiothoracic Vascular Surgery)

## 2022-03-28 DIAGNOSIS — R82998 Other abnormal findings in urine: Secondary | ICD-10-CM | POA: Diagnosis not present

## 2022-03-28 DIAGNOSIS — C3412 Malignant neoplasm of upper lobe, left bronchus or lung: Secondary | ICD-10-CM | POA: Diagnosis not present

## 2022-03-28 DIAGNOSIS — Z5111 Encounter for antineoplastic chemotherapy: Secondary | ICD-10-CM | POA: Diagnosis not present

## 2022-03-28 DIAGNOSIS — C3411 Malignant neoplasm of upper lobe, right bronchus or lung: Secondary | ICD-10-CM | POA: Diagnosis not present

## 2022-03-28 DIAGNOSIS — Z806 Family history of leukemia: Secondary | ICD-10-CM | POA: Diagnosis not present

## 2022-03-28 DIAGNOSIS — G629 Polyneuropathy, unspecified: Secondary | ICD-10-CM | POA: Diagnosis not present

## 2022-03-28 DIAGNOSIS — Z510 Encounter for antineoplastic radiation therapy: Secondary | ICD-10-CM | POA: Diagnosis not present

## 2022-03-28 DIAGNOSIS — I110 Hypertensive heart disease with heart failure: Secondary | ICD-10-CM | POA: Diagnosis not present

## 2022-03-28 DIAGNOSIS — K5900 Constipation, unspecified: Secondary | ICD-10-CM | POA: Diagnosis not present

## 2022-03-28 DIAGNOSIS — Z803 Family history of malignant neoplasm of breast: Secondary | ICD-10-CM | POA: Diagnosis not present

## 2022-03-28 DIAGNOSIS — Z87891 Personal history of nicotine dependence: Secondary | ICD-10-CM | POA: Diagnosis not present

## 2022-03-28 DIAGNOSIS — I2510 Atherosclerotic heart disease of native coronary artery without angina pectoris: Secondary | ICD-10-CM | POA: Diagnosis not present

## 2022-03-28 DIAGNOSIS — R3915 Urgency of urination: Secondary | ICD-10-CM | POA: Diagnosis not present

## 2022-03-28 DIAGNOSIS — I509 Heart failure, unspecified: Secondary | ICD-10-CM | POA: Diagnosis not present

## 2022-03-28 LAB — RAD ONC ARIA SESSION SUMMARY
Course Elapsed Days: 9
Plan Fractions Treated to Date: 8
Plan Prescribed Dose Per Fraction: 2 Gy
Plan Total Fractions Prescribed: 30
Plan Total Prescribed Dose: 60 Gy
Reference Point Dosage Given to Date: 16 Gy
Reference Point Session Dosage Given: 2 Gy
Session Number: 8

## 2022-03-28 LAB — URINE CULTURE: Culture: <10000 — AB

## 2022-03-28 NOTE — Progress Notes (Signed)
Daily Session Note  Patient Details  Name: NYHEEM MADURO MRN: [id number] Date of Birth: 1945-07-12 Referring Provider:   [place] from 12/27/2021 in [place]  Referring Provider Dr. Williemae Natter       Encounter Date: 03/28/2022  Check In:  Session Check In - 03/28/22 1328       Check-In   Supervising physician immediately available to respond to emergencies [facility] MD immediately available    Physician(s) Dr Harl Bowie    Location [facility]    Staff Present Leana Roe, BS, Exercise Physiologist;Debra Wynetta Emery, RN, Joanette Gula, RN, BSN    Virtual Visit No    Medication changes reported     No    Fall or balance concerns reported    No    Tobacco Cessation No Change    Warm-up and Cool-down Performed as group-led instruction    Resistance Training Performed Yes    VAD Patient? No    PAD/SET Patient? No      Pain Assessment   Currently in Pain? No/denies    Pain Score 0-No pain    Multiple Pain Sites No             Capillary Blood Glucose: No results found for this or any previous visit (from the past 24 hour(s)).    Social History   Tobacco Use  Smoking Status Former   Packs/day: .25   Types: Cigarettes   Quit date: 05/2021   Years since quitting: 0.8  Smokeless Tobacco Never    Goals Met:  Proper associated with RPD/PD & O2 Sat Independence with exercise equipment Using PLB without cueing & demonstrates good technique Exercise tolerated well Queuing for purse lip breathing No report of concerns or symptoms today Strength training completed today  Goals Unmet:  Not Applicable  Comments: Checkout at 1430.    Dr. Kathie Dike is Medical Director for [facility] Pulmonary Rehab.

## 2022-03-29 ENCOUNTER — Ambulatory Visit
Admission: RE | Admit: 2022-03-29 | Discharge: 2022-03-29 | Disposition: A | Discharge status: Home / Self Care | Payer: Medicare Other | Source: Ambulatory Visit | Attending: Radiation Oncology | Admitting: Radiation Oncology

## 2022-03-29 ENCOUNTER — Other Ambulatory Visit: Payer: Self-pay

## 2022-03-29 DIAGNOSIS — C3412 Malignant neoplasm of upper lobe, left bronchus or lung: Secondary | ICD-10-CM | POA: Diagnosis not present

## 2022-03-29 DIAGNOSIS — I509 Heart failure, unspecified: Secondary | ICD-10-CM | POA: Diagnosis not present

## 2022-03-29 DIAGNOSIS — Z510 Encounter for antineoplastic radiation therapy: Secondary | ICD-10-CM | POA: Diagnosis not present

## 2022-03-29 DIAGNOSIS — G629 Polyneuropathy, unspecified: Secondary | ICD-10-CM | POA: Diagnosis not present

## 2022-03-29 DIAGNOSIS — Z806 Family history of leukemia: Secondary | ICD-10-CM | POA: Diagnosis not present

## 2022-03-29 DIAGNOSIS — I2510 Atherosclerotic heart disease of native coronary artery without angina pectoris: Secondary | ICD-10-CM | POA: Diagnosis not present

## 2022-03-29 DIAGNOSIS — C3411 Malignant neoplasm of upper lobe, right bronchus or lung: Secondary | ICD-10-CM | POA: Diagnosis not present

## 2022-03-29 DIAGNOSIS — R82998 Other abnormal findings in urine: Secondary | ICD-10-CM | POA: Diagnosis not present

## 2022-03-29 DIAGNOSIS — Z5111 Encounter for antineoplastic chemotherapy: Secondary | ICD-10-CM | POA: Diagnosis not present

## 2022-03-29 DIAGNOSIS — K5900 Constipation, unspecified: Secondary | ICD-10-CM | POA: Diagnosis not present

## 2022-03-29 DIAGNOSIS — Z87891 Personal history of nicotine dependence: Secondary | ICD-10-CM | POA: Diagnosis not present

## 2022-03-29 DIAGNOSIS — I110 Hypertensive heart disease with heart failure: Secondary | ICD-10-CM | POA: Diagnosis not present

## 2022-03-29 DIAGNOSIS — Z803 Family history of malignant neoplasm of breast: Secondary | ICD-10-CM | POA: Diagnosis not present

## 2022-03-29 DIAGNOSIS — R3915 Urgency of urination: Secondary | ICD-10-CM | POA: Diagnosis not present

## 2022-03-29 LAB — RAD ONC ARIA SESSION SUMMARY
Course Elapsed Days: 10
Plan Fractions Treated to Date: 9
Plan Prescribed Dose Per Fraction: 2 Gy
Plan Total Fractions Prescribed: 30
Plan Total Prescribed Dose: 60 Gy
Reference Point Dosage Given to Date: 18 Gy
Reference Point Session Dosage Given: 2 Gy
Session Number: 9

## 2022-04-01 ENCOUNTER — Encounter: Payer: Self-pay | Admitting: General Practice

## 2022-04-01 ENCOUNTER — Ambulatory Visit
Admission: RE | Admit: 2022-04-01 | Discharge: 2022-04-01 | Disposition: A | Discharge status: Home / Self Care | Payer: Medicare Other | Source: Ambulatory Visit | Attending: Radiation Oncology | Admitting: Radiation Oncology

## 2022-04-01 ENCOUNTER — Other Ambulatory Visit: Payer: Self-pay

## 2022-04-01 DIAGNOSIS — G629 Polyneuropathy, unspecified: Secondary | ICD-10-CM | POA: Diagnosis not present

## 2022-04-01 DIAGNOSIS — Z803 Family history of malignant neoplasm of breast: Secondary | ICD-10-CM | POA: Diagnosis not present

## 2022-04-01 DIAGNOSIS — I110 Hypertensive heart disease with heart failure: Secondary | ICD-10-CM | POA: Diagnosis not present

## 2022-04-01 DIAGNOSIS — R82998 Other abnormal findings in urine: Secondary | ICD-10-CM | POA: Diagnosis not present

## 2022-04-01 DIAGNOSIS — Z806 Family history of leukemia: Secondary | ICD-10-CM | POA: Diagnosis not present

## 2022-04-01 DIAGNOSIS — K5900 Constipation, unspecified: Secondary | ICD-10-CM | POA: Diagnosis not present

## 2022-04-01 DIAGNOSIS — R3915 Urgency of urination: Secondary | ICD-10-CM | POA: Diagnosis not present

## 2022-04-01 DIAGNOSIS — C3412 Malignant neoplasm of upper lobe, left bronchus or lung: Secondary | ICD-10-CM | POA: Diagnosis not present

## 2022-04-01 DIAGNOSIS — Z510 Encounter for antineoplastic radiation therapy: Secondary | ICD-10-CM | POA: Diagnosis not present

## 2022-04-01 DIAGNOSIS — I509 Heart failure, unspecified: Secondary | ICD-10-CM | POA: Diagnosis not present

## 2022-04-01 DIAGNOSIS — Z5111 Encounter for antineoplastic chemotherapy: Secondary | ICD-10-CM | POA: Diagnosis not present

## 2022-04-01 DIAGNOSIS — I2510 Atherosclerotic heart disease of native coronary artery without angina pectoris: Secondary | ICD-10-CM | POA: Diagnosis not present

## 2022-04-01 DIAGNOSIS — C3411 Malignant neoplasm of upper lobe, right bronchus or lung: Secondary | ICD-10-CM | POA: Diagnosis not present

## 2022-04-01 DIAGNOSIS — Z87891 Personal history of nicotine dependence: Secondary | ICD-10-CM | POA: Diagnosis not present

## 2022-04-01 LAB — RAD ONC ARIA SESSION SUMMARY
Course Elapsed Days: 13
Plan Fractions Treated to Date: 10
Plan Prescribed Dose Per Fraction: 2 Gy
Plan Total Fractions Prescribed: 30
Plan Total Prescribed Dose: 60 Gy
Reference Point Dosage Given to Date: 20 Gy
Reference Point Session Dosage Given: 2 Gy
Session Number: 10

## 2022-04-01 NOTE — Progress Notes (Signed)
[facility] Spiritual Care Note  Had planned to follow up with Patrick Harris in infusion on 4/1, but will miss him due to a scheduling conflict. Phoned to make a pastoral check-in. He reports doing well at this time, but knows to phone chaplain if needs arise or circumstances change.   [place], [place], [facility] Pager [phone] Voicemail [phone]

## 2022-04-02 ENCOUNTER — Ambulatory Visit
Admission: RE | Admit: 2022-04-02 | Discharge: 2022-04-02 | Disposition: A | Discharge status: Home / Self Care | Payer: Medicare Other | Source: Ambulatory Visit | Attending: Radiation Oncology | Admitting: Radiation Oncology

## 2022-04-02 ENCOUNTER — Encounter (HOSPITAL_COMMUNITY)
Admission: RE | Admit: 2022-04-02 | Discharge: 2022-04-02 | Disposition: A | Discharge status: Home / Self Care | Payer: Medicare Other | Source: Ambulatory Visit | Attending: Thoracic Surgery (Cardiothoracic Vascular Surgery) | Admitting: Thoracic Surgery (Cardiothoracic Vascular Surgery)

## 2022-04-02 ENCOUNTER — Other Ambulatory Visit: Payer: Self-pay

## 2022-04-02 VITALS — Wt 184.5 lb

## 2022-04-02 DIAGNOSIS — Z510 Encounter for antineoplastic radiation therapy: Secondary | ICD-10-CM | POA: Diagnosis not present

## 2022-04-02 DIAGNOSIS — Z5111 Encounter for antineoplastic chemotherapy: Secondary | ICD-10-CM | POA: Diagnosis not present

## 2022-04-02 DIAGNOSIS — R82998 Other abnormal findings in urine: Secondary | ICD-10-CM | POA: Diagnosis not present

## 2022-04-02 DIAGNOSIS — Z803 Family history of malignant neoplasm of breast: Secondary | ICD-10-CM | POA: Diagnosis not present

## 2022-04-02 DIAGNOSIS — I2510 Atherosclerotic heart disease of native coronary artery without angina pectoris: Secondary | ICD-10-CM | POA: Diagnosis not present

## 2022-04-02 DIAGNOSIS — C3412 Malignant neoplasm of upper lobe, left bronchus or lung: Secondary | ICD-10-CM | POA: Diagnosis not present

## 2022-04-02 DIAGNOSIS — C3411 Malignant neoplasm of upper lobe, right bronchus or lung: Secondary | ICD-10-CM

## 2022-04-02 DIAGNOSIS — Z87891 Personal history of nicotine dependence: Secondary | ICD-10-CM | POA: Diagnosis not present

## 2022-04-02 DIAGNOSIS — R3915 Urgency of urination: Secondary | ICD-10-CM | POA: Diagnosis not present

## 2022-04-02 DIAGNOSIS — K5900 Constipation, unspecified: Secondary | ICD-10-CM | POA: Diagnosis not present

## 2022-04-02 DIAGNOSIS — Z806 Family history of leukemia: Secondary | ICD-10-CM | POA: Diagnosis not present

## 2022-04-02 DIAGNOSIS — I509 Heart failure, unspecified: Secondary | ICD-10-CM | POA: Diagnosis not present

## 2022-04-02 DIAGNOSIS — I110 Hypertensive heart disease with heart failure: Secondary | ICD-10-CM | POA: Diagnosis not present

## 2022-04-02 DIAGNOSIS — G629 Polyneuropathy, unspecified: Secondary | ICD-10-CM | POA: Diagnosis not present

## 2022-04-02 LAB — RAD ONC ARIA SESSION SUMMARY
Course Elapsed Days: 14
Plan Fractions Treated to Date: 11
Plan Prescribed Dose Per Fraction: 2 Gy
Plan Total Fractions Prescribed: 30
Plan Total Prescribed Dose: 60 Gy
Reference Point Dosage Given to Date: 22 Gy
Reference Point Session Dosage Given: 2 Gy
Session Number: 11

## 2022-04-02 NOTE — Progress Notes (Signed)
Daily Session Note  Patient Details  Name: Patrick Harris MRN: [id number] Date of Birth: June 17, 1945 Referring Provider:   [place] from 12/27/2021 in [place]  Referring Provider Dr. Williemae Natter       Encounter Date: 04/02/2022  Check In:  Session Check In - 04/02/22 1330       Check-In   Supervising physician immediately available to respond to emergencies [facility] MD immediately available    Physician(s) Dr. Domenic Polite    Location [facility]    Staff Present Leana Roe, BS, Exercise Physiologist;Daphyne Hassell Done, RN, BSN;Latoi Giraldo, RN;Dalton Sherrie George, MS, ACSM-CEP    Virtual Visit No    Medication changes reported     No    Fall or balance concerns reported    No    Tobacco Cessation No Change    Warm-up and Cool-down Performed as group-led instruction    Resistance Training Performed Yes    VAD Patient? No    PAD/SET Patient? No      Pain Assessment   Currently in Pain? No/denies    Pain Score 0-No pain    Multiple Pain Sites No             Capillary Blood Glucose: No results found for this or any previous visit (from the past 24 hour(s)).    Social History   Tobacco Use  Smoking Status Former   Packs/day: .25   Types: Cigarettes   Quit date: 05/2021   Years since quitting: 0.9  Smokeless Tobacco Never    Goals Met:  Proper associated with RPD/PD & O2 Sat Independence with exercise equipment Using PLB without cueing & demonstrates good technique Exercise tolerated well No report of concerns or symptoms today Strength training completed today  Goals Unmet:  Not Applicable  Comments: Check out @ 2:30pm   Dr. Kathie Dike is Medical Director for [facility].

## 2022-04-03 ENCOUNTER — Ambulatory Visit
Admission: RE | Admit: 2022-04-03 | Discharge: 2022-04-03 | Disposition: A | Discharge status: Home / Self Care | Payer: Medicare Other | Source: Ambulatory Visit | Attending: Radiation Oncology | Admitting: Radiation Oncology

## 2022-04-03 ENCOUNTER — Inpatient Hospital Stay: Payer: Medicare Other

## 2022-04-03 ENCOUNTER — Other Ambulatory Visit: Payer: Self-pay

## 2022-04-03 DIAGNOSIS — C3491 Malignant neoplasm of unspecified part of right bronchus or lung: Secondary | ICD-10-CM

## 2022-04-03 DIAGNOSIS — Z510 Encounter for antineoplastic radiation therapy: Secondary | ICD-10-CM | POA: Diagnosis not present

## 2022-04-03 DIAGNOSIS — K5900 Constipation, unspecified: Secondary | ICD-10-CM | POA: Diagnosis not present

## 2022-04-03 DIAGNOSIS — Z803 Family history of malignant neoplasm of breast: Secondary | ICD-10-CM | POA: Diagnosis not present

## 2022-04-03 DIAGNOSIS — Z806 Family history of leukemia: Secondary | ICD-10-CM | POA: Diagnosis not present

## 2022-04-03 DIAGNOSIS — Z5111 Encounter for antineoplastic chemotherapy: Secondary | ICD-10-CM | POA: Diagnosis not present

## 2022-04-03 DIAGNOSIS — G629 Polyneuropathy, unspecified: Secondary | ICD-10-CM | POA: Diagnosis not present

## 2022-04-03 DIAGNOSIS — Z87891 Personal history of nicotine dependence: Secondary | ICD-10-CM | POA: Diagnosis not present

## 2022-04-03 DIAGNOSIS — R3915 Urgency of urination: Secondary | ICD-10-CM | POA: Diagnosis not present

## 2022-04-03 DIAGNOSIS — R82998 Other abnormal findings in urine: Secondary | ICD-10-CM | POA: Diagnosis not present

## 2022-04-03 DIAGNOSIS — C3411 Malignant neoplasm of upper lobe, right bronchus or lung: Secondary | ICD-10-CM | POA: Diagnosis not present

## 2022-04-03 DIAGNOSIS — I509 Heart failure, unspecified: Secondary | ICD-10-CM | POA: Diagnosis not present

## 2022-04-03 DIAGNOSIS — I2510 Atherosclerotic heart disease of native coronary artery without angina pectoris: Secondary | ICD-10-CM | POA: Diagnosis not present

## 2022-04-03 DIAGNOSIS — I110 Hypertensive heart disease with heart failure: Secondary | ICD-10-CM | POA: Diagnosis not present

## 2022-04-03 DIAGNOSIS — C3412 Malignant neoplasm of upper lobe, left bronchus or lung: Secondary | ICD-10-CM | POA: Diagnosis not present

## 2022-04-03 LAB — RAD ONC ARIA SESSION SUMMARY
Course Elapsed Days: 15
Plan Fractions Treated to Date: 12
Plan Prescribed Dose Per Fraction: 2 Gy
Plan Total Fractions Prescribed: 30
Plan Total Prescribed Dose: 60 Gy
Reference Point Dosage Given to Date: 24 Gy
Reference Point Session Dosage Given: 2 Gy
Session Number: 12

## 2022-04-03 LAB — CBC WITH DIFFERENTIAL (CANCER CENTER ONLY)
Abs Immature Granulocytes: 0.01 10*3/uL (ref 0.00–0.07)
Basophils Absolute: 0 10*3/uL (ref 0.0–0.1)
Basophils Relative: 0 %
Eosinophils Absolute: 0.1 10*3/uL (ref 0.0–0.5)
Eosinophils Relative: 3 %
HCT: 39.6 % (ref 39.0–52.0)
Hemoglobin: 13.3 g/dL (ref 13.0–17.0)
Immature Granulocytes: 0 %
Lymphocytes Relative: 14 %
Lymphs Abs: 0.4 10*3/uL — ABNORMAL LOW (ref 0.7–4.0)
MCH: 30.6 pg (ref 26.0–34.0)
MCHC: 33.6 g/dL (ref 30.0–36.0)
MCV: 91 fL (ref 80.0–100.0)
Monocytes Absolute: 0.5 10*3/uL (ref 0.1–1.0)
Monocytes Relative: 16 %
Neutro Abs: 1.9 10*3/uL (ref 1.7–7.7)
Neutrophils Relative %: 67 %
Platelet Count: 56 10*3/uL — ABNORMAL LOW (ref 150–400)
RBC: 4.35 MIL/uL (ref 4.22–5.81)
RDW: 13.2 % (ref 11.5–15.5)
WBC Count: 2.8 10*3/uL — ABNORMAL LOW (ref 4.0–10.5)
nRBC: 0 % (ref 0.0–0.2)

## 2022-04-03 LAB — CMP (CANCER CENTER ONLY)
ALT: 15 U/L (ref 0–44)
AST: 15 U/L (ref 15–41)
Albumin: 3.8 g/dL (ref 3.5–5.0)
Alkaline Phosphatase: 60 U/L (ref 38–126)
Anion gap: 4 — ABNORMAL LOW (ref 5–15)
BUN: 17 mg/dL (ref 8–23)
CO2: 29 mmol/L (ref 22–32)
Calcium: 9.2 mg/dL (ref 8.9–10.3)
Chloride: 104 mmol/L (ref 98–111)
Creatinine: 0.95 mg/dL (ref 0.61–1.24)
GFR, Estimated: >60 mL/min (ref >60)
Glucose, Bld: 113 mg/dL — ABNORMAL HIGH (ref 70–99)
Potassium: 4.2 mmol/L (ref 3.5–5.1)
Sodium: 137 mmol/L (ref 135–145)
Total Bilirubin: 0.5 mg/dL (ref 0.3–1.2)
Total Protein: 6.9 g/dL (ref 6.5–8.1)

## 2022-04-03 NOTE — Progress Notes (Signed)
Pulmonary Individual Treatment Plan  Patient Details  Name: Patrick Harris MRN: [id number] Date of Birth: [birth year] Referring Provider:   [place] from 12/27/2021 in [place]  Referring Provider Dr. Williemae Natter       Initial Encounter Date:  Flowsheet Row PULMONARY REHAB OTHER RESP ORIENTATION from 12/27/2021 in [place]  Date 12/27/21       Visit Diagnosis: Malignant neoplasm of right upper lobe of lung ([place])  Malignant neoplasm of upper lobe of left lung ([place])  Patient's Home Medications on Admission:   Current Outpatient Medications:    acetaminophen (TYLENOL) 500 MG tablet, Take 500 mg by mouth every 6 (six) hours as needed (for pain.)., Disp: , Rfl:    aspirin EC 81 MG tablet, Take 1 tablet (81 mg total) by mouth at bedtime. Okay to restart this medication on 07/03/2021, Disp: 30 tablet, Rfl: 11   Cholecalciferol (VITAMIN D3) 2000 UNITS TABS, Take 2,000 Units by mouth in the morning and at bedtime., Disp: , Rfl:    Coenzyme Q10 (COQ-10) 100 MG CAPS, Take 100 mg by mouth in the morning., Disp: , Rfl:    Cyanocobalamin (B-12 PO), Take 1 tablet by mouth daily., Disp: , Rfl:    dexamethasone (DECADRON) 4 MG tablet, 1 tab p.o. twice daily the day before, day of and day after chemotherapy., Disp: 20 tablet, Rfl: 0   folic acid (FOLVITE) 1 MG tablet, Take 1 tablet (1 mg total) by mouth daily., Disp: 30 tablet, Rfl: 1   hydrALAZINE (APRESOLINE) 25 MG tablet, Take 1 tablet (25 mg total) by mouth 3 (three) times daily., Disp: 270 tablet, Rfl: 3   irbesartan (AVAPRO) 300 MG tablet, Take 1 tablet (300 mg total) by mouth daily., Disp: 90 tablet, Rfl: 3   nitroGLYCERIN (NITROSTAT) 0.4 MG SL tablet, Place 1 tablet (0.4 mg total) under the tongue every 5 (five) minutes x 3 doses as needed for chest pain (if no relief after 2nd dose, proceed to ED or call 911)., Disp: 25 tablet, Rfl: 3   Omega-3 Fatty  Acids (OMEGA-3 FISH OIL PO), Take 1,000 mg by mouth in the morning., Disp: , Rfl:    omeprazole (PRILOSEC) 40 MG capsule, Take 1 capsule (40 mg total) by mouth in the morning., Disp: , Rfl:    ondansetron (ZOFRAN) 8 MG tablet, Take 1 tablet (8 mg total) by mouth every 8 (eight) hours as needed for vomiting or nausea., Disp: 20 tablet, Rfl: 0   Polyethyl Glycol-Propyl Glycol (LUBRICANT EYE DROPS) 0.4-0.3 % SOLN, Place 1-2 drops into both eyes 3 (three) times daily as needed (dry/irritated eyes.)., Disp: , Rfl:    prochlorperazine (COMPAZINE) 10 MG tablet, Take 1 tablet (10 mg total) by mouth every 6 (six) hours as needed for nausea or vomiting., Disp: 30 tablet, Rfl: 0   rosuvastatin (CRESTOR) 20 MG tablet, TAKE HALF A TABLET (10 MG) BY MOUTH ONCE DAILY., Disp: 45 tablet, Rfl: 3   sucralfate (CARAFATE) 1 g tablet, Take 1 tablet (1 g total) by mouth 4 (four) times daily -  with meals and at bedtime. Crush and dissolve in 10 mL warm water prior to swallowing, take 20 min before meals, Disp: 120 tablet, Rfl: 1   tadalafil (CIALIS) 5 MG tablet, Take 5 mg by mouth daily as needed for erectile dysfunction., Disp: , Rfl:    tamsulosin (FLOMAX) 0.4 MG CAPS capsule, Take 0.4 mg by mouth in the morning., Disp: , Rfl:  Testosterone 20.25 MG/ACT (1.62%) GEL, Apply 2 Pump topically in the morning., Disp: , Rfl:    vitamin C (ASCORBIC ACID) 500 MG tablet, Take 500 mg by mouth in the morning., Disp: , Rfl:   Past Medical History: Past Medical History:  Diagnosis Date   Arthritis    CAD (coronary artery disease)    CABG with LIMA to LAD and RIMA to RCA August 2000 - Dr. Roxan Hockey   Cancer [facility])    basal cell   CHF (congestive heart failure) (HCC)    COPD (chronic obstructive pulmonary disease) (HCC)    Elevated glycated hemoglobin    Essential hypertension    GERD (gastroesophageal reflux disease)    Heart murmur    Hyperlipidemia    Hypopituitarism ([place])    Lung cancer ([place])    Osteopenia    Dr  Wilson Singer   Pancreatitis    Peyronie disease    Testosterone deficiency    Dr Wilson Singer    Tobacco Use: Social History   Tobacco Use  Smoking Status Former   Packs/day: .25   Types: Cigarettes   Quit date: 05/2021   Years since quitting: 0.9  Smokeless Tobacco Never    Labs: Review Flowsheet  More data exists      Latest Ref Rng & Units 08/22/2017 12/23/2017 08/24/2018 08/25/2019 08/29/2020  Labs for ITP Cardiac and Pulmonary Rehab  Cholestrol 0 - 200 mg/dL 128  137  146  122  118   LDL (calc) 0 - 99 mg/dL 71  81  86  71  65   HDL-C >39.00 mg/dL 29.10  36  34.20  33  33.60   Trlycerides 0.0 - 149.0 mg/dL 140.0  101  130.0  98  96.0   Hemoglobin A1c 4.6 - 6.5 % 6.2  5.9  6.1  5.5  6.0     Capillary Blood Glucose: Lab Results  Component Value Date   GLUCAP 117 (H) 07/23/2021     Pulmonary Assessment Scores:  Pulmonary Assessment Scores     Row Name 12/27/21 1312         ADL UCSD   ADL Phase Entry     SOB Score total 69     Rest 2     Walk 3     Stairs 4     Bath 3     Dress 3     Shop 3       CAT Score   CAT Score 23       mMRC Score   mMRC Score 3             UCSD: Self-administered rating of dyspnea associated with activities of daily living (ADLs) 6-point scale (0 = "not at all" to 5 = "maximal or unable to do because of breathlessness")  Scoring Scores range from 0 to 120.  Minimally important difference is 5 units  CAT: CAT can identify the health impairment of COPD patients and is better correlated with disease progression.  CAT has a scoring range of zero to 40. The CAT score is classified into four groups of low (less than 10), medium (10 - 20), high (21-30) and very high (31-40) based on the impact level of disease on health status. A CAT score over 10 suggests significant symptoms.  A worsening CAT score could be explained by an exacerbation, poor medication adherence, poor inhaler technique, or progression of COPD or comorbid conditions.  CAT  MCID is 2 points  mMRC: mMRC (Modified Medical  Research Council) Dyspnea Scale is used to assess the degree of baseline functional disability in patients of respiratory disease due to dyspnea. No minimal important difference is established. A decrease in score of 1 point or greater is considered a positive change.   Pulmonary Function Assessment:   Exercise Target Goals: Exercise Program Goal: Individual exercise prescription set using results from initial 6 min walk test and THRR while considering  patient's activity barriers and safety.   Exercise Prescription Goal: Initial exercise prescription builds to 30-45 minutes a day of aerobic activity, 2-3 days per week.  Home exercise guidelines will be given to patient during program as part of exercise prescription that the participant will acknowledge.  Activity Barriers & Risk Stratification:  Activity Barriers & Cardiac Risk Stratification - 12/27/21 1250       Activity Barriers & Cardiac Risk Stratification   Activity Barriers Shortness of Breath;Deconditioning;Neck/Spine Problems    Cardiac Risk Stratification High             6 Minute Walk:  6 Minute Walk     Row Name 12/27/21 1331         6 Minute Walk   Phase Initial     Distance 1200 feet     Walk Time 6 minutes     # of Rest Breaks 0     MPH 2.27     METS 2.88     RPE 12     Perceived Dyspnea  12     VO2 Peak 10.09     Symptoms No     Resting HR 80 bpm     Resting BP 110/60     Resting Oxygen Saturation  96 %     Exercise Oxygen Saturation  during 6 min walk 93 %     Max Ex. HR 121 bpm     Max Ex. BP 130/60     2 Minute Post BP 120/60       Interval HR   1 Minute HR 114     2 Minute HR 105     3 Minute HR 110     4 Minute HR 105     5 Minute HR 104     6 Minute HR 121     2 Minute Post HR 80     Interval Heart Rate? Yes       Interval Oxygen   Interval Oxygen? Yes     Baseline Oxygen Saturation % 96 %     1 Minute Oxygen Saturation % 96 %      1 Minute Liters of Oxygen 0 L     2 Minute Oxygen Saturation % 94 %     2 Minute Liters of Oxygen 0 L     3 Minute Oxygen Saturation % 95 %     3 Minute Liters of Oxygen 0 L     4 Minute Oxygen Saturation % 93 %     4 Minute Liters of Oxygen 0 L     5 Minute Oxygen Saturation % 95 %     5 Minute Liters of Oxygen 0 L     6 Minute Oxygen Saturation % 94 %     6 Minute Liters of Oxygen 0 L     2 Minute Post Oxygen Saturation % 96 %     2 Minute Post Liters of Oxygen 0 L              Oxygen Initial Assessment:  Oxygen Initial Assessment - 12/27/21 1311       Home Oxygen   Home Oxygen Device None    Sleep Oxygen Prescription None    Home Exercise Oxygen Prescription None    Home Resting Oxygen Prescription None      Initial 6 min Walk   Oxygen Used None      Program Oxygen Prescription   Program Oxygen Prescription None      Intervention   Short Term Goals To learn and understand importance of monitoring SPO2 with pulse oximeter and demonstrate accurate use of the pulse oximeter.;To learn and understand importance of maintaining oxygen saturations>88%;To learn and demonstrate proper pursed lip breathing techniques or other breathing techniques.     Long  Term Goals Verbalizes importance of monitoring SPO2 with pulse oximeter and return demonstration;Maintenance of O2 saturations>88%;Exhibits proper breathing techniques, such as pursed lip breathing or other method taught during program session             Oxygen Re-Evaluation:  Oxygen Re-Evaluation     Row Name 10/16/21 1359 01/08/22 1604 02/05/22 1526 03/05/22 1527 04/02/22 1549     Program Oxygen Prescription   Program Oxygen Prescription None None None None None     Home Oxygen   Home Oxygen Device None None None None None   Sleep Oxygen Prescription None None None None None   Home Exercise Oxygen Prescription None None None None None   Home Resting Oxygen Prescription None None None None None   Compliance  with Home Oxygen Use Yes Yes Yes Yes Yes     Goals/Expected Outcomes   Short Term Goals To learn and understand importance of maintaining oxygen saturations>88%;To learn and demonstrate proper use of respiratory medications;To learn and understand importance of monitoring SPO2 with pulse oximeter and demonstrate accurate use of the pulse oximeter. To learn and understand importance of monitoring SPO2 with pulse oximeter and demonstrate accurate use of the pulse oximeter.;To learn and understand importance of maintaining oxygen saturations>88%;To learn and demonstrate proper pursed lip breathing techniques or other breathing techniques.  To learn and understand importance of monitoring SPO2 with pulse oximeter and demonstrate accurate use of the pulse oximeter.;To learn and understand importance of maintaining oxygen saturations>88%;To learn and demonstrate proper pursed lip breathing techniques or other breathing techniques.  To learn and understand importance of monitoring SPO2 with pulse oximeter and demonstrate accurate use of the pulse oximeter.;To learn and understand importance of maintaining oxygen saturations>88%;To learn and demonstrate proper pursed lip breathing techniques or other breathing techniques.  To learn and understand importance of monitoring SPO2 with pulse oximeter and demonstrate accurate use of the pulse oximeter.;To learn and understand importance of maintaining oxygen saturations>88%;To learn and demonstrate proper pursed lip breathing techniques or other breathing techniques.    Long  Term Goals Verbalizes importance of monitoring SPO2 with pulse oximeter and return demonstration;Maintenance of O2 saturations>88%;Exhibits proper breathing techniques, such as pursed lip breathing or other method taught during program session;Compliance with respiratory medication Verbalizes importance of monitoring SPO2 with pulse oximeter and return demonstration;Maintenance of O2  saturations>88%;Exhibits proper breathing techniques, such as pursed lip breathing or other method taught during program session Verbalizes importance of monitoring SPO2 with pulse oximeter and return demonstration;Maintenance of O2 saturations>88%;Exhibits proper breathing techniques, such as pursed lip breathing or other method taught during program session Verbalizes importance of monitoring SPO2 with pulse oximeter and return demonstration;Maintenance of O2 saturations>88%;Exhibits proper breathing techniques, such as pursed lip breathing or other method taught during program  session Verbalizes importance of monitoring SPO2 with pulse oximeter and return demonstration;Maintenance of O2 saturations>88%;Exhibits proper breathing techniques, such as pursed lip breathing or other method taught during program session   Goals/Expected Outcomes compliance compliance compliance compliance compliance            Oxygen Discharge (Final Oxygen Re-Evaluation):  Oxygen Re-Evaluation - 04/02/22 1549       Program Oxygen Prescription   Program Oxygen Prescription None      Home Oxygen   Home Oxygen Device None    Sleep Oxygen Prescription None    Home Exercise Oxygen Prescription None    Home Resting Oxygen Prescription None    Compliance with Home Oxygen Use Yes      Goals/Expected Outcomes   Short Term Goals To learn and understand importance of monitoring SPO2 with pulse oximeter and demonstrate accurate use of the pulse oximeter.;To learn and understand importance of maintaining oxygen saturations>88%;To learn and demonstrate proper pursed lip breathing techniques or other breathing techniques.     Long  Term Goals Verbalizes importance of monitoring SPO2 with pulse oximeter and return demonstration;Maintenance of O2 saturations>88%;Exhibits proper breathing techniques, such as pursed lip breathing or other method taught during program session    Goals/Expected Outcomes compliance              Initial Exercise Prescription:  Initial Exercise Prescription - 12/27/21 1300       Date of Initial Exercise RX and Referring Provider   Date 12/27/21    Referring Provider Dr. Williemae Natter    Expected Discharge Date 04/30/22      Treadmill   MPH 1.5    Grade 0    Minutes 17      Recumbant Elliptical   Level 1    RPM 45    Minutes 22      Prescription Details   Frequency (times per week) 2    Duration Progress to 30 minutes of continuous aerobic without signs/symptoms of physical distress      Intensity   THRR 40-80% of Max Heartrate 58-115    Ratings of Perceived Exertion 11-13    Perceived Dyspnea 0-4      Resistance Training   Training Prescription Yes    Weight 4    Reps 10-15             Perform Capillary Blood Glucose checks as needed.  Exercise Prescription Changes:   Exercise Prescription Changes     Row Name 10/11/21 1400 10/30/21 1400 01/08/22 1500 01/22/22 1400 02/05/22 1500     Response to Exercise   Blood Pressure (Admit) 120/60 114/60 112/62 128/64 106/64   Blood Pressure (Exercise) 150/60 124/60 130/62 132/60 124/68   Blood Pressure (Exit) 120/60 122/64 122/58 108/60 126/64   Heart Rate (Admit) 78 bpm 82 bpm 77 bpm 80 bpm 60 bpm   Heart Rate (Exercise) 85 bpm 90 bpm 92 bpm 90 bpm 98 bpm   Heart Rate (Exit) 86 bpm 90 bpm 80 bpm 85 bpm 62 bpm   Oxygen Saturation (Admit) 96 % 96 % 95 % 94 % 92 %   Oxygen Saturation (Exercise) 94 % 95 % 93 % 94 % 94 %   Oxygen Saturation (Exit) 94 % 94 % 95 % 94 % 92 %   Rating of Perceived Exertion (Exercise) 12 12 12 12 12    Perceived Dyspnea (Exercise) 12 12 12 12 12    Duration Continue with 30 min of aerobic exercise without signs/symptoms of physical distress. Continue  with 30 min of aerobic exercise without signs/symptoms of physical distress. Continue with 30 min of aerobic exercise without signs/symptoms of physical distress. Continue with 30 min of aerobic exercise without signs/symptoms of physical  distress. Continue with 30 min of aerobic exercise without signs/symptoms of physical distress.   Intensity THRR unchanged THRR unchanged THRR unchanged THRR unchanged THRR unchanged     Progression   Progression Continue to progress workloads to maintain intensity without signs/symptoms of physical distress. Continue to progress workloads to maintain intensity without signs/symptoms of physical distress. Continue to progress workloads to maintain intensity without signs/symptoms of physical distress. Continue to progress workloads to maintain intensity without signs/symptoms of physical distress. Continue to progress workloads to maintain intensity without signs/symptoms of physical distress.     Resistance Training   Training Prescription Yes Yes Yes Yes Yes   Weight 4 4 4 4 4    Reps 10-15 10-15 10-15 10-15 10-15   Time 10 Minutes 10 Minutes 10 Minutes 10 Minutes 10 Minutes     Treadmill   MPH 2.5 2.4 1.8 2.2 2.2   Grade 0 0 0 0 0   Minutes 17 17 17 17 17    METs 2.91 2.87 2.38 2.53 2.53     Recumbant Elliptical   Level 1 1 1 2 2    RPM 54 44 34 36 35   Minutes 22 22 22 22 22    METs 3 2.3 2.1 1.6 2    Row Name 02/19/22 1400 03/05/22 1500 03/14/22 1500 04/02/22 1500       Response to Exercise   Blood Pressure (Admit) 138/72 114/70 110/50 120/60    Blood Pressure (Exercise) 134/66 132/62 134/60 134/76    Blood Pressure (Exit) 114/62 122/64 108/54 124/68    Heart Rate (Admit) 78 bpm 81 bpm 71 bpm 71 bpm    Heart Rate (Exercise) 93 bpm 93 bpm 89 bpm 85 bpm    Heart Rate (Exit) 83 bpm 82 bpm 82 bpm 82 bpm    Oxygen Saturation (Admit) 95 % 94 % 94 % 95 %    Oxygen Saturation (Exercise) 94 % 93 % 93 % 93 %    Oxygen Saturation (Exit) 95 % 96 % 93 % 97 %    Rating of Perceived Exertion (Exercise) 12 12 12 12     Perceived Dyspnea (Exercise) 12 12 12 12     Duration Continue with 30 min of aerobic exercise without signs/symptoms of physical distress. Continue with 30 min of aerobic  exercise without signs/symptoms of physical distress. Continue with 30 min of aerobic exercise without signs/symptoms of physical distress. Continue with 30 min of aerobic exercise without signs/symptoms of physical distress.    Intensity THRR unchanged THRR unchanged THRR unchanged THRR unchanged      Progression   Progression Continue to progress workloads to maintain intensity without signs/symptoms of physical distress. Continue to progress workloads to maintain intensity without signs/symptoms of physical distress. Continue to progress workloads to maintain intensity without signs/symptoms of physical distress. Continue to progress workloads to maintain intensity without signs/symptoms of physical distress.      Resistance Training   Training Prescription Yes Yes Yes Yes    Weight 5 5 5 5     Reps 10-15 10-15 10-15 10-15    Time 10 Minutes 10 Minutes 10 Minutes 10 Minutes      Treadmill   MPH 2.2 2 2.2 2    Grade 0 0 0 0    Minutes 17 17 17  17  METs 2.53 2.53 2.53 2.53      Recumbant Elliptical   Level 3 4 4 4     RPM 34 34 32 34    Minutes 22 22 22 22     METs 2 2.1 1.7 1.7             Exercise Comments:   Exercise Goals and Review:   Exercise Goals     Row Name 10/16/21 1355 12/27/21 1334 01/08/22 1601 02/05/22 1527 03/05/22 1522     Exercise Goals   Increase Physical Activity Yes Yes Yes Yes Yes   Intervention Provide advice, education, support and counseling about physical activity/exercise needs.;Develop an individualized exercise prescription for aerobic and resistive training based on initial evaluation findings, risk stratification, comorbidities and participant's personal goals. Provide advice, education, support and counseling about physical activity/exercise needs.;Develop an individualized exercise prescription for aerobic and resistive training based on initial evaluation findings, risk stratification, comorbidities and participant's personal goals. Provide  advice, education, support and counseling about physical activity/exercise needs.;Develop an individualized exercise prescription for aerobic and resistive training based on initial evaluation findings, risk stratification, comorbidities and participant's personal goals. Provide advice, education, support and counseling about physical activity/exercise needs.;Develop an individualized exercise prescription for aerobic and resistive training based on initial evaluation findings, risk stratification, comorbidities and participant's personal goals. Provide advice, education, support and counseling about physical activity/exercise needs.;Develop an individualized exercise prescription for aerobic and resistive training based on initial evaluation findings, risk stratification, comorbidities and participant's personal goals.   Expected Outcomes Short Term: Attend rehab on a regular basis to increase amount of physical activity.;Long Term: Add in home exercise to make exercise part of routine and to increase amount of physical activity.;Long Term: Exercising regularly at least 3-5 days a week. Short Term: Attend rehab on a regular basis to increase amount of physical activity.;Long Term: Add in home exercise to make exercise part of routine and to increase amount of physical activity.;Long Term: Exercising regularly at least 3-5 days a week. Short Term: Attend rehab on a regular basis to increase amount of physical activity.;Long Term: Add in home exercise to make exercise part of routine and to increase amount of physical activity.;Long Term: Exercising regularly at least 3-5 days a week. Short Term: Attend rehab on a regular basis to increase amount of physical activity.;Long Term: Add in home exercise to make exercise part of routine and to increase amount of physical activity.;Long Term: Exercising regularly at least 3-5 days a week. Short Term: Attend rehab on a regular basis to increase amount of physical  activity.;Long Term: Add in home exercise to make exercise part of routine and to increase amount of physical activity.;Long Term: Exercising regularly at least 3-5 days a week.   Increase Strength and Stamina Yes Yes Yes Yes Yes   Intervention Provide advice, education, support and counseling about physical activity/exercise needs.;Develop an individualized exercise prescription for aerobic and resistive training based on initial evaluation findings, risk stratification, comorbidities and participant's personal goals. Provide advice, education, support and counseling about physical activity/exercise needs.;Develop an individualized exercise prescription for aerobic and resistive training based on initial evaluation findings, risk stratification, comorbidities and participant's personal goals. Provide advice, education, support and counseling about physical activity/exercise needs.;Develop an individualized exercise prescription for aerobic and resistive training based on initial evaluation findings, risk stratification, comorbidities and participant's personal goals. Provide advice, education, support and counseling about physical activity/exercise needs.;Develop an individualized exercise prescription for aerobic and resistive training based on initial evaluation findings, risk  stratification, comorbidities and participant's personal goals. Provide advice, education, support and counseling about physical activity/exercise needs.;Develop an individualized exercise prescription for aerobic and resistive training based on initial evaluation findings, risk stratification, comorbidities and participant's personal goals.   Expected Outcomes Short Term: Increase workloads from initial exercise prescription for resistance, speed, and METs.;Short Term: Perform resistance training exercises routinely during rehab and add in resistance training at home;Long Term: Improve cardiorespiratory fitness, muscular endurance and  strength as measured by increased METs and functional capacity (6MWT) Short Term: Increase workloads from initial exercise prescription for resistance, speed, and METs.;Short Term: Perform resistance training exercises routinely during rehab and add in resistance training at home;Long Term: Improve cardiorespiratory fitness, muscular endurance and strength as measured by increased METs and functional capacity (6MWT) Short Term: Increase workloads from initial exercise prescription for resistance, speed, and METs.;Short Term: Perform resistance training exercises routinely during rehab and add in resistance training at home;Long Term: Improve cardiorespiratory fitness, muscular endurance and strength as measured by increased METs and functional capacity (6MWT) Short Term: Increase workloads from initial exercise prescription for resistance, speed, and METs.;Short Term: Perform resistance training exercises routinely during rehab and add in resistance training at home;Long Term: Improve cardiorespiratory fitness, muscular endurance and strength as measured by increased METs and functional capacity (6MWT) Short Term: Increase workloads from initial exercise prescription for resistance, speed, and METs.;Short Term: Perform resistance training exercises routinely during rehab and add in resistance training at home;Long Term: Improve cardiorespiratory fitness, muscular endurance and strength as measured by increased METs and functional capacity (6MWT)   Able to understand and use rate of perceived exertion (RPE) scale Yes Yes Yes Yes Yes   Intervention Provide education and explanation on how to use RPE scale Provide education and explanation on how to use RPE scale Provide education and explanation on how to use RPE scale Provide education and explanation on how to use RPE scale Provide education and explanation on how to use RPE scale   Expected Outcomes -- Short Term: Able to use RPE daily in rehab to express  subjective intensity level;Long Term:  Able to use RPE to guide intensity level when exercising independently Short Term: Able to use RPE daily in rehab to express subjective intensity level;Long Term:  Able to use RPE to guide intensity level when exercising independently Short Term: Able to use RPE daily in rehab to express subjective intensity level;Long Term:  Able to use RPE to guide intensity level when exercising independently Short Term: Able to use RPE daily in rehab to express subjective intensity level;Long Term:  Able to use RPE to guide intensity level when exercising independently   Able to understand and use Dyspnea scale Yes Yes Yes Yes Yes   Intervention Provide education and explanation on how to use Dyspnea scale Provide education and explanation on how to use Dyspnea scale Provide education and explanation on how to use Dyspnea scale Provide education and explanation on how to use Dyspnea scale Provide education and explanation on how to use Dyspnea scale   Expected Outcomes Short Term: Able to use Dyspnea scale daily in rehab to express subjective sense of shortness of breath during exertion;Long Term: Able to use Dyspnea scale to guide intensity level when exercising independently Short Term: Able to use Dyspnea scale daily in rehab to express subjective sense of shortness of breath during exertion;Long Term: Able to use Dyspnea scale to guide intensity level when exercising independently Short Term: Able to use Dyspnea scale daily in rehab to  express subjective sense of shortness of breath during exertion;Long Term: Able to use Dyspnea scale to guide intensity level when exercising independently Short Term: Able to use Dyspnea scale daily in rehab to express subjective sense of shortness of breath during exertion;Long Term: Able to use Dyspnea scale to guide intensity level when exercising independently Short Term: Able to use Dyspnea scale daily in rehab to express subjective sense of  shortness of breath during exertion;Long Term: Able to use Dyspnea scale to guide intensity level when exercising independently   Knowledge and understanding of Target Heart Rate Range (THRR) Yes Yes Yes Yes Yes   Intervention Provide education and explanation of THRR including how the numbers were predicted and where they are located for reference Provide education and explanation of THRR including how the numbers were predicted and where they are located for reference Provide education and explanation of THRR including how the numbers were predicted and where they are located for reference Provide education and explanation of THRR including how the numbers were predicted and where they are located for reference Provide education and explanation of THRR including how the numbers were predicted and where they are located for reference   Expected Outcomes Short Term: Able to state/look up THRR;Short Term: Able to use daily as guideline for intensity in rehab;Long Term: Able to use THRR to govern intensity when exercising independently Short Term: Able to state/look up THRR;Short Term: Able to use daily as guideline for intensity in rehab;Long Term: Able to use THRR to govern intensity when exercising independently Short Term: Able to state/look up THRR;Short Term: Able to use daily as guideline for intensity in rehab;Long Term: Able to use THRR to govern intensity when exercising independently Short Term: Able to state/look up THRR;Short Term: Able to use daily as guideline for intensity in rehab;Long Term: Able to use THRR to govern intensity when exercising independently Short Term: Able to state/look up THRR;Short Term: Able to use daily as guideline for intensity in rehab;Long Term: Able to use THRR to govern intensity when exercising independently   Understanding of Exercise Prescription Yes Yes Yes Yes Yes   Intervention Provide education, explanation, and written materials on patient's individual exercise  prescription Provide education, explanation, and written materials on patient's individual exercise prescription Provide education, explanation, and written materials on patient's individual exercise prescription Provide education, explanation, and written materials on patient's individual exercise prescription Provide education, explanation, and written materials on patient's individual exercise prescription   Expected Outcomes Short Term: Able to explain program exercise prescription;Long Term: Able to explain home exercise prescription to exercise independently Short Term: Able to explain program exercise prescription;Long Term: Able to explain home exercise prescription to exercise independently Short Term: Able to explain program exercise prescription;Long Term: Able to explain home exercise prescription to exercise independently Short Term: Able to explain program exercise prescription;Long Term: Able to explain home exercise prescription to exercise independently Short Term: Able to explain program exercise prescription;Long Term: Able to explain home exercise prescription to exercise independently    [place] Name 04/02/22 1541             Exercise Goals   Increase Physical Activity Yes       Intervention Provide advice, education, support and counseling about physical activity/exercise needs.;Develop an individualized exercise prescription for aerobic and resistive training based on initial evaluation findings, risk stratification, comorbidities and participant's personal goals.       Expected Outcomes Short Term: Attend rehab on a regular basis to increase amount  of physical activity.;Long Term: Add in home exercise to make exercise part of routine and to increase amount of physical activity.;Long Term: Exercising regularly at least 3-5 days a week.       Increase Strength and Stamina Yes       Intervention Provide advice, education, support and counseling about physical activity/exercise  needs.;Develop an individualized exercise prescription for aerobic and resistive training based on initial evaluation findings, risk stratification, comorbidities and participant's personal goals.       Expected Outcomes Short Term: Increase workloads from initial exercise prescription for resistance, speed, and METs.;Short Term: Perform resistance training exercises routinely during rehab and add in resistance training at home;Long Term: Improve cardiorespiratory fitness, muscular endurance and strength as measured by increased METs and functional capacity (6MWT)       Able to understand and use rate of perceived exertion (RPE) scale Yes       Intervention Provide education and explanation on how to use RPE scale       Expected Outcomes Short Term: Able to use RPE daily in rehab to express subjective intensity level;Long Term:  Able to use RPE to guide intensity level when exercising independently       Able to understand and use Dyspnea scale Yes       Intervention Provide education and explanation on how to use Dyspnea scale       Expected Outcomes Short Term: Able to use Dyspnea scale daily in rehab to express subjective sense of shortness of breath during exertion;Long Term: Able to use Dyspnea scale to guide intensity level when exercising independently       Knowledge and understanding of Target Heart Rate Range (THRR) Yes       Intervention Provide education and explanation of THRR including how the numbers were predicted and where they are located for reference       Expected Outcomes Short Term: Able to state/look up THRR;Short Term: Able to use daily as guideline for intensity in rehab;Long Term: Able to use THRR to govern intensity when exercising independently       Understanding of Exercise Prescription Yes       Intervention Provide education, explanation, and written materials on patient's individual exercise prescription       Expected Outcomes Short Term: Able to explain program  exercise prescription;Long Term: Able to explain home exercise prescription to exercise independently                Exercise Goals Re-Evaluation :  Exercise Goals Re-Evaluation     [place] Name 10/16/21 1355 01/08/22 1601 02/05/22 1528 03/05/22 1524 04/02/22 1541     Exercise Goal Re-Evaluation   Exercise Goals Review Increase Strength and Stamina;Increase Physical Activity;Able to understand and use rate of perceived exertion (RPE) scale;Able to understand and use Dyspnea scale;Knowledge and understanding of Target Heart Rate Range (THRR);Understanding of Exercise Prescription Increase Physical Activity;Increase Strength and Stamina;Able to understand and use rate of perceived exertion (RPE) scale;Able to understand and use Dyspnea scale;Knowledge and understanding of Target Heart Rate Range (THRR);Understanding of Exercise Prescription Increase Physical Activity;Increase Strength and Stamina;Able to understand and use rate of perceived exertion (RPE) scale;Able to understand and use Dyspnea scale;Knowledge and understanding of Target Heart Rate Range (THRR);Understanding of Exercise Prescription Increase Physical Activity;Increase Strength and Stamina;Able to understand and use rate of perceived exertion (RPE) scale;Able to understand and use Dyspnea scale;Knowledge and understanding of Target Heart Rate Range (THRR);Understanding of Exercise Prescription Increase Physical Activity;Increase Strength and Stamina;Able to  understand and use rate of perceived exertion (RPE) scale;Able to understand and use Dyspnea scale;Knowledge and understanding of Target Heart Rate Range (THRR);Understanding of Exercise Prescription   Comments Pt has completed 9 sessions of PR. 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 He is increasing his speed on the treadmill and RPM on the ellp. He has been tired recently due to [place] but pushes through. He is still exercising at home on days off from class and treatments. He is  currently exercising at 3.0 METs on the ellp. Will continue to monitor and progress as able. Pt has completed 3 session of PR. He was ready to get back to class after his surgery. He is progressing each class with his levels. He is currently exercising at 2.38 METs on the treadmill. Will continue to monitor and progress as able. Pt has completed 12 sessions of PR. He continues to progress following his lobectomy. He is motivated to progress. He is currently exercising at 2.53 METs on the TM. Will continue to monitor and progress as able. Pt has completed 17 sessions of PR. HE continues to progress in the program and is increasing his workloads. He recently had a scan and they found no spots in his lungs but found a spot in his esophagus so he will be starting chemo and radation soon. He is currently exercising at 2.53 METs on the treadmill. Will continue to monitor and progress as able. Pt has completed 22 sessions of PR. He is tolerating exercise well while going through radiation treatment. He is motivated when he is in class and pushes himself. He is currently exercising at 2.53 METs on the treadmill.   Expected Outcomes Through exercise at home and at rehab, the patient will meet their stated goals. Through exercise at home and at rehab, the patient will meet their stated goals. Through exercise at home and at rehab, the patient will meet their stated goals. Through exercise at home and at rehab, the patient will meet their stated goals. Through exercise at home and at rehab, the patient will meet their stated goals.            Discharge Exercise Prescription (Final Exercise Prescription Changes):  Exercise Prescription Changes - 04/02/22 1500       Response to Exercise   Blood Pressure (Admit) 120/60    Blood Pressure (Exercise) 134/76    Blood Pressure (Exit) 124/68    Heart Rate (Admit) 71 bpm    Heart Rate (Exercise) 85 bpm    Heart Rate (Exit) 82 bpm    Oxygen Saturation (Admit) 95 %     Oxygen Saturation (Exercise) 93 %    Oxygen Saturation (Exit) 97 %    Rating of Perceived Exertion (Exercise) 12    Perceived Dyspnea (Exercise) 12    Duration Continue with 30 min of aerobic exercise without signs/symptoms of physical distress.    Intensity THRR unchanged      Progression   Progression Continue to progress workloads to maintain intensity without signs/symptoms of physical distress.      Resistance Training   Training Prescription Yes    Weight 5    Reps 10-15    Time 10 Minutes      Treadmill   MPH 2    Grade 0    Minutes 17    METs 2.53      Recumbant Elliptical   Level 4    RPM 34    Minutes 22    METs 1.7  Nutrition:  Target Goals: Understanding of nutrition guidelines, daily intake of sodium 1500mg , cholesterol 200mg , calories 30% from fat and 7% or less from saturated fats, daily to have 5 or more servings of fruits and vegetables.  Biometrics:  Pre Biometrics - 12/27/21 1335       Pre Biometrics   Height 6' (1.829 m)    Weight 81.5 kg    Waist Circumference 40 inches    Hip Circumference 39 inches    Waist to Hip Ratio 1.03 %    BMI (Calculated) 24.36    Triceps Skinfold 10 mm    % Body Fat 24.7 %    Grip Strength 28.6 kg    Flexibility 0 in    Single Leg Stand 0 seconds              Nutrition Therapy Plan and Nutrition Goals:  Nutrition Therapy & Goals - 12/27/21 1314       Personal Nutrition Goals   Comments Patient scored 43 on his diet assessment. Handout provided and explained regarding healthier choices. We offer 2 educational sessions on heart healthy nutrition and assistance with RD referral if patient is interested.      Intervention Plan   Intervention Nutrition handout(s) given to patient.    Expected Outcomes Short Term Goal: Understand basic principles of dietary content, such as calories, fat, sodium, cholesterol and nutrients.             Nutrition Assessments:  Nutrition Assessments -  12/27/21 1314       MEDFICTS Scores   Pre Score 43            MEDIFICTS Score Key: ?70 Need to make dietary changes  40-70 Heart Healthy Diet ? 40 Therapeutic Level Cholesterol Diet   Picture Your Plate Scores: [id number] Unhealthy dietary pattern with much room for improvement. 41-50 Dietary pattern unlikely to meet recommendations for good health and room for improvement. 51-60 More healthful dietary pattern, with some room for improvement.  >60 Healthy dietary pattern, although there may be some specific behaviors that could be improved.    Nutrition Goals Re-Evaluation:   Nutrition Goals Discharge (Final Nutrition Goals Re-Evaluation):   Psychosocial: Target Goals: Acknowledge presence or absence of significant depression and/or stress, maximize coping skills, provide positive support system. Participant is able to verbalize types and ability to use techniques and skills needed for reducing stress and depression.  Initial Review & Psychosocial Screening:  Initial Psych Review & Screening - 12/27/21 1317       Initial Review   Current issues with None Identified      Family Dynamics   Good Support System? Yes      Barriers   Psychosocial barriers to participate in program There are no identifiable barriers or psychosocial needs.      Screening Interventions   Interventions Encouraged to exercise;Provide feedback about the scores to participant    Expected Outcomes Short Term goal: Identification and review with participant of any Quality of Life or Depression concerns found by scoring the questionnaire.             Quality of Life Scores:  Quality of Life - 12/27/21 1338       Quality of Life   Select Quality of Life      Quality of Life Scores   Health/Function Pre 27.48 %    Socioeconomic Pre 24.81 %    Psych/Spiritual Pre 30 %    Family Pre 30 %  GLOBAL Pre 30 %            Scores of 19 and below usually indicate a poorer quality of life in  these areas.  A difference of  2-3 points is a clinically meaningful difference.  A difference of 2-3 points in the total score of the Quality of Life Index has been associated with significant improvement in overall quality of life, self-image, physical symptoms, and general health in studies assessing change in quality of life.   PHQ-9: Review Flowsheet  More data exists      12/27/2021 09/24/2021 09/05/2021 08/29/2021 09/18/2020  Depression screen PHQ 2/9  Decreased Interest 0 0 0 0 0  Down, Depressed, Hopeless 0 0 0 0 0  PHQ - 2 Score 0 0 0 0 0  Altered sleeping 0 0 0 0 -  Tired, decreased energy 0 0 0 0 -  Change in appetite 0 0 0 0 -  Feeling bad or failure about yourself  0 0 0 0 -  Trouble concentrating 0 0 0 0 -  Moving slowly or fidgety/restless 0 0 0 0 -  Suicidal thoughts 0 0 0 0 -  PHQ-9 Score 0 0 0 0 -  Difficult doing work/chores - Not difficult at all - Not difficult at all -   Interpretation of Total Score  Total Score Depression Severity:  1-4 = Minimal depression, 5-9 = Mild depression, 10-14 = Moderate depression, 15-19 = Moderately severe depression, 20-27 = Severe depression   Psychosocial Evaluation and Intervention:  Psychosocial Evaluation - 12/27/21 1318       Psychosocial Evaluation & Interventions   Interventions Encouraged to exercise with the program and follow exercise prescription;Stress management education;Relaxation education    Comments Patient has no psychosocial barriers identified to participate in PR at his orientation visit. His PHQ-9 score was 0.  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 He has cancer in his left lung and 18 out of 20 lymph nodes taken during the surgery were postive for cancer. Patient says he was told his case is complicated and they have not decided how to treat the remaining cancer moving forward. He says they have been talking about immunotherapy but no decision  has been made. He says he is more SOB now than before his surgery. He hopes this will improve. He has a good support system with his wife. He is ready to start the program hoping to get back to his normal strength level.    Expected Outcomes Pt will continue to have no identifiable psychosocial issues.    Continue Psychosocial Services  No Follow up required             Psychosocial Re-Evaluation:  Psychosocial Re-Evaluation     [place] Name 10/08/21 1507 01/04/22 0844 01/28/22 1501 02/25/22 1107 03/26/22 1454     Psychosocial Re-Evaluation   Current issues with None Identified None Identified None Identified None Identified --   Comments Patient has completed 7 sessions.  He continues to have no psychosocial barriers or issues identified.  He seems to enjoy coming to class and very interactiver with class and staff.  He demonstrates an interest in improving his health.   We will continue to monitor as he progresses in the program. Patient has started PR over, due to having surgery.  He has attended 2 sessions.  He was referred via Dr. Williemae Natter with Lung cancer.  He continues to  have no psychosocial barriers or issues identified.  He seems to enjoy coming to class and demonstrates a very positive outlook with class and staff.  He demonstrates an interest in improving his health.   We will continue to monitor as he progresses in the program. Patient has attended 8 sessions and continues to have no psychosocial issues identified.    He was referred via Dr. Williemae Natter with Lung cancer.   He continues to have no psychosocial barriers or issues identified.  He seems to enjoy coming to class and demonstrates a very positive outlook and very interative with others in class and staff.  He demonstrates an interest in improving his health.   We will continue to monitor as he progresses in the program. Patient has attended 15 sessions and continues to have no psychosocial issues identified.    He seems to enjoy coming to  class and demonstrates a very positive outlook and very interative with others in class and staff.   He demonstrates an interest in improving his health and works hard in [place].   We will continue to monitor as he progresses in the program. Patient has attended 20 sessions and continues to have no psychosocial issues identified.    He seems to enjoy coming to class and demonstrates a very positive outlook and very interative with others in class and staff.   He demonstrates an interest in improving his health and works hard in [place].   We will continue to monitor as he progresses in the program.   Expected Outcomes Patient will continue to have no psychosocial barriers or issues identified. Patient will continue to have no psychosocial barriers or issues identified. Patient will continue to have no psychosocial barriers or issues identified. Patient will continue to have no psychosocial barriers or issues identified. Patient will continue to have no psychosocial barriers or issues identified.   Interventions Encouraged to attend Pulmonary Rehabilitation for the exercise;Relaxation education;Encouraged to attend Cardiac Rehabilitation for the exercise Encouraged to attend Pulmonary Rehabilitation for the exercise;Relaxation education;Encouraged to attend Cardiac Rehabilitation for the exercise Encouraged to attend Pulmonary Rehabilitation for the exercise;Relaxation education;Encouraged to attend Cardiac Rehabilitation for the exercise Encouraged to attend Pulmonary Rehabilitation for the exercise;Relaxation education;Encouraged to attend Cardiac Rehabilitation for the exercise Encouraged to attend Pulmonary Rehabilitation for the exercise;Relaxation education;Encouraged to attend Cardiac Rehabilitation for the exercise   Continue Psychosocial Services  No Follow up required No Follow up required No Follow up required No Follow up required No Follow up required            Psychosocial Discharge (Final  Psychosocial Re-Evaluation):  Psychosocial Re-Evaluation - 03/26/22 1454       Psychosocial Re-Evaluation   Comments Patient has attended 20 sessions and continues to have no psychosocial issues identified.    He seems to enjoy coming to class and demonstrates a very positive outlook and very interative with others in class and staff.   He demonstrates an interest in improving his health and works hard in [place].   We will continue to monitor as he progresses in the program.    Expected Outcomes Patient will continue to have no psychosocial barriers or issues identified.    Interventions Encouraged to attend Pulmonary Rehabilitation for the exercise;Relaxation education;Encouraged to attend Cardiac Rehabilitation for the exercise    Continue Psychosocial Services  No Follow up required              Education: Education Goals: Education classes will be provided on a  weekly basis, covering required topics. Participant will state understanding/return demonstration of topics presented.  Learning Barriers/Preferences:  Learning Barriers/Preferences - 12/27/21 1315       Learning Barriers/Preferences   Learning Barriers None    Learning Preferences Audio;Written Material             Education Topics: How Lungs Work and Diseases: - Discuss the anatomy of the lungs and diseases that can affect the lungs, such as COPD. Flowsheet Row PULMONARY REHAB OTHER RESPIRATORY from 03/28/2022 in [place]  Date 10/11/21  Educator DF  Instruction Review Code 2- Demonstrated Understanding       Exercise: -Discuss the importance of exercise, FITT principles of exercise, normal and abnormal responses to exercise, and how to exercise safely.   Environmental Irritants: -Discuss types of environmental irritants and how to limit exposure to environmental irritants. Flowsheet Row PULMONARY REHAB OTHER RESPIRATORY from 03/28/2022 in [place]  Date  10/18/21  Instruction Review Code 1- Verbalizes Understanding       Meds/Inhalers and oxygen: - Discuss respiratory medications, definition of an inhaler and oxygen, and the proper way to use an inhaler and oxygen. Flowsheet Row PULMONARY REHAB OTHER RESPIRATORY from 03/28/2022 in [place]  Date 01/24/22  Educator HB       Energy Saving Techniques: - Discuss methods to conserve energy and decrease shortness of breath when performing activities of daily living.  Flowsheet Row PULMONARY REHAB OTHER RESPIRATORY from 03/28/2022 in [place]  Date 11/01/21  Educator HB  Instruction Review Code 1- Verbalizes Understanding       Bronchial Hygiene / Breathing Techniques: - Discuss breathing mechanics, pursed-lip breathing technique,  proper posture, effective ways to clear airways, and other functional breathing techniques Flowsheet Row PULMONARY REHAB OTHER RESPIRATORY from 03/28/2022 in [place]  Date 11/08/21  Educator HB  Instruction Review Code 1- Research scientist (medical): - Provides group verbal and written instruction about the health risks of elevated stress, cause of high stress, and healthy ways to reduce stress. Flowsheet Row PULMONARY REHAB OTHER RESPIRATORY from 03/28/2022 in [place]  Date 02/14/22  Educator HB  Instruction Review Code 1- Verbalizes Understanding       Nutrition I: Fats: - Discuss the types of cholesterol, what cholesterol does to the body, and how cholesterol levels can be controlled.   Nutrition II: Labels: -Discuss the different components of food labels and how to read food labels. Flowsheet Row PULMONARY REHAB OTHER RESPIRATORY from 03/28/2022 in [place]  Date 02/28/22  Educator DF  Instruction Review Code 1- Verbalizes Understanding       Respiratory Infections: - Discuss the signs and  symptoms of respiratory infections, ways to prevent respiratory infections, and the importance of seeking medical treatment when having a respiratory infection. Flowsheet Row PULMONARY REHAB OTHER RESPIRATORY from 03/28/2022 in [place]  Date 09/13/21  Educator DM  Instruction Review Code 1- Verbalizes Understanding       Stress I: Signs and Symptoms: - Discuss the causes of stress, how stress may lead to anxiety and depression, and ways to limit stress. Flowsheet Row PULMONARY REHAB OTHER RESPIRATORY from 03/28/2022 in [place]  Date 09/20/21  Educator [place]  Instruction Review Code 1- Verbalizes Understanding       Stress II: Relaxation: -Discuss relaxation techniques to limit stress. Flowsheet Row PULMONARY REHAB OTHER RESPIRATORY from 03/28/2022  in [place]  Date 09/27/21  Educator pb  Instruction Review Code 1- Verbalizes Understanding       Oxygen for Home/Travel: - Discuss how to prepare for travel when on oxygen and proper ways to transport and store oxygen to ensure safety. Flowsheet Row PULMONARY REHAB OTHER RESPIRATORY from 03/28/2022 in [place]  Date 10/04/21  Educator DF  Instruction Review Code 2- Demonstrated Understanding       Knowledge Questionnaire Score:  Knowledge Questionnaire Score - 12/27/21 1313       Knowledge Questionnaire Score   Pre Score 15/18             Core Components/Risk Factors/Patient Goals at Admission:  Personal Goals and Risk Factors at Admission - 12/27/21 1315       Core Components/Risk Factors/Patient Goals on Admission    Weight Management Weight Maintenance    Improve shortness of breath with ADL's Yes    Intervention Provide education, individualized exercise plan and daily activity instruction to help decrease symptoms of SOB with activities of daily living.    Expected Outcomes Short Term: Improve cardiorespiratory  fitness to achieve a reduction of symptoms when performing ADLs;Long Term: Be able to perform more ADLs without symptoms or delay the onset of symptoms    Increase knowledge of respiratory medications and ability to use respiratory devices properly  Yes    Intervention Provide education and demonstration as needed of appropriate use of medications, inhalers, and oxygen therapy.    Expected Outcomes Short Term: Achieves understanding of medications use. Understands that oxygen is a medication prescribed by physician. Demonstrates appropriate use of inhaler and oxygen therapy.;Long Term: Maintain appropriate use of medications, inhalers, and oxygen therapy.    Personal Goal Other Yes    Personal Goal Patient wants to be able to return to his normal strength.    Intervention Patient will attend PR 2 days/week with education and exercise.    Expected Outcomes Patient will complete the program meeting both personal and program goals.             Core Components/Risk Factors/Patient Goals Review:   Goals and Risk Factor Review     Row Name 10/08/21 1514 01/04/22 0853 01/28/22 1504 02/25/22 1109 03/26/22 1456     Core Components/Risk Factors/Patient Goals Review   Personal Goals Review Other Other Other Other --   Review Patient has completed 7 sessions and is doing well in the program with consistent attendance. His personal goals for the program are to get stronger and prepare his lungs for surgery for cancer. He is currently receiving chemotherapy.  He is tolerating exercise well on the Tm and recumbent elliptical and his O2 sats averaging around 93%-96% while exercising on room air.  We will continue to encourage his progress as he continues to works towards meeting these personal goals. Patient has started PR over, due to surgery.  He attended back in October and restarting over.  He has attended 2 sessions and is doing well in the program.  His personal goals for the program are to get stronger  and get back to his normal strength level.  He is tolerating exercise well on the Tm and recumbent elliptical and his O2 sats averaging around 94%-95% while exercising on room air.  We will continue to encourage his progress as he continues to works towards meeting these personal goals. Patient has attended 8 sessions and is doing well in the program.  His personal goals for  the program are to get stronger and get back to his normal strength level.  He is tolerating exercise well on the Tm and recumbent elliptical and his O2 sats averaging around 94%-98% while exercising and no need for supplemenetal oxygen at this time.  We will help with develping effiecient breathing techniques such as purse lipped breathing and practice self pacing with increased activity.   We will continue to encourage his progress as he continues to works towards meeting these personal goals. Patient has attended 15 sessions and is doing well in the program.  His personal goals for the program are to get stronger and get back to his normal strength level.  He is tolerating exercise well on the Tm and recumbent elliptical and his O2 sats averaging around 92%-96% while exercising and no need for supplemenetal oxygen at this time.  We will help with develping effiecient breathing techniques such as purse lipped breathing and practice self pacing with increased activity.   We will continue to encourage his progress as he continues to works towards meeting these personal goals. Patient has attended 20 sessions and is doing well in the program.  His has been missing some classes due to having starting on cemo  therapy for his lungs again.  His personal goals for the program are to get stronger and get back to his normal strength level.  He is tolerating exercise well on the Tm and recumbent elliptical and his O2 sats averaging around 93%-95% while exercising and no need for supplemenetal oxygen at this time.  We will help with develping effiecient  breathing techniques such as purse lipped breathing and practice self pacing with increased activity.   We will continue to encourage his progress as he continues to works towards meeting these personal goals.   Expected Outcomes Patient will complete the program meeting both personal and program goals. Patient will complete the program meeting both personal and program goals. Patient will complete the program meeting both personal and program goals. Patient will complete the program meeting both personal and program goals. Patient will complete the program meeting both personal and program goals.            Core Components/Risk Factors/Patient Goals at Discharge (Final Review):   Goals and Risk Factor Review - 03/26/22 1456       Core Components/Risk Factors/Patient Goals Review   Review Patient has attended 20 sessions and is doing well in the program.  His has been missing some classes due to having starting on cemo  therapy for his lungs again.  His personal goals for the program are to get stronger and get back to his normal strength level.  He is tolerating exercise well on the Tm and recumbent elliptical and his O2 sats averaging around 93%-95% while exercising and no need for supplemenetal oxygen at this time.  We will help with develping effiecient breathing techniques such as purse lipped breathing and practice self pacing with increased activity.   We will continue to encourage his progress as he continues to works towards meeting these personal goals.    Expected Outcomes Patient will complete the program meeting both personal and program goals.             ITP Comments:  ITP Comments     Row Name 11/19/21 1455           ITP Comments Pt discharged from PR on 11/2/023 after 16 sessions. He was participating in [place] to prepare his body for  his lobectomy to remove cancer from his lung. He underwent lobectomy surgery on 11/13/2021 at [facility]. I explained to pt that if he would like  to do PR again after he has recovered from his surgery, that he will need to get another referral from his doctor.                Comments: Marland KitchenMarland KitchenITP REVIEW Pt is making expected progress toward pulmonary rehab goals after completing 23 sessions. Recommend continued exercise, life style modification, education, and utilization of breathing techniques to increase stamina and strength and decrease shortness of breath with exertion.

## 2022-04-04 ENCOUNTER — Other Ambulatory Visit: Payer: Self-pay

## 2022-04-04 ENCOUNTER — Encounter (HOSPITAL_COMMUNITY)
Admission: RE | Admit: 2022-04-04 | Discharge: 2022-04-04 | Disposition: A | Discharge status: Home / Self Care | Payer: Medicare Other | Source: Ambulatory Visit | Attending: Thoracic Surgery (Cardiothoracic Vascular Surgery) | Admitting: Thoracic Surgery (Cardiothoracic Vascular Surgery)

## 2022-04-04 ENCOUNTER — Ambulatory Visit
Admission: RE | Admit: 2022-04-04 | Discharge: 2022-04-04 | Disposition: A | Discharge status: Home / Self Care | Payer: Medicare Other | Source: Ambulatory Visit | Attending: Radiation Oncology | Admitting: Radiation Oncology

## 2022-04-04 DIAGNOSIS — I509 Heart failure, unspecified: Secondary | ICD-10-CM | POA: Diagnosis not present

## 2022-04-04 DIAGNOSIS — I2510 Atherosclerotic heart disease of native coronary artery without angina pectoris: Secondary | ICD-10-CM | POA: Diagnosis not present

## 2022-04-04 DIAGNOSIS — Z803 Family history of malignant neoplasm of breast: Secondary | ICD-10-CM | POA: Diagnosis not present

## 2022-04-04 DIAGNOSIS — Z5111 Encounter for antineoplastic chemotherapy: Secondary | ICD-10-CM | POA: Diagnosis not present

## 2022-04-04 DIAGNOSIS — C3411 Malignant neoplasm of upper lobe, right bronchus or lung: Secondary | ICD-10-CM | POA: Diagnosis not present

## 2022-04-04 DIAGNOSIS — R3915 Urgency of urination: Secondary | ICD-10-CM | POA: Diagnosis not present

## 2022-04-04 DIAGNOSIS — Z510 Encounter for antineoplastic radiation therapy: Secondary | ICD-10-CM | POA: Diagnosis not present

## 2022-04-04 DIAGNOSIS — K5900 Constipation, unspecified: Secondary | ICD-10-CM | POA: Diagnosis not present

## 2022-04-04 DIAGNOSIS — I110 Hypertensive heart disease with heart failure: Secondary | ICD-10-CM | POA: Diagnosis not present

## 2022-04-04 DIAGNOSIS — C3412 Malignant neoplasm of upper lobe, left bronchus or lung: Secondary | ICD-10-CM

## 2022-04-04 DIAGNOSIS — G629 Polyneuropathy, unspecified: Secondary | ICD-10-CM | POA: Diagnosis not present

## 2022-04-04 DIAGNOSIS — R82998 Other abnormal findings in urine: Secondary | ICD-10-CM | POA: Diagnosis not present

## 2022-04-04 DIAGNOSIS — Z806 Family history of leukemia: Secondary | ICD-10-CM | POA: Diagnosis not present

## 2022-04-04 DIAGNOSIS — Z87891 Personal history of nicotine dependence: Secondary | ICD-10-CM | POA: Diagnosis not present

## 2022-04-04 LAB — RAD ONC ARIA SESSION SUMMARY
Course Elapsed Days: 16
Plan Fractions Treated to Date: 13
Plan Prescribed Dose Per Fraction: 2 Gy
Plan Total Fractions Prescribed: 30
Plan Total Prescribed Dose: 60 Gy
Reference Point Dosage Given to Date: 26 Gy
Reference Point Session Dosage Given: 2 Gy
Session Number: 13

## 2022-04-04 NOTE — Progress Notes (Signed)
Daily Session Note  Patient Details  Name: TAM PIANA MRN: [id number] Date of Birth: 27-Aug-1945 Referring Provider:   [facility] from 12/27/2021 in [place]  Referring Provider Dr. Williemae Natter       Encounter Date: 04/04/2022  Check In:  Session Check In - 04/04/22 1330       Check-In   Supervising physician immediately available to respond to emergencies [facility] MD immediately available    Physician(s) Dr Harl Bowie    Location [facility]    Staff Present Errik Mitchelle Hassell Done, RN, BSN;Dalton Sherrie George, MS, ACSM-CEP    Virtual Visit No    Medication changes reported     No    Fall or balance concerns reported    No    Tobacco Cessation No Change    Warm-up and Cool-down Performed as group-led instruction    Resistance Training Performed Yes    VAD Patient? No    PAD/SET Patient? No      Pain Assessment   Currently in Pain? No/denies    Pain Score 0-No pain    Multiple Pain Sites No             Capillary Blood Glucose: No results found for this or any previous visit (from the past 24 hour(s)).    Social History   Tobacco Use  Smoking Status Former   Packs/day: .25   Types: Cigarettes   Quit date: 05/2021   Years since quitting: 0.9  Smokeless Tobacco Never    Goals Met:  Proper associated with RPD/PD & O2 Sat Independence with exercise equipment Using PLB without cueing & demonstrates good technique Exercise tolerated well Queuing for purse lip breathing No report of concerns or symptoms today Strength training completed today  Goals Unmet:  Not Applicable  Comments: Checkout at 1430.   Dr. Kathie Dike is Medical Director for [facility].

## 2022-04-05 ENCOUNTER — Ambulatory Visit
Admission: RE | Admit: 2022-04-05 | Discharge: 2022-04-05 | Disposition: A | Discharge status: Home / Self Care | Payer: Medicare Other | Source: Ambulatory Visit | Attending: Radiation Oncology | Admitting: Radiation Oncology

## 2022-04-05 ENCOUNTER — Other Ambulatory Visit: Payer: Self-pay

## 2022-04-05 ENCOUNTER — Other Ambulatory Visit: Payer: Self-pay | Admitting: Internal Medicine

## 2022-04-05 ENCOUNTER — Telehealth: Payer: Self-pay | Admitting: Internal Medicine

## 2022-04-05 DIAGNOSIS — Z510 Encounter for antineoplastic radiation therapy: Secondary | ICD-10-CM | POA: Diagnosis not present

## 2022-04-05 DIAGNOSIS — R82998 Other abnormal findings in urine: Secondary | ICD-10-CM | POA: Diagnosis not present

## 2022-04-05 DIAGNOSIS — C3412 Malignant neoplasm of upper lobe, left bronchus or lung: Secondary | ICD-10-CM | POA: Diagnosis not present

## 2022-04-05 DIAGNOSIS — R3915 Urgency of urination: Secondary | ICD-10-CM | POA: Diagnosis not present

## 2022-04-05 DIAGNOSIS — Z806 Family history of leukemia: Secondary | ICD-10-CM | POA: Diagnosis not present

## 2022-04-05 DIAGNOSIS — Z803 Family history of malignant neoplasm of breast: Secondary | ICD-10-CM | POA: Diagnosis not present

## 2022-04-05 DIAGNOSIS — Z87891 Personal history of nicotine dependence: Secondary | ICD-10-CM | POA: Diagnosis not present

## 2022-04-05 DIAGNOSIS — I110 Hypertensive heart disease with heart failure: Secondary | ICD-10-CM | POA: Diagnosis not present

## 2022-04-05 DIAGNOSIS — Z5111 Encounter for antineoplastic chemotherapy: Secondary | ICD-10-CM | POA: Diagnosis not present

## 2022-04-05 DIAGNOSIS — K5900 Constipation, unspecified: Secondary | ICD-10-CM | POA: Diagnosis not present

## 2022-04-05 DIAGNOSIS — I2510 Atherosclerotic heart disease of native coronary artery without angina pectoris: Secondary | ICD-10-CM | POA: Diagnosis not present

## 2022-04-05 DIAGNOSIS — I509 Heart failure, unspecified: Secondary | ICD-10-CM | POA: Diagnosis not present

## 2022-04-05 DIAGNOSIS — C3411 Malignant neoplasm of upper lobe, right bronchus or lung: Secondary | ICD-10-CM | POA: Diagnosis not present

## 2022-04-05 DIAGNOSIS — G629 Polyneuropathy, unspecified: Secondary | ICD-10-CM | POA: Diagnosis not present

## 2022-04-05 LAB — RAD ONC ARIA SESSION SUMMARY
Course Elapsed Days: 17
Plan Fractions Treated to Date: 14
Plan Prescribed Dose Per Fraction: 2 Gy
Plan Total Fractions Prescribed: 30
Plan Total Prescribed Dose: 60 Gy
Reference Point Dosage Given to Date: 28 Gy
Reference Point Session Dosage Given: 2 Gy
Session Number: 14

## 2022-04-05 MED FILL — Fosaprepitant Dimeglumine For IV Infusion 150 MG (Base Eq): INTRAVENOUS | Qty: 5 | Status: AC

## 2022-04-05 MED FILL — Dexamethasone Sodium Phosphate Inj 100 MG/10ML: INTRAMUSCULAR | Qty: 1 | Status: AC

## 2022-04-05 NOTE — Progress Notes (Signed)
[facility] OFFICE PROGRESS NOTE  [name], MD [place] [address]  DIAGNOSIS: Recurrent non-small cell lung cancer, adenocarcinoma that was initially diagnosed as multifocal synchronous disease including stage IIIA (T1c, M2, M0) adenocarcinoma in the right upper lobe as well as stage IA (T1c, N0, M0) squamous cell carcinoma involving the right upper lobe and stage IA (T1b, N0, M0) adenocarcinoma involving the left upper lobe diagnosed in June 2023. After neoadjuvant treatment, final pathology showing residual disease with synchronous adenocarcinoma as well as squamous cell carcinoma in the right upper lobe and multiple mediastinal lymph node involvement performed on November 13, 2021. Unfortunately patient was found to have evidence for disease recurrence with involvement of 4R lymphadenopathy consistent with metastatic adenocarcinoma on February 26, 2022 biopsy.    Molecular studies showed no actionable mutations and PD-L1 expression was 15% in the adenocarcinoma in 2% and the squamous cell carcinoma.   PRIOR THERAPY: 1) neoadjuvant systemic chemotherapy with carboplatin, paclitaxel and nivolumab for 3 cycles in [facility].  2) right upper lobectomy with lymph node dissection at [facility] under the care of Dr. Williemae Natter   CURRENT THERAPY: concurrent chemotherapy with carboplatin for AUC of 5 and Alimta 500 Mg/M2 every 3 weeks for 2 cycles during the course of radiotherapy. He is status post 1 cycle of chemotherapy.   INTERVAL HISTORY: Patrick Harris 77 y.o. male returns to the clinic today for a follow-up visit accompanied by his wife.  The patient was last seen by myself on 03/27/2022.  He is feeling well today without any concerning complaints except he is starting to experience some esophagitis.  He is using carafate with only mild improvement. His weight is stable. He is currently undergoing chemo and radiation.  He is status post his first  cycle of treatment and tolerated it well. His last day radiation is tentatively scheduled for 04/29/2022.  Today he denies any fever, chills, night sweats, or unexplained weight loss.  He reports mild dyspnea on exertion with standing up which is unchanged/mildly improved.  He has improvement in his mild cough.  He denies any hemoptysis. He sometimes experiences constipation for which he takes laxatives and stool softeners.  Denies any nausea, vomiting, or diarrhea.  Denies any rashes or skin changes.  Denies any headache or visual changes.  He is here today for evaluation and repeat blood work before undergoing cycle #2.   MEDICAL HISTORY: Past Medical History:  Diagnosis Date   Arthritis    CAD (coronary artery disease)    CABG with LIMA to LAD and RIMA to RCA August 2000 - Dr. Roxan Hockey   Cancer [facility])    basal cell   CHF (congestive heart failure) (HCC)    COPD (chronic obstructive pulmonary disease) (HCC)    Elevated glycated hemoglobin    Essential hypertension    GERD (gastroesophageal reflux disease)    Heart murmur    Hyperlipidemia    Hypopituitarism ([place])    Lung cancer (HCC)    Osteopenia    Dr Wilson Singer   Pancreatitis    Peyronie disease    Testosterone deficiency    Dr Wilson Singer    ALLERGIES:  is allergic to latex, bromocriptine, zestril [lisinopril], diovan [valsartan], hydrochlorothiazide, norvasc [amlodipine], and zocor [simvastatin].  MEDICATIONS:  Current Outpatient Medications  Medication Sig Dispense Refill   acetaminophen (TYLENOL) 500 MG tablet Take 500 mg by mouth every 6 (six) hours as needed (for pain.).     aspirin EC  81 MG tablet Take 1 tablet (81 mg total) by mouth at bedtime. Okay to restart this medication on 07/03/2021 30 tablet 11   Cholecalciferol (VITAMIN D3) 2000 UNITS TABS Take 2,000 Units by mouth in the morning and at bedtime.     Coenzyme Q10 (COQ-10) 100 MG CAPS Take 100 mg by mouth in the morning.     Cyanocobalamin (B-12 PO) Take 1 tablet by  mouth daily.     dexamethasone (DECADRON) 4 MG tablet 1 tab p.o. twice daily the day before, day of and day after chemotherapy. 20 tablet 0   folic acid (FOLVITE) 1 MG tablet TAKE 1 TABLET BY MOUTH EVERY DAY 90 tablet 1   hydrALAZINE (APRESOLINE) 25 MG tablet Take 1 tablet (25 mg total) by mouth 3 (three) times daily. 270 tablet 3   irbesartan (AVAPRO) 300 MG tablet Take 1 tablet (300 mg total) by mouth daily. 90 tablet 3   nitroGLYCERIN (NITROSTAT) 0.4 MG SL tablet Place 1 tablet (0.4 mg total) under the tongue every 5 (five) minutes x 3 doses as needed for chest pain (if no relief after 2nd dose, proceed to ED or call 911). 25 tablet 3   Omega-3 Fatty Acids (OMEGA-3 FISH OIL PO) Take 1,000 mg by mouth in the morning.     omeprazole (PRILOSEC) 40 MG capsule Take 1 capsule (40 mg total) by mouth in the morning.     ondansetron (ZOFRAN) 8 MG tablet Take 1 tablet (8 mg total) by mouth every 8 (eight) hours as needed for vomiting or nausea. 20 tablet 0   Polyethyl Glycol-Propyl Glycol (LUBRICANT EYE DROPS) 0.4-0.3 % SOLN Place 1-2 drops into both eyes 3 (three) times daily as needed (dry/irritated eyes.).     prochlorperazine (COMPAZINE) 10 MG tablet Take 1 tablet (10 mg total) by mouth every 6 (six) hours as needed for nausea or vomiting. 30 tablet 0   rosuvastatin (CRESTOR) 20 MG tablet TAKE HALF A TABLET (10 MG) BY MOUTH ONCE DAILY. 45 tablet 3   sucralfate (CARAFATE) 1 g tablet Take 1 tablet (1 g total) by mouth 4 (four) times daily -  with meals and at bedtime. Crush and dissolve in 10 mL warm water prior to swallowing, take 20 min before meals 120 tablet 1   tadalafil (CIALIS) 5 MG tablet Take 5 mg by mouth daily as needed for erectile dysfunction.     tamsulosin (FLOMAX) 0.4 MG CAPS capsule Take 0.4 mg by mouth in the morning.     Testosterone 20.25 MG/ACT (1.62%) GEL Apply 2 Pump topically in the morning.     vitamin C (ASCORBIC ACID) 500 MG tablet Take 500 mg by mouth in the morning.     No  current facility-administered medications for this visit.    SURGICAL HISTORY:  Past Surgical History:  Procedure Laterality Date   Bone spur  2006   5th toe bilaterally   BRONCHIAL BIOPSY  07/02/2021   Procedure: BRONCHIAL BIOPSIES;  Surgeon: Collene Gobble, MD;  Location: [facility] ENDOSCOPY;  Service: Pulmonary;;   BRONCHIAL BRUSHINGS  07/02/2021   Procedure: BRONCHIAL BRUSHINGS;  Surgeon: Collene Gobble, MD;  Location: [facility] ENDOSCOPY;  Service: Pulmonary;;   BRONCHIAL NEEDLE ASPIRATION BIOPSY  07/02/2021   Procedure: BRONCHIAL NEEDLE ASPIRATION BIOPSIES;  Surgeon: Collene Gobble, MD;  Location: [facility];  Service: Pulmonary;;   COLONOSCOPY  2007   negative; Dr Sharlett Iles   CORONARY ARTERY BYPASS GRAFT  2001   2 vessels   FIDUCIAL MARKER PLACEMENT  07/02/2021   Procedure: FIDUCIAL MARKER PLACEMENT;  Surgeon: Collene Gobble, MD;  Location: [facility] ENDOSCOPY;  Service: Pulmonary;;   fracture heel  1967   Bil/ due to fall off ladder   Fractured calcaneus  1969   bilaterally w/ ankle fusion   [place]   right inguinal hernia   KNEE SURGERY Left    LOBECTOMY Right 11/13/2021   MENISECTOMY  2007   R medial, Dr. Lorin Mercy   PORTA CATH INSERTION     SHOULDER SURGERY  1994   left   UPPER GASTROINTESTINAL ENDOSCOPY  2007   GERD, Dr Sharlett Iles   VASECTOMY     VIDEO BRONCHOSCOPY WITH RADIAL ENDOBRONCHIAL ULTRASOUND  07/02/2021   Procedure: VIDEO BRONCHOSCOPY WITH RADIAL ENDOBRONCHIAL ULTRASOUND;  Surgeon: Collene Gobble, MD;  Location: [facility] ENDOSCOPY;  Service: Pulmonary;;    REVIEW OF SYSTEMS:   Review of Systems  Constitutional: Negative for appetite change, chills, fatigue, fever and unexpected weight change.  HENT:   Negative for mouth sores, nosebleeds, sore throat and trouble swallowing.   Eyes: Negative for eye problems and icterus.  Respiratory: Negative for cough, hemoptysis, shortness of breath and wheezing.   Cardiovascular: Negative for chest pain and leg swelling.   Gastrointestinal: Negative for abdominal pain, constipation, diarrhea, nausea and vomiting.  Genitourinary: Negative for bladder incontinence, difficulty urinating, dysuria, frequency and hematuria.   Musculoskeletal: Negative for back pain, gait problem, neck pain and neck stiffness.  Skin: Negative for itching and rash.  Neurological: Negative for dizziness, extremity weakness, gait problem, headaches, light-headedness and seizures.  Hematological: Negative for adenopathy. Does not bruise/bleed easily.  Psychiatric/Behavioral: Negative for confusion, depression and sleep disturbance. The patient is not nervous/anxious.     PHYSICAL EXAMINATION:  Blood pressure 137/65, pulse 63, temperature 97.7 F (36.5 C), resp. rate 20, weight 184 lb 14.4 oz (83.9 kg), SpO2 97 %.  ECOG PERFORMANCE STATUS: 1  Physical Exam  Constitutional: Oriented to person, place, and time and well-developed, well-nourished, and in no distress.  HENT:  Head: Normocephalic and atraumatic.  Mouth/Throat: Oropharynx is clear and moist. No oropharyngeal exudate.  Eyes: Conjunctivae are normal. Right eye exhibits no discharge. Left eye exhibits no discharge. No scleral icterus.  Neck: Normal range of motion. Neck supple.  Cardiovascular: Normal rate, regular rhythm, normal heart sounds and intact distal pulses.   Pulmonary/Chest: Effort normal and breath sounds normal. No respiratory distress. No wheezes. No rales.  Abdominal: Soft. Bowel sounds are normal. Exhibits no distension and no mass. There is no tenderness.  Musculoskeletal: Normal range of motion. Exhibits no edema.  Lymphadenopathy:    No cervical adenopathy.  Neurological: Alert and oriented to person, place, and time. Exhibits normal muscle tone. Gait normal. Coordination normal.  Skin: Skin is warm and dry. No rash noted. Not diaphoretic. No erythema. No pallor.  Psychiatric: Mood, memory and judgment normal.  Vitals reviewed.  LABORATORY DATA: Lab  Results  Component Value Date   WBC 5.5 04/08/2022   HGB 12.7 (L) 04/08/2022   HCT 36.7 (L) 04/08/2022   MCV 89.5 04/08/2022   PLT 105 (L) 04/08/2022      Chemistry      Component Value Date/Time   NA 138 04/08/2022 0802   K 4.3 04/08/2022 0802   CL 105 04/08/2022 0802   CO2 27 04/08/2022 0802   BUN 23 04/08/2022 0802   CREATININE 0.96 04/08/2022 0802   CREATININE 1.07 08/25/2019 0847      Component Value Date/Time  CALCIUM 9.7 04/08/2022 0802   ALKPHOS 61 04/08/2022 0802   AST 14 (L) 04/08/2022 0802   ALT 15 04/08/2022 0802   BILITOT 0.4 04/08/2022 0802       RADIOGRAPHIC STUDIES:  MR BRAIN W WO CONTRAST  Result Date: 03/19/2022 CLINICAL DATA:  Provided history: Non-small cell lung cancer, staging. Malignant neoplasm of unspecified part of bronchus or lung. EXAM: MRI HEAD WITHOUT AND WITH CONTRAST TECHNIQUE: Multiplanar, multiecho pulse sequences of the brain and surrounding structures were obtained without and with intravenous contrast. CONTRAST:  60mL GADAVIST GADOBUTROL 1 MMOL/ML IV SOLN COMPARISON:  Brain MRI 07/23/2021. FINDINGS: Brain: Mild generalized cerebral atrophy. No cortical encephalomalacia is identified. No significant cerebral white matter disease for age. There are a few punctate chronic microhemorrhages scattered within the supratentorial brain. Small T2 hyperintense focus within the left thalamus, likely reflecting a prominent perivascular space. There is no acute infarct. No evidence of an intracranial mass. No extra-axial fluid collection. No midline shift. No pathologic intracranial enhancement identified. Vascular: Maintained flow voids within the proximal large arterial vessels. Small developmental venous anomaly within the right frontal lobe (anatomic variant). Skull and upper cervical spine: No focal suspicious marrow lesion. Sinuses/Orbits: No mass or acute finding within the imaged orbits. Prior bilateral ocular lens replacement. Trace mucosal thickening  within bilateral ethmoid air cells. Other: Small-volume fluid within the left mastoid air cells. IMPRESSION: 1. No evidence of intracranial metastatic disease. 2. There are a few punctate chronic microhemorrhages scattered within the supratentorial brain. 3. Mild generalized cerebral atrophy. 4. Small-volume fluid within the left mastoid air cells. Electronically Signed   By: Kellie Simmering D.O.   On: 03/19/2022 07:51     ASSESSMENT/PLAN:  This is a very pleasant 77 year old Caucasian male with recurrent non-small cell lung cancer, adenocarcinoma that was initially diagnosed as multifocal synchronous disease including stage IIIA (T1c, M2, M0) adenocarcinoma in the right upper lobe as well as stage IA (T1c, N0, M0) squamous cell carcinoma involving the right upper lobe and stage IA (T1b, N0, M0) adenocarcinoma involving the left upper lobe diagnosed in June 2023. The patient is status post neoadjuvant systemic chemotherapy with carboplatin, paclitaxel and nivolumab for 3 cycles in [facility] followed by right upper lobectomy with lymph node dissection at [facility] under the care of Dr. Williemae Natter with the final pathology showing residual disease with synchronous adenocarcinoma as well as squamous cell carcinoma in the right upper lobe and multiple mediastinal lymph node involvement performed on November 13, 2021. Unfortunately patient was found to have evidence for disease recurrence with involvement of 4R lymphadenopathy consistent with metastatic adenocarcinoma on February 26, 2022 biopsy. Molecular studies showed no actionable mutations and PD-L1 expression was 15% in the adenocarcinoma in 2% and the squamous cell carcinoma.  The patient has evidence for peripheral neuropathy from the previous chemotherapy and Dr. Julien Nordmann recommended for him concurrent chemotherapy with carboplatin for AUC of 5 and Alimta 500 Mg/M2 every 3 weeks for 2 cycles during the course of radiotherapy. He is status post 1  cycle of treatment and tolerated it well.   Labs were reviewed. Reviewed his labs with Dr. Julien Nordmann. His platelet count is 105k. This is his last cycle of treatment. Dr. Julien Nordmann recommends that he proceed with cycle #2 as scheduled. We will monitor his labs closely on a weekly basis. Discussed we would recommend holding aspirin if his platelet count every gets below 50k. He is ok to take his aspirin at this time with plt  of 105k. Recommend that he proceed with cycle #2 today scheduled.  I will arrange for restaging CT scan approximately 3 weeks after his last radiation.  Will see him back a few days after his CT scan to review the results and discuss the next steps in his care.  Discussed we can always arrange same day lab appointment if needed if he notices increased bleeding/bruising. He comes in daily for radiation and we can try to coordinate his appointments around his radiation times.   His wife was asking about viscous lidocaine. He is scheduled to see Dr. Sondra Come tomorrow. I recommended they discuss with Dr, Sondra Come to see if he thinks he is a candidate for vicious lidocaine or additional recommendations for his esophagitis.   The patient was advised to call immediately if he has any concerning symptoms in the interval. The patient voices understanding of current disease status and treatment options and is in agreement with the current care plan. All questions were answered. The patient knows to call the clinic with any problems, questions or concerns. We can certainly see the patient much sooner if necessary     Orders Placed This Encounter  Procedures   CT Chest W Contrast    Standing Status:   Future    Standing Expiration Date:   04/08/2023    Order Specific Question:   If indicated for the ordered procedure, I authorize the administration of contrast media per Radiology protocol    Answer:   Yes    Order Specific Question:   Does the patient have a contrast media/X-ray dye allergy?     Answer:   No    Order Specific Question:   Preferred imaging location?    Answer:   [facility]   CBC with Differential ([facility] Only)    Standing Status:   Standing    Number of Occurrences:   4    Standing Expiration Date:   04/08/2023   CMP ([facility] only)    Standing Status:   Standing    Number of Occurrences:   4    Standing Expiration Date:   04/08/2023     The total time spent in the appointment was 20-29 minutes.   Kailer Heindel L Marquel Spoto, PA-C 04/08/22

## 2022-04-05 NOTE — Telephone Encounter (Signed)
Called patient regarding upcoming [facility] appointments, patient is notified. 

## 2022-04-08 ENCOUNTER — Other Ambulatory Visit: Payer: Self-pay

## 2022-04-08 ENCOUNTER — Inpatient Hospital Stay (HOSPITAL_BASED_OUTPATIENT_CLINIC_OR_DEPARTMENT_OTHER): Payer: Medicare Other

## 2022-04-08 ENCOUNTER — Inpatient Hospital Stay: Payer: Medicare Other | Admitting: Physician Assistant

## 2022-04-08 ENCOUNTER — Inpatient Hospital Stay: Payer: Medicare Other

## 2022-04-08 ENCOUNTER — Ambulatory Visit
Admission: RE | Admit: 2022-04-08 | Discharge: 2022-04-08 | Disposition: A | Discharge status: Home / Self Care | Payer: Medicare Other | Source: Ambulatory Visit | Attending: Radiation Oncology | Admitting: Radiation Oncology

## 2022-04-08 VITALS — BP 137/65 | HR 63 | Temp 97.7°F | Resp 20 | Wt 184.9 lb

## 2022-04-08 DIAGNOSIS — Z87891 Personal history of nicotine dependence: Secondary | ICD-10-CM | POA: Diagnosis not present

## 2022-04-08 DIAGNOSIS — C3491 Malignant neoplasm of unspecified part of right bronchus or lung: Secondary | ICD-10-CM

## 2022-04-08 DIAGNOSIS — K2100 Gastro-esophageal reflux disease with esophagitis, without bleeding: Secondary | ICD-10-CM | POA: Insufficient documentation

## 2022-04-08 DIAGNOSIS — C3411 Malignant neoplasm of upper lobe, right bronchus or lung: Secondary | ICD-10-CM | POA: Insufficient documentation

## 2022-04-08 DIAGNOSIS — G620 Drug-induced polyneuropathy: Secondary | ICD-10-CM | POA: Insufficient documentation

## 2022-04-08 DIAGNOSIS — R918 Other nonspecific abnormal finding of lung field: Secondary | ICD-10-CM | POA: Insufficient documentation

## 2022-04-08 DIAGNOSIS — Z5111 Encounter for antineoplastic chemotherapy: Secondary | ICD-10-CM | POA: Insufficient documentation

## 2022-04-08 DIAGNOSIS — Z510 Encounter for antineoplastic radiation therapy: Secondary | ICD-10-CM | POA: Diagnosis not present

## 2022-04-08 DIAGNOSIS — R300 Dysuria: Secondary | ICD-10-CM | POA: Insufficient documentation

## 2022-04-08 LAB — RAD ONC ARIA SESSION SUMMARY
Course Elapsed Days: 20
Plan Fractions Treated to Date: 15
Plan Prescribed Dose Per Fraction: 2 Gy
Plan Total Fractions Prescribed: 30
Plan Total Prescribed Dose: 60 Gy
Reference Point Dosage Given to Date: 30 Gy
Reference Point Session Dosage Given: 2 Gy
Session Number: 15

## 2022-04-08 LAB — CBC WITH DIFFERENTIAL (CANCER CENTER ONLY)
Abs Immature Granulocytes: 0.03 10*3/uL (ref 0.00–0.07)
Basophils Absolute: 0 10*3/uL (ref 0.0–0.1)
Basophils Relative: 0 %
Eosinophils Absolute: 0 10*3/uL (ref 0.0–0.5)
Eosinophils Relative: 0 %
HCT: 36.7 % — ABNORMAL LOW (ref 39.0–52.0)
Hemoglobin: 12.7 g/dL — ABNORMAL LOW (ref 13.0–17.0)
Immature Granulocytes: 1 %
Lymphocytes Relative: 7 %
Lymphs Abs: 0.4 10*3/uL — ABNORMAL LOW (ref 0.7–4.0)
MCH: 31 pg (ref 26.0–34.0)
MCHC: 34.6 g/dL (ref 30.0–36.0)
MCV: 89.5 fL (ref 80.0–100.0)
Monocytes Absolute: 0.6 10*3/uL (ref 0.1–1.0)
Monocytes Relative: 11 %
Neutro Abs: 4.5 10*3/uL (ref 1.7–7.7)
Neutrophils Relative %: 81 %
Platelet Count: 105 10*3/uL — ABNORMAL LOW (ref 150–400)
RBC: 4.1 MIL/uL — ABNORMAL LOW (ref 4.22–5.81)
RDW: 13.6 % (ref 11.5–15.5)
WBC Count: 5.5 10*3/uL (ref 4.0–10.5)
nRBC: 0 % (ref 0.0–0.2)

## 2022-04-08 LAB — CMP (CANCER CENTER ONLY)
ALT: 15 U/L (ref 0–44)
AST: 14 U/L — ABNORMAL LOW (ref 15–41)
Albumin: 4 g/dL (ref 3.5–5.0)
Alkaline Phosphatase: 61 U/L (ref 38–126)
Anion gap: 6 (ref 5–15)
BUN: 23 mg/dL (ref 8–23)
CO2: 27 mmol/L (ref 22–32)
Calcium: 9.7 mg/dL (ref 8.9–10.3)
Chloride: 105 mmol/L (ref 98–111)
Creatinine: 0.96 mg/dL (ref 0.61–1.24)
GFR, Estimated: >60 mL/min (ref >60)
Glucose, Bld: 125 mg/dL — ABNORMAL HIGH (ref 70–99)
Potassium: 4.3 mmol/L (ref 3.5–5.1)
Sodium: 138 mmol/L (ref 135–145)
Total Bilirubin: 0.4 mg/dL (ref 0.3–1.2)
Total Protein: 6.9 g/dL (ref 6.5–8.1)

## 2022-04-08 MED ORDER — SODIUM CHLORIDE 0.9 % IV SOLN
500.0000 mg/m2 | Freq: Once | INTRAVENOUS | Status: AC
  Administered 2022-04-08: 1000 mg via INTRAVENOUS
  Filled 2022-04-08: qty 40

## 2022-04-08 MED ORDER — SODIUM CHLORIDE 0.9% FLUSH
10.0000 mL | INTRAVENOUS | Status: DC | PRN
  Administered 2022-04-08: 10 mL

## 2022-04-08 MED ORDER — SODIUM CHLORIDE 0.9 % IV SOLN
150.0000 mg | Freq: Once | INTRAVENOUS | Status: AC
  Administered 2022-04-08: 150 mg via INTRAVENOUS
  Filled 2022-04-08: qty 150

## 2022-04-08 MED ORDER — PALONOSETRON HCL INJECTION 0.25 MG/5ML
0.2500 mg | Freq: Once | INTRAVENOUS | Status: AC
  Administered 2022-04-08: 0.25 mg via INTRAVENOUS
  Filled 2022-04-08: qty 5

## 2022-04-08 MED ORDER — SODIUM CHLORIDE 0.9 % IV SOLN
10.0000 mg | Freq: Once | INTRAVENOUS | Status: AC
  Administered 2022-04-08: 10 mg via INTRAVENOUS
  Filled 2022-04-08: qty 10

## 2022-04-08 MED ORDER — HEPARIN SOD (PORK) LOCK FLUSH 100 UNIT/ML IV SOLN
500.0000 [IU] | Freq: Once | INTRAVENOUS | Status: AC | PRN
  Administered 2022-04-08: 500 [IU]

## 2022-04-08 MED ORDER — SODIUM CHLORIDE 0.9 % IV SOLN
479.5000 mg | Freq: Once | INTRAVENOUS | Status: AC
  Administered 2022-04-08: 480 mg via INTRAVENOUS
  Filled 2022-04-08: qty 48

## 2022-04-08 MED ORDER — SODIUM CHLORIDE 0.9 % IV SOLN: Freq: Once | INTRAVENOUS | Status: AC

## 2022-04-08 NOTE — Patient Instructions (Signed)
[facility]  Discharge Instructions: Thank you for choosing [facility] to provide your oncology and hematology care.   If you have a lab appointment with the [facility], please go directly to the [facility] and check in at the registration area.   Wear comfortable clothing and clothing appropriate for easy access to any Portacath or PICC line.   We strive to give you quality time with your provider. You may need to reschedule your appointment if you arrive late (15 or more minutes).  Arriving late affects you and other patients whose appointments are after yours.  Also, if you miss three or more appointments without notifying the office, you may be dismissed from the clinic at the provider's discretion.      For prescription refill requests, have your pharmacy contact our office and allow 72 hours for refills to be completed.    Today you received the following chemotherapy and/or immunotherapy agents: pemetrexed and carboplatin      To help prevent nausea and vomiting after your treatment, we encourage you to take your nausea medication as directed.  BELOW ARE SYMPTOMS THAT SHOULD BE REPORTED IMMEDIATELY: *FEVER GREATER THAN 100.4 F (38 C) OR HIGHER *CHILLS OR SWEATING *NAUSEA AND VOMITING THAT IS NOT CONTROLLED WITH YOUR NAUSEA MEDICATION *UNUSUAL SHORTNESS OF BREATH *UNUSUAL BRUISING OR BLEEDING *URINARY PROBLEMS (pain or burning when urinating, or frequent urination) *BOWEL PROBLEMS (unusual diarrhea, constipation, pain near the anus) TENDERNESS IN MOUTH AND THROAT WITH OR WITHOUT PRESENCE OF ULCERS (sore throat, sores in mouth, or a toothache) UNUSUAL RASH, SWELLING OR PAIN  UNUSUAL VAGINAL DISCHARGE OR ITCHING   Items with * indicate a potential emergency and should be followed up as soon as possible or go to the Emergency Department if any problems should occur.  Please show the CHEMOTHERAPY ALERT CARD or IMMUNOTHERAPY  ALERT CARD at check-in to the Emergency Department and triage nurse.  Should you have questions after your visit or need to cancel or reschedule your appointment, please contact [facility]  Dept: [phone]  and follow the prompts.  Office hours are 8:00 a.m. to 4:30 p.m. Monday - Friday. Please note that voicemails left after 4:00 p.m. may not be returned until the following business day.  We are closed weekends and major holidays. You have access to a nurse at all times for urgent questions. Please call the main number to the clinic Dept: [phone] and follow the prompts.   For any non-urgent questions, you may also contact your provider using MyChart. We now offer e-Visits for anyone 4 and older to request care online for non-urgent symptoms. For details visit [email or web address].   Also download the MyChart app! Go to the app store, search "MyChart", open the app, select [facility], and log in with your MyChart username and password.

## 2022-04-09 ENCOUNTER — Encounter (HOSPITAL_COMMUNITY)
Admission: RE | Admit: 2022-04-09 | Discharge: 2022-04-09 | Disposition: A | Discharge status: Home / Self Care | Payer: Medicare Other | Source: Ambulatory Visit | Attending: Thoracic Surgery (Cardiothoracic Vascular Surgery) | Admitting: Thoracic Surgery (Cardiothoracic Vascular Surgery)

## 2022-04-09 ENCOUNTER — Other Ambulatory Visit: Payer: Self-pay | Admitting: Radiation Oncology

## 2022-04-09 ENCOUNTER — Ambulatory Visit
Admission: RE | Admit: 2022-04-09 | Discharge: 2022-04-09 | Disposition: A | Discharge status: Home / Self Care | Payer: Medicare Other | Source: Ambulatory Visit | Attending: Radiation Oncology | Admitting: Radiation Oncology

## 2022-04-09 ENCOUNTER — Other Ambulatory Visit: Payer: Self-pay

## 2022-04-09 ENCOUNTER — Telehealth: Payer: Self-pay | Admitting: Internal Medicine

## 2022-04-09 DIAGNOSIS — C3491 Malignant neoplasm of unspecified part of right bronchus or lung: Secondary | ICD-10-CM | POA: Diagnosis not present

## 2022-04-09 DIAGNOSIS — C3411 Malignant neoplasm of upper lobe, right bronchus or lung: Secondary | ICD-10-CM

## 2022-04-09 DIAGNOSIS — R300 Dysuria: Secondary | ICD-10-CM | POA: Diagnosis not present

## 2022-04-09 DIAGNOSIS — Z510 Encounter for antineoplastic radiation therapy: Secondary | ICD-10-CM | POA: Diagnosis not present

## 2022-04-09 DIAGNOSIS — Z87891 Personal history of nicotine dependence: Secondary | ICD-10-CM | POA: Diagnosis not present

## 2022-04-09 DIAGNOSIS — C3412 Malignant neoplasm of upper lobe, left bronchus or lung: Secondary | ICD-10-CM

## 2022-04-09 DIAGNOSIS — R918 Other nonspecific abnormal finding of lung field: Secondary | ICD-10-CM | POA: Diagnosis not present

## 2022-04-09 LAB — RAD ONC ARIA SESSION SUMMARY
Course Elapsed Days: 21
Plan Fractions Treated to Date: 16
Plan Prescribed Dose Per Fraction: 2 Gy
Plan Total Fractions Prescribed: 30
Plan Total Prescribed Dose: 60 Gy
Reference Point Dosage Given to Date: 32 Gy
Reference Point Session Dosage Given: 2 Gy
Session Number: 16

## 2022-04-09 MED ORDER — LIDOCAINE VISCOUS HCL 2 % MT SOLN: 15.0000 mL | OROMUCOSAL | 2 refills | Status: DC | PRN

## 2022-04-09 MED ORDER — SONAFINE EX EMUL
1.0000 | Freq: Once | CUTANEOUS | Status: AC
  Administered 2022-04-09: 1 via TOPICAL

## 2022-04-09 NOTE — Telephone Encounter (Signed)
Scheduled per 04/01 los, patient has been called and notified of upcoming appointments.

## 2022-04-09 NOTE — Progress Notes (Signed)
Daily Session Note  Patient Details  Name: Patrick Harris MRN: [id number] Date of Birth: Apr 09, 1945 Referring Provider:   [facility] from 12/27/2021 in [place]  Referring Provider Dr. Williemae Natter       Encounter Date: 04/09/2022  Check In:  Session Check In - 04/09/22 1330       Check-In   Supervising physician immediately available to respond to emergencies [facility] MD immediately available    Physician(s) Dr Dellia Cloud    Location [facility]    Staff Present Leana Roe, BS, Exercise Physiologist;Phyllis Billingsley, RN;Daryl Beehler Hassell Done, RN, BSN    Virtual Visit No    Medication changes reported     No    Fall or balance concerns reported    No    Tobacco Cessation No Change    Warm-up and Cool-down Performed as group-led instruction    Resistance Training Performed Yes      Pain Assessment   Currently in Pain? No/denies    Pain Score 0-No pain    Multiple Pain Sites No             Capillary Blood Glucose: No results found for this or any previous visit (from the past 24 hour(s)).    Social History   Tobacco Use  Smoking Status Former   Packs/day: .25   Types: Cigarettes   Quit date: 05/2021   Years since quitting: 0.9  Smokeless Tobacco Never    Goals Met:  Proper associated with RPD/PD & O2 Sat Independence with exercise equipment Using PLB without cueing & demonstrates good technique Exercise tolerated well Queuing for purse lip breathing No report of concerns or symptoms today Strength training completed today  Goals Unmet:  Not Applicable  Comments: Checkout at 1430.   Dr. Kathie Dike is Medical Director for [facility] Pulmonary Re[facility].

## 2022-04-10 ENCOUNTER — Ambulatory Visit
Admission: RE | Admit: 2022-04-10 | Discharge: 2022-04-10 | Disposition: A | Discharge status: Home / Self Care | Payer: Medicare Other | Source: Ambulatory Visit | Attending: Radiation Oncology | Admitting: Radiation Oncology

## 2022-04-10 ENCOUNTER — Other Ambulatory Visit: Payer: Self-pay

## 2022-04-10 DIAGNOSIS — C3411 Malignant neoplasm of upper lobe, right bronchus or lung: Secondary | ICD-10-CM | POA: Diagnosis not present

## 2022-04-10 DIAGNOSIS — R300 Dysuria: Secondary | ICD-10-CM | POA: Diagnosis not present

## 2022-04-10 DIAGNOSIS — R918 Other nonspecific abnormal finding of lung field: Secondary | ICD-10-CM | POA: Diagnosis not present

## 2022-04-10 DIAGNOSIS — Z510 Encounter for antineoplastic radiation therapy: Secondary | ICD-10-CM | POA: Diagnosis not present

## 2022-04-10 DIAGNOSIS — Z87891 Personal history of nicotine dependence: Secondary | ICD-10-CM | POA: Diagnosis not present

## 2022-04-10 DIAGNOSIS — C3491 Malignant neoplasm of unspecified part of right bronchus or lung: Secondary | ICD-10-CM | POA: Diagnosis not present

## 2022-04-10 LAB — RAD ONC ARIA SESSION SUMMARY
Course Elapsed Days: 22
Plan Fractions Treated to Date: 17
Plan Prescribed Dose Per Fraction: 2 Gy
Plan Total Fractions Prescribed: 30
Plan Total Prescribed Dose: 60 Gy
Reference Point Dosage Given to Date: 34 Gy
Reference Point Session Dosage Given: 2 Gy
Session Number: 17

## 2022-04-11 ENCOUNTER — Encounter: Payer: Self-pay | Admitting: Internal Medicine

## 2022-04-11 ENCOUNTER — Ambulatory Visit
Admission: RE | Admit: 2022-04-11 | Discharge: 2022-04-11 | Disposition: A | Discharge status: Home / Self Care | Payer: Medicare Other | Source: Ambulatory Visit | Attending: Radiation Oncology | Admitting: Radiation Oncology

## 2022-04-11 ENCOUNTER — Other Ambulatory Visit: Payer: Self-pay

## 2022-04-11 ENCOUNTER — Ambulatory Visit: Payer: Medicare Other | Admitting: Cardiology

## 2022-04-11 ENCOUNTER — Encounter (HOSPITAL_COMMUNITY)
Admission: RE | Admit: 2022-04-11 | Discharge: 2022-04-11 | Disposition: A | Discharge status: Home / Self Care | Payer: Medicare Other | Source: Ambulatory Visit | Attending: Thoracic Surgery (Cardiothoracic Vascular Surgery) | Admitting: Thoracic Surgery (Cardiothoracic Vascular Surgery)

## 2022-04-11 DIAGNOSIS — R300 Dysuria: Secondary | ICD-10-CM | POA: Diagnosis not present

## 2022-04-11 DIAGNOSIS — C3412 Malignant neoplasm of upper lobe, left bronchus or lung: Secondary | ICD-10-CM

## 2022-04-11 DIAGNOSIS — R918 Other nonspecific abnormal finding of lung field: Secondary | ICD-10-CM | POA: Diagnosis not present

## 2022-04-11 DIAGNOSIS — Z87891 Personal history of nicotine dependence: Secondary | ICD-10-CM | POA: Diagnosis not present

## 2022-04-11 DIAGNOSIS — C3411 Malignant neoplasm of upper lobe, right bronchus or lung: Secondary | ICD-10-CM

## 2022-04-11 DIAGNOSIS — Z510 Encounter for antineoplastic radiation therapy: Secondary | ICD-10-CM | POA: Diagnosis not present

## 2022-04-11 DIAGNOSIS — C3491 Malignant neoplasm of unspecified part of right bronchus or lung: Secondary | ICD-10-CM | POA: Diagnosis not present

## 2022-04-11 LAB — RAD ONC ARIA SESSION SUMMARY
Course Elapsed Days: 23
Plan Fractions Treated to Date: 18
Plan Prescribed Dose Per Fraction: 2 Gy
Plan Total Fractions Prescribed: 30
Plan Total Prescribed Dose: 60 Gy
Reference Point Dosage Given to Date: 36 Gy
Reference Point Session Dosage Given: 2 Gy
Session Number: 18

## 2022-04-11 NOTE — Progress Notes (Signed)
Daily Session Note  Patient Details  Name: Patrick Harris MRN: [id number] Date of Birth: July 24, 1945 Referring Provider:   [facility] from 12/27/2021 in [place]  Referring Provider Dr. Williemae Natter       Encounter Date: 04/11/2022  Check In:  Session Check In - 04/11/22 1322       Check-In   Supervising physician immediately available to respond to emergencies [facility] MD immediately available    Physician(s) Dr Dellia Cloud    Location [facility]    Staff Present Leana Roe, BS, Exercise Physiologist;[place] Perra Hassell Done, RN, BSN;[place] Sherrie George, MS, ACSM-CEP    Virtual Visit No    Medication changes reported     No    Fall or balance concerns reported    No    Tobacco Cessation No Change    Warm-up and Cool-down Performed as group-led instruction    Resistance Training Performed Yes      Pain Assessment   Currently in Pain? No/denies    Pain Score 0-No pain    Multiple Pain Sites No             Capillary Blood Glucose: No results found for this or any previous visit (from the past 24 hour(s)).    Social History   Tobacco Use  Smoking Status Former   Packs/day: .25   Types: Cigarettes   Quit date: 05/2021   Years since quitting: 0.9  Smokeless Tobacco Never    Goals Met:  Proper associated with RPD/PD & O2 Sat Independence with exercise equipment Using PLB without cueing & demonstrates good technique Exercise tolerated well Queuing for purse lip breathing No report of concerns or symptoms today Strength training completed today  Goals Unmet:  Not Applicable  Comments: Checkout at 1430.   Dr. Kathie Dike is Medical Director for [facility] Pulmonary Rehab.

## 2022-04-12 ENCOUNTER — Other Ambulatory Visit: Payer: Self-pay

## 2022-04-12 ENCOUNTER — Ambulatory Visit
Admission: RE | Admit: 2022-04-12 | Discharge: 2022-04-12 | Disposition: A | Discharge status: Home / Self Care | Payer: Medicare Other | Source: Ambulatory Visit | Attending: Radiation Oncology | Admitting: Radiation Oncology

## 2022-04-12 DIAGNOSIS — R300 Dysuria: Secondary | ICD-10-CM | POA: Diagnosis not present

## 2022-04-12 DIAGNOSIS — R918 Other nonspecific abnormal finding of lung field: Secondary | ICD-10-CM | POA: Diagnosis not present

## 2022-04-12 DIAGNOSIS — C3491 Malignant neoplasm of unspecified part of right bronchus or lung: Secondary | ICD-10-CM | POA: Diagnosis not present

## 2022-04-12 DIAGNOSIS — Z87891 Personal history of nicotine dependence: Secondary | ICD-10-CM | POA: Diagnosis not present

## 2022-04-12 DIAGNOSIS — C3411 Malignant neoplasm of upper lobe, right bronchus or lung: Secondary | ICD-10-CM | POA: Diagnosis not present

## 2022-04-12 DIAGNOSIS — Z510 Encounter for antineoplastic radiation therapy: Secondary | ICD-10-CM | POA: Diagnosis not present

## 2022-04-12 LAB — RAD ONC ARIA SESSION SUMMARY
Course Elapsed Days: 24
Plan Fractions Treated to Date: 19
Plan Prescribed Dose Per Fraction: 2 Gy
Plan Total Fractions Prescribed: 30
Plan Total Prescribed Dose: 60 Gy
Reference Point Dosage Given to Date: 38 Gy
Reference Point Session Dosage Given: 2 Gy
Session Number: 19

## 2022-04-15 ENCOUNTER — Ambulatory Visit
Admission: RE | Admit: 2022-04-15 | Discharge: 2022-04-15 | Disposition: A | Discharge status: Home / Self Care | Payer: Medicare Other | Source: Ambulatory Visit | Attending: Radiation Oncology | Admitting: Radiation Oncology

## 2022-04-15 ENCOUNTER — Other Ambulatory Visit: Payer: Self-pay

## 2022-04-15 DIAGNOSIS — Z510 Encounter for antineoplastic radiation therapy: Secondary | ICD-10-CM | POA: Diagnosis not present

## 2022-04-15 DIAGNOSIS — R300 Dysuria: Secondary | ICD-10-CM | POA: Diagnosis not present

## 2022-04-15 DIAGNOSIS — C3491 Malignant neoplasm of unspecified part of right bronchus or lung: Secondary | ICD-10-CM | POA: Diagnosis not present

## 2022-04-15 DIAGNOSIS — C3411 Malignant neoplasm of upper lobe, right bronchus or lung: Secondary | ICD-10-CM | POA: Diagnosis not present

## 2022-04-15 DIAGNOSIS — Z87891 Personal history of nicotine dependence: Secondary | ICD-10-CM | POA: Diagnosis not present

## 2022-04-15 DIAGNOSIS — R918 Other nonspecific abnormal finding of lung field: Secondary | ICD-10-CM | POA: Diagnosis not present

## 2022-04-15 LAB — RAD ONC ARIA SESSION SUMMARY
Course Elapsed Days: 27
Plan Fractions Treated to Date: 20
Plan Prescribed Dose Per Fraction: 2 Gy
Plan Total Fractions Prescribed: 30
Plan Total Prescribed Dose: 60 Gy
Reference Point Dosage Given to Date: 40 Gy
Reference Point Session Dosage Given: 2 Gy
Session Number: 20

## 2022-04-16 ENCOUNTER — Ambulatory Visit
Admission: RE | Admit: 2022-04-16 | Discharge: 2022-04-16 | Disposition: A | Discharge status: Home / Self Care | Payer: Medicare Other | Source: Ambulatory Visit | Attending: Radiation Oncology | Admitting: Radiation Oncology

## 2022-04-16 ENCOUNTER — Encounter (HOSPITAL_COMMUNITY): Payer: Medicare Other

## 2022-04-16 ENCOUNTER — Inpatient Hospital Stay: Payer: Medicare Other

## 2022-04-16 ENCOUNTER — Other Ambulatory Visit: Payer: Self-pay | Admitting: Radiation Oncology

## 2022-04-16 ENCOUNTER — Other Ambulatory Visit: Payer: Self-pay

## 2022-04-16 VITALS — BP 120/64 | HR 60 | Temp 98.5°F | Resp 16 | Wt 180.8 lb

## 2022-04-16 DIAGNOSIS — R918 Other nonspecific abnormal finding of lung field: Secondary | ICD-10-CM

## 2022-04-16 DIAGNOSIS — C3491 Malignant neoplasm of unspecified part of right bronchus or lung: Secondary | ICD-10-CM

## 2022-04-16 DIAGNOSIS — Z87891 Personal history of nicotine dependence: Secondary | ICD-10-CM | POA: Diagnosis not present

## 2022-04-16 DIAGNOSIS — C3411 Malignant neoplasm of upper lobe, right bronchus or lung: Secondary | ICD-10-CM | POA: Diagnosis not present

## 2022-04-16 DIAGNOSIS — Z95828 Presence of other vascular implants and grafts: Secondary | ICD-10-CM

## 2022-04-16 DIAGNOSIS — Z510 Encounter for antineoplastic radiation therapy: Secondary | ICD-10-CM | POA: Diagnosis not present

## 2022-04-16 DIAGNOSIS — R300 Dysuria: Secondary | ICD-10-CM | POA: Diagnosis not present

## 2022-04-16 LAB — CBC WITH DIFFERENTIAL (CANCER CENTER ONLY)
Abs Immature Granulocytes: 0.02 10*3/uL (ref 0.00–0.07)
Basophils Absolute: 0 10*3/uL (ref 0.0–0.1)
Basophils Relative: 2 %
Eosinophils Absolute: 0 10*3/uL (ref 0.0–0.5)
Eosinophils Relative: 1 %
HCT: 38.2 % — ABNORMAL LOW (ref 39.0–52.0)
Hemoglobin: 13.5 g/dL (ref 13.0–17.0)
Immature Granulocytes: 1 %
Lymphocytes Relative: 7 %
Lymphs Abs: 0.1 10*3/uL — ABNORMAL LOW (ref 0.7–4.0)
MCH: 31.5 pg (ref 26.0–34.0)
MCHC: 35.3 g/dL (ref 30.0–36.0)
MCV: 89 fL (ref 80.0–100.0)
Monocytes Absolute: 0.9 10*3/uL (ref 0.1–1.0)
Monocytes Relative: 44 %
Neutro Abs: 0.9 10*3/uL — ABNORMAL LOW (ref 1.7–7.7)
Neutrophils Relative %: 45 %
Platelet Count: 88 10*3/uL — ABNORMAL LOW (ref 150–400)
RBC: 4.29 MIL/uL (ref 4.22–5.81)
RDW: 14.2 % (ref 11.5–15.5)
Smear Review: NORMAL
WBC Count: 1.9 10*3/uL — ABNORMAL LOW (ref 4.0–10.5)
nRBC: 0 % (ref 0.0–0.2)

## 2022-04-16 LAB — CMP (CANCER CENTER ONLY)
ALT: 22 U/L (ref 0–44)
AST: 15 U/L (ref 15–41)
Albumin: 3.8 g/dL (ref 3.5–5.0)
Alkaline Phosphatase: 57 U/L (ref 38–126)
Anion gap: 7 (ref 5–15)
BUN: 20 mg/dL (ref 8–23)
CO2: 25 mmol/L (ref 22–32)
Calcium: 9.3 mg/dL (ref 8.9–10.3)
Chloride: 101 mmol/L (ref 98–111)
Creatinine: 1.09 mg/dL (ref 0.61–1.24)
GFR, Estimated: >60 mL/min (ref >60)
Glucose, Bld: 119 mg/dL — ABNORMAL HIGH (ref 70–99)
Potassium: 4.3 mmol/L (ref 3.5–5.1)
Sodium: 133 mmol/L — ABNORMAL LOW (ref 135–145)
Total Bilirubin: 0.8 mg/dL (ref 0.3–1.2)
Total Protein: 7.2 g/dL (ref 6.5–8.1)

## 2022-04-16 LAB — RAD ONC ARIA SESSION SUMMARY
Course Elapsed Days: 28
Plan Fractions Treated to Date: 21
Plan Prescribed Dose Per Fraction: 2 Gy
Plan Total Fractions Prescribed: 30
Plan Total Prescribed Dose: 60 Gy
Reference Point Dosage Given to Date: 42 Gy
Reference Point Session Dosage Given: 2 Gy
Session Number: 21

## 2022-04-16 MED ORDER — HEPARIN SOD (PORK) LOCK FLUSH 100 UNIT/ML IV SOLN
500.0000 [IU] | Freq: Once | INTRAVENOUS | Status: AC
  Administered 2022-04-16: 500 [IU] via INTRAVENOUS

## 2022-04-16 MED ORDER — SODIUM CHLORIDE 0.9 % IV SOLN: Freq: Once | INTRAVENOUS | Status: AC

## 2022-04-16 MED ORDER — OXYCODONE-ACETAMINOPHEN 5-325 MG PO TABS: 1.0000 | ORAL_TABLET | Freq: Four times a day (QID) | ORAL | 0 refills | Status: DC | PRN

## 2022-04-16 MED ORDER — SODIUM CHLORIDE 0.9% FLUSH
10.0000 mL | Freq: Once | INTRAVENOUS | Status: AC
  Administered 2022-04-16: 10 mL via INTRAVENOUS

## 2022-04-16 NOTE — Patient Instructions (Signed)

## 2022-04-17 ENCOUNTER — Inpatient Hospital Stay: Payer: Medicare Other

## 2022-04-17 ENCOUNTER — Other Ambulatory Visit: Payer: Self-pay

## 2022-04-17 ENCOUNTER — Ambulatory Visit
Admission: RE | Admit: 2022-04-17 | Discharge: 2022-04-17 | Disposition: A | Discharge status: Home / Self Care | Payer: Medicare Other | Source: Ambulatory Visit | Attending: Radiation Oncology | Admitting: Radiation Oncology

## 2022-04-17 VITALS — BP 120/58 | HR 77 | Temp 97.8°F | Resp 16

## 2022-04-17 DIAGNOSIS — R300 Dysuria: Secondary | ICD-10-CM | POA: Diagnosis not present

## 2022-04-17 DIAGNOSIS — R918 Other nonspecific abnormal finding of lung field: Secondary | ICD-10-CM

## 2022-04-17 DIAGNOSIS — C3491 Malignant neoplasm of unspecified part of right bronchus or lung: Secondary | ICD-10-CM | POA: Diagnosis not present

## 2022-04-17 DIAGNOSIS — C3411 Malignant neoplasm of upper lobe, right bronchus or lung: Secondary | ICD-10-CM | POA: Diagnosis not present

## 2022-04-17 DIAGNOSIS — Z87891 Personal history of nicotine dependence: Secondary | ICD-10-CM | POA: Diagnosis not present

## 2022-04-17 DIAGNOSIS — Z95828 Presence of other vascular implants and grafts: Secondary | ICD-10-CM

## 2022-04-17 DIAGNOSIS — Z510 Encounter for antineoplastic radiation therapy: Secondary | ICD-10-CM | POA: Diagnosis not present

## 2022-04-17 LAB — RAD ONC ARIA SESSION SUMMARY
Course Elapsed Days: 29
Plan Fractions Treated to Date: 22
Plan Prescribed Dose Per Fraction: 2 Gy
Plan Total Fractions Prescribed: 30
Plan Total Prescribed Dose: 60 Gy
Reference Point Dosage Given to Date: 44 Gy
Reference Point Session Dosage Given: 2 Gy
Session Number: 22

## 2022-04-17 MED ORDER — SODIUM CHLORIDE 0.9 % IV SOLN: INTRAVENOUS | Status: AC

## 2022-04-17 MED ORDER — SODIUM CHLORIDE 0.9% FLUSH
10.0000 mL | Freq: Once | INTRAVENOUS | Status: AC
  Administered 2022-04-17: 10 mL via INTRAVENOUS

## 2022-04-17 MED ORDER — HEPARIN SOD (PORK) LOCK FLUSH 100 UNIT/ML IV SOLN
500.0000 [IU] | Freq: Once | INTRAVENOUS | Status: AC
  Administered 2022-04-17: 500 [IU] via INTRAVENOUS

## 2022-04-17 NOTE — Patient Instructions (Signed)

## 2022-04-18 ENCOUNTER — Ambulatory Visit
Admission: RE | Admit: 2022-04-18 | Discharge: 2022-04-18 | Disposition: A | Discharge status: Home / Self Care | Payer: Medicare Other | Source: Ambulatory Visit | Attending: Radiation Oncology | Admitting: Radiation Oncology

## 2022-04-18 ENCOUNTER — Encounter (HOSPITAL_COMMUNITY): Payer: Medicare Other

## 2022-04-18 ENCOUNTER — Other Ambulatory Visit: Payer: Self-pay

## 2022-04-18 ENCOUNTER — Other Ambulatory Visit: Payer: Self-pay | Admitting: Radiation Oncology

## 2022-04-18 ENCOUNTER — Inpatient Hospital Stay: Payer: Medicare Other

## 2022-04-18 DIAGNOSIS — R918 Other nonspecific abnormal finding of lung field: Secondary | ICD-10-CM | POA: Diagnosis not present

## 2022-04-18 DIAGNOSIS — Z510 Encounter for antineoplastic radiation therapy: Secondary | ICD-10-CM | POA: Diagnosis not present

## 2022-04-18 DIAGNOSIS — C3491 Malignant neoplasm of unspecified part of right bronchus or lung: Secondary | ICD-10-CM

## 2022-04-18 DIAGNOSIS — R300 Dysuria: Secondary | ICD-10-CM | POA: Diagnosis not present

## 2022-04-18 DIAGNOSIS — Z87891 Personal history of nicotine dependence: Secondary | ICD-10-CM | POA: Diagnosis not present

## 2022-04-18 DIAGNOSIS — C3411 Malignant neoplasm of upper lobe, right bronchus or lung: Secondary | ICD-10-CM | POA: Diagnosis not present

## 2022-04-18 LAB — RAD ONC ARIA SESSION SUMMARY
Course Elapsed Days: 30
Plan Fractions Treated to Date: 23
Plan Prescribed Dose Per Fraction: 2 Gy
Plan Total Fractions Prescribed: 30
Plan Total Prescribed Dose: 60 Gy
Reference Point Dosage Given to Date: 46 Gy
Reference Point Session Dosage Given: 2 Gy
Session Number: 23

## 2022-04-18 LAB — URINALYSIS, COMPLETE (UACMP) WITH MICROSCOPIC
Bacteria, UA: NONE SEEN
Bilirubin Urine: NEGATIVE
Glucose, UA: NEGATIVE mg/dL
Hgb urine dipstick: NEGATIVE
Ketones, ur: NEGATIVE mg/dL
Leukocytes,Ua: NEGATIVE
Nitrite: NEGATIVE
Protein, ur: NEGATIVE mg/dL
Specific Gravity, Urine: 1.021 (ref 1.005–1.030)
pH: 6 (ref 5.0–8.0)

## 2022-04-18 MED ORDER — FLUCONAZOLE 100 MG PO TABS: 100.0000 mg | ORAL_TABLET | Freq: Every day | ORAL | 0 refills | Status: DC

## 2022-04-18 MED ORDER — SODIUM CHLORIDE 0.9 % IV SOLN: INTRAVENOUS | Status: AC

## 2022-04-18 MED ORDER — OXYCODONE HCL 15 MG PO TABS: 15.0000 mg | ORAL_TABLET | Freq: Four times a day (QID) | ORAL | 0 refills | Status: DC | PRN

## 2022-04-18 MED ORDER — HYDROMORPHONE HCL 1 MG/ML IJ SOLN
0.5000 mg | Freq: Once | INTRAMUSCULAR | Status: AC
  Administered 2022-04-18: 0.5 mg via INTRAVENOUS
  Filled 2022-04-18: qty 1

## 2022-04-18 NOTE — Patient Instructions (Signed)

## 2022-04-19 ENCOUNTER — Ambulatory Visit: Payer: Medicare Other

## 2022-04-19 ENCOUNTER — Inpatient Hospital Stay: Payer: Medicare Other

## 2022-04-19 VITALS — BP 132/68 | HR 71 | Temp 98.5°F | Resp 16 | Wt 185.1 lb

## 2022-04-19 DIAGNOSIS — R918 Other nonspecific abnormal finding of lung field: Secondary | ICD-10-CM | POA: Diagnosis not present

## 2022-04-19 DIAGNOSIS — Z95828 Presence of other vascular implants and grafts: Secondary | ICD-10-CM

## 2022-04-19 DIAGNOSIS — C3491 Malignant neoplasm of unspecified part of right bronchus or lung: Secondary | ICD-10-CM | POA: Diagnosis not present

## 2022-04-19 DIAGNOSIS — R300 Dysuria: Secondary | ICD-10-CM | POA: Diagnosis not present

## 2022-04-19 LAB — URINE CULTURE: Culture: NO GROWTH

## 2022-04-19 MED ORDER — HEPARIN SOD (PORK) LOCK FLUSH 100 UNIT/ML IV SOLN
500.0000 [IU] | Freq: Once | INTRAVENOUS | Status: AC
  Administered 2022-04-19: 500 [IU] via INTRAVENOUS

## 2022-04-19 MED ORDER — SODIUM CHLORIDE 0.9% FLUSH
10.0000 mL | Freq: Once | INTRAVENOUS | Status: AC
  Administered 2022-04-19: 10 mL via INTRAVENOUS

## 2022-04-19 MED ORDER — HYDROMORPHONE HCL 1 MG/ML IJ SOLN
0.5000 mg | Freq: Once | INTRAMUSCULAR | Status: AC
  Administered 2022-04-19: 0.5 mg via INTRAVENOUS
  Filled 2022-04-19: qty 1

## 2022-04-19 MED ORDER — SODIUM CHLORIDE 0.9 % IV SOLN: INTRAVENOUS | Status: AC

## 2022-04-22 ENCOUNTER — Other Ambulatory Visit: Payer: Self-pay

## 2022-04-22 ENCOUNTER — Ambulatory Visit
Admission: RE | Admit: 2022-04-22 | Discharge: 2022-04-22 | Disposition: A | Discharge status: Home / Self Care | Payer: Medicare Other | Source: Ambulatory Visit | Attending: Radiation Oncology | Admitting: Radiation Oncology

## 2022-04-22 ENCOUNTER — Ambulatory Visit: Payer: Medicare Other

## 2022-04-22 ENCOUNTER — Inpatient Hospital Stay: Payer: Medicare Other

## 2022-04-22 ENCOUNTER — Other Ambulatory Visit: Payer: Self-pay | Admitting: Radiation Oncology

## 2022-04-22 DIAGNOSIS — R300 Dysuria: Secondary | ICD-10-CM | POA: Diagnosis not present

## 2022-04-22 DIAGNOSIS — C3411 Malignant neoplasm of upper lobe, right bronchus or lung: Secondary | ICD-10-CM | POA: Diagnosis not present

## 2022-04-22 DIAGNOSIS — R918 Other nonspecific abnormal finding of lung field: Secondary | ICD-10-CM

## 2022-04-22 DIAGNOSIS — Z87891 Personal history of nicotine dependence: Secondary | ICD-10-CM | POA: Diagnosis not present

## 2022-04-22 DIAGNOSIS — C3491 Malignant neoplasm of unspecified part of right bronchus or lung: Secondary | ICD-10-CM

## 2022-04-22 DIAGNOSIS — Z510 Encounter for antineoplastic radiation therapy: Secondary | ICD-10-CM | POA: Diagnosis not present

## 2022-04-22 LAB — RAD ONC ARIA SESSION SUMMARY
Course Elapsed Days: 34
Plan Fractions Treated to Date: 24
Plan Prescribed Dose Per Fraction: 2 Gy
Plan Total Fractions Prescribed: 30
Plan Total Prescribed Dose: 60 Gy
Reference Point Dosage Given to Date: 48 Gy
Reference Point Session Dosage Given: 2 Gy
Session Number: 24

## 2022-04-22 MED ORDER — LIDOCAINE VISCOUS HCL 2 % MT SOLN: 15.0000 mL | OROMUCOSAL | 2 refills | Status: DC | PRN

## 2022-04-22 MED ORDER — HYDROMORPHONE HCL 1 MG/ML IJ SOLN
0.5000 mg | Freq: Once | INTRAMUSCULAR | Status: AC
  Administered 2022-04-22: 0.5 mg via INTRAVENOUS
  Filled 2022-04-22: qty 1

## 2022-04-22 MED ORDER — HYDROMORPHONE HCL 2 MG PO TABS: 2.0000 mg | ORAL_TABLET | ORAL | 0 refills | Status: DC | PRN

## 2022-04-22 MED ORDER — SODIUM CHLORIDE 0.9 % IV SOLN: INTRAVENOUS | Status: AC

## 2022-04-22 NOTE — Patient Instructions (Signed)

## 2022-04-23 ENCOUNTER — Ambulatory Visit: Payer: Medicare Other

## 2022-04-23 ENCOUNTER — Other Ambulatory Visit: Payer: Self-pay

## 2022-04-23 ENCOUNTER — Encounter (HOSPITAL_COMMUNITY)
Admission: RE | Admit: 2022-04-23 | Discharge: 2022-04-23 | Disposition: A | Discharge status: Home / Self Care | Payer: Medicare Other | Source: Ambulatory Visit | Attending: Thoracic Surgery (Cardiothoracic Vascular Surgery) | Admitting: Thoracic Surgery (Cardiothoracic Vascular Surgery)

## 2022-04-23 ENCOUNTER — Inpatient Hospital Stay: Payer: Medicare Other

## 2022-04-23 ENCOUNTER — Ambulatory Visit
Admission: RE | Admit: 2022-04-23 | Discharge: 2022-04-23 | Disposition: A | Discharge status: Home / Self Care | Payer: Medicare Other | Source: Ambulatory Visit | Attending: Radiation Oncology | Admitting: Radiation Oncology

## 2022-04-23 DIAGNOSIS — C3491 Malignant neoplasm of unspecified part of right bronchus or lung: Secondary | ICD-10-CM

## 2022-04-23 DIAGNOSIS — R918 Other nonspecific abnormal finding of lung field: Secondary | ICD-10-CM | POA: Diagnosis not present

## 2022-04-23 DIAGNOSIS — R300 Dysuria: Secondary | ICD-10-CM | POA: Diagnosis not present

## 2022-04-23 MED ORDER — HYDROMORPHONE HCL 1 MG/ML IJ SOLN
0.5000 mg | Freq: Once | INTRAMUSCULAR | Status: AC
  Administered 2022-04-23: 0.5 mg via INTRAVENOUS
  Filled 2022-04-23: qty 1

## 2022-04-23 MED ORDER — SODIUM CHLORIDE 0.9 % IV SOLN: INTRAVENOUS | Status: AC

## 2022-04-23 NOTE — Patient Instructions (Signed)

## 2022-04-23 NOTE — Progress Notes (Signed)
Daily Session Note  Patient Details  Name: HEBERTO STURDEVANT MRN: [id number] Date of Birth: December 21, 1945 Referring Provider:   Flowsheet Row PULMONARY REHAB OTHER RESP ORIENTATION from 12/27/2021 in [facility] CARDIAC REHABILITATION  Referring Provider Dr. Glenna Durand       Encounter Date: 04/23/2022  Check In:  Session Check In - 04/23/22 1340       Check-In   Supervising physician immediately available to respond to emergencies --    Physician(s) --    Location --    Staff Present --    Virtual Visit --    Medication changes reported     --    Fall or balance concerns reported    --    Tobacco Cessation --    Warm-up and Cool-down --    Resistance Training Performed --      Pain Assessment   Currently in Pain? --    Pain Score --    Multiple Pain Sites --             Capillary Blood Glucose: No results found for this or any previous visit (from the past 24 hour(s)).    Social History   Tobacco Use  Smoking Status Former   Packs/day: .25   Types: Cigarettes   Quit date: 05/2021   Years since quitting: 0.9  Smokeless Tobacco Never    Goals Met:  Proper associated with RPD/PD & O2 Sat Independence with exercise equipment Using PLB without cueing & demonstrates good technique Exercise tolerated well Queuing for purse lip breathing No report of concerns or symptoms today Strength training completed today  Goals Unmet:  Not Applicable  Comments: Checkout at 1430.   Dr. Erick Blinks is Medical Director for [facility] Pulmonary Rehab.

## 2022-04-23 NOTE — Progress Notes (Signed)
Daily Session Note  Patient Details  Name: Patrick Harris MRN: [id number] Date of Birth: [birth year] Referring Provider:   Flowsheet Row PULMONARY REHAB OTHER RESP ORIENTATION from 12/27/2021 in [facility] CARDIAC REHABILITATION  Referring Provider Dr. Glenna Durand       Encounter Date: 04/23/2022  Check In:  Session Check In - 04/23/22 1340       Check-In   Supervising physician immediately available to respond to emergencies [facility] MD immediately available    Physician(s) Dr Diona Browner    Location [facility]    Staff Present Ross Ludwig, BS, Exercise Physiologist;Dava Rensch Daphine Deutscher, RN, BSN;Phyllis Billingsley, RN    Virtual Visit No    Medication changes reported     No    Fall or balance concerns reported    No    Tobacco Cessation No Change    Warm-up and Cool-down Performed as group-led instruction    Resistance Training Performed Yes      Pain Assessment   Currently in Pain? No/denies    Pain Score 0-No pain    Multiple Pain Sites No             Capillary Blood Glucose: No results found for this or any previous visit (from the past 24 hour(s)).    Social History   Tobacco Use  Smoking Status Former   Packs/day: .25   Types: Cigarettes   Quit date: 05/2021   Years since quitting: 0.9  Smokeless Tobacco Never    Goals Met:  Proper associated with RPD/PD & O2 Sat Independence with exercise equipment Using PLB without cueing & demonstrates good technique Exercise tolerated well Queuing for purse lip breathing No report of concerns or symptoms today Strength training completed today  Goals Unmet:  Not Applicable  Comments: Checkout at 1430.   Dr. Erick Blinks is Medical Director for [facility] Pulmonary Rehab.

## 2022-04-24 ENCOUNTER — Ambulatory Visit: Payer: Medicare Other

## 2022-04-24 ENCOUNTER — Telehealth: Payer: Self-pay | Admitting: Internal Medicine

## 2022-04-24 ENCOUNTER — Inpatient Hospital Stay: Payer: Medicare Other

## 2022-04-24 DIAGNOSIS — R918 Other nonspecific abnormal finding of lung field: Secondary | ICD-10-CM | POA: Diagnosis not present

## 2022-04-24 DIAGNOSIS — C3491 Malignant neoplasm of unspecified part of right bronchus or lung: Secondary | ICD-10-CM

## 2022-04-24 DIAGNOSIS — R300 Dysuria: Secondary | ICD-10-CM | POA: Diagnosis not present

## 2022-04-24 LAB — CMP (CANCER CENTER ONLY)
ALT: 18 U/L (ref 0–44)
AST: 19 U/L (ref 15–41)
Albumin: 3.5 g/dL (ref 3.5–5.0)
Alkaline Phosphatase: 50 U/L (ref 38–126)
Anion gap: 5 (ref 5–15)
BUN: 15 mg/dL (ref 8–23)
CO2: 28 mmol/L (ref 22–32)
Calcium: 9.1 mg/dL (ref 8.9–10.3)
Chloride: 104 mmol/L (ref 98–111)
Creatinine: 0.79 mg/dL (ref 0.61–1.24)
GFR, Estimated: >60 mL/min (ref >60)
Glucose, Bld: 92 mg/dL (ref 70–99)
Potassium: 4 mmol/L (ref 3.5–5.1)
Sodium: 137 mmol/L (ref 135–145)
Total Bilirubin: 0.5 mg/dL (ref 0.3–1.2)
Total Protein: 6.2 g/dL — ABNORMAL LOW (ref 6.5–8.1)

## 2022-04-24 LAB — CBC WITH DIFFERENTIAL (CANCER CENTER ONLY)
Abs Immature Granulocytes: 0.01 10*3/uL (ref 0.00–0.07)
Basophils Absolute: 0 10*3/uL (ref 0.0–0.1)
Basophils Relative: 1 %
Eosinophils Absolute: 0 10*3/uL (ref 0.0–0.5)
Eosinophils Relative: 0 %
HCT: 28.5 % — ABNORMAL LOW (ref 39.0–52.0)
Hemoglobin: 9.9 g/dL — ABNORMAL LOW (ref 13.0–17.0)
Immature Granulocytes: 1 %
Lymphocytes Relative: 11 %
Lymphs Abs: 0.2 10*3/uL — ABNORMAL LOW (ref 0.7–4.0)
MCH: 31.6 pg (ref 26.0–34.0)
MCHC: 34.7 g/dL (ref 30.0–36.0)
MCV: 91.1 fL (ref 80.0–100.0)
Monocytes Absolute: 0.5 10*3/uL (ref 0.1–1.0)
Monocytes Relative: 25 %
Neutro Abs: 1.2 10*3/uL — ABNORMAL LOW (ref 1.7–7.7)
Neutrophils Relative %: 62 %
Platelet Count: 53 10*3/uL — ABNORMAL LOW (ref 150–400)
RBC: 3.13 MIL/uL — ABNORMAL LOW (ref 4.22–5.81)
RDW: 14.8 % (ref 11.5–15.5)
WBC Count: 1.9 10*3/uL — ABNORMAL LOW (ref 4.0–10.5)
nRBC: 0 % (ref 0.0–0.2)

## 2022-04-24 NOTE — Telephone Encounter (Signed)
Contacted patient to scheduled appointments. Patient is aware of appointments that are scheduled.   

## 2022-04-25 ENCOUNTER — Ambulatory Visit: Payer: Medicare Other

## 2022-04-25 ENCOUNTER — Inpatient Hospital Stay: Payer: Medicare Other

## 2022-04-25 ENCOUNTER — Other Ambulatory Visit: Payer: Self-pay | Admitting: Radiology

## 2022-04-25 ENCOUNTER — Telehealth: Payer: Self-pay | Admitting: Cardiology

## 2022-04-25 ENCOUNTER — Encounter (HOSPITAL_COMMUNITY)
Admission: RE | Admit: 2022-04-25 | Discharge: 2022-04-25 | Disposition: A | Discharge status: Home / Self Care | Payer: Medicare Other | Source: Ambulatory Visit | Attending: Thoracic Surgery (Cardiothoracic Vascular Surgery) | Admitting: Thoracic Surgery (Cardiothoracic Vascular Surgery)

## 2022-04-25 ENCOUNTER — Encounter: Payer: Self-pay | Admitting: Internal Medicine

## 2022-04-25 ENCOUNTER — Other Ambulatory Visit (HOSPITAL_COMMUNITY): Payer: Self-pay

## 2022-04-25 DIAGNOSIS — C3411 Malignant neoplasm of upper lobe, right bronchus or lung: Secondary | ICD-10-CM

## 2022-04-25 DIAGNOSIS — C3491 Malignant neoplasm of unspecified part of right bronchus or lung: Secondary | ICD-10-CM

## 2022-04-25 DIAGNOSIS — R300 Dysuria: Secondary | ICD-10-CM | POA: Diagnosis not present

## 2022-04-25 DIAGNOSIS — C3412 Malignant neoplasm of upper lobe, left bronchus or lung: Secondary | ICD-10-CM

## 2022-04-25 DIAGNOSIS — R918 Other nonspecific abnormal finding of lung field: Secondary | ICD-10-CM | POA: Diagnosis not present

## 2022-04-25 MED ORDER — HYDROMORPHONE HCL 4 MG PO TABS
4.0000 mg | ORAL_TABLET | ORAL | 0 refills | Status: DC | PRN
Start: 2022-04-25 — End: 2022-04-25

## 2022-04-25 MED ORDER — SODIUM CHLORIDE 0.9 % IV SOLN: INTRAVENOUS | Status: DC

## 2022-04-25 MED ORDER — HYDROMORPHONE HCL 4 MG PO TABS
4.0000 mg | ORAL_TABLET | ORAL | 0 refills | Status: DC | PRN
Start: 2022-04-25 — End: 2022-04-29
  Filled 2022-04-25: qty 42, 7d supply, fill #0

## 2022-04-25 MED ORDER — SODIUM CHLORIDE 0.9 % IV SOLN: INTRAVENOUS | Status: AC

## 2022-04-25 MED ORDER — HYDROMORPHONE HCL 1 MG/ML IJ SOLN
1.0000 mg | Freq: Once | INTRAMUSCULAR | Status: AC
  Administered 2022-04-25: 1 mg via INTRAVENOUS
  Filled 2022-04-25: qty 1

## 2022-04-25 NOTE — Progress Notes (Signed)
Pt c/o chronic chest wall pain d/t radiation.  Rates 5/10 on scale and describes as "tingling with occasional tightening"  Pt given 1mg  Dilaudid IV per sign and held orders.

## 2022-04-25 NOTE — Progress Notes (Signed)
Patient presented today for pain from radiation to his esophagus. He was originally prescribed 2 mg Dilaudid q4h PRN. This did not alleviate his pain, so he was told to increase to 4 mg Dilaudid q4h PRN. Patient is asking for a refill for this medication. I sent in a 7 day prescription for 4 mg q4h PRN Dilaudid to get him through the rest of his radiation treatment.   Patient also expressed concern for his lower leg swelling. On physical exam patient had +1 bilateral edema. Legs were not erythematous, warm, or painful to touch. Patient sees a cardiologist regularly. I encouraged him to follow up with his cardiologist for this issue as it is not related to radiation and is not an acute problem.     Joyice Faster, PA-C

## 2022-04-25 NOTE — Telephone Encounter (Signed)
Pt came in office stating he is doing cancer treatment and has been given fluids for about a week. He states he has swelling in his legs and face and is concerned. He is here at [facility] right now about to do something for 2 hours w/pulmonary. He wants to be seen after can call him here [phone]

## 2022-04-25 NOTE — Telephone Encounter (Signed)
Patient walked into cardiology office before pulmonary rehab appointment today and wanted to be seen. He has been getting daily IV hydration in oncology for the past week and today he noted some leg swelling. His weight is unchanged, no SOB, he has one more day of hydration tomorrow. He takes no diuretics.  I offered him an appointment tomorrow at the [facility] office at 4 pm but he declined because he wanted to come at 4:30 pm as he had other things he needed to do. We talked about wearing compression stockings and elevating legs when sitting. He states he would try that first.

## 2022-04-25 NOTE — Progress Notes (Signed)
Daily Session Note  Patient Details  Name: Patrick Harris MRN: [id number] Date of Birth: [birth year] Referring Provider:   Flowsheet Row PULMONARY REHAB OTHER RESP ORIENTATION from 12/27/2021 in [facility] CARDIAC REHABILITATION  Referring Provider Dr. Glenna Durand       Encounter Date: 04/25/2022  Check In:  Session Check In - 04/25/22 1330       Check-In   Supervising physician immediately available to respond to emergencies [facility] MD immediately available    Physician(s) Dr Diona Browner    Location [facility]    Staff Present Ross Ludwig, BS, Exercise Physiologist;Promiss Labarbera BSN, RN;Daphyne Daphine Deutscher, RN, BSN    Virtual Visit No    Medication changes reported     No    Fall or balance concerns reported    No    Tobacco Cessation No Change    Warm-up and Cool-down Performed as group-led instruction    Resistance Training Performed Yes    VAD Patient? No    PAD/SET Patient? No      Pain Assessment   Currently in Pain? No/denies    Pain Score 0-No pain    Multiple Pain Sites No             Capillary Blood Glucose: No results found for this or any previous visit (from the past 24 hour(s)).    Social History   Tobacco Use  Smoking Status Former   Packs/day: .25   Types: Cigarettes   Quit date: 05/2021   Years since quitting: 0.9  Smokeless Tobacco Never    Goals Met:  Proper associated with RPD/PD & O2 Sat Independence with exercise equipment Using PLB without cueing & demonstrates good technique Exercise tolerated well Queuing for purse lip breathing No report of concerns or symptoms today Strength training completed today  Goals Unmet:  Not Applicable  Comments: check out at 14:30  Pt completed his walk test during the first exercise station.   Dr. Erick Blinks is Medical Director for [facility] Pulmonary Rehab.

## 2022-04-26 ENCOUNTER — Ambulatory Visit: Payer: Medicare Other | Admitting: Nurse Practitioner

## 2022-04-26 ENCOUNTER — Inpatient Hospital Stay: Payer: Medicare Other

## 2022-04-26 ENCOUNTER — Ambulatory Visit: Payer: Medicare Other

## 2022-04-26 VITALS — BP 136/70 | HR 63 | Temp 98.3°F | Resp 16 | Wt 183.0 lb

## 2022-04-26 DIAGNOSIS — R918 Other nonspecific abnormal finding of lung field: Secondary | ICD-10-CM

## 2022-04-26 DIAGNOSIS — C3491 Malignant neoplasm of unspecified part of right bronchus or lung: Secondary | ICD-10-CM

## 2022-04-26 DIAGNOSIS — R300 Dysuria: Secondary | ICD-10-CM | POA: Diagnosis not present

## 2022-04-26 DIAGNOSIS — Z95828 Presence of other vascular implants and grafts: Secondary | ICD-10-CM

## 2022-04-26 MED ORDER — SODIUM CHLORIDE 0.9% FLUSH
10.0000 mL | Freq: Once | INTRAVENOUS | Status: AC
  Administered 2022-04-26: 10 mL via INTRAVENOUS

## 2022-04-26 MED ORDER — SODIUM CHLORIDE 0.9 % IV SOLN: INTRAVENOUS | Status: DC

## 2022-04-26 MED ORDER — HYDROMORPHONE HCL 1 MG/ML IJ SOLN
1.0000 mg | Freq: Once | INTRAMUSCULAR | Status: AC
  Administered 2022-04-26: 1 mg via INTRAVENOUS
  Filled 2022-04-26: qty 1

## 2022-04-26 MED ORDER — HEPARIN SOD (PORK) LOCK FLUSH 100 UNIT/ML IV SOLN
500.0000 [IU] | Freq: Once | INTRAVENOUS | Status: AC
  Administered 2022-04-26: 500 [IU] via INTRAVENOUS

## 2022-04-26 NOTE — Patient Instructions (Signed)

## 2022-04-29 ENCOUNTER — Ambulatory Visit: Payer: Medicare Other

## 2022-04-29 ENCOUNTER — Ambulatory Visit
Admission: RE | Admit: 2022-04-29 | Discharge: 2022-04-29 | Disposition: A | Discharge status: Home / Self Care | Payer: Medicare Other | Source: Ambulatory Visit | Attending: Radiation Oncology | Admitting: Radiation Oncology

## 2022-04-29 ENCOUNTER — Other Ambulatory Visit: Payer: Self-pay

## 2022-04-29 ENCOUNTER — Other Ambulatory Visit: Payer: Self-pay | Admitting: Radiation Oncology

## 2022-04-29 ENCOUNTER — Telehealth: Payer: Self-pay

## 2022-04-29 ENCOUNTER — Other Ambulatory Visit: Payer: Self-pay | Admitting: Internal Medicine

## 2022-04-29 DIAGNOSIS — C3411 Malignant neoplasm of upper lobe, right bronchus or lung: Secondary | ICD-10-CM | POA: Diagnosis not present

## 2022-04-29 DIAGNOSIS — Z510 Encounter for antineoplastic radiation therapy: Secondary | ICD-10-CM | POA: Diagnosis not present

## 2022-04-29 DIAGNOSIS — R300 Dysuria: Secondary | ICD-10-CM | POA: Diagnosis not present

## 2022-04-29 DIAGNOSIS — C3491 Malignant neoplasm of unspecified part of right bronchus or lung: Secondary | ICD-10-CM | POA: Diagnosis not present

## 2022-04-29 DIAGNOSIS — R918 Other nonspecific abnormal finding of lung field: Secondary | ICD-10-CM | POA: Diagnosis not present

## 2022-04-29 DIAGNOSIS — Z87891 Personal history of nicotine dependence: Secondary | ICD-10-CM | POA: Diagnosis not present

## 2022-04-29 LAB — RAD ONC ARIA SESSION SUMMARY
Course Elapsed Days: 41
Plan Fractions Treated to Date: 25
Plan Prescribed Dose Per Fraction: 2 Gy
Plan Total Fractions Prescribed: 30
Plan Total Prescribed Dose: 60 Gy
Reference Point Dosage Given to Date: 50 Gy
Reference Point Session Dosage Given: 2 Gy
Session Number: 25

## 2022-04-29 MED ORDER — OXYCODONE HCL 20 MG PO TABS: 20.0000 mg | ORAL_TABLET | Freq: Four times a day (QID) | ORAL | 0 refills | Status: DC | PRN

## 2022-04-29 NOTE — Telephone Encounter (Signed)
Received call from [facility] requesting prior auth for Oxycodone 20 mg. Called [phone] and spoke with Lowella Bandy. Reference ID PA- [id number].

## 2022-04-30 ENCOUNTER — Encounter (HOSPITAL_COMMUNITY)
Admission: RE | Admit: 2022-04-30 | Discharge: 2022-04-30 | Disposition: A | Discharge status: Home / Self Care | Payer: Medicare Other | Source: Ambulatory Visit | Attending: Thoracic Surgery (Cardiothoracic Vascular Surgery) | Admitting: Thoracic Surgery (Cardiothoracic Vascular Surgery)

## 2022-04-30 ENCOUNTER — Other Ambulatory Visit: Payer: Self-pay

## 2022-04-30 ENCOUNTER — Ambulatory Visit: Payer: Medicare Other

## 2022-04-30 ENCOUNTER — Ambulatory Visit
Admission: RE | Admit: 2022-04-30 | Discharge: 2022-04-30 | Disposition: A | Discharge status: Home / Self Care | Payer: Medicare Other | Source: Ambulatory Visit | Attending: Radiation Oncology | Admitting: Radiation Oncology

## 2022-04-30 ENCOUNTER — Inpatient Hospital Stay: Payer: Medicare Other

## 2022-04-30 DIAGNOSIS — R300 Dysuria: Secondary | ICD-10-CM | POA: Diagnosis not present

## 2022-04-30 DIAGNOSIS — C3491 Malignant neoplasm of unspecified part of right bronchus or lung: Secondary | ICD-10-CM | POA: Diagnosis not present

## 2022-04-30 DIAGNOSIS — C3411 Malignant neoplasm of upper lobe, right bronchus or lung: Secondary | ICD-10-CM

## 2022-04-30 DIAGNOSIS — Z87891 Personal history of nicotine dependence: Secondary | ICD-10-CM | POA: Diagnosis not present

## 2022-04-30 DIAGNOSIS — Z510 Encounter for antineoplastic radiation therapy: Secondary | ICD-10-CM | POA: Diagnosis not present

## 2022-04-30 DIAGNOSIS — R918 Other nonspecific abnormal finding of lung field: Secondary | ICD-10-CM | POA: Diagnosis not present

## 2022-04-30 DIAGNOSIS — C3412 Malignant neoplasm of upper lobe, left bronchus or lung: Secondary | ICD-10-CM

## 2022-04-30 LAB — RAD ONC ARIA SESSION SUMMARY
Course Elapsed Days: 42
Plan Fractions Treated to Date: 26
Plan Prescribed Dose Per Fraction: 2 Gy
Plan Total Fractions Prescribed: 30
Plan Total Prescribed Dose: 60 Gy
Reference Point Dosage Given to Date: 52 Gy
Reference Point Session Dosage Given: 2 Gy
Session Number: 26

## 2022-04-30 NOTE — Progress Notes (Signed)
Daily Session Note  Patient Details  Name: Patrick Harris MRN: [id number] Date of Birth: [birth year] Referring Provider:   Flowsheet Row PULMONARY REHAB OTHER RESP ORIENTATION from 12/27/2021 in [facility] CARDIAC REHABILITATION  Referring Provider Dr. Glenna Durand       Encounter Date: 04/30/2022  Check In:  Session Check In - 04/30/22 1330       Check-In   Supervising physician immediately available to respond to emergencies [facility] MD immediately available    Physician(s) Dr Jenene Slicker    Location [facility]    Staff Present Danyia Borunda Daphine Deutscher, RN, BSN;Other   Kary Kos, EP   Virtual Visit No    Medication changes reported     No    Fall or balance concerns reported    No    Tobacco Cessation No Change    Warm-up and Cool-down Performed as group-led instruction    Resistance Training Performed Yes      Pain Assessment   Currently in Pain? No/denies    Pain Score 0-No pain    Multiple Pain Sites No             Capillary Blood Glucose: No results found for this or any previous visit (from the past 24 hour(s)).    Social History   Tobacco Use  Smoking Status Former   Packs/day: .25   Types: Cigarettes   Quit date: 05/2021   Years since quitting: 0.9  Smokeless Tobacco Never    Goals Met:  Proper associated with RPD/PD & O2 Sat Independence with exercise equipment Using PLB without cueing & demonstrates good technique Exercise tolerated well Queuing for purse lip breathing No report of concerns or symptoms today Strength training completed today  Goals Unmet:  Not Applicable  Comments: Checkout at 1430.   Dr. Erick Blinks is Medical Director for [facility] Pulmonary Rehab.

## 2022-05-01 ENCOUNTER — Other Ambulatory Visit: Payer: Self-pay

## 2022-05-01 ENCOUNTER — Ambulatory Visit
Admission: RE | Admit: 2022-05-01 | Discharge: 2022-05-01 | Disposition: A | Discharge status: Home / Self Care | Payer: Medicare Other | Source: Ambulatory Visit | Attending: Radiation Oncology | Admitting: Radiation Oncology

## 2022-05-01 ENCOUNTER — Inpatient Hospital Stay: Payer: Medicare Other

## 2022-05-01 VITALS — BP 134/70 | HR 91 | Temp 98.1°F | Resp 17 | Wt 181.8 lb

## 2022-05-01 DIAGNOSIS — R300 Dysuria: Secondary | ICD-10-CM | POA: Diagnosis not present

## 2022-05-01 DIAGNOSIS — R918 Other nonspecific abnormal finding of lung field: Secondary | ICD-10-CM | POA: Diagnosis not present

## 2022-05-01 DIAGNOSIS — C3491 Malignant neoplasm of unspecified part of right bronchus or lung: Secondary | ICD-10-CM

## 2022-05-01 DIAGNOSIS — C3411 Malignant neoplasm of upper lobe, right bronchus or lung: Secondary | ICD-10-CM | POA: Diagnosis not present

## 2022-05-01 DIAGNOSIS — Z87891 Personal history of nicotine dependence: Secondary | ICD-10-CM | POA: Diagnosis not present

## 2022-05-01 DIAGNOSIS — Z510 Encounter for antineoplastic radiation therapy: Secondary | ICD-10-CM | POA: Diagnosis not present

## 2022-05-01 LAB — RAD ONC ARIA SESSION SUMMARY
Course Elapsed Days: 43
Plan Fractions Treated to Date: 27
Plan Prescribed Dose Per Fraction: 2 Gy
Plan Total Fractions Prescribed: 30
Plan Total Prescribed Dose: 60 Gy
Reference Point Dosage Given to Date: 54 Gy
Reference Point Session Dosage Given: 2 Gy
Session Number: 27

## 2022-05-01 LAB — CBC WITH DIFFERENTIAL (CANCER CENTER ONLY)
Abs Immature Granulocytes: 0.01 10*3/uL (ref 0.00–0.07)
Basophils Absolute: 0 10*3/uL (ref 0.0–0.1)
Basophils Relative: 0 %
Eosinophils Absolute: 0 10*3/uL (ref 0.0–0.5)
Eosinophils Relative: 0 %
HCT: 30.3 % — ABNORMAL LOW (ref 39.0–52.0)
Hemoglobin: 10 g/dL — ABNORMAL LOW (ref 13.0–17.0)
Immature Granulocytes: 0 %
Lymphocytes Relative: 11 %
Lymphs Abs: 0.4 10*3/uL — ABNORMAL LOW (ref 0.7–4.0)
MCH: 31.8 pg (ref 26.0–34.0)
MCHC: 33 g/dL (ref 30.0–36.0)
MCV: 96.5 fL (ref 80.0–100.0)
Monocytes Absolute: 1 10*3/uL (ref 0.1–1.0)
Monocytes Relative: 25 %
Neutro Abs: 2.4 10*3/uL (ref 1.7–7.7)
Neutrophils Relative %: 64 %
Platelet Count: 86 10*3/uL — ABNORMAL LOW (ref 150–400)
RBC: 3.14 MIL/uL — ABNORMAL LOW (ref 4.22–5.81)
RDW: 17.3 % — ABNORMAL HIGH (ref 11.5–15.5)
WBC Count: 3.8 10*3/uL — ABNORMAL LOW (ref 4.0–10.5)
nRBC: 0 % (ref 0.0–0.2)

## 2022-05-01 LAB — CMP (CANCER CENTER ONLY)
ALT: 15 U/L (ref 0–44)
AST: 20 U/L (ref 15–41)
Albumin: 3.6 g/dL (ref 3.5–5.0)
Alkaline Phosphatase: 53 U/L (ref 38–126)
Anion gap: 4 — ABNORMAL LOW (ref 5–15)
BUN: 17 mg/dL (ref 8–23)
CO2: 31 mmol/L (ref 22–32)
Calcium: 8.9 mg/dL (ref 8.9–10.3)
Chloride: 101 mmol/L (ref 98–111)
Creatinine: 0.92 mg/dL (ref 0.61–1.24)
GFR, Estimated: >60 mL/min (ref >60)
Glucose, Bld: 110 mg/dL — ABNORMAL HIGH (ref 70–99)
Potassium: 3.9 mmol/L (ref 3.5–5.1)
Sodium: 136 mmol/L (ref 135–145)
Total Bilirubin: 0.5 mg/dL (ref 0.3–1.2)
Total Protein: 6.5 g/dL (ref 6.5–8.1)

## 2022-05-01 MED ORDER — OXYCODONE HCL 5 MG PO TABS
20.0000 mg | ORAL_TABLET | Freq: Once | ORAL | Status: AC
  Administered 2022-05-01: 20 mg via ORAL
  Filled 2022-05-01: qty 4

## 2022-05-01 NOTE — Progress Notes (Signed)
Patient reports he is maintaining good PO fluid intake- no need for IV fluids at this time. Vitals are stable and no signs of distress or dehydration. He reports 8/10 radiation related chest pain and does not have his home  Oxycodone with him- verbal order for 20 mg Oxycodone obtained from Namon Cirri, PA-C. RN confirmed patient is not driving home. Patient discharged in stable condition and no further complaints at this time. Dr. Elijah Birk nurse made aware.

## 2022-05-02 ENCOUNTER — Other Ambulatory Visit: Payer: Self-pay

## 2022-05-02 ENCOUNTER — Ambulatory Visit
Admission: RE | Admit: 2022-05-02 | Discharge: 2022-05-02 | Disposition: A | Discharge status: Home / Self Care | Payer: Medicare Other | Source: Ambulatory Visit | Attending: Radiation Oncology | Admitting: Radiation Oncology

## 2022-05-02 DIAGNOSIS — R300 Dysuria: Secondary | ICD-10-CM | POA: Diagnosis not present

## 2022-05-02 DIAGNOSIS — R918 Other nonspecific abnormal finding of lung field: Secondary | ICD-10-CM | POA: Diagnosis not present

## 2022-05-02 DIAGNOSIS — Z510 Encounter for antineoplastic radiation therapy: Secondary | ICD-10-CM | POA: Diagnosis not present

## 2022-05-02 DIAGNOSIS — C3491 Malignant neoplasm of unspecified part of right bronchus or lung: Secondary | ICD-10-CM

## 2022-05-02 DIAGNOSIS — C3411 Malignant neoplasm of upper lobe, right bronchus or lung: Secondary | ICD-10-CM | POA: Diagnosis not present

## 2022-05-02 DIAGNOSIS — Z87891 Personal history of nicotine dependence: Secondary | ICD-10-CM | POA: Diagnosis not present

## 2022-05-02 LAB — RAD ONC ARIA SESSION SUMMARY
Course Elapsed Days: 44
Plan Fractions Treated to Date: 28
Plan Prescribed Dose Per Fraction: 2 Gy
Plan Total Fractions Prescribed: 30
Plan Total Prescribed Dose: 60 Gy
Reference Point Dosage Given to Date: 56 Gy
Reference Point Session Dosage Given: 2 Gy
Session Number: 28

## 2022-05-02 MED ORDER — SONAFINE EX EMUL
1.0000 | Freq: Two times a day (BID) | CUTANEOUS | Status: DC
  Administered 2022-05-02: 1 via TOPICAL

## 2022-05-03 ENCOUNTER — Ambulatory Visit
Admission: RE | Admit: 2022-05-03 | Discharge: 2022-05-03 | Disposition: A | Discharge status: Home / Self Care | Payer: Medicare Other | Source: Ambulatory Visit | Attending: Radiation Oncology | Admitting: Radiation Oncology

## 2022-05-03 ENCOUNTER — Other Ambulatory Visit: Payer: Self-pay

## 2022-05-03 DIAGNOSIS — Z87891 Personal history of nicotine dependence: Secondary | ICD-10-CM | POA: Diagnosis not present

## 2022-05-03 DIAGNOSIS — R300 Dysuria: Secondary | ICD-10-CM | POA: Diagnosis not present

## 2022-05-03 DIAGNOSIS — C3491 Malignant neoplasm of unspecified part of right bronchus or lung: Secondary | ICD-10-CM | POA: Diagnosis not present

## 2022-05-03 DIAGNOSIS — Z510 Encounter for antineoplastic radiation therapy: Secondary | ICD-10-CM | POA: Diagnosis not present

## 2022-05-03 DIAGNOSIS — R918 Other nonspecific abnormal finding of lung field: Secondary | ICD-10-CM | POA: Diagnosis not present

## 2022-05-03 DIAGNOSIS — C3411 Malignant neoplasm of upper lobe, right bronchus or lung: Secondary | ICD-10-CM | POA: Diagnosis not present

## 2022-05-03 LAB — RAD ONC ARIA SESSION SUMMARY
Course Elapsed Days: 45
Plan Fractions Treated to Date: 29
Plan Prescribed Dose Per Fraction: 2 Gy
Plan Total Fractions Prescribed: 30
Plan Total Prescribed Dose: 60 Gy
Reference Point Dosage Given to Date: 58 Gy
Reference Point Session Dosage Given: 2 Gy
Session Number: 29

## 2022-05-06 ENCOUNTER — Other Ambulatory Visit: Payer: Self-pay

## 2022-05-06 ENCOUNTER — Ambulatory Visit
Admission: RE | Admit: 2022-05-06 | Discharge: 2022-05-06 | Disposition: A | Discharge status: Home / Self Care | Payer: Medicare Other | Source: Ambulatory Visit | Attending: Radiation Oncology | Admitting: Radiation Oncology

## 2022-05-06 ENCOUNTER — Other Ambulatory Visit: Payer: Self-pay | Admitting: Medical Oncology

## 2022-05-06 ENCOUNTER — Other Ambulatory Visit: Payer: Self-pay | Admitting: Radiation Oncology

## 2022-05-06 DIAGNOSIS — R918 Other nonspecific abnormal finding of lung field: Secondary | ICD-10-CM | POA: Diagnosis not present

## 2022-05-06 DIAGNOSIS — R300 Dysuria: Secondary | ICD-10-CM | POA: Diagnosis not present

## 2022-05-06 DIAGNOSIS — Z510 Encounter for antineoplastic radiation therapy: Secondary | ICD-10-CM | POA: Diagnosis not present

## 2022-05-06 DIAGNOSIS — Z87891 Personal history of nicotine dependence: Secondary | ICD-10-CM | POA: Diagnosis not present

## 2022-05-06 DIAGNOSIS — C3411 Malignant neoplasm of upper lobe, right bronchus or lung: Secondary | ICD-10-CM | POA: Diagnosis not present

## 2022-05-06 DIAGNOSIS — C3491 Malignant neoplasm of unspecified part of right bronchus or lung: Secondary | ICD-10-CM | POA: Diagnosis not present

## 2022-05-06 LAB — RAD ONC ARIA SESSION SUMMARY
Course Elapsed Days: 48
Plan Fractions Treated to Date: 30
Plan Prescribed Dose Per Fraction: 2 Gy
Plan Total Fractions Prescribed: 30
Plan Total Prescribed Dose: 60 Gy
Reference Point Dosage Given to Date: 60 Gy
Reference Point Session Dosage Given: 2 Gy
Session Number: 30

## 2022-05-06 MED ORDER — OXYCODONE HCL 20 MG PO TABS: 20.0000 mg | ORAL_TABLET | Freq: Four times a day (QID) | ORAL | 0 refills | Status: DC | PRN

## 2022-05-06 MED ORDER — ONDANSETRON HCL 8 MG PO TABS: 8.0000 mg | ORAL_TABLET | Freq: Three times a day (TID) | ORAL | 0 refills | Status: DC | PRN

## 2022-05-07 ENCOUNTER — Other Ambulatory Visit: Payer: Medicare Other

## 2022-05-07 ENCOUNTER — Ambulatory Visit: Payer: Medicare Other | Admitting: Internal Medicine

## 2022-05-08 ENCOUNTER — Inpatient Hospital Stay: Payer: Medicare Other | Attending: Internal Medicine

## 2022-05-08 DIAGNOSIS — E860 Dehydration: Secondary | ICD-10-CM | POA: Insufficient documentation

## 2022-05-08 DIAGNOSIS — Z79620 Long term (current) use of immunosuppressive biologic: Secondary | ICD-10-CM | POA: Diagnosis not present

## 2022-05-08 DIAGNOSIS — C3491 Malignant neoplasm of unspecified part of right bronchus or lung: Secondary | ICD-10-CM

## 2022-05-08 DIAGNOSIS — C3411 Malignant neoplasm of upper lobe, right bronchus or lung: Secondary | ICD-10-CM | POA: Diagnosis not present

## 2022-05-08 DIAGNOSIS — Z5112 Encounter for antineoplastic immunotherapy: Secondary | ICD-10-CM | POA: Diagnosis not present

## 2022-05-08 DIAGNOSIS — R1310 Dysphagia, unspecified: Secondary | ICD-10-CM | POA: Diagnosis not present

## 2022-05-08 DIAGNOSIS — C3412 Malignant neoplasm of upper lobe, left bronchus or lung: Secondary | ICD-10-CM | POA: Insufficient documentation

## 2022-05-08 LAB — CBC WITH DIFFERENTIAL (CANCER CENTER ONLY)
Abs Immature Granulocytes: 0.04 10*3/uL (ref 0.00–0.07)
Basophils Absolute: 0 10*3/uL (ref 0.0–0.1)
Basophils Relative: 1 %
Eosinophils Absolute: 0 10*3/uL (ref 0.0–0.5)
Eosinophils Relative: 0 %
HCT: 31.2 % — ABNORMAL LOW (ref 39.0–52.0)
Hemoglobin: 10.4 g/dL — ABNORMAL LOW (ref 13.0–17.0)
Immature Granulocytes: 1 %
Lymphocytes Relative: 12 %
Lymphs Abs: 0.4 10*3/uL — ABNORMAL LOW (ref 0.7–4.0)
MCH: 32.6 pg (ref 26.0–34.0)
MCHC: 33.3 g/dL (ref 30.0–36.0)
MCV: 97.8 fL (ref 80.0–100.0)
Monocytes Absolute: 0.7 10*3/uL (ref 0.1–1.0)
Monocytes Relative: 18 %
Neutro Abs: 2.4 10*3/uL (ref 1.7–7.7)
Neutrophils Relative %: 68 %
Platelet Count: 142 10*3/uL — ABNORMAL LOW (ref 150–400)
RBC: 3.19 MIL/uL — ABNORMAL LOW (ref 4.22–5.81)
RDW: 18.9 % — ABNORMAL HIGH (ref 11.5–15.5)
WBC Count: 3.5 10*3/uL — ABNORMAL LOW (ref 4.0–10.5)
nRBC: 0 % (ref 0.0–0.2)

## 2022-05-08 LAB — CMP (CANCER CENTER ONLY)
ALT: 14 U/L (ref 0–44)
AST: 20 U/L (ref 15–41)
Albumin: 3.7 g/dL (ref 3.5–5.0)
Alkaline Phosphatase: 58 U/L (ref 38–126)
Anion gap: 3 — ABNORMAL LOW (ref 5–15)
BUN: 18 mg/dL (ref 8–23)
CO2: 29 mmol/L (ref 22–32)
Calcium: 8.7 mg/dL — ABNORMAL LOW (ref 8.9–10.3)
Chloride: 104 mmol/L (ref 98–111)
Creatinine: 0.92 mg/dL (ref 0.61–1.24)
GFR, Estimated: >60 mL/min (ref >60)
Glucose, Bld: 122 mg/dL — ABNORMAL HIGH (ref 70–99)
Potassium: 4.3 mmol/L (ref 3.5–5.1)
Sodium: 136 mmol/L (ref 135–145)
Total Bilirubin: 0.5 mg/dL (ref 0.3–1.2)
Total Protein: 6.3 g/dL — ABNORMAL LOW (ref 6.5–8.1)

## 2022-05-09 NOTE — Radiation Completion Notes (Signed)
Patient Name: Patrick Harris, Patrick Harris MRN: [id number] Date of Birth: [birth year] Referring Physician: Veronia Beets, M.D. Date of Service: 2022-05-09 Radiation Oncologist: Arnette Schaumann, M.D. [facility]                             RADIATION ONCOLOGY END OF TREATMENT NOTE     Diagnosis: C34.11 Malignant neoplasm of upper lobe, right bronchus or lung Intent: Curative     ==========DELIVERED PLANS==========  First Treatment Date: 2022-03-19 - Last Treatment Date: 2022-05-06   Plan Name: Chest Site: Mediastinum Technique: 3D Mode: Photon Dose Per Fraction: 2 Gy Prescribed Dose (Delivered / Prescribed): 60 Gy / 60 Gy Prescribed Fxs (Delivered / Prescribed): 30 / 30     ==========ON TREATMENT VISIT DATES========== 2022-03-19, 2022-03-26, 2022-04-02, 2022-04-09, 2022-04-16, 2022-04-18, 2022-04-22, 2022-04-23, 2022-04-29, 2022-05-06     ==========UPCOMING VISITS==========       ==========APPENDIX - ON TREATMENT VISIT NOTES==========   See weekly On Treatment Notes is Epic for details.

## 2022-05-10 NOTE — Progress Notes (Signed)
Discharge Progress Report  Patient Details  Name: Patrick Harris MRN: [id number] Date of Birth: 1945-08-29 Referring Provider:   Flowsheet Row PULMONARY REHAB OTHER RESP ORIENTATION from 12/27/2021 in [facility] CARDIAC REHABILITATION  Referring Provider Dr. Glenna Durand        Number of Visits: 28  Reason for Discharge:  Patient reached a stable level of exercise. Patient independent in their exercise. Patient has met program and personal goals.  Smoking History:  Social History   Tobacco Use  Smoking Status Former   Packs/day: .25   Types: Cigarettes   Quit date: 05/2021   Years since quitting: 1.0  Smokeless Tobacco Never    Diagnosis:  Malignant neoplasm of right upper lobe of lung (HCC)  Malignant neoplasm of upper lobe of left lung (HCC)  ADL [facility]:  Pulmonary Assessment Scores     Row Name 12/27/21 1312 05/02/22 1145       ADL [facility]   ADL Phase Entry Exit    SOB Score total 69 48    Rest 2 0    Walk 3 3    Stairs 4 3    Bath 3 2    Dress 3 2    Shop 3 2      CAT Score   CAT Score 23 14      mMRC Score   mMRC Score 3 3             Initial Exercise Prescription:  Initial Exercise Prescription - 12/27/21 1300       Date of Initial Exercise RX and Referring Provider   Date 12/27/21    Referring Provider Dr. Glenna Durand    Expected Discharge Date 04/30/22      Treadmill   MPH 1.5    Grade 0    Minutes 17      Recumbant Elliptical   Level 1    RPM 45    Minutes 22      Prescription Details   Frequency (times per week) 2    Duration Progress to 30 minutes of continuous aerobic without signs/symptoms of physical distress      Intensity   THRR 40-80% of Max Heartrate 58-115    Ratings of Perceived Exertion 11-13    Perceived Dyspnea 0-4      Resistance Training   Training Prescription Yes    Weight 4    Reps 10-15             Discharge Exercise Prescription (Final Exercise Prescription Changes):  Exercise Prescription Changes -  04/11/22 1200       Response to Exercise   Blood Pressure (Admit) 108/64    Blood Pressure (Exercise) 134/60    Blood Pressure (Exit) 108/56    Heart Rate (Admit) 76 bpm    Heart Rate (Exercise) 88 bpm    Heart Rate (Exit) 86 bpm    Oxygen Saturation (Admit) 94 %    Oxygen Saturation (Exercise) 95 %    Oxygen Saturation (Exit) 96 %    Rating of Perceived Exertion (Exercise) 12    Perceived Dyspnea (Exercise) 12    Duration Continue with 30 min of aerobic exercise without signs/symptoms of physical distress.    Intensity THRR unchanged      Progression   Progression Continue to progress workloads to maintain intensity without signs/symptoms of physical distress.      Resistance Training   Training Prescription Yes    Weight 5    Reps 10-15  Time 10 Minutes      Treadmill   MPH 2    Grade 0    Minutes 17    METs 2.53      Recumbant Elliptical   Level 4    RPM 33    Minutes 22    METs 1.6             Functional Capacity:  6 Minute Walk     Row Name 12/27/21 1331 04/25/22 1356       6 Minute Walk   Phase Initial Discharge    Distance 1200 feet 1350 feet    Distance % Change -- 12 %    Walk Time 6 minutes 6 minutes    # of Rest Breaks 0 0    MPH 2.27 2.55    METS 2.88 3.14    RPE 12 12    Perceived Dyspnea  12 13    VO2 Peak 10.09 11.01    Symptoms No Yes (comment)    Comments -- hip pain (6/10)    Resting HR 80 bpm 87 bpm    Resting BP 110/60 112/56    Resting Oxygen Saturation  96 % 95 %    Exercise Oxygen Saturation  during 6 min walk 93 % 94 %    Max Ex. HR 121 bpm 110 bpm    Max Ex. BP 130/60 150/60    2 Minute Post BP 120/60 130/60      Interval HR   1 Minute HR 114 103    2 Minute HR 105 105    3 Minute HR 110 103    4 Minute HR 105 110    5 Minute HR 104 110    6 Minute HR 121 110    2 Minute Post HR 80 89    Interval Heart Rate? Yes Yes      Interval Oxygen   Interval Oxygen? Yes Yes    Baseline Oxygen Saturation % 96 % 95 %     1 Minute Oxygen Saturation % 96 % 94 %    1 Minute Liters of Oxygen 0 L 0 L    2 Minute Oxygen Saturation % 94 % 94 %    2 Minute Liters of Oxygen 0 L 0 L    3 Minute Oxygen Saturation % 95 % 95 %    3 Minute Liters of Oxygen 0 L 0 L    4 Minute Oxygen Saturation % 93 % 95 %    4 Minute Liters of Oxygen 0 L 0 L    5 Minute Oxygen Saturation % 95 % 96 %    5 Minute Liters of Oxygen 0 L 0 L    6 Minute Oxygen Saturation % 94 % 96 %    6 Minute Liters of Oxygen 0 L 0 L    2 Minute Post Oxygen Saturation % 96 % 95 %    2 Minute Post Liters of Oxygen 0 L 0 L             Psychological, QOL, Others - Outcomes: PHQ 2/9:    05/02/2022   11:44 AM 12/27/2021    1:11 PM 09/24/2021    8:21 AM 09/05/2021    1:09 PM 08/29/2021    8:28 AM  Depression screen PHQ 2/9  Decreased Interest 0 0 0 0 0  Down, Depressed, Hopeless 0 0 0 0 0  PHQ - 2 Score 0 0 0 0 0  Altered sleeping  1 0 0 0 0  Tired, decreased energy 1 0 0 0 0  Change in appetite 0 0 0 0 0  Feeling bad or failure about yourself  0 0 0 0 0  Trouble concentrating 0 0 0 0 0  Moving slowly or fidgety/restless 0 0 0 0 0  Suicidal thoughts 0 0 0 0 0  PHQ-9 Score 2 0 0 0 0  Difficult doing work/chores   Not difficult at all  Not difficult at all    Quality of Life:  Quality of Life - 05/09/22 1411       Quality of Life   Select Quality of Life      Quality of Life Scores   Health/Function Pre 27.48 %    Health/Function Post 28.5 %    Health/Function % Change 3.71 %    Socioeconomic Pre 24.81 %    Socioeconomic Post 30 %    Socioeconomic % Change  20.92 %    Psych/Spiritual Pre 30 %    Psych/Spiritual Post 30 %    Psych/Spiritual % Change 0 %    Family Pre 30 %    Family Post 30 %    Family % Change 0 %    GLOBAL Pre 30 %    GLOBAL Post 29.27 %    GLOBAL % Change -2.43 %             Personal Goals: Goals established at orientation with interventions provided to work toward goal.  Personal Goals and Risk  Factors at Admission - 12/27/21 1315       Core Components/Risk Factors/Patient Goals on Admission    Weight Management Weight Maintenance    Improve shortness of breath with ADL's Yes    Intervention Provide education, individualized exercise plan and daily activity instruction to help decrease symptoms of SOB with activities of daily living.    Expected Outcomes Short Term: Improve cardiorespiratory fitness to achieve a reduction of symptoms when performing ADLs;Long Term: Be able to perform more ADLs without symptoms or delay the onset of symptoms    Increase knowledge of respiratory medications and ability to use respiratory devices properly  Yes    Intervention Provide education and demonstration as needed of appropriate use of medications, inhalers, and oxygen therapy.    Expected Outcomes Short Term: Achieves understanding of medications use. Understands that oxygen is a medication prescribed by physician. Demonstrates appropriate use of inhaler and oxygen therapy.;Long Term: Maintain appropriate use of medications, inhalers, and oxygen therapy.    Personal Goal Other Yes    Personal Goal Patient wants to be able to return to his normal strength.    Intervention Patient will attend PR 2 days/week with education and exercise.    Expected Outcomes Patient will complete the program meeting both personal and program goals.              Personal Goals Discharge:  Goals and Risk Factor Review     Row Name 01/04/22 0853 01/28/22 1504 02/25/22 1109 03/26/22 1456 05/03/22 0734     Core Components/Risk Factors/Patient Goals Review   Personal Goals Review Other Other Other -- Other;Improve shortness of breath with ADL's;Increase knowledge of respiratory medications and ability to use respiratory devices properly.   Review Patient has started PR over, due to surgery.  He attended back in October and restarting over.  He has attended 2 sessions and is doing well in the program.  His personal  goals for the program are to  get stronger and get back to his normal strength level.  He is tolerating exercise well on the Tm and recumbent elliptical and his O2 sats averaging around 94%-95% while exercising on room air.  We will continue to encourage his progress as he continues to works towards meeting these personal goals. Patient has attended 8 sessions and is doing well in the program.  His personal goals for the program are to get stronger and get back to his normal strength level.  He is tolerating exercise well on the Tm and recumbent elliptical and his O2 sats averaging around 94%-98% while exercising and no need for supplemenetal oxygen at this time.  We will help with develping effiecient breathing techniques such as purse lipped breathing and practice self pacing with increased activity.   We will continue to encourage his progress as he continues to works towards meeting these personal goals. Patient has attended 15 sessions and is doing well in the program.  His personal goals for the program are to get stronger and get back to his normal strength level.  He is tolerating exercise well on the Tm and recumbent elliptical and his O2 sats averaging around 92%-96% while exercising and no need for supplemenetal oxygen at this time.  We will help with develping effiecient breathing techniques such as purse lipped breathing and practice self pacing with increased activity.   We will continue to encourage his progress as he continues to works towards meeting these personal goals. Patient has attended 20 sessions and is doing well in the program.  His has been missing some classes due to having starting on cemo  therapy for his lungs again.  His personal goals for the program are to get stronger and get back to his normal strength level.  He is tolerating exercise well on the Tm and recumbent elliptical and his O2 sats averaging around 93%-95% while exercising and no need for supplemenetal oxygen at this  time.  We will help with develping effiecient breathing techniques such as purse lipped breathing and practice self pacing with increased activity.   We will continue to encourage his progress as he continues to works towards meeting these personal goals. Patient graduated from PR with 29 sessions. He did very well in the program. His walk test improved by 13%. His pulmonary assessment scores also improved. His weight at discharge was 84.3 KG gaining 2.8 KG. Patient says he feels stronger and is breating better and eating better. He says he did meet his personal goals in the program. He plans to continue exercising by walking. He did meet program goals.   Expected Outcomes Patient will complete the program meeting both personal and program goals. Patient will complete the program meeting both personal and program goals. Patient will complete the program meeting both personal and program goals. Patient will complete the program meeting both personal and program goals. Patient will continue exercising and continues meeting his personal goals.            Exercise Goals and Review:  Exercise Goals     Row Name 12/27/21 1334 01/08/22 1601 02/05/22 1527 03/05/22 1522 04/02/22 1541     Exercise Goals   Increase Physical Activity Yes Yes Yes Yes Yes   Intervention Provide advice, education, support and counseling about physical activity/exercise needs.;Develop an individualized exercise prescription for aerobic and resistive training based on initial evaluation findings, risk stratification, comorbidities and participant's personal goals. Provide advice, education, support and counseling about physical activity/exercise needs.;Develop an individualized  exercise prescription for aerobic and resistive training based on initial evaluation findings, risk stratification, comorbidities and participant's personal goals. Provide advice, education, support and counseling about physical activity/exercise  needs.;Develop an individualized exercise prescription for aerobic and resistive training based on initial evaluation findings, risk stratification, comorbidities and participant's personal goals. Provide advice, education, support and counseling about physical activity/exercise needs.;Develop an individualized exercise prescription for aerobic and resistive training based on initial evaluation findings, risk stratification, comorbidities and participant's personal goals. Provide advice, education, support and counseling about physical activity/exercise needs.;Develop an individualized exercise prescription for aerobic and resistive training based on initial evaluation findings, risk stratification, comorbidities and participant's personal goals.   Expected Outcomes Short Term: Attend rehab on a regular basis to increase amount of physical activity.;Long Term: Add in home exercise to make exercise part of routine and to increase amount of physical activity.;Long Term: Exercising regularly at least 3-5 days a week. Short Term: Attend rehab on a regular basis to increase amount of physical activity.;Long Term: Add in home exercise to make exercise part of routine and to increase amount of physical activity.;Long Term: Exercising regularly at least 3-5 days a week. Short Term: Attend rehab on a regular basis to increase amount of physical activity.;Long Term: Add in home exercise to make exercise part of routine and to increase amount of physical activity.;Long Term: Exercising regularly at least 3-5 days a week. Short Term: Attend rehab on a regular basis to increase amount of physical activity.;Long Term: Add in home exercise to make exercise part of routine and to increase amount of physical activity.;Long Term: Exercising regularly at least 3-5 days a week. Short Term: Attend rehab on a regular basis to increase amount of physical activity.;Long Term: Add in home exercise to make exercise part of routine and to  increase amount of physical activity.;Long Term: Exercising regularly at least 3-5 days a week.   Increase Strength and Stamina Yes Yes Yes Yes Yes   Intervention Provide advice, education, support and counseling about physical activity/exercise needs.;Develop an individualized exercise prescription for aerobic and resistive training based on initial evaluation findings, risk stratification, comorbidities and participant's personal goals. Provide advice, education, support and counseling about physical activity/exercise needs.;Develop an individualized exercise prescription for aerobic and resistive training based on initial evaluation findings, risk stratification, comorbidities and participant's personal goals. Provide advice, education, support and counseling about physical activity/exercise needs.;Develop an individualized exercise prescription for aerobic and resistive training based on initial evaluation findings, risk stratification, comorbidities and participant's personal goals. Provide advice, education, support and counseling about physical activity/exercise needs.;Develop an individualized exercise prescription for aerobic and resistive training based on initial evaluation findings, risk stratification, comorbidities and participant's personal goals. Provide advice, education, support and counseling about physical activity/exercise needs.;Develop an individualized exercise prescription for aerobic and resistive training based on initial evaluation findings, risk stratification, comorbidities and participant's personal goals.   Expected Outcomes Short Term: Increase workloads from initial exercise prescription for resistance, speed, and METs.;Short Term: Perform resistance training exercises routinely during rehab and add in resistance training at home;Long Term: Improve cardiorespiratory fitness, muscular endurance and strength as measured by increased METs and functional capacity ( ) Short Term:  Increase workloads from initial exercise prescription for resistance, speed, and METs.;Short Term: Perform resistance training exercises routinely during rehab and add in resistance training at home;Long Term: Improve cardiorespiratory fitness, muscular endurance and strength as measured by increased METs and functional capacity ( ) Short Term: Increase workloads from initial exercise prescription for resistance, speed, and METs.;Short  Term: Perform resistance training exercises routinely during rehab and add in resistance training at home;Long Term: Improve cardiorespiratory fitness, muscular endurance and strength as measured by increased METs and functional capacity ( ) Short Term: Increase workloads from initial exercise prescription for resistance, speed, and METs.;Short Term: Perform resistance training exercises routinely during rehab and add in resistance training at home;Long Term: Improve cardiorespiratory fitness, muscular endurance and strength as measured by increased METs and functional capacity ( ) Short Term: Increase workloads from initial exercise prescription for resistance, speed, and METs.;Short Term: Perform resistance training exercises routinely during rehab and add in resistance training at home;Long Term: Improve cardiorespiratory fitness, muscular endurance and strength as measured by increased METs and functional capacity ( )   Able to understand and use rate of perceived exertion (RPE) scale Yes Yes Yes Yes Yes   Intervention Provide education and explanation on how to use RPE scale Provide education and explanation on how to use RPE scale Provide education and explanation on how to use RPE scale Provide education and explanation on how to use RPE scale Provide education and explanation on how to use RPE scale   Expected Outcomes Short Term: Able to use RPE daily in rehab to express subjective intensity level;Long Term:  Able to use RPE to guide intensity level when  exercising independently Short Term: Able to use RPE daily in rehab to express subjective intensity level;Long Term:  Able to use RPE to guide intensity level when exercising independently Short Term: Able to use RPE daily in rehab to express subjective intensity level;Long Term:  Able to use RPE to guide intensity level when exercising independently Short Term: Able to use RPE daily in rehab to express subjective intensity level;Long Term:  Able to use RPE to guide intensity level when exercising independently Short Term: Able to use RPE daily in rehab to express subjective intensity level;Long Term:  Able to use RPE to guide intensity level when exercising independently   Able to understand and use Dyspnea scale Yes Yes Yes Yes Yes   Intervention Provide education and explanation on how to use Dyspnea scale Provide education and explanation on how to use Dyspnea scale Provide education and explanation on how to use Dyspnea scale Provide education and explanation on how to use Dyspnea scale Provide education and explanation on how to use Dyspnea scale   Expected Outcomes Short Term: Able to use Dyspnea scale daily in rehab to express subjective sense of shortness of breath during exertion;Long Term: Able to use Dyspnea scale to guide intensity level when exercising independently Short Term: Able to use Dyspnea scale daily in rehab to express subjective sense of shortness of breath during exertion;Long Term: Able to use Dyspnea scale to guide intensity level when exercising independently Short Term: Able to use Dyspnea scale daily in rehab to express subjective sense of shortness of breath during exertion;Long Term: Able to use Dyspnea scale to guide intensity level when exercising independently Short Term: Able to use Dyspnea scale daily in rehab to express subjective sense of shortness of breath during exertion;Long Term: Able to use Dyspnea scale to guide intensity level when exercising independently Short  Term: Able to use Dyspnea scale daily in rehab to express subjective sense of shortness of breath during exertion;Long Term: Able to use Dyspnea scale to guide intensity level when exercising independently   Knowledge and understanding of Target Heart Rate Range (THRR) Yes Yes Yes Yes Yes   Intervention Provide education and explanation of THRR including how the numbers were  predicted and where they are located for reference Provide education and explanation of THRR including how the numbers were predicted and where they are located for reference Provide education and explanation of THRR including how the numbers were predicted and where they are located for reference Provide education and explanation of THRR including how the numbers were predicted and where they are located for reference Provide education and explanation of THRR including how the numbers were predicted and where they are located for reference   Expected Outcomes Short Term: Able to state/look up THRR;Short Term: Able to use daily as guideline for intensity in rehab;Long Term: Able to use THRR to govern intensity when exercising independently Short Term: Able to state/look up THRR;Short Term: Able to use daily as guideline for intensity in rehab;Long Term: Able to use THRR to govern intensity when exercising independently Short Term: Able to state/look up THRR;Short Term: Able to use daily as guideline for intensity in rehab;Long Term: Able to use THRR to govern intensity when exercising independently Short Term: Able to state/look up THRR;Short Term: Able to use daily as guideline for intensity in rehab;Long Term: Able to use THRR to govern intensity when exercising independently Short Term: Able to state/look up THRR;Short Term: Able to use daily as guideline for intensity in rehab;Long Term: Able to use THRR to govern intensity when exercising independently   Understanding of Exercise Prescription Yes Yes Yes Yes Yes   Intervention Provide  education, explanation, and written materials on patient's individual exercise prescription Provide education, explanation, and written materials on patient's individual exercise prescription Provide education, explanation, and written materials on patient's individual exercise prescription Provide education, explanation, and written materials on patient's individual exercise prescription Provide education, explanation, and written materials on patient's individual exercise prescription   Expected Outcomes Short Term: Able to explain program exercise prescription;Long Term: Able to explain home exercise prescription to exercise independently Short Term: Able to explain program exercise prescription;Long Term: Able to explain home exercise prescription to exercise independently Short Term: Able to explain program exercise prescription;Long Term: Able to explain home exercise prescription to exercise independently Short Term: Able to explain program exercise prescription;Long Term: Able to explain home exercise prescription to exercise independently Short Term: Able to explain program exercise prescription;Long Term: Able to explain home exercise prescription to exercise independently            Exercise Goals Re-Evaluation:  Exercise Goals Re-Evaluation     Row Name 01/08/22 1601 02/05/22 1528 03/05/22 1524 04/02/22 1541       Exercise Goal Re-Evaluation   Exercise Goals Review Increase Physical Activity;Increase Strength and Stamina;Able to understand and use rate of perceived exertion (RPE) scale;Able to understand and use Dyspnea scale;Knowledge and understanding of Target Heart Rate Range (THRR);Understanding of Exercise Prescription Increase Physical Activity;Increase Strength and Stamina;Able to understand and use rate of perceived exertion (RPE) scale;Able to understand and use Dyspnea scale;Knowledge and understanding of Target Heart Rate Range (THRR);Understanding of Exercise Prescription  Increase Physical Activity;Increase Strength and Stamina;Able to understand and use rate of perceived exertion (RPE) scale;Able to understand and use Dyspnea scale;Knowledge and understanding of Target Heart Rate Range (THRR);Understanding of Exercise Prescription Increase Physical Activity;Increase Strength and Stamina;Able to understand and use rate of perceived exertion (RPE) scale;Able to understand and use Dyspnea scale;Knowledge and understanding of Target Heart Rate Range (THRR);Understanding of Exercise Prescription    Comments Pt has completed 3 session of PR. He was ready to get back to class after his surgery. He is  progressing each class with his levels. He is currently exercising at 2.38 METs on the treadmill. Will continue to monitor and progress as able. Pt has completed 12 sessions of PR. He continues to progress following his lobectomy. He is motivated to progress. He is currently exercising at 2.53 METs on the TM. Will continue to monitor and progress as able. Pt has completed 17 sessions of PR. HE continues to progress in the program and is increasing his workloads. He recently had a scan and they found no spots in his lungs but found a spot in his esophagus so he will be starting chemo and radation soon. He is currently exercising at 2.53 METs on the treadmill. Will continue to monitor and progress as able. Pt has completed 22 sessions of PR. He is tolerating exercise well while going through radiation treatment. He is motivated when he is in class and pushes himself. He is currently exercising at 2.53 METs on the treadmill.    Expected Outcomes Through exercise at home and at rehab, the patient will meet their stated goals. Through exercise at home and at rehab, the patient will meet their stated goals. Through exercise at home and at rehab, the patient will meet their stated goals. Through exercise at home and at rehab, the patient will meet their stated goals.             Nutrition &  Weight - Outcomes:  Pre Biometrics - 12/27/21 1335       Pre Biometrics   Height 6' (1.829 m)    Weight 81.5 kg    Waist Circumference 40 inches    Hip Circumference 39 inches    Waist to Hip Ratio 1.03 %    BMI (Calculated) 24.36    Triceps Skinfold 10 mm    % Body Fat 24.7 %    Grip Strength 28.6 kg    Flexibility 0 in    Single Leg Stand 0 seconds              Nutrition:  Nutrition Therapy & Goals - 12/27/21 1314       Personal Nutrition Goals   Comments Patient scored 43 on his diet assessment. Handout provided and explained regarding healthier choices. We offer 2 educational sessions on heart healthy nutrition and assistance with RD referral if patient is interested.      Intervention Plan   Intervention Nutrition handout(s) given to patient.    Expected Outcomes Short Term Goal: Understand basic principles of dietary content, such as calories, fat, sodium, cholesterol and nutrients.             Nutrition Discharge:  Nutrition Assessments - 05/02/22 1143       MEDFICTS Scores   Post Score 61             Education Questionnaire Score:  Knowledge Questionnaire Score - 05/02/22 1144       Knowledge Questionnaire Score   Post Score 17/18            Patient graduated from PR with 29 sessions. He did very well in the program. His walk test improved by 13%. His pulmonary assessment scores also improved. His weight at discharge was 84.3 KG gaining 2.8 KG. Patient says he feels stronger and is breating better and eating better. He says he did meet his personal goals in the program. He plans to continue exercising by walking. He did meet program goals.  Goals reviewed with patient; copy given to patient.

## 2022-05-13 ENCOUNTER — Other Ambulatory Visit: Payer: Self-pay | Admitting: Radiation Oncology

## 2022-05-13 MED ORDER — ONDANSETRON HCL 8 MG PO TABS: 8.0000 mg | ORAL_TABLET | Freq: Three times a day (TID) | ORAL | 2 refills | Status: DC | PRN

## 2022-05-13 MED ORDER — OXYCODONE HCL 20 MG PO TABS: 20.0000 mg | ORAL_TABLET | Freq: Four times a day (QID) | ORAL | 0 refills | Status: DC | PRN

## 2022-05-13 MED ORDER — LIDOCAINE VISCOUS HCL 2 % MT SOLN: 15.0000 mL | OROMUCOSAL | 2 refills | Status: DC | PRN

## 2022-05-13 NOTE — Telephone Encounter (Signed)
Patient called in to request refill on Zofran, Lidocaine and Oxycodone. Per patient he was unable to receive full prescription for Oxycodone due to pharmacy running out of the medication. Requesting medication be sent to [facility].

## 2022-05-16 ENCOUNTER — Emergency Department (HOSPITAL_COMMUNITY)
Admission: EM | Admit: 2022-05-16 | Discharge: 2022-05-16 | Disposition: A | Discharge status: Home / Self Care | Payer: Medicare Other | Attending: Emergency Medicine | Admitting: Emergency Medicine

## 2022-05-16 ENCOUNTER — Other Ambulatory Visit: Payer: Self-pay

## 2022-05-16 ENCOUNTER — Encounter (HOSPITAL_COMMUNITY): Payer: Self-pay

## 2022-05-16 DIAGNOSIS — Z85118 Personal history of other malignant neoplasm of bronchus and lung: Secondary | ICD-10-CM | POA: Diagnosis not present

## 2022-05-16 DIAGNOSIS — Z955 Presence of coronary angioplasty implant and graft: Secondary | ICD-10-CM | POA: Diagnosis not present

## 2022-05-16 DIAGNOSIS — E871 Hypo-osmolality and hyponatremia: Secondary | ICD-10-CM | POA: Diagnosis not present

## 2022-05-16 DIAGNOSIS — Z91040 Latex allergy status: Secondary | ICD-10-CM | POA: Diagnosis not present

## 2022-05-16 DIAGNOSIS — E860 Dehydration: Secondary | ICD-10-CM | POA: Diagnosis not present

## 2022-05-16 DIAGNOSIS — Z79899 Other long term (current) drug therapy: Secondary | ICD-10-CM | POA: Diagnosis not present

## 2022-05-16 DIAGNOSIS — R112 Nausea with vomiting, unspecified: Secondary | ICD-10-CM | POA: Diagnosis present

## 2022-05-16 DIAGNOSIS — K5900 Constipation, unspecified: Secondary | ICD-10-CM | POA: Insufficient documentation

## 2022-05-16 DIAGNOSIS — Z7982 Long term (current) use of aspirin: Secondary | ICD-10-CM | POA: Diagnosis not present

## 2022-05-16 DIAGNOSIS — J449 Chronic obstructive pulmonary disease, unspecified: Secondary | ICD-10-CM | POA: Diagnosis not present

## 2022-05-16 DIAGNOSIS — I110 Hypertensive heart disease with heart failure: Secondary | ICD-10-CM | POA: Insufficient documentation

## 2022-05-16 DIAGNOSIS — I2510 Atherosclerotic heart disease of native coronary artery without angina pectoris: Secondary | ICD-10-CM | POA: Diagnosis not present

## 2022-05-16 DIAGNOSIS — I509 Heart failure, unspecified: Secondary | ICD-10-CM | POA: Diagnosis not present

## 2022-05-16 LAB — COMPREHENSIVE METABOLIC PANEL
ALT: 15 U/L (ref 0–44)
AST: 18 U/L (ref 15–41)
Albumin: 3.4 g/dL — ABNORMAL LOW (ref 3.5–5.0)
Alkaline Phosphatase: 50 U/L (ref 38–126)
Anion gap: 6 (ref 5–15)
BUN: 16 mg/dL (ref 8–23)
CO2: 25 mmol/L (ref 22–32)
Calcium: 8.3 mg/dL — ABNORMAL LOW (ref 8.9–10.3)
Chloride: 99 mmol/L (ref 98–111)
Creatinine, Ser: 0.88 mg/dL (ref 0.61–1.24)
GFR, Estimated: >60 mL/min (ref >60)
Glucose, Bld: 118 mg/dL — ABNORMAL HIGH (ref 70–99)
Potassium: 3.8 mmol/L (ref 3.5–5.1)
Sodium: 130 mmol/L — ABNORMAL LOW (ref 135–145)
Total Bilirubin: 0.9 mg/dL (ref 0.3–1.2)
Total Protein: 6.7 g/dL (ref 6.5–8.1)

## 2022-05-16 LAB — CBC WITH DIFFERENTIAL/PLATELET
Abs Immature Granulocytes: 0.03 10*3/uL (ref 0.00–0.07)
Basophils Absolute: 0 10*3/uL (ref 0.0–0.1)
Basophils Relative: 0 %
Eosinophils Absolute: 0 10*3/uL (ref 0.0–0.5)
Eosinophils Relative: 0 %
HCT: 32.3 % — ABNORMAL LOW (ref 39.0–52.0)
Hemoglobin: 10.6 g/dL — ABNORMAL LOW (ref 13.0–17.0)
Immature Granulocytes: 0 %
Lymphocytes Relative: 6 %
Lymphs Abs: 0.4 10*3/uL — ABNORMAL LOW (ref 0.7–4.0)
MCH: 32.3 pg (ref 26.0–34.0)
MCHC: 32.8 g/dL (ref 30.0–36.0)
MCV: 98.5 fL (ref 80.0–100.0)
Monocytes Absolute: 1.1 10*3/uL — ABNORMAL HIGH (ref 0.1–1.0)
Monocytes Relative: 14 %
Neutro Abs: 6.2 10*3/uL (ref 1.7–7.7)
Neutrophils Relative %: 80 %
Platelets: 151 10*3/uL (ref 150–400)
RBC: 3.28 MIL/uL — ABNORMAL LOW (ref 4.22–5.81)
RDW: 19 % — ABNORMAL HIGH (ref 11.5–15.5)
WBC: 7.7 10*3/uL (ref 4.0–10.5)
nRBC: 0 % (ref 0.0–0.2)

## 2022-05-16 LAB — LIPASE, BLOOD: Lipase: 23 U/L (ref 11–51)

## 2022-05-16 MED ORDER — SODIUM CHLORIDE 0.9 % IV BOLUS (SEPSIS)
1000.0000 mL | Freq: Once | INTRAVENOUS | Status: AC
  Administered 2022-05-16: 1000 mL via INTRAVENOUS

## 2022-05-16 MED ORDER — ONDANSETRON HCL 4 MG/2ML IJ SOLN
4.0000 mg | Freq: Once | INTRAMUSCULAR | Status: AC
  Administered 2022-05-16: 4 mg via INTRAVENOUS
  Filled 2022-05-16: qty 2

## 2022-05-16 MED ORDER — DEXAMETHASONE 4 MG PO TABS: 4.0000 mg | ORAL_TABLET | Freq: Every day | ORAL | 0 refills | Status: DC

## 2022-05-16 MED ORDER — FENTANYL CITRATE PF 50 MCG/ML IJ SOSY
50.0000 ug | PREFILLED_SYRINGE | Freq: Once | INTRAMUSCULAR | Status: AC
  Administered 2022-05-16: 50 ug via INTRAVENOUS
  Filled 2022-05-16: qty 1

## 2022-05-16 MED ORDER — PROCHLORPERAZINE MALEATE 10 MG PO TABS: 10.0000 mg | ORAL_TABLET | Freq: Four times a day (QID) | ORAL | 0 refills | Status: DC | PRN

## 2022-05-16 MED ORDER — DEXAMETHASONE SODIUM PHOSPHATE 4 MG/ML IJ SOLN
4.0000 mg | Freq: Once | INTRAMUSCULAR | Status: AC
  Administered 2022-05-16: 4 mg via INTRAVENOUS
  Filled 2022-05-16: qty 1

## 2022-05-16 MED ORDER — DIPHENHYDRAMINE HCL 50 MG/ML IJ SOLN
25.0000 mg | Freq: Once | INTRAMUSCULAR | Status: AC
  Administered 2022-05-16: 25 mg via INTRAVENOUS
  Filled 2022-05-16: qty 1

## 2022-05-16 MED ORDER — METOCLOPRAMIDE HCL 5 MG/ML IJ SOLN
10.0000 mg | Freq: Once | INTRAMUSCULAR | Status: AC
  Administered 2022-05-16: 10 mg via INTRAVENOUS
  Filled 2022-05-16: qty 2

## 2022-05-16 MED ORDER — ALUM & MAG HYDROXIDE-SIMETH 200-200-20 MG/5ML PO SUSP
30.0000 mL | Freq: Once | ORAL | Status: AC
  Administered 2022-05-16: 30 mL via ORAL
  Filled 2022-05-16: qty 30

## 2022-05-16 MED ORDER — LORAZEPAM 0.5 MG PO TABS: 0.5000 mg | ORAL_TABLET | Freq: Three times a day (TID) | ORAL | 0 refills | Status: DC | PRN

## 2022-05-16 MED ORDER — PROCHLORPERAZINE EDISYLATE 10 MG/2ML IJ SOLN: 10.0000 mg | Freq: Once | INTRAMUSCULAR | Status: DC

## 2022-05-16 NOTE — Discharge Instructions (Addendum)
You can call the cancer center at [phone] if you need to be seen for IV fluids this weekend You can take the Ativan 0.5 mg tablets every 8 hours as needed for anxiety or nausea You can take a dexamethasone/Decadron tablet once a day starting tomorrow on Saturday The Compazine is also been sent to your pharmacy, and this can be alternated with Zofran

## 2022-05-16 NOTE — ED Provider Notes (Signed)
[place] EMERGENCY DEPARTMENT AT [facility] Provider Note   CSN: [id number] Arrival date & time: 05/16/22  0349     History  Chief Complaint  Patient presents with   Emesis    Patrick Harris is a 77 y.o. male.  The history is provided by the patient and the spouse.  Patient with history of CAD, CHF, COPD, non-small cell lung CA resents with nausea and vomiting. Patient is recently underwent radiation and chemotherapy.  He reports the radiation directed towards his chest caused burning when he swallows. He reports he has had multiple episodes of nonbloody emesis over the past day.  He also reports constipation.  Patient is unable to keep any of his medications down at home No fevers.  Reports previous history of hernia surgery.  Denies any new hernias or lower abdominal pain Past Medical History:  Diagnosis Date   Arthritis    CAD (coronary artery disease)    CABG with LIMA to LAD and RIMA to RCA August 2000 - Dr. Dorris Fetch   Cancer [facility])    basal cell   CHF (congestive heart failure) (HCC)    COPD (chronic obstructive pulmonary disease) (HCC)    Elevated glycated hemoglobin    Essential hypertension    GERD (gastroesophageal reflux disease)    Heart murmur    Hyperlipidemia    Hypopituitarism (HCC)    Lung cancer (HCC)    Osteopenia    Dr Juleen China   Pancreatitis    Peyronie disease    Testosterone deficiency    Dr Juleen China    Home Medications Prior to Admission medications   Medication Sig Start Date End Date Taking? Authorizing Provider  Cholecalciferol (VITAMIN D3) 2000 UNITS TABS Take 2,000 Units by mouth in the morning and at bedtime.   Yes [provider]  Coenzyme Q10 (COQ-10) 100 MG CAPS Take 100 mg by mouth in the morning.   Yes [provider]  Cyanocobalamin (B-12 PO) Take 1 tablet by mouth daily.   Yes [provider]  dexamethasone (DECADRON) 4 MG tablet Take 1 tablet (4 mg total) by mouth daily. 05/16/22  Yes  Zadie Rhine, MD  folic acid (FOLVITE) 1 MG tablet TAKE 1 TABLET BY MOUTH EVERY DAY 04/05/22  Yes Si Gaul, MD  hydrALAZINE (APRESOLINE) 25 MG tablet Take 1 tablet (25 mg total) by mouth 3 (three) times daily. 08/29/21  Yes Burns, Bobette Mo, MD  irbesartan (AVAPRO) 300 MG tablet Take 1 tablet (300 mg total) by mouth daily. 08/29/21  Yes Burns, Bobette Mo, MD  lidocaine (XYLOCAINE) 2 % solution Use as directed 15 mLs in the mouth or throat as needed for mouth pain. Swallow 20 min prior to meals and at bedtime 05/13/22  Yes Antony Blackbird, MD  LORazepam (ATIVAN) 0.5 MG tablet Take 1 tablet (0.5 mg total) by mouth every 8 (eight) hours as needed for anxiety. 05/16/22  Yes Zadie Rhine, MD  nitroGLYCERIN (NITROSTAT) 0.4 MG SL tablet Place 1 tablet (0.4 mg total) under the tongue every 5 (five) minutes x 3 doses as needed for chest pain (if no relief after 2nd dose, proceed to ED or call 911). 02/04/22  Yes Jonelle Sidle, MD  Omega-3 Fatty Acids (OMEGA-3 FISH OIL PO) Take 1,000 mg by mouth in the morning.   Yes [provider]  omeprazole (PRILOSEC) 40 MG capsule Take 1 capsule (40 mg total) by mouth in the morning. 01/10/22  Yes [name], MD  ondansetron [facility]) 8  MG tablet Take 1 tablet (8 mg total) by mouth every 8 (eight) hours as needed for vomiting or nausea. 05/13/22  Yes Antony Blackbird, MD  Oxycodone HCl 20 MG TABS Take 1 tablet (20 mg total) by mouth 4 (four) times daily as needed. For severe pain 05/13/22  Yes Antony Blackbird, MD  Polyethyl Glycol-Propyl Glycol (LUBRICANT EYE DROPS) 0.4-0.3 % SOLN Place 1-2 drops into both eyes 3 (three) times daily as needed (dry/irritated eyes.).   Yes [provider]  prochlorperazine (COMPAZINE) 10 MG tablet Take 1 tablet (10 mg total) by mouth every 6 (six) hours as needed for nausea or vomiting. 05/16/22  Yes Zadie Rhine, MD  rosuvastatin (CRESTOR) 20 MG tablet TAKE HALF A TABLET (10 MG) BY MOUTH ONCE DAILY. 10/08/21  Yes Burns, Bobette Mo, MD  sucralfate (CARAFATE) 1 g tablet Take 1 tablet (1 g total) by mouth 4 (four) times daily -  with meals and at bedtime. Crush and dissolve in 10 mL warm water prior to swallowing, take 20 min before meals 03/26/22  Yes Antony Blackbird, MD  tadalafil (CIALIS) 5 MG tablet Take 5 mg by mouth daily as needed for erectile dysfunction.   Yes [provider]  tamsulosin (FLOMAX) 0.4 MG CAPS capsule Take 0.4 mg by mouth in the morning.   Yes [provider]  Testosterone 20.25 MG/ACT (1.62%) GEL Apply 2 Pump topically in the morning. 07/27/19  Yes [provider]  vitamin C (ASCORBIC ACID) 500 MG tablet Take 500 mg by mouth in the morning.   Yes [provider]  aspirin EC 81 MG tablet Take 1 tablet (81 mg total) by mouth at bedtime. Okay to restart this medication on 07/03/2021 Patient not taking: Reported on 05/16/2022 07/02/21   Patrick Peer, MD  fluconazole (DIFLUCAN) 100 MG tablet Take 1 tablet (100 mg total) by mouth daily. Patient not taking: Reported on 05/16/2022 04/18/22   Antony Blackbird, MD      Allergies    Latex, Bromocriptine, Zestril [lisinopril], Diovan [valsartan], Hydrochlorothiazide, Norvasc [amlodipine], and Zocor [simvastatin]    Review of Systems   Review of Systems  Constitutional:  Negative for fever.  Gastrointestinal:  Positive for constipation and vomiting.    Physical Exam Updated Vital Signs BP 129/63   Pulse 78   Temp 98 F (36.7 C)   Resp (!) 21   Wt 82 kg   SpO2 92%   BMI 24.52 kg/m  Physical Exam CONSTITUTIONAL: elderly, uncomfortable appearing HEAD: Normocephalic/atraumatic EYES: EOMI/PERRL NECK: supple no meningeal signs CV: S1/S2 noted, no murmurs/rubs/gallops noted LUNGS: Lungs are clear to auscultation bilaterally, no apparent distress ABDOMEN: soft, nontender, no rebound or guarding, bowel sounds noted throughout abdomen NEURO: Pt is awake/alert/appropriate, moves all extremitiesx4.  No facial droop.    EXTREMITIES: full ROM SKIN: warm, color normal PSYCH: no abnormalities of mood noted, alert and oriented to situation  ED Results / Procedures / Treatments   Labs (all labs ordered are listed, but only abnormal results are displayed) Labs Reviewed  CBC WITH DIFFERENTIAL/PLATELET - Abnormal; Notable for the following components:      Result Value   RBC 3.28 (*)    Hemoglobin 10.6 (*)    HCT 32.3 (*)    RDW 19.0 (*)    Lymphs Abs 0.4 (*)    Monocytes Absolute 1.1 (*)    All other components within normal limits  COMPREHENSIVE METABOLIC PANEL - Abnormal; Notable for the following components:   Sodium 130 (*)  Glucose, Bld 118 (*)    Calcium 8.3 (*)    Albumin 3.4 (*)    All other components within normal limits  LIPASE, BLOOD    EKG EKG Interpretation  Date/Time:  Thursday May 16 2022 04:40:48 EDT Ventricular Rate:  85 PR Interval:  214 QRS Duration: 88 QT Interval:  350 QTC Calculation: 417 R Axis:   55 Text Interpretation: Sinus rhythm Borderline prolonged PR interval Probable left atrial enlargement Interpretation limited secondary to artifact Confirmed by Zadie Rhine ([id number]) on 05/16/2022 4:59:40 AM  Radiology No results found.  Procedures Procedures    Medications Ordered in ED Medications  dexamethasone (DECADRON) injection 4 mg (has no administration in time range)  sodium chloride 0.9 % bolus 1,000 mL (0 mLs Intravenous Stopped 05/16/22 0712)  ondansetron (ZOFRAN) injection 4 mg (4 mg Intravenous Given 05/16/22 0441)  fentaNYL (SUBLIMAZE) injection 50 mcg (50 mcg Intravenous Given 05/16/22 0505)  alum & mag hydroxide-simeth (MAALOX/MYLANTA) 200-200-20 MG/5ML suspension 30 mL (30 mLs Oral Given 05/16/22 0527)  diphenhydrAMINE (BENADRYL) injection 25 mg (25 mg Intravenous Given 05/16/22 0614)  metoCLOPramide (REGLAN) injection 10 mg (10 mg Intravenous Given 05/16/22 6045)    ED Course/ Medical Decision Making/ A&P Clinical Course as of 05/16/22 0719  Thu May 16, 2022  4098 Patient starting to feel improved.  He would like to try medicines for his burning type pain when he swallows.  He has tried oral lidocaine without relief.  Will trial Maalox [DW]  0526 Hemoglobin(!): 10.6 Mild anemia [DW]  0526 Sodium(!): 130 Mild hyponatremia [DW]  0620 Patient reports his nausea is back and he cannot go home like this.  He is seeking answers for his chronic nausea.  Since Zofran did not work, will progress to Reglan and Benadryl [DW]  [phone] Patient awake and alert, he is afebrile.  He has no abdominal pain to suggest acute bowel obstruction or other intra-abdominal process. [DW]  4782 Patient feeling improved.  He is in no acute distress.  Low suspicion for abdominal obstruction or any other issues.  Discussed the case with Dr. Mosetta Putt with oncology.  She suggest starting Decadron 4 mg daily, can also add on Ativan as needed for nausea.  Prescription for Compazine is also been sent in.  Patient can also follow-up for IV fluids at the cancer center throughout the weekend.  This was discussed extensively with patient and his wife.  They will be given phone number to call if he should be seen this weekend.  He can then follow-up next week with oncology [DW]    Clinical Course User Index [DW] Zadie Rhine, MD                             Medical Decision Making Amount and/or Complexity of Data Reviewed Labs: ordered. Decision-making details documented in ED Course. ECG/medicine tests: ordered.  Risk OTC drugs. Prescription drug management.   This patient presents to the ED for concern of vomiting, this involves an extensive number of treatment options, and is a complaint that carries with it a high risk of complications and morbidity.  The differential diagnosis includes but is not limited to cholecystitis, cholelithiasis, pancreatitis, gastritis, peptic ulcer disease, appendicitis, bowel obstruction, ischemic bowel, acute coronary syndrome   Comorbidities  that complicate the patient evaluation: Patient's presentation is complicated by their history of lung cancer, CAD  Additional history obtained: Additional history obtained from spouse Records reviewed  outpatient records reviewed  Lab Tests: I Ordered, and personally interpreted labs.  The pertinent results include:  mild hyponatremia  Cardiac Monitoring: The patient was maintained on a cardiac monitor.  I personally viewed and interpreted the cardiac monitor which showed an underlying rhythm of:  sinus rhythm  Medicines ordered and prescription drug management: I ordered medication including Zofran for nausea Reevaluation of the patient after these medicines showed that the patient    stayed the same   Consultations Obtained: I requested consultation with the consultant oncology , and discussed  findings as well as pertinent plan - they recommend: Medication recommendations given  Reevaluation: After the interventions noted above, I reevaluated the patient and found that they have :improved  Complexity of problems addressed: Patient's presentation is most consistent with  acute presentation with potential threat to life or bodily function  Disposition: After consideration of the diagnostic results and the patient's response to treatment,  I feel that the patent would benefit from discharge   .           Final Clinical Impression(s) / ED Diagnoses Final diagnoses:  Nausea and vomiting, unspecified vomiting type  Dehydration  Hyponatremia    Rx / DC Orders ED Discharge Orders          Ordered    prochlorperazine (COMPAZINE) 10 MG tablet  Every 6 hours PRN        05/16/22 0658    dexamethasone (DECADRON) 4 MG tablet  Daily        05/16/22 0716    LORazepam (ATIVAN) 0.5 MG tablet  Every 8 hours PRN        05/16/22 0716              Zadie Rhine, MD 05/16/22 0720

## 2022-05-16 NOTE — ED Triage Notes (Signed)
C/o N/v x1 day.  Zofran at 0200 w/o relief.  Pt reports unable to take home pain meds for non-small cell lung cancer due to emesis.  Currently receiving radiation treatment.

## 2022-05-17 ENCOUNTER — Telehealth: Payer: Self-pay

## 2022-05-17 NOTE — Telephone Encounter (Signed)
This nurse reached out to this patient for ED follow up for nausea and vomiting.  Patient states he feels much better.  Does not feel that he needs to come in for hydration at this time.  This nurse encouraged him to continue drinking fluids.  Patient knows to call if there are any changes. No further questions or concerns noted at this time.

## 2022-05-20 ENCOUNTER — Encounter (HOSPITAL_COMMUNITY): Payer: Self-pay

## 2022-05-20 ENCOUNTER — Other Ambulatory Visit: Payer: Self-pay | Admitting: Radiation Oncology

## 2022-05-20 ENCOUNTER — Telehealth: Payer: Self-pay

## 2022-05-20 ENCOUNTER — Ambulatory Visit (HOSPITAL_COMMUNITY)
Admission: RE | Admit: 2022-05-20 | Discharge: 2022-05-20 | Disposition: A | Discharge status: Home / Self Care | Payer: Medicare Other | Source: Ambulatory Visit | Attending: Physician Assistant | Admitting: Physician Assistant

## 2022-05-20 DIAGNOSIS — C3491 Malignant neoplasm of unspecified part of right bronchus or lung: Secondary | ICD-10-CM | POA: Diagnosis not present

## 2022-05-20 DIAGNOSIS — C3490 Malignant neoplasm of unspecified part of unspecified bronchus or lung: Secondary | ICD-10-CM | POA: Diagnosis not present

## 2022-05-20 DIAGNOSIS — J439 Emphysema, unspecified: Secondary | ICD-10-CM | POA: Diagnosis not present

## 2022-05-20 MED ORDER — SODIUM CHLORIDE (PF) 0.9 % IJ SOLN
INTRAMUSCULAR | Status: AC
  Filled 2022-05-20: qty 50

## 2022-05-20 MED ORDER — IOHEXOL 300 MG/ML  SOLN
75.0000 mL | Freq: Once | INTRAMUSCULAR | Status: AC | PRN
  Administered 2022-05-20: 75 mL via INTRAVENOUS

## 2022-05-20 MED ORDER — OXYCODONE HCL 20 MG PO TABS: 20.0000 mg | ORAL_TABLET | Freq: Four times a day (QID) | ORAL | 0 refills | Status: DC | PRN

## 2022-05-20 NOTE — Telephone Encounter (Signed)
Patient called in requesting refill on Oxycodone 20mg . Patient requesting medication be sent to [facility].

## 2022-05-22 ENCOUNTER — Other Ambulatory Visit: Payer: Self-pay | Admitting: Medical Oncology

## 2022-05-22 ENCOUNTER — Encounter: Payer: Self-pay | Admitting: Internal Medicine

## 2022-05-22 ENCOUNTER — Inpatient Hospital Stay: Payer: Medicare Other

## 2022-05-22 ENCOUNTER — Inpatient Hospital Stay: Payer: Medicare Other | Admitting: Internal Medicine

## 2022-05-22 VITALS — BP 134/74 | HR 80 | Temp 98.0°F | Resp 17 | Wt 174.1 lb

## 2022-05-22 DIAGNOSIS — C3491 Malignant neoplasm of unspecified part of right bronchus or lung: Secondary | ICD-10-CM

## 2022-05-22 DIAGNOSIS — Z5112 Encounter for antineoplastic immunotherapy: Secondary | ICD-10-CM | POA: Diagnosis not present

## 2022-05-22 DIAGNOSIS — E860 Dehydration: Secondary | ICD-10-CM | POA: Diagnosis not present

## 2022-05-22 DIAGNOSIS — Z79620 Long term (current) use of immunosuppressive biologic: Secondary | ICD-10-CM | POA: Diagnosis not present

## 2022-05-22 DIAGNOSIS — R1310 Dysphagia, unspecified: Secondary | ICD-10-CM | POA: Diagnosis not present

## 2022-05-22 DIAGNOSIS — C3412 Malignant neoplasm of upper lobe, left bronchus or lung: Secondary | ICD-10-CM | POA: Diagnosis not present

## 2022-05-22 DIAGNOSIS — C3411 Malignant neoplasm of upper lobe, right bronchus or lung: Secondary | ICD-10-CM | POA: Diagnosis not present

## 2022-05-22 LAB — CMP (CANCER CENTER ONLY)
ALT: 14 U/L (ref 0–44)
AST: 15 U/L (ref 15–41)
Albumin: 3.5 g/dL (ref 3.5–5.0)
Alkaline Phosphatase: 56 U/L (ref 38–126)
Anion gap: 6 (ref 5–15)
BUN: 19 mg/dL (ref 8–23)
CO2: 29 mmol/L (ref 22–32)
Calcium: 8.8 mg/dL — ABNORMAL LOW (ref 8.9–10.3)
Chloride: 103 mmol/L (ref 98–111)
Creatinine: 0.99 mg/dL (ref 0.61–1.24)
GFR, Estimated: >60 mL/min (ref >60)
Glucose, Bld: 114 mg/dL — ABNORMAL HIGH (ref 70–99)
Potassium: 4 mmol/L (ref 3.5–5.1)
Sodium: 138 mmol/L (ref 135–145)
Total Bilirubin: 0.3 mg/dL (ref 0.3–1.2)
Total Protein: 6.4 g/dL — ABNORMAL LOW (ref 6.5–8.1)

## 2022-05-22 LAB — CBC WITH DIFFERENTIAL (CANCER CENTER ONLY)
Abs Immature Granulocytes: 0.04 10*3/uL (ref 0.00–0.07)
Basophils Absolute: 0 10*3/uL (ref 0.0–0.1)
Basophils Relative: 1 %
Eosinophils Absolute: 0.1 10*3/uL (ref 0.0–0.5)
Eosinophils Relative: 2 %
HCT: 35.6 % — ABNORMAL LOW (ref 39.0–52.0)
Hemoglobin: 11.7 g/dL — ABNORMAL LOW (ref 13.0–17.0)
Immature Granulocytes: 1 %
Lymphocytes Relative: 12 %
Lymphs Abs: 0.6 10*3/uL — ABNORMAL LOW (ref 0.7–4.0)
MCH: 32.6 pg (ref 26.0–34.0)
MCHC: 32.9 g/dL (ref 30.0–36.0)
MCV: 99.2 fL (ref 80.0–100.0)
Monocytes Absolute: 0.9 10*3/uL (ref 0.1–1.0)
Monocytes Relative: 18 %
Neutro Abs: 3.4 10*3/uL (ref 1.7–7.7)
Neutrophils Relative %: 66 %
Platelet Count: 153 10*3/uL (ref 150–400)
RBC: 3.59 MIL/uL — ABNORMAL LOW (ref 4.22–5.81)
RDW: 18.2 % — ABNORMAL HIGH (ref 11.5–15.5)
WBC Count: 5.1 10*3/uL (ref 4.0–10.5)
nRBC: 0 % (ref 0.0–0.2)

## 2022-05-22 MED ORDER — ONDANSETRON HCL 4 MG/2ML IJ SOLN
8.0000 mg | Freq: Once | INTRAMUSCULAR | Status: AC
  Administered 2022-05-22: 8 mg via INTRAVENOUS
  Filled 2022-05-22: qty 4

## 2022-05-22 MED ORDER — SODIUM CHLORIDE 0.9 % IV SOLN: INTRAVENOUS | Status: DC

## 2022-05-22 MED ORDER — OXYCODONE HCL 5 MG PO TABS
20.0000 mg | ORAL_TABLET | Freq: Once | ORAL | Status: AC
  Administered 2022-05-22: 20 mg via ORAL
  Filled 2022-05-22: qty 4

## 2022-05-22 NOTE — Progress Notes (Signed)
DISCONTINUE OFF PATHWAY REGIMEN - Non-Small Cell Lung   [id number]:Carboplatin AUC=5 + Pemetrexed 500 mg/m2 q21 Days:   A cycle is every 21 days:     Pemetrexed      Carboplatin   **Always confirm dose/schedule in your pharmacy ordering system**  REASON: Toxicities / Adverse Event PRIOR TREATMENT: Off Pathway: Carboplatin AUC=5 + Pemetrexed 500 mg/m2 q21 Days TREATMENT RESPONSE: Partial Response (PR)  START ON PATHWAY REGIMEN - Non-Small Cell Lung     A cycle is every 28 days:     Durvalumab   **Always confirm dose/schedule in your pharmacy ordering system**  Patient Characteristics: Preoperative or Nonsurgical Candidate (Clinical Staging), Stage III - Nonsurgical Candidate (Nonsquamous and Squamous), PS = 0, 1 Therapeutic Status: Preoperative or Nonsurgical Candidate (Clinical Staging) AJCC T Category: cT2a AJCC N Category: cN2 AJCC M Category: cM0 AJCC 8 Stage Grouping: IIIA ECOG Performance Status: 1 Intent of Therapy: Curative Intent, Discussed with Patient

## 2022-05-22 NOTE — Progress Notes (Signed)
[facility] [phone]   [phone]  OFFICE PROGRESS NOTE  [name], MD [address] [place] [address]  DIAGNOSIS:  Stage IIIA (T1c, N 2, M0) adenocarcinoma in the right upper lobe as well as stage IA (T1c, N0, M0) squamous cell carcinoma involving the right upper lobe and stage IA (T1b, N0, M0) adenocarcinoma involving the left upper lobe diagnosed in June 2023.  PRIOR THERAPY:  1) status post neoadjuvant systemic chemotherapy with carboplatin, paclitaxel and nivolumab for 3 cycles in [facility] followed by right upper lobectomy with lymph node dissection at [facility] under the care of Dr. Glenna Durand with the final pathology showing residual disease with synchronous adenocarcinoma as well as squamous cell carcinoma in the right upper lobe and multiple mediastinal lymph node involvement performed on November 13, 2021. 2)  evidence for disease recurrence with involvement of 4R lymphadenopathy consistent with metastatic adenocarcinoma on February 26, 2022 biopsy. Molecular studies showed no actionable mutations and PD-L1 expression was 15% in the adenocarcinoma in 2% in the squamous cell carcinoma. 3) Concurrent chemotherapy with carboplatin for AUC of 5 and Alimta 500 Mg/M2 every 3 weeks for 2 cycles during the course of radiotherapy.  Last dose was giving 04/08/2022.  CURRENT THERAPY: Consolidation treatment with immunotherapy with Imfinzi 1500 Mg IV every 4 weeks.  First dose May 29, 2022  INTERVAL HISTORY: Patrick Harris 77 y.o. male returns to the clinic today for follow-up visit accompanied by his wife.  The patient is feeling fine today with no concerning complaints except for intermittent nausea as well as dizzy spells especially when standing.  He continues to have odynophagia secondary to radiation-induced esophagitis.  He lost several pounds since his last visit.  He denied having any fever or chills.  He has no chest  pain, shortness of breath but has dry cough with no hemoptysis.  He tolerated the previous course of concurrent chemoradiation fairly well except for the fatigue and odynophagia.  He is here today for evaluation with repeat CT scan of the chest for restaging of his disease.  MEDICAL HISTORY: Past Medical History:  Diagnosis Date   Arthritis    CAD (coronary artery disease)    CABG with LIMA to LAD and RIMA to RCA August 2000 - Dr. Dorris Fetch   Cancer [facility])    basal cell   CHF (congestive heart failure) (HCC)    COPD (chronic obstructive pulmonary disease) (HCC)    Elevated glycated hemoglobin    Essential hypertension    GERD (gastroesophageal reflux disease)    Heart murmur    Hyperlipidemia    Hypopituitarism (HCC)    Lung cancer (HCC)    Osteopenia    Dr Juleen China   Pancreatitis    Peyronie disease    Testosterone deficiency    Dr Juleen China    ALLERGIES:  is allergic to latex, bromocriptine, zestril [lisinopril], diovan [valsartan], hydrochlorothiazide, norvasc [amlodipine], and zocor [simvastatin].  MEDICATIONS:  Current Outpatient Medications  Medication Sig Dispense Refill   aspirin EC 81 MG tablet Take 1 tablet (81 mg total) by mouth at bedtime. Okay to restart this medication on 07/03/2021 (Patient not taking: Reported on 05/16/2022) 30 tablet 11   Cholecalciferol (VITAMIN D3) 2000 UNITS TABS Take 2,000 Units by mouth in the morning and at bedtime.     Coenzyme Q10 (COQ-10) 100 MG CAPS Take 100 mg by mouth in the morning.     Cyanocobalamin (B-12 PO) Take  1 tablet by mouth daily.     dexamethasone (DECADRON) 4 MG tablet Take 1 tablet (4 mg total) by mouth daily. 4 tablet 0   fluconazole (DIFLUCAN) 100 MG tablet Take 1 tablet (100 mg total) by mouth daily. (Patient not taking: Reported on 05/16/2022) 7 tablet 0   folic acid (FOLVITE) 1 MG tablet TAKE 1 TABLET BY MOUTH EVERY DAY 90 tablet 1   hydrALAZINE (APRESOLINE) 25 MG tablet Take 1 tablet (25 mg total) by mouth 3 (three) times  daily. 270 tablet 3   irbesartan (AVAPRO) 300 MG tablet Take 1 tablet (300 mg total) by mouth daily. 90 tablet 3   lidocaine (XYLOCAINE) 2 % solution Use as directed 15 mLs in the mouth or throat as needed for mouth pain. Swallow 20 min prior to meals and at bedtime 250 mL 2   LORazepam (ATIVAN) 0.5 MG tablet Take 1 tablet (0.5 mg total) by mouth every 8 (eight) hours as needed for anxiety. 10 tablet 0   nitroGLYCERIN (NITROSTAT) 0.4 MG SL tablet Place 1 tablet (0.4 mg total) under the tongue every 5 (five) minutes x 3 doses as needed for chest pain (if no relief after 2nd dose, proceed to ED or call 911). 25 tablet 3   Omega-3 Fatty Acids (OMEGA-3 FISH OIL PO) Take 1,000 mg by mouth in the morning.     omeprazole (PRILOSEC) 40 MG capsule Take 1 capsule (40 mg total) by mouth in the morning.     ondansetron (ZOFRAN) 8 MG tablet Take 1 tablet (8 mg total) by mouth every 8 (eight) hours as needed for vomiting or nausea. 20 tablet 2   Oxycodone HCl 20 MG TABS Take 1 tablet (20 mg total) by mouth 4 (four) times daily as needed. For severe pain 30 tablet 0   Polyethyl Glycol-Propyl Glycol (LUBRICANT EYE DROPS) 0.4-0.3 % SOLN Place 1-2 drops into both eyes 3 (three) times daily as needed (dry/irritated eyes.).     prochlorperazine (COMPAZINE) 10 MG tablet Take 1 tablet (10 mg total) by mouth every 6 (six) hours as needed for nausea or vomiting. 20 tablet 0   rosuvastatin (CRESTOR) 20 MG tablet TAKE HALF A TABLET (10 MG) BY MOUTH ONCE DAILY. 45 tablet 3   sucralfate (CARAFATE) 1 g tablet Take 1 tablet (1 g total) by mouth 4 (four) times daily -  with meals and at bedtime. Crush and dissolve in 10 mL warm water prior to swallowing, take 20 min before meals 120 tablet 1   tadalafil (CIALIS) 5 MG tablet Take 5 mg by mouth daily as needed for erectile dysfunction.     tamsulosin (FLOMAX) 0.4 MG CAPS capsule Take 0.4 mg by mouth in the morning.     Testosterone 20.25 MG/ACT (1.62%) GEL Apply 2 Pump topically in  the morning.     vitamin C (ASCORBIC ACID) 500 MG tablet Take 500 mg by mouth in the morning.     No current facility-administered medications for this visit.    SURGICAL HISTORY:  Past Surgical History:  Procedure Laterality Date   Bone spur  2006   5th toe bilaterally   BRONCHIAL BIOPSY  07/02/2021   Procedure: BRONCHIAL BIOPSIES;  Surgeon: Leslye Peer, MD;  Location: [facility] ENDOSCOPY;  Service: Pulmonary;;   BRONCHIAL BRUSHINGS  07/02/2021   Procedure: BRONCHIAL BRUSHINGS;  Surgeon: Leslye Peer, MD;  Location[facility] END[facility]COPY;  Service: Pulmonary;;   BRONCHIAL NEEDLE ASPIRATION BIOPSY  07/02/2021   Procedure: BRONCHIAL NEEDLE ASPIRATION BIOPSIES;  Surgeon: Leslye Peer, MD;  Location: [facility] ENDOSCOPY;  Service: Pulmonary;;   COLONOSCOPY  2007   negative; Dr Jarold Motto   CORONARY ARTERY BYPASS GRAFT  2001   2 vessels   FIDUCIAL MARKER PLACEMENT  07/02/2021   Procedure: FIDUCIAL MARKER PLACEMENT;  Surgeon: Leslye Peer, MD;  Location: [facility] ENDOSCOPY;  Service: Pulmonary;;   fracture heel  1967   Bil/ due to fall off ladder   Fractured calcaneus  1969   bilaterally w/ ankle fusion   HERNIA REPAIR  1984   right inguinal hernia   KNEE SURGERY Left    LOBECTOMY Right 11/13/2021   MENISECTOMY  2007   R medial, Dr. Ophelia Charter   PORTA CATH INSERTION     SHOULDER SURGERY  1994   left   UPPER GASTROINTESTINAL ENDOSCOPY  2007   GERD, Dr Jarold Motto   VASECTOMY     VIDEO BRONCHOSCOPY WITH RADIAL ENDOBRONCHIAL ULTRASOUND  07/02/2021   Procedure: VIDEO BRONCHOSCOPY WITH RADIAL ENDOBRONCHIAL ULTRASOUND;  Surgeon: Leslye Peer, MD;  Location: [facility] ENDOSCOPY;  Service: Pulmonary;;    REVIEW OF SYSTEMS:  Constitutional: positive for fatigue and weight loss Eyes: negative Ears, nose, mouth, throat, and face: positive for sore throat Respiratory: positive for cough Cardiovascular: negative Gastrointestinal: negative Genitourinary:negative Integument/breast: negative Hematologic/lymphatic:  negative Musculoskeletal:negative Neurological: negative Behavioral/Psych: negative Endocrine: negative Allergic/Immunologic: negative   PHYSICAL EXAMINATION: General appearance: alert, cooperative, fatigued, and no distress Head: Normocephalic, without obvious abnormality, atraumatic Neck: no adenopathy, no JVD, supple, symmetrical, trachea midline, and thyroid not enlarged, symmetric, no tenderness/mass/nodules Lymph nodes: Cervical, supraclavicular, and axillary nodes normal. Resp: clear to auscultation bilaterally Back: symmetric, no curvature. ROM normal. No CVA tenderness. Cardio: regular rate and rhythm, S1, S2 normal, no murmur, click, rub or gallop GI: soft, non-tender; bowel sounds normal; no masses,  no organomegaly Extremities: extremities normal, atraumatic, no cyanosis or edema Neurologic: Alert and oriented X 3, normal strength and tone. Normal symmetric reflexes. Normal coordination and gait  ECOG PERFORMANCE STATUS: 1 - Symptomatic but completely ambulatory  Blood pressure 134/74, pulse 80, temperature 98 F (36.7 C), temperature source Oral, resp. rate 17, weight 174 lb 1.6 oz (79 kg), SpO2 97 %.  LABORATORY DATA: Lab Results  Component Value Date   WBC 5.1 05/22/2022   HGB 11.7 (L) 05/22/2022   HCT 35.6 (L) 05/22/2022   MCV 99.2 05/22/2022   PLT 153 05/22/2022      Chemistry      Component Value Date/Time   NA 130 (L) 05/16/2022 0430   K 3.8 05/16/2022 0430   CL 99 05/16/2022 0430   CO2 25 05/16/2022 0430   BUN 16 05/16/2022 0430   CREATININE 0.88 05/16/2022 0430   CREATININE 0.92 05/08/2022 0849   CREATININE 1.07 08/25/2019 0847      Component Value Date/Time   CALCIUM 8.3 (L) 05/16/2022 0430   ALKPHOS 50 05/16/2022 0430   AST 18 05/16/2022 0430   AST 20 05/08/2022 0849   ALT 15 05/16/2022 0430   ALT 14 05/08/2022 0849   BILITOT 0.9 05/16/2022 0430   BILITOT 0.5 05/08/2022 0849       RADIOGRAPHIC STUDIES: CT Chest W Contrast  Result  Date: 05/20/2022 CLINICAL DATA:  Non-small cell lung cancer. Assess treatment response. Previous surgery. Chemotherapy and radiation therapy completed. * Tracking Code: BO *. EXAM: CT CHEST WITH CONTRAST TECHNIQUE: Multidetector CT imaging of the chest was performed during intravenous contrast administration. RADIATION DOSE REDUCTION: This exam was performed according to the departmental  dose-optimization program which includes automated exposure control, adjustment of the mA and/or kV according to patient size and/or use of iterative reconstruction technique. CONTRAST:  75mL OMNIPAQUE IOHEXOL 300 MG/ML  SOLN COMPARISON:  PET-CT 07/23/2021.  Chest CT 11/06/2021. FINDINGS: Cardiovascular: Atherosclerosis of the aorta, great vessels and coronary arteries status post median sternotomy and CABG. No acute vascular findings are demonstrated. Right IJ Port-A-Cath extends to the lower SVC level. The heart size is normal. There is no pericardial effusion. Mediastinum/Nodes: Interval increased soft tissue thickening around the trachea and right proximal bronchi without focal enlarged mediastinal, hilar or axillary lymph nodes. The thyroid gland and esophagus appear unremarkable. Lungs/Pleura: No pleural effusion or pneumothorax. Interval right upper lobectomy. Underlying advanced centrilobular and paraseptal emphysema with stable partially calcified subpleural nodularity posteriorly in the right lower lobe. More inferiorly in the right lower lobe, there are new ill-defined ground-glass opacities which are likely infectious or treatment related. No suspicious pulmonary nodules. Upper abdomen: The visualized upper abdomen appears stable, without significant findings. Musculoskeletal/Chest wall: There is no chest wall mass or suspicious osseous finding. Intact median sternotomy. Mild spondylosis. IMPRESSION: 1. Interval right upper lobectomy. 2. Interval increased soft tissue thickening around the trachea and right proximal  bronchi, likely treatment related. No focal enlarged lymph nodes or suspicious pulmonary nodules. 3. New ill-defined ground-glass opacities in the right lower lobe, likely infectious or treatment related. 4. Aortic Atherosclerosis (ICD10-I70.0) and Emphysema (ICD10-J43.9). Electronically Signed   By: Carey Bullocks M.D.   On: 05/20/2022 15:07    ASSESSMENT AND PLAN: This is a very pleasant 77 years old white male with Stage IIIA (T1c, N 2, M0) adenocarcinoma in the right upper lobe as well as stage IA (T1c, N0, M0) squamous cell carcinoma involving the right upper lobe and stage IA (T1b, N0, M0) adenocarcinoma involving the left upper lobe diagnosed in June 2023. The patient is status post the following treatment: 1) status post neoadjuvant systemic chemotherapy with carboplatin, paclitaxel and nivolumab for 3 cycles in [facility] followed by right upper lobectomy with lymph node dissection at [facility] under the care of Dr. Glenna Durand with the final pathology showing residual disease with synchronous adenocarcinoma as well as squamous cell carcinoma in the right upper lobe and multiple mediastinal lymph node involvement performed on November 13, 2021. 2)  evidence for disease recurrence with involvement of 4R lymphadenopathy consistent with metastatic adenocarcinoma on February 26, 2022 biopsy. Molecular studies showed no actionable mutations and PD-L1 expression was 15% in the adenocarcinoma in 2% in the squamous cell carcinoma. 3) Concurrent chemotherapy with carboplatin for AUC of 5 and Alimta 500 Mg/M2 every 3 weeks for 2 cycles during the course of radiotherapy.  Last dose was giving 04/08/2022. He tolerated his treatment well except for the chemotherapy-induced odynophagia in addition to fatigue and sore throat. He had repeat CT scan of the chest performed recently that showed no concerning findings for disease progression. I recommended for him to proceed with maintenance treatment with  Imfinzi 1500 Mg IV every 4 weeks.  I discussed with the patient the adverse effect of this treatment including but not limited to immunotherapy mediated skin rash, diarrhea, inflammation of the lung, kidney, liver, thyroid or other endocrine dysfunction. He is expected to start the first dose of this treatment next week. I will see him back for follow-up visit in 5 weeks with the start of cycle #2. For the dehydration and sore throat, we will arrange for the patient to receive 1 L of  normal saline with 8 mg of Zofran IV today. He will continue his current treatment with Carafate and lidocaine for now. He was advised to call immediately if he has any other concerning symptoms in the interval. The patient voices understanding of current disease status and treatment options and is in agreement with the current care plan.  All questions were answered. The patient knows to call the clinic with any problems, questions or concerns. We can certainly see the patient much sooner if necessary.  The total time spent in the appointment was 40 minutes.  Disclaimer: This note was dictated with voice recognition software. Similar sounding words can inadvertently be transcribed and may not be corrected upon review.

## 2022-05-22 NOTE — Progress Notes (Signed)
IVF and zofran ordered.

## 2022-05-24 ENCOUNTER — Telehealth: Payer: Self-pay | Admitting: Internal Medicine

## 2022-05-24 NOTE — Telephone Encounter (Signed)
Called patient regarding May/June appointments, patient is notified.  

## 2022-05-28 ENCOUNTER — Other Ambulatory Visit: Payer: Medicare Other

## 2022-05-28 ENCOUNTER — Inpatient Hospital Stay: Payer: Medicare Other

## 2022-05-28 VITALS — BP 120/60 | HR 72 | Temp 98.1°F | Resp 18 | Wt 176.5 lb

## 2022-05-28 DIAGNOSIS — C3412 Malignant neoplasm of upper lobe, left bronchus or lung: Secondary | ICD-10-CM | POA: Diagnosis not present

## 2022-05-28 DIAGNOSIS — C3491 Malignant neoplasm of unspecified part of right bronchus or lung: Secondary | ICD-10-CM

## 2022-05-28 DIAGNOSIS — E860 Dehydration: Secondary | ICD-10-CM | POA: Diagnosis not present

## 2022-05-28 DIAGNOSIS — Z79620 Long term (current) use of immunosuppressive biologic: Secondary | ICD-10-CM | POA: Diagnosis not present

## 2022-05-28 DIAGNOSIS — R1310 Dysphagia, unspecified: Secondary | ICD-10-CM | POA: Diagnosis not present

## 2022-05-28 DIAGNOSIS — Z95828 Presence of other vascular implants and grafts: Secondary | ICD-10-CM

## 2022-05-28 DIAGNOSIS — Z5112 Encounter for antineoplastic immunotherapy: Secondary | ICD-10-CM | POA: Diagnosis not present

## 2022-05-28 DIAGNOSIS — C3411 Malignant neoplasm of upper lobe, right bronchus or lung: Secondary | ICD-10-CM | POA: Diagnosis not present

## 2022-05-28 LAB — CMP (CANCER CENTER ONLY)
ALT: 11 U/L (ref 0–44)
AST: 13 U/L — ABNORMAL LOW (ref 15–41)
Albumin: 3.3 g/dL — ABNORMAL LOW (ref 3.5–5.0)
Alkaline Phosphatase: 51 U/L (ref 38–126)
Anion gap: 4 — ABNORMAL LOW (ref 5–15)
BUN: 15 mg/dL (ref 8–23)
CO2: 30 mmol/L (ref 22–32)
Calcium: 8.3 mg/dL — ABNORMAL LOW (ref 8.9–10.3)
Chloride: 101 mmol/L (ref 98–111)
Creatinine: 0.88 mg/dL (ref 0.61–1.24)
GFR, Estimated: >60 mL/min (ref >60)
Glucose, Bld: 106 mg/dL — ABNORMAL HIGH (ref 70–99)
Potassium: 4.3 mmol/L (ref 3.5–5.1)
Sodium: 135 mmol/L (ref 135–145)
Total Bilirubin: 0.4 mg/dL (ref 0.3–1.2)
Total Protein: 6.2 g/dL — ABNORMAL LOW (ref 6.5–8.1)

## 2022-05-28 LAB — CBC WITH DIFFERENTIAL (CANCER CENTER ONLY)
Abs Immature Granulocytes: 0.03 10*3/uL (ref 0.00–0.07)
Basophils Absolute: 0 10*3/uL (ref 0.0–0.1)
Basophils Relative: 0 %
Eosinophils Absolute: 0.1 10*3/uL (ref 0.0–0.5)
Eosinophils Relative: 3 %
HCT: 29.9 % — ABNORMAL LOW (ref 39.0–52.0)
Hemoglobin: 10.1 g/dL — ABNORMAL LOW (ref 13.0–17.0)
Immature Granulocytes: 1 %
Lymphocytes Relative: 13 %
Lymphs Abs: 0.6 10*3/uL — ABNORMAL LOW (ref 0.7–4.0)
MCH: 33.1 pg (ref 26.0–34.0)
MCHC: 33.8 g/dL (ref 30.0–36.0)
MCV: 98 fL (ref 80.0–100.0)
Monocytes Absolute: 0.8 10*3/uL (ref 0.1–1.0)
Monocytes Relative: 18 %
Neutro Abs: 2.9 10*3/uL (ref 1.7–7.7)
Neutrophils Relative %: 65 %
Platelet Count: 121 10*3/uL — ABNORMAL LOW (ref 150–400)
RBC: 3.05 MIL/uL — ABNORMAL LOW (ref 4.22–5.81)
RDW: 17.1 % — ABNORMAL HIGH (ref 11.5–15.5)
WBC Count: 4.5 10*3/uL (ref 4.0–10.5)
nRBC: 0 % (ref 0.0–0.2)

## 2022-05-28 LAB — TSH: TSH: 1.576 u[IU]/mL (ref 0.350–4.500)

## 2022-05-28 MED ORDER — SODIUM CHLORIDE 0.9 % IV SOLN
1500.0000 mg | Freq: Once | INTRAVENOUS | Status: AC
  Administered 2022-05-28: 1500 mg via INTRAVENOUS
  Filled 2022-05-28: qty 30

## 2022-05-28 MED ORDER — HEPARIN SOD (PORK) LOCK FLUSH 100 UNIT/ML IV SOLN
500.0000 [IU] | Freq: Once | INTRAVENOUS | Status: AC | PRN
  Administered 2022-05-28: 500 [IU]

## 2022-05-28 MED ORDER — SODIUM CHLORIDE 0.9% FLUSH
10.0000 mL | Freq: Once | INTRAVENOUS | Status: AC
  Administered 2022-05-28: 10 mL

## 2022-05-28 MED ORDER — SODIUM CHLORIDE 0.9 % IV SOLN: Freq: Once | INTRAVENOUS | Status: AC

## 2022-05-28 MED ORDER — SODIUM CHLORIDE 0.9% FLUSH
10.0000 mL | INTRAVENOUS | Status: DC | PRN
  Administered 2022-05-28: 10 mL

## 2022-05-28 NOTE — Patient Instructions (Signed)
[facility]  Discharge Instructions: Thank you for choosing [facility] to provide your oncology and hematology care.   If you have a lab appointment with the Cancer Center, please go directly to the Cancer Center and check in at the registration area.   Wear comfortable clothing and clothing appropriate for easy access to any Portacath or PICC line.   We strive to give you quality time with your provider. You may need to reschedule your appointment if you arrive late (15 or more minutes).  Arriving late affects you and other patients whose appointments are after yours.  Also, if you miss three or more appointments without notifying the office, you may be dismissed from the clinic at the provider's discretion.      For prescription refill requests, have your pharmacy contact our office and allow 72 hours for refills to be completed.    Today you received the following chemotherapy and/or immunotherapy agents imfinzi      To help prevent nausea and vomiting after your treatment, we encourage you to take your nausea medication as directed.  BELOW ARE SYMPTOMS THAT SHOULD BE REPORTED IMMEDIATELY: *FEVER GREATER THAN 100.4 F (38 C) OR HIGHER *CHILLS OR SWEATING *NAUSEA AND VOMITING THAT IS NOT CONTROLLED WITH YOUR NAUSEA MEDICATION *UNUSUAL SHORTNESS OF BREATH *UNUSUAL BRUISING OR BLEEDING *URINARY PROBLEMS (pain or burning when urinating, or frequent urination) *BOWEL PROBLEMS (unusual diarrhea, constipation, pain near the anus) TENDERNESS IN MOUTH AND THROAT WITH OR WITHOUT PRESENCE OF ULCERS (sore throat, sores in mouth, or a toothache) UNUSUAL RASH, SWELLING OR PAIN  UNUSUAL VAGINAL DISCHARGE OR ITCHING   Items with * indicate a potential emergency and should be followed up as soon as possible or go to the Emergency Department if any problems should occur.  Please show the CHEMOTHERAPY ALERT CARD or IMMUNOTHERAPY ALERT CARD at check-in  to the Emergency Department and triage nurse.  Should you have questions after your visit or need to cancel or reschedule your appointment, please contact [facility]  Dept: [phone]  and follow the prompts.  Office hours are 8:00 a.m. to 4:30 p.m. Monday - Friday. Please note that voicemails left after 4:00 p.m. may not be returned until the following business day.  We are closed weekends and major holidays. You have access to a nurse at all times for urgent questions. Please call the main number to the clinic Dept: [phone] and follow the prompts.   For any non-urgent questions, you may also contact your provider using MyChart. We now offer e-Visits for anyone 18 and older to request care online for non-urgent symptoms. For details visit [email or web address].   Also download the MyChart app! Go to the app store, search "MyChart", open the app, select [facility], and log in with your MyChart username and password.   

## 2022-05-29 ENCOUNTER — Encounter: Payer: Self-pay | Admitting: Radiation Oncology

## 2022-05-29 NOTE — Progress Notes (Signed)
  Radiation Oncology         [phone] ________________________________  Name: Patrick Harris MRN: [id number]  Date: 05/30/2022  DOB: 10-30-1945  End of Treatment Note  Diagnosis:  The primary encounter diagnosis was Malignant neoplasm of right upper lobe of lung (HCC). A diagnosis of Non-small cell cancer of right lung [facility]) was also pertinent to this visit.   Synchronous primaries of adenocarcinoma & squamous cell carcinoma of the RUL, and non-small cell carcinoma favoring adenocarcinoma of LUL: s/p neoadjuvant chemotherapy and right upper lobectomy w/ lymphadenectomy     Recent PET scan with an intensely hypermetabolic precarinal node concerning for metastasis (January 2024)     Indication for treatment: Curative       Radiation treatment dates: 03/19/22 through 05/06/22  Site/dose: Chest - 60 Gy delivered in 30 Fx at 2 Gy/Fx  Beams/energy: 6X, 10X  Technique: 3D  Narrative: The patient tolerated radiation treatment relatively well. On the date of his final treatment, the patient endorsed fatigue, ongoing chest pain, a productive cough, increasing esophageal symptoms, and increasing nausea which was not relieved with Zofran. For his esophageal symptoms, he was advised to continue taking oxycodone at 20 mg every 6 hours.   Plan: The patient has completed radiation treatment. The patient will return to radiation oncology clinic for routine followup in one month. I advised them to call or return sooner if they have any questions or concerns related to their recovery or treatment.  -----------------------------------  Billie Lade, PhD, MD  This document serves as a record of services personally performed by Antony Blackbird, MD. It was created on his behalf by Neena Rhymes, a trained medical scribe. The creation of this record is based on the scribe's personal observations and the provider's statements to them. This document has been checked and approved by the attending  provider.

## 2022-05-29 NOTE — Progress Notes (Signed)
Radiation Oncology         [phone] ________________________________  Name: Patrick Harris MRN: [id number]  Date: 05/30/2022  DOB: 09-05-1945  Follow-Up Visit Note  CC: [name], MD  [name],*  No diagnosis found.  Diagnosis: The primary encounter diagnosis was Malignant neoplasm of right upper lobe of lung (HCC). A diagnosis of Non-small cell cancer of right lung [facility]) was also pertinent to this visit.   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 The patient tolerated radiation treatment relatively well. On the date of his final treatment, the patient endorsed fatigue, ongoing chest pain, a productive cough, increasing esophageal symptoms, and increasing nausea which was not relieved with Zofran. For his esophageal symptoms, he was advised to continue taking oxycodone at 20 mg every 6 hours.        The patient has now also completed concurrent chemotherapy with carboplatin and Alimta. This was given every 3 weeks for a total of 2 cycles during the course of radiotherapy.  He received his last dose on 04/08/2022. As noted above, his main side effect from chemoradiation was esophagitis. Carafate did help somewhat, however oxycodone was most effective in managing this.   Shortly after completing radiation therapy, the patient presented to the ED on 05/16/22 with nausea and vomiting to the point  where he was unable to keep any of his medications down. He was initially given Zofran without improvement. Oncology was consulted and recommended Reglan and Benadryl. His symptoms improved and he was discharged home with compazine, decadron, and PRN ativan.   He is now on consolidation treatment with immunotherapy - Imfinzi. This will be given every 4 weeks. He received her first dose on 05/28/22.   Pertinent imaging performed in the interval since his initial consultation date and thus far are detailed as follows:  -- MRI of the brain on 03/18/22 demonstrated no evidence of intracranial metastatic disease. (A few punctate chronic microhemorrhages scattered within the supratentorial brain were appreciated, as well as mild generalized cerebral atrophy).  -- Restaging chest CT on 05/20/22 demonstrated interval increased soft tissue thickening around the trachea and right proximal bronchi which are likely treatment related, and a new ill-defined ground glass opacities in the RLL which is likely infectious or treatment related in etiology. CT otherwise showed no focal enlarged lymph nodes or suspicious pulmonary nodules.   ***                          Allergies:  is allergic to latex, bromocriptine, zestril [lisinopril], diovan [valsartan], hydrochlorothiazide, norvasc [amlodipine], and zocor [simvastatin].  Meds: Current Outpatient Medications  Medication Sig Dispense Refill   aspirin EC 81 MG tablet Take 1 tablet (81 mg total) by mouth at bedtime. Okay to restart this medication on 07/03/2021 (Patient not taking: Reported on 05/16/2022) 30 tablet 11   Cholecalciferol (VITAMIN D3) 2000 UNITS TABS Take 2,000 Units by mouth in  the morning and at bedtime.     Coenzyme Q10 (COQ-10) 100 MG CAPS Take 100 mg by mouth in the morning.     Cyanocobalamin (B-12 PO) Take 1 tablet by mouth daily.     dexamethasone (DECADRON) 4 MG tablet Take 1 tablet (4 mg total) by mouth daily. 4 tablet 0   fluconazole  (DIFLUCAN) 100 MG tablet Take 1 tablet (100 mg total) by mouth daily. (Patient not taking: Reported on 05/16/2022) 7 tablet 0   folic acid (FOLVITE) 1 MG tablet TAKE 1 TABLET BY MOUTH EVERY DAY 90 tablet 1   hydrALAZINE (APRESOLINE) 25 MG tablet Take 1 tablet (25 mg total) by mouth 3 (three) times daily. 270 tablet 3   irbesartan (AVAPRO) 300 MG tablet Take 1 tablet (300 mg total) by mouth daily. 90 tablet 3   lidocaine (XYLOCAINE) 2 % solution Use as directed 15 mLs in the mouth or throat as needed for mouth pain. Swallow 20 min prior to meals and at bedtime 250 mL 2   LORazepam (ATIVAN) 0.5 MG tablet Take 1 tablet (0.5 mg total) by mouth every 8 (eight) hours as needed for anxiety. 10 tablet 0   nitroGLYCERIN (NITROSTAT) 0.4 MG SL tablet Place 1 tablet (0.4 mg total) under the tongue every 5 (five) minutes x 3 doses as needed for chest pain (if no relief after 2nd dose, proceed to ED or call 911). 25 tablet 3   Omega-3 Fatty Acids (OMEGA-3 FISH OIL PO) Take 1,000 mg by mouth in the morning.     omeprazole (PRILOSEC) 40 MG capsule Take 1 capsule (40 mg total) by mouth in the morning.     ondansetron (ZOFRAN) 8 MG tablet Take 1 tablet (8 mg total) by mouth every 8 (eight) hours as needed for vomiting or nausea. 20 tablet 2   Oxycodone HCl 20 MG TABS Take 1 tablet (20 mg total) by mouth 4 (four) times daily as needed. For severe pain 30 tablet 0   Polyethyl Glycol-Propyl Glycol (LUBRICANT EYE DROPS) 0.4-0.3 % SOLN Place 1-2 drops into both eyes 3 (three) times daily as needed (dry/irritated eyes.).     prochlorperazine (COMPAZINE) 10 MG tablet Take 1 tablet (10 mg total) by mouth every 6 (six) hours as needed for nausea or vomiting. 20 tablet 0   rosuvastatin (CRESTOR) 20 MG tablet TAKE HALF A TABLET (10 MG) BY MOUTH ONCE DAILY. 45 tablet 3   sucralfate (CARAFATE) 1 g tablet Take 1 tablet (1 g total) by mouth 4 (four) times daily -  with meals and at bedtime. Crush and dissolve in 10 mL warm water prior  to swallowing, take 20 min before meals 120 tablet 1   tadalafil (CIALIS) 5 MG tablet Take 5 mg by mouth daily as needed for erectile dysfunction.     tamsulosin (FLOMAX) 0.4 MG CAPS capsule Take 0.4 mg by mouth in the morning.     Testosterone 20.25 MG/ACT (1.62%) GEL Apply 2 Pump topically in the morning.     vitamin C (ASCORBIC ACID) 500 MG tablet Take 500 mg by mouth in the morning.     No current facility-administered medications for this encounter.    Physical Findings: The patient is in no acute distress. Patient is alert and oriented.  vitals were not taken for this visit. .  No significant changes. Lungs are clear to auscultation bilaterally. Heart has regular rate and rhythm. No palpable cervical, supraclavicular, or axillary adenopathy. Abdomen soft, non-tender, normal bowel sounds.  Lab Findings: Lab Results  Component Value Date   WBC 4.5 05/28/2022   HGB 10.1 (L) 05/28/2022   HCT 29.9 (L) 05/28/2022   MCV 98.0 05/28/2022   PLT 121 (L) 05/28/2022    Radiographic Findings: CT Chest W Contrast  Result Date: 05/20/2022 CLINICAL DATA:  Non-small cell lung cancer. Assess treatment response. Previous surgery. Chemotherapy and radiation therapy completed. * Tracking Code: BO *. EXAM: CT CHEST WITH CONTRAST TECHNIQUE: Multidetector CT imaging of the chest was performed during intravenous contrast administration. RADIATION DOSE REDUCTION: This exam was performed according to the departmental dose-optimization program which includes automated exposure control, adjustment of the mA and/or kV according to patient size and/or use of iterative reconstruction technique. CONTRAST:  75mL OMNIPAQUE IOHEXOL 300 MG/ML  SOLN COMPARISON:  PET-CT 07/23/2021.  Chest CT 11/06/2021. FINDINGS: Cardiovascular: Atherosclerosis of the aorta, great vessels and coronary arteries status post median sternotomy and CABG. No acute vascular findings are demonstrated. Right IJ Port-A-Cath extends to the lower SVC  level. The heart size is normal. There is no pericardial effusion. Mediastinum/Nodes: Interval increased soft tissue thickening around the trachea and right proximal bronchi without focal enlarged mediastinal, hilar or axillary lymph nodes. The thyroid gland and esophagus appear unremarkable. Lungs/Pleura: No pleural effusion or pneumothorax. Interval right upper lobectomy. Underlying advanced centrilobular and paraseptal emphysema with stable partially calcified subpleural nodularity posteriorly in the right lower lobe. More inferiorly in the right lower lobe, there are new ill-defined ground-glass opacities which are likely infectious or treatment related. No suspicious pulmonary nodules. Upper abdomen: The visualized upper abdomen appears stable, without significant findings. Musculoskeletal/Chest wall: There is no chest wall mass or suspicious osseous finding. Intact median sternotomy. Mild spondylosis. IMPRESSION: 1. Interval right upper lobectomy. 2. Interval increased soft tissue thickening around the trachea and right proximal bronchi, likely treatment related. No focal enlarged lymph nodes or suspicious pulmonary nodules. 3. New ill-defined ground-glass opacities in the right lower lobe, likely infectious or treatment related. 4. Aortic Atherosclerosis (ICD10-I70.0) and Emphysema (ICD10-J43.9). Electronically Signed   By: Carey Bullocks M.D.   On: 05/20/2022 15:07    Impression:  The primary encounter diagnosis was Malignant neoplasm of right upper lobe of lung (HCC). A diagnosis of Non-small cell cancer of right lung [facility]) was also pertinent to this visit.   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  ***  Plan:  ***   *** minutes of total time was spent for this patient encounter, including preparation, face-to-face counseling with the patient and coordination of care, physical exam, and documentation of the encounter. ____________________________________  Billie Lade, PhD, MD  This document serves as a record of services personally performed by Antony Blackbird, MD. It was created on his behalf by Neena Rhymes, a trained medical scribe. The creation of this record is based on the scribe's personal observations and the provider's statements to them. This document has been checked and approved by the attending provider.

## 2022-05-30 ENCOUNTER — Encounter: Payer: Self-pay | Admitting: Radiation Oncology

## 2022-05-30 ENCOUNTER — Ambulatory Visit
Admission: RE | Admit: 2022-05-30 | Discharge: 2022-05-30 | Disposition: A | Discharge status: Home / Self Care | Payer: Medicare Other | Source: Ambulatory Visit | Attending: Radiation Oncology | Admitting: Radiation Oncology

## 2022-05-30 VITALS — BP 112/64 | HR 80 | Temp 98.7°F | Resp 18 | Ht 72.0 in | Wt 175.6 lb

## 2022-05-30 DIAGNOSIS — C3411 Malignant neoplasm of upper lobe, right bronchus or lung: Secondary | ICD-10-CM | POA: Insufficient documentation

## 2022-05-30 DIAGNOSIS — Z923 Personal history of irradiation: Secondary | ICD-10-CM | POA: Insufficient documentation

## 2022-05-30 DIAGNOSIS — R110 Nausea: Secondary | ICD-10-CM | POA: Diagnosis not present

## 2022-05-30 DIAGNOSIS — C3491 Malignant neoplasm of unspecified part of right bronchus or lung: Secondary | ICD-10-CM

## 2022-05-30 HISTORY — DX: Personal history of irradiation: Z92.3

## 2022-05-30 LAB — T4: T4, Total: 6.3 ug/dL (ref 4.5–12.0)

## 2022-05-30 MED ORDER — LIDOCAINE VISCOUS HCL 2 % MT SOLN
15.0000 mL | OROMUCOSAL | 2 refills | Status: DC | PRN
Start: 2022-05-30 — End: 2022-06-24

## 2022-05-30 MED ORDER — OXYCODONE HCL 20 MG PO TABS
20.0000 mg | ORAL_TABLET | Freq: Four times a day (QID) | ORAL | 0 refills | Status: DC | PRN
Start: 2022-05-30 — End: 2022-06-24

## 2022-05-30 MED ORDER — SUCRALFATE 1 G PO TABS
1.0000 g | ORAL_TABLET | Freq: Three times a day (TID) | ORAL | 1 refills | Status: DC
Start: 2022-05-30 — End: 2022-07-24

## 2022-05-30 MED ORDER — ONDANSETRON HCL 8 MG PO TABS
8.0000 mg | ORAL_TABLET | Freq: Three times a day (TID) | ORAL | 2 refills | Status: DC | PRN
Start: 2022-05-30 — End: 2022-07-04

## 2022-05-30 NOTE — Progress Notes (Signed)
Patrick Harris is here today for follow up post radiation to the lung.  Lung Side: Right, patient completed treatment on 05/06/22.   Does the patient complain of any of the following: Pain: Continues to have pain to right  chest and throat.Patient continues to take Oxycodone, lidocaine and carfate for pain relief.  Shortness of breath w/wo exertion: No Cough: Yes, productive Hemoptysis: No Pain with swallowing: Yes  Swallowing/choking concerns: No Appetite: Poor Weight: Wt Readings from Last 3 Encounters:  05/30/22 175 lb 9.6 oz (79.7 kg)  05/28/22 176 lb 8 oz (80.1 kg)  05/22/22 174 lb 1.6 oz (79 kg)    Energy Level: low Post radiation skin Changes: No    Additional comments if applicable:   BP 112/64   Pulse 80   Temp 98.7 F (37.1 C)   Resp 18   Ht 6' (1.829 m)   Wt 175 lb 9.6 oz (79.7 kg)   SpO2 96%   BMI 23.82 kg/m

## 2022-06-02 ENCOUNTER — Emergency Department (HOSPITAL_COMMUNITY): Payer: Medicare Other

## 2022-06-02 ENCOUNTER — Other Ambulatory Visit: Payer: Self-pay

## 2022-06-02 ENCOUNTER — Encounter (HOSPITAL_COMMUNITY): Payer: Self-pay | Admitting: Emergency Medicine

## 2022-06-02 ENCOUNTER — Emergency Department (HOSPITAL_COMMUNITY)
Admission: EM | Admit: 2022-06-02 | Discharge: 2022-06-02 | Disposition: A | Discharge status: Home / Self Care | Payer: Medicare Other | Attending: Emergency Medicine | Admitting: Emergency Medicine

## 2022-06-02 DIAGNOSIS — Z1152 Encounter for screening for COVID-19: Secondary | ICD-10-CM | POA: Insufficient documentation

## 2022-06-02 DIAGNOSIS — J9811 Atelectasis: Secondary | ICD-10-CM | POA: Diagnosis not present

## 2022-06-02 DIAGNOSIS — R509 Fever, unspecified: Secondary | ICD-10-CM | POA: Diagnosis not present

## 2022-06-02 LAB — URINALYSIS, W/ REFLEX TO CULTURE (INFECTION SUSPECTED)
Bacteria, UA: NONE SEEN
Bilirubin Urine: NEGATIVE
Glucose, UA: NEGATIVE mg/dL
Hgb urine dipstick: NEGATIVE
Ketones, ur: NEGATIVE mg/dL
Leukocytes,Ua: NEGATIVE
Nitrite: NEGATIVE
Protein, ur: NEGATIVE mg/dL
Specific Gravity, Urine: 1.009 (ref 1.005–1.030)
pH: 6 (ref 5.0–8.0)

## 2022-06-02 LAB — RESP PANEL BY RT-PCR (RSV, FLU A&B, COVID)  RVPGX2
Influenza A by PCR: NEGATIVE
Influenza B by PCR: NEGATIVE
Resp Syncytial Virus by PCR: NEGATIVE
SARS Coronavirus 2 by RT PCR: NEGATIVE

## 2022-06-02 LAB — COMPREHENSIVE METABOLIC PANEL
ALT: 22 U/L (ref 0–44)
AST: 19 U/L (ref 15–41)
Albumin: 2.9 g/dL — ABNORMAL LOW (ref 3.5–5.0)
Alkaline Phosphatase: 52 U/L (ref 38–126)
Anion gap: 13 (ref 5–15)
BUN: 17 mg/dL (ref 8–23)
CO2: 21 mmol/L — ABNORMAL LOW (ref 22–32)
Calcium: 8.6 mg/dL — ABNORMAL LOW (ref 8.9–10.3)
Chloride: 96 mmol/L — ABNORMAL LOW (ref 98–111)
Creatinine, Ser: 1.14 mg/dL (ref 0.61–1.24)
GFR, Estimated: >60 mL/min (ref >60)
Glucose, Bld: 130 mg/dL — ABNORMAL HIGH (ref 70–99)
Potassium: 4.1 mmol/L (ref 3.5–5.1)
Sodium: 130 mmol/L — ABNORMAL LOW (ref 135–145)
Total Bilirubin: 0.5 mg/dL (ref 0.3–1.2)
Total Protein: 6.9 g/dL (ref 6.5–8.1)

## 2022-06-02 LAB — LACTIC ACID, PLASMA: Lactic Acid, Venous: 1.1 mmol/L (ref 0.5–1.9)

## 2022-06-02 LAB — CBC WITH DIFFERENTIAL/PLATELET
Abs Immature Granulocytes: 0.01 10*3/uL (ref 0.00–0.07)
Basophils Absolute: 0 10*3/uL (ref 0.0–0.1)
Basophils Relative: 1 %
Eosinophils Absolute: 0.3 10*3/uL (ref 0.0–0.5)
Eosinophils Relative: 8 %
HCT: 28.7 % — ABNORMAL LOW (ref 39.0–52.0)
Hemoglobin: 9.6 g/dL — ABNORMAL LOW (ref 13.0–17.0)
Immature Granulocytes: 0 %
Lymphocytes Relative: 15 %
Lymphs Abs: 0.5 10*3/uL — ABNORMAL LOW (ref 0.7–4.0)
MCH: 32.8 pg (ref 26.0–34.0)
MCHC: 33.4 g/dL (ref 30.0–36.0)
MCV: 98 fL (ref 80.0–100.0)
Monocytes Absolute: 0.6 10*3/uL (ref 0.1–1.0)
Monocytes Relative: 18 %
Neutro Abs: 2 10*3/uL (ref 1.7–7.7)
Neutrophils Relative %: 58 %
Platelets: 114 10*3/uL — ABNORMAL LOW (ref 150–400)
RBC: 2.93 MIL/uL — ABNORMAL LOW (ref 4.22–5.81)
RDW: 16.3 % — ABNORMAL HIGH (ref 11.5–15.5)
WBC: 3.4 10*3/uL — ABNORMAL LOW (ref 4.0–10.5)
nRBC: 0 % (ref 0.0–0.2)

## 2022-06-02 LAB — GROUP A STREP BY PCR: Group A Strep by PCR: NOT DETECTED

## 2022-06-02 NOTE — ED Triage Notes (Signed)
Pt via POV c/o fever max temp 101 at home with n/v, fatigue, chills, and sweats. Pt took tylenol arthritis at home an hour PTA. Pt has hx lung cancer with recent radiation therapy at [facility] 3 weeks ago. Pt denies pain in triage. A/O and ambulatory

## 2022-06-02 NOTE — Discharge Instructions (Addendum)
You may take Tylenol if you have fever.  Return to the emergency department if persistent fever or worsening of symptoms.

## 2022-06-02 NOTE — ED Provider Notes (Signed)
[place] EMERGENCY DEPARTMENT AT [facility] Provider Note   CSN: [id number] Arrival date & time: 06/02/22  2017     History  Chief Complaint  Patient presents with   Fever    Patrick Harris is a 77 y.o. male.  Patient reports that he took his temperature at home tonight and his fever was 101.  Patient reports taking a arthritis Tylenol and temperature return to normal.  Patient reports that he is currently being treated for non-small cell lung cancer.  Patient denies any other symptoms he has not had any cough he has not had any fever  The history is provided by the patient. No language interpreter was used.  Fever Max temp prior to arrival:  101 Temp source:  Subjective Severity:  Moderate Timing:  Constant Progression:  Worsening Chronicity:  New Relieved by:  Nothing Worsened by:  Nothing Ineffective treatments:  None tried Associated symptoms: chills and sore throat   Associated symptoms: no chest pain, no cough, no diarrhea and no nausea   Risk factors: no contaminated food, no recent sickness, no recent travel and no sick contacts        Home Medications Prior to Admission medications   Medication Sig Start Date End Date Taking? Authorizing Provider  aspirin EC 81 MG tablet Take 1 tablet (81 mg total) by mouth at bedtime. Okay to restart this medication on 07/03/2021 07/02/21   Leslye Peer, MD  Cholecalciferol (VITAMIN D3) 2000 UNITS TABS Take 2,000 Units by mouth in the morning and at bedtime.    [provider]  Coenzyme Q10 (COQ-10) 100 MG CAPS Take 100 mg by mouth in the morning.    [provider]  Cyanocobalamin (B-12 PO) Take 1 tablet by mouth daily.    [provider]  dexamethasone (DECADRON) 4 MG tablet Take 1 tablet (4 mg total) by mouth daily. 05/16/22   Zadie Rhine, MD  fluconazole (DIFLUCAN) 100 MG tablet Take 1 tablet (100 mg total) by mouth daily. 04/18/22   Antony Blackbird, MD  folic acid (FOLVITE) 1 MG  tablet TAKE 1 TABLET BY MOUTH EVERY DAY 04/05/22   Si Gaul, MD  hydrALAZINE (APRESOLINE) 25 MG tablet Take 1 tablet (25 mg total) by mouth 3 (three) times daily. 08/29/21   [name], MD  irbesartan (AVAPRO) 300 MG tablet Take 1 tablet (300 mg total) by mouth daily. 08/29/21   [name], MD  lidocaine (XYLOCAINE) 2 % solution Use as directed 15 mLs in the mouth or throat as needed for mouth pain. Swallow 20 min prior to meals and at bedtime 05/30/22   Erven Colla, PA-C  LORazepam (ATIVAN) 0.5 MG tablet Take 1 tablet (0.5 mg total) by mouth every 8 (eight) hours as needed for anxiety. Patient not taking: Reported on 05/30/2022 05/16/22   Zadie Rhine, MD  nitroGLYCERIN (NITROSTAT) 0.4 MG SL tablet Place 1 tablet (0.4 mg total) under the tongue every 5 (five) minutes x 3 doses as needed for chest pain (if no relief after 2nd dose, proceed to ED or call 911). 02/04/22   Jonelle Sidle, MD  Omega-3 Fatty Acids (OMEGA-3 FISH OIL PO) Take 1,000 mg by mouth in the morning.    [provider]  omeprazole (PRILOSEC) 40 MG capsule Take 1 capsule (40 mg total) by mouth in the morning. 01/10/22   [name], MD  ondansetron (ZOFRAN) 8 MG tablet Take 1 tablet (8 mg total) by mouth every 8 (  eight) hours as needed for vomiting or nausea. 05/30/22   Erven Colla, PA-C  Oxycodone HCl 20 MG TABS Take 1 tablet (20 mg total) by mouth 4 (four) times daily as needed. For severe pain 05/30/22   Erven Colla, PA-C  Polyethyl Glycol-Propyl Glycol (LUBRICANT EYE DROPS) 0.4-0.3 % SOLN Place 1-2 drops into both eyes 3 (three) times daily as needed (dry/irritated eyes.).    [provider]  prochlorperazine (COMPAZINE) 10 MG tablet Take 1 tablet (10 mg total) by mouth every 6 (six) hours as needed for nausea or vomiting. Patient not taking: Reported on 05/30/2022 05/16/22   Zadie Rhine, MD  rosuvastatin (CRESTOR) 20 MG tablet TAKE HALF A TABLET (10 MG) BY MOUTH ONCE DAILY. 10/08/21    [name], MD  sucralfate (CARAFATE) 1 g tablet Take 1 tablet (1 g total) by mouth 4 (four) times daily -  with meals and at bedtime. Crush and dissolve in 10 mL warm water prior to swallowing, take 20 min before meals 05/30/22   Erven Colla, PA-C  tadalafil (CIALIS) 5 MG tablet Take 5 mg by mouth daily as needed for erectile dysfunction. Patient not taking: Reported on 05/30/2022    [provider]  tamsulosin (FLOMAX) 0.4 MG CAPS capsule Take 0.4 mg by mouth in the morning.    [provider]  Testosterone 20.25 MG/ACT (1.62%) GEL Apply 2 Pump topically in the morning. 07/27/19   [provider]  vitamin C (ASCORBIC ACID) 500 MG tablet Take 500 mg by mouth in the morning.    [provider]      Allergies    Latex, Bromocriptine, Zestril [lisinopril], Diovan [valsartan], Hydrochlorothiazide, Norvasc [amlodipine], and Zocor [simvastatin]    Review of Systems   Review of Systems  Constitutional:  Positive for chills and fever.  HENT:  Positive for sore throat.   Respiratory:  Negative for cough.   Cardiovascular:  Negative for chest pain.  Gastrointestinal:  Negative for diarrhea and nausea.  All other systems reviewed and are negative.   Physical Exam Updated Vital Signs BP 125/64   Pulse 73   Temp 98.3 F (36.8 C) (Oral)   Resp 20   Ht 6' (1.829 m)   Wt 79.4 kg   SpO2 100%   BMI 23.73 kg/m  Physical Exam Vitals and nursing note reviewed.  Constitutional:      Appearance: He is well-developed.  HENT:     Head: Normocephalic.     Right Ear: Tympanic membrane normal.     Left Ear: Tympanic membrane normal.     Mouth/Throat:     Mouth: Mucous membranes are moist.  Eyes:     Extraocular Movements: Extraocular movements intact.     Pupils: Pupils are equal, round, and reactive to light.  Cardiovascular:     Rate and Rhythm: Normal rate.  Pulmonary:     Effort: Pulmonary effort is normal.  Abdominal:     General: Abdomen is flat.  There is no distension.  Musculoskeletal:        General: Normal range of motion.     Cervical back: Normal range of motion.  Skin:    General: Skin is warm.  Neurological:     General: No focal deficit present.     Mental Status: He is alert and oriented to person, place, and time.  Psychiatric:        Mood and Affect: Mood normal.        Behavior: Behavior  normal.     ED Results / Procedures / Treatments   Labs (all labs ordered are listed, but only abnormal results are displayed) Labs Reviewed  COMPREHENSIVE METABOLIC PANEL - Abnormal; Notable for the following components:      Result Value   Sodium 130 (*)    Chloride 96 (*)    CO2 21 (*)    Glucose, Bld 130 (*)    Calcium 8.6 (*)    Albumin 2.9 (*)    All other components within normal limits  CBC WITH DIFFERENTIAL/PLATELET - Abnormal; Notable for the following components:   WBC 3.4 (*)    RBC 2.93 (*)    Hemoglobin 9.6 (*)    HCT 28.7 (*)    RDW 16.3 (*)    Platelets 114 (*)    Lymphs Abs 0.5 (*)    All other components within normal limits  GROUP A STREP BY PCR  RESP PANEL BY RT-PCR (RSV, FLU A&B, COVID)  [id number]  CULTURE, BLOOD (ROUTINE X 2)  CULTURE, BLOOD (ROUTINE X 2)  LACTIC ACID, PLASMA  URINALYSIS, W/ REFLEX TO CULTURE (INFECTION SUSPECTED)  LACTIC ACID, PLASMA    EKG None  Radiology DG Chest 2 View  Result Date: 06/02/2022 CLINICAL DATA:  Suspected sepsis. EXAM: CHEST - 2 VIEW COMPARISON:  02/20/2022. FINDINGS: The heart size and mediastinal contours are within normal limits. There is atherosclerotic calcification of the aorta. Reduced lung volume is noted on the right with mild atelectasis or scarring at the right lung base. The left lung is clear. No effusion or pneumothorax. Degenerative changes are present in the thoracic spine. Sternotomy wires are noted. A right chest port terminates over the superior vena cava. IMPRESSION: Stable chest with no acute process. Electronically Signed   By: Thornell Sartorius M.D.   On: 06/02/2022 21:22    Procedures Procedures    Medications Ordered in ED Medications - No data to display  ED Course/ Medical Decision Making/ A&P                             Medical Decision Making Patient reports that he had a temperature at home tonight.  Patient reports temperature resolved with a dosage of Tylenol  Amount and/or Complexity of Data Reviewed External Data Reviewed: notes.    Details: Oncology notes reviewed. Labs: ordered. Decision-making details documented in ED Course.    Details: Abs ordered reviewed and interpreted patient has a sodium of 130.  Glucose is 130.  Patient's lactic acid is normal at 1.1.  White blood cell count is 3.4 platelets are 114.  UA is negative Radiology: ordered and independent interpretation performed. Decision-making details documented in ED Course.    Details: Chest x-ray shows no evidence of pneumonia  Risk Risk Details: Patient looks well overall.  He has remained afebrile.  Patient is not tachycardic.  Patient is advised to continue to monitor his temperature Tylenol if needed patient is advised to recheck with his physician in 2 to 3 days.  He is advised to return to the emergency department if symptoms worsen or change.           Final Clinical Impression(s) / ED Diagnoses Final diagnoses:  Febrile illness    Rx / DC Orders ED Discharge Orders     None     An After Visit Summary was printed and given to the patient.     Elson Areas, [place] 06/02/22 [id number]  Derwood Kaplan, MD 06/07/22 [phone]

## 2022-06-04 ENCOUNTER — Telehealth: Payer: Self-pay | Admitting: Medical Oncology

## 2022-06-04 DIAGNOSIS — R112 Nausea with vomiting, unspecified: Secondary | ICD-10-CM

## 2022-06-04 DIAGNOSIS — R110 Nausea: Secondary | ICD-10-CM

## 2022-06-04 LAB — CULTURE, BLOOD (ROUTINE X 2): Special Requests: ADEQUATE

## 2022-06-04 MED ORDER — PROCHLORPERAZINE MALEATE 10 MG PO TABS
10.0000 mg | ORAL_TABLET | Freq: Four times a day (QID) | ORAL | 0 refills | Status: DC | PRN
Start: 2022-06-04 — End: 2022-07-05

## 2022-06-04 NOTE — Telephone Encounter (Signed)
Last Saturday -Fever 101.9 /chills -went to ED labs done. He is taking tylenol 650 twice today since sat . Gets hot and sweating and change shirts. Current temp is 99. Mowed lawn yesterday for 2 hours. [employer]) . Now he feels ok -just hot and nausea does not go away. Compazine reordered.

## 2022-06-05 LAB — CULTURE, BLOOD (ROUTINE X 2)

## 2022-06-06 ENCOUNTER — Encounter: Payer: Self-pay | Admitting: *Deleted

## 2022-06-06 ENCOUNTER — Ambulatory Visit: Payer: Self-pay

## 2022-06-06 ENCOUNTER — Telehealth: Payer: Self-pay | Admitting: *Deleted

## 2022-06-06 ENCOUNTER — Ambulatory Visit (HOSPITAL_COMMUNITY)
Admission: EM | Admit: 2022-06-06 | Discharge: 2022-06-06 | Disposition: A | Discharge status: Home / Self Care | Payer: Medicare Other | Attending: Internal Medicine | Admitting: Internal Medicine

## 2022-06-06 ENCOUNTER — Encounter (HOSPITAL_COMMUNITY): Payer: Self-pay

## 2022-06-06 DIAGNOSIS — R509 Fever, unspecified: Secondary | ICD-10-CM

## 2022-06-06 LAB — CULTURE, BLOOD (ROUTINE X 2): Special Requests: ADEQUATE

## 2022-06-06 NOTE — Chronic Care Management (AMB) (Signed)
   06/06/2022  Patrick Harris December 21, 1945 [id number]   Reason for Encounter: Patient is not currently enrolled in the CCM program. CCM status changed to previously enrolled  Alto Denver RN, MSN, CCM RN Care Manager  Chronic Care Management Direct Number: [phone]

## 2022-06-06 NOTE — ED Triage Notes (Signed)
Fever started Monday. Went to ER. (Look at chart) [facility]. Temp today was 101.4 today. Took tylenol 2 hours prior to taking the temp. @ 2 pm today. Fatigue. Wife states that pt had immunotherapy a week ago.

## 2022-06-06 NOTE — Discharge Instructions (Signed)
Take Tylenol as needed for fever Please follow-up with your oncologist Please maintain adequate hydration If you have worsening symptoms please return to urgent care or go to the emergency department to be evaluated.

## 2022-06-06 NOTE — Transitions of Care (Post Inpatient/ED Visit) (Signed)
06/06/2022  Name: Patrick Harris MRN: [id number] DOB: [birth year]  Today's TOC FU Call Status: Today's TOC FU Call Status:: Successful TOC FU Call Competed TOC FU Call Complete Date: 06/06/22  ED EMMI Red Alert notification on 06/06/22 from ED visit 06/02/22- EMMI call placed 06/04/22: "No scheduled follow up"  Transition Care Management Follow-up Telephone Call Date of Discharge: 06/02/22 Dis[facility] Type of Discharge: Emergency Department Reason for ED Visit: Other: (fever in setting of lung CA) How have you been since you were released from the hospital?: Better ("I am better after the ER visit-- don't think I need to see Dr. Lawerance Bach at this point.  I called my cancer doctor and they told me fever can be associated with immunotherapy that they are giving me.  I am not running a fever right now") Any questions or concerns?: No  Items Reviewed: Did you receive and understand the discharge instructions provided?: No (thoroughly reviewed with patient who verbalizes good understanding of same) Medications obtained,verified, and reconciled?: No Medications Not Reviewed Reasons:: Other: (patient declined medication review- reports manages medications independently; not currently near meds; denies questions/ concerns around medications today) Any new allergies since your discharge?: No Dietary orders reviewed?: Yes Type of Diet Ordered:: "Regular" Do you have support at home?: Yes People in Home: spouse Name of Support/Comfort Primary Source: Reports independent in self-care activities; supportive spouse assists as/ if needed/ indicated  Medications Reviewed Today: Medications Reviewed Today     Reviewed by Michaela Corner, RN (Registered Nurse) on 06/06/22 at 1459  Med List Status: <None>   Medication Order Taking? Sig Documenting Provider Last Dose Status Informant  aspirin EC 81 MG tablet [id number] No Take 1 tablet (81 mg total) by mouth at bedtime. Okay to  restart this medication on 07/03/2021 Leslye Peer, MD Taking Active Self  Cholecalciferol (VITAMIN D3) 2000 UNITS TABS [id number] No Take 2,000 Units by mouth in the morning and at bedtime. [provider] Taking Active Self  Coenzyme Q10 (COQ-10) 100 MG CAPS [id number] No Take 100 mg by mouth in the morning. [provider] Taking Active Self  Cyanocobalamin (B-12 PO) [id number] No Take 1 tablet by mouth daily. [provider] Taking Active Self  dexamethasone (DECADRON) 4 MG tablet [id number] No Take 1 tablet (4 mg total) by mouth daily. Zadie Rhine, MD Taking Active   fluconazole (DIFLUCAN) 100 MG tablet [id number] No Take 1 tablet (100 mg total) by mouth daily. Antony Blackbird, MD Taking Active Self  folic acid (FOLVITE) 1 MG tablet [id number] No TAKE 1 TABLET BY MOUTH EVERY DAY Si Gaul, MD Taking Active Self  hydrALAZINE (APRESOLINE) 25 MG tablet [id number] No Take 1 tablet (25 mg total) by mouth 3 (three) times daily. [name], MD Taking Active Self  irbesartan (AVAPRO) 300 MG tablet [id number] No Take 1 tablet (300 mg total) by mouth daily. [name], MD Taking Active Self  lidocaine (XYLOCAINE) 2 % solution [id number]  Use as directed 15 mLs in the mouth or throat as needed for mouth pain. Swallow 20 min prior to meals and at bedtime Erven Colla, PA-C  Active   LORazepam (ATIVAN) 0.5 MG tablet [id number] No Take 1 tablet (0.5 mg total) by mouth every 8 (eight) hours as needed for anxiety.  Patient not taking: Reported on 05/30/2022   Zadie Rhine, MD Not Taking Active   nitroGLYCERIN (NITROSTAT) 0.4 MG SL tablet [id number] No Place 1 tablet (0.4 mg total) under the  tongue every 5 (five) minutes x 3 doses as needed for chest pain (if no relief after 2nd dose, proceed to ED or call 911). Jonelle Sidle, MD Taking Active Self  Omega-3 Fatty Acids (OMEGA-3 FISH OIL PO) [id number] No Take 1,000 mg by mouth in the morning. [provider]  Taking Active Self  omeprazole (PRILOSEC) 40 MG capsule [id number] No Take 1 capsule (40 mg total) by mouth in the morning. [name], MD Taking Active Self  ondansetron (ZOFRAN) 8 MG tablet [id number]  Take 1 tablet (8 mg total) by mouth every 8 (eight) hours as needed for vomiting or nausea. Erven Colla, PA-C  Active   Oxycodone HCl 20 MG TABS [id number]  Take 1 tablet (20 mg total) by mouth 4 (four) times daily as needed. For severe pain Erven Colla, PA-C  Active   Polyethyl Glycol-Propyl Glycol (LUBRICANT EYE DROPS) 0.4-0.3 % SOLN [id number] No Place 1-2 drops into both eyes 3 (three) times daily as needed (dry/irritated eyes.). [provider] Taking Active Self  prochlorperazine (COMPAZINE) 10 MG tablet [id number]  Take 1 tablet (10 mg total) by mouth every 6 (six) hours as needed for nausea or vomiting. Si Gaul, MD  Active   rosuvastatin (CRESTOR) 20 MG tablet [id number] No TAKE HALF A TABLET (10 MG) BY MOUTH ONCE DAILY. [name], MD Taking Active Self  sucralfate (CARAFATE) 1 g tablet [id number]  Take 1 tablet (1 g total) by mouth 4 (four) times daily -  with meals and at bedtime. Crush and dissolve in 10 mL warm water prior to swallowing, take 20 min before meals Bryan Lemma M, PA-C  Active   tadalafil (CIALIS) 5 MG tablet [id number] No Take 5 mg by mouth daily as needed for erectile dysfunction.  Patient not taking: Reported on 05/30/2022   [provider] Not Taking Active Self  tamsulosin (FLOMAX) 0.4 MG CAPS capsule [id number] No Take 0.4 mg by mouth in the morning. [provider] Taking Active Self  Testosterone 20.25 MG/ACT (1.62%) GEL [id number] No Apply 2 Pump topically in the morning. [provider] Taking Active Self  vitamin C (ASCORBIC ACID) 500 MG tablet [id number] No Take 500 mg by mouth in the morning. [provider] 05/15/2022 Active Self           Home Care and Equipment/Supplies: Were Home Health Services  Ordered?: No Any new equipment or medical supplies ordered?: No  Functional Questionnaire: Do you need assistance with bathing/showering or dressing?: No Do you need assistance with meal preparation?: No Do you need assistance with eating?: No Do you have difficulty maintaining continence: No Do you need assistance with getting out of bed/getting out of a chair/moving?: No Do you have difficulty managing or taking your medications?: No  Follow up appointments reviewed: PCP Follow-up appointment confirmed?: NA Specialist Hospital Follow-up appointment confirmed?: Yes Date of Specialist follow-up appointment?: 06/26/22 Follow-Up Specialty Provider:: oncology provider Do you need transportation to your follow-up appointment?: No Do you understand care options if your condition(s) worsen?: Yes-patient verbalized understanding  SDOH Interventions Today    Flowsheet Row Most Recent Value  SDOH Interventions   Food Insecurity Interventions Intervention Not Indicated  Transportation Interventions Intervention Not Indicated  [normally drives self,  spouse assists as/ if needed]      TOC Interventions Today    Flowsheet Row Most Recent Value  TOC Interventions   TOC Interventions Discussed/Reviewed TOC Interventions Discussed  [Patient declines need for ongoing/ further care coordination  outreach,  no care coordination needs identified at time of TOC call today,  provided my direct contact information should questions/ concerns/ needs arise post-TOC call]      Interventions Today    Flowsheet Row Most Recent Value  Chronic Disease   Chronic disease during today's visit Other  [fever in setting of CA]  General Interventions   General Interventions Discussed/Reviewed General Interventions Discussed, Durable Medical Equipment (DME), Doctor Visits  Doctor Visits Discussed/Reviewed Specialist, PCP, Doctor Visits Discussed  Durable Medical Equipment (DME) Other  [reports does not  currently require assisitve devices]  PCP/Specialist Visits Compliance with follow-up visit  Nutrition Interventions   Nutrition Discussed/Reviewed Nutrition Discussed  Pharmacy Interventions   Pharmacy Dicussed/Reviewed Pharmacy Topics Discussed      Caryl Pina, RN, BSN, CCRN Alumnus RN CM Care Coordination/ Transition of Care- [facility] Care Management [phone]: direct office

## 2022-06-07 LAB — CULTURE, BLOOD (ROUTINE X 2)
Culture: NO GROWTH
Culture: NO GROWTH

## 2022-06-07 NOTE — ED Provider Notes (Signed)
[facility]    CSN: [id number] Arrival date & time: 06/06/22  1851      History   Chief Complaint Chief Complaint  Patient presents with   Fever   Fatigue    HPI Patrick Harris is a 77 y.o. male with a history of stage IIIa non-small cell lung cancer status post chemoradiation and recently started on durvalumab comes to the urgent care with complaints of recurrent fever of 101 Fahrenheit.  Patient endorses recurrent daily fevers over the past week approximately 1 week after he received durvalumab.  No shortness of breath or wheezing.  No cough or sputum production.  No sore throat or generalized bodyaches.  Patient denies any night sweats or oral ulcers.  No dysuria urgency or frequency.  Patient was seen in the emergency department 5 days ago for the same complaint.  His workup in the ED including CBC, urinalysis, respiratory panel, strep test, CMP and blood cultures as well as chest x-ray were all unremarkable.  Patient was discharged from the emergency department.   HPI  Past Medical History:  Diagnosis Date   Arthritis    CAD (coronary artery disease)    CABG with LIMA to LAD and RIMA to RCA August 2000 - Dr. Dorris Fetch   Cancer [facility])    basal cell   CHF (congestive heart failure) (HCC)    COPD (chronic obstructive pulmonary disease) (HCC)    Elevated glycated hemoglobin    Essential hypertension    GERD (gastroesophageal reflux disease)    Heart murmur    History of radiation therapy    Right lung(chest) 03/19/22-05/06/22- Dr. Antony Blackbird   Hyperlipidemia    Hypopituitarism (HCC)    Lung cancer (HCC)    Osteopenia    Dr Juleen China   Pancreatitis    Peyronie disease    Testosterone deficiency    Dr Juleen China    Patient Active Problem List   Diagnosis Date Noted   Port-A-Cath in place 05/28/2022   Nocturia 03/27/2022   Globus sensation 01/10/2022   Dysfunction of left eustachian tube 01/10/2022   Non-small cell cancer of right lung (HCC) 08/23/2021    Pulmonary nodules 06/19/2021   PAD (peripheral artery disease) (HCC) 04/20/2021   Nausea 04/20/2021   Encounter for antineoplastic chemotherapy 03/30/2021   Aortic atherosclerosis (HCC) 08/25/2019   Inguinal hernia 08/25/2019   Chronic periumbilical pain 08/25/2019   Claudication (HCC) 08/24/2018   Neck pain 03/02/2018   Neuralgia 03/02/2018   Prediabetes 08/22/2017   Umbilical hernia 08/22/2017   Varicose veins of both lower extremities 05/22/2016   Vitamin D deficiency 08/21/2015   Hypertension 01/24/2015   Tingling in extremities 01/24/2015   Family history of prostate cancer 08/19/2014   Osteopenia 08/07/2009   Hyperlipidemia 08/12/2007   PITUITARY INSUFFICIENCY 08/05/2007   Testosterone deficiency 08/05/2007   Coronary atherosclerosis 08/05/2007   GERD 08/05/2007   BPH without obstruction/lower urinary tract symptoms 08/05/2007    Past Surgical History:  Procedure Laterality Date   Bone spur  2006   5th toe bilaterally   BRONCHIAL BIOPSY  07/02/2021   Procedure: BRONCHIAL BIOPSIES;  Surgeon: Leslye Peer, MD;  Location: [facility] ENDOSCOPY;  Service: Pulmonary;;   BRONCHIAL BRUSHINGS  07/02/2021   Procedure: BRONCHIAL BRUSHINGS;  Surgeon: Leslye Peer, MD;  Location: [facility] ENDOSCOPY;  Service: Pulmonary;;   BRONCHIAL NEEDLE ASPIRATION BIOPSY  07/02/2021   Procedure: BRONCHIAL NEEDLE ASPIRATION BIOPSIES;  Surgeon: Leslye Peer, MD;  Location: [facility] ENDOSCOPY;  Service: Pulmonary;;  COLONOSCOPY  2007   negative; Dr Jarold Motto   CORONARY ARTERY BYPASS GRAFT  2001   2 vessels   FIDUCIAL MARKER PLACEMENT  07/02/2021   Procedure: FIDUCIAL MARKER PLACEMENT;  Surgeon: Leslye Peer, MD;  Location: [facility] ENDOSCOPY;  Service: Pulmonary;;   fracture heel  1967   Bil/ due to fall off ladder   Fractured calcaneus  1969   bilaterally w/ ankle fusion   HERNIA REPAIR  1984   right inguinal hernia   KNEE SURGERY Left    LOBECTOMY Right 11/13/2021   MENISECTOMY  2007   R medial, Dr.  Charmayne Sheer CATH INSERTION     SHOULDER SURGERY  1994   left   UPPER GASTROINTESTINAL ENDOSCOPY  2007   GERD, Dr Jarold Motto   VASECTOMY     VIDEO BRONCHOSCOPY WITH RADIAL ENDOBRONCHIAL ULTRASOUND  07/02/2021   Procedure: VIDEO BRONCHOSCOPY WITH RADIAL ENDOBRONCHIAL ULTRASOUND;  Surgeon: Leslye Peer, MD;  Location: [facility] ENDOSCOPY;  Service: Pulmonary;;       Home Medications    Prior to Admission medications   Medication Sig Start Date End Date Taking? Authorizing Provider  aspirin EC 81 MG tablet Take 1 tablet (81 mg total) by mouth at bedtime. Okay to restart this medication on 07/03/2021 07/02/21  Yes Leslye Peer, MD  Cholecalciferol (VITAMIN D3) 2000 UNITS TABS Take 2,000 Units by mouth in the morning and at bedtime.   Yes [provider]  Coenzyme Q10 (COQ-10) 100 MG CAPS Take 100 mg by mouth in the morning.   Yes [provider]  Cyanocobalamin (B-12 PO) Take 1 tablet by mouth daily.   Yes [provider]  dexamethasone (DECADRON) 4 MG tablet Take 1 tablet (4 mg total) by mouth daily. 05/16/22  Yes Zadie Rhine, MD  fluconazole (DIFLUCAN) 100 MG tablet Take 1 tablet (100 mg total) by mouth daily. 04/18/22  Yes Antony Blackbird, MD  folic acid (FOLVITE) 1 MG tablet TAKE 1 TABLET BY MOUTH EVERY DAY 04/05/22  Yes Si Gaul, MD  hydrALAZINE (APRESOLINE) 25 MG tablet Take 1 tablet (25 mg total) by mouth 3 (three) times daily. 08/29/21  Yes Burns, Bobette Mo, MD  irbesartan (AVAPRO) 300 MG tablet Take 1 tablet (300 mg total) by mouth daily. 08/29/21  Yes Burns, Bobette Mo, MD  lidocaine (XYLOCAINE) 2 % solution Use as directed 15 mLs in the mouth or throat as needed for mouth pain. Swallow 20 min prior to meals and at bedtime 05/30/22  Yes Erven Colla, PA-C  LORazepam (ATIVAN) 0.5 MG tablet Take 1 tablet (0.5 mg total) by mouth every 8 (eight) hours as needed for anxiety. 05/16/22  Yes Zadie Rhine, MD  nitroGLYCERIN (NITROSTAT) 0.4 MG SL tablet Place 1  tablet (0.4 mg total) under the tongue every 5 (five) minutes x 3 doses as needed for chest pain (if no relief after 2nd dose, proceed to ED or call 911). 02/04/22  Yes Jonelle Sidle, MD  Omega-3 Fatty Acids (OMEGA-3 FISH OIL PO) Take 1,000 mg by mouth in the morning.   Yes [provider]  omeprazole (PRILOSEC) 40 MG capsule Take 1 capsule (40 mg total) by mouth in the morning. 01/10/22  Yes Burns, Bobette Mo, MD  ondansetron (ZOFRAN) 8 MG tablet Take 1 tablet (8 mg total) by mouth every 8 (eight) hours as needed for vomiting or nausea. 05/30/22  Yes Erven Colla, PA-C  Oxycodone HCl 20 MG TABS Take 1 tablet (20 mg total) by  mouth 4 (four) times daily as needed. For severe pain 05/30/22  Yes Erven Colla, PA-C  Polyethyl Glycol-Propyl Glycol (LUBRICANT EYE DROPS) 0.4-0.3 % SOLN Place 1-2 drops into both eyes 3 (three) times daily as needed (dry/irritated eyes.).   Yes [provider]  prochlorperazine (COMPAZINE) 10 MG tablet Take 1 tablet (10 mg total) by mouth every 6 (six) hours as needed for nausea or vomiting. 06/04/22  Yes Si Gaul, MD  rosuvastatin (CRESTOR) 20 MG tablet TAKE HALF A TABLET (10 MG) BY MOUTH ONCE DAILY. 10/08/21  Yes Burns, Bobette Mo, MD  sucralfate (CARAFATE) 1 g tablet Take 1 tablet (1 g total) by mouth 4 (four) times daily -  with meals and at bedtime. Crush and dissolve in 10 mL warm water prior to swallowing, take 20 min before meals 05/30/22  Yes Erven Colla, PA-C  tadalafil (CIALIS) 5 MG tablet Take 5 mg by mouth daily as needed for erectile dysfunction.   Yes [provider]  tamsulosin (FLOMAX) 0.4 MG CAPS capsule Take 0.4 mg by mouth in the morning.   Yes [provider]  Testosterone 20.25 MG/ACT (1.62%) GEL Apply 2 Pump topically in the morning. 07/27/19  Yes [provider]  vitamin C (ASCORBIC ACID) 500 MG tablet Take 500 mg by mouth in the morning.   Yes [provider]    Family History Family History   Problem Relation Age of Onset   Heart attack Father 68   Diabetes Mother    Stroke Mother 18   Hypertension Mother    Leukemia Sister    Colon cancer Paternal Uncle    Aortic aneurysm Brother        abdominal   Heart disease Brother    Atrial fibrillation Brother    Cancer Maternal Uncle         X3:prostate , renal, bone    Parkinson's disease Sister    Stroke Sister    Stroke Sister    Stroke Sister    Atrial fibrillation Sister    Esophageal cancer Neg Hx    Rectal cancer Neg Hx    Stomach cancer Neg Hx     Social History Social History   Tobacco Use   Smoking status: Former    Packs/day: .25    Types: Cigarettes    Quit date: 05/2021    Years since quitting: 1.0   Smokeless tobacco: Never  Vaping Use   Vaping Use: Never used  Substance Use Topics   Alcohol use: No   Drug use: No     Allergies   Latex, Bromocriptine, Zestril [lisinopril], Diovan [valsartan], Hydrochlorothiazide, Norvasc [amlodipine], and Zocor [simvastatin]   Review of Systems Review of Systems As per HPI  Physical Exam Triage Vital Signs ED Triage Vitals [06/06/22 1943]  Enc Vitals Group     BP 123/73     Pulse Rate 81     Resp 18     Temp (!) 97.5 F (36.4 C)     Temp Source Oral     SpO2 92 %     Weight      Height      Head Circumference      Peak Flow      Pain Score      Pain Loc      Pain Edu?      Excl. in GC?    No data found.  Updated Vital Signs BP 123/73 (BP Location: Right Arm)   Pulse 81  Temp (!) 97.5 F (36.4 C) (Oral)   Resp 18   SpO2 92%   Visual Acuity Right Eye Distance:   Left Eye Distance:   Bilateral Distance:    Right Eye Near:   Left Eye Near:    Bilateral Near:     Physical Exam Vitals and nursing note reviewed.  Constitutional:      Appearance: Normal appearance.     Comments: Chronically ill looking patient.  HENT:     Right Ear: Tympanic membrane normal.     Left Ear: Tympanic membrane normal.     Nose: Nose normal.      Mouth/Throat:     Mouth: Mucous membranes are moist.     Pharynx: No oropharyngeal exudate or posterior oropharyngeal erythema.  Eyes:     Extraocular Movements: Extraocular movements intact.     Conjunctiva/sclera: Conjunctivae normal.     Pupils: Pupils are equal, round, and reactive to light.  Cardiovascular:     Rate and Rhythm: Normal rate and regular rhythm.     Pulses: Normal pulses.     Heart sounds: Normal heart sounds.  Pulmonary:     Effort: Pulmonary effort is normal. No respiratory distress.     Breath sounds: Normal breath sounds. No stridor. No wheezing, rhonchi or rales.  Abdominal:     General: Abdomen is flat.     Palpations: Abdomen is soft. There is no mass.     Tenderness: There is no abdominal tenderness. There is no guarding or rebound.  Skin:    General: Skin is warm.     Coloration: Skin is not jaundiced or pale.     Findings: No erythema.  Neurological:     Mental Status: He is alert.      UC Treatments / Results  Labs (all labs ordered are listed, but only abnormal results are displayed) Labs Reviewed - No data to display  EKG   Radiology No results found.  Procedures Procedures (including critical care time)  Medications Ordered in UC Medications - No data to display  Initial Impression / Assessment and Plan / UC Course  I have reviewed the triage vital signs and the nursing notes.  Pertinent labs & imaging results that were available during my care of the patient were reviewed by me and considered in my medical decision making (see chart for details).     1.  Febrile episodes: This is likely related to immunotherapy recently started. Patient's workup in the ED 5 days ago was unremarkable. Patient's physical exam is nonfocal.  Patient is advised to follow-up with his oncologist Patient is advised to continue Tylenol as needed for fever Return precautions given Medical records from a unique source was reviewed during this  encounter. Final Clinical Impressions(s) / UC Diagnoses   Final diagnoses:  Fever, unspecified     Discharge Instructions      Take Tylenol as needed for fever Please follow-up with your oncologist Please maintain adequate hydration If you have worsening symptoms please return to urgent care or go to the emergency department to be evaluated.   ED Prescriptions   None    PDMP not reviewed this encounter.   Merrilee Jansky, MD 06/07/22 [phone]

## 2022-06-12 ENCOUNTER — Telehealth: Payer: Self-pay | Admitting: Medical Oncology

## 2022-06-12 NOTE — Telephone Encounter (Signed)
Feels bad-"I don't feel well overall, Im real tired. This immunotherapy is worse than the chemo. I think  I need fluids". He said his BP is 106/58 so he only took one Hydralazine today.  Nausea -taking zofran every 5 hours . He is forcing himself to eat. He is drinking water , ensure electrolyte powdered drinks. Pain "Under  navel " dull, like a hunger pang/ gnawing/burning. He told me it is not his hernia pain.  A little constipated-I told him he is taking too much zofran.  Per Dr. Arbutus Ped schedule message sent fot IVF tomorrow.

## 2022-06-13 ENCOUNTER — Inpatient Hospital Stay: Payer: Medicare Other

## 2022-06-13 ENCOUNTER — Other Ambulatory Visit: Payer: Self-pay | Admitting: Medical Oncology

## 2022-06-13 ENCOUNTER — Inpatient Hospital Stay: Payer: Medicare Other | Attending: Internal Medicine

## 2022-06-13 VITALS — BP 119/62 | HR 64 | Temp 98.1°F | Resp 18

## 2022-06-13 DIAGNOSIS — R110 Nausea: Secondary | ICD-10-CM

## 2022-06-13 DIAGNOSIS — C3412 Malignant neoplasm of upper lobe, left bronchus or lung: Secondary | ICD-10-CM | POA: Diagnosis not present

## 2022-06-13 DIAGNOSIS — C3411 Malignant neoplasm of upper lobe, right bronchus or lung: Secondary | ICD-10-CM | POA: Insufficient documentation

## 2022-06-13 DIAGNOSIS — Z95828 Presence of other vascular implants and grafts: Secondary | ICD-10-CM

## 2022-06-13 LAB — CBC WITH DIFFERENTIAL (CANCER CENTER ONLY)
Abs Immature Granulocytes: 0.03 10*3/uL (ref 0.00–0.07)
Basophils Absolute: 0 10*3/uL (ref 0.0–0.1)
Basophils Relative: 1 %
Eosinophils Absolute: 0.4 10*3/uL (ref 0.0–0.5)
Eosinophils Relative: 10 %
HCT: 30.6 % — ABNORMAL LOW (ref 39.0–52.0)
Hemoglobin: 9.9 g/dL — ABNORMAL LOW (ref 13.0–17.0)
Immature Granulocytes: 1 %
Lymphocytes Relative: 13 %
Lymphs Abs: 0.6 10*3/uL — ABNORMAL LOW (ref 0.7–4.0)
MCH: 31.2 pg (ref 26.0–34.0)
MCHC: 32.4 g/dL (ref 30.0–36.0)
MCV: 96.5 fL (ref 80.0–100.0)
Monocytes Absolute: 0.8 10*3/uL (ref 0.1–1.0)
Monocytes Relative: 17 %
Neutro Abs: 2.6 10*3/uL (ref 1.7–7.7)
Neutrophils Relative %: 58 %
Platelet Count: 126 10*3/uL — ABNORMAL LOW (ref 150–400)
RBC: 3.17 MIL/uL — ABNORMAL LOW (ref 4.22–5.81)
RDW: 15.9 % — ABNORMAL HIGH (ref 11.5–15.5)
WBC Count: 4.4 10*3/uL (ref 4.0–10.5)
nRBC: 0 % (ref 0.0–0.2)

## 2022-06-13 LAB — CMP (CANCER CENTER ONLY)
ALT: 18 U/L (ref 0–44)
AST: 14 U/L — ABNORMAL LOW (ref 15–41)
Albumin: 3.4 g/dL — ABNORMAL LOW (ref 3.5–5.0)
Alkaline Phosphatase: 56 U/L (ref 38–126)
Anion gap: 7 (ref 5–15)
BUN: 18 mg/dL (ref 8–23)
CO2: 27 mmol/L (ref 22–32)
Calcium: 8.7 mg/dL — ABNORMAL LOW (ref 8.9–10.3)
Chloride: 101 mmol/L (ref 98–111)
Creatinine: 0.87 mg/dL (ref 0.61–1.24)
GFR, Estimated: >60 mL/min (ref >60)
Glucose, Bld: 101 mg/dL — ABNORMAL HIGH (ref 70–99)
Potassium: 3.8 mmol/L (ref 3.5–5.1)
Sodium: 135 mmol/L (ref 135–145)
Total Bilirubin: 0.4 mg/dL (ref 0.3–1.2)
Total Protein: 6.6 g/dL (ref 6.5–8.1)

## 2022-06-13 MED ORDER — SODIUM CHLORIDE 0.9 % IV SOLN: INTRAVENOUS | Status: DC

## 2022-06-13 MED ORDER — HEPARIN SOD (PORK) LOCK FLUSH 100 UNIT/ML IV SOLN
500.0000 [IU] | Freq: Once | INTRAVENOUS | Status: AC
  Administered 2022-06-13: 500 [IU] via INTRAVENOUS

## 2022-06-13 MED ORDER — SODIUM CHLORIDE 0.9% FLUSH
10.0000 mL | Freq: Once | INTRAVENOUS | Status: AC
  Administered 2022-06-13: 10 mL via INTRAVENOUS

## 2022-06-13 NOTE — Progress Notes (Signed)
Lab orders entered

## 2022-06-13 NOTE — Patient Instructions (Signed)

## 2022-06-19 ENCOUNTER — Inpatient Hospital Stay: Payer: Medicare Other | Attending: Internal Medicine

## 2022-06-19 ENCOUNTER — Telehealth: Payer: Self-pay | Admitting: Medical Oncology

## 2022-06-19 ENCOUNTER — Inpatient Hospital Stay (HOSPITAL_BASED_OUTPATIENT_CLINIC_OR_DEPARTMENT_OTHER): Payer: Medicare Other | Admitting: Physician Assistant

## 2022-06-19 ENCOUNTER — Inpatient Hospital Stay: Payer: Medicare Other

## 2022-06-19 VITALS — BP 145/68 | HR 61 | Resp 16

## 2022-06-19 VITALS — BP 116/70 | HR 77 | Temp 98.3°F | Resp 16 | Wt 173.0 lb

## 2022-06-19 DIAGNOSIS — R112 Nausea with vomiting, unspecified: Secondary | ICD-10-CM

## 2022-06-19 DIAGNOSIS — K5900 Constipation, unspecified: Secondary | ICD-10-CM | POA: Diagnosis not present

## 2022-06-19 DIAGNOSIS — C3411 Malignant neoplasm of upper lobe, right bronchus or lung: Secondary | ICD-10-CM | POA: Insufficient documentation

## 2022-06-19 DIAGNOSIS — K5730 Diverticulosis of large intestine without perforation or abscess without bleeding: Secondary | ICD-10-CM | POA: Insufficient documentation

## 2022-06-19 DIAGNOSIS — Z806 Family history of leukemia: Secondary | ICD-10-CM | POA: Insufficient documentation

## 2022-06-19 DIAGNOSIS — K209 Esophagitis, unspecified without bleeding: Secondary | ICD-10-CM | POA: Insufficient documentation

## 2022-06-19 DIAGNOSIS — N400 Enlarged prostate without lower urinary tract symptoms: Secondary | ICD-10-CM | POA: Diagnosis not present

## 2022-06-19 DIAGNOSIS — I700 Atherosclerosis of aorta: Secondary | ICD-10-CM | POA: Insufficient documentation

## 2022-06-19 DIAGNOSIS — Z79899 Other long term (current) drug therapy: Secondary | ICD-10-CM | POA: Insufficient documentation

## 2022-06-19 DIAGNOSIS — R110 Nausea: Secondary | ICD-10-CM

## 2022-06-19 DIAGNOSIS — R1084 Generalized abdominal pain: Secondary | ICD-10-CM | POA: Insufficient documentation

## 2022-06-19 DIAGNOSIS — Z923 Personal history of irradiation: Secondary | ICD-10-CM | POA: Insufficient documentation

## 2022-06-19 DIAGNOSIS — C771 Secondary and unspecified malignant neoplasm of intrathoracic lymph nodes: Secondary | ICD-10-CM | POA: Insufficient documentation

## 2022-06-19 DIAGNOSIS — Z800 Family history of malignant neoplasm of digestive organs: Secondary | ICD-10-CM | POA: Diagnosis not present

## 2022-06-19 DIAGNOSIS — Z95828 Presence of other vascular implants and grafts: Secondary | ICD-10-CM

## 2022-06-19 DIAGNOSIS — C3412 Malignant neoplasm of upper lobe, left bronchus or lung: Secondary | ICD-10-CM | POA: Diagnosis not present

## 2022-06-19 DIAGNOSIS — D649 Anemia, unspecified: Secondary | ICD-10-CM | POA: Insufficient documentation

## 2022-06-19 DIAGNOSIS — R351 Nocturia: Secondary | ICD-10-CM

## 2022-06-19 DIAGNOSIS — Z87891 Personal history of nicotine dependence: Secondary | ICD-10-CM | POA: Diagnosis not present

## 2022-06-19 DIAGNOSIS — C3491 Malignant neoplasm of unspecified part of right bronchus or lung: Secondary | ICD-10-CM

## 2022-06-19 DIAGNOSIS — E860 Dehydration: Secondary | ICD-10-CM | POA: Insufficient documentation

## 2022-06-19 LAB — CBC WITH DIFFERENTIAL (CANCER CENTER ONLY)
Abs Immature Granulocytes: 0.02 10*3/uL (ref 0.00–0.07)
Basophils Absolute: 0 10*3/uL (ref 0.0–0.1)
Basophils Relative: 1 %
Eosinophils Absolute: 0.2 10*3/uL (ref 0.0–0.5)
Eosinophils Relative: 5 %
HCT: 31.5 % — ABNORMAL LOW (ref 39.0–52.0)
Hemoglobin: 10.5 g/dL — ABNORMAL LOW (ref 13.0–17.0)
Immature Granulocytes: 1 %
Lymphocytes Relative: 16 %
Lymphs Abs: 0.6 10*3/uL — ABNORMAL LOW (ref 0.7–4.0)
MCH: 32.1 pg (ref 26.0–34.0)
MCHC: 33.3 g/dL (ref 30.0–36.0)
MCV: 96.3 fL (ref 80.0–100.0)
Monocytes Absolute: 0.6 10*3/uL (ref 0.1–1.0)
Monocytes Relative: 16 %
Neutro Abs: 2.3 10*3/uL (ref 1.7–7.7)
Neutrophils Relative %: 61 %
Platelet Count: 127 10*3/uL — ABNORMAL LOW (ref 150–400)
RBC: 3.27 MIL/uL — ABNORMAL LOW (ref 4.22–5.81)
RDW: 16.1 % — ABNORMAL HIGH (ref 11.5–15.5)
WBC Count: 3.7 10*3/uL — ABNORMAL LOW (ref 4.0–10.5)
nRBC: 0 % (ref 0.0–0.2)

## 2022-06-19 LAB — CMP (CANCER CENTER ONLY)
ALT: 13 U/L (ref 0–44)
AST: 13 U/L — ABNORMAL LOW (ref 15–41)
Albumin: 3.1 g/dL — ABNORMAL LOW (ref 3.5–5.0)
Alkaline Phosphatase: 54 U/L (ref 38–126)
Anion gap: 7 (ref 5–15)
BUN: 20 mg/dL (ref 8–23)
CO2: 26 mmol/L (ref 22–32)
Calcium: 8.9 mg/dL (ref 8.9–10.3)
Chloride: 105 mmol/L (ref 98–111)
Creatinine: 0.69 mg/dL (ref 0.61–1.24)
GFR, Estimated: >60 mL/min (ref >60)
Glucose, Bld: 117 mg/dL — ABNORMAL HIGH (ref 70–99)
Potassium: 4.1 mmol/L (ref 3.5–5.1)
Sodium: 138 mmol/L (ref 135–145)
Total Bilirubin: 0.3 mg/dL (ref 0.3–1.2)
Total Protein: 6.9 g/dL (ref 6.5–8.1)

## 2022-06-19 LAB — URINALYSIS, COMPLETE (UACMP) WITH MICROSCOPIC
Bacteria, UA: NONE SEEN
Bilirubin Urine: NEGATIVE
Glucose, UA: NEGATIVE mg/dL
Hgb urine dipstick: NEGATIVE
Ketones, ur: NEGATIVE mg/dL
Leukocytes,Ua: NEGATIVE
Nitrite: NEGATIVE
Protein, ur: NEGATIVE mg/dL
Specific Gravity, Urine: 1.016 (ref 1.005–1.030)
pH: 6 (ref 5.0–8.0)

## 2022-06-19 MED ORDER — SODIUM CHLORIDE 0.9 % IV SOLN: Freq: Once | INTRAVENOUS | Status: AC

## 2022-06-19 MED ORDER — HEPARIN SOD (PORK) LOCK FLUSH 100 UNIT/ML IV SOLN
500.0000 [IU] | Freq: Once | INTRAVENOUS | Status: AC
  Administered 2022-06-19: 500 [IU]

## 2022-06-19 MED ORDER — SODIUM CHLORIDE 0.9% FLUSH
10.0000 mL | Freq: Once | INTRAVENOUS | Status: AC
  Administered 2022-06-19: 10 mL

## 2022-06-19 NOTE — Telephone Encounter (Signed)
Pt confirmed appts for today 

## 2022-06-19 NOTE — Patient Instructions (Signed)

## 2022-06-19 NOTE — Progress Notes (Signed)
Symptom Management Consult Note [facility]    Patient Care Team: [name], MD as PCP - General (Internal Medicine) Jonelle Sidle, MD as PCP - Cardiology (Cardiology) Meryl Dare, MD as Consulting Physician (Gastroenterology) Darci Needle, MD as Consulting Physician (Endocrinology) Kathyrn Sheriff, [facility] as Pharmacist (Pharmacist) Maris Berger, MD as Consulting Physician (Ophthalmology) Glendale Chard, DO as Consulting Physician (Neurology)    Name / MRN / DOB: Patrick Harris  [id number]  [birth year]   Date of visit: 06/19/2022   Chief Complaint/Reason for visit: abdominal pain, fatigue   Current Therapy: maintenance treatment with Imfinzi 1500 Mg IV every 4 weeks   Last treatment:  Day 1   Cycle 1 on 05/28/22   ASSESSMENT & PLAN: Patient is a 77 y.o. male  with oncologic history of  Stage IIIA (T1c, N 2, M0) adenocarcinoma in the right upper lobe as well as stage IA (T1c, N0, M0) squamous cell carcinoma involving the right upper lobe and stage IA (T1b, N0, M0) adenocarcinoma involving the left upper lobe followed by Dr. Arbutus Ped.  I have viewed most recent oncology note and lab work.    #Stage IIIA (T1c, N 2, M0) adenocarcinoma in the right upper lobe as well as stage IA (T1c, N0, M0) squamous cell carcinoma involving the right upper lobe and stage IA (T1b, N0, M0) adenocarcinoma involving the left upper lobe  - Recently started immunotherapy.  Next appointment with oncologist is 06/26/22  #Abdominal pain -Acute on chronic.  Exam today patient has neurolyse abdominal tenderness without peritoneal signs.  He is afebrile and hemodynamically stable. -Chart review shows no recent abdominal imaging. -Will get UA to check for infection and CT AP further evaluate his symptoms.  CMP is without electrolyte derangement, no renal insufficiency, no transaminitis.  Patient received a liter of IV fluids in clinic for hydration support.  On  reassessment he is tolerating p.o. intake. -Patient is agreeable with this plan.  #Fatigue -CBC with stable anemia. -Is an adverse effect of Imfinzi. -After further discussion with patient he is also having difficulty at night sleeping because he has to use the bathroom frequently which causes him to be tired during the day.  Question if this is truly an adverse effect of his treatment. -Patient plans to attempt to increase his activity level.  He will discuss fatigue levels with oncologist at future appointments if it continues to be a problem.  Shared visit with Dr. Arbutus Ped   Heme/Onc History: Oncology History  Non-small cell cancer of right lung (HCC)  08/23/2021 Initial Diagnosis   Non-small cell cancer of right lung (HCC)   03/20/2022 - 04/08/2022 Chemotherapy   Patient is on Treatment Plan : LUNG Pemetrexed + Carboplatin q21d     05/28/2022 -  Chemotherapy   Patient is on Treatment Plan : LUNG NSCLC Durvalumab (1500) q28d         Interval history-: Patrick Harris is a 77 y.o. male with oncologic history as above presenting to [facility] today with chief complaint of fatigue and abdominal pain.  He is accompanied by his spouse who provides additional history.  Patient states he has had abdominal pain since he started the immunotherapy.  He states the day after his first treatment is started.  It is located in the center of his abdomen and radiates throughout.  The pain is constant and he rates it 7 out of 10 in severity.  He describes the pain as an ache,  feels like severe hunger pain he cannot resolved.  He does have intermittent constipation that resolved with an enema.  His last bowel movement was this morning and he no longer feels constipated.  He does have decreased fluid intake and appetite because his taste is abnormal from recent chemo and radiation he reports.  He has not had any recent fevers.  He does admit to dysuria, unsure when that started.  No over-the-counter medications  tried prior to arrival today.  Patient is also concerned about his level of fatigue.  He reports that he is up multiple times in the night because of urinary frequency, which is not new.  He admits to oozing off in the chair throughout the day because he is so tired.  Patient feels weak and as if he is unable to perform his activities of daily living.  He does not get much exercise during the day.  He denies any shortness of breath or abnormal bleeding.      ROS  All other systems are reviewed and are negative for acute change except as noted in the HPI.    Allergies  Allergen Reactions   Latex Dermatitis    Contact  dermatitis   Bromocriptine Nausea Only    Dry heaves   Zestril [Lisinopril] Other (See Comments)    Leg pain, inc HR, dizziness, headache   Diovan [Valsartan] Nausea Only   Hydrochlorothiazide Other (See Comments)    Possibly caused pancreatitis 2018   Norvasc [Amlodipine] Other (See Comments)    Edema    Zocor [Simvastatin]     Joint pain     Past Medical History:  Diagnosis Date   Arthritis    CAD (coronary artery disease)    CABG with LIMA to LAD and RIMA to RCA August 2000 - Dr. Dorris Fetch   Cancer [facility])    basal cell   CHF (congestive heart failure) (HCC)    COPD (chronic obstructive pulmonary disease) (HCC)    Elevated glycated hemoglobin    Essential hypertension    GERD (gastroesophageal reflux disease)    Heart murmur    History of radiation therapy    Right lung(chest) 03/19/22-05/06/22- Dr. Antony Blackbird   Hyperlipidemia    Hypopituitarism [facility])    Lung cancer (HCC)    Osteopenia    Dr Juleen China   Pancreatitis    Peyronie disease    Testosterone deficiency    Dr Juleen China     Past Surgical History:  Procedure Laterality Date   Bone spur  2006   5th toe bilaterally   BRONCHIAL BIOPSY  07/02/2021   Procedure: BRONCHIAL BIOPSIES;  Surgeon: Leslye Peer, MD;  Location: [facility] ENDOSCOPY;  Service: Pulmonary;;   BRONCHIAL BRUSHINGS  07/02/2021    Procedure: BRONCHIAL BRUSHINGS;  Surgeon: Leslye Peer, MD;  Location: [facility] ENDOSCOPY;  Service: Pulmonary;;   BRONCHIAL NEEDLE ASPIRATION BIOPSY  07/02/2021   Procedure: BRONCHIAL NEEDLE ASPIRATION BIOPSIES;  Surgeon: Leslye Peer, MD;  Location: [facility] ENDOSCOPY;  Service: Pulmonary;;   COLONOSCOPY  2007   negative; Dr Jarold Motto   CORONARY ARTERY BYPASS GRAFT  2001   2 vessels   FIDUCIAL MARKER PLACEMENT  07/02/2021   Procedure: FIDUCIAL MARKER PLACEMENT;  Surgeon: Leslye Peer, MD;  Location: [facility] ENDOSCOPY;  Service: Pulmonary;;   fracture heel  1967   Bil/ due to fall off ladder   Fractured calcaneus  1969   bilaterally w/ ankle fusion   HERNIA REPAIR  1984   right inguinal hernia  KNEE SURGERY Left    LOBECTOMY Right 11/13/2021   MENISECTOMY  2007   R medial, Dr. Ophelia Charter   PORTA CATH INSERTION     SHOULDER SURGERY  1994   left   UPPER GASTROINTESTINAL ENDOSCOPY  2007   GERD, Dr Jarold Motto   VASECTOMY     VIDEO BRONCHOSCOPY WITH RADIAL ENDOBRONCHIAL ULTRASOUND  07/02/2021   Procedure: VIDEO BRONCHOSCOPY WITH RADIAL ENDOBRONCHIAL ULTRASOUND;  Surgeon: Leslye Peer, MD;  Location: [facility] ENDOSCOPY;  Service: Pulmonary;;    Social History   Socioeconomic History   Marital status: Married    Spouse name: Not on file   Number of children: 1   Years of education: Not on file   Highest education level: Not on file  Occupational History   Occupation: retired  Tobacco Use   Smoking status: Former    Packs/day: .25    Types: Cigarettes    Quit date: 05/2021    Years since quitting: 1.1   Smokeless tobacco: Never  Vaping Use   Vaping Use: Never used  Substance and Sexual Activity   Alcohol use: No   Drug use: No   Sexual activity: Yes  Other Topics Concern   Not on file  Social History Narrative   Patients daughter passed away at 35 from Breast Cancer.       Right Handed    Lives in two story home    Social Determinants of Health   Financial Resource Strain:  Low Risk  (09/24/2021)   Overall Financial Resource Strain (CARDIA)    Difficulty of Paying Living Expenses: Not hard at all  Food Insecurity: No Food Insecurity (06/06/2022)   Hunger Vital Sign    Worried About Running Out of Food in the Last Year: Never true    Ran Out of Food in the Last Year: Never true  Transportation Needs: No Transportation Needs (06/06/2022)   PRAPARE - Administrator, Civil Service (Medical): No    Lack of Transportation (Non-Medical): No  Physical Activity: Insufficiently Active (09/24/2021)   Exercise Vital Sign    Days of Exercise per Week: 3 days    Minutes of Exercise per Session: 30 min  Stress: No Stress Concern Present (09/24/2021)   [employer] of Occupational Health - Occupational Stress Questionnaire    Feeling of Stress : Not at all  Social Connections: Moderately Integrated (09/24/2021)   Social Connection and Isolation Panel [NHANES]    Frequency of Communication with Friends and Family: More than three times a week    Frequency of Social Gatherings with Friends and Family: More than three times a week    Attends Religious Services: More than 4 times per year    Active Member of [employer] or Organizations: No    Attends Banker Meetings: Never    Marital Status: Married  Catering manager Violence: Not At Risk (09/24/2021)   Humiliation, Afraid, Rape, and Kick questionnaire    Fear of Current or Ex-Partner: No    Emotionally Abused: No    Physically Abused: No    Sexually Abused: No    Family History  Problem Relation Age of Onset   Heart attack Father 31   Diabetes Mother    Stroke Mother 47   Hypertension Mother    Leukemia Sister    Colon cancer Paternal Uncle    Aortic aneurysm Brother        abdominal   Heart disease Brother    Atrial fibrillation Brother  Cancer Maternal Uncle         X3:prostate , renal, bone    Parkinson's disease Sister    Stroke Sister    Stroke Sister    Stroke Sister    Atrial  fibrillation Sister    Esophageal cancer Neg Hx    Rectal cancer Neg Hx    Stomach cancer Neg Hx      Current Outpatient Medications:    aspirin EC 81 MG tablet, Take 1 tablet (81 mg total) by mouth at bedtime. Okay to restart this medication on 07/03/2021, Disp: 30 tablet, Rfl: 11   Cholecalciferol (VITAMIN D3) 2000 UNITS TABS, Take 2,000 Units by mouth in the morning and at bedtime., Disp: , Rfl:    Coenzyme Q10 (COQ-10) 100 MG CAPS, Take 100 mg by mouth in the morning., Disp: , Rfl:    Cyanocobalamin (B-12 PO), Take 1 tablet by mouth daily., Disp: , Rfl:    dexamethasone (DECADRON) 4 MG tablet, Take 1 tablet (4 mg total) by mouth daily., Disp: 4 tablet, Rfl: 0   fluconazole (DIFLUCAN) 100 MG tablet, Take 1 tablet (100 mg total) by mouth daily., Disp: 7 tablet, Rfl: 0   folic acid (FOLVITE) 1 MG tablet, TAKE 1 TABLET BY MOUTH EVERY DAY, Disp: 90 tablet, Rfl: 1   hydrALAZINE (APRESOLINE) 25 MG tablet, Take 1 tablet (25 mg total) by mouth 3 (three) times daily., Disp: 270 tablet, Rfl: 3   irbesartan (AVAPRO) 300 MG tablet, Take 1 tablet (300 mg total) by mouth daily., Disp: 90 tablet, Rfl: 3   lidocaine (XYLOCAINE) 2 % solution, Use as directed 15 mLs in the mouth or throat as needed for mouth pain. Swallow 20 min prior to meals and at bedtime, Disp: 250 mL, Rfl: 2   LORazepam (ATIVAN) 0.5 MG tablet, Take 1 tablet (0.5 mg total) by mouth every 8 (eight) hours as needed for anxiety., Disp: 10 tablet, Rfl: 0   nitroGLYCERIN (NITROSTAT) 0.4 MG SL tablet, Place 1 tablet (0.4 mg total) under the tongue every 5 (five) minutes x 3 doses as needed for chest pain (if no relief after 2nd dose, proceed to ED or call 911)., Disp: 25 tablet, Rfl: 3   Omega-3 Fatty Acids (OMEGA-3 FISH OIL PO), Take 1,000 mg by mouth in the morning., Disp: , Rfl:    omeprazole (PRILOSEC) 40 MG capsule, Take 1 capsule (40 mg total) by mouth in the morning., Disp: , Rfl:    ondansetron (ZOFRAN) 8 MG tablet, Take 1 tablet (8 mg  total) by mouth every 8 (eight) hours as needed for vomiting or nausea., Disp: 20 tablet, Rfl: 2   Oxycodone HCl 20 MG TABS, Take 1 tablet (20 mg total) by mouth 4 (four) times daily as needed. For severe pain, Disp: 30 tablet, Rfl: 0   Polyethyl Glycol-Propyl Glycol (LUBRICANT EYE DROPS) 0.4-0.3 % SOLN, Place 1-2 drops into both eyes 3 (three) times daily as needed (dry/irritated eyes.)., Disp: , Rfl:    prochlorperazine (COMPAZINE) 10 MG tablet, Take 1 tablet (10 mg total) by mouth every 6 (six) hours as needed for nausea or vomiting., Disp: 20 tablet, Rfl: 0   rosuvastatin (CRESTOR) 20 MG tablet, TAKE HALF A TABLET (10 MG) BY MOUTH ONCE DAILY., Disp: 45 tablet, Rfl: 3   sucralfate (CARAFATE) 1 g tablet, Take 1 tablet (1 g total) by mouth 4 (four) times daily -  with meals and at bedtime. Crush and dissolve in 10 mL warm water prior to swallowing, take 20  min before meals, Disp: 120 tablet, Rfl: 1   tadalafil (CIALIS) 5 MG tablet, Take 5 mg by mouth daily as needed for erectile dysfunction., Disp: , Rfl:    tamsulosin (FLOMAX) 0.4 MG CAPS capsule, Take 0.4 mg by mouth in the morning., Disp: , Rfl:    Testosterone 20.25 MG/ACT (1.62%) GEL, Apply 2 Pump topically in the morning., Disp: , Rfl:    vitamin C (ASCORBIC ACID) 500 MG tablet, Take 500 mg by mouth in the morning., Disp: , Rfl:   PHYSICAL EXAM: ECOG FS:1 - Symptomatic but completely ambulatory    Vitals:   06/19/22 1353  BP: 116/70  Pulse: 77  Resp: 16  Temp: 98.3 F (36.8 C)  TempSrc: Oral  SpO2: 97%  Weight: 173 lb (78.5 kg)   Physical Exam Vitals and nursing note reviewed.  Constitutional:      Appearance: He is not ill-appearing or toxic-appearing.  HENT:     Head: Normocephalic.     Mouth/Throat:     Mouth: Mucous membranes are dry.  Eyes:     Conjunctiva/sclera: Conjunctivae normal.  Cardiovascular:     Rate and Rhythm: Normal rate and regular rhythm.     Pulses: Normal pulses.     Heart sounds: Normal heart  sounds.  Pulmonary:     Effort: Pulmonary effort is normal.     Breath sounds: Normal breath sounds.  Abdominal:     General: Bowel sounds are normal. There is no distension.     Palpations: Abdomen is soft. There is no mass.     Tenderness: There is abdominal tenderness (Generalized). There is no guarding or rebound.  Musculoskeletal:     Cervical back: Normal range of motion.     Right lower leg: No edema.     Left lower leg: No edema.  Skin:    General: Skin is warm and dry.  Neurological:     Mental Status: He is alert.        LABORATORY DATA: I have reviewed the data as listed    Latest Ref Rng & Units 06/19/2022    1:27 PM 06/13/2022   12:05 PM 06/02/2022    9:30 PM  CBC  WBC 4.0 - 10.5 K/uL 3.7  4.4  3.4   Hemoglobin 13.0 - 17.0 g/dL 16.1  9.9  9.6   Hematocrit 39.0 - 52.0 % 31.5  30.6  28.7   Platelets 150 - 400 K/uL 127  126  114         Latest Ref Rng & Units 06/19/2022    1:27 PM 06/13/2022   12:05 PM 06/02/2022    9:30 PM  CMP  Glucose 70 - 99 mg/dL 096  045  409   BUN 8 - 23 mg/dL 20  18  17    Creatinine 0.61 - 1.24 mg/dL 8.11  9.14  7.82   Sodium 135 - 145 mmol/L 138  135  130   Potassium 3.5 - 5.1 mmol/L 4.1  3.8  4.1   Chloride 98 - 111 mmol/L 105  101  96   CO2 22 - 32 mmol/L 26  27  21    Calcium 8.9 - 10.3 mg/dL 8.9  8.7  8.6   Total Protein 6.5 - 8.1 g/dL 6.9  6.6  6.9   Total Bilirubin 0.3 - 1.2 mg/dL 0.3  0.4  0.5   Alkaline Phos 38 - 126 U/L 54  56  52   AST 15 - 41 U/L 13  14  19   ALT 0 - 44 U/L 13  18  22         RADIOGRAPHIC STUDIES (from last 24 hours if applicable) I have personally reviewed the radiological images as listed and agreed with the findings in the report. No results found.      Visit Diagnosis: 1. Nausea and vomiting, unspecified vomiting type   2. Generalized abdominal pain      Orders Placed This Encounter  Procedures   CT Abdomen Pelvis W Contrast    Standing Status:   Future    Standing Expiration Date:    06/19/2023    Order Specific Question:   If indicated for the ordered procedure, I authorize the administration of contrast media per Radiology protocol    Answer:   Yes    Order Specific Question:   Does the patient have a contrast media/X-ray dye allergy?    Answer:   No    Order Specific Question:   Preferred imaging location?    Answer:   [facility]    Order Specific Question:   If indicated for the ordered procedure, I authorize the administration of oral contrast media per Radiology protocol    Answer:   Yes    All questions were answered. The patient knows to call the clinic with any problems, questions or concerns. No barriers to learning was detected.  A total of more than 30 minutes were spent on this encounter with face-to-face time and non-face-to-face time, including preparing to see the patient, ordering tests and/or medications, counseling the patient and coordination of care as outlined above.    Thank you for allowing me to participate in the care of this patient.    Shanon Ace, PA-C Department of Hematology/Oncology [facility] at [facility] Phone: [phone]  [phone]    06/19/2022 4:26 PM  ADDENDUM: Hematology/Oncology Attending: I had a face-to-face encounter with the patient today.  I reviewed his record, lab and recommended his care plan.  This is a very pleasant 77 years old white male with stage IIIa non-small cell lung cancer, adenocarcinoma diagnosed in June 2023.  The patient was initially diagnosed with stage Ia squamous cell carcinoma involving the right upper lobe and a stage Ia adenocarcinoma involving the left upper lobe He is status post neoadjuvant systemic chemotherapy with carboplatin, paclitaxel and nivolumab followed by right upper lobectomy with lymph node dissection at [facility] but the final pathology showed residual disease with synchronous adenocarcinoma as well as multiple  mediastinal lymph node metastasis in November 2023.  He also has evidence of disease recurrence involving 4R lymphadenopathy in February 2024 and molecular studies showed PD-L1 expression of 15% in the adenocarcinoma in 2% in the squamous cell carcinoma. The patient was treated with a course of concurrent chemoradiation with 2 cycles of carboplatin and Alimta.  He had no evidence for disease progression after this course of concurrent chemoradiation and he started treatment with consolidation immunotherapy with Imfinzi 1500 Mg IV every 4 weeks.  First cycle was given on May 29, 2022. The patient has been complaining of increasing fatigue and weakness as well as intermittent abdominal pain after his first cycle of the treatment and also residual from the previous concurrent chemoradiation. I had a lengthy discussion with the patient and his wife today about his condition.  I recommended for him to have CT scan of the abdomen pelvis to rule out any metastatic disease in the abdomen or any other  acute abnormality. Will also arrange for the patient to receive IV hydration with normal saline. He is expected to start cycle #2 next week and if the patient is not feeling well we will hold his treatment until improvement of his status. I also discussed with him discontinuation of the immunotherapy completely if he cannot tolerate it any further. The patient would like to continue with treatment if possible. He will come back for follow-up visit next week for evaluation before starting cycle #2. He was advised to call immediately if he has any other concerning symptoms in the interval. The total time spent in the appointment was 30 minutes. Disclaimer: This note was dictated with voice recognition software. Similar sounding words can inadvertently be transcribed and may be missed upon review. Lajuana Matte, MD

## 2022-06-19 NOTE — Telephone Encounter (Signed)
Symptoms- "I feel bad, very weak, I  can hardly move." He has abdominal pain "behind navel".  He was constipated and used enema yesterday and had good BM and still has the abd pain.  05/21-cycle 1 day 1 Imfinzi

## 2022-06-20 ENCOUNTER — Ambulatory Visit (HOSPITAL_COMMUNITY)
Admission: RE | Admit: 2022-06-20 | Discharge: 2022-06-20 | Disposition: A | Discharge status: Home / Self Care | Payer: Medicare Other | Source: Ambulatory Visit | Attending: Physician Assistant | Admitting: Physician Assistant

## 2022-06-20 ENCOUNTER — Other Ambulatory Visit: Payer: Self-pay | Admitting: Physician Assistant

## 2022-06-20 ENCOUNTER — Ambulatory Visit (HOSPITAL_BASED_OUTPATIENT_CLINIC_OR_DEPARTMENT_OTHER): Payer: Medicare Other

## 2022-06-20 ENCOUNTER — Telehealth: Payer: Self-pay | Admitting: Internal Medicine

## 2022-06-20 DIAGNOSIS — I700 Atherosclerosis of aorta: Secondary | ICD-10-CM | POA: Diagnosis not present

## 2022-06-20 DIAGNOSIS — R112 Nausea with vomiting, unspecified: Secondary | ICD-10-CM | POA: Insufficient documentation

## 2022-06-20 DIAGNOSIS — R1110 Vomiting, unspecified: Secondary | ICD-10-CM | POA: Diagnosis not present

## 2022-06-20 DIAGNOSIS — R109 Unspecified abdominal pain: Secondary | ICD-10-CM | POA: Diagnosis not present

## 2022-06-20 DIAGNOSIS — R1084 Generalized abdominal pain: Secondary | ICD-10-CM | POA: Diagnosis not present

## 2022-06-20 LAB — URINE CULTURE: Culture: NO GROWTH

## 2022-06-20 MED ORDER — IOHEXOL 300 MG/ML  SOLN
100.0000 mL | Freq: Once | INTRAMUSCULAR | Status: AC | PRN
  Administered 2022-06-20: 100 mL via INTRAVENOUS

## 2022-06-20 MED ORDER — AMOXICILLIN-POT CLAVULANATE 875-125 MG PO TABS: 1.0000 | ORAL_TABLET | Freq: Two times a day (BID) | ORAL | 0 refills | Status: AC

## 2022-06-20 MED ORDER — AZITHROMYCIN 250 MG PO TABS: ORAL_TABLET | ORAL | 0 refills | Status: AC

## 2022-06-20 NOTE — Telephone Encounter (Signed)
Patient is aware of updated appointment dates/times.

## 2022-06-20 NOTE — Progress Notes (Signed)
Patient seen in [facility] on 06/19/22 and had CT AP today 6/13. Image is negative for abdominal pathology. It does show concern for infection with focal airspace opacity in medial right lower lobe. Patient /reported intermittent fevers and productive cough so it is reasonable to treat with course of antibiotics. Prescriptions for z-pak and Augmentin sent to pharmacy. Patient will follow up with oncologist.

## 2022-06-21 ENCOUNTER — Telehealth: Payer: Self-pay

## 2022-06-21 ENCOUNTER — Encounter (HOSPITAL_BASED_OUTPATIENT_CLINIC_OR_DEPARTMENT_OTHER): Payer: Self-pay | Admitting: Emergency Medicine

## 2022-06-21 ENCOUNTER — Emergency Department (HOSPITAL_BASED_OUTPATIENT_CLINIC_OR_DEPARTMENT_OTHER)
Admission: EM | Admit: 2022-06-21 | Discharge: 2022-06-22 | Disposition: A | Discharge status: Home / Self Care | Payer: Medicare Other | Attending: Emergency Medicine | Admitting: Emergency Medicine

## 2022-06-21 ENCOUNTER — Other Ambulatory Visit: Payer: Self-pay

## 2022-06-21 DIAGNOSIS — I509 Heart failure, unspecified: Secondary | ICD-10-CM | POA: Insufficient documentation

## 2022-06-21 DIAGNOSIS — R1013 Epigastric pain: Secondary | ICD-10-CM | POA: Insufficient documentation

## 2022-06-21 DIAGNOSIS — Z79899 Other long term (current) drug therapy: Secondary | ICD-10-CM | POA: Diagnosis not present

## 2022-06-21 DIAGNOSIS — Z7982 Long term (current) use of aspirin: Secondary | ICD-10-CM | POA: Insufficient documentation

## 2022-06-21 DIAGNOSIS — I110 Hypertensive heart disease with heart failure: Secondary | ICD-10-CM | POA: Diagnosis not present

## 2022-06-21 DIAGNOSIS — I2510 Atherosclerotic heart disease of native coronary artery without angina pectoris: Secondary | ICD-10-CM | POA: Diagnosis not present

## 2022-06-21 DIAGNOSIS — J449 Chronic obstructive pulmonary disease, unspecified: Secondary | ICD-10-CM | POA: Insufficient documentation

## 2022-06-21 LAB — COMPREHENSIVE METABOLIC PANEL
ALT: 12 U/L (ref 0–44)
AST: 13 U/L — ABNORMAL LOW (ref 15–41)
Albumin: 3.6 g/dL (ref 3.5–5.0)
Alkaline Phosphatase: 56 U/L (ref 38–126)
Anion gap: 8 (ref 5–15)
BUN: 16 mg/dL (ref 8–23)
CO2: 25 mmol/L (ref 22–32)
Calcium: 9.1 mg/dL (ref 8.9–10.3)
Chloride: 101 mmol/L (ref 98–111)
Creatinine, Ser: 0.77 mg/dL (ref 0.61–1.24)
GFR, Estimated: >60 mL/min (ref >60)
Glucose, Bld: 97 mg/dL (ref 70–99)
Potassium: 4.2 mmol/L (ref 3.5–5.1)
Sodium: 134 mmol/L — ABNORMAL LOW (ref 135–145)
Total Bilirubin: 0.4 mg/dL (ref 0.3–1.2)
Total Protein: 6.8 g/dL (ref 6.5–8.1)

## 2022-06-21 LAB — URINALYSIS, ROUTINE W REFLEX MICROSCOPIC
Bilirubin Urine: NEGATIVE
Glucose, UA: NEGATIVE mg/dL
Hgb urine dipstick: NEGATIVE
Ketones, ur: NEGATIVE mg/dL
Leukocytes,Ua: NEGATIVE
Nitrite: NEGATIVE
Protein, ur: NEGATIVE mg/dL
Specific Gravity, Urine: 1.013 (ref 1.005–1.030)
pH: 7.5 (ref 5.0–8.0)

## 2022-06-21 LAB — CBC
HCT: 32.3 % — ABNORMAL LOW (ref 39.0–52.0)
Hemoglobin: 10.5 g/dL — ABNORMAL LOW (ref 13.0–17.0)
MCH: 31.3 pg (ref 26.0–34.0)
MCHC: 32.5 g/dL (ref 30.0–36.0)
MCV: 96.1 fL (ref 80.0–100.0)
Platelets: 140 10*3/uL — ABNORMAL LOW (ref 150–400)
RBC: 3.36 MIL/uL — ABNORMAL LOW (ref 4.22–5.81)
RDW: 16.2 % — ABNORMAL HIGH (ref 11.5–15.5)
WBC: 3.8 10*3/uL — ABNORMAL LOW (ref 4.0–10.5)
nRBC: 0 % (ref 0.0–0.2)

## 2022-06-21 LAB — LIPASE, BLOOD: Lipase: <10 U/L — ABNORMAL LOW (ref 11–51)

## 2022-06-21 MED ORDER — ALUM & MAG HYDROXIDE-SIMETH 200-200-20 MG/5ML PO SUSP
30.0000 mL | Freq: Once | ORAL | Status: AC
  Administered 2022-06-21: 30 mL via ORAL
  Filled 2022-06-21: qty 30

## 2022-06-21 MED ORDER — LIDOCAINE VISCOUS HCL 2 % MT SOLN
15.0000 mL | Freq: Once | OROMUCOSAL | Status: AC
  Administered 2022-06-21: 15 mL via ORAL
  Filled 2022-06-21: qty 15

## 2022-06-21 MED ORDER — SUCRALFATE 1 GM/10ML PO SUSP
1.0000 g | Freq: Once | ORAL | Status: AC
  Administered 2022-06-21: 1 g via ORAL
  Filled 2022-06-21: qty 10

## 2022-06-21 NOTE — ED Provider Notes (Signed)
[place] EMERGENCY DEPARTMENT AT [facility] Provider Note  CSN: [id number] Arrival date & time: 06/21/22 1934  Chief Complaint(s) Abdominal Pain  HPI Patrick Harris is a 77 y.o. male with past medical history as below, significant for Stage IIIa adenocarcinoma of right upper lobe and stage Ia squamous cell carcinoma of right upper lobe, stage Ia adenocarcinoma of left upper lobe, Dr. Arbutus Ped primary oncologist. CAD, pancreatitis who presents to the ED with complaint of epigastric discomfort.  Patient has been having intermittent epigastric, upper abdominal discomfort over the past month.  He was recent diagnosed with pneumonia through his oncology office.  Pain is worsened with p.o. intake.  Unrelieved with his Percocet that he was taking for his cancer-related pain.  No nausea or vomiting.  No change in bowel or bladder function.  No fevers or past 24 hours.  No recent travel or sick contacts.  Appointment with gastroenterology on Monday.  Past Medical History Past Medical History:  Diagnosis Date   Arthritis    CAD (coronary artery disease)    CABG with LIMA to LAD and RIMA to RCA August 2000 - Dr. Dorris Fetch   Cancer [facility])    basal cell   CHF (congestive heart failure) (HCC)    COPD (chronic obstructive pulmonary disease) (HCC)    Elevated glycated hemoglobin    Essential hypertension    GERD (gastroesophageal reflux disease)    Heart murmur    History of radiation therapy    Right lung(chest) 03/19/22-05/06/22- Dr. Antony Blackbird   Hyperlipidemia    Hypopituitarism (HCC)    Lung cancer (HCC)    Osteopenia    Dr Juleen China   Pancreatitis    Peyronie disease    Testosterone deficiency    Dr Juleen China   Patient Active Problem List   Diagnosis Date Noted   Port-A-Cath in place 05/28/2022   Nocturia 03/27/2022   Globus sensation 01/10/2022   Dysfunction of left eustachian tube 01/10/2022   Non-small cell cancer of right lung (HCC) 08/23/2021   Pulmonary nodules  06/19/2021   PAD (peripheral artery disease) (HCC) 04/20/2021   Nausea 04/20/2021   Encounter for antineoplastic chemotherapy 03/30/2021   Aortic atherosclerosis (HCC) 08/25/2019   Inguinal hernia 08/25/2019   Chronic periumbilical pain 08/25/2019   Claudication (HCC) 08/24/2018   Neck pain 03/02/2018   Neuralgia 03/02/2018   Prediabetes 08/22/2017   Umbilical hernia 08/22/2017   Varicose veins of both lower extremities 05/22/2016   Vitamin D deficiency 08/21/2015   Hypertension 01/24/2015   Tingling in extremities 01/24/2015   Family history of prostate cancer 08/19/2014   Osteopenia 08/07/2009   Hyperlipidemia 08/12/2007   PITUITARY INSUFFICIENCY 08/05/2007   Testosterone deficiency 08/05/2007   Coronary atherosclerosis 08/05/2007   GERD 08/05/2007   BPH without obstruction/lower urinary tract symptoms 08/05/2007   Home Medication(s) Prior to Admission medications   Medication Sig Start Date End Date Taking? Authorizing Provider  amoxicillin-clavulanate (AUGMENTIN) 875-125 MG tablet Take 1 tablet by mouth 2 (two) times daily for 5 days. 06/20/22 06/25/22  Shanon Ace, PA-C  aspirin EC 81 MG tablet Take 1 tablet (81 mg total) by mouth at bedtime. Okay to restart this medication on 07/03/2021 07/02/21   Leslye Peer, MD  azithromycin (ZITHROMAX) 250 MG tablet Take 2 tablets (500 mg total) by mouth daily for 1 day, THEN 1 tablet (250 mg total) daily for 4 days. 06/20/22 06/25/22  Shanon Ace, PA-C  Cholecalciferol (VITAMIN D3) 2000 UNITS TABS Take 2,000 Units by mouth  in the morning and at bedtime.    [provider]  Coenzyme Q10 (COQ-10) 100 MG CAPS Take 100 mg by mouth in the morning.    [provider]  Cyanocobalamin (B-12 PO) Take 1 tablet by mouth daily.    [provider]  dexamethasone (DECADRON) 4 MG tablet Take 1 tablet (4 mg total) by mouth daily. 05/16/22   Zadie Rhine, MD  fluconazole (DIFLUCAN) 100 MG tablet Take 1  tablet (100 mg total) by mouth daily. 04/18/22   Antony Blackbird, MD  folic acid (FOLVITE) 1 MG tablet TAKE 1 TABLET BY MOUTH EVERY DAY 04/05/22   Si Gaul, MD  hydrALAZINE (APRESOLINE) 25 MG tablet Take 1 tablet (25 mg total) by mouth 3 (three) times daily. 08/29/21   [name], MD  irbesartan (AVAPRO) 300 MG tablet Take 1 tablet (300 mg total) by mouth daily. 08/29/21   [name], MD  lidocaine (XYLOCAINE) 2 % solution Use as directed 15 mLs in the mouth or throat as needed for mouth pain. Swallow 20 min prior to meals and at bedtime 05/30/22   Erven Colla, PA-C  LORazepam (ATIVAN) 0.5 MG tablet Take 1 tablet (0.5 mg total) by mouth every 8 (eight) hours as needed for anxiety. 05/16/22   Zadie Rhine, MD  nitroGLYCERIN (NITROSTAT) 0.4 MG SL tablet Place 1 tablet (0.4 mg total) under the tongue every 5 (five) minutes x 3 doses as needed for chest pain (if no relief after 2nd dose, proceed to ED or call 911). 02/04/22   Jonelle Sidle, MD  Omega-3 Fatty Acids (OMEGA-3 FISH OIL PO) Take 1,000 mg by mouth in the morning.    [provider]  omeprazole (PRILOSEC) 40 MG capsule Take 1 capsule (40 mg total) by mouth in the morning. 01/10/22   [name], MD  ondansetron (ZOFRAN) 8 MG tablet Take 1 tablet (8 mg total) by mouth every 8 (eight) hours as needed for vomiting or nausea. 05/30/22   Erven Colla, PA-C  Oxycodone HCl 20 MG TABS Take 1 tablet (20 mg total) by mouth 4 (four) times daily as needed. For severe pain 05/30/22   Erven Colla, PA-C  Polyethyl Glycol-Propyl Glycol (LUBRICANT EYE DROPS) 0.4-0.3 % SOLN Place 1-2 drops into both eyes 3 (three) times daily as needed (dry/irritated eyes.).    [provider]  prochlorperazine (COMPAZINE) 10 MG tablet Take 1 tablet (10 mg total) by mouth every 6 (six) hours as needed for nausea or vomiting. 06/04/22   Si Gaul, MD  rosuvastatin (CRESTOR) 20 MG tablet TAKE HALF A TABLET (10 MG) BY MOUTH ONCE DAILY.  10/08/21   [name], MD  sucralfate (CARAFATE) 1 g tablet Take 1 tablet (1 g total) by mouth 4 (four) times daily -  with meals and at bedtime. Crush and dissolve in 10 mL warm water prior to swallowing, take 20 min before meals 05/30/22   Erven Colla, PA-C  tadalafil (CIALIS) 5 MG tablet Take 5 mg by mouth daily as needed for erectile dysfunction.    [provider]  tamsulosin (FLOMAX) 0.4 MG CAPS capsule Take 0.4 mg by mouth in the morning.    [provider]  Testosterone 20.25 MG/ACT (1.62%) GEL Apply 2 Pump topically in the morning. 07/27/19   [provider]  vitamin C (ASCORBIC ACID) 500 MG tablet Take 500 mg by mouth in the morning.    [provider]  Past Surgical History Past Surgical History:  Procedure Laterality Date   Bone spur  2006   5th toe bilaterally   BRONCHIAL BIOPSY  07/02/2021   Procedure: BRONCHIAL BIOPSIES;  Surgeon: Leslye Peer, MD;  Location: [facility] ENDOSCOPY;  Service: Pulmonary;;   BRONCHIAL BRUSHINGS  07/02/2021   Procedure: BRONCHIAL BRUSHINGS;  Surgeon: Leslye Peer, MD;  Location[facility] ENDOSCOPY;  Service: Pulmonary;;   BRONCHIAL NEEDLE ASPIRATION BIOPSY  07/02/2021   Procedure: BRONCHIAL NEEDLE ASPIRATION BIOPSIES;  Surgeon: Leslye Peer, MD;  Location: [facility] ENDOSCOPY;  Service: Pulmonary;;   COLONOSCOPY  2007   negative; Dr Jarold Motto   CORONARY ARTERY BYPASS GRAFT  2001   2 vessels   FIDUCIAL MARKER PLACEMENT  07/02/2021   Procedure: FIDUCIAL MARKER PLACEMENT;  Surgeon: Leslye Peer, MD;  Location: [facility] ENDOSCOPY;  Service: Pulmonary;;   fracture heel  1967   Bil/ due to fall off ladder   Fractured calcaneus  1969   bilaterally w/ ankle fusion   HERNIA REPAIR  1984   right inguinal hernia   KNEE SURGERY Left    LOBECTOMY Right 11/13/2021   MENISECTOMY  2007   R medial, Dr.  Ophelia Charter   PORTA CATH INSERTION     SHOULDER SURGERY  1994   left   UPPER GASTROINTESTINAL ENDOSCOPY  2007   GERD, Dr Jarold Motto   VASECTOMY     VIDEO BRONCHOSCOPY WITH RADIAL ENDOBRONCHIAL ULTRASOUND  07/02/2021   Procedure: VIDEO BRONCHOSCOPY WITH RADIAL ENDOBRONCHIAL ULTRASOUND;  Surgeon: Leslye Peer, MD;  Location: [facility] ENDOSCOPY;  Service: Pulmonary;;   Family History Family History  Problem Relation Age of Onset   Heart attack Father 6   Diabetes Mother    Stroke Mother 66   Hypertension Mother    Leukemia Sister    Colon cancer Paternal Uncle    Aortic aneurysm Brother        abdominal   Heart disease Brother    Atrial fibrillation Brother    Cancer Maternal Uncle         X3:prostate , renal, bone    Parkinson's disease Sister    Stroke Sister    Stroke Sister    Stroke Sister    Atrial fibrillation Sister    Esophageal cancer Neg Hx    Rectal cancer Neg Hx    Stomach cancer Neg Hx     Social History Social History   Tobacco Use   Smoking status: Former    Packs/day: .25    Types: Cigarettes    Quit date: 05/2021    Years since quitting: 1.1   Smokeless tobacco: Never  Vaping Use   Vaping Use: Never used  Substance Use Topics   Alcohol use: No   Drug use: No   Allergies Latex, Bromocriptine, Zestril [lisinopril], Diovan [valsartan], Hydrochlorothiazide, Norvasc [amlodipine], and Zocor [simvastatin]  Review of Systems Review of Systems  Constitutional:  Negative for chills and fever.  HENT:  Negative for facial swelling and trouble swallowing.   Eyes:  Negative for photophobia and visual disturbance.  Respiratory:  Negative for cough and shortness of breath.   Cardiovascular:  Negative for chest pain and palpitations.  Gastrointestinal:  Positive for abdominal pain. Negative for nausea and vomiting.  Endocrine: Negative for polydipsia and polyuria.  Genitourinary:  Negative for difficulty urinating and hematuria.  Musculoskeletal:  Negative for  gait problem and joint swelling.  Skin:  Negative for pallor and rash.  Neurological:  Negative for syncope and headaches.  Psychiatric/Behavioral:  Negative for agitation and confusion.     Physical Exam Vital Signs  I have reviewed the triage vital signs BP (!) 146/75   Pulse 62   Temp 98 F (36.7 C) (Oral)   Resp 19   Ht 6' (1.829 m)   Wt 78 kg Comment: stated per pt  SpO2 96%   BMI 23.33 kg/m  Physical Exam Vitals and nursing note reviewed.  Constitutional:      General: He is not in acute distress.    Appearance: He is well-developed.  HENT:     Head: Normocephalic and atraumatic.     Right Ear: External ear normal.     Left Ear: External ear normal.     Mouth/Throat:     Mouth: Mucous membranes are moist.  Eyes:     General: No scleral icterus. Cardiovascular:     Rate and Rhythm: Normal rate and regular rhythm.     Pulses: Normal pulses.     Heart sounds: Normal heart sounds.  Pulmonary:     Effort: Pulmonary effort is normal. No respiratory distress.     Breath sounds: Normal breath sounds.  Abdominal:     General: Abdomen is flat.     Palpations: Abdomen is soft.     Tenderness: There is abdominal tenderness in the epigastric area. There is no guarding or rebound.  Musculoskeletal:        General: Normal range of motion.     Cervical back: Normal range of motion.     Right lower leg: No edema.     Left lower leg: No edema.  Skin:    General: Skin is warm and dry.     Capillary Refill: Capillary refill takes less than 2 seconds.  Neurological:     Mental Status: He is alert and oriented to person, place, and time.     GCS: GCS eye subscore is 4. GCS verbal subscore is 5. GCS motor subscore is 6.  Psychiatric:        Mood and Affect: Mood normal.        Behavior: Behavior normal.     ED Results and Treatments Labs (all labs ordered are listed, but only abnormal results are displayed) Labs Reviewed  LIPASE, BLOOD - Abnormal; Notable for the  following components:      Result Value   Lipase <10 (*)    All other components within normal limits  COMPREHENSIVE METABOLIC PANEL - Abnormal; Notable for the following components:   Sodium 134 (*)    AST 13 (*)    All other components within normal limits  CBC - Abnormal; Notable for the following components:   WBC 3.8 (*)    RBC 3.36 (*)    Hemoglobin 10.5 (*)    HCT 32.3 (*)    RDW 16.2 (*)    Platelets 140 (*)    All other components within normal limits  URINALYSIS, ROUTINE W REFLEX MICROSCOPIC  Radiology No results found.  Pertinent labs & imaging results that were available during my care of the patient were reviewed by me and considered in my medical decision making (see MDM for details).  Medications Ordered in ED Medications  alum & mag hydroxide-simeth (MAALOX/MYLANTA) 200-200-20 MG/5ML suspension 30 mL (has no administration in time range)    And  lidocaine (XYLOCAINE) 2 % viscous mouth solution 15 mL (has no administration in time range)  sucralfate (CARAFATE) tablet 1 g (has no administration in time range)                                                                                                                                     Procedures Procedures  (including critical care time)  Medical Decision Making / ED Course    Medical Decision Making:    MIR AMBER is a 77 y.o. male Stage IIIa adenocarcinoma of right upper lobe and stage Ia squamous cell carcinoma of right upper lobe, stage Ia adenocarcinoma of left upper lobe, Dr. Arbutus Ped primary oncologist. The complaint involves an extensive differential diagnosis and also carries with it a high risk of complications and morbidity.  Serious etiology was considered. Ddx includes but is not limited to: Differential diagnosis includes but is not exclusive to acute cholecystitis,  intrathoracic causes for epigastric abdominal pain, gastritis, duodenitis, pancreatitis, small bowel or large bowel obstruction, abdominal aortic aneurysm, hernia, gastritis, etc.   Complete initial physical exam performed, notably the patient  in no acute distress, abdomen soft, nonperitoneal.    Reviewed and confirmed nursing documentation for past medical history, family history, social history.  Vital signs reviewed.    Clinical Course as of 06/21/22 2339  Caleen Essex Jun 21, 2022  2309 Hemoglobin(!): 10.5 Similar to baseline [SG]  2325 Lipase(!): <10 [SG]  2325 CTAP from yesterday shows pneumonia, no intra-abdominal abnormalities or signs of infection  [SG]    Clinical Course User Index [SG] Sloan Leiter, DO   Labs reviewed, these are stable.  CT imaging from yesterday reviewed with pneumonia.  He is compliant with his oral antibiotics prescribed by oncology office. Will give GI cocktail, Carafate and reassess Favor symptoms today likely secondary to gastritis, possibly secondary to prior radiation for his lung cancer.  Possibly side effect from immunotherapy.      Additional history obtained: -Additional history obtained from family -External records from outside source obtained and reviewed including: Chart review including previous notes, labs, imaging, consultation notes including CT abdomen pelvis imaging from yesterday with pneumonia, primary care condition, prior labs and imaging, medications   Lab Tests: -I ordered, reviewed, and interpreted labs.   The pertinent results include:   Labs Reviewed  LIPASE, BLOOD - Abnormal; Notable for the following components:      Result Value   Lipase <10 (*)    All other components within normal limits  COMPREHENSIVE METABOLIC PANEL - Abnormal; Notable for the  following components:   Sodium 134 (*)    AST 13 (*)    All other components within normal limits  CBC - Abnormal; Notable for the following components:   WBC 3.8 (*)    RBC  3.36 (*)    Hemoglobin 10.5 (*)    HCT 32.3 (*)    RDW 16.2 (*)    Platelets 140 (*)    All other components within normal limits  URINALYSIS, ROUTINE W REFLEX MICROSCOPIC    Notable for ***  EKG   EKG Interpretation  Date/Time:    Ventricular Rate:    PR Interval:    QRS Duration:   QT Interval:    QTC Calculation:   R Axis:     Text Interpretation:           Imaging Studies ordered: I ordered imaging studies including *** I independently visualized the following imaging with scope of interpretation limited to determining acute life threatening conditions related to emergency care; findings noted above, significant for *** I independently visualized and interpreted imaging. I agree with the radiologist interpretation   Medicines ordered and prescription drug management: Meds ordered this encounter  Medications   AND Linked Order Group    alum & mag hydroxide-simeth (MAALOX/MYLANTA) 200-200-20 MG/5ML suspension 30 mL    lidocaine (XYLOCAINE) 2 % viscous mouth solution 15 mL   sucralfate (CARAFATE) tablet 1 g    -I have reviewed the patients home medicines and have made adjustments as needed   Consultations Obtained: I requested consultation with the ***,  and discussed lab and imaging findings as well as pertinent plan - they recommend: ***   Cardiac Monitoring: The patient was maintained on a cardiac monitor.  I personally viewed and interpreted the cardiac monitored which showed an underlying rhythm of: ***  Social Determinants of Health:  Diagnosis or treatment significantly limited by social determinants of health: {wssoc:28071}   Reevaluation: After the interventions noted above, I reevaluated the patient and found that they have {resolved/improved/worsened:23923::"improved"}  Co morbidities that complicate the patient evaluation  Past Medical History:  Diagnosis Date   Arthritis    CAD (coronary artery disease)    CABG with LIMA to LAD and RIMA  to RCA August 2000 - Dr. Dorris Fetch   Cancer [facility])    basal cell   CHF (congestive heart failure) (HCC)    COPD (chronic obstructive pulmonary disease) (HCC)    Elevated glycated hemoglobin    Essential hypertension    GERD (gastroesophageal reflux disease)    Heart murmur    History of radiation therapy    Right lung(chest) 03/19/22-05/06/22- Dr. Antony Blackbird   Hyperlipidemia    Hypopituitarism (HCC)    Lung cancer (HCC)    Osteopenia    Dr Juleen China   Pancreatitis    Peyronie disease    Testosterone deficiency    Dr Juleen China      Dispostion: Disposition decision including need for hospitalization was considered, and patient {wsdispo:28070::"discharged from emergency department."}    Final Clinical Impression(s) / ED Diagnoses Final diagnoses:  None     This chart was dictated using voice recognition software.  Despite best efforts to proofread,  errors can occur which can change the documentation meaning.

## 2022-06-21 NOTE — ED Triage Notes (Addendum)
Pt presents to ED POV. Pt c/o abd pain x80m. Pt reports that he had CT scan yesterday that showed pneumonia but abd clear. started on antibiotics. Pt reports that he wanted to be checked for pancreatitis d/t hx of.

## 2022-06-21 NOTE — Telephone Encounter (Signed)
This nurse reached out to this patient and made him aware that the provider has reviewed the results of his CT scan.  Made aware of the results mentioned below.  Informed that prescription has been called in to his local [facility]. This nurse made patient aware that we will follow up with this at his scheduled office visit on Wednesday 6/19. Patient acknowledged understanding and has no questions or concerns at this time.  Patient knows to call clinic if has concerns or other symptoms.

## 2022-06-21 NOTE — Telephone Encounter (Signed)
-----   Message from Shanon Ace, PA-C sent at 06/20/2022  9:33 PM EDT ----- Regarding: [facility] results Hi Patrick Harris,  I am going to be out on Friday, but got the results for this patient's CT tonight (Thursday). Would you be able to call and let him know I sent 2 antibiotics to his pharmacy because it shows concern for infection in his right lung. I sent the antibiotics because he has been having intermittent fevers and a productive cough. No signs of infection in his abdomen. He can follow up with Dr. Arbutus Ped when he sees him next week.   Fleet Contras will be in infusion Friday so won't be able to call him and it says Diane is off.  I appreciate your help! Call my cell if you have any questions!  -Jae Dire

## 2022-06-22 MED ORDER — MAALOX MAX 400-400-40 MG/5ML PO SUSP: 15.0000 mL | Freq: Four times a day (QID) | ORAL | 0 refills | Status: DC | PRN

## 2022-06-22 NOTE — Discharge Instructions (Addendum)
It was a pleasure caring for you today in the emergency department.  Please continue antibiotics that are prescribed to you for your pneumonia.  Please continue Carafate and omeprazole to help with your stomach discomfort.  You may take Maalox as needed.  Please follow-up with gastroenterology on Monday  Please return to the emergency department for any worsening or worrisome symptoms.

## 2022-06-24 ENCOUNTER — Other Ambulatory Visit: Payer: Self-pay

## 2022-06-24 ENCOUNTER — Ambulatory Visit: Payer: Medicare Other | Admitting: Gastroenterology

## 2022-06-24 ENCOUNTER — Inpatient Hospital Stay: Payer: Medicare Other

## 2022-06-24 ENCOUNTER — Encounter: Payer: Self-pay | Admitting: Gastroenterology

## 2022-06-24 VITALS — BP 90/60 | HR 98 | Ht 72.0 in | Wt 171.0 lb

## 2022-06-24 VITALS — BP 125/64 | HR 77 | Temp 97.6°F | Resp 16 | Wt 171.7 lb

## 2022-06-24 DIAGNOSIS — C771 Secondary and unspecified malignant neoplasm of intrathoracic lymph nodes: Secondary | ICD-10-CM | POA: Diagnosis not present

## 2022-06-24 DIAGNOSIS — E860 Dehydration: Secondary | ICD-10-CM

## 2022-06-24 DIAGNOSIS — R1033 Periumbilical pain: Secondary | ICD-10-CM

## 2022-06-24 DIAGNOSIS — R112 Nausea with vomiting, unspecified: Secondary | ICD-10-CM | POA: Diagnosis not present

## 2022-06-24 DIAGNOSIS — K5730 Diverticulosis of large intestine without perforation or abscess without bleeding: Secondary | ICD-10-CM | POA: Diagnosis not present

## 2022-06-24 DIAGNOSIS — C3412 Malignant neoplasm of upper lobe, left bronchus or lung: Secondary | ICD-10-CM | POA: Diagnosis not present

## 2022-06-24 DIAGNOSIS — R1084 Generalized abdominal pain: Secondary | ICD-10-CM | POA: Diagnosis not present

## 2022-06-24 DIAGNOSIS — I700 Atherosclerosis of aorta: Secondary | ICD-10-CM | POA: Diagnosis not present

## 2022-06-24 DIAGNOSIS — D649 Anemia, unspecified: Secondary | ICD-10-CM | POA: Diagnosis not present

## 2022-06-24 DIAGNOSIS — Z923 Personal history of irradiation: Secondary | ICD-10-CM | POA: Diagnosis not present

## 2022-06-24 DIAGNOSIS — K5900 Constipation, unspecified: Secondary | ICD-10-CM | POA: Diagnosis not present

## 2022-06-24 DIAGNOSIS — Z95828 Presence of other vascular implants and grafts: Secondary | ICD-10-CM

## 2022-06-24 DIAGNOSIS — Z79899 Other long term (current) drug therapy: Secondary | ICD-10-CM | POA: Diagnosis not present

## 2022-06-24 DIAGNOSIS — Z806 Family history of leukemia: Secondary | ICD-10-CM | POA: Diagnosis not present

## 2022-06-24 DIAGNOSIS — Z87891 Personal history of nicotine dependence: Secondary | ICD-10-CM | POA: Diagnosis not present

## 2022-06-24 DIAGNOSIS — K209 Esophagitis, unspecified without bleeding: Secondary | ICD-10-CM | POA: Diagnosis not present

## 2022-06-24 DIAGNOSIS — Z800 Family history of malignant neoplasm of digestive organs: Secondary | ICD-10-CM | POA: Diagnosis not present

## 2022-06-24 DIAGNOSIS — C3411 Malignant neoplasm of upper lobe, right bronchus or lung: Secondary | ICD-10-CM | POA: Diagnosis not present

## 2022-06-24 DIAGNOSIS — G8929 Other chronic pain: Secondary | ICD-10-CM

## 2022-06-24 MED ORDER — TRAMADOL HCL 50 MG PO TABS: 50.0000 mg | ORAL_TABLET | Freq: Four times a day (QID) | ORAL | 0 refills | Status: DC | PRN

## 2022-06-24 MED ORDER — HEPARIN SOD (PORK) LOCK FLUSH 100 UNIT/ML IV SOLN
500.0000 [IU] | Freq: Once | INTRAVENOUS | Status: AC
  Administered 2022-06-24: 500 [IU]

## 2022-06-24 MED ORDER — SODIUM CHLORIDE 0.9 % IV SOLN: Freq: Once | INTRAVENOUS | Status: AC

## 2022-06-24 MED ORDER — SODIUM CHLORIDE 0.9% FLUSH
10.0000 mL | Freq: Once | INTRAVENOUS | Status: AC
  Administered 2022-06-24: 10 mL

## 2022-06-24 NOTE — Patient Instructions (Addendum)
We have sent the following medications to your pharmacy for you to pick up at your convenience: Tramadol.  We have scheduled you an appt today with the cancer center at 11:00 am. Please check in at the front desk.   You have been scheduled for an endoscopy. Please follow written instructions given to you at your visit today. If you use inhalers (even only as needed), please bring them with you on the day of your procedure.  The [facility] providers would like to encourage you to use [facility] to communicate with providers for non-urgent requests or questions.  Due to long hold times on the telephone, sending your provider a message by [employer] [facility] [employer] may be a faster and more efficient way to get a response.  Please allow 48 business hours for a response.  Please remember that this is for non-urgent requests.   Due to recent changes in healthcare laws, you may see the results of your imaging and laboratory studies on MyChart before your provider has had a chance to review them.  We understand that in some cases there may be results that are confusing or concerning to you. Not all laboratory results come back in the same time frame and the provider may be waiting for multiple results in order to interpret others.  Please give [place] 48 hours in order for your provider to thoroughly review all the results before contacting the office for clarification of your results.   Thank you for choosing me and [facility].  Venita Lick. Pleas Koch., MD., Clementeen Graham

## 2022-06-24 NOTE — Progress Notes (Addendum)
Assessment     Constant periumbilical abdominal pain - suspected Imfinzi side effect. R/O ulcer, gastritis Dysphagia, odynophagia - R/O esophagitis d/t radiation, Candida, other infection. R/O stricture Suspected dehydration with initial BP 90/50, repeat BP 90/60 GERD Personal history of adenomatous colon polyps Pneumonia on Augmentin and Zithromax Non-small cell lung cancer, post chemotherapy, radiation and currently on Imfinzi Anemia   Recommendations    Urgent appointment with oncology clinic for IV fluids today Continue omeprazole 40 mg po bid, sucralfate 1 g crushed and dissolved in water po qid and ondansetron 8 mg po tid prn Tramadol 50 mg q6h prn pain, #20, no refills.  Further pain management per oncology Schedule EGD. The risks (including bleeding, perforation, infection, missed lesions, medication reactions and possible hospitalization or surgery if complications occur), benefits, and alternatives to endoscopy with possible biopsy and possible dilation were discussed with the patient and they consent to proceed.   Follow up scheduled with Dr. Arbutus Ped on June 19   HPI    This is a 77 year old male with periumbilical abdominal pain.  He relates worsening problems with constant periumbilical abdominal pain since beginning Imfinzi (durvalumab) for non-small cell lung cancer.  He recently completed a course of radiation therapy.  He relates mild pain with swallowing, mild difficulty swallowing and intermittent nausea.  His main complaint is ongoing periumbilical abdominal pain that he relates to the start of Imfinzi.  He was evaluated in the ED on Friday, June 14 with blood work and CT AP negative for GI findings.  He had transient improvement in symptoms with a GI cocktail.  He relates 1 day of diarrhea on Saturday.  His general bowel pattern has been mildly constipated over the past several weeks.  He relates that weaned himself off oxycodone and has not taken any medications for  pain recently.   EGD Mar 2021  - Normal esophagus.  - Erythematous mucosa in the gastric body and antrum. Biopsied.  - Small hiatal hernia.  - Normal duodenal bulb and second portion of the duodenum.   Labs / Imaging       Latest Ref Rng & Units 06/21/2022    7:46 PM 06/19/2022    1:27 PM 06/13/2022   12:05 PM  Hepatic Function  Total Protein 6.5 - 8.1 g/dL 6.8  6.9  6.6   Albumin 3.5 - 5.0 g/dL 3.6  3.1  3.4   AST 15 - 41 U/L 13  13  14    ALT 0 - 44 U/L 12  13  18    Alk Phosphatase 38 - 126 U/L 56  54  56   Total Bilirubin 0.3 - 1.2 mg/dL 0.4  0.3  0.4        Latest Ref Rng & Units 06/21/2022    7:46 PM 06/19/2022    1:27 PM 06/13/2022   12:05 PM  CBC  WBC 4.0 - 10.5 K/uL 3.8  3.7  4.4   Hemoglobin 13.0 - 17.0 g/dL 40.9  81.1  9.9   Hematocrit 39.0 - 52.0 % 32.3  31.5  30.6   Platelets 150 - 400 K/uL 140  127  126      CT Abdomen Pelvis W Contrast CLINICAL DATA:  Upper abdominal pain and tenderness on exam. Nausea and vomiting. Undergoing immunotherapy for lung carcinoma. * Tracking Code: BO *  EXAM: CT ABDOMEN AND PELVIS WITH CONTRAST  TECHNIQUE: Multidetector CT imaging of the abdomen and pelvis was performed using the standard protocol following bolus  administration of intravenous contrast.  RADIATION DOSE REDUCTION: This exam was performed according to the departmental dose-optimization program which includes automated exposure control, adjustment of the mA and/or kV according to patient size and/or use of iterative reconstruction technique.  CONTRAST:  OMNIPAQUE IOHEXOL 300 MG/ML  SOLN  COMPARISON:  05/05/2019  FINDINGS: Lower Chest: Bibasilar paraseptal emphysema. Focal airspace opacity seen in the medial right lower lobe, suspicious for infectious or inflammatory process.  Hepatobiliary: No hepatic masses identified. Gallbladder is unremarkable. No evidence of biliary ductal dilatation.  Pancreas:  No mass or inflammatory changes.  Spleen:  Within normal limits in size and appearance.  Adrenals/Urinary Tract: No suspicious masses identified. No evidence of ureteral calculi or hydronephrosis.  Stomach/Bowel: No evidence of obstruction, inflammatory process or abnormal fluid collections. Normal appendix visualized. Mild colonic diverticulosis noted, without evidence of diverticulitis.  Vascular/Lymphatic: No pathologically enlarged lymph nodes. No acute vascular findings. Aortic atherosclerotic calcification incidentally noted.  Reproductive:  Stable moderately enlarged prostate gland.  Other:  None.  Musculoskeletal:  No suspicious bone lesions identified.  IMPRESSION: No acute findings within the abdomen or pelvis.  No evidence of abdominal or pelvic metastatic disease.  Focal airspace opacity in medial right lower lobe, suspicious for infectious or inflammatory process. Recommend clinical correlation, and consider chest radiograph or short-term follow-up by chest CT.  Stable moderately enlarged prostate.  Aortic Atherosclerosis (ICD10-I70.0) and Emphysema (ICD10-J43.9).  Electronically Signed   By: Danae Orleans M.D.   On: 06/20/2022 14:44   Current Medications, Allergies, Past Medical History, Past Surgical History, Family History and Social History were reviewed in [employer] record.   Physical Exam: General: Well developed, well nourished, no acute distress Head: Normocephalic and atraumatic Eyes: Sclerae anicteric, EOMI Ears: Normal auditory acuity Mouth: No deformities or lesions noted Lungs: Clear throughout to auscultation Heart: Regular rate and rhythm; No murmurs, rubs or bruits Abdomen: Soft, mild periumbilical tenderness and non distended. No masses, hepatosplenomegaly or hernias noted. Normal Bowel sounds Rectal: Not done Musculoskeletal: Symmetrical with no gross deformities  Pulses:  Normal pulses noted Extremities: No edema or deformities noted Neurological:  Alert oriented x 4, grossly nonfocal Psychological:  Alert and cooperative. Normal mood and affect   Krissie Merrick T. Russella Dar, MD 06/24/2022, 9:14 AM

## 2022-06-24 NOTE — Patient Instructions (Signed)

## 2022-06-25 NOTE — Progress Notes (Signed)
Radiation Oncology         [phone] ________________________________  Name: Patrick Harris MRN: [id number]  Date: 06/26/2022  DOB: 1945-07-13  Follow-Up Visit Note  CC: [name], MD  [name],*  No diagnosis found.  Diagnosis: The primary encounter diagnosis was Malignant neoplasm of right upper lobe of lung (HCC). A diagnosis of Non-small cell cancer of right lung [facility]) was also pertinent to this visit.   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 He was last seen here for follow-up on 05/30/22. To review from his last visit, the patient endorsed persistent esophagitis from radiation. He also reported nausea which was thought to be attributed to his pain medication. He was advised to reduce his pain medication as tolerated with the aim of decreasing his nausea.   In the interval since his last visit, the patient presented to the ED on multiple occasions; detailed as follows:  -- ED 06/02/22: Patient presented with a fever of 101 which he was managing at home with tylenol. Given unremarkable ED work-up (including a chest x-ray) and that he denied any other symptoms, he was advised to continue with tylenol and follow up with his PCP.  -- ED 06/06/22: Patient returned to the ED with c/o recurrent daily fevers. Work-up was again unremarkable and his symptoms were ultimately  attributed to immunotherapy.  -- ED 06/21/22: Patient presented with c/o epigastric discomfort x 1 month, worse with P.O intake. He was given a GI cocktail with carafate with improvement in his symptoms. His symptoms were favored as likely secondary to gastritis, vs  secondary to radiation therapy and or immunotherapy.  The patient also endorsed abdominal pain during his most recent follow-up visit with medical oncology on 06/19/22. He has apparently had this since starting immunotherapy. He also endorsed constipation, urinary frequency, and weakness/fatigue, and feeling more tired (possibly related to lack of sleep secondary to urinary frequency).    A CT AP with contrast was ordered for further evaluation of his abdominal pain on 06/20/22 which showed no acute findings or evidence of metastatic disease in the abdomen or pelvis. CT did demonstrate a focal airspace opacity in medial right lower lobe, suspicious for infectious or inflammatory process.   Based on CT findings showing concern for pneumonia, Dr. Arbutus Ped prescribed his a course of abx.   In most recent history, the patient met with GI on 06/24/22 for further management of his abdominal pain and persistent dysphagia and odynophagia. He has been scheduled for an EGD per GI for further evaluation. He was also noted to be dehydrated during this visit and sent to oncology for urgent IV fluids.   ***  Allergies:  is allergic to latex, bromocriptine, zestril [lisinopril], diovan [valsartan], hydrochlorothiazide, norvasc [amlodipine], and zocor [simvastatin].  Meds: Current Outpatient Medications  Medication Sig Dispense Refill   alum & mag hydroxide-simeth (MAALOX MAX) 400-400-40 MG/5ML suspension Take 15 mLs by mouth  every 6 (six) hours as needed for indigestion. 355 mL 0   amoxicillin-clavulanate (AUGMENTIN) 875-125 MG tablet Take 1 tablet by mouth 2 (two) times daily for 5 days. 10 tablet 0   aspirin EC 81 MG tablet Take 1 tablet (81 mg  total) by mouth at bedtime. Okay to restart this medication on 07/03/2021 30 tablet 11   azithromycin (ZITHROMAX) 250 MG tablet Take 2 tablets (500 mg total) by mouth daily for 1 day, THEN 1 tablet (250 mg total) daily for 4 days. 6 tablet 0   Cholecalciferol (VITAMIN D3) 2000 UNITS TABS Take 2,000 Units by mouth in the morning and at bedtime.     Coenzyme Q10 (COQ-10) 100 MG CAPS Take 100 mg by mouth in the morning.     Cyanocobalamin (B-12 PO) Take 1 tablet by mouth daily.     durvalumab (IMFINZI) 120 MG/2.4ML SOLN injection Inject into the vein.     folic acid (FOLVITE) 1 MG tablet TAKE 1 TABLET BY MOUTH EVERY DAY 90 tablet 1   hydrALAZINE (APRESOLINE) 25 MG tablet Take 1 tablet (25 mg total) by mouth 3 (three) times daily. 270 tablet 3   irbesartan (AVAPRO) 300 MG tablet Take 1 tablet (300 mg total) by mouth daily. 90 tablet 3   LORazepam (ATIVAN) 0.5 MG tablet Take 1 tablet (0.5 mg total) by mouth every 8 (eight) hours as needed for anxiety. 10 tablet 0   nitroGLYCERIN (NITROSTAT) 0.4 MG SL tablet Place 1 tablet (0.4 mg total) under the tongue every 5 (five) minutes x 3 doses as needed for chest pain (if no relief after 2nd dose, proceed to ED or call 911). 25 tablet 3   Omega-3 Fatty Acids (OMEGA-3 FISH OIL PO) Take 1,000 mg by mouth in the morning.     omeprazole (PRILOSEC) 40 MG capsule Take 1 capsule (40 mg total) by mouth in the morning.     ondansetron (ZOFRAN) 8 MG tablet Take 1 tablet (8 mg total) by mouth every 8 (eight) hours as needed for vomiting or nausea. 20 tablet 2   Polyethyl Glycol-Propyl Glycol (LUBRICANT EYE DROPS) 0.4-0.3 % SOLN Place 1-2 drops into both eyes 3 (three) times daily as needed (dry/irritated eyes.).     prochlorperazine (COMPAZINE) 10 MG tablet Take 1 tablet (10 mg total) by mouth every 6 (six) hours as needed for nausea or vomiting. 20 tablet 0   rosuvastatin (CRESTOR) 20 MG tablet TAKE HALF A TABLET (10 MG) BY MOUTH ONCE DAILY. 45 tablet 3   sucralfate  (CARAFATE) 1 g tablet Take 1 tablet (1 g total) by mouth 4 (four) times daily -  with meals and at bedtime. Crush and dissolve in 10 mL warm water prior to swallowing, take 20 min before meals 120 tablet 1   tadalafil (CIALIS) 5 MG tablet Take 5 mg by mouth daily as needed for erectile dysfunction.     tamsulosin (FLOMAX) 0.4 MG CAPS capsule Take 0.4 mg by mouth in the morning.     Testosterone 20.25 MG/ACT (1.62%) GEL Apply 2 Pump topically in the morning.     traMADol (ULTRAM) 50 MG tablet Take 1 tablet (50 mg total) by mouth every 6 (six) hours as needed. 20 tablet 0   vitamin C (ASCORBIC ACID) 500 MG tablet Take 500 mg by mouth in the morning.     No current facility-administered medications for this encounter.    Physical Findings: The patient is in no acute distress. Patient is alert and oriented.  vitals were not taken for this visit. .  No significant changes. Lungs are clear to auscultation bilaterally. Heart has regular rate and rhythm. No palpable cervical, supraclavicular, or axillary adenopathy. Abdomen soft, non-tender, normal bowel sounds.   Lab Findings: Lab Results  Component Value Date   WBC 3.8 (L) 06/21/2022   HGB 10.5 (L) 06/21/2022   HCT 32.3 (L) 06/21/2022   MCV 96.1 06/21/2022   PLT 140 (L) 06/21/2022    Radiographic Findings: CT Abdomen Pelvis W Contrast  Result Date: 06/20/2022 CLINICAL DATA:  Upper abdominal pain and tenderness on exam. Nausea and vomiting. Undergoing immunotherapy for lung carcinoma. * Tracking Code: BO * EXAM: CT ABDOMEN AND PELVIS WITH CONTRAST TECHNIQUE: Multidetector CT imaging of the abdomen and pelvis was performed using the standard protocol following bolus administration of intravenous contrast. RADIATION DOSE REDUCTION: This exam was performed according to the departmental dose-optimization program which includes automated exposure control, adjustment of the mA and/or kV according to patient size and/or use of iterative reconstruction  technique. CONTRAST:  OMNIPAQUE IOHEXOL 300 MG/ML  SOLN COMPARISON:  05/05/2019 FINDINGS: Lower Chest: Bibasilar paraseptal emphysema. Focal airspace opacity seen in the medial right lower lobe, suspicious for infectious or inflammatory process. Hepatobiliary: No hepatic masses identified. Gallbladder is unremarkable. No evidence of biliary ductal dilatation. Pancreas:  No mass or inflammatory changes. Spleen: Within normal limits in size and appearance. Adrenals/Urinary Tract: No suspicious masses identified. No evidence of ureteral calculi or hydronephrosis. Stomach/Bowel: No evidence of obstruction, inflammatory process or abnormal fluid collections. Normal appendix visualized. Mild colonic diverticulosis noted, without evidence of diverticulitis. Vascular/Lymphatic: No pathologically enlarged lymph nodes. No acute vascular findings. Aortic atherosclerotic calcification incidentally noted. Reproductive:  Stable moderately enlarged prostate gland. Other:  None. Musculoskeletal:  No suspicious bone lesions identified. IMPRESSION: No acute findings within the abdomen or pelvis. No evidence of abdominal or pelvic metastatic disease. Focal airspace opacity in medial right lower lobe, suspicious for infectious or inflammatory process. Recommend clinical correlation, and consider chest radiograph or short-term follow-up by chest CT. Stable moderately enlarged prostate. Aortic Atherosclerosis (ICD10-I70.0) and Emphysema (ICD10-J43.9). Electronically Signed   By: Danae Orleans M.D.   On: 06/20/2022 14:44   DG Chest 2 View  Result Date: 06/02/2022 CLINICAL DATA:  Suspected sepsis. EXAM: CHEST - 2 VIEW COMPARISON:  02/20/2022. FINDINGS: The heart size and mediastinal contours are within normal limits. There is atherosclerotic calcification of the aorta. Reduced lung volume is noted on the right with mild atelectasis or scarring at the right lung base. The left lung is clear. No effusion or pneumothorax.  Degenerative changes are present in the thoracic spine. Sternotomy wires are noted. A right chest port terminates over the superior vena cava. IMPRESSION: Stable chest with no acute process. Electronically Signed   By: Thornell Sartorius M.D.   On: 06/02/2022 21:22    Impression: The primary encounter diagnosis was Malignant neoplasm of right upper lobe of lung (HCC). A diagnosis of Non-small cell cancer of right lung [facility]) was also pertinent to this visit.   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  ***  Plan:  ***   *** minutes of total time was spent for this patient encounter,  including preparation, face-to-face counseling with the patient and coordination of care, physical exam, and documentation of the encounter. ____________________________________  Blair Promise, PhD, MD  This document serves as a record of services personally performed by Gery Pray, MD. It was created on his behalf by Roney Mans, a trained medical scribe. The creation of this record is based on the scribe's personal observations and the provider's statements to them. This document has been checked and approved by the attending provider.

## 2022-06-26 ENCOUNTER — Encounter: Payer: Self-pay | Admitting: Radiation Oncology

## 2022-06-26 ENCOUNTER — Inpatient Hospital Stay: Payer: Medicare Other

## 2022-06-26 ENCOUNTER — Ambulatory Visit
Admission: RE | Admit: 2022-06-26 | Discharge: 2022-06-26 | Disposition: A | Discharge status: Home / Self Care | Payer: Medicare Other | Source: Ambulatory Visit | Attending: Radiation Oncology | Admitting: Radiation Oncology

## 2022-06-26 ENCOUNTER — Inpatient Hospital Stay: Payer: Medicare Other | Admitting: Internal Medicine

## 2022-06-26 ENCOUNTER — Encounter: Payer: Self-pay | Admitting: General Practice

## 2022-06-26 ENCOUNTER — Other Ambulatory Visit: Payer: Self-pay

## 2022-06-26 VITALS — BP 120/68 | HR 82 | Temp 97.7°F | Resp 17 | Wt 174.5 lb

## 2022-06-26 VITALS — Temp 97.7°F | Resp 20 | Ht 72.0 in | Wt 174.8 lb

## 2022-06-26 DIAGNOSIS — Z806 Family history of leukemia: Secondary | ICD-10-CM | POA: Diagnosis not present

## 2022-06-26 DIAGNOSIS — I700 Atherosclerosis of aorta: Secondary | ICD-10-CM | POA: Insufficient documentation

## 2022-06-26 DIAGNOSIS — E860 Dehydration: Secondary | ICD-10-CM | POA: Insufficient documentation

## 2022-06-26 DIAGNOSIS — Z923 Personal history of irradiation: Secondary | ICD-10-CM | POA: Insufficient documentation

## 2022-06-26 DIAGNOSIS — K209 Esophagitis, unspecified without bleeding: Secondary | ICD-10-CM | POA: Insufficient documentation

## 2022-06-26 DIAGNOSIS — C3412 Malignant neoplasm of upper lobe, left bronchus or lung: Secondary | ICD-10-CM | POA: Insufficient documentation

## 2022-06-26 DIAGNOSIS — Z87891 Personal history of nicotine dependence: Secondary | ICD-10-CM | POA: Diagnosis not present

## 2022-06-26 DIAGNOSIS — C771 Secondary and unspecified malignant neoplasm of intrathoracic lymph nodes: Secondary | ICD-10-CM | POA: Diagnosis not present

## 2022-06-26 DIAGNOSIS — C3491 Malignant neoplasm of unspecified part of right bronchus or lung: Secondary | ICD-10-CM

## 2022-06-26 DIAGNOSIS — Z95828 Presence of other vascular implants and grafts: Secondary | ICD-10-CM

## 2022-06-26 DIAGNOSIS — N400 Enlarged prostate without lower urinary tract symptoms: Secondary | ICD-10-CM | POA: Insufficient documentation

## 2022-06-26 DIAGNOSIS — Z79899 Other long term (current) drug therapy: Secondary | ICD-10-CM | POA: Insufficient documentation

## 2022-06-26 DIAGNOSIS — D649 Anemia, unspecified: Secondary | ICD-10-CM | POA: Diagnosis not present

## 2022-06-26 DIAGNOSIS — C3411 Malignant neoplasm of upper lobe, right bronchus or lung: Secondary | ICD-10-CM | POA: Insufficient documentation

## 2022-06-26 DIAGNOSIS — Z800 Family history of malignant neoplasm of digestive organs: Secondary | ICD-10-CM | POA: Diagnosis not present

## 2022-06-26 DIAGNOSIS — K5730 Diverticulosis of large intestine without perforation or abscess without bleeding: Secondary | ICD-10-CM | POA: Insufficient documentation

## 2022-06-26 DIAGNOSIS — R112 Nausea with vomiting, unspecified: Secondary | ICD-10-CM | POA: Insufficient documentation

## 2022-06-26 DIAGNOSIS — R1084 Generalized abdominal pain: Secondary | ICD-10-CM | POA: Diagnosis not present

## 2022-06-26 DIAGNOSIS — K5900 Constipation, unspecified: Secondary | ICD-10-CM | POA: Diagnosis not present

## 2022-06-26 LAB — CBC WITH DIFFERENTIAL (CANCER CENTER ONLY)
Abs Immature Granulocytes: 0.03 10*3/uL (ref 0.00–0.07)
Basophils Absolute: 0 10*3/uL (ref 0.0–0.1)
Basophils Relative: 1 %
Eosinophils Absolute: 0.1 10*3/uL (ref 0.0–0.5)
Eosinophils Relative: 3 %
HCT: 30.5 % — ABNORMAL LOW (ref 39.0–52.0)
Hemoglobin: 9.9 g/dL — ABNORMAL LOW (ref 13.0–17.0)
Immature Granulocytes: 1 %
Lymphocytes Relative: 17 %
Lymphs Abs: 0.7 10*3/uL (ref 0.7–4.0)
MCH: 32.1 pg (ref 26.0–34.0)
MCHC: 32.5 g/dL (ref 30.0–36.0)
MCV: 99 fL (ref 80.0–100.0)
Monocytes Absolute: 0.6 10*3/uL (ref 0.1–1.0)
Monocytes Relative: 15 %
Neutro Abs: 2.6 10*3/uL (ref 1.7–7.7)
Neutrophils Relative %: 63 %
Platelet Count: 174 10*3/uL (ref 150–400)
RBC: 3.08 MIL/uL — ABNORMAL LOW (ref 4.22–5.81)
RDW: 16.6 % — ABNORMAL HIGH (ref 11.5–15.5)
WBC Count: 4 10*3/uL (ref 4.0–10.5)
nRBC: 0 % (ref 0.0–0.2)

## 2022-06-26 LAB — CMP (CANCER CENTER ONLY)
ALT: 14 U/L (ref 0–44)
AST: 15 U/L (ref 15–41)
Albumin: 3.4 g/dL — ABNORMAL LOW (ref 3.5–5.0)
Alkaline Phosphatase: 59 U/L (ref 38–126)
Anion gap: 4 — ABNORMAL LOW (ref 5–15)
BUN: 20 mg/dL (ref 8–23)
CO2: 29 mmol/L (ref 22–32)
Calcium: 9 mg/dL (ref 8.9–10.3)
Chloride: 102 mmol/L (ref 98–111)
Creatinine: 0.82 mg/dL (ref 0.61–1.24)
GFR, Estimated: >60 mL/min (ref >60)
Glucose, Bld: 106 mg/dL — ABNORMAL HIGH (ref 70–99)
Potassium: 4.3 mmol/L (ref 3.5–5.1)
Sodium: 135 mmol/L (ref 135–145)
Total Bilirubin: 0.3 mg/dL (ref 0.3–1.2)
Total Protein: 6.4 g/dL — ABNORMAL LOW (ref 6.5–8.1)

## 2022-06-26 MED ORDER — SODIUM CHLORIDE 0.9% FLUSH
10.0000 mL | Freq: Once | INTRAVENOUS | Status: AC
  Administered 2022-06-26: 10 mL

## 2022-06-26 MED ORDER — HEPARIN SOD (PORK) LOCK FLUSH 100 UNIT/ML IV SOLN
500.0000 [IU] | Freq: Once | INTRAVENOUS | Status: AC
  Administered 2022-06-26: 500 [IU]

## 2022-06-26 NOTE — Progress Notes (Signed)
Patient c/o side effects from immunotherapy, including nausea, weakness, and pain. He states it's so bad he doesn't want to live anymore and didn't want to continue treatment today. He was kept accessed in case he changed his mind. MD and nurse notified.

## 2022-06-26 NOTE — Progress Notes (Signed)
Patrick Harris is here today for follow up post radiation to the lung.  Lung Side: Right, patient completed treatment on 05/06/22.   Does the patient complain of any of the following: Pain: yes, patient reports having abdominal pain. Patient currently taking tramadol that assist with pain.  Shortness of breath w/wo exertion: No Cough: yes, productive Hemoptysis: No Pain with swallowing: Continues to burn when swallowing.  Swallowing/choking concerns: No Appetite: Fair, patient drinking 2 ensures daily.  Weight:  Wt Readings from Last 3 Encounters:  06/26/22 174 lb 12.8 oz (79.3 kg)  06/26/22 174 lb 8 oz (79.2 kg)  06/24/22 171 lb 11.2 oz (77.9 kg)   Energy Level: Low  Post radiation skin Changes: No    Additional comments if applicable:  Patients infusion was held today due to increased dizziness and fatigue.   Temp 97.7 F (36.5 C)   Resp 20   Ht 6' (1.829 m)   Wt 174 lb 12.8 oz (79.3 kg)   SpO2 98%   BMI 23.71 kg/m

## 2022-06-26 NOTE — Progress Notes (Signed)
[facility] [phone]   Fax:([phone]  OFFICE PROGRESS NOTE  [name], MD [address] [place] [address]  DIAGNOSIS:  Stage IIIA (T1c, N 2, M0) adenocarcinoma in the right upper lobe as well as stage IA (T1c, N0, M0) squamous cell carcinoma involving the right upper lobe and stage IA (T1b, N0, M0) adenocarcinoma involving the left upper lobe diagnosed in June 2023.  PRIOR THERAPY:  1) status post neoadjuvant systemic chemotherapy with carboplatin, paclitaxel and nivolumab for 3 cycles in [facility] followed by right upper lobectomy with lymph node dissection at [facility] under the care of Dr. Glenna Durand with the final pathology showing residual disease with synchronous adenocarcinoma as well as squamous cell carcinoma in the right upper lobe and multiple mediastinal lymph node involvement performed on November 13, 2021. 2)  evidence for disease recurrence with involvement of 4R lymphadenopathy consistent with metastatic adenocarcinoma on February 26, 2022 biopsy. Molecular studies showed no actionable mutations and PD-L1 expression was 15% in the adenocarcinoma in 2% in the squamous cell carcinoma. 3) Concurrent chemotherapy with carboplatin for AUC of 5 and Alimta 500 Mg/M2 every 3 weeks for 2 cycles during the course of radiotherapy.  Last dose was giving 04/08/2022.  CURRENT THERAPY: Consolidation treatment with immunotherapy with Imfinzi 1500 Mg IV every 4 weeks.  First dose May 29, 2022 status post 1 cycle  INTERVAL HISTORY: Patrick Harris 76 y.o. male returns to the clinic today for follow-up visit accompanied by his wife.  The patient continues to complain of abdominal pain mainly in the epigastric area and sometimes radiation to the right side and back.  He was seen at the emergency department few days ago and had CT scan of the abdomen and pelvis that showed no acute findings and no evidence of metastatic disease.  There was  focal airspace opacity in the medial right lower lobe suspicious for infection or inflammation and he was treated with a course of antibiotics.  He continues to have mild nausea with no vomiting or diarrhea but occasional constipation.  He has no headache or visual changes.  He has no current fever or chills.  He thinks that immunotherapy is the cause for his problem and he is not interested in receiving treatment today.  He is scheduled to see Dr. Russella Dar later this week for evaluation of his gastrointestinal issues and may be considered for upper endoscopy.  MEDICAL HISTORY: Past Medical History:  Diagnosis Date   Arthritis    CAD (coronary artery disease)    CABG with LIMA to LAD and RIMA to RCA August 2000 - Dr. Dorris Fetch   Cancer [facility])    basal cell   CHF (congestive heart failure) (HCC)    COPD (chronic obstructive pulmonary disease) (HCC)    Elevated glycated hemoglobin    Essential hypertension    GERD (gastroesophageal reflux disease)    Heart murmur    History of radiation therapy    Right lung(chest) 03/19/22-05/06/22- Dr. Antony Blackbird   Hyperlipidemia    Hypopituitarism (HCC)    Lung cancer (HCC)    Osteopenia    Dr Juleen China   Pancreatitis    Peyronie disease    Testosterone deficiency    Dr Juleen China    ALLERGIES:  is allergic to latex, bromocriptine, zestril [lisinopril], diovan [valsartan], hydrochlorothiazide, norvasc [amlodipine], and zocor [simvastatin].  MEDICATIONS:  Current Outpatient Medications  Medication Sig Dispense Refill   alum & mag hydroxide-simeth (  MAALOX MAX) 400-400-40 MG/5ML suspension Take 15 mLs by mouth every 6 (six) hours as needed for indigestion. 355 mL 0   aspirin EC 81 MG tablet Take 1 tablet (81 mg total) by mouth at bedtime. Okay to restart this medication on 07/03/2021 30 tablet 11   Cholecalciferol (VITAMIN D3) 2000 UNITS TABS Take 2,000 Units by mouth in the morning and at bedtime.     Coenzyme Q10 (COQ-10) 100 MG CAPS Take 100 mg by mouth  in the morning.     Cyanocobalamin (B-12 PO) Take 1 tablet by mouth daily.     durvalumab (IMFINZI) 120 MG/2.4ML SOLN injection Inject into the vein.     folic acid (FOLVITE) 1 MG tablet TAKE 1 TABLET BY MOUTH EVERY DAY 90 tablet 1   hydrALAZINE (APRESOLINE) 25 MG tablet Take 1 tablet (25 mg total) by mouth 3 (three) times daily. 270 tablet 3   irbesartan (AVAPRO) 300 MG tablet Take 1 tablet (300 mg total) by mouth daily. 90 tablet 3   LORazepam (ATIVAN) 0.5 MG tablet Take 1 tablet (0.5 mg total) by mouth every 8 (eight) hours as needed for anxiety. 10 tablet 0   nitroGLYCERIN (NITROSTAT) 0.4 MG SL tablet Place 1 tablet (0.4 mg total) under the tongue every 5 (five) minutes x 3 doses as needed for chest pain (if no relief after 2nd dose, proceed to ED or call 911). 25 tablet 3   Omega-3 Fatty Acids (OMEGA-3 FISH OIL PO) Take 1,000 mg by mouth in the morning.     omeprazole (PRILOSEC) 40 MG capsule Take 1 capsule (40 mg total) by mouth in the morning.     ondansetron (ZOFRAN) 8 MG tablet Take 1 tablet (8 mg total) by mouth every 8 (eight) hours as needed for vomiting or nausea. 20 tablet 2   Polyethyl Glycol-Propyl Glycol (LUBRICANT EYE DROPS) 0.4-0.3 % SOLN Place 1-2 drops into both eyes 3 (three) times daily as needed (dry/irritated eyes.).     prochlorperazine (COMPAZINE) 10 MG tablet Take 1 tablet (10 mg total) by mouth every 6 (six) hours as needed for nausea or vomiting. 20 tablet 0   rosuvastatin (CRESTOR) 20 MG tablet TAKE HALF A TABLET (10 MG) BY MOUTH ONCE DAILY. 45 tablet 3   sucralfate (CARAFATE) 1 g tablet Take 1 tablet (1 g total) by mouth 4 (four) times daily -  with meals and at bedtime. Crush and dissolve in 10 mL warm water prior to swallowing, take 20 min before meals 120 tablet 1   tadalafil (CIALIS) 5 MG tablet Take 5 mg by mouth daily as needed for erectile dysfunction.     tamsulosin (FLOMAX) 0.4 MG CAPS capsule Take 0.4 mg by mouth in the morning.     Testosterone 20.25 MG/ACT  (1.62%) GEL Apply 2 Pump topically in the morning.     traMADol (ULTRAM) 50 MG tablet Take 1 tablet (50 mg total) by mouth every 6 (six) hours as needed. 20 tablet 0   vitamin C (ASCORBIC ACID) 500 MG tablet Take 500 mg by mouth in the morning.     No current facility-administered medications for this visit.    SURGICAL HISTORY:  Past Surgical History:  Procedure Laterality Date   Bone spur  2006   5th toe bilaterally   BRONCHIAL BIOPSY  07/02/2021   Procedure: BRONCHIAL BIOPSIES;  Surgeon: Leslye Peer, MD;  Location[facility] ENDOSCOPY;  Service: Pulmonary;;   BRONCHIAL BRUSHINGS  07/02/2021   Procedure: BRONCHIAL BRUSHINGS;  Surgeon: Delton Coombes,  Les Pou, MD;  Location: [facility] ENDOSCOPY;  Service: Pulmonary;;   BRONCHIAL NEEDLE ASPIRATION BIOPSY  07/02/2021   Procedure: BRONCHIAL NEEDLE ASPIRATION BIOPSIES;  Surgeon: Leslye Peer, MD;  Location: [facility] ENDOSCOPY;  Service: Pulmonary;;   COLONOSCOPY  2007   negative; Dr Jarold Motto   CORONARY ARTERY BYPASS GRAFT  2001   2 vessels   FIDUCIAL MARKER PLACEMENT  07/02/2021   Procedure: FIDUCIAL MARKER PLACEMENT;  Surgeon: Leslye Peer, MD;  Location: [facility] ENDOSCOPY;  Service: Pulmonary;;   fracture heel  1967   Bil/ due to fall off ladder   Fractured calcaneus  1969   bilaterally w/ ankle fusion   HERNIA REPAIR  1984   right inguinal hernia   KNEE SURGERY Left    LOBECTOMY Right 11/13/2021   MENISECTOMY  2007   R medial, Dr. Ophelia Charter   PORTA CATH INSERTION     SHOULDER SURGERY  1994   left   UPPER GASTROINTESTINAL ENDOSCOPY  2007   GERD, Dr Jarold Motto   VASECTOMY     VIDEO BRONCHOSCOPY WITH RADIAL ENDOBRONCHIAL ULTRASOUND  07/02/2021   Procedure: VIDEO BRONCHOSCOPY WITH RADIAL ENDOBRONCHIAL ULTRASOUND;  Surgeon: Leslye Peer, MD;  Location: [facility] ENDOSCOPY;  Service: Pulmonary;;    REVIEW OF SYSTEMS:  Constitutional: positive for fatigue and weight loss Eyes: negative Ears, nose, mouth, throat, and face: negative Respiratory:  negative Cardiovascular: negative Gastrointestinal: positive for abdominal pain, constipation, and nausea Genitourinary:negative Integument/breast: negative Hematologic/lymphatic: negative Musculoskeletal:negative Neurological: negative Behavioral/Psych: negative Endocrine: negative Allergic/Immunologic: negative   PHYSICAL EXAMINATION: General appearance: alert, cooperative, fatigued, and no distress Head: Normocephalic, without obvious abnormality, atraumatic Neck: no adenopathy, no JVD, supple, symmetrical, trachea midline, and thyroid not enlarged, symmetric, no tenderness/mass/nodules Lymph nodes: Cervical, supraclavicular, and axillary nodes normal. Resp: clear to auscultation bilaterally Back: symmetric, no curvature. ROM normal. No CVA tenderness. Cardio: regular rate and rhythm, S1, S2 normal, no murmur, click, rub or gallop GI: soft, non-tender; bowel sounds normal; no masses,  no organomegaly Extremities: extremities normal, atraumatic, no cyanosis or edema Neurologic: Alert and oriented X 3, normal strength and tone. Normal symmetric reflexes. Normal coordination and gait  ECOG PERFORMANCE STATUS: 1 - Symptomatic but completely ambulatory  Blood pressure 120/68, pulse 82, temperature 97.7 F (36.5 C), temperature source Oral, resp. rate 17, weight 174 lb 8 oz (79.2 kg), SpO2 96 %.  LABORATORY DATA: Lab Results  Component Value Date   WBC 3.8 (L) 06/21/2022   HGB 10.5 (L) 06/21/2022   HCT 32.3 (L) 06/21/2022   MCV 96.1 06/21/2022   PLT 140 (L) 06/21/2022      Chemistry      Component Value Date/Time   NA 134 (L) 06/21/2022 1946   K 4.2 06/21/2022 1946   CL 101 06/21/2022 1946   CO2 25 06/21/2022 1946   BUN 16 06/21/2022 1946   CREATININE 0.77 06/21/2022 1946   CREATININE 0.69 06/19/2022 1327   CREATININE 1.07 08/25/2019 0847      Component Value Date/Time   CALCIUM 9.1 06/21/2022 1946   ALKPHOS 56 06/21/2022 1946   AST 13 (L) 06/21/2022 1946   AST 13  (L) 06/19/2022 1327   ALT 12 06/21/2022 1946   ALT 13 06/19/2022 1327   BILITOT 0.4 06/21/2022 1946   BILITOT 0.3 06/19/2022 1327       RADIOGRAPHIC STUDIES: CT Abdomen Pelvis W Contrast  Result Date: 06/20/2022 CLINICAL DATA:  Upper abdominal pain and tenderness on exam. Nausea and vomiting. Undergoing immunotherapy for lung carcinoma. * Tracking  Code: BO * EXAM: CT ABDOMEN AND PELVIS WITH CONTRAST TECHNIQUE: Multidetector CT imaging of the abdomen and pelvis was performed using the standard protocol following bolus administration of intravenous contrast. RADIATION DOSE REDUCTION: This exam was performed according to the departmental dose-optimization program which includes automated exposure control, adjustment of the mA and/or kV according to patient size and/or use of iterative reconstruction technique. CONTRAST:  OMNIPAQUE IOHEXOL 300 MG/ML  SOLN COMPARISON:  05/05/2019 FINDINGS: Lower Chest: Bibasilar paraseptal emphysema. Focal airspace opacity seen in the medial right lower lobe, suspicious for infectious or inflammatory process. Hepatobiliary: No hepatic masses identified. Gallbladder is unremarkable. No evidence of biliary ductal dilatation. Pancreas:  No mass or inflammatory changes. Spleen: Within normal limits in size and appearance. Adrenals/Urinary Tract: No suspicious masses identified. No evidence of ureteral calculi or hydronephrosis. Stomach/Bowel: No evidence of obstruction, inflammatory process or abnormal fluid collections. Normal appendix visualized. Mild colonic diverticulosis noted, without evidence of diverticulitis. Vascular/Lymphatic: No pathologically enlarged lymph nodes. No acute vascular findings. Aortic atherosclerotic calcification incidentally noted. Reproductive:  Stable moderately enlarged prostate gland. Other:  None. Musculoskeletal:  No suspicious bone lesions identified. IMPRESSION: No acute findings within the abdomen or pelvis. No evidence of abdominal or  pelvic metastatic disease. Focal airspace opacity in medial right lower lobe, suspicious for infectious or inflammatory process. Recommend clinical correlation, and consider chest radiograph or short-term follow-up by chest CT. Stable moderately enlarged prostate. Aortic Atherosclerosis (ICD10-I70.0) and Emphysema (ICD10-J43.9). Electronically Signed   By: Danae Orleans M.D.   On: 06/20/2022 14:44   DG Chest 2 View  Result Date: 06/02/2022 CLINICAL DATA:  Suspected sepsis. EXAM: CHEST - 2 VIEW COMPARISON:  02/20/2022. FINDINGS: The heart size and mediastinal contours are within normal limits. There is atherosclerotic calcification of the aorta. Reduced lung volume is noted on the right with mild atelectasis or scarring at the right lung base. The left lung is clear. No effusion or pneumothorax. Degenerative changes are present in the thoracic spine. Sternotomy wires are noted. A right chest port terminates over the superior vena cava. IMPRESSION: Stable chest with no acute process. Electronically Signed   By: Thornell Sartorius M.D.   On: 06/02/2022 21:22    ASSESSMENT AND PLAN: This is a very pleasant 77 years old white male with Stage IIIA (T1c, N 2, M0) adenocarcinoma in the right upper lobe as well as stage IA (T1c, N0, M0) squamous cell carcinoma involving the right upper lobe and stage IA (T1b, N0, M0) adenocarcinoma involving the left upper lobe diagnosed in June 2023. The patient is status post the following treatment: 1) status post neoadjuvant systemic chemotherapy with carboplatin, paclitaxel and nivolumab for 3 cycles in [facility] followed by right upper lobectomy with lymph node dissection at [facility] under the care of Dr. Glenna Durand with the final pathology showing residual disease with synchronous adenocarcinoma as well as squamous cell carcinoma in the right upper lobe and multiple mediastinal lymph node involvement performed on November 13, 2021. 2)  evidence for disease recurrence with  involvement of 4R lymphadenopathy consistent with metastatic adenocarcinoma on February 26, 2022 biopsy. Molecular studies showed no actionable mutations and PD-L1 expression was 15% in the adenocarcinoma in 2% in the squamous cell carcinoma. 3) Concurrent chemotherapy with carboplatin for AUC of 5 and Alimta 500 Mg/M2 every 3 weeks for 2 cycles during the course of radiotherapy.  Last dose was giving 04/08/2022. He tolerated his treatment well except for the chemotherapy-induced odynophagia in addition to fatigue and sore  throat. He started treatment with consolidation immunotherapy with Imfinzi 1500 Mg IV every 4 weeks status post 1 cycle.  The patient has a lot of abdominal pain, nausea and vomiting as well as constipation and bloating recently.  He and his wife are under the impression that this is secondary to his treatment with immunotherapy.  I do not see any clear evidence of colitis or any other evidence for immunotherapy mediated abdominal issues at this point but I will hold his treatment for 2 more weeks until improvement of his condition.  If the patient continues to complain of his abdominal issues, I will discontinue his treatment with immunotherapy completely and monitor him closely with imaging studies. He will come back for follow-up visit in 2 weeks for evaluation before resuming his treatment. For the epigastric pain, he is seen by Dr. Russella Dar and expected to have upper endoscopy in few days. He was advised to call immediately if he has any other concerning symptoms in the interval.  The patient voices understanding of current disease status and treatment options and is in agreement with the current care plan.  All questions were answered. The patient knows to call the clinic with any problems, questions or concerns. We can certainly see the patient much sooner if necessary.  The total time spent in the appointment was 30 minutes.  Disclaimer: This note was dictated with voice  recognition software. Similar sounding words can inadvertently be transcribed and may not be corrected upon review.

## 2022-06-26 NOTE — Progress Notes (Signed)
[facility] Spiritual Care Note  Referred by nursing for additional layer of support. Reached Mr Patrick Harris by phone to introduce [facility] resources and programming, including Spiritual Care and Lung Cancer Support Group. At this time, his top concern is stomach issues: he is grateful that a new medication is giving him some relief, and he is hopeful that his upcoming endoscopy will be informative.  Mr Patrick Harris reports no other needs at this time. He is aware of ongoing chaplain availability, should needs arise or circumstances change.   [address] [place], [place], [facility] Pager [phone] Voicemail [phone]

## 2022-06-27 ENCOUNTER — Ambulatory Visit: Payer: Self-pay | Admitting: Radiation Oncology

## 2022-06-27 ENCOUNTER — Telehealth: Payer: Self-pay

## 2022-06-27 NOTE — Telephone Encounter (Signed)
Transition Care Management Follow-up Telephone Call Date of discharge and from where: [facility] 6/15 How have you been since you were released from the hospital? OK BP is still low Any questions or concerns? No  Items Reviewed: Did the pt receive and understand the discharge instructions provided? Yes  Medications obtained and verified? Yes  Other? No  Any new allergies since your discharge? No  Dietary orders reviewed? No Do you have support at home? Yes     Follow up appointments reviewed:  PCP Hospital f/u appt confirmed? No  Scheduled to see  on  @ . Specialist Hospital f/u appt confirmed? No  Scheduled to see  on  @ . Are transportation arrangements needed? No  If their condition worsens, is the pt aware to call PCP or go to the Emergency Dept.? Yes Was the patient provided with contact information for the PCP's office or ED? Yes Was to pt encouraged to call back with questions or concerns? Yes

## 2022-06-28 ENCOUNTER — Encounter: Payer: Self-pay | Admitting: Gastroenterology

## 2022-06-28 ENCOUNTER — Ambulatory Visit (AMBULATORY_SURGERY_CENTER): Payer: Medicare Other | Admitting: Gastroenterology

## 2022-06-28 ENCOUNTER — Other Ambulatory Visit: Payer: Self-pay

## 2022-06-28 ENCOUNTER — Telehealth: Payer: Self-pay | Admitting: Internal Medicine

## 2022-06-28 VITALS — BP 111/66 | HR 61 | Temp 98.9°F | Resp 12 | Ht 72.0 in | Wt 171.0 lb

## 2022-06-28 DIAGNOSIS — K319 Disease of stomach and duodenum, unspecified: Secondary | ICD-10-CM | POA: Diagnosis not present

## 2022-06-28 DIAGNOSIS — K2210 Ulcer of esophagus without bleeding: Secondary | ICD-10-CM

## 2022-06-28 DIAGNOSIS — K2970 Gastritis, unspecified, without bleeding: Secondary | ICD-10-CM

## 2022-06-28 DIAGNOSIS — R1033 Periumbilical pain: Secondary | ICD-10-CM | POA: Diagnosis not present

## 2022-06-28 DIAGNOSIS — K219 Gastro-esophageal reflux disease without esophagitis: Secondary | ICD-10-CM | POA: Diagnosis not present

## 2022-06-28 DIAGNOSIS — K2100 Gastro-esophageal reflux disease with esophagitis, without bleeding: Secondary | ICD-10-CM | POA: Diagnosis not present

## 2022-06-28 DIAGNOSIS — R1310 Dysphagia, unspecified: Secondary | ICD-10-CM

## 2022-06-28 DIAGNOSIS — G8929 Other chronic pain: Secondary | ICD-10-CM

## 2022-06-28 MED ORDER — LIDOCAINE VISCOUS HCL 2 % MT SOLN: OROMUCOSAL | 0 refills | Status: DC

## 2022-06-28 MED ORDER — SODIUM CHLORIDE 0.9 % IV SOLN
500.0000 mL | Freq: Once | INTRAVENOUS | Status: DC
Start: 2022-06-28 — End: 2022-07-05

## 2022-06-28 MED ORDER — LIDOCAINE VISCOUS HCL 2 % MT SOLN: 15.0000 mL | OROMUCOSAL | 0 refills | Status: DC | PRN

## 2022-06-28 NOTE — Progress Notes (Signed)
See 06/24/2022 H&P, no changes.

## 2022-06-28 NOTE — Progress Notes (Signed)
Called to room to assist during endoscopic procedure.  Patient ID and intended procedure confirmed with present staff. Received instructions for my participation in the procedure from the performing physician.  

## 2022-06-28 NOTE — Progress Notes (Signed)
A and O x3. Report to RN. Tolerated MAC anesthesia well.Teeth unchanged after procedure. 

## 2022-06-28 NOTE — Telephone Encounter (Signed)
Called patient regarding July appointments, left a voicemail. 

## 2022-06-28 NOTE — Patient Instructions (Addendum)
-   Await pathology results. - Soft diet. - Continue present medications. - Viscous lidocaine 2% 10 mL po qid prn   YOU HAD AN ENDOSCOPIC PROCEDURE TODAY AT THE [facility]:   Refer to the procedure report that was given to you for any specific questions about what was found during the examination.  If the procedure report does not answer your questions, please call your gastroenterologist to clarify.  If you requested that your care partner not be given the details of your procedure findings, then the procedure report has been included in a sealed envelope for you to review at your convenience later.  YOU SHOULD EXPECT: Some feelings of bloating in the abdomen. Passage of more gas than usual.  Walking can help get rid of the air that was put into your GI tract during the procedure and reduce the bloating. If you had a lower endoscopy (such as a colonoscopy or flexible sigmoidoscopy) you may notice spotting of blood in your stool or on the toilet paper. If you underwent a bowel prep for your procedure, you may not have a normal bowel movement for a few days.  Please Note:  You might notice some irritation and congestion in your nose or some drainage.  This is from the oxygen used during your procedure.  There is no need for concern and it should clear up in a day or so.  SYMPTOMS TO REPORT IMMEDIATELY:  Following upper endoscopy (EGD)  Vomiting of blood or coffee ground material  New chest pain or pain under the shoulder blades  Painful or persistently difficult swallowing  New shortness of breath  Fever of 100F or higher  Black, tarry-looking stools  For urgent or emergent issues, a gastroenterologist can be reached at any hour by calling [phone]. Do not use MyChart messaging for urgent concerns.    DIET:  We do recommend a small meal at first, but then you may proceed to your regular diet.  Drink plenty of fluids but you should avoid alcoholic beverages for 24  hours.  ACTIVITY:  You should plan to take it easy for the rest of today and you should NOT DRIVE or use heavy machinery until tomorrow (because of the sedation medicines used during the test).    FOLLOW UP: Our staff will call the number listed on your records the next business day following your procedure.  We will call around 7:15- 8:00 am to check on you and address any questions or concerns that you may have regarding the information given to you following your procedure. If we do not reach you, we will leave a message.     If any biopsies were taken you will be contacted by phone or by letter within the next 1-3 weeks.  Please call us at [phone] if you have not heard about the biopsies in 3 weeks.    SIGNATURES/CONFIDENTIALITY: You and/or your care partner have signed paperwork which will be entered into your electronic medical record.  These signatures attest to the fact that that the information above on your After Visit Summary has been reviewed and is understood.  Full responsibility of the confidentiality of this discharge information lies with you and/or your care-partner.

## 2022-06-28 NOTE — Op Note (Signed)
[facility] Patient Name: Patrick Harris Procedure Date: 06/28/2022 8:33 AM MRN: [id number] Endoscopist: Meryl Dare , MD, [phone] Age: 77 Referring MD:  Date of Birth: 1945-08-07 Gender: Male Account #: [id number] Procedure:                Upper GI endoscopy Indications:              Epigastric abdominal pain, Periumbilical abdominal                            pain, Dysphagia, Odynophagia Medicines:                Monitored Anesthesia Care Procedure:                Pre-Anesthesia Assessment:                           - Prior to the procedure, a History and Physical                            was performed, and patient medications and                            allergies were reviewed. The patient's tolerance of                            previous anesthesia was also reviewed. The risks                            and benefits of the procedure and the sedation                            options and risks were discussed with the patient.                            All questions were answered, and informed consent                            was obtained. Prior Anticoagulants: The patient has                            taken no anticoagulant or antiplatelet agents. ASA                            Grade Assessment: III - A patient with severe                            systemic disease. After reviewing the risks and                            benefits, the patient was deemed in satisfactory                            condition to undergo the procedure.  After obtaining informed consent, the endoscope was                            passed under direct vision. Throughout the                            procedure, the patient's blood pressure, pulse, and                            oxygen saturations were monitored continuously. The                            GIF [id number] #[id number] was introduced through the                            mouth, and advanced to  the second part of duodenum.                            The upper GI endoscopy was accomplished without                            difficulty. The patient tolerated the procedure                            well. Scope In: Scope Out: Findings:                 One superficial esophageal ulcer with no bleeding                            and no stigmata of recent bleeding was found 26 cm                            from the incisors. The lesion was 8 mm in largest                            dimension. Biopsies were taken with a cold forceps                            for histology.                           The exam of the esophagus was otherwise normal.                           A single localized small erosion with no bleeding                            and no stigmata of recent bleeding was found on the                            greater curvature of the stomach. Biopsies were  taken with a cold forceps for histology.                           A small hiatal hernia was present.                           The exam of the stomach was otherwise normal.                           The duodenal bulb and second portion of the                            duodenum were normal. Complications:            No immediate complications. Estimated Blood Loss:     Estimated blood loss was minimal. Impression:               - Esophageal ulcer with no bleeding and no stigmata                            of recent bleeding. Biopsied.                           - Erosive gastropathy with no bleeding and no                            stigmata of recent bleeding. Biopsied.                           - Small hiatal hernia.                           - Normal duodenal bulb and second portion of the                            duodenum. Recommendation:           - Patient has a contact number available for                            emergencies. The signs and symptoms of potential                             delayed complications were discussed with the                            patient. Return to normal activities tomorrow.                            Written discharge instructions were provided to the                            patient.                           - Await pathology results.                           -  Soft diet.                           - Continue present medications.                           - Viscous lidocaine 2% 10 mL po qid prn Meryl Dare, MD 06/28/2022 9:00:29 AM This report has been signed electronically.

## 2022-07-01 ENCOUNTER — Telehealth: Payer: Self-pay | Admitting: Gastroenterology

## 2022-07-01 MED ORDER — TRAMADOL HCL 50 MG PO TABS: 50.0000 mg | ORAL_TABLET | Freq: Four times a day (QID) | ORAL | 0 refills | Status: DC | PRN

## 2022-07-01 NOTE — Telephone Encounter (Signed)
OK for 1 refill then will defer ongoing pain mgmt and pain medications to his oncologist.

## 2022-07-01 NOTE — Telephone Encounter (Signed)
Patient is requesting a refill of Tramadol. Please advise Dr. Russella Dar if ok to refill.

## 2022-07-01 NOTE — Telephone Encounter (Signed)
Prescription sent to patient's pharmacy. Informed patient that the prescription was sent and to defer ongoing pain management to oncology. Patient verbalized understanding.

## 2022-07-01 NOTE — Telephone Encounter (Signed)
Inbound call from patient needing medication refill on Tramadol. Please advise.  Thank you

## 2022-07-02 ENCOUNTER — Telehealth: Payer: Self-pay

## 2022-07-02 NOTE — Telephone Encounter (Signed)
  Follow up Call-     06/28/2022    7:43 AM  Call back number  Post procedure Call Back phone  # [phone]  Permission to leave phone message Yes     Patient questions:  Do you have a fever, pain , or abdominal swelling? No. Pain Score  0 *  Have you tolerated food without any problems? Yes.    Have you been able to return to your normal activities? Yes.    Do you have any questions about your discharge instructions: Diet   No. Medications  No. Follow up visit  No.  Do you have questions or concerns about your Care? No.  Actions: * If pain score is 4 or above: No action needed, pain <4.

## 2022-07-04 ENCOUNTER — Encounter: Payer: Self-pay | Admitting: Internal Medicine

## 2022-07-04 ENCOUNTER — Other Ambulatory Visit: Payer: Self-pay | Admitting: Radiology

## 2022-07-04 ENCOUNTER — Telehealth: Payer: Self-pay | Admitting: Medical Oncology

## 2022-07-04 DIAGNOSIS — C3491 Malignant neoplasm of unspecified part of right bronchus or lung: Secondary | ICD-10-CM

## 2022-07-04 MED ORDER — ONDANSETRON HCL 8 MG PO TABS
8.0000 mg | ORAL_TABLET | Freq: Three times a day (TID) | ORAL | 1 refills | Status: DC | PRN
Start: 2022-07-04 — End: 2022-09-02

## 2022-07-04 NOTE — Progress Notes (Signed)
Subjective:    Patient ID: Patrick Harris, male    DOB: 11-21-1945, 77 y.o.   MRN: [id number]      HPI Patrick Harris is here for No chief complaint on file.   On immunotherapy for one month - nausea, not eating - will have to stop it.  He stopped it 1 month ago.  He had PNA and was rx's zpak, augmentin x 10 day -- he does not think it is cleared up. Started abx 6/13.     Tuesday night - had some SOB and bubbling in his chest.  Wednesday he coghed up a lot of discolored mucus and he was better since then.   He keeps getting low grade fever < 100.4.  That is not new.  He is not sure if it is related to the immunotherapy he took for the recent infection.  He was concerned if the pneumonia was cleared or not.  His cough has been much better since Wednesday.   Ct chest - 5/13 - RLL PNA    Stomach pain -he continues to have stomach pain.  He did have an EGD and was found to have an esophageal ulcer and gastritis.  He is taking Carafate and omeprazole 40 mg daily.  He also takes Mylanta.  The stomach pain is difficult and he has no appetite and everything tastes bad because of the immunotherapy he was on.  He is still having significant nausea.  She was concerned the Zofran was causing some of the nausea.  Blood pressure has been on the low side-he did stop taking hydralazine and has not taken irbesartan recently  Medications and allergies reviewed with patient and updated if appropriate.  Current Outpatient Medications on File Prior to Visit  Medication Sig Dispense Refill   alum & mag hydroxide-simeth (MAALOX MAX) 400-400-40 MG/5ML suspension Take 15 mLs by mouth every 6 (six) hours as needed for indigestion. 355 mL 0   aspirin EC 81 MG tablet Take 1 tablet (81 mg total) by mouth at bedtime. Okay to restart this medication on 07/03/2021 30 tablet 11   Cholecalciferol (VITAMIN D3) 2000 UNITS TABS Take 2,000 Units by mouth in the morning and at bedtime.     Coenzyme Q10 (COQ-10) 100 MG CAPS  Take 100 mg by mouth in the morning.     Cyanocobalamin (B-12 PO) Take 1 tablet by mouth daily.     durvalumab (IMFINZI) 120 MG/2.4ML SOLN injection Inject into the vein.     folic acid (FOLVITE) 1 MG tablet TAKE 1 TABLET BY MOUTH EVERY DAY 90 tablet 1   irbesartan (AVAPRO) 300 MG tablet Take 1 tablet (300 mg total) by mouth daily. 90 tablet 3   lidocaine (XYLOCAINE) 2 % solution Use 10 ml four times a day as needed 40 mL 0   LORazepam (ATIVAN) 0.5 MG tablet Take 1 tablet (0.5 mg total) by mouth every 8 (eight) hours as needed for anxiety. 10 tablet 0   nitroGLYCERIN (NITROSTAT) 0.4 MG SL tablet Place 1 tablet (0.4 mg total) under the tongue every 5 (five) minutes x 3 doses as needed for chest pain (if no relief after 2nd dose, proceed to ED or call 911). 25 tablet 3   Omega-3 Fatty Acids (OMEGA-3 FISH OIL PO) Take 1,000 mg by mouth in the morning.     omeprazole (PRILOSEC) 40 MG capsule Take 1 capsule (40 mg total) by mouth in the morning.     ondansetron (ZOFRAN) 8 MG tablet Take  1 tablet (8 mg total) by mouth every 8 (eight) hours as needed for vomiting or nausea. 20 tablet 1   Polyethyl Glycol-Propyl Glycol (LUBRICANT EYE DROPS) 0.4-0.3 % SOLN Place 1-2 drops into both eyes 3 (three) times daily as needed (dry/irritated eyes.).     prochlorperazine (COMPAZINE) 10 MG tablet Take 1 tablet (10 mg total) by mouth every 6 (six) hours as needed for nausea or vomiting. 20 tablet 0   rosuvastatin (CRESTOR) 20 MG tablet TAKE HALF A TABLET (10 MG) BY MOUTH ONCE DAILY. 45 tablet 3   sucralfate (CARAFATE) 1 g tablet Take 1 tablet (1 g total) by mouth 4 (four) times daily -  with meals and at bedtime. Crush and dissolve in 10 mL warm water prior to swallowing, take 20 min before meals 120 tablet 1   tadalafil (CIALIS) 5 MG tablet Take 5 mg by mouth daily as needed for erectile dysfunction.     tamsulosin (FLOMAX) 0.4 MG CAPS capsule Take 0.4 mg by mouth in the morning.     Testosterone 20.25 MG/ACT (1.62%)  GEL Apply 2 Pump topically in the morning.     traMADol (ULTRAM) 50 MG tablet Take 1 tablet (50 mg total) by mouth every 6 (six) hours as needed. 20 tablet 0   vitamin C (ASCORBIC ACID) 500 MG tablet Take 500 mg by mouth in the morning.     No current facility-administered medications on file prior to visit.    Review of Systems  Constitutional:  Positive for appetite change (dec) and fever (low grade 99-100 x 4-5 weeks).  HENT:  Negative for congestion, sinus pain and sore throat.   Respiratory:  Positive for cough (much better since wednesday) and shortness of breath (none since tuesday night). Negative for wheezing.   Gastrointestinal:  Positive for abdominal pain and nausea.  Genitourinary:  Positive for dysuria (Doron Shake when he first urinates - chronic) and frequency (chronic). Negative for hematuria.       Objective:   Vitals:   07/05/22 0910  BP: 122/66  Pulse: 77  Temp: 98 F (36.7 C)  SpO2: 99%   BP Readings from Last 3 Encounters:  07/05/22 122/66  06/28/22 111/66  06/26/22 120/68   Wt Readings from Last 3 Encounters:  07/05/22 176 lb (79.8 kg)  06/28/22 171 lb (77.6 kg)  06/26/22 174 lb 12.8 oz (79.3 kg)   Body mass index is 23.87 kg/m.    Physical Exam Constitutional:      General: He is not in acute distress.    Appearance: Normal appearance. He is not ill-appearing.  HENT:     Head: Normocephalic and atraumatic.  Eyes:     Conjunctiva/sclera: Conjunctivae normal.  Cardiovascular:     Rate and Rhythm: Normal rate and regular rhythm.     Heart sounds: Normal heart sounds.  Pulmonary:     Effort: Pulmonary effort is normal. No respiratory distress.     Breath sounds: Normal breath sounds. No wheezing or rales.  Musculoskeletal:     Right lower leg: No edema.     Left lower leg: No edema.  Skin:    General: Skin is warm and dry.     Findings: No rash.  Neurological:     Mental Status: He is alert. Mental status is at baseline.  Psychiatric:         Mood and Affect: Mood normal.            Assessment & Plan:    See Problem  List for Assessment and Plan of chronic medical problems.

## 2022-07-04 NOTE — Telephone Encounter (Signed)
Dry heaves- he is requesting refill for Zofran. It is the only thing that works so I can function during the day .

## 2022-07-04 NOTE — Telephone Encounter (Signed)
I just refilled his Rx. Could you please call and let the pt know? Thanks

## 2022-07-05 ENCOUNTER — Ambulatory Visit (INDEPENDENT_AMBULATORY_CARE_PROVIDER_SITE_OTHER): Payer: Medicare Other | Admitting: Internal Medicine

## 2022-07-05 ENCOUNTER — Encounter: Payer: Self-pay | Admitting: Internal Medicine

## 2022-07-05 VITALS — BP 122/66 | HR 77 | Temp 98.0°F | Ht 72.0 in | Wt 176.0 lb

## 2022-07-05 DIAGNOSIS — R112 Nausea with vomiting, unspecified: Secondary | ICD-10-CM

## 2022-07-05 DIAGNOSIS — I10 Essential (primary) hypertension: Secondary | ICD-10-CM

## 2022-07-05 DIAGNOSIS — K219 Gastro-esophageal reflux disease without esophagitis: Secondary | ICD-10-CM

## 2022-07-05 DIAGNOSIS — R059 Cough, unspecified: Secondary | ICD-10-CM | POA: Insufficient documentation

## 2022-07-05 DIAGNOSIS — R052 Subacute cough: Secondary | ICD-10-CM

## 2022-07-05 MED ORDER — IRBESARTAN 300 MG PO TABS: 150.0000 mg | ORAL_TABLET | Freq: Every day | ORAL | 3 refills | Status: DC

## 2022-07-05 MED ORDER — PROCHLORPERAZINE MALEATE 10 MG PO TABS
10.0000 mg | ORAL_TABLET | Freq: Four times a day (QID) | ORAL | 0 refills | Status: DC | PRN
Start: 2022-07-05 — End: 2022-09-02

## 2022-07-05 MED ORDER — OMEPRAZOLE 40 MG PO CPDR: DELAYED_RELEASE_CAPSULE | ORAL | Status: DC

## 2022-07-05 NOTE — Patient Instructions (Addendum)
       Medications changes include :   compazine for nausea.  Increase omeprazole 40 mg to twice daily for a month.       Return if symptoms worsen or fail to improve.

## 2022-07-05 NOTE — Assessment & Plan Note (Signed)
Subacute Given recent episode of community-acquired pneumonia which was treated with antibiotics He was concerned that it had not been treated successfully because of his episode of shortness of breath and coughing up discolored mucus earlier this week I believe he likely had a mucous plug which is what caused his shortness of breath He was able to clear that and feels much better I doubt he still has any evidence of pneumonia Symptoms much better the last couple of days Continue symptomatic treatment and monitor He will update me if his symptoms worsen or do not continue to improve

## 2022-07-05 NOTE — Assessment & Plan Note (Signed)
Chronic He has lost weight and blood pressure recently has been on the low side at home He did stop hydralazine which I agree with-will remove from medication list He has not taken it in the Avapro in a couple of days Continue to hold Avapro Monitor BP at home If blood pressure starts to elevate restart Avapro at 150 mg daily and monitor closely-can adjust further if needed

## 2022-07-05 NOTE — Assessment & Plan Note (Signed)
Acute on chronic Recent EGD showed gastritis and an esophageal ulcer Symptoms not ideally controlled-still has a lot of stomach pain Continue Carafate, MiraLAX Increase omeprazole 40 mg twice daily Continue antinausea medication-will prescribe Compazine 10 mg every 6 hours as needed which she has taken before in the past-hopefully this will be better tolerated

## 2022-07-08 DIAGNOSIS — E221 Hyperprolactinemia: Secondary | ICD-10-CM | POA: Diagnosis not present

## 2022-07-08 DIAGNOSIS — E230 Hypopituitarism: Secondary | ICD-10-CM | POA: Diagnosis not present

## 2022-07-08 DIAGNOSIS — D352 Benign neoplasm of pituitary gland: Secondary | ICD-10-CM | POA: Diagnosis not present

## 2022-07-09 ENCOUNTER — Telehealth: Payer: Self-pay | Admitting: Gastroenterology

## 2022-07-09 NOTE — Telephone Encounter (Signed)
Inbound call from patient requesting to speak with nurse regarding test results from his EGD with Dr. Russella Dar on 6/21. Please advise.

## 2022-07-09 NOTE — Telephone Encounter (Signed)
The pt has been advised that the path has not been reviewed and it will be next week when Dr Russella Dar is back in the office before it is read.  The pt has been advised of the information and verbalized understanding.

## 2022-07-10 ENCOUNTER — Other Ambulatory Visit: Payer: Self-pay

## 2022-07-10 ENCOUNTER — Inpatient Hospital Stay: Payer: Medicare Other

## 2022-07-10 ENCOUNTER — Inpatient Hospital Stay: Payer: Medicare Other | Attending: Internal Medicine | Admitting: Internal Medicine

## 2022-07-10 VITALS — BP 145/77 | HR 72 | Temp 97.4°F | Resp 18 | Ht 72.0 in | Wt 175.2 lb

## 2022-07-10 DIAGNOSIS — C3490 Malignant neoplasm of unspecified part of unspecified bronchus or lung: Secondary | ICD-10-CM | POA: Diagnosis not present

## 2022-07-10 DIAGNOSIS — C3412 Malignant neoplasm of upper lobe, left bronchus or lung: Secondary | ICD-10-CM | POA: Insufficient documentation

## 2022-07-10 DIAGNOSIS — Z923 Personal history of irradiation: Secondary | ICD-10-CM | POA: Insufficient documentation

## 2022-07-10 DIAGNOSIS — Z902 Acquired absence of lung [part of]: Secondary | ICD-10-CM | POA: Insufficient documentation

## 2022-07-10 DIAGNOSIS — C3411 Malignant neoplasm of upper lobe, right bronchus or lung: Secondary | ICD-10-CM | POA: Insufficient documentation

## 2022-07-10 DIAGNOSIS — C3491 Malignant neoplasm of unspecified part of right bronchus or lung: Secondary | ICD-10-CM

## 2022-07-10 LAB — CMP (CANCER CENTER ONLY)
ALT: 15 U/L (ref 0–44)
AST: 15 U/L (ref 15–41)
Albumin: 3.3 g/dL — ABNORMAL LOW (ref 3.5–5.0)
Alkaline Phosphatase: 58 U/L (ref 38–126)
Anion gap: 5 (ref 5–15)
BUN: 15 mg/dL (ref 8–23)
CO2: 26 mmol/L (ref 22–32)
Calcium: 9.1 mg/dL (ref 8.9–10.3)
Chloride: 105 mmol/L (ref 98–111)
Creatinine: 0.85 mg/dL (ref 0.61–1.24)
GFR, Estimated: >60 mL/min (ref >60)
Glucose, Bld: 104 mg/dL — ABNORMAL HIGH (ref 70–99)
Potassium: 4.1 mmol/L (ref 3.5–5.1)
Sodium: 136 mmol/L (ref 135–145)
Total Bilirubin: 0.2 mg/dL — ABNORMAL LOW (ref 0.3–1.2)
Total Protein: 6.3 g/dL — ABNORMAL LOW (ref 6.5–8.1)

## 2022-07-10 LAB — CBC WITH DIFFERENTIAL (CANCER CENTER ONLY)
Abs Immature Granulocytes: 0.04 10*3/uL (ref 0.00–0.07)
Basophils Absolute: 0 10*3/uL (ref 0.0–0.1)
Basophils Relative: 1 %
Eosinophils Absolute: 0.1 10*3/uL (ref 0.0–0.5)
Eosinophils Relative: 3 %
HCT: 32.1 % — ABNORMAL LOW (ref 39.0–52.0)
Hemoglobin: 10.4 g/dL — ABNORMAL LOW (ref 13.0–17.0)
Immature Granulocytes: 1 %
Lymphocytes Relative: 18 %
Lymphs Abs: 0.7 10*3/uL (ref 0.7–4.0)
MCH: 31.7 pg (ref 26.0–34.0)
MCHC: 32.4 g/dL (ref 30.0–36.0)
MCV: 97.9 fL (ref 80.0–100.0)
Monocytes Absolute: 0.5 10*3/uL (ref 0.1–1.0)
Monocytes Relative: 14 %
Neutro Abs: 2.5 10*3/uL (ref 1.7–7.7)
Neutrophils Relative %: 63 %
Platelet Count: 149 10*3/uL — ABNORMAL LOW (ref 150–400)
RBC: 3.28 MIL/uL — ABNORMAL LOW (ref 4.22–5.81)
RDW: 15.8 % — ABNORMAL HIGH (ref 11.5–15.5)
WBC Count: 3.9 10*3/uL — ABNORMAL LOW (ref 4.0–10.5)
nRBC: 0 % (ref 0.0–0.2)

## 2022-07-10 NOTE — Progress Notes (Signed)
[facility] [phone]   [phone]  OFFICE PROGRESS NOTE  [name], MD [address] [place] [address]  DIAGNOSIS:  Stage IIIA (T1c, N 2, M0) adenocarcinoma in the right upper lobe as well as stage IA (T1c, N0, M0) squamous cell carcinoma involving the right upper lobe and stage IA (T1b, N0, M0) adenocarcinoma involving the left upper lobe diagnosed in June 2023.  PRIOR THERAPY:  1) status post neoadjuvant systemic chemotherapy with carboplatin, paclitaxel and nivolumab for 3 cycles in [facility] followed by right upper lobectomy with lymph node dissection at [facility] under the care of Dr. Glenna Durand with the final pathology showing residual disease with synchronous adenocarcinoma as well as squamous cell carcinoma in the right upper lobe and multiple mediastinal lymph node involvement performed on November 13, 2021. 2)  evidence for disease recurrence with involvement of 4R lymphadenopathy consistent with metastatic adenocarcinoma on February 26, 2022 biopsy. Molecular studies showed no actionable mutations and PD-L1 expression was 15% in the adenocarcinoma in 2% in the squamous cell carcinoma. 3) Concurrent chemotherapy with carboplatin for AUC of 5 and Alimta 500 Mg/M2 every 3 weeks for 2 cycles during the course of radiotherapy.  Last dose was giving 04/08/2022. 4) Consolidation treatment with immunotherapy with Imfinzi 1500 Mg IV every 4 weeks.  First dose May 29, 2022 status post 1 cycle.  This treatment was discontinued secondary to intolerance  CURRENT THERAPY: Observation  INTERVAL HISTORY: Patrick Harris 77 y.o. male returns to the clinic today for follow-up visit accompanied by his wife.  The patient continues to complain of increasing fatigue and weakness as well as lack of appetite and abdominal pain.  He is followed by Dr. Russella Dar for gastrointestinal workup.  He had upper endoscopy that was unremarkable except for  some gastric ulcers.  He is currently on Protonix 40 mg p.o. twice daily.  He denied having any current chest pain, shortness of breath, cough or hemoptysis.  He continues to have intermittent nausea and currently on Zofran.  He has no vomiting or diarrhea.  The patient is here today for evaluation and discussion about his treatment options.  MEDICAL HISTORY: Past Medical History:  Diagnosis Date   Arthritis    CAD (coronary artery disease)    CABG with LIMA to LAD and RIMA to RCA August 2000 - Dr. Dorris Fetch   Cancer [facility])    basal cell   CHF (congestive heart failure) (HCC)    COPD (chronic obstructive pulmonary disease) (HCC)    Elevated glycated hemoglobin    Essential hypertension    GERD (gastroesophageal reflux disease)    Heart murmur    History of radiation therapy    Right lung(chest) 03/19/22-05/06/22- Dr. Antony Blackbird   Hyperlipidemia    Hypopituitarism (HCC)    Lung cancer (HCC)    Osteopenia    Dr Juleen China   Pancreatitis    Peyronie disease    Testosterone deficiency    Dr Juleen China    ALLERGIES:  is allergic to latex, bromocriptine, zestril [lisinopril], diovan [valsartan], hydrochlorothiazide, norvasc [amlodipine], and zocor [simvastatin].  MEDICATIONS:  Current Outpatient Medications  Medication Sig Dispense Refill   alum & mag hydroxide-simeth (MAALOX MAX) 400-400-40 MG/5ML suspension Take 15 mLs by mouth every 6 (six) hours as needed for indigestion. 355 mL 0   aspirin EC 81 MG tablet Take 1 tablet (81 mg total) by mouth at bedtime. Okay to restart this medication on 07/03/2021  30 tablet 11   Cholecalciferol (VITAMIN D3) 2000 UNITS TABS Take 2,000 Units by mouth in the morning and at bedtime.     Coenzyme Q10 (COQ-10) 100 MG CAPS Take 100 mg by mouth in the morning.     Cyanocobalamin (B-12 PO) Take 1 tablet by mouth daily.     durvalumab (IMFINZI) 120 MG/2.4ML SOLN injection Inject into the vein.     folic acid (FOLVITE) 1 MG tablet TAKE 1 TABLET BY MOUTH EVERY  DAY 90 tablet 1   irbesartan (AVAPRO) 300 MG tablet Take 0.5 tablets (150 mg total) by mouth daily. 90 tablet 3   lidocaine (XYLOCAINE) 2 % solution Use 10 ml four times a day as needed 40 mL 0   LORazepam (ATIVAN) 0.5 MG tablet Take 1 tablet (0.5 mg total) by mouth every 8 (eight) hours as needed for anxiety. 10 tablet 0   nitroGLYCERIN (NITROSTAT) 0.4 MG SL tablet Place 1 tablet (0.4 mg total) under the tongue every 5 (five) minutes x 3 doses as needed for chest pain (if no relief after 2nd dose, proceed to ED or call 911). 25 tablet 3   Omega-3 Fatty Acids (OMEGA-3 FISH OIL PO) Take 1,000 mg by mouth in the morning.     omeprazole (PRILOSEC) 40 MG capsule Take 40 mg bid x 1 month then 40 mg once daily     ondansetron (ZOFRAN) 8 MG tablet Take 1 tablet (8 mg total) by mouth every 8 (eight) hours as needed for vomiting or nausea. 20 tablet 1   Polyethyl Glycol-Propyl Glycol (LUBRICANT EYE DROPS) 0.4-0.3 % SOLN Place 1-2 drops into both eyes 3 (three) times daily as needed (dry/irritated eyes.).     prochlorperazine (COMPAZINE) 10 MG tablet Take 1 tablet (10 mg total) by mouth every 6 (six) hours as needed for nausea or vomiting. 30 tablet 0   rosuvastatin (CRESTOR) 20 MG tablet TAKE HALF A TABLET (10 MG) BY MOUTH ONCE DAILY. 45 tablet 3   sucralfate (CARAFATE) 1 g tablet Take 1 tablet (1 g total) by mouth 4 (four) times daily -  with meals and at bedtime. Crush and dissolve in 10 mL warm water prior to swallowing, take 20 min before meals 120 tablet 1   tadalafil (CIALIS) 5 MG tablet Take 5 mg by mouth daily as needed for erectile dysfunction.     tamsulosin (FLOMAX) 0.4 MG CAPS capsule Take 0.4 mg by mouth in the morning.     Testosterone 20.25 MG/ACT (1.62%) GEL Apply 2 Pump topically in the morning.     traMADol (ULTRAM) 50 MG tablet Take 1 tablet (50 mg total) by mouth every 6 (six) hours as needed. 20 tablet 0   vitamin C (ASCORBIC ACID) 500 MG tablet Take 500 mg by mouth in the morning.      No current facility-administered medications for this visit.    SURGICAL HISTORY:  Past Surgical History:  Procedure Laterality Date   Bone spur  2006   5th toe bilaterally   BRONCHIAL BIOPSY  07/02/2021   Procedure: BRONCHIAL BIOPSIES;  Surgeon: Leslye Peer, MD;  Location: [facility] ENDOSCOPY;  Service: Pulmonary;;   BRONCHIAL BRUSHINGS  07/02/2021   Procedure: BRONCHIAL BRUSHINGS;  Surgeon: Leslye Peer, MD;  Location: [facility] ENDOSCOPY;  Service: Pulmonary;;   BRONCHIAL NEEDLE ASPIRATION BIOPSY  07/02/2021   Procedure: BRONCHIAL NEEDLE ASPIRATION BIOPSIES;  Surgeon: Leslye Peer, MD;  Location: [facility] ENDOSCOPY;  Service: Pulmonary;;   COLONOSCOPY  2007   negative; Dr  Patterson   CORONARY ARTERY BYPASS GRAFT  2001   2 vessels   FIDUCIAL MARKER PLACEMENT  07/02/2021   Procedure: FIDUCIAL MARKER PLACEMENT;  Surgeon: Leslye Peer, MD;  Location: [facility] ENDOSCOPY;  Service: Pulmonary;;   fracture heel  1967   Bil/ due to fall off ladder   Fractured calcaneus  1969   bilaterally w/ ankle fusion   HERNIA REPAIR  1984   right inguinal hernia   KNEE SURGERY Left    LOBECTOMY Right 11/13/2021   MENISECTOMY  2007   R medial, Dr. Ophelia Charter   PORTA CATH INSERTION     SHOULDER SURGERY  1994   left   UPPER GASTROINTESTINAL ENDOSCOPY  2007   GERD, Dr Jarold Motto   VASECTOMY     VIDEO BRONCHOSCOPY WITH RADIAL ENDOBRONCHIAL ULTRASOUND  07/02/2021   Procedure: VIDEO BRONCHOSCOPY WITH RADIAL ENDOBRONCHIAL ULTRASOUND;  Surgeon: Leslye Peer, MD;  Location: [facility] ENDOSCOPY;  Service: Pulmonary;;    REVIEW OF SYSTEMS:  Constitutional: positive for anorexia, fatigue, and weight loss Eyes: negative Ears, nose, mouth, throat, and face: negative Respiratory: negative Cardiovascular: negative Gastrointestinal: positive for abdominal pain, constipation, and nausea Genitourinary:negative Integument/breast: negative Hematologic/lymphatic: negative Musculoskeletal:negative Neurological:  negative Behavioral/Psych: negative Endocrine: negative Allergic/Immunologic: negative   PHYSICAL EXAMINATION: General appearance: alert, cooperative, fatigued, and no distress Head: Normocephalic, without obvious abnormality, atraumatic Neck: no adenopathy, no JVD, supple, symmetrical, trachea midline, and thyroid not enlarged, symmetric, no tenderness/mass/nodules Lymph nodes: Cervical, supraclavicular, and axillary nodes normal. Resp: clear to auscultation bilaterally Back: symmetric, no curvature. ROM normal. No CVA tenderness. Cardio: regular rate and rhythm, S1, S2 normal, no murmur, click, rub or gallop GI: soft, non-tender; bowel sounds normal; no masses,  no organomegaly Extremities: extremities normal, atraumatic, no cyanosis or edema Neurologic: Alert and oriented X 3, normal strength and tone. Normal symmetric reflexes. Normal coordination and gait  ECOG PERFORMANCE STATUS: 1 - Symptomatic but completely ambulatory  Blood pressure (!) 145/77, pulse 72, temperature (!) 97.4 F (36.3 C), temperature source Oral, resp. rate 18, height 6' (1.829 m), weight 175 lb 3.2 oz (79.5 kg), SpO2 100 %.  LABORATORY DATA: Lab Results  Component Value Date   WBC 4.0 06/26/2022   HGB 9.9 (L) 06/26/2022   HCT 30.5 (L) 06/26/2022   MCV 99.0 06/26/2022   PLT 174 06/26/2022      Chemistry      Component Value Date/Time   NA 135 06/26/2022 0804   K 4.3 06/26/2022 0804   CL 102 06/26/2022 0804   CO2 29 06/26/2022 0804   BUN 20 06/26/2022 0804   CREATININE 0.82 06/26/2022 0804   CREATININE 1.07 08/25/2019 0847      Component Value Date/Time   CALCIUM 9.0 06/26/2022 0804   ALKPHOS 59 06/26/2022 0804   AST 15 06/26/2022 0804   ALT 14 06/26/2022 0804   BILITOT 0.3 06/26/2022 0804       RADIOGRAPHIC STUDIES: CT Abdomen Pelvis W Contrast  Result Date: 06/20/2022 CLINICAL DATA:  Upper abdominal pain and tenderness on exam. Nausea and vomiting. Undergoing immunotherapy for lung  carcinoma. * Tracking Code: BO * EXAM: CT ABDOMEN AND PELVIS WITH CONTRAST TECHNIQUE: Multidetector CT imaging of the abdomen and pelvis was performed using the standard protocol following bolus administration of intravenous contrast. RADIATION DOSE REDUCTION: This exam was performed according to the departmental dose-optimization program which includes automated exposure control, adjustment of the mA and/or kV according to patient size and/or use of iterative reconstruction technique. CONTRAST:  OMNIPAQUE  IOHEXOL 300 MG/ML  SOLN COMPARISON:  05/05/2019 FINDINGS: Lower Chest: Bibasilar paraseptal emphysema. Focal airspace opacity seen in the medial right lower lobe, suspicious for infectious or inflammatory process. Hepatobiliary: No hepatic masses identified. Gallbladder is unremarkable. No evidence of biliary ductal dilatation. Pancreas:  No mass or inflammatory changes. Spleen: Within normal limits in size and appearance. Adrenals/Urinary Tract: No suspicious masses identified. No evidence of ureteral calculi or hydronephrosis. Stomach/Bowel: No evidence of obstruction, inflammatory process or abnormal fluid collections. Normal appendix visualized. Mild colonic diverticulosis noted, without evidence of diverticulitis. Vascular/Lymphatic: No pathologically enlarged lymph nodes. No acute vascular findings. Aortic atherosclerotic calcification incidentally noted. Reproductive:  Stable moderately enlarged prostate gland. Other:  None. Musculoskeletal:  No suspicious bone lesions identified. IMPRESSION: No acute findings within the abdomen or pelvis. No evidence of abdominal or pelvic metastatic disease. Focal airspace opacity in medial right lower lobe, suspicious for infectious or inflammatory process. Recommend clinical correlation, and consider chest radiograph or short-term follow-up by chest CT. Stable moderately enlarged prostate. Aortic Atherosclerosis (ICD10-I70.0) and Emphysema (ICD10-J43.9).  Electronically Signed   By: Danae Orleans M.D.   On: 06/20/2022 14:44    ASSESSMENT AND PLAN: This is a very pleasant 77 years old white male with Stage IIIA (T1c, N 2, M0) adenocarcinoma in the right upper lobe as well as stage IA (T1c, N0, M0) squamous cell carcinoma involving the right upper lobe and stage IA (T1b, N0, M0) adenocarcinoma involving the left upper lobe diagnosed in June 2023. The patient is status post the following treatment: 1) status post neoadjuvant systemic chemotherapy with carboplatin, paclitaxel and nivolumab for 3 cycles in [facility] followed by right upper lobectomy with lymph node dissection at [facility] under the care of Dr. Glenna Durand with the final pathology showing residual disease with synchronous adenocarcinoma as well as squamous cell carcinoma in the right upper lobe and multiple mediastinal lymph node involvement performed on November 13, 2021. 2)  evidence for disease recurrence with involvement of 4R lymphadenopathy consistent with metastatic adenocarcinoma on February 26, 2022 biopsy. Molecular studies showed no actionable mutations and PD-L1 expression was 15% in the adenocarcinoma in 2% in the squamous cell carcinoma. 3) Concurrent chemotherapy with carboplatin for AUC of 5 and Alimta 500 Mg/M2 every 3 weeks for 2 cycles during the course of radiotherapy.  Last dose was giving 04/08/2022. He tolerated his treatment well except for the chemotherapy-induced odynophagia in addition to fatigue and sore throat. He started treatment with consolidation immunotherapy with Imfinzi 1500 Mg IV every 4 weeks status post 1 cycle.  He had a lot of gastrointestinal issues after the first cycle of his treatment with increasing bloating as well as lack of appetite and nausea but no significant diarrhea.  He is currently under evaluation by Dr. Russella Dar from gastroenterology.  The patient and his wife are under the impression that these symptoms are resulting from his treatment  with immunotherapy. I recommended for the patient to discontinue his treatment with immunotherapy at this point. I will continue to monitor him closely with repeat CT scan of the chest, abdomen and pelvis in 3 months. For the epigastric pain and other gastrointestinal issues, he is followed by Dr. Russella Dar. The patient was advised to call immediately if he has any other concerning symptoms in the interval.  The patient voices understanding of current disease status and treatment options and is in agreement with the current care plan.  All questions were answered. The patient knows to call the clinic with any  problems, questions or concerns. We can certainly see the patient much sooner if necessary.  The total time spent in the appointment was 30 minutes.  Disclaimer: This note was dictated with voice recognition software. Similar sounding words can inadvertently be transcribed and may not be corrected upon review.

## 2022-07-15 ENCOUNTER — Other Ambulatory Visit: Payer: Self-pay | Admitting: Gastroenterology

## 2022-07-15 ENCOUNTER — Other Ambulatory Visit: Payer: Self-pay

## 2022-07-15 ENCOUNTER — Other Ambulatory Visit: Payer: Medicare Other

## 2022-07-15 DIAGNOSIS — E230 Hypopituitarism: Secondary | ICD-10-CM | POA: Diagnosis not present

## 2022-07-15 DIAGNOSIS — R1033 Periumbilical pain: Secondary | ICD-10-CM | POA: Diagnosis not present

## 2022-07-15 DIAGNOSIS — G8929 Other chronic pain: Secondary | ICD-10-CM

## 2022-07-15 DIAGNOSIS — K2210 Ulcer of esophagus without bleeding: Secondary | ICD-10-CM

## 2022-07-15 DIAGNOSIS — E221 Hyperprolactinemia: Secondary | ICD-10-CM | POA: Diagnosis not present

## 2022-07-15 DIAGNOSIS — D649 Anemia, unspecified: Secondary | ICD-10-CM | POA: Diagnosis not present

## 2022-07-15 DIAGNOSIS — D352 Benign neoplasm of pituitary gland: Secondary | ICD-10-CM | POA: Diagnosis not present

## 2022-07-15 NOTE — Progress Notes (Unsigned)
.  hs

## 2022-07-15 NOTE — Addendum Note (Signed)
Addended by: Jovita Kussmaul L on: 07/15/2022 10:20 AM   Modules accepted: Orders

## 2022-07-17 LAB — HSV DNA BY PCR (REFERENCE LAB)
HSV 2 DNA: NEGATIVE
HSV-1 DNA: NEGATIVE

## 2022-07-22 LAB — HERPES SIMPLEX VIRUS CULTURE

## 2022-07-22 LAB — IGG: IgG (Immunoglobin G), Serum: 1312 mg/dL (ref 600–1540)

## 2022-07-22 LAB — IGM: IgM, Serum: 63 mg/dL (ref 50–300)

## 2022-07-23 ENCOUNTER — Other Ambulatory Visit: Payer: Self-pay

## 2022-07-23 NOTE — Progress Notes (Signed)
Radiation Oncology         [phone] ________________________________  Name: Patrick Harris MRN: [id number]  Date: 07/24/2022  DOB: 01-20-1945  Follow-Up Visit Note  CC: [name], MD  [name],*  No diagnosis found.  Diagnosis:  The primary encounter diagnosis was Malignant neoplasm of right upper lobe of lung (HCC). A diagnosis of Non-small cell cancer of right lung [facility]) was also pertinent to this visit.   Synchronous primaries of adenocarcinoma & squamous cell carcinoma of the RUL, and non-small cell carcinoma favoring adenocarcinoma of LUL: s/p neoadjuvant chemotherapy and right upper lobectomy w/ lymphadenectomy     Recent PET scan with an intensely hypermetabolic precarinal node concerning for metastasis (January 2024)     Interval Since Last Radiation: 2 months and 18 days   Indication for treatment: Curative      Radiation treatment dates: 03/19/22 through 05/06/22 Site/dose: Chest - 60 Gy delivered in 30 Fx at 2 Gy/Fx Beams/energy: 6X, 10X Technique: 3D  Narrative:  The patient returns today for routine follow-up. He was last seen here for follow-up on 06/26/22.     Since his last visit, the patient's immunotherapy was discontinued on 07/10/22 secondary to GI side effects from treatment, including increasing bloating, lack of appetite, epigastric pain, and nausea (followed by Dr. Russella Dar from gastroenterology).   Dr. Arbutus Ped will continue to monitor him closely with a repeat CT scan of the chest, abdomen and pelvis in 3 months.   No other significant interval history since the patient was last seen for follow-up.          ***                    Allergies:  is allergic to latex, bromocriptine, zestril [lisinopril], diovan [valsartan], hydrochlorothiazide, norvasc [amlodipine], and zocor [simvastatin].  Meds: Current Outpatient Medications  Medication Sig Dispense Refill   alum & mag hydroxide-simeth (MAALOX MAX) 400-400-40 MG/5ML suspension  Take 15 mLs by mouth every 6 (six) hours as needed for indigestion. 355 mL 0   aspirin EC 81 MG tablet Take 1 tablet (81 mg total) by mouth at bedtime. Okay to restart this medication on 07/03/2021 30 tablet 11   Cholecalciferol (VITAMIN D3) 2000 UNITS TABS Take 2,000 Units by mouth in the morning and at bedtime.     Coenzyme Q10 (COQ-10) 100 MG CAPS Take 100 mg by mouth in the morning.     Cyanocobalamin (B-12 PO) Take 1 tablet by mouth daily.     durvalumab (IMFINZI) 120 MG/2.4ML SOLN injection Inject into the vein.     folic acid (FOLVITE) 1 MG tablet TAKE 1 TABLET BY MOUTH EVERY DAY 90 tablet 1   irbesartan (AVAPRO) 300 MG tablet Take 0.5 tablets (150 mg total) by mouth daily. 90 tablet 3   lidocaine (XYLOCAINE) 2 % solution Use 10 ml four times a day as needed 40 mL 0   LORazepam (ATIVAN) 0.5 MG tablet Take 1 tablet (0.5 mg total) by mouth every 8 (eight) hours as needed for anxiety. 10 tablet 0   nitroGLYCERIN (NITROSTAT) 0.4 MG SL tablet Place 1 tablet (0.4 mg total) under the tongue every 5 (five) minutes x 3 doses as needed for chest pain (if no relief after 2nd dose, proceed to ED or call 911). 25 tablet 3   Omega-3 Fatty Acids (OMEGA-3 FISH OIL PO) Take 1,000 mg by mouth in the morning.     omeprazole (PRILOSEC) 40 MG capsule Take 40 mg  bid x 1 month then 40 mg once daily     ondansetron (ZOFRAN) 8 MG tablet Take 1 tablet (8 mg total) by mouth every 8 (eight) hours as needed for vomiting or nausea. 20 tablet 1   Polyethyl Glycol-Propyl Glycol (LUBRICANT EYE DROPS) 0.4-0.3 % SOLN Place 1-2 drops into both eyes 3 (three) times daily as needed (dry/irritated eyes.).     prochlorperazine (COMPAZINE) 10 MG tablet Take 1 tablet (10 mg total) by mouth every 6 (six) hours as needed for nausea or vomiting. 30 tablet 0   rosuvastatin (CRESTOR) 20 MG tablet TAKE HALF A TABLET (10 MG) BY MOUTH ONCE DAILY. 45 tablet 3   sucralfate (CARAFATE) 1 g tablet Take 1 tablet (1 g total) by mouth 4 (four) times  daily -  with meals and at bedtime. Crush and dissolve in 10 mL warm water prior to swallowing, take 20 min before meals 120 tablet 1   tadalafil (CIALIS) 5 MG tablet Take 5 mg by mouth daily as needed for erectile dysfunction.     tamsulosin (FLOMAX) 0.4 MG CAPS capsule Take 0.4 mg by mouth in the morning.     Testosterone 20.25 MG/ACT (1.62%) GEL Apply 2 Pump topically in the morning.     traMADol (ULTRAM) 50 MG tablet Take 1 tablet (50 mg total) by mouth every 6 (six) hours as needed. 20 tablet 0   vitamin C (ASCORBIC ACID) 500 MG tablet Take 500 mg by mouth in the morning.     No current facility-administered medications for this encounter.    Physical Findings: The patient is in no acute distress. Patient is alert and oriented.  vitals were not taken for this visit. .  No significant changes. Lungs are clear to auscultation bilaterally. Heart has regular rate and rhythm. No palpable cervical, supraclavicular, or axillary adenopathy. Abdomen soft, non-tender, normal bowel sounds.   Lab Findings: Lab Results  Component Value Date   WBC 3.9 (L) 07/10/2022   HGB 10.4 (L) 07/10/2022   HCT 32.1 (L) 07/10/2022   MCV 97.9 07/10/2022   PLT 149 (L) 07/10/2022    Radiographic Findings: No results found.  Impression: Synchronous primaries of adenocarcinoma & squamous cell carcinoma of the RUL, and non-small cell carcinoma favoring adenocarcinoma of LUL: s/p neoadjuvant chemotherapy and right upper lobectomy w/ lymphadenectomy     Recent PET scan with an intensely hypermetabolic precarinal node concerning for metastasis (January 2024)     The patient is recovering from the effects of radiation.  ***  Plan:  ***   *** minutes of total time was spent for this patient encounter, including preparation, face-to-face counseling with the patient and coordination of care, physical exam, and documentation of the encounter. ____________________________________  Billie Lade, PhD, MD  This  document serves as a record of services personally performed by Antony Blackbird, MD. It was created on his behalf by Neena Rhymes, a trained medical scribe. The creation of this record is based on the scribe's personal observations and the provider's statements to them. This document has been checked and approved by the attending provider.

## 2022-07-24 ENCOUNTER — Encounter: Payer: Self-pay | Admitting: Radiation Oncology

## 2022-07-24 ENCOUNTER — Ambulatory Visit: Payer: Medicare Other

## 2022-07-24 ENCOUNTER — Other Ambulatory Visit: Payer: Medicare Other

## 2022-07-24 ENCOUNTER — Ambulatory Visit: Payer: Medicare Other | Admitting: Physician Assistant

## 2022-07-24 ENCOUNTER — Encounter: Payer: Medicare Other | Admitting: Dietician

## 2022-07-24 ENCOUNTER — Ambulatory Visit
Admission: RE | Admit: 2022-07-24 | Discharge: 2022-07-24 | Disposition: A | Discharge status: Home / Self Care | Payer: Medicare Other | Source: Ambulatory Visit | Attending: Radiation Oncology | Admitting: Radiation Oncology

## 2022-07-24 VITALS — BP 130/75 | HR 81 | Temp 97.8°F | Resp 18 | Ht 72.0 in | Wt 176.4 lb

## 2022-07-24 DIAGNOSIS — C3411 Malignant neoplasm of upper lobe, right bronchus or lung: Secondary | ICD-10-CM | POA: Diagnosis not present

## 2022-07-24 DIAGNOSIS — C3491 Malignant neoplasm of unspecified part of right bronchus or lung: Secondary | ICD-10-CM

## 2022-07-24 DIAGNOSIS — Z79899 Other long term (current) drug therapy: Secondary | ICD-10-CM | POA: Diagnosis not present

## 2022-07-24 DIAGNOSIS — Z923 Personal history of irradiation: Secondary | ICD-10-CM | POA: Diagnosis not present

## 2022-07-24 MED ORDER — SUCRALFATE 1 G PO TABS
1.0000 g | ORAL_TABLET | Freq: Three times a day (TID) | ORAL | 1 refills | Status: DC
Start: 2022-07-24 — End: 2022-09-23

## 2022-07-24 NOTE — Progress Notes (Signed)
PRESS CASALE is here today for follow up post radiation to the lung.  Lung Side: Right,patient completed treatment on 05/06/22.  Does the patient complain of any of the following: Pain: 5/10 abdomen dull pain Shortness of breath w/wo exertion: At times Cough: Productive cough Hemoptysis: No Pain with swallowing: Occasional Swallowing/choking concerns: No, just feels like something stuck in throat Appetite: Eats but everything taste terrible.  Force self to eat. Energy Level: very low Post radiation skin Changes: No    Additional comments if applicable:

## 2022-07-31 ENCOUNTER — Inpatient Hospital Stay: Payer: Medicare Other | Admitting: Dietician

## 2022-07-31 ENCOUNTER — Other Ambulatory Visit: Payer: Self-pay

## 2022-07-31 NOTE — Progress Notes (Signed)
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 S/p neoadjuvant systemic chemotherapy followed by right upper lobectomy. Patient receiving consolidation treatment with Imfinzi (first dose 05/29/22) This was discontinued due to intolerance. Patient completed radiation therapy under the care of Dr. Roselind Messier 4/29.  Past medical history includes HTN, PAD, GERD, BPH, vit D deficiency, neuralgia  Met with patient and wife in clinic. Patient reports ongoing esophageal discomfort with oral intake. This is improving. He endorses ongoing abdominal pain that resolves after eating, but returns a short while later. Patient recalls eating 3 meals. His taste has not returned. Small glass of buttermilk, coffee, cereal/oatmeal for breakfast, leftovers or sandwich for lunch. Had salisbury steak, mashed potatoes, green beans for dinner. Reports currently drinking one Ensure Complete (350 kcal, 30 g) mid morning. He usually has a bowl of butter pecan ice cream before bed. Patient is s/p GI workup with endoscopy. Noted esophageal ulcer as well as erosive gastropathy without bleeding. Patient is taking carafate as prescribed. He does not feel this has provided much benefit. He was unable to tolerate viscous lidocaine. Patient is currently taking daily probiotics.  Nutrition Focused Physical Exam: deferred   Medications: folic acid, Vit D3, B12, avapro, ativan, prilosec, crestor, carafate, flomax, vit E (per pt)   Labs: 7/3 - glucose 103, albumin 3.3   Anthropometrics:  Height: 6' Weight: 176 lb 6 oz UBW: 180-185 lb BMI: 23.92   NUTRITION DIAGNOSIS: Food and nutrition related knowledge deficit related to cancer and associated treatments as evidenced by no prior need for associated  nutrition information   INTERVENTION:  Encouraged small frequent meals/snacks q3h - provided snack ideas, soft moist high protein foods Educated on soft diet - avoiding spicy, fried, greasy foods  Discussed strategies for altered taste, suggested baking soda salt water rinses several times daily and before meals - handout with tips + recipe given Continue taking carafate as prescribed - will contact provider for additional recommendations  Continue drinking Ensure Complete, recommend 2/day - coupons + samples provided Discontinue probiotics and vit E supplements Support and encouragement Follow-up with GI  Contact information provided    MONITORING, EVALUATION, GOAL: Patient will tolerate adequate calories and protein to support healing    Next Visit: No follow-up scheduled at this time. Encouraged to contact with nutrition questions/concerns

## 2022-08-07 ENCOUNTER — Ambulatory Visit: Payer: Medicare Other | Admitting: Physician Assistant

## 2022-08-07 ENCOUNTER — Ambulatory Visit: Payer: Medicare Other

## 2022-08-07 ENCOUNTER — Encounter (INDEPENDENT_AMBULATORY_CARE_PROVIDER_SITE_OTHER): Payer: Self-pay

## 2022-08-07 ENCOUNTER — Other Ambulatory Visit: Payer: Medicare Other

## 2022-08-15 ENCOUNTER — Telehealth: Payer: Self-pay | Admitting: Medical Oncology

## 2022-08-15 ENCOUNTER — Encounter: Payer: Self-pay | Admitting: Medical Oncology

## 2022-08-15 NOTE — Telephone Encounter (Signed)
Pt wants to restart immunotherapy . " Stomach is better".  Next appt in October

## 2022-08-20 DIAGNOSIS — R3912 Poor urinary stream: Secondary | ICD-10-CM | POA: Diagnosis not present

## 2022-08-21 ENCOUNTER — Other Ambulatory Visit: Payer: Medicare Other

## 2022-08-21 ENCOUNTER — Ambulatory Visit: Payer: Medicare Other

## 2022-08-21 ENCOUNTER — Ambulatory Visit: Payer: Medicare Other | Admitting: Internal Medicine

## 2022-08-22 ENCOUNTER — Encounter: Payer: Self-pay | Admitting: Internal Medicine

## 2022-08-23 ENCOUNTER — Encounter: Payer: Self-pay | Admitting: Internal Medicine

## 2022-09-01 ENCOUNTER — Encounter: Payer: Self-pay | Admitting: Internal Medicine

## 2022-09-01 NOTE — Progress Notes (Unsigned)
Subjective:    Patient ID: Patrick Harris, male    DOB: [birth year], 77 y.o.   MRN: [id number]     HPI E[place] is here for a physical exam and his chronic medical problems.  Was holding avapro since June - ? need-recently he did take Sudafed for his cold symptoms and his blood pressure did become elevated and he did take the Avapro for a couple of times.  Prior to that it was well-controlled.  He is not taking Sudafed any longer.    Cold symptoms - started with symptoms 1 week ago.  His cough is productive and has gotten worse.  He has chronic shortness of breath that he feels is stable.  He does have some wheezing.  Food still tastes and smells terrible-related to cancer treatment.  Has neuropathy in right hand and both feet.   Medications and allergies reviewed with patient and updated if appropriate.  Current Outpatient Medications on File Prior to Visit  Medication Sig Dispense Refill   aspirin EC 81 MG tablet Take 1 tablet (81 mg total) by mouth at bedtime. Okay to restart this medication on 07/03/2021 30 tablet 11   Cholecalciferol (VITAMIN D3) 2000 UNITS TABS Take 2,000 Units by mouth in the morning and at bedtime.     Coenzyme Q10 (COQ-10) 100 MG CAPS Take 100 mg by mouth in the morning.     finasteride (PROSCAR) 5 MG tablet Take 5 mg by mouth daily.     nitroGLYCERIN (NITROSTAT) 0.4 MG SL tablet Place 1 tablet (0.4 mg total) under the tongue every 5 (five) minutes x 3 doses as needed for chest pain (if no relief after 2nd dose, proceed to ED or call 911). 25 tablet 3   Omega-3 Fatty Acids (OMEGA-3 FISH OIL PO) Take 1,000 mg by mouth in the morning.     omeprazole (PRILOSEC) 40 MG capsule Take 40 mg bid x 1 month then 40 mg once daily     rosuvastatin (CRESTOR) 20 MG tablet TAKE HALF A TABLET (10 MG) BY MOUTH ONCE DAILY. 45 tablet 3   sucralfate (CARAFATE) 1 g tablet Take 1 tablet (1 g total) by mouth 4 (four) times daily -  with meals and at bedtime. Crush and dissolve in 10  mL warm water prior to swallowing, take 20 min before meals 120 tablet 1   tamsulosin (FLOMAX) 0.4 MG CAPS capsule Take 0.4 mg by mouth in the morning.     Testosterone 20.25 MG/ACT (1.62%) GEL Apply 2 Pump topically in the morning.     vitamin C (ASCORBIC ACID) 500 MG tablet Take 500 mg by mouth in the morning.     No current facility-administered medications on file prior to visit.    Review of Systems  Constitutional:  Positive for appetite change (low) and fatigue. Negative for fever.  HENT:  Positive for congestion and postnasal drip. Negative for ear pain, sinus pressure, sinus pain and sore throat.   Eyes:  Negative for visual disturbance.  Respiratory:  Positive for cough (productive - worse past couple of days), shortness of breath (chronic) and wheezing.   Cardiovascular:  Negative for chest pain, palpitations and leg swelling.  Gastrointestinal:  Positive for abdominal pain (a little - hungry). Negative for blood in stool, constipation and diarrhea.       No gerd  Genitourinary:  Positive for difficulty urinating (sees urology). Negative for dysuria.  Musculoskeletal:  Negative for arthralgias and back pain.  Skin:  Negative for  rash.  Neurological:  Positive for numbness (neuropathy hands, feet). Negative for light-headedness and headaches.  Psychiatric/Behavioral:  Negative for dysphoric mood. The patient is not nervous/anxious.        Objective:   Vitals:   09/02/22 0832 09/02/22 0924  BP: (!) 140/84 132/82  Pulse: 62   Temp: 97.7 F (36.5 C)   SpO2: 94%    Filed Weights   09/02/22 0832  Weight: 179 lb (81.2 kg)   Body mass index is 24.28 kg/m.  BP Readings from Last 3 Encounters:  09/02/22 132/82  07/24/22 130/75  07/10/22 (!) 145/77    Wt Readings from Last 3 Encounters:  09/02/22 179 lb (81.2 kg)  07/24/22 176 lb 6 oz (80 kg)  07/10/22 175 lb 3.2 oz (79.5 kg)      Physical Exam Constitutional: He appears well-developed and well-nourished. No  distress.  HENT:  Head: Normocephalic and atraumatic.  Right Ear: External ear normal.  Left Ear: External ear normal.  Normal ear canals and TM b/l  Mouth/Throat: Oropharynx is clear and moist. Eyes: Conjunctivae and EOM are normal.  Neck: Neck supple. No tracheal deviation present. No thyromegaly present.  No carotid bruit  Cardiovascular: Normal rate, regular rhythm, normal heart sounds and intact distal pulses.   No murmur heard.  No lower extremity edema. Pulmonary/Chest: Effort normal  No respiratory distress. He has no wheezes.  Slight crackles right base which sounds dry Abdominal: Soft. He exhibits no distension. There is no tenderness.  Genitourinary: deferred  Lymphadenopathy:   He has no cervical adenopathy.  Skin: Skin is warm and dry. He is not diaphoretic.  Psychiatric: He has a normal mood and affect. His behavior is normal.         Assessment & Plan:   Physical exam: Screening blood work  ordered Exercise   active - walking some Weight  normal Substance abuse   none   Reviewed recommended immunizations.   Health Maintenance  Topic Date Due   Colonoscopy  08/27/2021   Medicare Annual Wellness (AWV)  09/25/2022   COVID-19 Vaccine (4 - 2023-24 season) 09/18/2022 (Originally 09/07/2021)   Zoster Vaccines- Shingrix (1 of 2) 12/03/2022 (Originally 07/15/1964)   INFLUENZA VACCINE  04/07/2023 (Originally 08/08/2022)   DTaP/Tdap/Td (4 - Td or Tdap) 05/05/2031   Pneumonia Vaccine 22+ Years old  Completed   Hepatitis C Screening  Completed   HPV VACCINES  Aged Out     See Problem List for Assessment and Plan of chronic medical problems.

## 2022-09-01 NOTE — Patient Instructions (Addendum)
Blood work was ordered.   The lab is on the first floor.    Medications changes include :       A referral was ordered and someone will call you to schedule an appointment.     Return in about 6 months (around 03/05/2023) for follow up.    Health Maintenance, Male Adopting a healthy lifestyle and getting preventive care are important in promoting health and wellness. Ask your health care provider about: The right schedule for you to have regular tests and exams. Things you can do on your own to prevent diseases and keep yourself healthy. What should I know about diet, weight, and exercise? Eat a healthy diet  Eat a diet that includes plenty of vegetables, fruits, low-fat dairy products, and lean protein. Do not eat a lot of foods that are high in solid fats, added sugars, or sodium. Maintain a healthy weight Body mass index (BMI) is a measurement that can be used to identify possible weight problems. It estimates body fat based on height and weight. Your health care provider can help determine your BMI and help you achieve or maintain a healthy weight. Get regular exercise Get regular exercise. This is one of the most important things you can do for your health. Most adults should: Exercise for at least 150 minutes each week. The exercise should increase your heart rate and make you sweat (moderate-intensity exercise). Do strengthening exercises at least twice a week. This is in addition to the moderate-intensity exercise. Spend less time sitting. Even light physical activity can be beneficial. Watch cholesterol and blood lipids Have your blood tested for lipids and cholesterol at 77 years of age, then have this test every 5 years. You may need to have your cholesterol levels checked more often if: Your lipid or cholesterol levels are high. You are older than 76 years of age. You are at high risk for heart disease. What should I know about cancer screening? Many types  of cancers can be detected early and may often be prevented. Depending on your health history and family history, you may need to have cancer screening at various ages. This may include screening for: Colorectal cancer. Prostate cancer. Skin cancer. Lung cancer. What should I know about heart disease, diabetes, and high blood pressure? Blood pressure and heart disease High blood pressure causes heart disease and increases the risk of stroke. This is more likely to develop in people who have high blood pressure readings or are overweight. Talk with your health care provider about your target blood pressure readings. Have your blood pressure checked: Every 3-5 years if you are 47-29 years of age. Every year if you are 109 years old or older. If you are between the ages of 81 and 10 and are a current or former smoker, ask your health care provider if you should have a one-time screening for abdominal aortic aneurysm (AAA). Diabetes Have regular diabetes screenings. This checks your fasting blood sugar level. Have the screening done: Once every three years after age 34 if you are at a normal weight and have a low risk for diabetes. More often and at a younger age if you are overweight or have a high risk for diabetes. What should I know about preventing infection? Hepatitis B If you have a higher risk for hepatitis B, you should be screened for this virus. Talk with your health care provider to find out if you are at risk for hepatitis B infection.  Hepatitis C Blood testing is recommended for: Everyone born from [birth year] through 1965. Anyone with known risk factors for hepatitis C. Sexually transmitted infections (STIs) You should be screened each year for STIs, including gonorrhea and chlamydia, if: You are sexually active and are younger than 77 years of age. You are older than 77 years of age and your health care provider tells you that you are at risk for this type of infection. Your sexual  activity has changed since you were last screened, and you are at increased risk for chlamydia or gonorrhea. Ask your health care provider if you are at risk. Ask your health care provider about whether you are at high risk for HIV. Your health care provider may recommend a prescription medicine to help prevent HIV infection. If you choose to take medicine to prevent HIV, you should first get tested for HIV. You should then be tested every 3 months for as long as you are taking the medicine. Follow these instructions at home: Alcohol use Do not drink alcohol if your health care provider tells you not to drink. If you drink alcohol: Limit how much you have to 0-2 drinks a day. Know how much alcohol is in your drink. In the [place] one drink equals one 12 oz bottle of beer (355 mL), one 5 oz glass of wine (148 mL), or one 1 oz glass of hard liquor (44 mL). Lifestyle Do not use any products that contain nicotine or tobacco. These products include cigarettes, chewing tobacco, and vaping devices, such as e-cigarettes. If you need help quitting, ask your health care provider. Do not use street drugs. Do not share needles. Ask your health care provider for help if you need support or information about quitting drugs. General instructions Schedule regular health, dental, and eye exams. Stay current with your vaccines. Tell your health care provider if: You often feel depressed. You have ever been abused or do not feel safe at home. Summary Adopting a healthy lifestyle and getting preventive care are important in promoting health and wellness. Follow your health care provider's instructions about healthy diet, exercising, and getting tested or screened for diseases. Follow your health care provider's instructions on monitoring your cholesterol and blood pressure. This information is not intended to replace advice given to you by your health care provider. Make sure you discuss any questions you have  with your health care provider. Document Revised: 05/15/2020 Document Reviewed: 05/15/2020 Elsevier Patient Education  2024 [employer].

## 2022-09-02 ENCOUNTER — Ambulatory Visit (INDEPENDENT_AMBULATORY_CARE_PROVIDER_SITE_OTHER): Payer: Medicare Other | Admitting: Internal Medicine

## 2022-09-02 VITALS — BP 132/82 | HR 62 | Temp 97.7°F | Ht 72.0 in | Wt 179.0 lb

## 2022-09-02 DIAGNOSIS — D649 Anemia, unspecified: Secondary | ICD-10-CM

## 2022-09-02 DIAGNOSIS — Z125 Encounter for screening for malignant neoplasm of prostate: Secondary | ICD-10-CM

## 2022-09-02 DIAGNOSIS — E559 Vitamin D deficiency, unspecified: Secondary | ICD-10-CM

## 2022-09-02 DIAGNOSIS — E782 Mixed hyperlipidemia: Secondary | ICD-10-CM | POA: Diagnosis not present

## 2022-09-02 DIAGNOSIS — I700 Atherosclerosis of aorta: Secondary | ICD-10-CM | POA: Diagnosis not present

## 2022-09-02 DIAGNOSIS — G620 Drug-induced polyneuropathy: Secondary | ICD-10-CM | POA: Insufficient documentation

## 2022-09-02 DIAGNOSIS — T451X5A Adverse effect of antineoplastic and immunosuppressive drugs, initial encounter: Secondary | ICD-10-CM | POA: Insufficient documentation

## 2022-09-02 DIAGNOSIS — Z0000 Encounter for general adult medical examination without abnormal findings: Secondary | ICD-10-CM

## 2022-09-02 DIAGNOSIS — I10 Essential (primary) hypertension: Secondary | ICD-10-CM

## 2022-09-02 DIAGNOSIS — J22 Unspecified acute lower respiratory infection: Secondary | ICD-10-CM | POA: Diagnosis not present

## 2022-09-02 DIAGNOSIS — I2510 Atherosclerotic heart disease of native coronary artery without angina pectoris: Secondary | ICD-10-CM

## 2022-09-02 DIAGNOSIS — K219 Gastro-esophageal reflux disease without esophagitis: Secondary | ICD-10-CM

## 2022-09-02 DIAGNOSIS — R7303 Prediabetes: Secondary | ICD-10-CM | POA: Diagnosis not present

## 2022-09-02 LAB — CBC WITH DIFFERENTIAL/PLATELET
Basophils Absolute: 0 10*3/uL (ref 0.0–0.1)
Basophils Relative: 0.4 % (ref 0.0–3.0)
Eosinophils Absolute: 0.1 10*3/uL (ref 0.0–0.7)
Eosinophils Relative: 1.6 % (ref 0.0–5.0)
HCT: 43.3 % (ref 39.0–52.0)
Hemoglobin: 13.8 g/dL (ref 13.0–17.0)
Lymphocytes Relative: 16.5 % (ref 12.0–46.0)
Lymphs Abs: 0.8 10*3/uL (ref 0.7–4.0)
MCHC: 31.9 g/dL (ref 30.0–36.0)
MCV: 94.8 fl (ref 78.0–100.0)
Monocytes Absolute: 0.6 10*3/uL (ref 0.1–1.0)
Monocytes Relative: 12.3 % — ABNORMAL HIGH (ref 3.0–12.0)
Neutro Abs: 3.5 10*3/uL (ref 1.4–7.7)
Neutrophils Relative %: 69.2 % (ref 43.0–77.0)
Platelets: 163 10*3/uL (ref 150.0–400.0)
RBC: 4.56 Mil/uL (ref 4.22–5.81)
RDW: 16.3 % — ABNORMAL HIGH (ref 11.5–15.5)
WBC: 5 10*3/uL (ref 4.0–10.5)

## 2022-09-02 LAB — COMPREHENSIVE METABOLIC PANEL
ALT: 16 U/L (ref 0–53)
AST: 17 U/L (ref 0–37)
Albumin: 4 g/dL (ref 3.5–5.2)
Alkaline Phosphatase: 73 U/L (ref 39–117)
BUN: 15 mg/dL (ref 6–23)
CO2: 29 mEq/L (ref 19–32)
Calcium: 9.3 mg/dL (ref 8.4–10.5)
Chloride: 102 mEq/L (ref 96–112)
Creatinine, Ser: 1.01 mg/dL (ref 0.40–1.50)
GFR: 71.93 mL/min (ref >60.00)
Glucose, Bld: 102 mg/dL — ABNORMAL HIGH (ref 70–99)
Potassium: 4.7 mEq/L (ref 3.5–5.1)
Sodium: 139 mEq/L (ref 135–145)
Total Bilirubin: 0.5 mg/dL (ref 0.2–1.2)
Total Protein: 7 g/dL (ref 6.0–8.3)

## 2022-09-02 LAB — HEMOGLOBIN A1C: Hgb A1c MFr Bld: 5.9 % (ref 4.6–6.5)

## 2022-09-02 LAB — VITAMIN B12: Vitamin B-12: 852 pg/mL (ref 211–911)

## 2022-09-02 LAB — LIPID PANEL
Cholesterol: 149 mg/dL (ref 0–200)
HDL: 34.2 mg/dL — ABNORMAL LOW (ref >39.00)
LDL Cholesterol: 94 mg/dL (ref 0–99)
NonHDL: 114.48
Total CHOL/HDL Ratio: 4
Triglycerides: 104 mg/dL (ref 0.0–149.0)
VLDL: 20.8 mg/dL (ref 0.0–40.0)

## 2022-09-02 LAB — VITAMIN D 25 HYDROXY (VIT D DEFICIENCY, FRACTURES): VITD: 47.01 ng/mL (ref 30.00–100.00)

## 2022-09-02 LAB — PSA, MEDICARE: PSA: 2.93 ng/ml (ref 0.10–4.00)

## 2022-09-02 MED ORDER — AZITHROMYCIN 250 MG PO TABS: ORAL_TABLET | ORAL | 0 refills | Status: AC

## 2022-09-02 MED ORDER — CEFDINIR 300 MG PO CAPS
300.0000 mg | ORAL_CAPSULE | Freq: Two times a day (BID) | ORAL | 0 refills | Status: AC
Start: 2022-09-02 — End: ?

## 2022-09-02 NOTE — Assessment & Plan Note (Signed)
Acute Came down with URI last week which has gotten worse and deeper in his lungs Has productive cough, wheezing and shortness of breath which is chronic-hard to say if it is worse or not Concern for infection given history of lung cancer, lung surgery and recent immunotherapy Start Omnicef 300 mg twice daily x 10 days and Z-Pak He will call or return if he is not feeling better

## 2022-09-02 NOTE — Assessment & Plan Note (Signed)
Chronic Lab Results  Component Value Date   HGBA1C 6.0 08/29/2020   Encouraged healthy diet and maintaining exercise

## 2022-09-02 NOTE — Assessment & Plan Note (Addendum)
Following with cardiology No chest pain Continue current medications 

## 2022-09-02 NOTE — Assessment & Plan Note (Addendum)
Chronic Hydralazine discontinued June 06/2022 due to hypotension from weight loss Has been holding Avapro - discontinue BP at home controlled Continue to monitor BP at home - if BP elevates - will start avapro 75 mg daily

## 2022-09-02 NOTE — Assessment & Plan Note (Signed)
Chronic Will check B12 level

## 2022-09-02 NOTE — Assessment & Plan Note (Addendum)
Chronic GERD controlled Continue omeprazole 40 mg daily-continue given history of ulcers

## 2022-09-02 NOTE — Assessment & Plan Note (Signed)
Chronic Check lipid panel  Continue crestor 20 mg daily Regular exercise and healthy diet encouraged  

## 2022-09-02 NOTE — Assessment & Plan Note (Signed)
Chronic Check lipids today Lab Results  Component Value Date   LDLCALC 65 08/29/2020   LDL well controlled Continue crestor 20 mg qd Advised healthy diet and regular exercise

## 2022-09-02 NOTE — Assessment & Plan Note (Signed)
Chronic Taking vitamin d daily Check level 

## 2022-09-03 ENCOUNTER — Other Ambulatory Visit: Payer: Self-pay | Admitting: Internal Medicine

## 2022-09-05 ENCOUNTER — Other Ambulatory Visit: Payer: Self-pay | Admitting: Internal Medicine

## 2022-09-19 ENCOUNTER — Other Ambulatory Visit: Payer: Self-pay | Admitting: Internal Medicine

## 2022-09-20 ENCOUNTER — Telehealth: Payer: Self-pay

## 2022-09-20 NOTE — Telephone Encounter (Signed)
Patient requested a refill on Sucralfate 1 GM TABLET. [facility] [place].

## 2022-09-23 ENCOUNTER — Other Ambulatory Visit: Payer: Self-pay | Admitting: Radiation Oncology

## 2022-09-23 DIAGNOSIS — C3491 Malignant neoplasm of unspecified part of right bronchus or lung: Secondary | ICD-10-CM

## 2022-09-23 MED ORDER — SUCRALFATE 1 G PO TABS
1.0000 g | ORAL_TABLET | Freq: Three times a day (TID) | ORAL | 1 refills | Status: DC
Start: 2022-09-23 — End: 2022-11-28

## 2022-09-26 ENCOUNTER — Other Ambulatory Visit: Payer: Self-pay | Admitting: Internal Medicine

## 2022-10-08 ENCOUNTER — Telehealth: Payer: Self-pay | Admitting: Internal Medicine

## 2022-10-08 MED ORDER — IRBESARTAN 75 MG PO TABS: 75.0000 mg | ORAL_TABLET | Freq: Every day | ORAL | 5 refills | Status: DC

## 2022-10-08 NOTE — Telephone Encounter (Signed)
Patient was seen by Dr. Lawerance Bach on 09/02/22. He stopped taking his blood pressure medication because it was lower due to weight loss. He spoke with Dr. Lawerance Bach about it and she told him to call if it went back up and she would send in a lower dose of the same medications. Patient said this morning his BP was 155/75. He would like to know if she can send in hydralazine and irbesartan to [id number] - [place], [place] - [address] AT [facility]. Best callback is [phone].

## 2022-10-08 NOTE — Telephone Encounter (Signed)
I sent irbesartan 75 mg daily - lets start with that and see how his BP is.  Monitor BP and update [place] on readings

## 2022-10-08 NOTE — Telephone Encounter (Signed)
Spoke with patient today and info given. 

## 2022-10-10 ENCOUNTER — Other Ambulatory Visit: Payer: Self-pay | Admitting: Internal Medicine

## 2022-10-10 ENCOUNTER — Ambulatory Visit (HOSPITAL_COMMUNITY)
Admission: RE | Admit: 2022-10-10 | Discharge: 2022-10-10 | Disposition: A | Discharge status: Home / Self Care | Payer: Medicare Other | Source: Ambulatory Visit | Attending: Internal Medicine | Admitting: Internal Medicine

## 2022-10-10 ENCOUNTER — Inpatient Hospital Stay: Payer: Medicare Other | Attending: Internal Medicine

## 2022-10-10 DIAGNOSIS — C3411 Malignant neoplasm of upper lobe, right bronchus or lung: Secondary | ICD-10-CM | POA: Insufficient documentation

## 2022-10-10 DIAGNOSIS — C3491 Malignant neoplasm of unspecified part of right bronchus or lung: Secondary | ICD-10-CM

## 2022-10-10 DIAGNOSIS — C3412 Malignant neoplasm of upper lobe, left bronchus or lung: Secondary | ICD-10-CM | POA: Insufficient documentation

## 2022-10-10 DIAGNOSIS — R531 Weakness: Secondary | ICD-10-CM | POA: Insufficient documentation

## 2022-10-10 DIAGNOSIS — Z902 Acquired absence of lung [part of]: Secondary | ICD-10-CM | POA: Insufficient documentation

## 2022-10-10 DIAGNOSIS — R5383 Other fatigue: Secondary | ICD-10-CM | POA: Diagnosis not present

## 2022-10-10 DIAGNOSIS — Z923 Personal history of irradiation: Secondary | ICD-10-CM | POA: Diagnosis not present

## 2022-10-10 DIAGNOSIS — Z9221 Personal history of antineoplastic chemotherapy: Secondary | ICD-10-CM | POA: Insufficient documentation

## 2022-10-10 DIAGNOSIS — R1310 Dysphagia, unspecified: Secondary | ICD-10-CM | POA: Insufficient documentation

## 2022-10-10 DIAGNOSIS — C3490 Malignant neoplasm of unspecified part of unspecified bronchus or lung: Secondary | ICD-10-CM

## 2022-10-10 DIAGNOSIS — N281 Cyst of kidney, acquired: Secondary | ICD-10-CM | POA: Diagnosis not present

## 2022-10-10 DIAGNOSIS — I700 Atherosclerosis of aorta: Secondary | ICD-10-CM | POA: Diagnosis not present

## 2022-10-10 LAB — CBC WITH DIFFERENTIAL (CANCER CENTER ONLY)
Abs Immature Granulocytes: 0.01 10*3/uL (ref 0.00–0.07)
Basophils Absolute: 0 10*3/uL (ref 0.0–0.1)
Basophils Relative: 1 %
Eosinophils Absolute: 0.1 10*3/uL (ref 0.0–0.5)
Eosinophils Relative: 3 %
HCT: 39.2 % (ref 39.0–52.0)
Hemoglobin: 13.2 g/dL (ref 13.0–17.0)
Immature Granulocytes: 0 %
Lymphocytes Relative: 18 %
Lymphs Abs: 0.7 10*3/uL (ref 0.7–4.0)
MCH: 31.2 pg (ref 26.0–34.0)
MCHC: 33.7 g/dL (ref 30.0–36.0)
MCV: 92.7 fL (ref 80.0–100.0)
Monocytes Absolute: 0.5 10*3/uL (ref 0.1–1.0)
Monocytes Relative: 13 %
Neutro Abs: 2.4 10*3/uL (ref 1.7–7.7)
Neutrophils Relative %: 65 %
Platelet Count: 133 10*3/uL — ABNORMAL LOW (ref 150–400)
RBC: 4.23 MIL/uL (ref 4.22–5.81)
RDW: 14.1 % (ref 11.5–15.5)
WBC Count: 3.7 10*3/uL — ABNORMAL LOW (ref 4.0–10.5)
nRBC: 0 % (ref 0.0–0.2)

## 2022-10-10 LAB — CMP (CANCER CENTER ONLY)
ALT: 13 U/L (ref 0–44)
AST: 18 U/L (ref 15–41)
Albumin: 3.9 g/dL (ref 3.5–5.0)
Alkaline Phosphatase: 58 U/L (ref 38–126)
Anion gap: 4 — ABNORMAL LOW (ref 5–15)
BUN: 18 mg/dL (ref 8–23)
CO2: 30 mmol/L (ref 22–32)
Calcium: 9.4 mg/dL (ref 8.9–10.3)
Chloride: 106 mmol/L (ref 98–111)
Creatinine: 0.96 mg/dL (ref 0.61–1.24)
GFR, Estimated: >60 mL/min (ref >60)
Glucose, Bld: 106 mg/dL — ABNORMAL HIGH (ref 70–99)
Potassium: 4 mmol/L (ref 3.5–5.1)
Sodium: 140 mmol/L (ref 135–145)
Total Bilirubin: 0.6 mg/dL (ref 0.3–1.2)
Total Protein: 6.7 g/dL (ref 6.5–8.1)

## 2022-10-10 MED ORDER — IOHEXOL 300 MG/ML  SOLN
100.0000 mL | Freq: Once | INTRAMUSCULAR | Status: AC | PRN
  Administered 2022-10-10: 100 mL via INTRAVENOUS

## 2022-10-14 NOTE — Progress Notes (Signed)
Radiation Oncology         [phone] ________________________________  Name: Patrick Harris MRN: [id number]  Date: 10/15/2022  DOB: 1945-02-23  Follow-Up Visit Note  CC: Patrick Sanes, MD  Patrick Harris,*  No diagnosis found.  Diagnosis: The primary encounter diagnosis was Malignant neoplasm of right upper lobe of lung (HCC). A diagnosis of Non-small cell cancer of right lung [facility]) was also pertinent to this visit.   Synchronous primaries of adenocarcinoma & squamous cell carcinoma of the RUL, and non-small cell carcinoma favoring adenocarcinoma of LUL: s/p neoadjuvant chemotherapy and right upper lobectomy w/ lymphadenectomy     Recent PET scan with an intensely hypermetabolic precarinal node concerning for metastasis (January 2024)     Interval Since Last Radiation: 5 months and 9 days   Indication for treatment: Curative      Radiation treatment dates: 03/19/22 through 05/06/22 Site/dose: Chest - 60 Gy delivered in 30 Fx at 2 Gy/Fx Beams/energy: 6X, 10X Technique: 3D  Narrative:  The patient returns today for routine follow-up and to review recent imaging. He was last seen here for follow-up on 07/24/22.     He recently presented for a restating CT CAP with contrast on 10/10/22 which demonstrates: ***                             Allergies:  is allergic to latex, bromocriptine, zestril [lisinopril], diovan [valsartan], hydrochlorothiazide, norvasc [amlodipine], and zocor [simvastatin].  Meds: Current Outpatient Medications  Medication Sig Dispense Refill   aspirin EC 81 MG tablet Take 1 tablet (81 mg total) by mouth at bedtime. Okay to restart this medication on 07/03/2021 30 tablet 11   Cholecalciferol (VITAMIN D3) 2000 UNITS TABS Take 2,000 Units by mouth in the morning and at bedtime.     Coenzyme Q10 (COQ-10) 100 MG CAPS Take 100 mg by mouth in the morning.     finasteride (PROSCAR) 5 MG tablet Take 5 mg by mouth daily.     folic acid (FOLVITE) 1 MG tablet  TAKE 1 TABLET BY MOUTH EVERY DAY 90 tablet 1   irbesartan (AVAPRO) 75 MG tablet Take 1 tablet (75 mg total) by mouth daily. 30 tablet 5   nitroGLYCERIN (NITROSTAT) 0.4 MG SL tablet Place 1 tablet (0.4 mg total) under the tongue every 5 (five) minutes x 3 doses as needed for chest pain (if no relief after 2nd dose, proceed to ED or call 911). 25 tablet 3   Omega-3 Fatty Acids (OMEGA-3 FISH OIL PO) Take 1,000 mg by mouth in the morning.     omeprazole (PRILOSEC) 40 MG capsule Take 40 mg bid x 1 month then 40 mg once daily     rosuvastatin (CRESTOR) 20 MG tablet TAKE HALF A TABLET (10 MG) BY MOUTH ONCE DAILY. 45 tablet 3   sucralfate (CARAFATE) 1 g tablet Take 1 tablet (1 g total) by mouth 4 (four) times daily -  with meals and at bedtime. Crush and dissolve in 10 mL warm water prior to swallowing, take 20 min before meals 120 tablet 1   tamsulosin (FLOMAX) 0.4 MG CAPS capsule Take 0.4 mg by mouth in the morning.     Testosterone 20.25 MG/ACT (1.62%) GEL Apply 2 Pump topically in the morning.     vitamin C (ASCORBIC ACID) 500 MG tablet Take 500 mg by mouth in the morning.     No current facility-administered medications for this encounter.  Physical Findings: The patient is in no acute distress. Patient is alert and oriented.  vitals were not taken for this visit. .  No significant changes. Lungs are clear to auscultation bilaterally. Heart has regular rate and rhythm. No palpable cervical, supraclavicular, or axillary adenopathy. Abdomen soft, non-tender, normal bowel sounds.   Lab Findings: Lab Results  Component Value Date   WBC 3.7 (L) 10/10/2022   HGB 13.2 10/10/2022   HCT 39.2 10/10/2022   MCV 92.7 10/10/2022   PLT 133 (L) 10/10/2022    Radiographic Findings: No results found.  Impression:  Synchronous primaries of adenocarcinoma & squamous cell carcinoma of the RUL, and non-small cell carcinoma favoring adenocarcinoma of LUL: s/p neoadjuvant chemotherapy and right upper  lobectomy w/ lymphadenectomy     Recent PET scan with an intensely hypermetabolic precarinal node concerning for metastasis (January 2024)     The patient is recovering from the effects of radiation.  ***  Plan:  ***   *** minutes of total time was spent for this patient encounter, including preparation, face-to-face counseling with the patient and coordination of care, physical exam, and documentation of the encounter. ____________________________________  Billie Lade, PhD, MD  This document serves as a record of services personally performed by Antony Blackbird, MD. It was created on his behalf by Neena Rhymes, a trained medical scribe. The creation of this record is based on the scribe's personal observations and the provider's statements to them. This document has been checked and approved by the attending provider.

## 2022-10-15 ENCOUNTER — Encounter: Payer: Self-pay | Admitting: Radiation Oncology

## 2022-10-15 ENCOUNTER — Inpatient Hospital Stay (HOSPITAL_BASED_OUTPATIENT_CLINIC_OR_DEPARTMENT_OTHER): Payer: Medicare Other | Admitting: Internal Medicine

## 2022-10-15 ENCOUNTER — Ambulatory Visit
Admission: RE | Admit: 2022-10-15 | Discharge: 2022-10-15 | Disposition: A | Discharge status: Home / Self Care | Payer: Medicare Other | Source: Ambulatory Visit | Attending: Radiation Oncology | Admitting: Radiation Oncology

## 2022-10-15 VITALS — BP 147/86 | HR 72 | Temp 97.8°F | Resp 16 | Ht 72.0 in | Wt 185.2 lb

## 2022-10-15 VITALS — BP 144/78 | HR 75 | Temp 97.7°F | Resp 20 | Ht 72.0 in | Wt 185.4 lb

## 2022-10-15 DIAGNOSIS — R1310 Dysphagia, unspecified: Secondary | ICD-10-CM | POA: Insufficient documentation

## 2022-10-15 DIAGNOSIS — Z923 Personal history of irradiation: Secondary | ICD-10-CM | POA: Insufficient documentation

## 2022-10-15 DIAGNOSIS — N400 Enlarged prostate without lower urinary tract symptoms: Secondary | ICD-10-CM | POA: Insufficient documentation

## 2022-10-15 DIAGNOSIS — Z79899 Other long term (current) drug therapy: Secondary | ICD-10-CM | POA: Diagnosis not present

## 2022-10-15 DIAGNOSIS — Z95828 Presence of other vascular implants and grafts: Secondary | ICD-10-CM | POA: Diagnosis not present

## 2022-10-15 DIAGNOSIS — C3491 Malignant neoplasm of unspecified part of right bronchus or lung: Secondary | ICD-10-CM

## 2022-10-15 DIAGNOSIS — R918 Other nonspecific abnormal finding of lung field: Secondary | ICD-10-CM

## 2022-10-15 DIAGNOSIS — J432 Centrilobular emphysema: Secondary | ICD-10-CM | POA: Diagnosis not present

## 2022-10-15 DIAGNOSIS — Z87891 Personal history of nicotine dependence: Secondary | ICD-10-CM | POA: Diagnosis not present

## 2022-10-15 DIAGNOSIS — Z9221 Personal history of antineoplastic chemotherapy: Secondary | ICD-10-CM | POA: Diagnosis not present

## 2022-10-15 DIAGNOSIS — I700 Atherosclerosis of aorta: Secondary | ICD-10-CM | POA: Diagnosis not present

## 2022-10-15 DIAGNOSIS — C3411 Malignant neoplasm of upper lobe, right bronchus or lung: Secondary | ICD-10-CM | POA: Insufficient documentation

## 2022-10-15 DIAGNOSIS — R531 Weakness: Secondary | ICD-10-CM | POA: Diagnosis not present

## 2022-10-15 DIAGNOSIS — C3490 Malignant neoplasm of unspecified part of unspecified bronchus or lung: Secondary | ICD-10-CM

## 2022-10-15 DIAGNOSIS — R5383 Other fatigue: Secondary | ICD-10-CM | POA: Diagnosis not present

## 2022-10-15 DIAGNOSIS — Z902 Acquired absence of lung [part of]: Secondary | ICD-10-CM | POA: Diagnosis not present

## 2022-10-15 DIAGNOSIS — C3412 Malignant neoplasm of upper lobe, left bronchus or lung: Secondary | ICD-10-CM | POA: Diagnosis not present

## 2022-10-15 MED ORDER — HEPARIN SOD (PORK) LOCK FLUSH 100 UNIT/ML IV SOLN
500.0000 [IU] | Freq: Once | INTRAVENOUS | Status: AC
  Administered 2022-10-15: 500 [IU]

## 2022-10-15 MED ORDER — SODIUM CHLORIDE 0.9% FLUSH
10.0000 mL | Freq: Once | INTRAVENOUS | Status: AC
  Administered 2022-10-15: 10 mL

## 2022-10-15 NOTE — Progress Notes (Signed)
[facility] [phone]   [phone]  OFFICE PROGRESS NOTE  [name], MD [address] [place] [address]  DIAGNOSIS:  Stage IIIA (T1c, N 2, M0) adenocarcinoma in the right upper lobe as well as stage IA (T1c, N0, M0) squamous cell carcinoma involving the right upper lobe and stage IA (T1b, N0, M0) adenocarcinoma involving the left upper lobe diagnosed in June 2023.  PRIOR THERAPY:  1) status post neoadjuvant systemic chemotherapy with carboplatin, paclitaxel and nivolumab for 3 cycles in [facility] followed by right upper lobectomy with lymph node dissection at [facility] under the care of Dr. Glenna Durand with the final pathology showing residual disease with synchronous adenocarcinoma as well as squamous cell carcinoma in the right upper lobe and multiple mediastinal lymph node involvement performed on November 13, 2021. 2)  evidence for disease recurrence with involvement of 4R lymphadenopathy consistent with metastatic adenocarcinoma on February 26, 2022 biopsy. Molecular studies showed no actionable mutations and PD-L1 expression was 15% in the adenocarcinoma in 2% in the squamous cell carcinoma. 3) Concurrent chemotherapy with carboplatin for AUC of 5 and Alimta 500 Mg/M2 every 3 weeks for 2 cycles during the course of radiotherapy.  Last dose was giving 04/08/2022. 4) Consolidation treatment with immunotherapy with Imfinzi 1500 Mg IV every 4 weeks.  First dose May 29, 2022 status post 1 cycle.  This treatment was discontinued secondary to intolerance  CURRENT THERAPY: Observation  INTERVAL HISTORY: Patrick Harris 77 y.o. male returns to the clinic today for follow-up visit accompanied by his wife.Discussed the use of AI scribe software for clinical note transcription with the patient, who gave verbal consent to proceed.  History of Present Illness   Patrick Harris, a 77 year old patient with a history of Stage IIIA (T1c, N  2, M0) adenocarcinoma in the right upper lobe as well as stage IA (T1c, N0, M0) squamous cell carcinoma involving the right upper lobe and stage IA (T1b, N0, M0) adenocarcinoma involving the left upper lobe diagnosed in June 2023, presents with ongoing weakness and fatigue since the cessation of consolidation immune therapy with Imfinzi in May 2024. The therapy was discontinued after one cycle due to poor tolerance. The patient also reports a persistent burning sensation when swallowing and a sore spot in the submandibular area of the neck. The sore spot is tender to touch and the patient feels a sensation of 'something there' when swallowing.  The patient also mentions a lack of stamina and shortness of breath when standing or moving around, but not when sitting. There is a cough present, but no hemoptysis. The patient denies any nausea, vomiting, diarrhea, headaches, or changes in vision.  The patient has gained weight recently, despite reporting that food does not taste good. The patient's Port A Cath has not been flushed for two to three months, and there is no recent use for scans. The patient denies any abdominal pain, leg swelling, or other significant symptoms.       MEDICAL HISTORY: Past Medical History:  Diagnosis Date   Arthritis    CAD (coronary artery disease)    CABG with LIMA to LAD and RIMA to RCA August 2000 - Dr. Dorris Fetch   Cancer [facility])    basal cell   CHF (congestive heart failure) (HCC)    COPD (chronic obstructive pulmonary disease) (HCC)    Elevated glycated hemoglobin    Essential hypertension    GERD (gastroesophageal reflux disease)  Heart murmur    History of radiation therapy    Right lung(chest) 03/19/22-05/06/22- Dr. Antony Blackbird   Hyperlipidemia    Hypopituitarism [facility])    Lung cancer (HCC)    Osteopenia    Dr Juleen China   Pancreatitis    Peyronie disease    Testosterone deficiency    Dr Juleen China    ALLERGIES:  is allergic to latex, bromocriptine, zestril  [lisinopril], diovan [valsartan], hydrochlorothiazide, norvasc [amlodipine], and zocor [simvastatin].  MEDICATIONS:  Current Outpatient Medications  Medication Sig Dispense Refill   aspirin EC 81 MG tablet Take 1 tablet (81 mg total) by mouth at bedtime. Okay to restart this medication on 07/03/2021 30 tablet 11   Cholecalciferol (VITAMIN D3) 2000 UNITS TABS Take 2,000 Units by mouth in the morning and at bedtime.     Coenzyme Q10 (COQ-10) 100 MG CAPS Take 100 mg by mouth in the morning.     finasteride (PROSCAR) 5 MG tablet Take 5 mg by mouth daily.     folic acid (FOLVITE) 1 MG tablet TAKE 1 TABLET BY MOUTH EVERY DAY (Patient not taking: Reported on 10/15/2022) 90 tablet 1   irbesartan (AVAPRO) 75 MG tablet Take 1 tablet (75 mg total) by mouth daily. 30 tablet 5   nitroGLYCERIN (NITROSTAT) 0.4 MG SL tablet Place 1 tablet (0.4 mg total) under the tongue every 5 (five) minutes x 3 doses as needed for chest pain (if no relief after 2nd dose, proceed to ED or call 911). 25 tablet 3   Omega-3 Fatty Acids (OMEGA-3 FISH OIL PO) Take 1,000 mg by mouth in the morning.     omeprazole (PRILOSEC) 40 MG capsule Take 40 mg bid x 1 month then 40 mg once daily     rosuvastatin (CRESTOR) 20 MG tablet TAKE HALF A TABLET (10 MG) BY MOUTH ONCE DAILY. 45 tablet 3   sucralfate (CARAFATE) 1 g tablet Take 1 tablet (1 g total) by mouth 4 (four) times daily -  with meals and at bedtime. Crush and dissolve in 10 mL warm water prior to swallowing, take 20 min before meals 120 tablet 1   tamsulosin (FLOMAX) 0.4 MG CAPS capsule Take 0.4 mg by mouth in the morning.     Testosterone 20.25 MG/ACT (1.62%) GEL Apply 2 Pump topically in the morning.     vitamin C (ASCORBIC ACID) 500 MG tablet Take 500 mg by mouth in the morning.     No current facility-administered medications for this visit.    SURGICAL HISTORY:  Past Surgical History:  Procedure Laterality Date   Bone spur  2006   5th toe bilaterally   BRONCHIAL BIOPSY   07/02/2021   Procedure: BRONCHIAL BIOPSIES;  Surgeon: Leslye Peer, MD;  Location: [facility] ENDOSCOPY;  Service: Pulmonary;;   BRONCHIAL BRUSHINGS  07/02/2021   Procedure: BRONCHIAL BRUSHINGS;  Surgeon: Leslye Peer, MD;  Location: [facility] ENDOSCOPY;  Service: Pulmonary;;   BRONCHIAL NEEDLE ASPIRATION BIOPSY  07/02/2021   Procedure: BRONCHIAL NEEDLE ASPIRATION BIOPSIES;  Surgeon: Leslye Peer, MD;  Location: [facility] ENDOSCOPY;  Service: Pulmonary;;   COLONOSCOPY  2007   negative; Dr Jarold Motto   CORONARY ARTERY BYPASS GRAFT  2001   2 vessels   FIDUCIAL MARKER PLACEMENT  07/02/2021   Procedure: FIDUCIAL MARKER PLACEMENT;  Surgeon: Leslye Peer, MD;  Location: [facility] ENDOSCOPY;  Service: Pulmonary;;   fracture heel  1967   Bil/ due to fall off ladder   Fractured calcaneus  1969   bilaterally w/ ankle  fusion   HERNIA REPAIR  1984   right inguinal hernia   KNEE SURGERY Left    LOBECTOMY Right 11/13/2021   MENISECTOMY  2007   R medial, Dr. Ophelia Charter   PORTA CATH INSERTION     SHOULDER SURGERY  1994   left   UPPER GASTROINTESTINAL ENDOSCOPY  2007   GERD, Dr Jarold Motto   VASECTOMY     VIDEO BRONCHOSCOPY WITH RADIAL ENDOBRONCHIAL ULTRASOUND  07/02/2021   Procedure: VIDEO BRONCHOSCOPY WITH RADIAL ENDOBRONCHIAL ULTRASOUND;  Surgeon: Leslye Peer, MD;  Location: [facility] ENDOSCOPY;  Service: Pulmonary;;    REVIEW OF SYSTEMS:  Constitutional: positive for fatigue Eyes: negative Ears, nose, mouth, throat, and face: negative Respiratory: positive for cough and dyspnea on exertion Cardiovascular: negative Gastrointestinal: negative Genitourinary:negative Integument/breast: negative Hematologic/lymphatic: negative Musculoskeletal:negative Neurological: negative Behavioral/Psych: negative Endocrine: negative Allergic/Immunologic: negative   PHYSICAL EXAMINATION: General appearance: alert, cooperative, fatigued, and no distress Head: Normocephalic, without obvious abnormality, atraumatic Neck: no  adenopathy, no JVD, supple, symmetrical, trachea midline, and thyroid not enlarged, symmetric, no tenderness/mass/nodules Lymph nodes: Cervical, supraclavicular, and axillary nodes normal. Resp: clear to auscultation bilaterally Back: symmetric, no curvature. ROM normal. No CVA tenderness. Cardio: regular rate and rhythm, S1, S2 normal, no murmur, click, rub or gallop GI: soft, non-tender; bowel sounds normal; no masses,  no organomegaly Extremities: extremities normal, atraumatic, no cyanosis or edema Neurologic: Alert and oriented X 3, normal strength and tone. Normal symmetric reflexes. Normal coordination and gait  ECOG PERFORMANCE STATUS: 1 - Symptomatic but completely ambulatory  Blood pressure (!) 147/86, pulse 72, temperature 97.8 F (36.6 C), temperature source Oral, resp. rate 16, height 6' (1.829 m), weight 185 lb 3.2 oz (84 kg), SpO2 98%.  LABORATORY DATA: Lab Results  Component Value Date   WBC 3.7 (L) 10/10/2022   HGB 13.2 10/10/2022   HCT 39.2 10/10/2022   MCV 92.7 10/10/2022   PLT 133 (L) 10/10/2022      Chemistry      Component Value Date/Time   NA 140 10/10/2022 0854   K 4.0 10/10/2022 0854   CL 106 10/10/2022 0854   CO2 30 10/10/2022 0854   BUN 18 10/10/2022 0854   CREATININE 0.96 10/10/2022 0854   CREATININE 1.07 08/25/2019 0847      Component Value Date/Time   CALCIUM 9.4 10/10/2022 0854   ALKPHOS 58 10/10/2022 0854   AST 18 10/10/2022 0854   ALT 13 10/10/2022 0854   BILITOT 0.6 10/10/2022 0854       RADIOGRAPHIC STUDIES: CT CHEST ABDOMEN PELVIS W CONTRAST  Result Date: 10/15/2022 CLINICAL DATA:  Restaging non-small cell lung cancer. Previous immunotherapy. * Tracking Code: BO * EXAM: CT CHEST, ABDOMEN, AND PELVIS WITH CONTRAST TECHNIQUE: Multidetector CT imaging of the chest, abdomen and pelvis was performed following the standard protocol during bolus administration of intravenous contrast. RADIATION DOSE REDUCTION: This exam was performed  according to the departmental dose-optimization program which includes automated exposure control, adjustment of the mA and/or kV according to patient size and/or use of iterative reconstruction technique. CONTRAST:  OMNIPAQUE IOHEXOL 300 MG/ML  SOLN COMPARISON:  Images from outside PET-CT 01/18/2022. Chest CT 05/20/2022 and abdominopelvic CT 06/20/2022. FINDINGS: CT CHEST FINDINGS Cardiovascular: No acute vascular findings. Right IJ Port-A-Cath extends to the lower SVC level. There is atherosclerosis of the aorta, great vessels and coronary arteries. The heart size is normal. There is no pericardial effusion. Mediastinum/Nodes: There are no enlarged mediastinal, hilar or axillary lymph nodes. Grossly stable treatment changes around the  trachea and proximal right mainstem bronchus. The thyroid gland, trachea and esophagus demonstrate no significant findings. Lungs/Pleura: Status post right upper lobectomy. Treatment changes medially in the upper right hemithorax with architectural distortion and paramediastinal fibrosis. No evidence of local recurrence or suspicious nodularity. Underlying advanced centrilobular and paraseptal emphysema. Partially calcified subpleural nodularity posteriorly in the upper right hemithorax is unchanged. The patchy ground-glass opacities at the right lung base seen on the previous chest CT have resolved. Musculoskeletal/Chest wall: No chest wall mass or suspicious osseous findings. Previous median sternotomy. CT ABDOMEN AND PELVIS FINDINGS Hepatobiliary: The liver is normal in density without suspicious focal abnormality. No evidence of gallstones, gallbladder wall thickening or biliary dilatation. Pancreas: Unremarkable. No pancreatic ductal dilatation or surrounding inflammatory changes. Spleen: Normal in size without focal abnormality. Adrenals/Urinary Tract: Both adrenal glands appear normal. No evidence of urinary tract calculus, suspicious renal lesion or hydronephrosis.  Stable mild renal cortical scarring bilaterally and scattered subcentimeter cysts for which no specific follow-up imaging is recommended. The urinary bladder appears unremarkable for its degree of distention. Stomach/Bowel: No enteric contrast administered. The stomach appears unremarkable for its degree of distension. No evidence of bowel wall thickening, distention or surrounding inflammatory change. The appendix appears normal. Vascular/Lymphatic: There are no enlarged abdominal or pelvic lymph nodes. Diffuse aortic and branch vessel atherosclerosis with interval irregularity in the infrarenal aorta. No focal aneurysm or large vessel occlusion. Probable mild common iliac artery stenosis, greater on the left. Reproductive: The prostate gland remains moderately enlarged with protrusion of the median lobe into the bladder base. Other: No evidence of abdominal wall mass or hernia. No ascites or pneumoperitoneum. Musculoskeletal: No acute or significant osseous findings. Mild spondylosis. Unless specific follow-up recommendations are mentioned in the findings or impression sections, no imaging follow-up of any mentioned incidental findings is recommended. IMPRESSION: 1. Stable treatment changes in the medial right hemithorax without evidence of local recurrence or metastatic disease. 2. Interval resolution of previously demonstrated patchy ground-glass opacities at the right lung base, most consistent with a resolved inflammatory process. 3. No evidence of metastatic disease in the abdomen or pelvis. 4. Stable moderate prostatomegaly. 5. Aortic Atherosclerosis (ICD10-I70.0) and Emphysema (ICD10-J43.9). Electronically Signed   By: Carey Bullocks M.D.   On: 10/15/2022 14:02    ASSESSMENT AND PLAN: This is a very pleasant 76 years old white male with Stage IIIA (T1c, N 2, M0) adenocarcinoma in the right upper lobe as well as stage IA (T1c, N0, M0) squamous cell carcinoma involving the right upper lobe and stage IA  (T1b, N0, M0) adenocarcinoma involving the left upper lobe diagnosed in June 2023. The patient is status post the following treatment: 1) status post neoadjuvant systemic chemotherapy with carboplatin, paclitaxel and nivolumab for 3 cycles in [facility] followed by right upper lobectomy with lymph node dissection at [facility] under the care of Dr. Glenna Durand with the final pathology showing residual disease with synchronous adenocarcinoma as well as squamous cell carcinoma in the right upper lobe and multiple mediastinal lymph node involvement performed on November 13, 2021. 2)  evidence for disease recurrence with involvement of 4R lymphadenopathy consistent with metastatic adenocarcinoma on February 26, 2022 biopsy. Molecular studies showed no actionable mutations and PD-L1 expression was 15% in the adenocarcinoma in 2% in the squamous cell carcinoma. 3) Concurrent chemotherapy with carboplatin for AUC of 5 and Alimta 500 Mg/M2 every 3 weeks for 2 cycles during the course of radiotherapy.  Last dose was giving 04/08/2022. He tolerated his treatment  well except for the chemotherapy-induced odynophagia in addition to fatigue and sore throat. He started treatment with consolidation immunotherapy with Imfinzi 1500 Mg IV every 4 weeks status post 1 cycle.  This was discontinued secondary to intolerance and severe diarrhea managed by his gastroenterologist Dr. Russella Dar. The patient had repeat CT scan of the chest, abdomen and pelvis performed recently.  I personally and independently reviewed the scan and discussed the result with the patient today. His scan showed no concerning finding for disease progression.    Lung Cancer Patient previously treated with concurrent chemo radiation with carboplatin Alimta, followed by one cycle of consolidation immune therapy with Imfinzi, which was poorly tolerated and discontinued in May 2024. Currently on observation. Recent CT scan of chest, abdomen, and pelvis  showed no evidence of cancer growth or spread. -Continue observation. -Repeat CT scan in three months.  Fatigue Patient reports ongoing weakness and lack of stamina, but no significant weight loss. -Continue supportive care.  Dysphagia Patient reports discomfort and sensation of something in the throat when swallowing. Physical examination did not reveal any lumps in the submandibular area. -Continue monitoring symptoms.  Portacath Maintenance Portacath has not been flushed in 2-3 months. -Arrange for Portacath flush every 6-8 weeks. -Consider removal of Portacath after one or two more visits.   The patient was advised to call immediately if he has any other concerning symptoms in the interval. The patient voices understanding of current disease status and treatment options and is in agreement with the current care plan.  All questions were answered. The patient knows to call the clinic with any problems, questions or concerns. We can certainly see the patient much sooner if necessary.  The total time spent in the appointment was 30 minutes.  Disclaimer: This note was dictated with voice recognition software. Similar sounding words can inadvertently be transcribed and may not be corrected upon review.

## 2022-10-15 NOTE — Progress Notes (Signed)
Patrick Harris is here today for follow up post radiation to the lung.  Lung Side: Right, patient completed treatment on 05/06/22.  Does the patient complain of any of the following: Pain:No Shortness of breath w/wo exertion: Yes, mostly on exertion.  Cough: Productive cough Hemoptysis: No Pain with swallowing: Yes, patient reports tightness in throat.  Swallowing/choking concerns: No Appetite: Good Weight:  Wt Readings from Last 3 Encounters:  10/15/22 185 lb 6.4 oz (84.1 kg)  09/02/22 179 lb (81.2 kg)  07/24/22 176 lb 6 oz (80 kg)   Energy Level: Low  Post radiation skin Changes: No    Additional comments if applicable:   BP (!) 144/78 (BP Location: Left Arm, Patient Position: Sitting, Cuff Size: Normal)   Pulse 75   Temp 97.7 F (36.5 C)   Resp 20   Ht 6' (1.829 m)   Wt 185 lb 6.4 oz (84.1 kg)   SpO2 98%   BMI 25.14 kg/m

## 2022-10-22 ENCOUNTER — Telehealth: Payer: Self-pay | Admitting: Gastroenterology

## 2022-10-22 NOTE — Telephone Encounter (Signed)
Good afternoon Dr. Russella Dar,   We received a call from this patient wishing to schedule his colonoscopy. Patient has a recall in for 08/2021. He is now over the age of 66. Would you like to see patient in the office first or can he be scheduled directly? Please advise on scheduling  Thank you.

## 2022-10-22 NOTE — Telephone Encounter (Signed)
He is 53 and has a history of lung cancer. He needs an office visit to consider scheduling colonoscopy.

## 2022-10-29 ENCOUNTER — Ambulatory Visit (INDEPENDENT_AMBULATORY_CARE_PROVIDER_SITE_OTHER): Payer: Medicare Other | Admitting: Gastroenterology

## 2022-10-29 ENCOUNTER — Encounter: Payer: Self-pay | Admitting: Gastroenterology

## 2022-10-29 VITALS — BP 124/70 | HR 73 | Ht 72.0 in | Wt 186.0 lb

## 2022-10-29 DIAGNOSIS — Z8719 Personal history of other diseases of the digestive system: Secondary | ICD-10-CM

## 2022-10-29 DIAGNOSIS — K219 Gastro-esophageal reflux disease without esophagitis: Secondary | ICD-10-CM | POA: Diagnosis not present

## 2022-10-29 DIAGNOSIS — R1310 Dysphagia, unspecified: Secondary | ICD-10-CM

## 2022-10-29 DIAGNOSIS — Z860101 Personal history of adenomatous and serrated colon polyps: Secondary | ICD-10-CM

## 2022-10-29 MED ORDER — NA SULFATE-K SULFATE-MG SULF 17.5-3.13-1.6 GM/177ML PO SOLN: 1.0000 | Freq: Once | ORAL | 0 refills | Status: AC

## 2022-10-29 NOTE — Progress Notes (Signed)
Assessment     Dysphagia, odynophagia, GERD, history esophageal ulcer d/t radiation, 2024 - R/O stricture, persistent esoph ulcer, esophagitis, candida esophagitis Personal history of adenomatous colon polyps History of periumbilical abdominal pain - suspected Imfinzi side effect History of erosive gastritis, 2024 RUL adenocarcinoma and squamous cell carcinoma - post chemotherapy, radiation, immunotherapy, surgery and currently in observation  Recommendations    Schedule colonoscopy and EGD. The risks (including bleeding, perforation, infection, missed lesions, medication reactions and possible hospitalization or surgery if complications occur), benefits, and alternatives to colonoscopy with possible biopsy and possible polypectomy were discussed with the patient and they consent to proceed.  The risks (including bleeding, perforation, infection, missed lesions, medication reactions and possible hospitalization or surgery if complications occur), benefits, and alternatives to endoscopy with possible biopsy and possible dilation were discussed with the patient and they consent to proceed.   Continue omeprazole 40 mg bid and sucralfate 1 g qid crushed and dissolved in 10 cc water.   HPI    This is a 77 year old male with a personal history of adenomatous colon polyps.  He was recently evaluated by Dr. Shirline Frees, his oncologist, he is in observation status after completing multimodality therapy for right upper lobe lung cancer.  CT of the chest abdomen and pelvis dated October 8 as below.  He has persistent problems with dysphagia and odynophagia however these symptoms have improved since earlier this year.  His appetite is good and he is steadily gaining weight.  No other gastrointestinal complaints.  Colonoscopy Aug 2018 - Three 5 to 8 mm polyps in the descending colon and in the transverse colon, removed with a cold snare. Resected and retrieved.  - Diverticulosis in the sigmoid colon.  -  Internal hemorrhoids.  - The examination was otherwise normal on direct and retroflexion views.  EGD June 2024 - Esophageal ulcer with no bleeding and no stigmata of recent bleeding. Biopsied.  - Erosive gastropathy with no bleeding and no stigmata of recent bleeding. Biopsied.  - Small hiatal hernia.  - Normal duodenal bulb and second portion of the duodenum  Labs / Imaging       Latest Ref Rng & Units 10/10/2022    8:54 AM 09/02/2022    9:37 AM 07/10/2022   10:40 AM  Hepatic Function  Total Protein 6.5 - 8.1 g/dL 6.7  7.0  6.3   Albumin 3.5 - 5.0 g/dL 3.9  4.0  3.3   AST 15 - 41 U/L 18  17  15    ALT 0 - 44 U/L 13  16  15    Alk Phosphatase 38 - 126 U/L 58  73  58   Total Bilirubin 0.3 - 1.2 mg/dL 0.6  0.5  0.2        Latest Ref Rng & Units 10/10/2022    8:54 AM 09/02/2022    9:37 AM 07/10/2022   10:40 AM  CBC  WBC 4.0 - 10.5 K/uL 3.7  5.0  3.9   Hemoglobin 13.0 - 17.0 g/dL 46.9  62.9  52.8   Hematocrit 39.0 - 52.0 % 39.2  43.3  32.1   Platelets 150 - 400 K/uL 133  163.0  149      CT CHEST ABDOMEN PELVIS W CONTRAST CLINICAL DATA:  Restaging non-small cell lung cancer. Previous immunotherapy. * Tracking Code: BO *  EXAM: CT CHEST, ABDOMEN, AND PELVIS WITH CONTRAST  TECHNIQUE: Multidetector CT imaging of the chest, abdomen and pelvis was performed following the standard protocol  during bolus administration of intravenous contrast.  RADIATION DOSE REDUCTION: This exam was performed according to the departmental dose-optimization program which includes automated exposure control, adjustment of the mA and/or kV according to patient size and/or use of iterative reconstruction technique.  CONTRAST:  OMNIPAQUE IOHEXOL 300 MG/ML  SOLN  COMPARISON:  Images from outside PET-CT 01/18/2022. Chest CT 05/20/2022 and abdominopelvic CT 06/20/2022.  FINDINGS: CT CHEST FINDINGS  Cardiovascular: No acute vascular findings. Right IJ Port-A-Cath extends to the lower SVC level.  There is atherosclerosis of the aorta, great vessels and coronary arteries. The heart size is normal. There is no pericardial effusion.  Mediastinum/Nodes: There are no enlarged mediastinal, hilar or axillary lymph nodes. Grossly stable treatment changes around the trachea and proximal right mainstem bronchus. The thyroid gland, trachea and esophagus demonstrate no significant findings.  Lungs/Pleura: Status post right upper lobectomy. Treatment changes medially in the upper right hemithorax with architectural distortion and paramediastinal fibrosis. No evidence of local recurrence or suspicious nodularity. Underlying advanced centrilobular and paraseptal emphysema. Partially calcified subpleural nodularity posteriorly in the upper right hemithorax is unchanged. The patchy ground-glass opacities at the right lung base seen on the previous chest CT have resolved.  Musculoskeletal/Chest wall: No chest wall mass or suspicious osseous findings. Previous median sternotomy.  CT ABDOMEN AND PELVIS FINDINGS  Hepatobiliary: The liver is normal in density without suspicious focal abnormality. No evidence of gallstones, gallbladder wall thickening or biliary dilatation.  Pancreas: Unremarkable. No pancreatic ductal dilatation or surrounding inflammatory changes.  Spleen: Normal in size without focal abnormality.  Adrenals/Urinary Tract: Both adrenal glands appear normal. No evidence of urinary tract calculus, suspicious renal lesion or hydronephrosis. Stable mild renal cortical scarring bilaterally and scattered subcentimeter cysts for which no specific follow-up imaging is recommended. The urinary bladder appears unremarkable for its degree of distention.  Stomach/Bowel: No enteric contrast administered. The stomach appears unremarkable for its degree of distension. No evidence of bowel wall thickening, distention or surrounding inflammatory change. The appendix appears  normal.  Vascular/Lymphatic: There are no enlarged abdominal or pelvic lymph nodes. Diffuse aortic and branch vessel atherosclerosis with interval irregularity in the infrarenal aorta. No focal aneurysm or large vessel occlusion. Probable mild common iliac artery stenosis, greater on the left.  Reproductive: The prostate gland remains moderately enlarged with protrusion of the median lobe into the bladder base.  Other: No evidence of abdominal wall mass or hernia. No ascites or pneumoperitoneum.  Musculoskeletal: No acute or significant osseous findings. Mild spondylosis.  Unless specific follow-up recommendations are mentioned in the findings or impression sections, no imaging follow-up of any mentioned incidental findings is recommended.  IMPRESSION: 1. Stable treatment changes in the medial right hemithorax without evidence of local recurrence or metastatic disease. 2. Interval resolution of previously demonstrated patchy ground-glass opacities at the right lung base, most consistent with a resolved inflammatory process. 3. No evidence of metastatic disease in the abdomen or pelvis. 4. Stable moderate prostatomegaly. 5. Aortic Atherosclerosis (ICD10-I70.0) and Emphysema (ICD10-J43.9).  Electronically Signed   By: Carey Bullocks M.D.   On: 10/15/2022 14:02   Current Medications, Allergies, Past Medical History, Past Surgical History, Family History and Social History were reviewed in [employer] record.   Physical Exam: General: Well developed, well nourished, no acute distress Head: Normocephalic and atraumatic Eyes: Sclerae anicteric, EOMI Ears: Normal auditory acuity Mouth: No deformities or lesions noted Lungs: Clear throughout to auscultation Heart: Regular rate and rhythm; No murmurs, rubs or bruits Abdomen: Soft, non  tender and non distended. No masses, hepatosplenomegaly or hernias noted. Normal Bowel sounds Rectal: Deferred to  colonoscopy  Musculoskeletal: Symmetrical with no gross deformities  Pulses:  Normal pulses noted Extremities: No edema or deformities noted Neurological: Alert oriented x 4, grossly nonfocal Psychological:  Alert and cooperative. Normal mood and affect   Saaya Procell T. Russella Dar, MD 10/29/2022, 2:57 PM

## 2022-10-29 NOTE — Patient Instructions (Signed)
You have been scheduled for an endoscopy and colonoscopy. Please follow the written instructions given to you at your visit today.  Please pick up your prep supplies at the pharmacy within the next 1-3 days.  If you use inhalers (even only as needed), please bring them with you on the day of your procedure.  DO NOT TAKE 7 DAYS PRIOR TO TEST- Trulicity (dulaglutide) Ozempic, Wegovy (semaglutide) Mounjaro (tirzepatide) Bydureon Bcise (exanatide extended release)  DO NOT TAKE 1 DAY PRIOR TO YOUR TEST Rybelsus (semaglutide) Adlyxin (lixisenatide) Victoza (liraglutide) Byetta (exanatide) ___________________________________________________________________________  The [facility] providers would like to encourage you to use [facility] to communicate with providers for non-urgent requests or questions.  Due to long hold times on the telephone, sending your provider a message by [facility] may be a faster and more efficient way to get a response.  Please allow 48 business hours for a response.  Please remember that this is for non-urgent requests.   Due to recent changes in healthcare laws, you may see the results of your imaging and laboratory studies on MyChart before your provider has had a chance to review them.  We understand that in some cases there may be results that are confusing or concerning to you. Not all laboratory results come back in the same time frame and the provider may be waiting for multiple results in order to interpret others.  Please give [place] 48 hours in order for your provider to thoroughly review all the results before contacting the office for clarification of your results.   Thank you for choosing me and [facility].  Venita Lick. Pleas Koch., MD., Clementeen Graham

## 2022-11-12 DIAGNOSIS — M79674 Pain in right toe(s): Secondary | ICD-10-CM | POA: Diagnosis not present

## 2022-11-12 DIAGNOSIS — L03031 Cellulitis of right toe: Secondary | ICD-10-CM | POA: Diagnosis not present

## 2022-11-12 DIAGNOSIS — L600 Ingrowing nail: Secondary | ICD-10-CM | POA: Diagnosis not present

## 2022-11-12 DIAGNOSIS — M79671 Pain in right foot: Secondary | ICD-10-CM | POA: Diagnosis not present

## 2022-11-19 DIAGNOSIS — L299 Pruritus, unspecified: Secondary | ICD-10-CM | POA: Diagnosis not present

## 2022-11-26 ENCOUNTER — Inpatient Hospital Stay: Payer: Medicare Other | Attending: Internal Medicine

## 2022-11-26 DIAGNOSIS — C3412 Malignant neoplasm of upper lobe, left bronchus or lung: Secondary | ICD-10-CM | POA: Diagnosis not present

## 2022-11-26 DIAGNOSIS — Z95828 Presence of other vascular implants and grafts: Secondary | ICD-10-CM

## 2022-11-26 DIAGNOSIS — C3411 Malignant neoplasm of upper lobe, right bronchus or lung: Secondary | ICD-10-CM | POA: Insufficient documentation

## 2022-11-26 MED ORDER — HEPARIN SOD (PORK) LOCK FLUSH 100 UNIT/ML IV SOLN
500.0000 [IU] | Freq: Once | INTRAVENOUS | Status: AC
  Administered 2022-11-26: 500 [IU]

## 2022-11-26 MED ORDER — SODIUM CHLORIDE 0.9% FLUSH
10.0000 mL | Freq: Once | INTRAVENOUS | Status: AC
  Administered 2022-11-26: 10 mL

## 2022-11-27 DIAGNOSIS — H43813 Vitreous degeneration, bilateral: Secondary | ICD-10-CM | POA: Diagnosis not present

## 2022-11-27 DIAGNOSIS — H52202 Unspecified astigmatism, left eye: Secondary | ICD-10-CM | POA: Diagnosis not present

## 2022-11-27 DIAGNOSIS — Z961 Presence of intraocular lens: Secondary | ICD-10-CM | POA: Diagnosis not present

## 2022-11-27 DIAGNOSIS — D352 Benign neoplasm of pituitary gland: Secondary | ICD-10-CM | POA: Diagnosis not present

## 2022-11-28 ENCOUNTER — Other Ambulatory Visit: Payer: Self-pay | Admitting: Radiation Oncology

## 2022-11-28 ENCOUNTER — Telehealth: Payer: Self-pay

## 2022-11-28 DIAGNOSIS — C3491 Malignant neoplasm of unspecified part of right bronchus or lung: Secondary | ICD-10-CM

## 2022-11-28 DIAGNOSIS — M79672 Pain in left foot: Secondary | ICD-10-CM | POA: Diagnosis not present

## 2022-11-28 DIAGNOSIS — L110 Acquired keratosis follicularis: Secondary | ICD-10-CM | POA: Diagnosis not present

## 2022-11-28 DIAGNOSIS — M79674 Pain in right toe(s): Secondary | ICD-10-CM | POA: Diagnosis not present

## 2022-11-28 DIAGNOSIS — M79675 Pain in left toe(s): Secondary | ICD-10-CM | POA: Diagnosis not present

## 2022-11-28 DIAGNOSIS — M79671 Pain in right foot: Secondary | ICD-10-CM | POA: Diagnosis not present

## 2022-11-28 DIAGNOSIS — L609 Nail disorder, unspecified: Secondary | ICD-10-CM | POA: Diagnosis not present

## 2022-11-28 DIAGNOSIS — I739 Peripheral vascular disease, unspecified: Secondary | ICD-10-CM | POA: Diagnosis not present

## 2022-11-28 MED ORDER — SUCRALFATE 1 G PO TABS: 1.0000 g | ORAL_TABLET | Freq: Three times a day (TID) | ORAL | 2 refills | Status: DC

## 2022-11-28 NOTE — Telephone Encounter (Signed)
Patrick Harris called to request a refill on Sucralfate (Carafate) 1 g tablet. He said that he has ran out.

## 2022-11-29 ENCOUNTER — Encounter: Payer: Self-pay | Admitting: Gastroenterology

## 2022-12-12 DIAGNOSIS — L03032 Cellulitis of left toe: Secondary | ICD-10-CM | POA: Diagnosis not present

## 2022-12-12 DIAGNOSIS — M79675 Pain in left toe(s): Secondary | ICD-10-CM | POA: Diagnosis not present

## 2022-12-12 DIAGNOSIS — M79672 Pain in left foot: Secondary | ICD-10-CM | POA: Diagnosis not present

## 2022-12-12 DIAGNOSIS — L600 Ingrowing nail: Secondary | ICD-10-CM | POA: Diagnosis not present

## 2022-12-18 ENCOUNTER — Encounter: Payer: Self-pay | Admitting: Gastroenterology

## 2022-12-18 ENCOUNTER — Ambulatory Visit: Payer: Medicare Other | Admitting: Gastroenterology

## 2022-12-18 VITALS — BP 128/63 | HR 76 | Temp 97.3°F | Resp 19 | Ht 72.0 in | Wt 186.0 lb

## 2022-12-18 DIAGNOSIS — K222 Esophageal obstruction: Secondary | ICD-10-CM

## 2022-12-18 DIAGNOSIS — K449 Diaphragmatic hernia without obstruction or gangrene: Secondary | ICD-10-CM

## 2022-12-18 DIAGNOSIS — R1310 Dysphagia, unspecified: Secondary | ICD-10-CM

## 2022-12-18 DIAGNOSIS — Z1211 Encounter for screening for malignant neoplasm of colon: Secondary | ICD-10-CM

## 2022-12-18 DIAGNOSIS — K644 Residual hemorrhoidal skin tags: Secondary | ICD-10-CM | POA: Diagnosis not present

## 2022-12-18 DIAGNOSIS — K5730 Diverticulosis of large intestine without perforation or abscess without bleeding: Secondary | ICD-10-CM

## 2022-12-18 DIAGNOSIS — K640 First degree hemorrhoids: Secondary | ICD-10-CM

## 2022-12-18 DIAGNOSIS — I509 Heart failure, unspecified: Secondary | ICD-10-CM | POA: Diagnosis not present

## 2022-12-18 DIAGNOSIS — D123 Benign neoplasm of transverse colon: Secondary | ICD-10-CM

## 2022-12-18 DIAGNOSIS — Z860101 Personal history of adenomatous and serrated colon polyps: Secondary | ICD-10-CM | POA: Diagnosis not present

## 2022-12-18 DIAGNOSIS — I2510 Atherosclerotic heart disease of native coronary artery without angina pectoris: Secondary | ICD-10-CM | POA: Diagnosis not present

## 2022-12-18 DIAGNOSIS — J449 Chronic obstructive pulmonary disease, unspecified: Secondary | ICD-10-CM | POA: Diagnosis not present

## 2022-12-18 MED ORDER — SODIUM CHLORIDE 0.9 % IV SOLN: 500.0000 mL | Freq: Once | INTRAVENOUS | Status: DC

## 2022-12-18 NOTE — Progress Notes (Signed)
Sedate, gd SR, tolerated procedure well, VSS, report to RN 

## 2022-12-18 NOTE — Patient Instructions (Addendum)
- Clear liquid diet for 2 hours, then advance as tolerated to soft diet today. - Resume prior diet tomorrow. - Follow antireflux measuures. - Continue present medications including omeprazole 40 mg qd. - GI office appt in 2 months with Dr. Doy Hutching. - Resume previous diet, including high fiber  YOU HAD AN ENDOSCOPIC PROCEDURE TODAY AT THE [facility]:   Refer to the procedure report that was given to you for any specific questions about what was found during the examination.  If the procedure report does not answer your questions, please call your gastroenterologist to clarify.  If you requested that your care partner not be given the details of your procedure findings, then the procedure report has been included in a sealed envelope for you to review at your convenience later.  YOU SHOULD EXPECT: Some feelings of bloating in the abdomen. Passage of more gas than usual.  Walking can help get rid of the air that was put into your GI tract during the procedure and reduce the bloating. If you had a lower endoscopy (such as a colonoscopy or flexible sigmoidoscopy) you may notice spotting of blood in your stool or on the toilet paper. If you underwent a bowel prep for your procedure, you may not have a normal bowel movement for a few days.  Please Note:  You might notice some irritation and congestion in your nose or some drainage.  This is from the oxygen used during your procedure.  There is no need for concern and it should clear up in a day or so.  SYMPTOMS TO REPORT IMMEDIATELY:  Following lower endoscopy (colonoscopy or flexible sigmoidoscopy):  Excessive amounts of blood in the stool  Significant tenderness or worsening of abdominal pains  Swelling of the abdomen that is new, acute  Fever of 100F or higher  Following upper endoscopy (EGD)  Vomiting of blood or coffee ground material  New chest pain or pain under the shoulder blades  Painful or persistently difficult  swallowing  New shortness of breath  Fever of 100F or higher  Black, tarry-looking stools  For urgent or emergent issues, a gastroenterologist can be reached at any hour by calling [phone]. Do not use MyChart messaging for urgent concerns.    DIET:  We do recommend a small meal at first, but then you may proceed to your regular diet.  Drink plenty of fluids but you should avoid alcoholic beverages for 24 hours.  ACTIVITY:  You should plan to take it easy for the rest of today and you should NOT DRIVE or use heavy machinery until tomorrow (because of the sedation medicines used during the test).    FOLLOW UP: Our staff will call the number listed on your records the next business day following your procedure.  We will call around 7:15- 8:00 am to check on you and address any questions or concerns that you may have regarding the information given to you following your procedure. If we do not reach you, we will leave a message.     If any biopsies were taken you will be contacted by phone or by letter within the next 1-3 weeks.  Please call us at [phone] if you have not heard about the biopsies in 3 weeks.    SIGNATURES/CONFIDENTIALITY: You and/or your care partner have signed paperwork which will be entered into your electronic medical record.  These signatures attest to the fact that that the information above on your After Visit Summary has been  reviewed and is understood.  Full responsibility of the confidentiality of this discharge information lies with you and/or your care-partner.

## 2022-12-18 NOTE — Op Note (Signed)
[facility] Patient Name: Patrick Harris Procedure Date: 12/18/2022 9:02 AM MRN: [id number] Endoscopist: Meryl Dare , MD, [phone] Age: 77 Referring MD:  Date of Birth: [birth year] Gender: Male Account #: [id number] Procedure:                Colonoscopy Indications:              Surveillance: Personal history of adenomatous                            polyps on last colonoscopy > 5 years ago Medicines:                Monitored Anesthesia Care Procedure:                Pre-Anesthesia Assessment:                           - Prior to the procedure, a History and Physical                            was performed, and patient medications and                            allergies were reviewed. The patient's tolerance of                            previous anesthesia was also reviewed. The risks                            and benefits of the procedure and the sedation                            options and risks were discussed with the patient.                            All questions were answered, and informed consent                            was obtained. Prior Anticoagulants: The patient has                            taken no anticoagulant or antiplatelet agents. ASA                            Grade Assessment: III - A patient with severe                            systemic disease. After reviewing the risks and                            benefits, the patient was deemed in satisfactory                            condition to undergo the procedure.  After obtaining informed consent, the colonoscope                            was passed under direct vision. Throughout the                            procedure, the patient's blood pressure, pulse, and                            oxygen saturations were monitored continuously. The                            CF [id number] #[id number] was introduced through the anus                            and advanced to  the the cecum, identified by                            appendiceal orifice and ileocecal valve. The                            ileocecal valve, appendiceal orifice, and rectum                            were photographed. The quality of the bowel                            preparation was good. The colonoscopy was performed                            without difficulty. The patient tolerated the                            procedure well. Scope In: 9:06:37 AM Scope Out: 9:23:58 AM Scope Withdrawal Time: 0 hours 14 minutes 21 seconds  Total Procedure Duration: 0 hours 17 minutes 21 seconds  Findings:                 The perianal and digital rectal examinations were                            normal.                           Three sessile polyps were found in the transverse                            colon. The polyps were 5 to 6 mm in size. These                            polyps were removed with a cold snare. Resection                            and retrieval were complete.  Multiple medium-mouthed and small-mouthed                            diverticula were found in the left colon. There was                            no evidence of diverticular bleeding.                           External and internal hemorrhoids were found during                            retroflexion. The hemorrhoids were moderate and                            Grade I (internal hemorrhoids that do not prolapse).                           The exam was otherwise without abnormality on                            direct and retroflexion views. Complications:            No immediate complications. Estimated blood loss:                            None. Estimated Blood Loss:     Estimated blood loss: none. Impression:               - Three 5 to 6 mm polyps in the transverse colon,                            removed with a cold snare. Resected and retrieved.                           - Mild  diverticulosis in the left colon.                           - External and internal hemorrhoids.                           - The examination was otherwise normal on direct                            and retroflexion views. Recommendation:           - Repeat colonoscopy vs no repeat due to age after                            studies are complete for surveillance based on                            pathology results.                           - Patient has a contact number  available for                            emergencies. The signs and symptoms of potential                            delayed complications were discussed with the                            patient. Return to normal activities tomorrow.                            Written discharge instructions were provided to the                            patient.                           - Resume previous diet adding high fiber.                           - Continue present medications.                           - Await pathology results. Meryl Dare, MD 12/18/2022 9:36:29 AM This report has been signed electronically.

## 2022-12-18 NOTE — Op Note (Signed)
[facility] Patient Name: Patrick Harris Procedure Date: 12/18/2022 9:01 AM MRN: [id number] Endoscopist: Meryl Dare , MD, [phone] Age: 77 Referring MD:  Date of Birth: July 16, 1945 Gender: Male Account #: [id number] Procedure:                Upper GI endoscopy Indications:              Dysphagia, Odynophagia Medicines:                Monitored Anesthesia Care Procedure:                Pre-Anesthesia Assessment:                           - Prior to the procedure, a History and Physical                            was performed, and patient medications and                            allergies were reviewed. The patient's tolerance of                            previous anesthesia was also reviewed. The risks                            and benefits of the procedure and the sedation                            options and risks were discussed with the patient.                            All questions were answered, and informed consent                            was obtained. Prior Anticoagulants: The patient has                            taken no anticoagulant or antiplatelet agents. ASA                            Grade Assessment: III - A patient with severe                            systemic disease. After reviewing the risks and                            benefits, the patient was deemed in satisfactory                            condition to undergo the procedure.                           After obtaining informed consent, the endoscope was  passed under direct vision. Throughout the                            procedure, the patient's blood pressure, pulse, and                            oxygen saturations were monitored continuously. The                            Olympus Scope SN [id number] was introduced through the                            mouth, and advanced to the second part of duodenum.                            The upper GI  endoscopy was accomplished without                            difficulty. The patient tolerated the procedure                            well. Scope In: Scope Out: Findings:                 One benign-appearing, intrinsic mild stenosis was                            found 26 cm from the incisors. This stenosis                            measured 1.3 cm (inner diameter) x 1 cm (in                            length). The stenosis was traversed. A guidewire                            was placed and the scope was withdrawn. Dilations                            were performed with Savary dilators with mild                            resistance at 15 mm, 16 mm and 17 mm. No heme noted.                           One benign-appearing, intrinsic mild stenosis was                            found at the gastroesophageal junction. This                            stenosis measured 1.5 cm (inner diameter) x less  than one cm (in length). The stenosis was                            traversed. Dilation as above.                           The exam of the esophagus was otherwise normal.                           A small hiatal hernia was present.                           The gastroesophageal flap valve was visualized                            endoscopically and classified as Hill Grade II                            (fold present, opens with respiration).                           The exam of the stomach was otherwise normal.                           The duodenal bulb and second portion of the                            duodenum were normal. Complications:            No immediate complications. Estimated Blood Loss:     Estimated blood loss: none. Impression:               - Benign-appearing esophageal stenosis. Dilated.                           - Benign-appearing esophageal stenosis. Dilated                           - Small hiatal hernia.                           -  Gastroesophageal flap valve classified as Hill                            Grade II (fold present, opens with respiration).                           - Normal duodenal bulb and second portion of the                            duodenum.                           - No specimens collected. Recommendation:           - Patient has a contact number available for  emergencies. The signs and symptoms of potential                            delayed complications were discussed with the                            patient. Return to normal activities tomorrow.                            Written discharge instructions were provided to the                            patient.                           - Clear liquid diet for 2 hours, then advance as                            tolerated to soft diet today.                           - Resume prior diet tomorrow.                           - Follow antireflux measuures.                           - Continue present medications including omeprazole                            40 mg qd.                           - GI office appt in 2 months with Dr. Doy Hutching. Meryl Dare, MD 12/18/2022 9:42:07 AM This report has been signed electronically.

## 2022-12-18 NOTE — Progress Notes (Signed)
History & Physical  Primary Care Physician:  [name], MD Primary Gastroenterologist: Claudette Head, MD  Impression / Plan:  Dysphagia, odynophagia, personal history of adenomatous colon polyps for EGD and colonoscopy.  CHIEF COMPLAINT: Dysphagia, odynophagia, Personal history of colon polyps   HPI: Patrick Harris is a 77 y.o. male with dysphagia, odynophagia, personal history of adenomatous colon polyps for EGD and colonoscopy.    Past Medical History:  Diagnosis Date   Arthritis    CAD (coronary artery disease)    CABG with LIMA to LAD and RIMA to RCA August 2000 - Dr. Dorris Fetch   Cancer [facility])    basal cell   CHF (congestive heart failure) (HCC)    COPD (chronic obstructive pulmonary disease) (HCC)    Elevated glycated hemoglobin    Essential hypertension    GERD (gastroesophageal reflux disease)    Heart murmur    History of radiation therapy    Right lung(chest) 03/19/22-05/06/22- Dr. Antony Blackbird   Hyperlipidemia    Hypopituitarism [facility])    Lung cancer (HCC)    Osteopenia    Dr Juleen China   Pancreatitis    Peyronie disease    Testosterone deficiency    Dr Juleen China    Past Surgical History:  Procedure Laterality Date   Bone spur  2006   5th toe bilaterally   BRONCHIAL BIOPSY  07/02/2021   Procedure: BRONCHIAL BIOPSIES;  Surgeon: Leslye Peer, MD;  Location: [facility] ENDOSCOPY;  Service: Pulmonary;;   BRONCHIAL BRUSHINGS  07/02/2021   Procedure: BRONCHIAL BRUSHINGS;  Surgeon: Leslye Peer, MD;  Location: [facility] ENDOSCOPY;  Service: Pulmonary;;   BRONCHIAL NEEDLE ASPIRATION BIOPSY  07/02/2021   Procedure: BRONCHIAL NEEDLE ASPIRATION BIOPSIES;  Surgeon: Leslye Peer, MD;  Location: [facility] ENDOSCOPY;  Service: Pulmonary;;   COLONOSCOPY  2007   negative; Dr Jarold Motto   CORONARY ARTERY BYPASS GRAFT  2001   2 vessels   FIDUCIAL MARKER PLACEMENT  07/02/2021   Procedure: FIDUCIAL MARKER PLACEMENT;  Surgeon: Leslye Peer, MD;  Location: [facility] ENDOSCOPY;  Service:  Pulmonary;;   fracture heel  1967   Bil/ due to fall off ladder   Fractured calcaneus  1969   bilaterally w/ ankle fusion   HERNIA REPAIR  1984   right inguinal hernia   KNEE SURGERY Left    LOBECTOMY Right 11/13/2021   MENISECTOMY  2007   R medial, Dr. Charmayne Sheer CATH INSERTION     SHOULDER SURGERY  1994   left   UPPER GASTROINTESTINAL ENDOSCOPY  2007   GERD, Dr Jarold Motto   VASECTOMY     VIDEO BRONCHOSCOPY WITH RADIAL ENDOBRONCHIAL ULTRASOUND  07/02/2021   Procedure: VIDEO BRONCHOSCOPY WITH RADIAL ENDOBRONCHIAL ULTRASOUND;  Surgeon: Leslye Peer, MD;  Location: [facility] ENDOSCOPY;  Service: Pulmonary;;    Prior to Admission medications   Medication Sig Start Date End Date Taking? Authorizing Provider  aspirin EC 81 MG tablet Take 1 tablet (81 mg total) by mouth at bedtime. Okay to restart this medication on 07/03/2021 07/02/21  Yes Leslye Peer, MD  Cholecalciferol (VITAMIN D3) 2000 UNITS TABS Take 2,000 Units by mouth in the morning and at bedtime.   Yes [provider]  Coenzyme Q10 (COQ-10) 100 MG CAPS Take 100 mg by mouth in the morning.   Yes [provider]  finasteride (PROSCAR) 5 MG tablet Take 5 mg by mouth daily. 08/20/22  Yes [provider]  irbesartan (AVAPRO) 75 MG tablet Take 1 tablet (  75 mg total) by mouth daily. 10/08/22  Yes Burns, Bobette Mo, MD  Omega-3 Fatty Acids (OMEGA-3 FISH OIL PO) Take 1,000 mg by mouth in the morning.   Yes [provider]  omeprazole (PRILOSEC) 40 MG capsule Take 40 mg bid x 1 month then 40 mg once daily 07/05/22  Yes Burns, Bobette Mo, MD  rosuvastatin (CRESTOR) 20 MG tablet TAKE HALF A TABLET (10 MG) BY MOUTH ONCE DAILY. 09/05/22  Yes Burns, Bobette Mo, MD  sucralfate (CARAFATE) 1 g tablet Take 1 tablet (1 g total) by mouth 4 (four) times daily -  with meals and at bedtime. Crush and dissolve in 10 mL warm water prior to swallowing, take 20 min before meals 11/28/22  Yes Antony Blackbird, MD  tamsulosin (FLOMAX) 0.4  MG CAPS capsule Take 0.4 mg by mouth in the morning.   Yes [provider]  Testosterone 20.25 MG/ACT (1.62%) GEL Apply 2 Pump topically in the morning. 07/27/19  Yes [provider]  vitamin C (ASCORBIC ACID) 500 MG tablet Take 500 mg by mouth in the morning.   Yes [provider]  nitroGLYCERIN (NITROSTAT) 0.4 MG SL tablet Place 1 tablet (0.4 mg total) under the tongue every 5 (five) minutes x 3 doses as needed for chest pain (if no relief after 2nd dose, proceed to ED or call 911). 02/04/22   Jonelle Sidle, MD    Current Outpatient Medications  Medication Sig Dispense Refill   aspirin EC 81 MG tablet Take 1 tablet (81 mg total) by mouth at bedtime. Okay to restart this medication on 07/03/2021 30 tablet 11   Cholecalciferol (VITAMIN D3) 2000 UNITS TABS Take 2,000 Units by mouth in the morning and at bedtime.     Coenzyme Q10 (COQ-10) 100 MG CAPS Take 100 mg by mouth in the morning.     finasteride (PROSCAR) 5 MG tablet Take 5 mg by mouth daily.     irbesartan (AVAPRO) 75 MG tablet Take 1 tablet (75 mg total) by mouth daily. 30 tablet 5   Omega-3 Fatty Acids (OMEGA-3 FISH OIL PO) Take 1,000 mg by mouth in the morning.     omeprazole (PRILOSEC) 40 MG capsule Take 40 mg bid x 1 month then 40 mg once daily     rosuvastatin (CRESTOR) 20 MG tablet TAKE HALF A TABLET (10 MG) BY MOUTH ONCE DAILY. 45 tablet 3   sucralfate (CARAFATE) 1 g tablet Take 1 tablet (1 g total) by mouth 4 (four) times daily -  with meals and at bedtime. Crush and dissolve in 10 mL warm water prior to swallowing, take 20 min before meals 120 tablet 2   tamsulosin (FLOMAX) 0.4 MG CAPS capsule Take 0.4 mg by mouth in the morning.     Testosterone 20.25 MG/ACT (1.62%) GEL Apply 2 Pump topically in the morning.     vitamin C (ASCORBIC ACID) 500 MG tablet Take 500 mg by mouth in the morning.     nitroGLYCERIN (NITROSTAT) 0.4 MG SL tablet Place 1 tablet (0.4 mg total) under the tongue every 5 (five)  minutes x 3 doses as needed for chest pain (if no relief after 2nd dose, proceed to ED or call 911). 25 tablet 3   Current Facility-Administered Medications  Medication Dose Route Frequency Provider Last Rate Last Admin   0.9 %  sodium chloride infusion  500 mL Intravenous Once Meryl Dare, MD        Allergies as of 12/18/2022 - Review Complete  12/18/2022  Allergen Reaction Noted   Latex Dermatitis    Bromocriptine Nausea Only 06/27/2021   Zestril [lisinopril] Other (See Comments) 11/22/2019   Diovan [valsartan] Nausea Only 04/20/2021   Hydrochlorothiazide Other (See Comments) 11/21/2016   Norvasc [amlodipine] Other (See Comments) 08/13/2012   Zocor [simvastatin]      Family History  Problem Relation Age of Onset   Heart attack Father 33   Diabetes Mother    Stroke Mother 67   Hypertension Mother    Leukemia Sister    Colon cancer Paternal Uncle    Aortic aneurysm Brother        abdominal   Heart disease Brother    Atrial fibrillation Brother    Cancer Maternal Uncle         X3:prostate , renal, bone    Parkinson's disease Sister    Stroke Sister    Stroke Sister    Stroke Sister    Atrial fibrillation Sister    Esophageal cancer Neg Hx    Rectal cancer Neg Hx    Stomach cancer Neg Hx     Social History   Socioeconomic History   Marital status: Married    Spouse name: Not on file   Number of children: 1   Years of education: Not on file   Highest education level: Not on file  Occupational History   Occupation: retired  Tobacco Use   Smoking status: Former    Current packs/day: 0.00    Types: Cigarettes    Quit date: 05/2021    Years since quitting: 1.6   Smokeless tobacco: Never  Vaping Use   Vaping status: Never Used  Substance and Sexual Activity   Alcohol use: No   Drug use: No   Sexual activity: Yes  Other Topics Concern   Not on file  Social History Narrative   Patients daughter passed away at 79 from Breast Cancer.       Right Handed     Lives in two story home    Social Determinants of Health   Financial Resource Strain: Low Risk  (09/24/2021)   Overall Financial Resource Strain (CARDIA)    Difficulty of Paying Living Expenses: Not hard at all  Food Insecurity: Low Risk  (11/19/2022)   Received from [facility]   Hunger Vital Sign    Worried About Running Out of Food in the Last Year: Never true    Ran Out of Food in the Last Year: Never true  Transportation Needs: No Transportation Needs (11/19/2022)   Received from [facility]    In the past 12 months, has lack of reliable transportation kept you from medical appointments, meetings, work or from getting things needed for daily living? : No  Physical Activity: Insufficiently Active (09/24/2021)   Exercise Vital Sign    Days of Exercise per Week: 3 days    Minutes of Exercise per Session: 30 min  Stress: No Stress Concern Present (09/24/2021)   [employer] of Occupational Health - Occupational Stress Questionnaire    Feeling of Stress : Not at all  Social Connections: Moderately Integrated (09/24/2021)   Social Connection and Isolation Panel [NHANES]    Frequency of Communication with Friends and Family: More than three times a week    Frequency of Social Gatherings with Friends and Family: More than three times a week    Attends Religious Services: More than 4 times per year    Active Member of Clubs or Organizations: No  Attends Banker Meetings: Never    Marital Status: Married  Catering manager Violence: Not At Risk (09/24/2021)   Humiliation, Afraid, Rape, and Kick questionnaire    Fear of Current or Ex-Partner: No    Emotionally Abused: No    Physically Abused: No    Sexually Abused: No    Review of Systems:  All systems reviewed were negative except where noted in HPI.   Physical Exam:  General:  Alert, well-developed, in NAD Head:  Normocephalic and atraumatic. Eyes:  Sclera clear, no icterus.    Conjunctiva pink. Ears:  Normal auditory acuity. Mouth:  No deformity or lesions.  Neck:  Supple; no masses. Lungs:  Clear throughout to auscultation.   No wheezes, crackles, or rhonchi.  Heart:  Regular rate and rhythm; no murmurs. Abdomen:  Soft, nondistended, nontender. No masses, hepatomegaly. No palpable masses.  Normal bowel sounds.    Rectal:  Deferred   Msk:  Symmetrical without gross deformities. Extremities:  Without edema. Neurologic:  Alert and  oriented x 4; grossly normal neurologically. Skin:  Intact without significant lesions or rashes. Psych:  Alert and cooperative. Normal mood and affect.   Venita Lick. Russella Dar  12/18/2022, 8:56 AM See Loretha Stapler, [facility], to contact our on call provider

## 2022-12-19 ENCOUNTER — Telehealth: Payer: Self-pay

## 2022-12-19 NOTE — Telephone Encounter (Signed)
Follow up call to pt, lm for pt to call if having any difficulty with normal activities or eating and drinking.  Also to call if any other questions or concerns.  

## 2022-12-20 LAB — SURGICAL PATHOLOGY

## 2023-01-02 ENCOUNTER — Encounter: Payer: Self-pay | Admitting: Gastroenterology

## 2023-01-06 ENCOUNTER — Other Ambulatory Visit (HOSPITAL_COMMUNITY): Payer: Medicare Other

## 2023-01-06 ENCOUNTER — Other Ambulatory Visit: Payer: Self-pay | Admitting: Internal Medicine

## 2023-01-07 ENCOUNTER — Ambulatory Visit (HOSPITAL_COMMUNITY)
Admission: RE | Admit: 2023-01-07 | Discharge: 2023-01-07 | Disposition: A | Discharge status: Home / Self Care | Payer: Medicare Other | Source: Ambulatory Visit | Attending: Internal Medicine | Admitting: Internal Medicine

## 2023-01-07 ENCOUNTER — Inpatient Hospital Stay: Payer: Medicare Other | Attending: Internal Medicine

## 2023-01-07 ENCOUNTER — Encounter (HOSPITAL_COMMUNITY): Payer: Self-pay

## 2023-01-07 DIAGNOSIS — Z95828 Presence of other vascular implants and grafts: Secondary | ICD-10-CM

## 2023-01-07 DIAGNOSIS — N400 Enlarged prostate without lower urinary tract symptoms: Secondary | ICD-10-CM | POA: Insufficient documentation

## 2023-01-07 DIAGNOSIS — M545 Low back pain, unspecified: Secondary | ICD-10-CM | POA: Diagnosis not present

## 2023-01-07 DIAGNOSIS — C3490 Malignant neoplasm of unspecified part of unspecified bronchus or lung: Secondary | ICD-10-CM | POA: Insufficient documentation

## 2023-01-07 DIAGNOSIS — C3411 Malignant neoplasm of upper lobe, right bronchus or lung: Secondary | ICD-10-CM | POA: Diagnosis not present

## 2023-01-07 DIAGNOSIS — C3412 Malignant neoplasm of upper lobe, left bronchus or lung: Secondary | ICD-10-CM | POA: Diagnosis not present

## 2023-01-07 DIAGNOSIS — R059 Cough, unspecified: Secondary | ICD-10-CM | POA: Insufficient documentation

## 2023-01-07 DIAGNOSIS — J479 Bronchiectasis, uncomplicated: Secondary | ICD-10-CM | POA: Insufficient documentation

## 2023-01-07 DIAGNOSIS — R079 Chest pain, unspecified: Secondary | ICD-10-CM | POA: Insufficient documentation

## 2023-01-07 DIAGNOSIS — I700 Atherosclerosis of aorta: Secondary | ICD-10-CM | POA: Diagnosis not present

## 2023-01-07 DIAGNOSIS — K8689 Other specified diseases of pancreas: Secondary | ICD-10-CM | POA: Insufficient documentation

## 2023-01-07 DIAGNOSIS — K5730 Diverticulosis of large intestine without perforation or abscess without bleeding: Secondary | ICD-10-CM | POA: Diagnosis not present

## 2023-01-07 LAB — CBC WITH DIFFERENTIAL (CANCER CENTER ONLY)
Abs Immature Granulocytes: 0.02 10*3/uL (ref 0.00–0.07)
Basophils Absolute: 0 10*3/uL (ref 0.0–0.1)
Basophils Relative: 1 %
Eosinophils Absolute: 0.1 10*3/uL (ref 0.0–0.5)
Eosinophils Relative: 3 %
HCT: 38.6 % — ABNORMAL LOW (ref 39.0–52.0)
Hemoglobin: 13.2 g/dL (ref 13.0–17.0)
Immature Granulocytes: 1 %
Lymphocytes Relative: 16 %
Lymphs Abs: 0.6 10*3/uL — ABNORMAL LOW (ref 0.7–4.0)
MCH: 31.9 pg (ref 26.0–34.0)
MCHC: 34.2 g/dL (ref 30.0–36.0)
MCV: 93.2 fL (ref 80.0–100.0)
Monocytes Absolute: 0.5 10*3/uL (ref 0.1–1.0)
Monocytes Relative: 12 %
Neutro Abs: 2.7 10*3/uL (ref 1.7–7.7)
Neutrophils Relative %: 67 %
Platelet Count: 151 10*3/uL (ref 150–400)
RBC: 4.14 MIL/uL — ABNORMAL LOW (ref 4.22–5.81)
RDW: 13.2 % (ref 11.5–15.5)
WBC Count: 3.9 10*3/uL — ABNORMAL LOW (ref 4.0–10.5)
nRBC: 0 % (ref 0.0–0.2)

## 2023-01-07 LAB — CMP (CANCER CENTER ONLY)
ALT: 16 U/L (ref 0–44)
AST: 17 U/L (ref 15–41)
Albumin: 3.8 g/dL (ref 3.5–5.0)
Alkaline Phosphatase: 60 U/L (ref 38–126)
Anion gap: 4 — ABNORMAL LOW (ref 5–15)
BUN: 18 mg/dL (ref 8–23)
CO2: 29 mmol/L (ref 22–32)
Calcium: 9.2 mg/dL (ref 8.9–10.3)
Chloride: 107 mmol/L (ref 98–111)
Creatinine: 1 mg/dL (ref 0.61–1.24)
GFR, Estimated: >60 mL/min (ref >60)
Glucose, Bld: 122 mg/dL — ABNORMAL HIGH (ref 70–99)
Potassium: 3.9 mmol/L (ref 3.5–5.1)
Sodium: 140 mmol/L (ref 135–145)
Total Bilirubin: 0.6 mg/dL (ref 0.0–1.2)
Total Protein: 6.6 g/dL (ref 6.5–8.1)

## 2023-01-07 MED ORDER — SODIUM CHLORIDE 0.9% FLUSH
10.0000 mL | Freq: Once | INTRAVENOUS | Status: AC
  Administered 2023-01-07: 10 mL

## 2023-01-07 MED ORDER — IOHEXOL 300 MG/ML  SOLN
100.0000 mL | Freq: Once | INTRAMUSCULAR | Status: AC | PRN
Start: 2023-01-07 — End: 2023-01-07
  Administered 2023-01-07: 100 mL via INTRAVENOUS

## 2023-01-07 MED ORDER — HEPARIN SOD (PORK) LOCK FLUSH 100 UNIT/ML IV SOLN
500.0000 [IU] | Freq: Once | INTRAVENOUS | Status: AC
Start: 2023-01-07 — End: 2023-01-07
  Administered 2023-01-07: 500 [IU] via INTRAVENOUS

## 2023-01-16 DIAGNOSIS — Z803 Family history of malignant neoplasm of breast: Secondary | ICD-10-CM | POA: Diagnosis not present

## 2023-01-16 DIAGNOSIS — E221 Hyperprolactinemia: Secondary | ICD-10-CM | POA: Diagnosis not present

## 2023-01-16 DIAGNOSIS — D352 Benign neoplasm of pituitary gland: Secondary | ICD-10-CM | POA: Diagnosis not present

## 2023-01-16 DIAGNOSIS — E230 Hypopituitarism: Secondary | ICD-10-CM | POA: Diagnosis not present

## 2023-01-16 DIAGNOSIS — D649 Anemia, unspecified: Secondary | ICD-10-CM | POA: Diagnosis not present

## 2023-01-16 DIAGNOSIS — N62 Hypertrophy of breast: Secondary | ICD-10-CM | POA: Diagnosis not present

## 2023-01-20 ENCOUNTER — Telehealth: Payer: Self-pay

## 2023-01-20 NOTE — Telephone Encounter (Signed)
 Patient rescheduled for 1/15 to review scan results.  Patient verbalized happiness.

## 2023-01-20 NOTE — Telephone Encounter (Signed)
 Spoke with patient this morning about CT scan results.  Pt stated that he was reading the results on mychart and had some questions about the recommendation of a PET scan.  Per Dr. Sherrod-  pt needs to be seen to go over results within the next week and further follow up will be discussed then.  Made appt for 1/22 with Cassie, PA. Pt verbalized understanding.

## 2023-01-20 NOTE — Progress Notes (Signed)
 Patrick Harris  Patrick Dauphin, MD [address] [place] [address]  DIAGNOSIS: Stage IIIA (T1c, N 2, M0) adenocarcinoma in the right upper lobe as well as stage IA (T1c, N0, M0) squamous cell carcinoma involving the right upper lobe and stage IA (T1b, N0, M0) adenocarcinoma involving the left upper lobe diagnosed in June 2023.   PRIOR THERAPY: 1) status post neoadjuvant systemic chemotherapy with carboplatin , paclitaxel  and nivolumab  for 3 cycles in [place]  followed by right upper lobectomy with lymph node dissection at [facility] under the care of Dr. Vickey Grammes with the final pathology showing residual disease with synchronous adenocarcinoma as well as squamous cell carcinoma in the right upper lobe and multiple mediastinal lymph node involvement performed on November 13, 2021. 2)  evidence for disease recurrence with involvement of 4R lymphadenopathy consistent with metastatic adenocarcinoma on February 26, 2022 biopsy. Molecular studies showed no actionable mutations and PD-L1 expression was 15% in the adenocarcinoma in 2% in the squamous cell carcinoma. 3) Concurrent chemotherapy with carboplatin  for AUC of 5 and Alimta 500 Mg/M2 every 3 weeks for 2 cycles during the course of radiotherapy.  Last dose was giving 04/08/2022. 4) Consolidation treatment with immunotherapy with Imfinzi  1500 Mg IV every 4 weeks.  First dose May 29, 2022 status post 1 cycle.  This treatment was discontinued secondary to intolerance  CURRENT THERAPY: Observation   INTERVAL HISTORY: Patrick Harris 78 y.o. male returns to the clinic today for a follow-up visit.  The patient was last seen by Dr. Marguerita Shih on 10/15/2022. The patient is currently on observation.  He did undergo 1 cycle of immunotherapy in the spring 2024 but this was discontinued due to intolerance. At the patient's last appointment with Dr. Marguerita Shih he was endorsing continued fatigue and weakness.  He also  was having a burning sensation in his submandibular area.  In the interval since last being seen he did have a EGD and colonoscopy by Dr. Sandrea Cruel.  His EGD showed benign stenosis and he is status post dilation.  His colonoscopy showed a couple polyps and internal/external hemorrhoids.  The patient has a lot of continued/similar symptoms.  He continues to have a lot of fatigue and generalized weakness.  He also had a sharp right sided rib pain that lasted few seconds and then resolved spontaneously.  He also had a prior skin cancer on his left temporal area and sometimes he states this area feels abnormal.  He also continues to have intermittent umbilical pain.  Today he denies any fever, chills, or night sweats. He does report he sometimes sweats under his arm. He reports a good appetite.  He reports dyspnea on exertion but states it is stable.  He may have an intermittent cough based on when he feels like he has phlegm in his throat.  The cough comes and goes and is not persistent.  He denies any hemoptysis.  He denies any nausea or vomiting.  Denies any diarrhea or constipation.  The patient has a history of a pituitary tumor for which he previously saw Dr. Kathyanne Parkers.  They are worried about metastatic disease to the brain from his lung malignancy.   he patient recently had a restaging CT scan.  The patient saw his results on MyChart and asked to be seen sooner.  His scan mentioned enlarged prostate. He states he has had issues with enlarged prostate for years. His last PSA was performed by his PCP on 09/02/22 which  was 2.93.The patient denies urinary changes. He denies leg pain or swelling. He is here today for evaluation to review his scan results.  MEDICAL HISTORY: Past Medical History:  Diagnosis Date   Arthritis    CAD (coronary artery disease)    CABG with LIMA to LAD and RIMA to RCA August 2000 - Dr. Luna Salinas   Cancer [facility])    basal cell   CHF (congestive heart failure) (HCC)    COPD (chronic  obstructive pulmonary disease) (HCC)    Elevated glycated hemoglobin    Essential hypertension    GERD (gastroesophageal reflux disease)    Heart murmur    History of radiation therapy    Right lung(chest) 03/19/22-05/06/22- Dr. Retta Caster   Hyperlipidemia    Hypopituitarism [facility])    Lung cancer (HCC)    Osteopenia    Dr Celinda Collar   Pancreatitis    Peyronie disease    Testosterone  deficiency    Dr Celinda Collar    ALLERGIES:  is allergic to latex, bromocriptine, zestril  [lisinopril ], diovan  [valsartan ], hydrochlorothiazide , norvasc  [amlodipine ], and zocor [simvastatin].  MEDICATIONS:  Current Outpatient Medications  Medication Sig Dispense Refill   aspirin  EC 81 MG tablet Take 1 tablet (81 mg total) by mouth at bedtime. Okay to restart this medication on 07/03/2021 30 tablet 11   Cholecalciferol (VITAMIN D3) 2000 UNITS TABS Take 2,000 Units by mouth in the morning and at bedtime.     Coenzyme Q10 (COQ-10) 100 MG CAPS Take 100 mg by mouth in the morning.     finasteride (PROSCAR) 5 MG tablet Take 5 mg by mouth daily.     irbesartan  (AVAPRO ) 75 MG tablet TAKE 1 TABLET BY MOUTH EVERY DAY 90 tablet 1   nitroGLYCERIN  (NITROSTAT ) 0.4 MG SL tablet Place 1 tablet (0.4 mg total) under the tongue every 5 (five) minutes x 3 doses as needed for chest pain (if no relief after 2nd dose, proceed to ED or call 911). 25 tablet 3   Omega-3 Fatty Acids (OMEGA-3 FISH OIL PO) Take 1,000 mg by mouth in the morning.     omeprazole  (PRILOSEC ) 40 MG capsule Take 40 mg bid x 1 month then 40 mg once daily     rosuvastatin  (CRESTOR ) 20 MG tablet TAKE HALF A TABLET (10 MG) BY MOUTH ONCE DAILY. 45 tablet 3   sucralfate  (CARAFATE ) 1 g tablet Take 1 tablet (1 g total) by mouth 4 (four) times daily -  with meals and at bedtime. Crush and dissolve in 10 mL warm water prior to swallowing, take 20 min before meals 120 tablet 2   tamsulosin  (FLOMAX ) 0.4 MG CAPS capsule Take 0.4 mg by mouth in the morning.     Testosterone  20.25  MG/ACT (1.62%) GEL Apply 2 Pump topically in the morning.     vitamin C (ASCORBIC ACID) 500 MG tablet Take 500 mg by mouth in the morning.     No current facility-administered medications for this visit.    SURGICAL HISTORY:  Past Surgical History:  Procedure Laterality Date   Bone spur  2006   5th toe bilaterally   BRONCHIAL BIOPSY  07/02/2021   Procedure: BRONCHIAL BIOPSIES;  Surgeon: Denson Flake, MD;  Location: [facility] ENDOSCOPY;  Service: Pulmonary;;   BRONCHIAL BRUSHINGS  07/02/2021   Procedure: BRONCHIAL BRUSHINGS;  Surgeon: Denson Flake, MD;  Location: [facility] ENDOSCOPY;  Service: Pulmonary;;   BRONCHIAL NEEDLE ASPIRATION BIOPSY  07/02/2021   Procedure: BRONCHIAL NEEDLE ASPIRATION BIOPSIES;  Surgeon: Denson Flake, MD;  Location: [facility] ENDOSCOPY;  Service: Pulmonary;;   COLONOSCOPY  2007   negative; Dr Adan Holms   CORONARY ARTERY BYPASS GRAFT  2001   2 vessels   FIDUCIAL MARKER PLACEMENT  07/02/2021   Procedure: FIDUCIAL MARKER PLACEMENT;  Surgeon: Denson Flake, MD;  Location: [facility] ENDOSCOPY;  Service: Pulmonary;;   fracture heel  1967   Bil/ due to fall off ladder   Fractured calcaneus  1969   bilaterally w/ ankle fusion   HERNIA REPAIR  1984   right inguinal hernia   KNEE SURGERY Left    LOBECTOMY Right 11/13/2021   MENISECTOMY  2007   R medial, Dr. Argyle Kurk CATH INSERTION     SHOULDER SURGERY  1994   left   UPPER GASTROINTESTINAL ENDOSCOPY  2007   GERD, Dr Adan Holms   VASECTOMY     VIDEO BRONCHOSCOPY WITH RADIAL ENDOBRONCHIAL ULTRASOUND  07/02/2021   Procedure: VIDEO BRONCHOSCOPY WITH RADIAL ENDOBRONCHIAL ULTRASOUND;  Surgeon: Denson Flake, MD;  Location: [facility] ENDOSCOPY;  Service: Pulmonary;;    REVIEW OF SYSTEMS:   Review of Systems  Constitutional: Positive for fatigue and generalized weakness. Negative for appetite change, chills, fever and unexpected weight change.  HENT: Negative for mouth sores, nosebleeds, sore throat and trouble swallowing.   Eyes:  Negative for eye problems and icterus.  Respiratory: Positive for dyspnea on exertion. Positive for intermittent cough. Negative for hemoptysis and wheezing.   Cardiovascular: Negative for chest pain and leg swelling.  Gastrointestinal: Positive for intermittent abdominal pain. Negative for  constipation, diarrhea, nausea and vomiting.  Genitourinary: Negative for bladder incontinence, difficulty urinating, dysuria, frequency and hematuria.   Musculoskeletal: Negative for back pain, gait problem, neck pain and neck stiffness.  Skin: Negative for itching and rash.  Neurological: Positive for numbness on left temporal region due to prior skin cancer resection. Negative for dizziness, extremity weakness, gait problem, headaches, light-headedness and seizures.  Hematological: Negative for adenopathy. Does not bruise/bleed easily.  Psychiatric/Behavioral: Negative for confusion, depression and sleep disturbance. The patient is not nervous/anxious.     PHYSICAL EXAMINATION:  Blood pressure 132/70, pulse 81, temperature (!) 97.4 F (36.3 C), temperature source Temporal, resp. rate 15, weight 190 lb 9.6 oz (86.5 kg), SpO2 96%.  ECOG PERFORMANCE STATUS: 1  Physical Exam  Constitutional: Oriented to person, place, and time and well-developed, well-nourished, and in no distress.  HENT:  Head: Normocephalic and atraumatic.  Mouth/Throat: Oropharynx is clear and moist. No oropharyngeal exudate.  Eyes: Conjunctivae are normal. Right eye exhibits no discharge. Left eye exhibits no discharge. No scleral icterus.  Neck: Normal range of motion. Neck supple.  Cardiovascular: Normal rate, regular rhythm, normal heart sounds and intact distal pulses.   Pulmonary/Chest: Effort normal. Positive for decreased breath sounds in the right upper lung. No respiratory distress. No wheezes. No rales.  Abdominal: Soft. Bowel sounds are normal. Exhibits no distension and no mass. There is no tenderness.  Musculoskeletal:  Normal range of motion. Exhibits no edema.  Lymphadenopathy:    No cervical adenopathy.  Neurological: Alert and oriented to person, place, and time. Exhibits normal muscle tone. Gait normal. Coordination normal.  Skin: Skin is warm and dry. No rash noted. Not diaphoretic. No erythema. No pallor.  Psychiatric: Mood, memory and judgment normal.  Vitals reviewed.  LABORATORY DATA: Lab Results  Component Value Date   WBC 3.9 (L) 01/07/2023   HGB 13.2 01/07/2023   HCT 38.6 (L) 01/07/2023   MCV 93.2 01/07/2023   PLT 151  01/07/2023      Chemistry      Component Value Date/Time   NA 140 01/07/2023 0853   K 3.9 01/07/2023 0853   CL 107 01/07/2023 0853   CO2 29 01/07/2023 0853   BUN 18 01/07/2023 0853   CREATININE 1.00 01/07/2023 0853   CREATININE 1.07 08/25/2019 0847      Component Value Date/Time   CALCIUM  9.2 01/07/2023 0853   ALKPHOS 60 01/07/2023 0853   AST 17 01/07/2023 0853   ALT 16 01/07/2023 0853   BILITOT 0.6 01/07/2023 0853       RADIOGRAPHIC STUDIES:  CT Chest W Contrast Result Date: 01/17/2023 CLINICAL DATA:  Staging non-small-cell lung cancer. Chemotherapy and XRT. Immunotherapy. Cough. Mid chest pain. Low back pain. * Tracking Code: BO * EXAM: CT CHEST, ABDOMEN, AND PELVIS WITH CONTRAST TECHNIQUE: Multidetector CT imaging of the chest, abdomen and pelvis was performed following the standard protocol during bolus administration of intravenous contrast. RADIATION DOSE REDUCTION: This exam was performed according to the departmental dose-optimization program which includes automated exposure control, adjustment of the mA and/or kV according to patient size and/or use of iterative reconstruction technique. CONTRAST:  OMNIPAQUE  IOHEXOL  300 MG/ML  SOLN COMPARISON:  10/10/2022 and older. FINDINGS: CT CHEST FINDINGS Cardiovascular: Right upper chest port is accessed. Tip seen as far as the right atrium. Heart is nonenlarged. No significant pericardial effusion. Coronary  artery calcifications are seen. The thoracic aorta has a normal course and caliber with scattered calcified plaque. Bovine type aortic arch, normal variant. Status post median sternotomy. Mediastinum/Nodes: Thyroid  gland is unremarkable. The thoracic esophagus has a normal course and caliber. No specific abnormal lymph node enlargement identified in the axillary region or hilum. Small upper mediastinal nodes identified but less than a cm short axis and unchanged. However there is a abnormal lymph node seen in the thoracic inlet on the right side immediately anterior to the posterior aspect of the right second rib on series 2, image 9 measuring 2.5 x 1.9 cm. Previously this same node would have added maximum diameter of 8 mm. Lungs/Pleura: Status post right upper lobectomy. There is associated right apical pleural thickening and scarring. Distortion suprahilar on the right with bronchiectasis stable. No new dominant mass lesion identified in the lungs. Once again there is a extensive centrilobular and paraseptal emphysematous changes. No pleural effusion, pneumothorax or consolidation. Musculoskeletal: Mild degenerative changes along the spine. CT ABDOMEN PELVIS FINDINGS Hepatobiliary: No focal liver abnormality is seen. No gallstones, gallbladder wall thickening, or biliary dilatation. Patent portal vein. Pancreas: Moderate atrophy of the pancreas.  No obvious mass Spleen: Normal in size without focal abnormality. Adrenals/Urinary Tract: Adrenal glands are preserved. Mild areas of renal atrophy with some tiny subcentimeter cystic foci, Bosniak 2 lesions. No enhancing mass or collecting system dilatation. Preserved contours of the urinary bladder Stomach/Bowel: On this non oral contrast exam large bowel has a normal course and caliber with diffuse moderate colonic stool. Few scattered colonic diverticula. Normal appendix. Stomach and small bowel are nondilated. Vascular/Lymphatic: Diffuse vascular calcifications  identified along the aorta and branch vessels. There is what appears to be a high-grade stenosis of the origin of the right renal artery. Please correlate with symptoms of hypertension. There is also areas of stenosis seen along the common iliac arteries, right worse than left. Please see coronal series 4, image 75. Preserved IVC. No specific abnormal lymph node enlargement identified in the abdomen and pelvis. Reproductive: Enlarged prostate with mass effect along the base of the  bladder with enlarged median lobe of the prostate. Please correlate with BPH symptoms. Please correlate with the patient's PSA Other: Small fat containing inguinal hernias. Musculoskeletal: Mild curvature of the lumbar spine. Scattered degenerative changes of the spine and pelvis. IMPRESSION: Developing abnormal lymph node along the thoracic inlet on the right side worrisome for malignant node. Recommend further evaluation. PET-CT scan may be useful as the next step in the workup. Otherwise stable posttreatment changes along the right upper thorax with scarring, pleural thickening. Diffuse atherosclerotic disease of the aorta and branch vessels with the areas of significant stenosis seen particularly along the origin of the right renal artery and common iliac vessels. Please correlate with any particular symptoms. Additional workup as clinically appropriate. Enlarged prostate with mass effect along the bladder. Please correlate with patient's PSA. Few scattered colonic diverticula. Findings will be called to the ordering service by the Radiology physician assistant team. Electronically Signed   By: Adrianna Horde M.D.   On: 01/17/2023 10:19   CT ABDOMEN PELVIS W CONTRAST Result Date: 01/17/2023 CLINICAL DATA:  Staging non-small-cell lung cancer. Chemotherapy and XRT. Immunotherapy. Cough. Mid chest pain. Low back pain. * Tracking Code: BO * EXAM: CT CHEST, ABDOMEN, AND PELVIS WITH CONTRAST TECHNIQUE: Multidetector CT imaging of the chest,  abdomen and pelvis was performed following the standard protocol during bolus administration of intravenous contrast. RADIATION DOSE REDUCTION: This exam was performed according to the departmental dose-optimization program which includes automated exposure control, adjustment of the mA and/or kV according to patient size and/or use of iterative reconstruction technique. CONTRAST:  OMNIPAQUE  IOHEXOL  300 MG/ML  SOLN COMPARISON:  10/10/2022 and older. FINDINGS: CT CHEST FINDINGS Cardiovascular: Right upper chest port is accessed. Tip seen as far as the right atrium. Heart is nonenlarged. No significant pericardial effusion. Coronary artery calcifications are seen. The thoracic aorta has a normal course and caliber with scattered calcified plaque. Bovine type aortic arch, normal variant. Status post median sternotomy. Mediastinum/Nodes: Thyroid  gland is unremarkable. The thoracic esophagus has a normal course and caliber. No specific abnormal lymph node enlargement identified in the axillary region or hilum. Small upper mediastinal nodes identified but less than a cm short axis and unchanged. However there is a abnormal lymph node seen in the thoracic inlet on the right side immediately anterior to the posterior aspect of the right second rib on series 2, image 9 measuring 2.5 x 1.9 cm. Previously this same node would have added maximum diameter of 8 mm. Lungs/Pleura: Status post right upper lobectomy. There is associated right apical pleural thickening and scarring. Distortion suprahilar on the right with bronchiectasis stable. No new dominant mass lesion identified in the lungs. Once again there is a extensive centrilobular and paraseptal emphysematous changes. No pleural effusion, pneumothorax or consolidation. Musculoskeletal: Mild degenerative changes along the spine. CT ABDOMEN PELVIS FINDINGS Hepatobiliary: No focal liver abnormality is seen. No gallstones, gallbladder wall thickening, or biliary  dilatation. Patent portal vein. Pancreas: Moderate atrophy of the pancreas.  No obvious mass Spleen: Normal in size without focal abnormality. Adrenals/Urinary Tract: Adrenal glands are preserved. Mild areas of renal atrophy with some tiny subcentimeter cystic foci, Bosniak 2 lesions. No enhancing mass or collecting system dilatation. Preserved contours of the urinary bladder Stomach/Bowel: On this non oral contrast exam large bowel has a normal course and caliber with diffuse moderate colonic stool. Few scattered colonic diverticula. Normal appendix. Stomach and small bowel are nondilated. Vascular/Lymphatic: Diffuse vascular calcifications identified along the aorta and branch vessels. There is  what appears to be a high-grade stenosis of the origin of the right renal artery. Please correlate with symptoms of hypertension. There is also areas of stenosis seen along the common iliac arteries, right worse than left. Please see coronal series 4, image 75. Preserved IVC. No specific abnormal lymph node enlargement identified in the abdomen and pelvis. Reproductive: Enlarged prostate with mass effect along the base of the bladder with enlarged median lobe of the prostate. Please correlate with BPH symptoms. Please correlate with the patient's PSA Other: Small fat containing inguinal hernias. Musculoskeletal: Mild curvature of the lumbar spine. Scattered degenerative changes of the spine and pelvis. IMPRESSION: Developing abnormal lymph node along the thoracic inlet on the right side worrisome for malignant node. Recommend further evaluation. PET-CT scan may be useful as the next step in the workup. Otherwise stable posttreatment changes along the right upper thorax with scarring, pleural thickening. Diffuse atherosclerotic disease of the aorta and branch vessels with the areas of significant stenosis seen particularly along the origin of the right renal artery and common iliac vessels. Please correlate with any  particular symptoms. Additional workup as clinically appropriate. Enlarged prostate with mass effect along the bladder. Please correlate with patient's PSA. Few scattered colonic diverticula. Findings will be called to the ordering service by the Radiology physician assistant team. Electronically Signed   By: Adrianna Horde M.D.   On: 01/17/2023 10:19     ASSESSMENT/PLAN:  This is a very pleasant 78 year old Caucasian male with recurrent non-small cell lung cancer, adenocarcinoma that was initially diagnosed as multifocal synchronous disease including stage IIIA (T1c, M2, M0) adenocarcinoma in the right upper lobe as well as stage IA (T1c, N0, M0) squamous cell carcinoma involving the right upper lobe and stage IA (T1b, N0, M0) adenocarcinoma involving the left upper lobe diagnosed in June 2023.   The patient is status post the following treatment: 1) status post neoadjuvant systemic chemotherapy with carboplatin , paclitaxel  and nivolumab  for 3 cycles in [place]  followed by right upper lobectomy with lymph node dissection at [name] under the care of Dr. Vickey Grammes with the final pathology showing residual disease with synchronous adenocarcinoma as well as squamous cell carcinoma in the right upper lobe and multiple mediastinal lymph node involvement performed on November 13, 2021. 2)  evidence for disease recurrence with involvement of 4R lymphadenopathy consistent with metastatic adenocarcinoma on February 26, 2022 biopsy. Molecular studies showed no actionable mutations and PD-L1 expression was 15% in the adenocarcinoma in 2% in the squamous cell carcinoma. 3) Concurrent chemotherapy with carboplatin  for AUC of 5 and Alimta 500 Mg/M2 every 3 weeks for 2 cycles during the course of radiotherapy.  Last dose was giving 04/08/2022. He tolerated his treatment well except for the chemotherapy-induced odynophagia in addition to fatigue and sore throat. He started treatment with consolidation  immunotherapy with Imfinzi  1500 Mg IV every 4 weeks status post 1 cycle.  This was discontinued secondary to intolerance and severe diarrhea managed by his gastroenterologist Dr. Sandrea Cruel.  The patient is currently on observation.  He recently had a restaging CT scan.  He is here today for evaluation to review his scan results.  The patient was seen with Dr. Marguerita Shih.  Dr. Marguerita Shih personally and independently reviewed the scan and discussed the results with the patient.  The scan showed developing abnormal lymph node along the thoracic inlet on the right side worrisome for malignant node.  Otherwise it showed stable posttreatment changes along the right upper thorax with  scarring and pleural thickening.  I also mention enlarged prostate with mass effect along the bladder and correlate with PSA.  And also showed diffuse atherosclerotic disease of the aorta and branch vessels with significant stenosis along the origin of the right renal artery and common iliac.  Dr. Marguerita Shih recommends ordering PET scan. If the thoracic inlet lesion is hypermetabolic, Dr. Marguerita Shih may consider referring the patient to radiation.   I will also add on PSA to his next lab visit.   They are very concerned about metastatic disease to the brain. We will order brain MRI to complete the re-staging workup.   The patient was advised to call immediately if she has any concerning symptoms in the interval. The patient voices understanding of current disease status and treatment options and is in agreement with the current care plan. All questions were answered. The patient knows to call the clinic with any problems, questions or concerns. We can certainly see the patient much sooner if necessary     Orders Placed This Encounter  Procedures   NM PET Image Restag (PS) Skull Base To Thigh    Standing Status:   Future    Expected Date:   01/29/2023    Expiration Date:   01/22/2024    If indicated for the ordered procedure, I authorize  the administration of a radiopharmaceutical per Radiology protocol:   Yes    Preferred imaging location?:   [facility]   MR BRAIN W WO CONTRAST    Standing Status:   Future    Expected Date:   01/29/2023    Expiration Date:   01/22/2024    If indicated for the ordered procedure, I authorize the administration of contrast media per Radiology protocol:   Yes    What is the patient's sedation requirement?:   No Sedation    Does the patient have a pacemaker or implanted devices?:   No    Use SRS Protocol?:   No    Preferred imaging location?:   [facility] (table limit - 550 lbs)   CBC with Differential (Cancer Center Only)    Standing Status:   Future    Expected Date:   02/12/2023    Expiration Date:   01/22/2024   CMP (Cancer Center only)    Standing Status:   Future    Expected Date:   02/12/2023    Expiration Date:   01/22/2024   Prostate-Specific AG, Serum    Standing Status:   Future    Expected Date:   02/12/2023    Expiration Date:   01/22/2024     Cherly Erno L Chinmayi Rumer, PA-C 01/22/23  ADDENDUM: Hematology/Oncology Attending: I had a face-to-face encounter with the patient today.  I reviewed his record, lab, scan and recommended his care plan.  This is a very pleasant 78 years old white male with history of stage IIIa non-small cell lung cancer, adenocarcinoma that was initially diagnosed as stage Ia in June 2023 and he had recurrence in February 2024.  The patient had molecular studies showed no actionable mutations and PD-L1 expression 15%.  He was treated with a course of concurrent chemoradiation with 2 cycles of carboplatin  for AUC of 5 and Alimta 500 Mg/M2 followed by consolidation treatment with immunotherapy that was given only for 1 cycle before it was discontinued based on the patient's request and intolerance secondary to diarrhea. He has been on observation since that time and the patient is feeling fine with no significant diarrhea at  this point but he continues to  have intermittent chest pain and shortness of breath.  He had repeat CT scan of the chest, abdomen and pelvis performed recently.  I personally and independently reviewed the scan images and discussed the result and showed the images to the patient and his wife.  His scan showed no concerning finding for disease progression except for developing abnormal lymph node along the thoracic inlet on the right side worrisome for malignant node and PET scan was recommended for further evaluation.  He has no other evidence of metastatic disease but has enlarged prostate with mass effect along the bladder. I recommended for the patient to have a PET scan for further evaluation of his condition.  Will also order MRI of the brain to rule out any brain metastasis. Regarding the prostate mass, will check PSA. The patient will come back for follow-up visit in around 3 weeks for evaluation and discussion of his imaging studies and further recommendation regarding his condition. The patient was advised to call immediately if he has any other concerning symptoms in the interval. The total time spent in the appointment was 30 minutes. Disclaimer: This Harris was dictated with voice recognition software. Similar sounding words can inadvertently be transcribed and may be missed upon review. Aurelio Blower, MD

## 2023-01-22 ENCOUNTER — Inpatient Hospital Stay: Payer: Medicare Other | Attending: Internal Medicine | Admitting: Physician Assistant

## 2023-01-22 VITALS — BP 132/70 | HR 81 | Temp 97.4°F | Resp 15 | Wt 190.6 lb

## 2023-01-22 DIAGNOSIS — C3411 Malignant neoplasm of upper lobe, right bronchus or lung: Secondary | ICD-10-CM | POA: Insufficient documentation

## 2023-01-22 DIAGNOSIS — Z9221 Personal history of antineoplastic chemotherapy: Secondary | ICD-10-CM | POA: Diagnosis not present

## 2023-01-22 DIAGNOSIS — Z79899 Other long term (current) drug therapy: Secondary | ICD-10-CM | POA: Insufficient documentation

## 2023-01-22 DIAGNOSIS — Z902 Acquired absence of lung [part of]: Secondary | ICD-10-CM | POA: Diagnosis not present

## 2023-01-22 DIAGNOSIS — I700 Atherosclerosis of aorta: Secondary | ICD-10-CM | POA: Diagnosis not present

## 2023-01-22 DIAGNOSIS — R109 Unspecified abdominal pain: Secondary | ICD-10-CM | POA: Diagnosis not present

## 2023-01-22 DIAGNOSIS — C3490 Malignant neoplasm of unspecified part of unspecified bronchus or lung: Secondary | ICD-10-CM

## 2023-01-22 DIAGNOSIS — Z923 Personal history of irradiation: Secondary | ICD-10-CM | POA: Diagnosis not present

## 2023-01-22 DIAGNOSIS — C3412 Malignant neoplasm of upper lobe, left bronchus or lung: Secondary | ICD-10-CM | POA: Diagnosis not present

## 2023-01-22 DIAGNOSIS — C3491 Malignant neoplasm of unspecified part of right bronchus or lung: Secondary | ICD-10-CM

## 2023-01-22 DIAGNOSIS — R1310 Dysphagia, unspecified: Secondary | ICD-10-CM | POA: Insufficient documentation

## 2023-01-22 DIAGNOSIS — N400 Enlarged prostate without lower urinary tract symptoms: Secondary | ICD-10-CM | POA: Insufficient documentation

## 2023-01-26 ENCOUNTER — Ambulatory Visit (HOSPITAL_COMMUNITY)
Admission: RE | Admit: 2023-01-26 | Discharge: 2023-01-26 | Disposition: A | Discharge status: Home / Self Care | Payer: Medicare Other | Source: Ambulatory Visit | Attending: Physician Assistant | Admitting: Physician Assistant

## 2023-01-26 DIAGNOSIS — G319 Degenerative disease of nervous system, unspecified: Secondary | ICD-10-CM | POA: Diagnosis not present

## 2023-01-26 DIAGNOSIS — C3490 Malignant neoplasm of unspecified part of unspecified bronchus or lung: Secondary | ICD-10-CM | POA: Insufficient documentation

## 2023-01-26 DIAGNOSIS — J341 Cyst and mucocele of nose and nasal sinus: Secondary | ICD-10-CM | POA: Diagnosis not present

## 2023-01-26 MED ORDER — GADOBUTROL 1 MMOL/ML IV SOLN
8.6000 mL | Freq: Once | INTRAVENOUS | Status: AC | PRN
  Administered 2023-01-26: 8.6 mL via INTRAVENOUS

## 2023-01-28 ENCOUNTER — Ambulatory Visit: Payer: Medicare Other | Admitting: Physician Assistant

## 2023-01-30 ENCOUNTER — Encounter (HOSPITAL_COMMUNITY)
Admission: RE | Admit: 2023-01-30 | Discharge: 2023-01-30 | Disposition: A | Discharge status: Home / Self Care | Payer: Medicare Other | Source: Ambulatory Visit | Attending: Physician Assistant | Admitting: Physician Assistant

## 2023-01-30 ENCOUNTER — Encounter: Payer: Self-pay | Admitting: Physician Assistant

## 2023-01-30 DIAGNOSIS — C3490 Malignant neoplasm of unspecified part of unspecified bronchus or lung: Secondary | ICD-10-CM | POA: Insufficient documentation

## 2023-01-30 DIAGNOSIS — C771 Secondary and unspecified malignant neoplasm of intrathoracic lymph nodes: Secondary | ICD-10-CM | POA: Diagnosis not present

## 2023-01-30 LAB — GLUCOSE, CAPILLARY: Glucose-Capillary: 99 mg/dL (ref 70–99)

## 2023-01-30 MED ORDER — FLUDEOXYGLUCOSE F - 18 (FDG) INJECTION
9.4600 | Freq: Once | INTRAVENOUS | Status: AC
  Administered 2023-01-30: 9.46 via INTRAVENOUS

## 2023-01-31 ENCOUNTER — Telehealth: Payer: Self-pay | Admitting: Medical Oncology

## 2023-01-31 NOTE — Telephone Encounter (Signed)
I told pt to keep appt next week to review PET scan.

## 2023-02-03 ENCOUNTER — Telehealth: Payer: Self-pay

## 2023-02-03 NOTE — Telephone Encounter (Signed)
Copied from CRM [id number]. Topic: Clinical - Request for Lab/Test Order >> Feb 03, 2023  9:59 AM Patrick Harris wrote: Reason for CRM: Patient called in to have a thyroid test completed

## 2023-02-03 NOTE — Telephone Encounter (Unsigned)
Copied from CRM [id number]. Topic: Clinical - Request for Lab/Test Order >> Feb 03, 2023  9:59 AM Patrick Harris wrote: Reason for CRM: Patient called in to have a thyroid test completed

## 2023-02-05 NOTE — Telephone Encounter (Signed)
Spoke with patient today and said he might wait until he comes in next month for his follow up appointment to discuss his thyroid.

## 2023-02-06 ENCOUNTER — Other Ambulatory Visit: Payer: Self-pay | Admitting: Internal Medicine

## 2023-02-06 ENCOUNTER — Other Ambulatory Visit: Payer: Medicare Other

## 2023-02-06 ENCOUNTER — Inpatient Hospital Stay: Payer: Medicare Other | Admitting: Internal Medicine

## 2023-02-06 ENCOUNTER — Inpatient Hospital Stay: Payer: Medicare Other

## 2023-02-06 VITALS — BP 137/62 | HR 80 | Temp 97.3°F | Resp 16 | Ht 72.0 in | Wt 191.2 lb

## 2023-02-06 DIAGNOSIS — Z923 Personal history of irradiation: Secondary | ICD-10-CM | POA: Diagnosis not present

## 2023-02-06 DIAGNOSIS — Z902 Acquired absence of lung [part of]: Secondary | ICD-10-CM | POA: Diagnosis not present

## 2023-02-06 DIAGNOSIS — C3411 Malignant neoplasm of upper lobe, right bronchus or lung: Secondary | ICD-10-CM | POA: Diagnosis not present

## 2023-02-06 DIAGNOSIS — Z9221 Personal history of antineoplastic chemotherapy: Secondary | ICD-10-CM | POA: Diagnosis not present

## 2023-02-06 DIAGNOSIS — C3490 Malignant neoplasm of unspecified part of unspecified bronchus or lung: Secondary | ICD-10-CM

## 2023-02-06 DIAGNOSIS — Z95828 Presence of other vascular implants and grafts: Secondary | ICD-10-CM

## 2023-02-06 DIAGNOSIS — I700 Atherosclerosis of aorta: Secondary | ICD-10-CM | POA: Diagnosis not present

## 2023-02-06 DIAGNOSIS — E349 Endocrine disorder, unspecified: Secondary | ICD-10-CM

## 2023-02-06 DIAGNOSIS — C3412 Malignant neoplasm of upper lobe, left bronchus or lung: Secondary | ICD-10-CM | POA: Diagnosis not present

## 2023-02-06 DIAGNOSIS — R109 Unspecified abdominal pain: Secondary | ICD-10-CM | POA: Diagnosis not present

## 2023-02-06 DIAGNOSIS — R1310 Dysphagia, unspecified: Secondary | ICD-10-CM | POA: Diagnosis not present

## 2023-02-06 DIAGNOSIS — Z79899 Other long term (current) drug therapy: Secondary | ICD-10-CM | POA: Diagnosis not present

## 2023-02-06 LAB — CBC WITH DIFFERENTIAL (CANCER CENTER ONLY)
Abs Immature Granulocytes: 0 10*3/uL (ref 0.00–0.07)
Basophils Absolute: 0 10*3/uL (ref 0.0–0.1)
Basophils Relative: 0 %
Eosinophils Absolute: 0.1 10*3/uL (ref 0.0–0.5)
Eosinophils Relative: 3 %
HCT: 41.7 % (ref 39.0–52.0)
Hemoglobin: 14.2 g/dL (ref 13.0–17.0)
Immature Granulocytes: 0 %
Lymphocytes Relative: 22 %
Lymphs Abs: 0.8 10*3/uL (ref 0.7–4.0)
MCH: 32.3 pg (ref 26.0–34.0)
MCHC: 34.1 g/dL (ref 30.0–36.0)
MCV: 95 fL (ref 80.0–100.0)
Monocytes Absolute: 0.5 10*3/uL (ref 0.1–1.0)
Monocytes Relative: 14 %
Neutro Abs: 2.3 10*3/uL (ref 1.7–7.7)
Neutrophils Relative %: 61 %
Platelet Count: 150 10*3/uL (ref 150–400)
RBC: 4.39 MIL/uL (ref 4.22–5.81)
RDW: 13.3 % (ref 11.5–15.5)
WBC Count: 3.7 10*3/uL — ABNORMAL LOW (ref 4.0–10.5)
nRBC: 0 % (ref 0.0–0.2)

## 2023-02-06 LAB — CMP (CANCER CENTER ONLY)
ALT: 14 U/L (ref 0–44)
AST: 18 U/L (ref 15–41)
Albumin: 4.1 g/dL (ref 3.5–5.0)
Alkaline Phosphatase: 65 U/L (ref 38–126)
Anion gap: 4 — ABNORMAL LOW (ref 5–15)
BUN: 17 mg/dL (ref 8–23)
CO2: 28 mmol/L (ref 22–32)
Calcium: 9.6 mg/dL (ref 8.9–10.3)
Chloride: 107 mmol/L (ref 98–111)
Creatinine: 0.97 mg/dL (ref 0.61–1.24)
GFR, Estimated: >60 mL/min (ref >60)
Glucose, Bld: 127 mg/dL — ABNORMAL HIGH (ref 70–99)
Potassium: 4.4 mmol/L (ref 3.5–5.1)
Sodium: 139 mmol/L (ref 135–145)
Total Bilirubin: 0.5 mg/dL (ref 0.0–1.2)
Total Protein: 6.9 g/dL (ref 6.5–8.1)

## 2023-02-06 LAB — TSH: TSH: 1.849 u[IU]/mL (ref 0.350–4.500)

## 2023-02-06 MED ORDER — SODIUM CHLORIDE 0.9% FLUSH
10.0000 mL | Freq: Once | INTRAVENOUS | Status: AC
  Administered 2023-02-06: 10 mL

## 2023-02-06 MED ORDER — HEPARIN SOD (PORK) LOCK FLUSH 100 UNIT/ML IV SOLN
500.0000 [IU] | Freq: Once | INTRAVENOUS | Status: AC
  Administered 2023-02-06: 500 [IU]

## 2023-02-06 NOTE — Progress Notes (Signed)
[facility] [phone]   [phone]  OFFICE PROGRESS NOTE  [name], MD [address] [place] [address]  DIAGNOSIS:  Stage IIIA (T1c, N 2, M0) adenocarcinoma in the right upper lobe as well as stage IA (T1c, N0, M0) squamous cell carcinoma involving the right upper lobe and stage IA (T1b, N0, M0) adenocarcinoma involving the left upper lobe diagnosed in June 2023.  PRIOR THERAPY:  1) status post neoadjuvant systemic chemotherapy with carboplatin, paclitaxel and nivolumab for 3 cycles in [facility] followed by right upper lobectomy with lymph node dissection at [facility] under the care of Dr. Glenna Durand with the final pathology showing residual disease with synchronous adenocarcinoma as well as squamous cell carcinoma in the right upper lobe and multiple mediastinal lymph node involvement performed on November 13, 2021. 2)  evidence for disease recurrence with involvement of 4R lymphadenopathy consistent with metastatic adenocarcinoma on February 26, 2022 biopsy. Molecular studies showed no actionable mutations and PD-L1 expression was 15% in the adenocarcinoma in 2% in the squamous cell carcinoma. 3) Concurrent chemotherapy with carboplatin for AUC of 5 and Alimta 500 Mg/M2 every 3 weeks for 2 cycles during the course of radiotherapy.  Last dose was giving 04/08/2022. 4) Consolidation treatment with immunotherapy with Imfinzi 1500 Mg IV every 4 weeks.  First dose May 29, 2022 status post 1 cycle.  This treatment was discontinued secondary to intolerance  CURRENT THERAPY: Observation  INTERVAL HISTORY: Patrick Harris 78 y.o. male returns to the clinic today for follow-up visit accompanied by his wife.  Discussed the use of AI scribe software for clinical note transcription with the patient, who gave verbal consent to proceed.  History of Present Illness   The patient is a 78 year old male with adenocarcinoma who presents with  concerns about a potential recurrence of cancer.  He has a history of adenocarcinoma diagnosed in June 2024, treated with approximately six and a half months of chemotherapy with carboplatin and Paclitaxel concurrent with radiation therapy. Recent imaging revealed a suspicious right paratracheal lymphadenopathy, raising concerns about potential cancer recurrence. He is apprehensive about the possibility of further systemic treatment for this localized spot.  He experiences a burning sensation when swallowing, which he attributes to previous radiation treatment. He also reports congestion and a sensation of something sticking in his throat, leading to coughing and difficulty swallowing, which he associates with previous radiation and scarring.  He has stomach pain and weakness, initially attributed to immunotherapy, and continues to feel unwell despite eating. A gastroenterologist found no abnormalities. He experiences bowel irregularities, with infrequent bowel movements and occasional good bowel movements, but no diarrhea or constipation.  He has a history of neuropathy, described as pain in his hand, which he feels but is not visible. He is concerned about the possibility of stomach cancer, although recent PET scans and endoscopic evaluations have not indicated any such findings.       MEDICAL HISTORY: Past Medical History:  Diagnosis Date   Arthritis    CAD (coronary artery disease)    CABG with LIMA to LAD and RIMA to RCA August 2000 - Dr. Dorris Fetch   Cancer [facility])    basal cell   CHF (congestive heart failure) (HCC)    COPD (chronic obstructive pulmonary disease) (HCC)    Elevated glycated hemoglobin    Essential hypertension    GERD (gastroesophageal reflux disease)    Heart murmur    History  of radiation therapy    Right lung(chest) 03/19/22-05/06/22- Dr. Antony Blackbird   Hyperlipidemia    Hypopituitarism [facility])    Lung cancer (HCC)    Osteopenia    Dr Juleen China   Pancreatitis     Peyronie disease    Testosterone deficiency    Dr Juleen China    ALLERGIES:  is allergic to latex, bromocriptine, zestril [lisinopril], diovan [valsartan], hydrochlorothiazide, norvasc [amlodipine], and zocor [simvastatin].  MEDICATIONS:  Current Outpatient Medications  Medication Sig Dispense Refill   aspirin EC 81 MG tablet Take 1 tablet (81 mg total) by mouth at bedtime. Okay to restart this medication on 07/03/2021 30 tablet 11   Cholecalciferol (VITAMIN D3) 2000 UNITS TABS Take 2,000 Units by mouth in the morning and at bedtime.     Coenzyme Q10 (COQ-10) 100 MG CAPS Take 100 mg by mouth in the morning.     finasteride (PROSCAR) 5 MG tablet Take 5 mg by mouth daily.     irbesartan (AVAPRO) 75 MG tablet TAKE 1 TABLET BY MOUTH EVERY DAY 90 tablet 1   nitroGLYCERIN (NITROSTAT) 0.4 MG SL tablet Place 1 tablet (0.4 mg total) under the tongue every 5 (five) minutes x 3 doses as needed for chest pain (if no relief after 2nd dose, proceed to ED or call 911). 25 tablet 3   Omega-3 Fatty Acids (OMEGA-3 FISH OIL PO) Take 1,000 mg by mouth in the morning.     omeprazole (PRILOSEC) 40 MG capsule Take 40 mg bid x 1 month then 40 mg once daily     rosuvastatin (CRESTOR) 20 MG tablet TAKE HALF A TABLET (10 MG) BY MOUTH ONCE DAILY. 45 tablet 3   sucralfate (CARAFATE) 1 g tablet Take 1 tablet (1 g total) by mouth 4 (four) times daily -  with meals and at bedtime. Crush and dissolve in 10 mL warm water prior to swallowing, take 20 min before meals 120 tablet 2   tamsulosin (FLOMAX) 0.4 MG CAPS capsule Take 0.4 mg by mouth in the morning.     Testosterone 20.25 MG/ACT (1.62%) GEL Apply 2 Pump topically in the morning.     vitamin C (ASCORBIC ACID) 500 MG tablet Take 500 mg by mouth in the morning.     No current facility-administered medications for this visit.    SURGICAL HISTORY:  Past Surgical History:  Procedure Laterality Date   Bone spur  2006   5th toe bilaterally   BRONCHIAL BIOPSY  07/02/2021    Procedure: BRONCHIAL BIOPSIES;  Surgeon: Leslye Peer, MD;  Location: [facility] ENDOSCOPY;  Service: Pulmonary;;   BRONCHIAL BRUSHINGS  07/02/2021   Procedure: BRONCHIAL BRUSHINGS;  Surgeon: Leslye Peer, MD;  Location: [facility] ENDOSCOPY;  Service: Pulmonary;;   BRONCHIAL NEEDLE ASPIRATION BIOPSY  07/02/2021   Procedure: BRONCHIAL NEEDLE ASPIRATION BIOPSIES;  Surgeon: Leslye Peer, MD;  Location: [facility] ENDOSCOPY;  Service: Pulmonary;;   COLONOSCOPY  2007   negative; Dr Jarold Motto   CORONARY ARTERY BYPASS GRAFT  2001   2 vessels   FIDUCIAL MARKER PLACEMENT  07/02/2021   Procedure: FIDUCIAL MARKER PLACEMENT;  Surgeon: Leslye Peer, MD;  Location: [facility] ENDOSCOPY;  Service: Pulmonary;;   fracture heel  1967   Bil/ due to fall off ladder   Fractured calcaneus  1969   bilaterally w/ ankle fusion   HERNIA REPAIR  1984   right inguinal hernia   KNEE SURGERY Left    LOBECTOMY Right 11/13/2021   MENISECTOMY  2007   R medial,  Dr. Charmayne Sheer CATH INSERTION     SHOULDER SURGERY  1994   left   UPPER GASTROINTESTINAL ENDOSCOPY  2007   GERD, Dr Jarold Motto   VASECTOMY     VIDEO BRONCHOSCOPY WITH RADIAL ENDOBRONCHIAL ULTRASOUND  07/02/2021   Procedure: VIDEO BRONCHOSCOPY WITH RADIAL ENDOBRONCHIAL ULTRASOUND;  Surgeon: Leslye Peer, MD;  Location: [facility] ENDOSCOPY;  Service: Pulmonary;;    REVIEW OF SYSTEMS:  Constitutional: positive for fatigue Eyes: negative Ears, nose, mouth, throat, and face: negative Respiratory: negative Cardiovascular: negative Gastrointestinal: positive for abdominal pain Genitourinary:negative Integument/breast: negative Hematologic/lymphatic: negative Musculoskeletal:negative Neurological: negative Behavioral/Psych: negative Endocrine: negative Allergic/Immunologic: negative   PHYSICAL EXAMINATION: General appearance: alert, cooperative, fatigued, and no distress Head: Normocephalic, without obvious abnormality, atraumatic Neck: no adenopathy, no JVD, supple,  symmetrical, trachea midline, and thyroid not enlarged, symmetric, no tenderness/mass/nodules Lymph nodes: Cervical, supraclavicular, and axillary nodes normal. Resp: clear to auscultation bilaterally Back: symmetric, no curvature. ROM normal. No CVA tenderness. Cardio: regular rate and rhythm, S1, S2 normal, no murmur, click, rub or gallop GI: soft, non-tender; bowel sounds normal; no masses,  no organomegaly Extremities: extremities normal, atraumatic, no cyanosis or edema Neurologic: Alert and oriented X 3, normal strength and tone. Normal symmetric reflexes. Normal coordination and gait  ECOG PERFORMANCE STATUS: 1 - Symptomatic but completely ambulatory  Blood pressure 137/62, pulse 80, temperature (!) 97.3 F (36.3 C), temperature source Temporal, resp. rate 16, height 6' (1.829 m), weight 191 lb 3.2 oz (86.7 kg), SpO2 97%.  LABORATORY DATA: Lab Results  Component Value Date   WBC 3.9 (L) 01/07/2023   HGB 13.2 01/07/2023   HCT 38.6 (L) 01/07/2023   MCV 93.2 01/07/2023   PLT 151 01/07/2023      Chemistry      Component Value Date/Time   NA 140 01/07/2023 0853   K 3.9 01/07/2023 0853   CL 107 01/07/2023 0853   CO2 29 01/07/2023 0853   BUN 18 01/07/2023 0853   CREATININE 1.00 01/07/2023 0853   CREATININE 1.07 08/25/2019 0847      Component Value Date/Time   CALCIUM 9.2 01/07/2023 0853   ALKPHOS 60 01/07/2023 0853   AST 17 01/07/2023 0853   ALT 16 01/07/2023 0853   BILITOT 0.6 01/07/2023 0853       RADIOGRAPHIC STUDIES: NM PET Image Restag (PS) Skull Base To Thigh Result Date: 01/30/2023 CLINICAL DATA:  Subsequent treatment strategy for non-small cell lung cancer enlarging lymph node on CT exam. EXAM: NUCLEAR MEDICINE PET SKULL BASE TO THIGH TECHNIQUE: 9.45 mCi F-18 FDG was injected intravenously. Full-ring PET imaging was performed from the skull base to thigh after the radiotracer. CT data was obtained and used for attenuation correction and anatomic localization.  Fasting blood glucose: 99 mg/dl COMPARISON:  CT scan 16/10/9602 FINDINGS: NECK: No hypermetabolic lymph nodes in the neck. Incidental CT findings: None. CHEST: High RIGHT paratracheal node is again demonstrated measuring 17 mm short axis compared to 19 mm on recent CT scan. The node is intensely hypermetabolic with SUV max equal 13.8 (image 53). No additional hypermetabolic mediastinal lymph nodes. Post surgical radiation change in the RIGHT upper lobe with out evidence of recurrence within the pulmonary parenchyma. Calcified pleural plaque in the posterior RIGHT lower lobe. Focus of metabolic activity in the LEFT lung with SUV max equal 3.0 on image 71. There is ill-defined thickening along the pleural edge/subpleural cystic change. Incidental CT findings: None. ABDOMEN/PELVIS: No abnormal hypermetabolic activity within the liver, pancreas, adrenal glands, or spleen.  No hypermetabolic lymph nodes in the abdomen or pelvis. Incidental CT findings: None. SKELETON: Subtle hypermetabolic activity in the posterior LEFT iliac bone with SUV max 3.5. No CT correlation. Photopenia through the thoracic spine related to radiation therapy. Incidental CT findings: None. IMPRESSION: 1. Intensely hypermetabolic high RIGHT paratracheal lymph node consistent with recurrent nodal metastasis. 2. No evidence of local recurrence in the RIGHT upper lobe. 3. Focus of metabolic activity in the periphery of the LEFT upper is indeterminate. Recommend attention on follow-up. Electronically Signed   By: Genevive Bi M.D.   On: 01/30/2023 16:44   MR BRAIN W WO CONTRAST Result Date: 01/30/2023 CLINICAL DATA:  Lung cancer staging. EXAM: MRI HEAD WITHOUT AND WITH CONTRAST TECHNIQUE: Multiplanar, multiecho pulse sequences of the brain and surrounding structures were obtained without and with intravenous contrast. CONTRAST:  8.18mL GADAVIST GADOBUTROL 1 MMOL/ML IV SOLN COMPARISON:  Head MRI 03/18/2022 FINDINGS: Brain: There is no evidence of  an acute infarct, mass, midline shift, or extra-axial fluid collection. A few chronic cerebral microhemorrhages are again noted. There is mild generalized cerebral atrophy. No significant white matter disease is seen for age. No abnormal enhancement is identified. Vascular: Major intracranial vascular flow voids are preserved. Skull and upper cervical spine: Unremarkable bone marrow signal. Sinuses/Orbits: Bilateral cataract extraction. Small mucous retention cyst in the right maxillary sinus. Clear mastoid air cells. Other: None. IMPRESSION: No evidence of intracranial metastases. Electronically Signed   By: Sebastian Ache M.D.   On: 01/30/2023 15:48   CT Chest W Contrast Result Date: 01/17/2023 CLINICAL DATA:  Staging non-small-cell lung cancer. Chemotherapy and XRT. Immunotherapy. Cough. Mid chest pain. Low back pain. * Tracking Code: Patrick * EXAM: CT CHEST, ABDOMEN, AND PELVIS WITH CONTRAST TECHNIQUE: Multidetector CT imaging of the chest, abdomen and pelvis was performed following the standard protocol during bolus administration of intravenous contrast. RADIATION DOSE REDUCTION: This exam was performed according to the departmental dose-optimization program which includes automated exposure control, adjustment of the mA and/or kV according to patient size and/or use of iterative reconstruction technique. CONTRAST:  OMNIPAQUE IOHEXOL 300 MG/ML  SOLN COMPARISON:  10/10/2022 and older. FINDINGS: CT CHEST FINDINGS Cardiovascular: Right upper chest port is accessed. Tip seen as far as the right atrium. Heart is nonenlarged. No significant pericardial effusion. Coronary artery calcifications are seen. The thoracic aorta has a normal course and caliber with scattered calcified plaque. Bovine type aortic arch, normal variant. Status post median sternotomy. Mediastinum/Nodes: Thyroid gland is unremarkable. The thoracic esophagus has a normal course and caliber. No specific abnormal lymph node enlargement identified  in the axillary region or hilum. Small upper mediastinal nodes identified but less than a cm short axis and unchanged. However there is a abnormal lymph node seen in the thoracic inlet on the right side immediately anterior to the posterior aspect of the right second rib on series 2, image 9 measuring 2.5 x 1.9 cm. Previously this same node would have added maximum diameter of 8 mm. Lungs/Pleura: Status post right upper lobectomy. There is associated right apical pleural thickening and scarring. Distortion suprahilar on the right with bronchiectasis stable. No new dominant mass lesion identified in the lungs. Once again there is a extensive centrilobular and paraseptal emphysematous changes. No pleural effusion, pneumothorax or consolidation. Musculoskeletal: Mild degenerative changes along the spine. CT ABDOMEN PELVIS FINDINGS Hepatobiliary: No focal liver abnormality is seen. No gallstones, gallbladder wall thickening, or biliary dilatation. Patent portal vein. Pancreas: Moderate atrophy of the pancreas.  No obvious mass  Spleen: Normal in size without focal abnormality. Adrenals/Urinary Tract: Adrenal glands are preserved. Mild areas of renal atrophy with some tiny subcentimeter cystic foci, Bosniak 2 lesions. No enhancing mass or collecting system dilatation. Preserved contours of the urinary bladder Stomach/Bowel: On this non oral contrast exam large bowel has a normal course and caliber with diffuse moderate colonic stool. Few scattered colonic diverticula. Normal appendix. Stomach and small bowel are nondilated. Vascular/Lymphatic: Diffuse vascular calcifications identified along the aorta and branch vessels. There is what appears to be a high-grade stenosis of the origin of the right renal artery. Please correlate with symptoms of hypertension. There is also areas of stenosis seen along the common iliac arteries, right worse than left. Please see coronal series 4, image 75. Preserved IVC. No specific abnormal  lymph node enlargement identified in the abdomen and pelvis. Reproductive: Enlarged prostate with mass effect along the base of the bladder with enlarged median lobe of the prostate. Please correlate with BPH symptoms. Please correlate with the patient's PSA Other: Small fat containing inguinal hernias. Musculoskeletal: Mild curvature of the lumbar spine. Scattered degenerative changes of the spine and pelvis. IMPRESSION: Developing abnormal lymph node along the thoracic inlet on the right side worrisome for malignant node. Recommend further evaluation. PET-CT scan may be useful as the next step in the workup. Otherwise stable posttreatment changes along the right upper thorax with scarring, pleural thickening. Diffuse atherosclerotic disease of the aorta and branch vessels with the areas of significant stenosis seen particularly along the origin of the right renal artery and common iliac vessels. Please correlate with any particular symptoms. Additional workup as clinically appropriate. Enlarged prostate with mass effect along the bladder. Please correlate with patient's PSA. Few scattered colonic diverticula. Findings will be called to the ordering service by the Radiology physician assistant team. Electronically Signed   By: Karen Kays M.D.   On: 01/17/2023 10:19   CT ABDOMEN PELVIS W CONTRAST Result Date: 01/17/2023 CLINICAL DATA:  Staging non-small-cell lung cancer. Chemotherapy and XRT. Immunotherapy. Cough. Mid chest pain. Low back pain. * Tracking Code: Patrick * EXAM: CT CHEST, ABDOMEN, AND PELVIS WITH CONTRAST TECHNIQUE: Multidetector CT imaging of the chest, abdomen and pelvis was performed following the standard protocol during bolus administration of intravenous contrast. RADIATION DOSE REDUCTION: This exam was performed according to the departmental dose-optimization program which includes automated exposure control, adjustment of the mA and/or kV according to patient size and/or use of iterative  reconstruction technique. CONTRAST:  OMNIPAQUE IOHEXOL 300 MG/ML  SOLN COMPARISON:  10/10/2022 and older. FINDINGS: CT CHEST FINDINGS Cardiovascular: Right upper chest port is accessed. Tip seen as far as the right atrium. Heart is nonenlarged. No significant pericardial effusion. Coronary artery calcifications are seen. The thoracic aorta has a normal course and caliber with scattered calcified plaque. Bovine type aortic arch, normal variant. Status post median sternotomy. Mediastinum/Nodes: Thyroid gland is unremarkable. The thoracic esophagus has a normal course and caliber. No specific abnormal lymph node enlargement identified in the axillary region or hilum. Small upper mediastinal nodes identified but less than a cm short axis and unchanged. However there is a abnormal lymph node seen in the thoracic inlet on the right side immediately anterior to the posterior aspect of the right second rib on series 2, image 9 measuring 2.5 x 1.9 cm. Previously this same node would have added maximum diameter of 8 mm. Lungs/Pleura: Status post right upper lobectomy. There is associated right apical pleural thickening and scarring. Distortion suprahilar on the  right with bronchiectasis stable. No new dominant mass lesion identified in the lungs. Once again there is a extensive centrilobular and paraseptal emphysematous changes. No pleural effusion, pneumothorax or consolidation. Musculoskeletal: Mild degenerative changes along the spine. CT ABDOMEN PELVIS FINDINGS Hepatobiliary: No focal liver abnormality is seen. No gallstones, gallbladder wall thickening, or biliary dilatation. Patent portal vein. Pancreas: Moderate atrophy of the pancreas.  No obvious mass Spleen: Normal in size without focal abnormality. Adrenals/Urinary Tract: Adrenal glands are preserved. Mild areas of renal atrophy with some tiny subcentimeter cystic foci, Bosniak 2 lesions. No enhancing mass or collecting system dilatation. Preserved contours  of the urinary bladder Stomach/Bowel: On this non oral contrast exam large bowel has a normal course and caliber with diffuse moderate colonic stool. Few scattered colonic diverticula. Normal appendix. Stomach and small bowel are nondilated. Vascular/Lymphatic: Diffuse vascular calcifications identified along the aorta and branch vessels. There is what appears to be a high-grade stenosis of the origin of the right renal artery. Please correlate with symptoms of hypertension. There is also areas of stenosis seen along the common iliac arteries, right worse than left. Please see coronal series 4, image 75. Preserved IVC. No specific abnormal lymph node enlargement identified in the abdomen and pelvis. Reproductive: Enlarged prostate with mass effect along the base of the bladder with enlarged median lobe of the prostate. Please correlate with BPH symptoms. Please correlate with the patient's PSA Other: Small fat containing inguinal hernias. Musculoskeletal: Mild curvature of the lumbar spine. Scattered degenerative changes of the spine and pelvis. IMPRESSION: Developing abnormal lymph node along the thoracic inlet on the right side worrisome for malignant node. Recommend further evaluation. PET-CT scan may be useful as the next step in the workup. Otherwise stable posttreatment changes along the right upper thorax with scarring, pleural thickening. Diffuse atherosclerotic disease of the aorta and branch vessels with the areas of significant stenosis seen particularly along the origin of the right renal artery and common iliac vessels. Please correlate with any particular symptoms. Additional workup as clinically appropriate. Enlarged prostate with mass effect along the bladder. Please correlate with patient's PSA. Few scattered colonic diverticula. Findings will be called to the ordering service by the Radiology physician assistant team. Electronically Signed   By: Karen Kays M.D.   On: 01/17/2023 10:19     ASSESSMENT AND PLAN: This is a very pleasant 78 years old white male with Stage IIIA (T1c, N 2, M0) adenocarcinoma in the right upper lobe as well as stage IA (T1c, N0, M0) squamous cell carcinoma involving the right upper lobe and stage IA (T1b, N0, M0) adenocarcinoma involving the left upper lobe diagnosed in June 2023. The patient is status post the following treatment: 1) status post neoadjuvant systemic chemotherapy with carboplatin, paclitaxel and nivolumab for 3 cycles in [facility] followed by right upper lobectomy with lymph node dissection at [facility] under the care of Dr. Glenna Durand with the final pathology showing residual disease with synchronous adenocarcinoma as well as squamous cell carcinoma in the right upper lobe and multiple mediastinal lymph node involvement performed on November 13, 2021. 2)  evidence for disease recurrence with involvement of 4R lymphadenopathy consistent with metastatic adenocarcinoma on February 26, 2022 biopsy. Molecular studies showed no actionable mutations and PD-L1 expression was 15% in the adenocarcinoma in 2% in the squamous cell carcinoma. 3) Concurrent chemotherapy with carboplatin for AUC of 5 and Alimta 500 Mg/M2 every 3 weeks for 2 cycles during the course of radiotherapy.  Last dose  was giving 04/08/2022. He tolerated his treatment well except for the chemotherapy-induced odynophagia in addition to fatigue and sore throat. He started treatment with consolidation immunotherapy with Imfinzi 1500 Mg IV every 4 weeks status post 1 cycle.  This was discontinued secondary to intolerance and severe diarrhea managed by his gastroenterologist Dr. Russella Dar. The patient had repeat PET scan performed recently.  I personally and independently reviewed the PET scan images and discussed the result with the patient today.     Recurrent Lung Adenocarcinoma Recurrent lung adenocarcinoma with a suspicious lesion in the upper chest, likely a local recurrence.  Previous treatment included carboplatin and paclitaxel chemotherapy and radiation therapy. Imaging suggests the lesion is cancerous, with no other suspicious findings. Experiencing odynophagia, likely a radiation side effect. Discussed localized radiation therapy (SBRT) as a treatment option, avoiding systemic chemotherapy and immunotherapy due to potential adverse effects and localized recurrence. Patient expressed concerns about cancer aggressiveness and radiation side effects, including swallowing and speech issues. Emphasized timely treatment and consultation with a radiation oncologist. Patient prefers to consult with both [facility] specialist and Dr. Roselind Messier before proceeding. - Refer to radiation oncologist (Dr. Roselind Messier) for evaluation and potential SBRT - Schedule video conference with [facility] specialist on February 11, 2023 then he will call if he needs to see Dr. Roselind Messier - Follow-up in three months with a chest scan to assess response to treatment  Gastrointestinal Symptoms Persistent stomach pain and weakness, worsened during previous immunotherapy. Normal gastroenterology evaluation. Thyroid issues ruled out. Suggested possibility of irritable bowel syndrome or other functional gastrointestinal disorders. Symptoms unlikely related to previous chemotherapy and radiation due to time elapsed. - Consider further evaluation if symptoms persist or worsen  General Health Maintenance Emphasized importance of regular follow-up and monitoring for new symptoms or recurrences. - Regular follow-up appointments - Monitor for new symptoms or changes in health status  Follow-up - Follow-up with radiation oncologist after video conference with [facility] specialist - Schedule chest scan in three months - Follow-up appointment in three months to review scan results and assess treatment response.   He was advised to call immediately if he has any other concerning symptoms in the interval.  The patient voices  understanding of current disease status and treatment options and is in agreement with the current care plan.  All questions were answered. The patient knows to call the clinic with any problems, questions or concerns. We can certainly see the patient much sooner if necessary.  The total time spent in the appointment was 30 minutes.  Disclaimer: This note was dictated with voice recognition software. Similar sounding words can inadvertently be transcribed and may not be corrected upon review.

## 2023-02-07 LAB — PROSTATE-SPECIFIC AG, SERUM (LABCORP): Prostate Specific Ag, Serum: 0.9 ng/mL (ref 0.0–4.0)

## 2023-02-07 LAB — T4: T4, Total: 7.8 ug/dL (ref 4.5–12.0)

## 2023-02-10 DIAGNOSIS — L110 Acquired keratosis follicularis: Secondary | ICD-10-CM | POA: Diagnosis not present

## 2023-02-10 DIAGNOSIS — M79674 Pain in right toe(s): Secondary | ICD-10-CM | POA: Diagnosis not present

## 2023-02-10 DIAGNOSIS — M79675 Pain in left toe(s): Secondary | ICD-10-CM | POA: Diagnosis not present

## 2023-02-10 DIAGNOSIS — I739 Peripheral vascular disease, unspecified: Secondary | ICD-10-CM | POA: Diagnosis not present

## 2023-02-10 DIAGNOSIS — M79671 Pain in right foot: Secondary | ICD-10-CM | POA: Diagnosis not present

## 2023-02-10 DIAGNOSIS — M79672 Pain in left foot: Secondary | ICD-10-CM | POA: Diagnosis not present

## 2023-02-10 DIAGNOSIS — L609 Nail disorder, unspecified: Secondary | ICD-10-CM | POA: Diagnosis not present

## 2023-02-11 ENCOUNTER — Telehealth: Payer: Self-pay | Admitting: *Deleted

## 2023-02-11 ENCOUNTER — Other Ambulatory Visit: Payer: Self-pay | Admitting: Internal Medicine

## 2023-02-11 DIAGNOSIS — C3492 Malignant neoplasm of unspecified part of left bronchus or lung: Secondary | ICD-10-CM | POA: Diagnosis not present

## 2023-02-11 DIAGNOSIS — C3491 Malignant neoplasm of unspecified part of right bronchus or lung: Secondary | ICD-10-CM

## 2023-02-11 NOTE — Telephone Encounter (Signed)
 Pt called to make office aware that he had spoken with his doctor at [facility], Dr. Gretta and he stated that he agreed with Dr. Sherrod to proceed with radiation. Made Dr.Mohamed aware of patients decision with going forward with radiation through inbasket and in office.

## 2023-02-13 ENCOUNTER — Ambulatory Visit: Payer: Medicare Other | Admitting: Internal Medicine

## 2023-02-13 ENCOUNTER — Ambulatory Visit
Admission: RE | Admit: 2023-02-13 | Discharge: 2023-02-13 | Disposition: A | Discharge status: Home / Self Care | Payer: Medicare Other | Source: Ambulatory Visit | Attending: Radiation Oncology | Admitting: Radiation Oncology

## 2023-02-13 ENCOUNTER — Encounter: Payer: Self-pay | Admitting: Radiation Oncology

## 2023-02-13 VITALS — BP 141/77 | HR 79 | Temp 97.4°F | Resp 20 | Ht 72.0 in | Wt 192.4 lb

## 2023-02-13 DIAGNOSIS — M8580 Other specified disorders of bone density and structure, unspecified site: Secondary | ICD-10-CM | POA: Insufficient documentation

## 2023-02-13 DIAGNOSIS — Z9221 Personal history of antineoplastic chemotherapy: Secondary | ICD-10-CM | POA: Diagnosis not present

## 2023-02-13 DIAGNOSIS — I2510 Atherosclerotic heart disease of native coronary artery without angina pectoris: Secondary | ICD-10-CM | POA: Diagnosis not present

## 2023-02-13 DIAGNOSIS — R109 Unspecified abdominal pain: Secondary | ICD-10-CM | POA: Diagnosis not present

## 2023-02-13 DIAGNOSIS — J449 Chronic obstructive pulmonary disease, unspecified: Secondary | ICD-10-CM | POA: Insufficient documentation

## 2023-02-13 DIAGNOSIS — R011 Cardiac murmur, unspecified: Secondary | ICD-10-CM | POA: Diagnosis not present

## 2023-02-13 DIAGNOSIS — Z800 Family history of malignant neoplasm of digestive organs: Secondary | ICD-10-CM | POA: Insufficient documentation

## 2023-02-13 DIAGNOSIS — C3491 Malignant neoplasm of unspecified part of right bronchus or lung: Secondary | ICD-10-CM

## 2023-02-13 DIAGNOSIS — Z8042 Family history of malignant neoplasm of prostate: Secondary | ICD-10-CM | POA: Insufficient documentation

## 2023-02-13 DIAGNOSIS — C3411 Malignant neoplasm of upper lobe, right bronchus or lung: Secondary | ICD-10-CM | POA: Diagnosis not present

## 2023-02-13 DIAGNOSIS — Z803 Family history of malignant neoplasm of breast: Secondary | ICD-10-CM | POA: Insufficient documentation

## 2023-02-13 DIAGNOSIS — I509 Heart failure, unspecified: Secondary | ICD-10-CM | POA: Insufficient documentation

## 2023-02-13 DIAGNOSIS — Z79899 Other long term (current) drug therapy: Secondary | ICD-10-CM | POA: Diagnosis not present

## 2023-02-13 DIAGNOSIS — Z951 Presence of aortocoronary bypass graft: Secondary | ICD-10-CM | POA: Insufficient documentation

## 2023-02-13 DIAGNOSIS — Z87891 Personal history of nicotine dependence: Secondary | ICD-10-CM | POA: Diagnosis not present

## 2023-02-13 DIAGNOSIS — I700 Atherosclerosis of aorta: Secondary | ICD-10-CM | POA: Diagnosis not present

## 2023-02-13 DIAGNOSIS — Z923 Personal history of irradiation: Secondary | ICD-10-CM | POA: Insufficient documentation

## 2023-02-13 DIAGNOSIS — I110 Hypertensive heart disease with heart failure: Secondary | ICD-10-CM | POA: Insufficient documentation

## 2023-02-13 DIAGNOSIS — R531 Weakness: Secondary | ICD-10-CM | POA: Diagnosis not present

## 2023-02-13 DIAGNOSIS — E785 Hyperlipidemia, unspecified: Secondary | ICD-10-CM | POA: Diagnosis not present

## 2023-02-13 NOTE — Progress Notes (Signed)
 Radiation Oncology         [phone] ________________________________  Initial out patient Re-Consultation  Name: Patrick Harris MRN: [id number]  Date: 02/13/2023  DOB: Jun 14, 1945  RR:Patrick Harris, Patrick PARAS, MD  Patrick Sherrod, MD   REFERRING PHYSICIAN: Sherrod Sherrod, MD  DIAGNOSIS: The encounter diagnosis was Non-small cell cancer of right lung Patrick Harris ([place]  Stage IIIA (T1c, N 2, M0) adenocarcinoma in the right upper lobe as well as stage IA (T1c, N0, M0) squamous cell carcinoma involving the right upper lobe and stage IA (T1b, N0, M0) adenocarcinoma involving the left upper lobe diagnosed in June 2023, now with an enlarged hypermetabolic high right paratracheal node.  HISTORY OF PRESENT ILLNESS::Patrick Harris is a 78 y.o. male who is accompanied by his supportive wife. he is seen as a courtesy of Patrick Harris for an opinion concerning radiation therapy as part of management for his paratracheal lymph node metastasis.  Patient has a history of adenocarcinoma involving the left upper lobe diagnosed in June 2023 along with a history of a pituitary tumor for which he was under the care of Patrick Harris for. Treatment for the lung adenocarcinoma consisted of chemotherapy with carboplatin  and Paclitaxel  concurrent with radiation therapy  He was last seen in office on 10-15-22 for a routine follow up following radiation treatment.   The patient presented for a routine CT chest on 01-07-23 showing an abnormal lymph node seen in the thoracic inlet on the right side immediately anterior to the posterior aspect of the right second rib measuring 2.5 x 1.9 cm, compared to 8 mm on previous exam. Scan also revealed diffuse atherosclerotic disease of the aorta and branch vessels with the areas of significant stenosis seen particularly along the origin of the right renal artery and common iliac vessels. CT abdomen/pelvis showed an enlarged prostate with mass effect along the base of the bladder with enlarged median  lobe of the prostate.  He presented for a follow up with Patrick Heilingoetter, PA-C on 01-22-23, where he complained of chronic fatigue and generalized weakness along with occasional sharp rib pain and umbilical pain. He reported dyspnea on exertion but states it was stable along with an intermittent cough. She recommended a PET and brain MRI for further evaluation. She also checked his PSA due to his enlarged prostate.  He presented for an brain MRI on 01-26-23 which showed no evidence of intracranial metastases. Restaging PET done on 01-30-23 revealed an intensely hypermetabolic high right paratracheal lymph node measuring 17 mm in the greatest extent with SUV of 13.8, consistent with recurrent nodal metastasis. Scan also showed indeterminate metabolic activity in the periphery of the left upper lung. No evidence of local recurrence in the right upper lobe was indicated.   At his most recent follow up with Patrick Harris on 02-06-23, he reported experiencing a burning sensation when swallowing and of a globus sensation along with occasional congestion. He attributed these symptoms to scarring from radiation treatment. He also complained of abdominal pain and weakness and altered bowel habits. Patrick. Sherrod recommended localized SBRT to hypermetabolic paratracheal node. He was kindly referred to us  today to discuss radiation therapy.   Today, the patient notes a tightness in his throat and a burning sensation with swallowing. This has been present since completing his radiation treatment last April. He has had an endoscopic exam by his Patrick Harris that showed no abnormalities. He also notes a dull pain on the right side of his chest.   PREVIOUS RADIATION THERAPY: Yes  Indication for treatment: Curative      Radiation treatment dates: 03/19/22 through 05/06/22 Site/dose: Chest - 60 Gy delivered in 30 Fx at 2 Gy/Fx Beams/energy: 6X, 10X Technique: 3D  PAST MEDICAL HISTORY:  Past Medical History:   Diagnosis Date   Arthritis    CAD (coronary artery disease)    CABG with LIMA to LAD and RIMA to RCA August 2000 - Patrick Harris   Cancer [facility])    basal cell   CHF (congestive heart failure) (HCC)    COPD (chronic obstructive pulmonary disease) (HCC)    Elevated glycated hemoglobin    Essential hypertension    GERD (gastroesophageal reflux disease)    Heart murmur    History of radiation therapy    Right lung(chest) 03/19/22-05/06/22- Patrick Harris   Hyperlipidemia    Hypopituitarism [facility])    Lung cancer (HCC)    Osteopenia    Patrick Harris   Pancreatitis    Peyronie disease    Testosterone  deficiency    Patrick Harris    PAST SURGICAL HISTORY: Past Surgical History:  Procedure Laterality Date   Bone spur  2006   5th toe bilaterally   BRONCHIAL BIOPSY  07/02/2021   Procedure: BRONCHIAL BIOPSIES;  Surgeon: Patrick Lamar RAMAN, MD;  Location[facility];  Service: Pulmonary;;   BRONCHIAL BRUSHINGS  07/02/2021   Procedure: BRONCHIAL BRUSHINGS;  Surgeon: Patrick Lamar RAMAN, MD;  Location[facility];  Service: Pulmonary;;   BRONCHIAL NEEDLE ASPIRATION BIOPSY  07/02/2021   Procedure: BRONCHIAL NEEDLE ASPIRATION BIOPSIES;  Surgeon: Patrick Lamar RAMAN, MD;  [facility];  Service: Pulmonary;;   COLONOSCOPY  2007   negative; Patrick Harris   CORONARY ARTERY BYPASS GRAFT  2001   2 vessels   FIDUCIAL MARKER PLACEMENT  07/02/2021   Procedure: FIDUCIAL MARKER PLACEMENT;  Surgeon: Patrick Lamar RAMAN, MD;  Location[facility];  Service: Pulmonary;;   fracture heel  1967   Bil/ due to fall off ladder   Fractured calcaneus  1969   bilaterally w/ ankle fusion   HERNIA REPAIR  1984   right inguinal hernia   KNEE SURGERY Left    LOBECTOMY Right 11/13/2021   MENISECTOMY  2007   R medial, Patrick. Barbarann [facility] CATH INSERTION     SHOULDER SURGERY  1994   left   UPPER GASTROINTESTINAL ENDOSCOPY  2007   GERD, Patrick Harris   VASECTOMY     VIDEO BRONCHOSCOPY WITH RADIAL ENDOBRONCHIAL  ULTRASOUND  07/02/2021   Procedure: VIDEO BRONCHOSCOPY WITH RADIAL ENDOBRONCHIAL ULTRASOUND;  Surgeon: Patrick Lamar RAMAN, MD;  Location: [facility] ENDOSCOPY;  Service: Pulmonary;;    FAMILY HISTORY:  Family History  Problem Relation Age of Onset   Diabetes Mother    Stroke Mother 25   Hypertension Mother    Heart attack Father 74   Leukemia Sister    Parkinson's disease Sister    Stroke Sister    Stroke Sister    Stroke Sister    Atrial fibrillation Sister    Prostate cancer Brother    Aortic aneurysm Brother        abdominal   Heart disease Brother    Atrial fibrillation Brother    Cancer Maternal Uncle         X3:prostate , renal, bone    Colon cancer Paternal Uncle    Breast cancer Daughter    Esophageal cancer Neg Hx    Rectal cancer Neg Hx    Stomach cancer Neg Hx  SOCIAL HISTORY:  Social History   Tobacco Use   Smoking status: Former    Current packs/day: 0.00    Types: Cigarettes    Quit date: 05/2021    Years since quitting: 1.7   Smokeless tobacco: Never  Vaping Use   Vaping status: Never Used  Substance Use Topics   Alcohol use: No   Drug use: No    ALLERGIES:  Allergies  Allergen Reactions   Latex Dermatitis    Contact  dermatitis   Bromocriptine Nausea Only    Dry heaves   Zestril  [Lisinopril ] Other (See Comments)    Leg pain, inc HR, dizziness, headache   Diovan  [Valsartan ] Nausea Only   Hydrochlorothiazide  Other (See Comments)    Possibly caused pancreatitis 2018   Norvasc  [Amlodipine ] Other (See Comments)    Edema    Zocor [Simvastatin]     Joint pain    MEDICATIONS:  Current Outpatient Medications  Medication Sig Dispense Refill   aspirin  EC 81 MG tablet Take 1 tablet (81 mg total) by mouth at bedtime. Okay to restart this medication on 07/03/2021 30 tablet 11   Cholecalciferol (VITAMIN D3) 2000 UNITS TABS Take 2,000 Units by mouth in the morning and at bedtime.     Coenzyme Q10 (COQ-10) 100 MG CAPS Take 100 mg by mouth in the morning.      finasteride (PROSCAR) 5 MG tablet Take 5 mg by mouth daily.     irbesartan  (AVAPRO ) 75 MG tablet TAKE 1 TABLET BY MOUTH EVERY DAY 90 tablet 1   nitroGLYCERIN  (NITROSTAT ) 0.4 MG SL tablet Place 1 tablet (0.4 mg total) under the tongue every 5 (five) minutes x 3 doses as needed for chest pain (if no relief after 2nd dose, proceed to ED or call 911). 25 tablet 3   Omega-3 Fatty Acids (OMEGA-3 FISH OIL PO) Take 1,000 mg by mouth in the morning.     omeprazole  (PRILOSEC ) 40 MG capsule Take 40 mg bid x 1 month then 40 mg once daily     rosuvastatin  (CRESTOR ) 20 MG tablet TAKE HALF A TABLET (10 MG) BY MOUTH ONCE DAILY. 45 tablet 3   sucralfate  (CARAFATE ) 1 g tablet Take 1 tablet (1 g total) by mouth 4 (four) times daily -  with meals and at bedtime. Crush and dissolve in 10 mL warm water prior to swallowing, take 20 min before meals 120 tablet 2   tamsulosin  (FLOMAX ) 0.4 MG CAPS capsule Take 0.4 mg by mouth in the morning.     Testosterone  20.25 MG/ACT (1.62%) GEL Apply 2 Pump topically in the morning.     vitamin C (ASCORBIC ACID) 500 MG tablet Take 500 mg by mouth in the morning.     No current facility-administered medications for this encounter.    REVIEW OF SYSTEMS: Notable for that above   PHYSICAL EXAM:  height is 6' (1.829 m) and weight is 192 lb 6.4 oz (87.3 kg). His temperature is 97.4 F (36.3 C) (abnormal). His blood pressure is 141/77 (abnormal) and his pulse is 79. His respiration is 20 and oxygen saturation is 96%.   General: Alert and oriented, in no acute distress HEENT: Head is normocephalic. Extraocular movements are intact. Oropharynx is clear. Neck: Neck is supple, no palpable cervical or supraclavicular lymphadenopathy. Heart: Regular in rate and rhythm with no murmurs, rubs, or gallops. Chest: Clear to auscultation bilaterally, with no rhonchi, wheezes, or rales. Abdomen: Soft, nontender, nondistended, with no rigidity or guarding. Extremities: No cyanosis or  edema. Lymphatics: see Neck Exam Skin: No concerning lesions. Musculoskeletal: symmetric strength and muscle tone throughout. Neurologic: Cranial nerves II through XII are grossly intact. No obvious focalities. Speech is fluent. Coordination is intact. Psychiatric: Judgment and insight are intact. Affect is appropriate.   ECOG = 1  0 - Asymptomatic (Fully active, able to carry on all predisease activities without restriction)  1 - Symptomatic but completely ambulatory (Restricted in physically strenuous activity but ambulatory and able to carry out work of a light or sedentary nature. For example, light housework, office work)  2 - Symptomatic, <50% in bed during the day (Ambulatory and capable of all self care but unable to carry out any work activities. Up and about more than 50% of waking hours)  3 - Symptomatic, >50% in bed, but not bedbound (Capable of only limited self-care, confined to bed or chair 50% or more of waking hours)  4 - Bedbound (Completely disabled. Cannot carry on any self-care. Totally confined to bed or chair)  5 - Death   Raylene MM, Creech RH, Tormey DC, et al. [phone]). Toxicity and response criteria of the [facility]. Am. DOROTHA Bridges. Oncol. 5 (6): 649-55  LABORATORY DATA:  Lab Results  Component Value Date   WBC 3.7 (L) 02/06/2023   HGB 14.2 02/06/2023   HCT 41.7 02/06/2023   MCV 95.0 02/06/2023   PLT 150 02/06/2023   NEUTROABS 2.3 02/06/2023   Lab Results  Component Value Date   NA 139 02/06/2023   K 4.4 02/06/2023   CL 107 02/06/2023   CO2 28 02/06/2023   GLUCOSE 127 (H) 02/06/2023   BUN 17 02/06/2023   CREATININE 0.97 02/06/2023   CALCIUM  9.6 02/06/2023      RADIOGRAPHY: NM PET Image Restag (PS) Skull Base To Thigh Result Date: 01/30/2023 CLINICAL DATA:  Subsequent treatment strategy for non-small cell lung cancer enlarging lymph node on CT exam. EXAM: NUCLEAR MEDICINE PET SKULL BASE TO THIGH TECHNIQUE: 9.45 mCi F-18 FDG  was injected intravenously. Full-ring PET imaging was performed from the skull base to thigh after the radiotracer. CT data was obtained and used for attenuation correction and anatomic localization. Fasting blood glucose: 99 mg/dl COMPARISON:  CT scan 87/68/7975 FINDINGS: NECK: No hypermetabolic lymph nodes in the neck. Incidental CT findings: None. CHEST: High RIGHT paratracheal node is again demonstrated measuring 17 mm short axis compared to 19 mm on recent CT scan. The node is intensely hypermetabolic with SUV max equal 13.8 (image 53). No additional hypermetabolic mediastinal lymph nodes. Post surgical radiation change in the RIGHT upper lobe with out evidence of recurrence within the pulmonary parenchyma. Calcified pleural plaque in the posterior RIGHT lower lobe. Focus of metabolic activity in the LEFT lung with SUV max equal 3.0 on image 71. There is ill-defined thickening along the pleural edge/subpleural cystic change. Incidental CT findings: None. ABDOMEN/PELVIS: No abnormal hypermetabolic activity within the liver, pancreas, adrenal glands, or spleen. No hypermetabolic lymph nodes in the abdomen or pelvis. Incidental CT findings: None. SKELETON: Subtle hypermetabolic activity in the posterior LEFT iliac bone with SUV max 3.5. No CT correlation. Photopenia through the thoracic spine related to radiation therapy. Incidental CT findings: None. IMPRESSION: 1. Intensely hypermetabolic high RIGHT paratracheal lymph node consistent with recurrent nodal metastasis. 2. No evidence of local recurrence in the RIGHT upper lobe. 3. Focus of metabolic activity in the periphery of the LEFT upper is indeterminate. Recommend attention on follow-up. Electronically Signed   By: Jackquline Malva HERO.D.  On: 01/30/2023 16:44   MR BRAIN W WO CONTRAST Result Date: 01/30/2023 CLINICAL DATA:  Lung cancer staging. EXAM: MRI HEAD WITHOUT AND WITH CONTRAST TECHNIQUE: Multiplanar, multiecho pulse sequences of the brain and  surrounding structures were obtained without and with intravenous contrast. CONTRAST:  8.40mL GADAVIST  GADOBUTROL  1 MMOL/ML IV SOLN COMPARISON:  Head MRI 03/18/2022 FINDINGS: Brain: There is no evidence of an acute infarct, mass, midline shift, or extra-axial fluid collection. A few chronic cerebral microhemorrhages are again noted. There is mild generalized cerebral atrophy. No significant white matter disease is seen for age. No abnormal enhancement is identified. Vascular: Major intracranial vascular flow voids are preserved. Skull and upper cervical spine: Unremarkable bone marrow signal. Sinuses/Orbits: Bilateral cataract extraction. Small mucous retention cyst in the right maxillary sinus. Clear mastoid air cells. Other: None. IMPRESSION: No evidence of intracranial metastases. Electronically Signed   By: Dasie Hamburg M.D.   On: 01/30/2023 15:48      IMPRESSION: Stage IIIA (T1c, N 2, M0) adenocarcinoma in the right upper lobe as well as stage IA (T1c, N0, M0) squamous cell carcinoma involving the right upper lobe and stage IA (T1b, N0, M0) adenocarcinoma involving the left upper lobe diagnosed in June 2023, now with an enlarged hypermetabolic paratracheal node.  We have reviewed the patient's case and pertinent imaging. Most recent PET demonstrates a hypermetabolic high right paratracheal node, concerning for metastatic disease. This node is just above his previous radiation treatment field. Patrick. Shannon recommends targeted radiation to the right paratracheal node to provide locoregional disease control.   Today, I talked to the patient and family about the findings and work-up thus far.  We discussed the natural history of enlarging lymph nodes and general treatment, highlighting the role of radiotherapy in the management.  We discussed the available radiation techniques, and focused on the details of logistics and delivery.  We reviewed the anticipated acute and late sequelae associated with radiation in  this setting.  The patient was encouraged to ask questions that I answered to the best of my ability. A patient consent form was discussed and signed.  We retained a copy for our records.  The patient would like to proceed with radiation and will be scheduled for CT simulation.  PLAN: Patient is scheduled for CT simulation on 02/18/23. Anticipate 10-15 fractions directed at the right paratracheal lymph node.    40 minutes of total time was spent for this patient encounter, including preparation, face-to-face counseling with the patient and coordination of care, physical exam, and documentation of the encounter.   ------------------------------------------------   Leeroy Due, PA-C   Lynwood CHARM Shannon, PhD, MD   [facility]  Radiation Oncology Direct Dial: [phone]  Fax: [phone] [email or web address]    This document serves as a record of services personally performed by Lynwood Shannon, MD and Leeroy Due, PA-C. It was created on his behalf by Reymundo Cartwright, a trained medical scribe. The creation of this record is based on the scribe's personal observations and the provider's statements to them. This document has been checked and approved by the attending provider.

## 2023-02-13 NOTE — Progress Notes (Signed)
 Location of tumor and Histology per Pathology Report:  Right lung with paratracheal lymph node mets  Biopsy:   Past/Anticipated interventions by surgeon, if any:   Past/Anticipated interventions by medical oncology, if any: Dr. Sherrod      Pain issues, if any:  Yes, he reports a  dull pain on right side of chest.  SAFETY ISSUES: Prior radiation? Yes Pacemaker/ICD? No  Possible current pregnancy? N/A Is the patient on methotrexate? No        BP (!) 141/77 (BP Location: Right Arm, Patient Position: Sitting, Cuff Size: Large)   Pulse 79   Temp (!) 97.4 F (36.3 C)   Resp 20   Ht 6' (1.829 m)   Wt 192 lb 6.4 oz (87.3 kg)   SpO2 96%   BMI 26.09 kg/m

## 2023-02-18 ENCOUNTER — Ambulatory Visit
Admission: RE | Admit: 2023-02-18 | Discharge: 2023-02-18 | Disposition: A | Discharge status: Home / Self Care | Payer: Medicare Other | Source: Ambulatory Visit | Attending: Radiation Oncology | Admitting: Radiation Oncology

## 2023-02-18 DIAGNOSIS — C3491 Malignant neoplasm of unspecified part of right bronchus or lung: Secondary | ICD-10-CM

## 2023-02-18 DIAGNOSIS — Z510 Encounter for antineoplastic radiation therapy: Secondary | ICD-10-CM | POA: Insufficient documentation

## 2023-02-18 DIAGNOSIS — C3411 Malignant neoplasm of upper lobe, right bronchus or lung: Secondary | ICD-10-CM | POA: Insufficient documentation

## 2023-02-18 DIAGNOSIS — C771 Secondary and unspecified malignant neoplasm of intrathoracic lymph nodes: Secondary | ICD-10-CM | POA: Diagnosis not present

## 2023-02-18 DIAGNOSIS — Z87891 Personal history of nicotine dependence: Secondary | ICD-10-CM | POA: Diagnosis not present

## 2023-02-18 DIAGNOSIS — R590 Localized enlarged lymph nodes: Secondary | ICD-10-CM | POA: Diagnosis not present

## 2023-02-23 DIAGNOSIS — Z510 Encounter for antineoplastic radiation therapy: Secondary | ICD-10-CM | POA: Diagnosis not present

## 2023-02-23 DIAGNOSIS — C3411 Malignant neoplasm of upper lobe, right bronchus or lung: Secondary | ICD-10-CM | POA: Diagnosis not present

## 2023-02-23 DIAGNOSIS — Z87891 Personal history of nicotine dependence: Secondary | ICD-10-CM | POA: Diagnosis not present

## 2023-02-23 DIAGNOSIS — R590 Localized enlarged lymph nodes: Secondary | ICD-10-CM | POA: Diagnosis not present

## 2023-02-23 DIAGNOSIS — C771 Secondary and unspecified malignant neoplasm of intrathoracic lymph nodes: Secondary | ICD-10-CM | POA: Diagnosis not present

## 2023-02-25 ENCOUNTER — Ambulatory Visit
Admission: RE | Admit: 2023-02-25 | Discharge: 2023-02-25 | Disposition: A | Discharge status: Home / Self Care | Payer: Medicare Other | Source: Ambulatory Visit | Attending: Radiation Oncology | Admitting: Radiation Oncology

## 2023-02-25 ENCOUNTER — Other Ambulatory Visit: Payer: Self-pay

## 2023-02-25 ENCOUNTER — Other Ambulatory Visit: Payer: Self-pay | Admitting: Radiation Oncology

## 2023-02-25 DIAGNOSIS — C3491 Malignant neoplasm of unspecified part of right bronchus or lung: Secondary | ICD-10-CM

## 2023-02-25 DIAGNOSIS — C771 Secondary and unspecified malignant neoplasm of intrathoracic lymph nodes: Secondary | ICD-10-CM | POA: Diagnosis not present

## 2023-02-25 DIAGNOSIS — R590 Localized enlarged lymph nodes: Secondary | ICD-10-CM | POA: Diagnosis not present

## 2023-02-25 DIAGNOSIS — C3411 Malignant neoplasm of upper lobe, right bronchus or lung: Secondary | ICD-10-CM | POA: Diagnosis not present

## 2023-02-25 DIAGNOSIS — Z510 Encounter for antineoplastic radiation therapy: Secondary | ICD-10-CM | POA: Diagnosis not present

## 2023-02-25 DIAGNOSIS — Z87891 Personal history of nicotine dependence: Secondary | ICD-10-CM | POA: Diagnosis not present

## 2023-02-25 LAB — RAD ONC ARIA SESSION SUMMARY
Course Elapsed Days: 0
Plan Fractions Treated to Date: 1
Plan Prescribed Dose Per Fraction: 2 Gy
Plan Total Fractions Prescribed: 15
Plan Total Prescribed Dose: 30 Gy
Reference Point Dosage Given to Date: 2 Gy
Reference Point Session Dosage Given: 2 Gy
Session Number: 1

## 2023-02-25 MED ORDER — SUCRALFATE 1 G PO TABS: 1.0000 g | ORAL_TABLET | Freq: Three times a day (TID) | ORAL | 2 refills | Status: DC

## 2023-02-25 NOTE — Progress Notes (Unsigned)
Gastroenterology Return Visit   Referring Provider [name], MD [address] [place],  [place] [address]  Primary Care Provider Lawerance Bach, Bobette Mo, MD  Patient Profile: Patrick Harris is a 78 y.o. male with a past medical history noteworthy for RUL adenocarcinoma and squamous cell carcinoma - post chemotherapy, radiation, immunotherapy, surgery, atherosclerosis, peripheral neuropathy, pituitary insufficiency who returns to the [facility] for follow-up of the problem(s) noted below.  Problem List: Dysphagia, odynophagia, GERD, history esophageal ulcer d/t radiation, 2024 - R/O stricture, persistent esoph ulcer, esophagitis, candida esophagitis Personal history of adenomatous colon polyps History of periumbilical abdominal pain - suspected Imfinzi side effect History of erosive gastritis, 06/2022 -resolved on EGD 12/2022 Constipation  History of Present Illness   [name] was last seen in the GI office 10/29/2022 by Dr. Russella Dar   Current GI Meds  Omeprazole 40 mg orally twice daily Sucralfate  Interval History   Dysphagia/odynophagia At the time of Mr. Percival last clinic visit he was endorsing ongoing dysphagia and odynophagia EGD 12/2022 - 2 esophageal stenoses at 26 cm and GE junction status post savory dilation to 17 mm, small HH States that his swallowing is improved at this juncture  Abdominal pain Continuing to struggle with mid/epigastric abdominal pain since May 2024 Correlates the onset of pain with immunotherapy in the form of Imfinzi Reports that in the past he would experience a type of "hunger pain" and weakness that would improve after eating' He feels no relief despite eating food Nausea or vomiting Denies any improvement with omeprazole, Carafate or Fdgard; no record of trialing dicyclomine EGD and CT scan 12/2022 unrevealing States he is starting another round of radiation at this time  Constipation Endorses mild  constipation and stools described as "small balls" Has been using Metamucil and MiraLAX resulting in more regular bowel movements He does not feel that the severity of this to patient correlates with his abdominal pain  History of adenomatous colon polyps Colonoscopy 12/2022 - 3 sessile polyps in TC, left colon diverticula, EH, IH Pathology letter recommended surveillance colonoscopy in 3 years -he would be 78 years of age at that time Discussed that depending upon health issues could consider ceasing further surveillance colonoscopies   Last colonoscopy: 12/2022 -3 sessile polyps in TC, left colon diverticula, EH, IH Last endoscopy: 12/2022 -2 esophageal stenoses at 26 cm and GE junction status post savory dilation to 17 mm, small HH  Last Abd CT/CTE/MRE: CTAP 12/2022 -no intra-abdominal abnormalities  GI Review of Symptoms Significant for abdominal pain, constipation. Otherwise negative.  General Review of Systems  Review of systems is significant for the pertinent positives and negatives as listed per the HPI.  Full ROS is otherwise negative.  Past Medical History   Past Medical History:  Diagnosis Date   Arthritis    CAD (coronary artery disease)    CABG with LIMA to LAD and RIMA to RCA August 2000 - Dr. Dorris Fetch   Cancer [facility])    basal cell   CHF (congestive heart failure) (HCC)    COPD (chronic obstructive pulmonary disease) (HCC)    Elevated glycated hemoglobin    Essential hypertension    GERD (gastroesophageal reflux disease)    Heart murmur    History of radiation therapy    Right lung(chest) 03/19/22-05/06/22- Dr. Antony Blackbird   Hyperlipidemia    Hypopituitarism [facility])    Lung cancer (HCC)    Osteopenia    Dr Juleen China   Pancreatitis  Peyronie disease    Testosterone deficiency    Dr Juleen China     Past Surgical History   Past Surgical History:  Procedure Laterality Date   Bone spur  2006   5th toe bilaterally   BRONCHIAL BIOPSY  07/02/2021   Procedure:  BRONCHIAL BIOPSIES;  Surgeon: Leslye Peer, MD;  Location: [facility] ENDOSCOPY;  Service: Pulmonary;;   BRONCHIAL BRUSHINGS  07/02/2021   Procedure: BRONCHIAL BRUSHINGS;  Surgeon: Leslye Peer, MD;  Location: [facility] ENDOSCOPY;  Service: Pulmonary;;   BRONCHIAL NEEDLE ASPIRATION BIOPSY  07/02/2021   Procedure: BRONCHIAL NEEDLE ASPIRATION BIOPSIES;  Surgeon: Leslye Peer, MD;  Location: [facility] ENDOSCOPY;  Service: Pulmonary;;   COLONOSCOPY  2007   negative; Dr Jarold Motto   CORONARY ARTERY BYPASS GRAFT  2001   2 vessels   FIDUCIAL MARKER PLACEMENT  07/02/2021   Procedure: FIDUCIAL MARKER PLACEMENT;  Surgeon: Leslye Peer, MD;  Location: [facility] ENDOSCOPY;  Service: Pulmonary;;   fracture heel  1967   Bil/ due to fall off ladder   Fractured calcaneus  1969   bilaterally w/ ankle fusion   HERNIA REPAIR  1984   right inguinal hernia   KNEE SURGERY Left    LOBECTOMY Right 11/13/2021   MENISECTOMY  2007   R medial, Dr. Ophelia Charter   PORTA CATH INSERTION     SHOULDER SURGERY  1994   left   UPPER GASTROINTESTINAL ENDOSCOPY  2007   GERD, Dr Jarold Motto   VASECTOMY     VIDEO BRONCHOSCOPY WITH RADIAL ENDOBRONCHIAL ULTRASOUND  07/02/2021   Procedure: VIDEO BRONCHOSCOPY WITH RADIAL ENDOBRONCHIAL ULTRASOUND;  Surgeon: Leslye Peer, MD;  Location: [facility] ENDOSCOPY;  Service: Pulmonary;;     Allergies and Medications   Allergies  Allergen Reactions   Latex Dermatitis    Contact  dermatitis   Bromocriptine Nausea Only    Dry heaves   Zestril [Lisinopril] Other (See Comments)    Leg pain, inc HR, dizziness, headache   Diovan [Valsartan] Nausea Only   Hydrochlorothiazide Other (See Comments)    Possibly caused pancreatitis 2018   Norvasc [Amlodipine] Other (See Comments)    Edema    Zocor [Simvastatin]     Joint pain    Current Meds  Medication Sig   amitriptyline (ELAVIL) 10 MG tablet Take 1 tablet (10 mg total) by mouth at bedtime.   aspirin EC 81 MG tablet Take 1 tablet (81 mg total) by mouth at  bedtime. Okay to restart this medication on 07/03/2021   Cholecalciferol (VITAMIN D3) 2000 UNITS TABS Take 2,000 Units by mouth in the morning and at bedtime.   Coenzyme Q10 (COQ-10) 100 MG CAPS Take 100 mg by mouth in the morning.   finasteride (PROSCAR) 5 MG tablet Take 5 mg by mouth daily.   irbesartan (AVAPRO) 75 MG tablet TAKE 1 TABLET BY MOUTH EVERY DAY   Omega-3 Fatty Acids (OMEGA-3 FISH OIL PO) Take 1,000 mg by mouth in the morning.   omeprazole (PRILOSEC) 40 MG capsule Take 40 mg bid x 1 month then 40 mg once daily   polyethylene glycol (MIRALAX / GLYCOLAX) 17 g packet Take 17 g by mouth as needed.   rosuvastatin (CRESTOR) 20 MG tablet TAKE HALF A TABLET (10 MG) BY MOUTH ONCE DAILY.   tamsulosin (FLOMAX) 0.4 MG CAPS capsule Take 0.4 mg by mouth in the morning.   Testosterone 20.25 MG/ACT (1.62%) GEL Apply 2 Pump topically in the morning.   vitamin C (ASCORBIC ACID) 500 MG tablet Take  500 mg by mouth in the morning.   Vitamin E (VITAMIN E/D-ALPHA NATURAL) 268 MG (400 UNIT) CAPS Take 1 tablet by mouth daily.     Family History   Family History  Problem Relation Age of Onset   Diabetes Mother    Stroke Mother 56   Hypertension Mother    Heart attack Father 51   Leukemia Sister    Parkinson's disease Sister    Stroke Sister    Stroke Sister    Stroke Sister    Atrial fibrillation Sister    Prostate cancer Brother    Aortic aneurysm Brother        abdominal   Heart disease Brother    Atrial fibrillation Brother    Cancer Maternal Uncle         X3:prostate , renal, bone    Colon cancer Paternal Uncle    Breast cancer Daughter    Esophageal cancer Neg Hx    Rectal cancer Neg Hx    Stomach cancer Neg Hx     Social History   Social History   Tobacco Use   Smoking status: Former    Current packs/day: 0.00    Types: Cigarettes    Quit date: 05/2021    Years since quitting: 1.8   Smokeless tobacco: Never  Vaping Use   Vaping status: Never Used  Substance Use Topics    Alcohol use: No   Drug use: No   Jhoan reports that he quit smoking about 21 months ago. His smoking use included cigarettes. He has never used smokeless tobacco. He reports that he does not drink alcohol and does not use drugs.  Vital Signs and Physical Examination   Vitals:   02/26/23 0835  BP: 124/64  Pulse: 80   Body mass index is 27.13 kg/m. Weight: 194 lb 8 oz (88.2 kg)  General: Well developed, well nourished, no acute distress Head: Normocephalic and atraumatic Eyes: Sclerae anicteric, EOMI Lungs: Clear throughout to auscultation Heart: Regular rate and rhythm; No murmurs, rubs or bruits Abdomen: Soft, non tender and non distended. No masses, hepatosplenomegaly or hernias noted. Normal Bowel sounds Rectal:Deferred Musculoskeletal: Symmetrical with no gross deformities    Review of Data  The following data was reviewed at the time of this encounter:  Laboratory Studies      Latest Ref Rng & Units 02/06/2023   10:00 AM 01/07/2023    8:53 AM 10/10/2022    8:54 AM  CBC  WBC 4.0 - 10.5 K/uL 3.7  3.9  3.7   Hemoglobin 13.0 - 17.0 g/dL 14.7  82.9  56.2   Hematocrit 39.0 - 52.0 % 41.7  38.6  39.2   Platelets 150 - 400 K/uL 150  151  133     Lab Results  Component Value Date   LIPASE <10 (L) 06/21/2022      Latest Ref Rng & Units 02/06/2023   10:00 AM 01/07/2023    8:53 AM 10/10/2022    8:54 AM  CMP  Glucose 70 - 99 mg/dL 130  865  784   BUN 8 - 23 mg/dL 17  18  18    Creatinine 0.61 - 1.24 mg/dL 6.96  2.95  2.84   Sodium 135 - 145 mmol/L 139  140  140   Potassium 3.5 - 5.1 mmol/L 4.4  3.9  4.0   Chloride 98 - 111 mmol/L 107  107  106   CO2 22 - 32 mmol/L 28  29  30  Calcium 8.9 - 10.3 mg/dL 9.6  9.2  9.4   Total Protein 6.5 - 8.1 g/dL 6.9  6.6  6.7   Total Bilirubin 0.0 - 1.2 mg/dL 0.5  0.6  0.6   Alkaline Phos 38 - 126 U/L 65  60  58   AST 15 - 41 U/L 18  17  18    ALT 0 - 44 U/L 14  16  13       Imaging Studies  CT Chest/Abd/Pelvis  12/2022 Developing abnormal lymph node along the thoracic inlet on the right side worrisome for malignant node. Recommend further evaluation. PET-CT scan may be useful as the next step in the workup.   Otherwise stable posttreatment changes along the right upper thorax with scarring, pleural thickening.   Diffuse atherosclerotic disease of the aorta and branch vessels with the areas of significant stenosis seen particularly along the origin of the right renal artery and common iliac vessels. Please correlate with any particular symptoms. Additional workup as clinically appropriate.   Enlarged prostate with mass effect along the bladder. Please correlate with patient's PSA.   Few scattered colonic diverticula.   GI Procedures and Studies  EGD/colonoscopy 12/2022 EGD - 2 esophageal stenoses at 26 cm and GE junction status post savory dilation to 17 mm, small HH Colonoscopy - 3 sessile polyps (TA) in TC , left colon diverticula, EH, IH  EGD 06/2022 1 superficial esophageal ulcer without bleeding 26 cm from the incisors, gastric erosion, small HH Path: Reactive gastropathy, acute esophagitis with ulceration, negative for CMV; HSV equivocal  EGD 03/2019 Patchy mildly erythematous mucosa without bleeding in the gastric body and antrum Path: Chronic gastritis, H. pylori negative  Colonoscopy 08/2016 3 sessile polyps (TA) in the DC, TC; sigmoid diverticula, IH  Colonoscopy 07/2005 Normal Path: GERD   Clinical Impression  It is my clinical impression that Mr. Loescher is a 78 y.o. male with;  Dysphagia, odynophagia, GERD, history esophageal ulcer d/t radiation, 2024 - R/O stricture, persistent esoph ulcer, esophagitis, candida esophagitis Personal history of adenomatous colon polyps History of periumbilical abdominal pain - suspected Imfinzi side effect History of erosive gastritis, 06/2022 -resolved on EGD 12/2022 Constipation  Mr. Naclerio is to the office today to follow-up  multiple gastrointestinal issues outlined above.  Notes that his dysphagia and odynophagia related to radiation are improved since undergoing EGD with dilation in December 2024.  He is continuing on twice daily PPI as well as Carafate.  He is resuming another round of radiation which could contribute to recurrent symptoms and will need to be monitored.  His primary complaint today is ongoing mid/epigastric abdominal pain that has been persistent since May 2024.  He associates the onset of pain with initiation of immunotherapy in the form of Imfinzi.  He has not found relief with PPI, Carafate, FDgard.  Has not trialed dicyclomine although he is not describing the pain as cramping in nature.  We discussed the possibility of utilizing medication for neuropathic pain and I have suggested a trial of low-dose amitriptyline 10 mg p.o. nightly with dose titration over time as needed.  EKG within the last year does not show prolonged QT.  Reviewed potential sedating side effects of amitriptyline.  He has been experiencing constipation that he describes as mild.  He has been able to mitigate this with the use of MiraLAX and Metamucil.  He does not feel that the severity of his constipation explains his abdominal pain or discomfort.  Plan  Continue omeprazole 40 mg p.o.  twice daily Continue Carafate Start amitriptyline 10 mg p.o. nightly.  I have requested that he contact my office within 1 to 2 weeks reporting on his symptoms.  If needed we can further dose titrate up to 25 mg p.o. nightly.  Dicyclomine could be another consideration. Monitor symptoms of dysphagia and odynophagia with resumption of radiation therapy. Continue dietary modification, Metamucil and MiraLAX for management of constipation. Monitor weight and anthropometrics  Planned Follow Up  2 months  The patient or caregiver verbalized understanding of the material covered, with no barriers to understanding. All questions were answered.  Patient or caregiver is agreeable with the plan outlined above.    It was a pleasure to see Destry.  If you have any questions or concerns regarding this evaluation, do not hesitate to contact me.  Maren Beach, MD [facility]   I spent total of 35 minutes in both face-to-face and non-face-to-face activities, excluding procedures performed, for the visit on the date of this encounter.

## 2023-02-26 ENCOUNTER — Encounter: Payer: Self-pay | Admitting: Pediatrics

## 2023-02-26 ENCOUNTER — Ambulatory Visit: Payer: Medicare Other | Admitting: Pediatrics

## 2023-02-26 ENCOUNTER — Other Ambulatory Visit: Payer: Self-pay

## 2023-02-26 ENCOUNTER — Ambulatory Visit
Admission: RE | Admit: 2023-02-26 | Discharge: 2023-02-26 | Disposition: A | Discharge status: Home / Self Care | Payer: Medicare Other | Source: Ambulatory Visit | Attending: Radiation Oncology | Admitting: Radiation Oncology

## 2023-02-26 VITALS — BP 124/64 | HR 80 | Ht 71.0 in | Wt 194.5 lb

## 2023-02-26 DIAGNOSIS — R1013 Epigastric pain: Secondary | ICD-10-CM

## 2023-02-26 DIAGNOSIS — R1310 Dysphagia, unspecified: Secondary | ICD-10-CM

## 2023-02-26 DIAGNOSIS — Z860101 Personal history of adenomatous and serrated colon polyps: Secondary | ICD-10-CM | POA: Diagnosis not present

## 2023-02-26 DIAGNOSIS — K222 Esophageal obstruction: Secondary | ICD-10-CM

## 2023-02-26 DIAGNOSIS — C771 Secondary and unspecified malignant neoplasm of intrathoracic lymph nodes: Secondary | ICD-10-CM | POA: Diagnosis not present

## 2023-02-26 DIAGNOSIS — C3411 Malignant neoplasm of upper lobe, right bronchus or lung: Secondary | ICD-10-CM | POA: Diagnosis not present

## 2023-02-26 DIAGNOSIS — Z510 Encounter for antineoplastic radiation therapy: Secondary | ICD-10-CM | POA: Diagnosis not present

## 2023-02-26 DIAGNOSIS — K5900 Constipation, unspecified: Secondary | ICD-10-CM

## 2023-02-26 DIAGNOSIS — R590 Localized enlarged lymph nodes: Secondary | ICD-10-CM | POA: Diagnosis not present

## 2023-02-26 DIAGNOSIS — Z87891 Personal history of nicotine dependence: Secondary | ICD-10-CM | POA: Diagnosis not present

## 2023-02-26 LAB — RAD ONC ARIA SESSION SUMMARY
Course Elapsed Days: 1
Plan Fractions Treated to Date: 2
Plan Prescribed Dose Per Fraction: 2 Gy
Plan Total Fractions Prescribed: 15
Plan Total Prescribed Dose: 30 Gy
Reference Point Dosage Given to Date: 4 Gy
Reference Point Session Dosage Given: 2 Gy
Session Number: 2

## 2023-02-26 MED ORDER — AMITRIPTYLINE HCL 10 MG PO TABS: 10.0000 mg | ORAL_TABLET | Freq: Every day | ORAL | 1 refills | Status: DC

## 2023-02-26 NOTE — Patient Instructions (Addendum)
We have sent the following medications to your pharmacy for you to pick up at your convenience: Amitriptyline 10 mg.  Please contact office in a few weeks with updates on symptoms.  You have been scheduled for a follow up appointment on 4/16 at 3:00 PM. Please arrive 15 minutes early for registration. If you need to reschedule or cancel this appointment please call [phone] as soon as possible. Thank you.  _______________________________________________________  If your blood pressure at your visit was 140/90 or greater, please contact your primary care physician to follow up on this.  _______________________________________________________  If you are age 12 or older, your body mass index should be between 23-30. Your Body mass index is 27.13 kg/m. If this is out of the aforementioned range listed, please consider follow up with your Primary Care Provider.  If you are age 36 or younger, your body mass index should be between 19-25. Your Body mass index is 27.13 kg/m. If this is out of the aformentioned range listed, please consider follow up with your Primary Care Provider.   ________________________________________________________  The [facility] providers would like to encourage you to use [facility] to communicate with providers for non-urgent requests or questions.  Due to long hold times on the telephone, sending your provider a message by [facility] may be a faster and more efficient way to get a response.  Please allow 48 business hours for a response.  Please remember that this is for non-urgent requests.  _______________________________________________________  Thank you for entrusting me with your care and for choosing [facility], Dr. Maren Beach

## 2023-02-27 ENCOUNTER — Ambulatory Visit
Admission: RE | Admit: 2023-02-27 | Discharge: 2023-02-27 | Disposition: A | Discharge status: Home / Self Care | Payer: Medicare Other | Source: Ambulatory Visit | Attending: Radiation Oncology | Admitting: Radiation Oncology

## 2023-02-27 ENCOUNTER — Other Ambulatory Visit: Payer: Self-pay

## 2023-02-27 ENCOUNTER — Encounter: Payer: Self-pay | Admitting: Pediatrics

## 2023-02-27 DIAGNOSIS — Z510 Encounter for antineoplastic radiation therapy: Secondary | ICD-10-CM | POA: Diagnosis not present

## 2023-02-27 DIAGNOSIS — C771 Secondary and unspecified malignant neoplasm of intrathoracic lymph nodes: Secondary | ICD-10-CM | POA: Diagnosis not present

## 2023-02-27 DIAGNOSIS — C3411 Malignant neoplasm of upper lobe, right bronchus or lung: Secondary | ICD-10-CM | POA: Diagnosis not present

## 2023-02-27 DIAGNOSIS — Z87891 Personal history of nicotine dependence: Secondary | ICD-10-CM | POA: Diagnosis not present

## 2023-02-27 DIAGNOSIS — R590 Localized enlarged lymph nodes: Secondary | ICD-10-CM | POA: Diagnosis not present

## 2023-02-27 LAB — RAD ONC ARIA SESSION SUMMARY
Course Elapsed Days: 2
Plan Fractions Treated to Date: 3
Plan Prescribed Dose Per Fraction: 2 Gy
Plan Total Fractions Prescribed: 15
Plan Total Prescribed Dose: 30 Gy
Reference Point Dosage Given to Date: 6 Gy
Reference Point Session Dosage Given: 2 Gy
Session Number: 3

## 2023-02-28 ENCOUNTER — Other Ambulatory Visit: Payer: Self-pay

## 2023-02-28 ENCOUNTER — Ambulatory Visit
Admission: RE | Admit: 2023-02-28 | Discharge: 2023-02-28 | Disposition: A | Discharge status: Home / Self Care | Payer: Medicare Other | Source: Ambulatory Visit | Attending: Radiation Oncology | Admitting: Radiation Oncology

## 2023-02-28 DIAGNOSIS — R590 Localized enlarged lymph nodes: Secondary | ICD-10-CM | POA: Diagnosis not present

## 2023-02-28 DIAGNOSIS — Z510 Encounter for antineoplastic radiation therapy: Secondary | ICD-10-CM | POA: Diagnosis not present

## 2023-02-28 DIAGNOSIS — C3411 Malignant neoplasm of upper lobe, right bronchus or lung: Secondary | ICD-10-CM | POA: Diagnosis not present

## 2023-02-28 DIAGNOSIS — Z87891 Personal history of nicotine dependence: Secondary | ICD-10-CM | POA: Diagnosis not present

## 2023-02-28 DIAGNOSIS — C771 Secondary and unspecified malignant neoplasm of intrathoracic lymph nodes: Secondary | ICD-10-CM | POA: Diagnosis not present

## 2023-02-28 LAB — RAD ONC ARIA SESSION SUMMARY
Course Elapsed Days: 3
Plan Fractions Treated to Date: 4
Plan Prescribed Dose Per Fraction: 2 Gy
Plan Total Fractions Prescribed: 15
Plan Total Prescribed Dose: 30 Gy
Reference Point Dosage Given to Date: 8 Gy
Reference Point Session Dosage Given: 2 Gy
Session Number: 4

## 2023-03-03 ENCOUNTER — Other Ambulatory Visit: Payer: Self-pay

## 2023-03-03 ENCOUNTER — Ambulatory Visit
Admission: RE | Admit: 2023-03-03 | Discharge: 2023-03-03 | Disposition: A | Discharge status: Home / Self Care | Payer: Medicare Other | Source: Ambulatory Visit | Attending: Radiation Oncology | Admitting: Radiation Oncology

## 2023-03-03 DIAGNOSIS — Z87891 Personal history of nicotine dependence: Secondary | ICD-10-CM | POA: Diagnosis not present

## 2023-03-03 DIAGNOSIS — R590 Localized enlarged lymph nodes: Secondary | ICD-10-CM | POA: Diagnosis not present

## 2023-03-03 DIAGNOSIS — C3411 Malignant neoplasm of upper lobe, right bronchus or lung: Secondary | ICD-10-CM | POA: Diagnosis not present

## 2023-03-03 DIAGNOSIS — C771 Secondary and unspecified malignant neoplasm of intrathoracic lymph nodes: Secondary | ICD-10-CM | POA: Diagnosis not present

## 2023-03-03 DIAGNOSIS — Z510 Encounter for antineoplastic radiation therapy: Secondary | ICD-10-CM | POA: Diagnosis not present

## 2023-03-03 LAB — RAD ONC ARIA SESSION SUMMARY
Course Elapsed Days: 6
Plan Fractions Treated to Date: 5
Plan Prescribed Dose Per Fraction: 2 Gy
Plan Total Fractions Prescribed: 15
Plan Total Prescribed Dose: 30 Gy
Reference Point Dosage Given to Date: 10 Gy
Reference Point Session Dosage Given: 2 Gy
Session Number: 5

## 2023-03-04 ENCOUNTER — Ambulatory Visit
Admission: RE | Admit: 2023-03-04 | Discharge: 2023-03-04 | Disposition: A | Discharge status: Home / Self Care | Payer: Medicare Other | Source: Ambulatory Visit | Attending: Radiation Oncology | Admitting: Radiation Oncology

## 2023-03-04 ENCOUNTER — Other Ambulatory Visit: Payer: Self-pay

## 2023-03-04 ENCOUNTER — Encounter: Payer: Self-pay | Admitting: Internal Medicine

## 2023-03-04 DIAGNOSIS — Z87891 Personal history of nicotine dependence: Secondary | ICD-10-CM | POA: Diagnosis not present

## 2023-03-04 DIAGNOSIS — R590 Localized enlarged lymph nodes: Secondary | ICD-10-CM | POA: Diagnosis not present

## 2023-03-04 DIAGNOSIS — C3411 Malignant neoplasm of upper lobe, right bronchus or lung: Secondary | ICD-10-CM | POA: Diagnosis not present

## 2023-03-04 DIAGNOSIS — Z510 Encounter for antineoplastic radiation therapy: Secondary | ICD-10-CM | POA: Diagnosis not present

## 2023-03-04 DIAGNOSIS — C771 Secondary and unspecified malignant neoplasm of intrathoracic lymph nodes: Secondary | ICD-10-CM | POA: Diagnosis not present

## 2023-03-04 LAB — RAD ONC ARIA SESSION SUMMARY
Course Elapsed Days: 7
Plan Fractions Treated to Date: 6
Plan Prescribed Dose Per Fraction: 2 Gy
Plan Total Fractions Prescribed: 15
Plan Total Prescribed Dose: 30 Gy
Reference Point Dosage Given to Date: 12 Gy
Reference Point Session Dosage Given: 2 Gy
Session Number: 6

## 2023-03-04 NOTE — Patient Instructions (Addendum)
      Blood work was ordered.   Have this done if you want.      Medications changes include :   tizanidine - muscle relaxer       Return in about 6 months (around 09/02/2023) for Physical Exam.

## 2023-03-04 NOTE — Progress Notes (Unsigned)
 Subjective:    Patient ID: Patrick Harris, male    DOB: [birth year], 78 y.o.   MRN: [id number]     HPI Patrick Harris is here for follow up of his chronic medical problems.  ? Labs  Doing radiation treatment of his lung cancer.  Medications and allergies reviewed with patient and updated if appropriate.  Current Outpatient Medications on File Prior to Visit  Medication Sig Dispense Refill   amitriptyline (ELAVIL) 10 MG tablet Take 1 tablet (10 mg total) by mouth at bedtime. 90 tablet 1   aspirin EC 81 MG tablet Take 1 tablet (81 mg total) by mouth at bedtime. Okay to restart this medication on 07/03/2021 30 tablet 11   Cholecalciferol (VITAMIN D3) 2000 UNITS TABS Take 2,000 Units by mouth in the morning and at bedtime.     Coenzyme Q10 (COQ-10) 100 MG CAPS Take 100 mg by mouth in the morning.     finasteride (PROSCAR) 5 MG tablet Take 5 mg by mouth daily.     irbesartan (AVAPRO) 75 MG tablet TAKE 1 TABLET BY MOUTH EVERY DAY 90 tablet 1   nitroGLYCERIN (NITROSTAT) 0.4 MG SL tablet Place 1 tablet (0.4 mg total) under the tongue every 5 (five) minutes x 3 doses as needed for chest pain (if no relief after 2nd dose, proceed to ED or call 911). (Patient not taking: Reported on 02/26/2023) 25 tablet 3   Omega-3 Fatty Acids (OMEGA-3 FISH OIL PO) Take 1,000 mg by mouth in the morning.     omeprazole (PRILOSEC) 40 MG capsule Take 40 mg bid x 1 month then 40 mg once daily     polyethylene glycol (MIRALAX / GLYCOLAX) 17 g packet Take 17 g by mouth as needed.     rosuvastatin (CRESTOR) 20 MG tablet TAKE HALF A TABLET (10 MG) BY MOUTH ONCE DAILY. 45 tablet 3   sucralfate (CARAFATE) 1 g tablet Take 1 tablet (1 g total) by mouth 4 (four) times daily -  with meals and at bedtime. Crush and dissolve in 10 mL warm water prior to swallowing, take 20 min before meals (Patient not taking: Reported on 02/26/2023) 120 tablet 2   tamsulosin (FLOMAX) 0.4 MG CAPS capsule Take 0.4 mg by mouth in the morning.      Testosterone 20.25 MG/ACT (1.62%) GEL Apply 2 Pump topically in the morning.     vitamin C (ASCORBIC ACID) 500 MG tablet Take 500 mg by mouth in the morning.     Vitamin E (VITAMIN E/D-ALPHA NATURAL) 268 MG (400 UNIT) CAPS Take 1 tablet by mouth daily.     No current facility-administered medications on file prior to visit.     Review of Systems     Objective:  There were no vitals filed for this visit. BP Readings from Last 3 Encounters:  02/26/23 124/64  02/13/23 (!) 141/77  02/06/23 137/62   Wt Readings from Last 3 Encounters:  02/26/23 194 lb 8 oz (88.2 kg)  02/13/23 192 lb 6.4 oz (87.3 kg)  02/06/23 191 lb 3.2 oz (86.7 kg)   There is no height or weight on file to calculate BMI.    Physical Exam     Lab Results  Component Value Date   WBC 3.7 (L) 02/06/2023   HGB 14.2 02/06/2023   HCT 41.7 02/06/2023   PLT 150 02/06/2023   GLUCOSE 127 (H) 02/06/2023   CHOL 149 09/02/2022   TRIG 104.0 09/02/2022   HDL 34.20 (L) 09/02/2022  LDLCALC 94 09/02/2022   ALT 14 02/06/2023   AST 18 02/06/2023   NA 139 02/06/2023   K 4.4 02/06/2023   CL 107 02/06/2023   CREATININE 0.97 02/06/2023   BUN 17 02/06/2023   CO2 28 02/06/2023   TSH 1.849 02/06/2023   PSA 2.93 09/02/2022   INR 1.17 01/18/2015   HGBA1C 5.9 09/02/2022     Assessment & Plan:    See Problem List for Assessment and Plan of chronic medical problems.

## 2023-03-05 ENCOUNTER — Ambulatory Visit
Admission: RE | Admit: 2023-03-05 | Discharge: 2023-03-05 | Disposition: A | Discharge status: Home / Self Care | Payer: Medicare Other | Source: Ambulatory Visit | Attending: Radiation Oncology | Admitting: Radiation Oncology

## 2023-03-05 ENCOUNTER — Ambulatory Visit (INDEPENDENT_AMBULATORY_CARE_PROVIDER_SITE_OTHER): Payer: Medicare Other | Admitting: Internal Medicine

## 2023-03-05 ENCOUNTER — Encounter: Payer: Self-pay | Admitting: Internal Medicine

## 2023-03-05 ENCOUNTER — Other Ambulatory Visit: Payer: Self-pay

## 2023-03-05 VITALS — BP 126/84 | HR 70 | Wt 196.4 lb

## 2023-03-05 DIAGNOSIS — R7303 Prediabetes: Secondary | ICD-10-CM | POA: Diagnosis not present

## 2023-03-05 DIAGNOSIS — C3411 Malignant neoplasm of upper lobe, right bronchus or lung: Secondary | ICD-10-CM | POA: Diagnosis not present

## 2023-03-05 DIAGNOSIS — K219 Gastro-esophageal reflux disease without esophagitis: Secondary | ICD-10-CM

## 2023-03-05 DIAGNOSIS — C771 Secondary and unspecified malignant neoplasm of intrathoracic lymph nodes: Secondary | ICD-10-CM | POA: Diagnosis not present

## 2023-03-05 DIAGNOSIS — S29012A Strain of muscle and tendon of back wall of thorax, initial encounter: Secondary | ICD-10-CM

## 2023-03-05 DIAGNOSIS — I10 Essential (primary) hypertension: Secondary | ICD-10-CM | POA: Diagnosis not present

## 2023-03-05 DIAGNOSIS — Z87891 Personal history of nicotine dependence: Secondary | ICD-10-CM | POA: Diagnosis not present

## 2023-03-05 DIAGNOSIS — Z510 Encounter for antineoplastic radiation therapy: Secondary | ICD-10-CM | POA: Diagnosis not present

## 2023-03-05 DIAGNOSIS — E782 Mixed hyperlipidemia: Secondary | ICD-10-CM | POA: Diagnosis not present

## 2023-03-05 DIAGNOSIS — R590 Localized enlarged lymph nodes: Secondary | ICD-10-CM | POA: Diagnosis not present

## 2023-03-05 LAB — RAD ONC ARIA SESSION SUMMARY
Course Elapsed Days: 8
Plan Fractions Treated to Date: 7
Plan Prescribed Dose Per Fraction: 2 Gy
Plan Total Fractions Prescribed: 15
Plan Total Prescribed Dose: 30 Gy
Reference Point Dosage Given to Date: 14 Gy
Reference Point Session Dosage Given: 2 Gy
Session Number: 7

## 2023-03-05 MED ORDER — TIZANIDINE HCL 2 MG PO TABS
2.0000 mg | ORAL_TABLET | Freq: Three times a day (TID) | ORAL | 0 refills | Status: DC | PRN
Start: 2023-03-05 — End: 2023-04-23

## 2023-03-05 NOTE — Assessment & Plan Note (Addendum)
 Chronic LDL 6 months ago controlled Check lipid panel Continue crestor 20 mg daily Regular exercise and healthy diet encouraged

## 2023-03-05 NOTE — Assessment & Plan Note (Signed)
 New Has pain just medial to the right scapula that appears to be muscular in nature and likely a strain Has tried conservative measures without improvement Trial of tizanidine 2 every 4 mg every 8 hours as needed-discussed this may cause drowsiness

## 2023-03-05 NOTE — Assessment & Plan Note (Signed)
 Chronic BP at home controlled Continue avapro 75 mg daily

## 2023-03-05 NOTE — Assessment & Plan Note (Addendum)
 Chronic Lab Results  Component Value Date   HGBA1C 5.9 09/02/2022   Encouraged healthy diet and maintaining exercise Check A1c

## 2023-03-05 NOTE — Assessment & Plan Note (Signed)
 Chronic GERD controlled Continue omeprazole 40 mg daily

## 2023-03-06 ENCOUNTER — Other Ambulatory Visit: Payer: Self-pay

## 2023-03-06 ENCOUNTER — Encounter: Payer: Self-pay | Admitting: Internal Medicine

## 2023-03-06 ENCOUNTER — Ambulatory Visit
Admission: RE | Admit: 2023-03-06 | Discharge: 2023-03-06 | Disposition: A | Discharge status: Home / Self Care | Payer: Medicare Other | Source: Ambulatory Visit | Attending: Radiation Oncology | Admitting: Radiation Oncology

## 2023-03-06 DIAGNOSIS — R590 Localized enlarged lymph nodes: Secondary | ICD-10-CM | POA: Diagnosis not present

## 2023-03-06 DIAGNOSIS — C3411 Malignant neoplasm of upper lobe, right bronchus or lung: Secondary | ICD-10-CM | POA: Diagnosis not present

## 2023-03-06 DIAGNOSIS — C771 Secondary and unspecified malignant neoplasm of intrathoracic lymph nodes: Secondary | ICD-10-CM | POA: Diagnosis not present

## 2023-03-06 DIAGNOSIS — Z87891 Personal history of nicotine dependence: Secondary | ICD-10-CM | POA: Diagnosis not present

## 2023-03-06 DIAGNOSIS — Z510 Encounter for antineoplastic radiation therapy: Secondary | ICD-10-CM | POA: Diagnosis not present

## 2023-03-06 LAB — RAD ONC ARIA SESSION SUMMARY
Course Elapsed Days: 9
Plan Fractions Treated to Date: 8
Plan Prescribed Dose Per Fraction: 2 Gy
Plan Total Fractions Prescribed: 15
Plan Total Prescribed Dose: 30 Gy
Reference Point Dosage Given to Date: 16 Gy
Reference Point Session Dosage Given: 2 Gy
Session Number: 8

## 2023-03-06 LAB — LIPID PANEL
Cholesterol: 143 mg/dL (ref 0–200)
HDL: 38.9 mg/dL — ABNORMAL LOW (ref >39.00)
LDL Cholesterol: 81 mg/dL (ref 0–99)
NonHDL: 103.7
Total CHOL/HDL Ratio: 4
Triglycerides: 115 mg/dL (ref 0.0–149.0)
VLDL: 23 mg/dL (ref 0.0–40.0)

## 2023-03-06 LAB — HEMOGLOBIN A1C: Hgb A1c MFr Bld: 5.9 % (ref 4.6–6.5)

## 2023-03-07 ENCOUNTER — Ambulatory Visit
Admission: RE | Admit: 2023-03-07 | Discharge: 2023-03-07 | Disposition: A | Discharge status: Home / Self Care | Payer: Medicare Other | Source: Ambulatory Visit | Attending: Radiation Oncology | Admitting: Radiation Oncology

## 2023-03-07 ENCOUNTER — Other Ambulatory Visit: Payer: Self-pay

## 2023-03-07 DIAGNOSIS — R590 Localized enlarged lymph nodes: Secondary | ICD-10-CM | POA: Diagnosis not present

## 2023-03-07 DIAGNOSIS — C771 Secondary and unspecified malignant neoplasm of intrathoracic lymph nodes: Secondary | ICD-10-CM | POA: Diagnosis not present

## 2023-03-07 DIAGNOSIS — Z510 Encounter for antineoplastic radiation therapy: Secondary | ICD-10-CM | POA: Diagnosis not present

## 2023-03-07 DIAGNOSIS — C3411 Malignant neoplasm of upper lobe, right bronchus or lung: Secondary | ICD-10-CM | POA: Diagnosis not present

## 2023-03-07 DIAGNOSIS — Z87891 Personal history of nicotine dependence: Secondary | ICD-10-CM | POA: Diagnosis not present

## 2023-03-07 LAB — RAD ONC ARIA SESSION SUMMARY
Course Elapsed Days: 10
Plan Fractions Treated to Date: 9
Plan Prescribed Dose Per Fraction: 2 Gy
Plan Total Fractions Prescribed: 15
Plan Total Prescribed Dose: 30 Gy
Reference Point Dosage Given to Date: 18 Gy
Reference Point Session Dosage Given: 2 Gy
Session Number: 9

## 2023-03-10 ENCOUNTER — Other Ambulatory Visit: Payer: Self-pay

## 2023-03-10 ENCOUNTER — Ambulatory Visit
Admission: RE | Admit: 2023-03-10 | Discharge: 2023-03-10 | Disposition: A | Discharge status: Home / Self Care | Payer: Medicare Other | Source: Ambulatory Visit | Attending: Radiation Oncology | Admitting: Radiation Oncology

## 2023-03-10 DIAGNOSIS — Z510 Encounter for antineoplastic radiation therapy: Secondary | ICD-10-CM | POA: Insufficient documentation

## 2023-03-10 DIAGNOSIS — C3411 Malignant neoplasm of upper lobe, right bronchus or lung: Secondary | ICD-10-CM | POA: Diagnosis not present

## 2023-03-10 DIAGNOSIS — C771 Secondary and unspecified malignant neoplasm of intrathoracic lymph nodes: Secondary | ICD-10-CM | POA: Insufficient documentation

## 2023-03-10 DIAGNOSIS — R590 Localized enlarged lymph nodes: Secondary | ICD-10-CM | POA: Diagnosis not present

## 2023-03-10 DIAGNOSIS — Z87891 Personal history of nicotine dependence: Secondary | ICD-10-CM | POA: Diagnosis not present

## 2023-03-10 LAB — RAD ONC ARIA SESSION SUMMARY
Course Elapsed Days: 13
Plan Fractions Treated to Date: 10
Plan Prescribed Dose Per Fraction: 2 Gy
Plan Total Fractions Prescribed: 15
Plan Total Prescribed Dose: 30 Gy
Reference Point Dosage Given to Date: 20 Gy
Reference Point Session Dosage Given: 2 Gy
Session Number: 10

## 2023-03-11 ENCOUNTER — Ambulatory Visit
Admission: RE | Admit: 2023-03-11 | Discharge: 2023-03-11 | Disposition: A | Discharge status: Home / Self Care | Payer: Medicare Other | Source: Ambulatory Visit | Attending: Radiation Oncology | Admitting: Radiation Oncology

## 2023-03-11 ENCOUNTER — Other Ambulatory Visit: Payer: Self-pay

## 2023-03-11 ENCOUNTER — Ambulatory Visit
Admission: RE | Admit: 2023-03-11 | Discharge: 2023-03-11 | Discharge status: Home / Self Care | Payer: Medicare Other | Source: Ambulatory Visit | Attending: Radiation Oncology | Admitting: Radiation Oncology

## 2023-03-11 DIAGNOSIS — C3411 Malignant neoplasm of upper lobe, right bronchus or lung: Secondary | ICD-10-CM | POA: Diagnosis not present

## 2023-03-11 DIAGNOSIS — C771 Secondary and unspecified malignant neoplasm of intrathoracic lymph nodes: Secondary | ICD-10-CM | POA: Diagnosis not present

## 2023-03-11 DIAGNOSIS — R590 Localized enlarged lymph nodes: Secondary | ICD-10-CM | POA: Diagnosis not present

## 2023-03-11 DIAGNOSIS — Z87891 Personal history of nicotine dependence: Secondary | ICD-10-CM | POA: Diagnosis not present

## 2023-03-11 DIAGNOSIS — Z510 Encounter for antineoplastic radiation therapy: Secondary | ICD-10-CM | POA: Diagnosis not present

## 2023-03-11 LAB — RAD ONC ARIA SESSION SUMMARY
Course Elapsed Days: 14
Plan Fractions Treated to Date: 11
Plan Prescribed Dose Per Fraction: 2 Gy
Plan Total Fractions Prescribed: 15
Plan Total Prescribed Dose: 30 Gy
Reference Point Dosage Given to Date: 22 Gy
Reference Point Session Dosage Given: 2 Gy
Session Number: 11

## 2023-03-12 ENCOUNTER — Other Ambulatory Visit: Payer: Self-pay

## 2023-03-12 ENCOUNTER — Ambulatory Visit
Admission: RE | Admit: 2023-03-12 | Discharge: 2023-03-12 | Disposition: A | Discharge status: Home / Self Care | Payer: Medicare Other | Source: Ambulatory Visit | Attending: Radiation Oncology | Admitting: Radiation Oncology

## 2023-03-12 DIAGNOSIS — Z87891 Personal history of nicotine dependence: Secondary | ICD-10-CM | POA: Diagnosis not present

## 2023-03-12 DIAGNOSIS — C771 Secondary and unspecified malignant neoplasm of intrathoracic lymph nodes: Secondary | ICD-10-CM | POA: Diagnosis not present

## 2023-03-12 DIAGNOSIS — C3411 Malignant neoplasm of upper lobe, right bronchus or lung: Secondary | ICD-10-CM | POA: Diagnosis not present

## 2023-03-12 DIAGNOSIS — Z510 Encounter for antineoplastic radiation therapy: Secondary | ICD-10-CM | POA: Diagnosis not present

## 2023-03-12 DIAGNOSIS — R590 Localized enlarged lymph nodes: Secondary | ICD-10-CM | POA: Diagnosis not present

## 2023-03-12 LAB — RAD ONC ARIA SESSION SUMMARY
Course Elapsed Days: 15
Plan Fractions Treated to Date: 12
Plan Prescribed Dose Per Fraction: 2 Gy
Plan Total Fractions Prescribed: 15
Plan Total Prescribed Dose: 30 Gy
Reference Point Dosage Given to Date: 24 Gy
Reference Point Session Dosage Given: 2 Gy
Session Number: 12

## 2023-03-13 ENCOUNTER — Ambulatory Visit
Admission: RE | Admit: 2023-03-13 | Discharge: 2023-03-13 | Disposition: A | Discharge status: Home / Self Care | Payer: Medicare Other | Source: Ambulatory Visit | Attending: Radiation Oncology | Admitting: Radiation Oncology

## 2023-03-13 ENCOUNTER — Other Ambulatory Visit: Payer: Self-pay

## 2023-03-13 DIAGNOSIS — R590 Localized enlarged lymph nodes: Secondary | ICD-10-CM | POA: Diagnosis not present

## 2023-03-13 DIAGNOSIS — Z510 Encounter for antineoplastic radiation therapy: Secondary | ICD-10-CM | POA: Diagnosis not present

## 2023-03-13 DIAGNOSIS — C771 Secondary and unspecified malignant neoplasm of intrathoracic lymph nodes: Secondary | ICD-10-CM | POA: Diagnosis not present

## 2023-03-13 DIAGNOSIS — C3411 Malignant neoplasm of upper lobe, right bronchus or lung: Secondary | ICD-10-CM | POA: Diagnosis not present

## 2023-03-13 DIAGNOSIS — Z87891 Personal history of nicotine dependence: Secondary | ICD-10-CM | POA: Diagnosis not present

## 2023-03-13 LAB — RAD ONC ARIA SESSION SUMMARY
Course Elapsed Days: 16
Plan Fractions Treated to Date: 13
Plan Prescribed Dose Per Fraction: 2 Gy
Plan Total Fractions Prescribed: 15
Plan Total Prescribed Dose: 30 Gy
Reference Point Dosage Given to Date: 26 Gy
Reference Point Session Dosage Given: 2 Gy
Session Number: 13

## 2023-03-14 ENCOUNTER — Ambulatory Visit
Admission: RE | Admit: 2023-03-14 | Discharge: 2023-03-14 | Disposition: A | Discharge status: Home / Self Care | Payer: Medicare Other | Source: Ambulatory Visit | Attending: Radiation Oncology | Admitting: Radiation Oncology

## 2023-03-14 ENCOUNTER — Other Ambulatory Visit: Payer: Self-pay

## 2023-03-14 DIAGNOSIS — R590 Localized enlarged lymph nodes: Secondary | ICD-10-CM | POA: Diagnosis not present

## 2023-03-14 DIAGNOSIS — Z510 Encounter for antineoplastic radiation therapy: Secondary | ICD-10-CM | POA: Diagnosis not present

## 2023-03-14 DIAGNOSIS — Z87891 Personal history of nicotine dependence: Secondary | ICD-10-CM | POA: Diagnosis not present

## 2023-03-14 DIAGNOSIS — C3411 Malignant neoplasm of upper lobe, right bronchus or lung: Secondary | ICD-10-CM | POA: Diagnosis not present

## 2023-03-14 DIAGNOSIS — C771 Secondary and unspecified malignant neoplasm of intrathoracic lymph nodes: Secondary | ICD-10-CM | POA: Diagnosis not present

## 2023-03-14 LAB — RAD ONC ARIA SESSION SUMMARY
Course Elapsed Days: 17
Plan Fractions Treated to Date: 14
Plan Prescribed Dose Per Fraction: 2 Gy
Plan Total Fractions Prescribed: 15
Plan Total Prescribed Dose: 30 Gy
Reference Point Dosage Given to Date: 28 Gy
Reference Point Session Dosage Given: 2 Gy
Session Number: 14

## 2023-03-17 ENCOUNTER — Other Ambulatory Visit: Payer: Self-pay

## 2023-03-17 ENCOUNTER — Ambulatory Visit
Admission: RE | Admit: 2023-03-17 | Discharge: 2023-03-17 | Disposition: A | Discharge status: Home / Self Care | Payer: Medicare Other | Source: Ambulatory Visit | Attending: Radiation Oncology | Admitting: Radiation Oncology

## 2023-03-17 DIAGNOSIS — Z87891 Personal history of nicotine dependence: Secondary | ICD-10-CM | POA: Diagnosis not present

## 2023-03-17 DIAGNOSIS — R590 Localized enlarged lymph nodes: Secondary | ICD-10-CM | POA: Diagnosis not present

## 2023-03-17 DIAGNOSIS — C3411 Malignant neoplasm of upper lobe, right bronchus or lung: Secondary | ICD-10-CM | POA: Diagnosis not present

## 2023-03-17 DIAGNOSIS — C771 Secondary and unspecified malignant neoplasm of intrathoracic lymph nodes: Secondary | ICD-10-CM | POA: Diagnosis not present

## 2023-03-17 DIAGNOSIS — Z510 Encounter for antineoplastic radiation therapy: Secondary | ICD-10-CM | POA: Diagnosis not present

## 2023-03-17 LAB — RAD ONC ARIA SESSION SUMMARY
Course Elapsed Days: 20
Plan Fractions Treated to Date: 15
Plan Prescribed Dose Per Fraction: 2 Gy
Plan Total Fractions Prescribed: 15
Plan Total Prescribed Dose: 30 Gy
Reference Point Dosage Given to Date: 30 Gy
Reference Point Session Dosage Given: 2 Gy
Session Number: 15

## 2023-03-18 NOTE — Radiation Completion Notes (Addendum)
  Radiation Oncology         [phone] ________________________________  Name: Patrick Harris MRN: [id number]  Date of Service: 03/17/2023  DOB: 08-20-1945  End of Treatment Note     Diagnosis: C34.11 Malignant neoplasm of upper lobe, right bronchus or lung Intent: Curative     ==========DELIVERED PLANS==========  First Treatment Date: 2023-02-25 Last Treatment Date: 2023-03-17   Plan Name: [facility]CV_R Site: [facility]-RT Technique: IMRT Mode: Photon Dose Per Fraction: 2 Gy Prescribed Dose (Delivered / Prescribed): 30 Gy / 30 Gy Prescribed Fxs (Delivered / Prescribed): 15 / 15     ====================================   The patient tolerated radiation. He developed fatigue and continued to experience right arm pain and a burning sensation in the throat throughout his treatment.   The patient will return in one month and will continue follow up with Dr. Arbutus Ped as well.      Joyice Faster, PA-C

## 2023-03-24 ENCOUNTER — Other Ambulatory Visit: Payer: Self-pay

## 2023-03-24 DIAGNOSIS — K219 Gastro-esophageal reflux disease without esophagitis: Secondary | ICD-10-CM

## 2023-03-24 MED ORDER — OMEPRAZOLE 40 MG PO CPDR: 40.0000 mg | DELAYED_RELEASE_CAPSULE | Freq: Two times a day (BID) | ORAL | 1 refills | Status: DC

## 2023-03-25 DIAGNOSIS — L821 Other seborrheic keratosis: Secondary | ICD-10-CM | POA: Diagnosis not present

## 2023-03-25 DIAGNOSIS — L570 Actinic keratosis: Secondary | ICD-10-CM | POA: Diagnosis not present

## 2023-03-26 ENCOUNTER — Telehealth: Payer: Self-pay | Admitting: Pediatrics

## 2023-03-26 DIAGNOSIS — K219 Gastro-esophageal reflux disease without esophagitis: Secondary | ICD-10-CM

## 2023-03-26 MED ORDER — OMEPRAZOLE 40 MG PO CPDR
40.0000 mg | DELAYED_RELEASE_CAPSULE | Freq: Two times a day (BID) | ORAL | 1 refills | Status: DC
Start: 2023-03-26 — End: 2023-04-23

## 2023-03-26 NOTE — Telephone Encounter (Signed)
 Called and spoke to Tony with [facility] and did a verbal since the script failed

## 2023-03-26 NOTE — Telephone Encounter (Signed)
 Inbound call from patient states he has been doing a lot better since his last office visit.   Also requesting medication refill for omeprazole 40 mg. Please advise.   Thank you

## 2023-03-27 ENCOUNTER — Encounter: Payer: Self-pay | Admitting: Family Medicine

## 2023-03-27 ENCOUNTER — Ambulatory Visit (INDEPENDENT_AMBULATORY_CARE_PROVIDER_SITE_OTHER): Admitting: Family Medicine

## 2023-03-27 VITALS — BP 150/88 | HR 79 | Temp 98.4°F | Ht 71.0 in | Wt 193.4 lb

## 2023-03-27 DIAGNOSIS — B0222 Postherpetic trigeminal neuralgia: Secondary | ICD-10-CM | POA: Diagnosis not present

## 2023-03-27 DIAGNOSIS — R21 Rash and other nonspecific skin eruption: Secondary | ICD-10-CM | POA: Diagnosis not present

## 2023-03-27 MED ORDER — METHYLPREDNISOLONE ACETATE 40 MG/ML IJ SUSP
40.0000 mg | Freq: Once | INTRAMUSCULAR | Status: AC
  Administered 2023-03-27: 40 mg via INTRAMUSCULAR

## 2023-03-27 MED ORDER — VALACYCLOVIR HCL 1 G PO TABS: 1000.0000 mg | ORAL_TABLET | Freq: Three times a day (TID) | ORAL | 0 refills | Status: AC

## 2023-03-27 MED ORDER — TRIAMCINOLONE ACETONIDE 0.1 % EX CREA: 1.0000 | TOPICAL_CREAM | Freq: Two times a day (BID) | CUTANEOUS | 0 refills | Status: DC

## 2023-03-27 MED ORDER — GABAPENTIN 300 MG PO CAPS: 300.0000 mg | ORAL_CAPSULE | Freq: Three times a day (TID) | ORAL | 3 refills | Status: DC

## 2023-03-27 NOTE — Progress Notes (Signed)
 Acute Office Visit  Subjective:     Patient ID: Patrick Harris, male    DOB: 04-Dec-1945, 78 y.o.   MRN: [id number]  Chief Complaint  Patient presents with   Acute Visit    Left sided pains, started Monday, noticed rash on Wednesday. Rash is very painful    HPI Patient is in today for evaluation of rash to the left back and left chest, fatigue, back and chest wall pain for the last 3 days. He is accompanied by his wife today. Patient states that pain began on Monday and describes as burning and stinging.  States this kept him up last night. Has not attempted OTC treatment. States that the rash just appeared yesterday into last night. Denies discharge or bleeding.  Reports 1 episode of similar symptoms in the past and was treated for shingles. Has completed shingles vaccines.   ROS Per HPI      Objective:    BP (!) 150/88 (BP Location: Left Arm, Patient Position: Sitting)   Pulse 79   Temp 98.4 F (36.9 C) (Temporal)   Ht 5\' 11"  (1.803 m)   Wt 193 lb 6.4 oz (87.7 kg)   SpO2 95%   BMI 26.97 kg/m    Physical Exam Vitals and nursing note reviewed.  Constitutional:      General: He is not in acute distress.    Appearance: Normal appearance.  HENT:     Head: Normocephalic and atraumatic.  Eyes:     Extraocular Movements: Extraocular movements intact.  Cardiovascular:     Rate and Rhythm: Normal rate and regular rhythm.  Pulmonary:     Effort: Pulmonary effort is normal.  Musculoskeletal:        General: Normal range of motion.     Cervical back: Normal range of motion.  Skin:    General: Skin is warm and dry.     Findings: Rash present.          Comments: Areas of erythematous, vesicular rash.  No discharge, no bleeding noted  Neurological:     General: No focal deficit present.     Mental Status: He is alert and oriented to person, place, and time.  Psychiatric:        Mood and Affect: Mood normal.        Behavior: Behavior normal.     No results  found for any visits on 03/27/23.      Assessment & Plan:   HZV (herpes zoster virus) post herpetic trigeminal neuralgia -     valACYclovir HCl; Take 1 tablet (1,000 mg total) by mouth 3 (three) times daily for 7 days.  Dispense: 21 tablet; Refill: 0 -     Triamcinolone Acetonide; Apply 1 Application topically 2 (two) times daily.  Dispense: 30 g; Refill: 0 -     Gabapentin; Take 1 capsule (300 mg total) by mouth 3 (three) times daily.  Dispense: 60 capsule; Refill: 3 -     methylPREDNISolone Acetate  Rash -     valACYclovir HCl; Take 1 tablet (1,000 mg total) by mouth 3 (three) times daily for 7 days.  Dispense: 21 tablet; Refill: 0 -     Triamcinolone Acetonide; Apply 1 Application topically 2 (two) times daily.  Dispense: 30 g; Refill: 0 -     Gabapentin; Take 1 capsule (300 mg total) by mouth 3 (three) times daily.  Dispense: 60 capsule; Refill: 3 -     methylPREDNISolone Acetate     Meds ordered  this encounter  Medications   valACYclovir (VALTREX) 1000 MG tablet    Sig: Take 1 tablet (1,000 mg total) by mouth 3 (three) times daily for 7 days.    Dispense:  21 tablet    Refill:  0   triamcinolone cream (KENALOG) 0.1 %    Sig: Apply 1 Application topically 2 (two) times daily.    Dispense:  30 g    Refill:  0   gabapentin (NEURONTIN) 300 MG capsule    Sig: Take 1 capsule (300 mg total) by mouth 3 (three) times daily.    Dispense:  60 capsule    Refill:  3   methylPREDNISolone acetate (DEPO-MEDROL) injection 40 mg    Return if symptoms worsen or fail to improve.  Moshe Cipro, FNP

## 2023-03-27 NOTE — Patient Instructions (Addendum)
 You have received a steroid injection in the office today.  I have sent in triamcinolone cream for you.  You may use this twice a day.  I have sent in Valtrex for you to take 1 tablet 3 times a day for 7 days.  I have also sent in gabapentin for you to use as needed for pain.   Follow-up with me for new or worsening symptoms.

## 2023-04-01 ENCOUNTER — Ambulatory Visit (INDEPENDENT_AMBULATORY_CARE_PROVIDER_SITE_OTHER): Payer: Medicare Other

## 2023-04-01 ENCOUNTER — Inpatient Hospital Stay: Payer: Medicare Other | Attending: Internal Medicine

## 2023-04-01 ENCOUNTER — Encounter: Payer: Self-pay | Admitting: Internal Medicine

## 2023-04-01 VITALS — BP 148/70 | HR 82 | Ht 71.0 in | Wt 193.8 lb

## 2023-04-01 DIAGNOSIS — Z0000 Encounter for general adult medical examination without abnormal findings: Secondary | ICD-10-CM

## 2023-04-01 DIAGNOSIS — Z95828 Presence of other vascular implants and grafts: Secondary | ICD-10-CM

## 2023-04-01 DIAGNOSIS — C3412 Malignant neoplasm of upper lobe, left bronchus or lung: Secondary | ICD-10-CM | POA: Diagnosis not present

## 2023-04-01 DIAGNOSIS — C3411 Malignant neoplasm of upper lobe, right bronchus or lung: Secondary | ICD-10-CM | POA: Diagnosis not present

## 2023-04-01 MED ORDER — HEPARIN SOD (PORK) LOCK FLUSH 100 UNIT/ML IV SOLN
250.0000 [IU] | Freq: Once | INTRAVENOUS | Status: AC
  Administered 2023-04-01: 250 [IU]

## 2023-04-01 MED ORDER — SODIUM CHLORIDE 0.9% FLUSH
10.0000 mL | Freq: Once | INTRAVENOUS | Status: AC
  Administered 2023-04-01: 10 mL

## 2023-04-01 NOTE — Patient Instructions (Addendum)
 Mr. Chiasson , Thank you for taking time to come for your Medicare Wellness Visit. I appreciate your ongoing commitment to your health goals. Please review the following plan we discussed and let me know if I can assist you in the future.   Referrals/Orders/Follow-Ups/Clinician Recommendations: Aim for 30 minutes of exercise or brisk walking, 6-8 glasses of water, and 5 servings of fruits and vegetables each day.   This is a list of the screening recommended for you and due dates:  Health Maintenance  Topic Date Due   COVID-19 Vaccine (4 - 2024-25 season) 09/08/2022   Flu Shot  04/07/2023*   Zoster (Shingles) Vaccine (1 of 2) 06/02/2023*   Medicare Annual Wellness Visit  03/31/2024   Colon Cancer Screening  12/17/2025   DTaP/Tdap/Td vaccine (4 - Td or Tdap) 05/05/2031   Pneumonia Vaccine  Completed   Hepatitis C Screening  Completed   HPV Vaccine  Aged Out  *Topic was postponed. The date shown is not the original due date.    Advanced directives: (Provided) Advance directive discussed with you today. I have provided a copy for you to complete at home and have notarized. Once this is complete, please bring a copy in to our office so we can scan it into your chart.   Next Medicare Annual Wellness Visit scheduled for next year: Yes

## 2023-04-01 NOTE — Progress Notes (Signed)
 Subjective:   Patrick Harris is a 78 y.o. who presents for a Medicare Wellness preventive visit.  Visit Complete: In person  Persons Participating in Visit: Patient.  AWV Questionnaire: No: Patient Medicare AWV questionnaire was not completed prior to this visit.  Cardiac Risk Factors include: advanced age (>12men, >21 women);hypertension;dyslipidemia;male gender     Objective:    Today's Vitals   04/01/23 1057 04/01/23 1111  BP: (!) 152/70 (!) 148/70  Pulse: 82   SpO2: 95%   Weight: 193 lb 12.8 oz (87.9 kg)   Height: 5\' 11"  (1.803 m)    Body mass index is 27.03 kg/m.     04/01/2023   10:55 AM 02/13/2023    2:35 PM 10/15/2022    3:30 PM 07/24/2022    2:27 PM 06/26/2022   11:30 AM 06/21/2022    7:45 PM 06/02/2022    8:38 PM  Advanced Directives  Does Patient Have a Medical Advance Directive? No No Yes Yes Yes Yes Yes  Type of Furniture conservator/restorer;Living will  Healthcare Power of [place];Living will  Living will;Out of facility DNR (pink MOST or yellow form) Healthcare Power of [place];Living will;Out of facility DNR (pink MOST or yellow form)  Does patient want to make changes to medical advance directive?   No - Patient declined  No - Patient declined    Would patient like information on creating a medical advance directive? Yes (MAU/Ambulatory/Procedural Areas - Information given)     No - Patient declined     Current Medications (verified) Outpatient Encounter Medications as of 04/01/2023  Medication Sig   amitriptyline (ELAVIL) 10 MG tablet Take 1 tablet (10 mg total) by mouth at bedtime.   aspirin EC 81 MG tablet Take 1 tablet (81 mg total) by mouth at bedtime. Okay to restart this medication on 07/03/2021   Cholecalciferol (VITAMIN D3) 2000 UNITS TABS Take 2,000 Units by mouth in the morning and at bedtime.   Coenzyme Q10 (COQ-10) 100 MG CAPS Take 100 mg by mouth in the morning.   finasteride (PROSCAR) 5 MG tablet Take 5 mg by mouth  daily.   gabapentin (NEURONTIN) 300 MG capsule Take 1 capsule (300 mg total) by mouth 3 (three) times daily.   irbesartan (AVAPRO) 75 MG tablet TAKE 1 TABLET BY MOUTH EVERY DAY   nitroGLYCERIN (NITROSTAT) 0.4 MG SL tablet Place 1 tablet (0.4 mg total) under the tongue every 5 (five) minutes x 3 doses as needed for chest pain (if no relief after 2nd dose, proceed to ED or call 911).   Omega-3 Fatty Acids (OMEGA-3 FISH OIL PO) Take 1,000 mg by mouth in the morning.   omeprazole (PRILOSEC) 40 MG capsule Take 1 capsule (40 mg total) by mouth 2 (two) times daily.   polyethylene glycol (MIRALAX / GLYCOLAX) 17 g packet Take 17 g by mouth as needed.   rosuvastatin (CRESTOR) 20 MG tablet TAKE HALF A TABLET (10 MG) BY MOUTH ONCE DAILY.   sucralfate (CARAFATE) 1 g tablet Take 1 tablet (1 g total) by mouth 4 (four) times daily -  with meals and at bedtime. Crush and dissolve in 10 mL warm water prior to swallowing, take 20 min before meals   tamsulosin (FLOMAX) 0.4 MG CAPS capsule Take 0.4 mg by mouth in the morning.   Testosterone 20.25 MG/ACT (1.62%) GEL Apply 2 Pump topically in the morning.   tiZANidine (ZANAFLEX) 2 MG tablet Take 1-2 tablets (2-4 mg total) by mouth every  8 (eight) hours as needed for muscle spasms.   triamcinolone cream (KENALOG) 0.1 % Apply 1 Application topically 2 (two) times daily.   valACYclovir (VALTREX) 1000 MG tablet Take 1 tablet (1,000 mg total) by mouth 3 (three) times daily for 7 days.   vitamin C (ASCORBIC ACID) 500 MG tablet Take 500 mg by mouth in the morning.   Vitamin E (VITAMIN E/D-ALPHA NATURAL) 268 MG (400 UNIT) CAPS Take 1 tablet by mouth daily.   No facility-administered encounter medications on file as of 04/01/2023.    Allergies (verified) Latex, Bromocriptine, Zestril [lisinopril], Diovan [valsartan], Hydrochlorothiazide, Norvasc [amlodipine], and Zocor [simvastatin]   History: Past Medical History:  Diagnosis Date   Arthritis    CAD (coronary artery  disease)    CABG with LIMA to LAD and RIMA to RCA August 2000 - Dr. Dorris Fetch   Cancer [facility])    basal cell   CHF (congestive heart failure) (HCC)    COPD (chronic obstructive pulmonary disease) (HCC)    Elevated glycated hemoglobin    Essential hypertension    GERD (gastroesophageal reflux disease)    Heart murmur    History of radiation therapy    Right lung(chest) 03/19/22-05/06/22- Dr. Antony Blackbird   Hyperlipidemia    Hypopituitarism [facility])    Lung cancer (HCC)    Osteopenia    Dr Juleen China   Pancreatitis    Peyronie disease    Testosterone deficiency    Dr Juleen China   Past Surgical History:  Procedure Laterality Date   Bone spur  2006   5th toe bilaterally   BRONCHIAL BIOPSY  07/02/2021   Procedure: BRONCHIAL BIOPSIES;  Surgeon: Leslye Peer, MD;  Location: [facility] ENDOSCOPY;  Service: Pulmonary;;   BRONCHIAL BRUSHINGS  07/02/2021   Procedure: BRONCHIAL BRUSHINGS;  Surgeon: Leslye Peer, MD;  Location: [facility] ENDOSCOPY;  Service: Pulmonary;;   BRONCHIAL NEEDLE ASPIRATION BIOPSY  07/02/2021   Procedure: BRONCHIAL NEEDLE ASPIRATION BIOPSIES;  Surgeon: Leslye Peer, MD;  Location: [facility] ENDOSCOPY;  Service: Pulmonary;;   COLONOSCOPY  2007   negative; Dr Jarold Motto   CORONARY ARTERY BYPASS GRAFT  2001   2 vessels   FIDUCIAL MARKER PLACEMENT  07/02/2021   Procedure: FIDUCIAL MARKER PLACEMENT;  Surgeon: Leslye Peer, MD;  Location: [facility] ENDOSCOPY;  Service: Pulmonary;;   fracture heel  1967   Bil/ due to fall off ladder   Fractured calcaneus  1969   bilaterally w/ ankle fusion   HERNIA REPAIR  1984   right inguinal hernia   KNEE SURGERY Left    LOBECTOMY Right 11/13/2021   MENISECTOMY  2007   R medial, Dr. Charmayne Sheer CATH INSERTION     SHOULDER SURGERY  1994   left   UPPER GASTROINTESTINAL ENDOSCOPY  2007   GERD, Dr Jarold Motto   VASECTOMY     VIDEO BRONCHOSCOPY WITH RADIAL ENDOBRONCHIAL ULTRASOUND  07/02/2021   Procedure: VIDEO BRONCHOSCOPY WITH RADIAL ENDOBRONCHIAL  ULTRASOUND;  Surgeon: Leslye Peer, MD;  Location: [facility] ENDOSCOPY;  Service: Pulmonary;;   Family History  Problem Relation Age of Onset   Diabetes Mother    Stroke Mother 67   Hypertension Mother    Heart attack Father 108   Leukemia Sister    Parkinson's disease Sister    Stroke Sister    Stroke Sister    Stroke Sister    Atrial fibrillation Sister    Prostate cancer Brother    Aortic aneurysm Brother  abdominal   Heart disease Brother    Atrial fibrillation Brother    Cancer Maternal Uncle         X3:prostate , renal, bone    Colon cancer Paternal Uncle    Breast cancer Daughter    Esophageal cancer Neg Hx    Rectal cancer Neg Hx    Stomach cancer Neg Hx    Social History   Socioeconomic History   Marital status: Married    Spouse name: Not on file   Number of children: 1   Years of education: Not on file   Highest education level: 12th grade  Occupational History   Occupation: retired  Tobacco Use   Smoking status: Former    Current packs/day: 0.00    Types: Cigarettes    Quit date: 05/2021    Years since quitting: 1.9    Passive exposure: Past   Smokeless tobacco: Never  Vaping Use   Vaping status: Never Used  Substance and Sexual Activity   Alcohol use: No   Drug use: No   Sexual activity: Yes  Other Topics Concern   Not on file  Social History Narrative   Patients daughter passed away at 27 from Breast Cancer.       Right Handed    Lives in two story home    Social Drivers of Health   Financial Resource Strain: Low Risk  (04/01/2023)   Overall Financial Resource Strain (CARDIA)    Difficulty of Paying Living Expenses: Not hard at all  Food Insecurity: No Food Insecurity (04/01/2023)   Hunger Vital Sign    Worried About Running Out of Food in the Last Year: Never true    Ran Out of Food in the Last Year: Never true  Transportation Needs: No Transportation Needs (04/01/2023)   PRAPARE - Administrator, Civil Service (Medical):  No    Lack of Transportation (Non-Medical): No  Physical Activity: Insufficiently Active (04/01/2023)   Exercise Vital Sign    Days of Exercise per Week: 3 days    Minutes of Exercise per Session: 30 min  Stress: No Stress Concern Present (04/01/2023)   [employer] of Occupational Health - Occupational Stress Questionnaire    Feeling of Stress : Not at all  Social Connections: Socially Integrated (04/01/2023)   Social Connection and Isolation Panel [NHANES]    Frequency of Communication with Friends and Family: More than three times a week    Frequency of Social Gatherings with Friends and Family: More than three times a week    Attends Religious Services: More than 4 times per year    Active Member of [employer] or Organizations: Yes    Attends Banker Meetings: 1 to 4 times per year    Marital Status: Married    Tobacco Counseling Counseling given: No    Clinical Intake:  Pre-visit preparation completed: Yes  Pain : No/denies pain     BMI - recorded: 27.03 Nutritional Status: BMI 25 -29 Overweight Nutritional Risks: None Diabetes: No  Lab Results  Component Value Date   HGBA1C 5.9 03/06/2023   HGBA1C 5.9 09/02/2022   HGBA1C 6.0 08/29/2020     How often do you need to have someone help you when you read instructions, pamphlets, or other written materials from your doctor or pharmacy?: 1 - Never  Interpreter Needed?: No  Information entered by :: Hassell Halim, CMA   Activities of Daily Living     04/01/2023   10:59 AM  In your present state of health, do you have any difficulty performing the following activities:  Hearing? 0  Vision? 0  Difficulty concentrating or making decisions? 0  Walking or climbing stairs? 0  Dressing or bathing? 0  Doing errands, shopping? 0  Preparing Food and eating ? N  Using the Toilet? N  In the past six months, have you accidently leaked urine? N  Do you have problems with loss of bowel control? N  Managing  your Medications? N  Managing your Finances? N  Housekeeping or managing your Housekeeping? N    Patient Care Team: [name], MD as PCP - General (Internal Medicine) Jonelle Sidle, MD as PCP - Cardiology (Cardiology) Meryl Dare, MD (Inactive) as Consulting Physician (Gastroenterology) Darci Needle, MD as Consulting Physician (Endocrinology) Kathyrn Sheriff, [facility] (Inactive) as Pharmacist (Pharmacist) Maris Berger, MD as Consulting Physician (Ophthalmology) Glendale Chard, DO as Consulting Physician (Neurology)  Indicate any recent Medical Services you may have received from other than [facility] providers in the past year (date may be approximate).     Assessment:   This is a routine wellness examination for [place].  Hearing/Vision screen Hearing Screening - Comments:: Denies hearing difficulties   Vision Screening - Comments:: No difficulties with vision   Goals Addressed               This Visit's Progress     Patient Stated (pt-stated)        Patient stated he wants to stay well since finishing chemo/radiation therapy.       Depression Screen     04/01/2023   11:01 AM 09/02/2022    8:43 AM 05/02/2022   11:44 AM 12/27/2021    1:11 PM 09/24/2021    8:21 AM 09/05/2021    1:09 PM 08/29/2021    8:28 AM  PHQ 2/9 Scores  PHQ - 2 Score 0 0 0 0 0 0 0  PHQ- 9 Score 0 0 2 0 0 0 0    Fall Risk     04/01/2023   11:03 AM 09/02/2022    8:43 AM 09/02/2022    8:42 AM 01/10/2022    8:31 AM 12/27/2021    1:10 PM  Fall Risk   Falls in the past year? 0  0 0 0  Number falls in past yr: 0  0 0   Injury with Fall? 0  0 0   Risk for fall due to : No Fall Risks No Fall Risks No Fall Risks No Fall Risks --  Risk for fall due to: Comment     Patient reports losing his balance occasionally.  Follow up Falls prevention discussed;Falls evaluation completed Falls evaluation completed Falls evaluation completed Falls evaluation completed Falls evaluation completed     MEDICARE RISK AT HOME:  Medicare Risk at Home Any stairs in or around the home?: Yes If so, are there any without handrails?: No Home free of loose throw rugs in walkways, pet beds, electrical cords, etc?: Yes Adequate lighting in your home to reduce risk of falls?: Yes Life alert?: No Use of a cane, walker or w/c?: No Grab bars in the bathroom?: Yes Shower chair or bench in shower?: Yes Elevated toilet seat or a handicapped toilet?: No  TIMED UP AND GO:  Was the test performed?  No  Cognitive Function: 6CIT completed        04/01/2023   11:03 AM 09/24/2021    8:23 AM  6CIT Screen  What  Year? 0 points 0 points  What month? 0 points 0 points  What time? 0 points 0 points  Count back from 20 0 points 0 points  Months in reverse 0 points 0 points  Repeat phrase 2 points 0 points  Total Score 2 points 0 points    Immunizations Immunization History  Administered Date(s) Administered   Fluad Quad(high Dose 65+) 10/12/2020   Influenza Split 10/25/2004, 10/30/2005, 10/08/2018   Influenza Whole 10/07/2007, 10/17/2008   Influenza, High Dose Seasonal PF 10/16/2012, 10/15/2013, 10/20/2017, 10/30/2017, 10/19/2018, 11/03/2019, 10/15/2021   Influenza,inj,Quad PF,6+ Mos 10/16/2021   Influenza-Unspecified 12/25/1999, 02/11/2003, 10/25/2004, 10/30/2005, 10/25/2015, 10/14/2016   Moderna Sars-Covid-2 Vaccination 01/26/2019, 02/23/2019, 09/16/2019   Pneumococcal Conjugate-13 08/19/2014   Pneumococcal Polysaccharide-23 08/09/2010   Td 01/07/2001   Tdap 09/30/2011, 05/04/2021    Screening Tests Health Maintenance  Topic Date Due   COVID-19 Vaccine (4 - 2024-25 season) 09/08/2022   INFLUENZA VACCINE  04/07/2023 (Originally 08/08/2022)   Zoster Vaccines- Shingrix (1 of 2) 06/02/2023 (Originally 07/15/1964)   Medicare Annual Wellness (AWV)  03/31/2024   Colonoscopy  12/17/2025   DTaP/Tdap/Td (4 - Td or Tdap) 05/05/2031   Pneumonia Vaccine 87+ Years old  Completed   Hepatitis C  Screening  Completed   HPV VACCINES  Aged Out    Health Maintenance  Health Maintenance Due  Topic Date Due   COVID-19 Vaccine (4 - 2024-25 season) 09/08/2022   Health Maintenance Items Addressed:04/01/2023   Additional Screening:  Vision Screening: Recommended annual ophthalmology exams for early detection of glaucoma and other disorders of the eye.  Dental Screening: Recommended annual dental exams for proper oral hygiene  Community Resource Referral / Chronic Care Management: CRR required this visit?  No   CCM required this visit?  No     Plan:     I have personally reviewed and noted the following in the patient's chart:   Medical and social history Use of alcohol, tobacco or illicit drugs  Current medications and supplements including opioid prescriptions. Patient is not currently taking opioid prescriptions. Functional ability and status Nutritional status Physical activity Advanced directives List of other physicians Hospitalizations, surgeries, and ER visits in previous 12 months Vitals Screenings to include cognitive, depression, and falls Referrals and appointments  In addition, I have reviewed and discussed with patient certain preventive protocols, quality metrics, and best practice recommendations. A written personalized care plan for preventive services as well as general preventive health recommendations were provided to patient.     Darreld Mclean, CMA   04/01/2023   After Visit Summary: (MyChart) Due to this being a telephonic visit, the after visit summary with patients personalized plan was offered to patient via MyChart   Notes: Nothing significant to report at this time.

## 2023-04-14 ENCOUNTER — Encounter: Payer: Self-pay | Admitting: Radiation Oncology

## 2023-04-16 NOTE — Progress Notes (Signed)
 Radiation Oncology         [phone] ________________________________  Name: Patrick Harris MRN: [id number]  Date: 04/17/2023  DOB: Jun 19, 1945  Follow-Up Visit Note  CC: [name], MD  [name], MD  No diagnosis found.  Diagnosis:  C34.11 Malignant neoplasm of upper lobe, right bronchus or lung   Interval Since Last Radiation:  1 month 1 day  Intent: Curative  First Treatment Date: 2023-02-25 Last Treatment Date: 2023-03-17   Plan Name: SCV_R Site: [facility]-RT Technique: IMRT Mode: Photon Dose Per Fraction: 2 Gy Prescribed Dose (Delivered / Prescribed): 30 Gy / 30 Gy Prescribed Fxs (Delivered / Prescribed): 15 / 15  Narrative:  The patient returns today for routine follow-up. He was last seen in office on 02/13/23 . Since then, patient completed his radiation treatment which he  tolerated quite well. Patient did however endorse experiencing fatigue and right arm pain along with a burning sensation in the throat throughout his treatment.   Patient continued to follow up with their specialists to manage their chronic conditions.   Patient presented for a follow up with Dr. Doy Hutching on 2/19 with complains of dysphagia and odynophagia. He was advised to continue  omeprazole 40 mg p.o. twice daily and Carafate   No other significant oncologic interval history since the patient was last seen.                              Allergies:  is allergic to latex, bromocriptine, zestril [lisinopril], diovan [valsartan], hydrochlorothiazide, norvasc [amlodipine], and zocor [simvastatin].  Meds: Current Outpatient Medications  Medication Sig Dispense Refill   amitriptyline (ELAVIL) 10 MG tablet Take 1 tablet (10 mg total) by mouth at bedtime. 90 tablet 1   aspirin EC 81 MG tablet Take 1 tablet (81 mg total) by mouth at bedtime. Okay to restart this medication on 07/03/2021 30 tablet 11   Cholecalciferol (VITAMIN D3) 2000 UNITS TABS Take 2,000 Units by mouth in the morning and at  bedtime.     Coenzyme Q10 (COQ-10) 100 MG CAPS Take 100 mg by mouth in the morning.     finasteride (PROSCAR) 5 MG tablet Take 5 mg by mouth daily.     gabapentin (NEURONTIN) 300 MG capsule Take 1 capsule (300 mg total) by mouth 3 (three) times daily. 60 capsule 3   irbesartan (AVAPRO) 75 MG tablet TAKE 1 TABLET BY MOUTH EVERY DAY 90 tablet 1   nitroGLYCERIN (NITROSTAT) 0.4 MG SL tablet Place 1 tablet (0.4 mg total) under the tongue every 5 (five) minutes x 3 doses as needed for chest pain (if no relief after 2nd dose, proceed to ED or call 911). 25 tablet 3   Omega-3 Fatty Acids (OMEGA-3 FISH OIL PO) Take 1,000 mg by mouth in the morning.     omeprazole (PRILOSEC) 40 MG capsule Take 1 capsule (40 mg total) by mouth 2 (two) times daily. 60 capsule 1   polyethylene glycol (MIRALAX / GLYCOLAX) 17 g packet Take 17 g by mouth as needed.     rosuvastatin (CRESTOR) 20 MG tablet TAKE HALF A TABLET (10 MG) BY MOUTH ONCE DAILY. 45 tablet 3   sucralfate (CARAFATE) 1 g tablet Take 1 tablet (1 g total) by mouth 4 (four) times daily -  with meals and at bedtime. Crush and dissolve in 10 mL warm water prior to swallowing, take 20 min before meals 120 tablet 2   tamsulosin (FLOMAX)  0.4 MG CAPS capsule Take 0.4 mg by mouth in the morning.     Testosterone 20.25 MG/ACT (1.62%) GEL Apply 2 Pump topically in the morning.     tiZANidine (ZANAFLEX) 2 MG tablet Take 1-2 tablets (2-4 mg total) by mouth every 8 (eight) hours as needed for muscle spasms. 30 tablet 0   triamcinolone cream (KENALOG) 0.1 % Apply 1 Application topically 2 (two) times daily. 30 g 0   vitamin C (ASCORBIC ACID) 500 MG tablet Take 500 mg by mouth in the morning.     Vitamin E (VITAMIN E/D-ALPHA NATURAL) 268 MG (400 UNIT) CAPS Take 1 tablet by mouth daily.     No current facility-administered medications for this encounter.    Physical Findings: The patient is in no acute distress. Patient is alert and oriented.  vitals were not taken for this  visit. .  No significant changes. Lungs are clear to auscultation bilaterally. Heart has regular rate and rhythm. No palpable cervical, supraclavicular, or axillary adenopathy. Abdomen soft, non-tender, normal bowel sounds.   Lab Findings: Lab Results  Component Value Date   WBC 3.7 (L) 02/06/2023   HGB 14.2 02/06/2023   HCT 41.7 02/06/2023   MCV 95.0 02/06/2023   PLT 150 02/06/2023    Radiographic Findings: No results found.  Impression: C34.11 Malignant neoplasm of upper lobe, right bronchus or lung   The patient is recovering from the effects of radiation.  ***  Plan:  ***   *** minutes of total time was spent for this patient encounter, including preparation, face-to-face counseling with the patient and coordination of care, physical exam, and documentation of the encounter. ____________________________________  Billie Lade, PhD, MD  This document serves as a record of services personally performed by Antony Blackbird, MD. It was created on his behalf by Herbie Saxon, a trained medical scribe. The creation of this record is based on the scribe's personal observations and the provider's statements to them. This document has been checked and approved by the attending provider.

## 2023-04-17 ENCOUNTER — Ambulatory Visit
Admission: RE | Admit: 2023-04-17 | Discharge: 2023-04-17 | Disposition: A | Discharge status: Home / Self Care | Payer: Medicare Other | Source: Ambulatory Visit | Attending: Radiation Oncology | Admitting: Radiation Oncology

## 2023-04-17 ENCOUNTER — Encounter: Payer: Self-pay | Admitting: Radiation Oncology

## 2023-04-17 VITALS — BP 149/77 | HR 72 | Temp 97.5°F | Resp 20 | Ht 71.0 in | Wt 194.0 lb

## 2023-04-17 DIAGNOSIS — B029 Zoster without complications: Secondary | ICD-10-CM | POA: Diagnosis not present

## 2023-04-17 DIAGNOSIS — R21 Rash and other nonspecific skin eruption: Secondary | ICD-10-CM

## 2023-04-17 DIAGNOSIS — R1310 Dysphagia, unspecified: Secondary | ICD-10-CM | POA: Diagnosis not present

## 2023-04-17 DIAGNOSIS — C3411 Malignant neoplasm of upper lobe, right bronchus or lung: Secondary | ICD-10-CM | POA: Diagnosis not present

## 2023-04-17 DIAGNOSIS — Z923 Personal history of irradiation: Secondary | ICD-10-CM | POA: Insufficient documentation

## 2023-04-17 DIAGNOSIS — B0222 Postherpetic trigeminal neuralgia: Secondary | ICD-10-CM

## 2023-04-17 DIAGNOSIS — Z79899 Other long term (current) drug therapy: Secondary | ICD-10-CM | POA: Insufficient documentation

## 2023-04-17 DIAGNOSIS — C3491 Malignant neoplasm of unspecified part of right bronchus or lung: Secondary | ICD-10-CM

## 2023-04-17 DIAGNOSIS — C770 Secondary and unspecified malignant neoplasm of lymph nodes of head, face and neck: Secondary | ICD-10-CM | POA: Diagnosis not present

## 2023-04-17 DIAGNOSIS — R918 Other nonspecific abnormal finding of lung field: Secondary | ICD-10-CM

## 2023-04-17 HISTORY — DX: Zoster without complications: B02.9

## 2023-04-17 MED ORDER — TRIAMCINOLONE ACETONIDE 0.1 % EX CREA: 1.0000 | TOPICAL_CREAM | Freq: Two times a day (BID) | CUTANEOUS | 0 refills | Status: DC

## 2023-04-17 NOTE — Progress Notes (Signed)
 Patrick Harris is here today for follow up post radiation to the lung.  Lung Side: Right,patient completed treatment on 03/17/23.  Does the patient complain of any of the following: Pain: Yes, patient reports pain to left chest due to shingles. Patient reports completing Valtrex 2 weeks ago.  Shortness of breath w/wo exertion: No Cough: Yes, productive, thick greenish, brown sputum Hemoptysis: Yes Pain with swallowing: No, swallowing has improved with over the counter Throat calm spray.  Swallowing/choking concerns: No Appetite: Good Weight: Wt Readings from Last 3 Encounters:  04/17/23 194 lb (88 kg)  04/01/23 193 lb 12.8 oz (87.9 kg)  03/27/23 193 lb 6.4 oz (87.7 kg)    Energy Level: Low Post radiation skin Changes: No skin issues related to radiation. Patient does have dried shingles to chest and back.    Additional comments if applicable:  BP (!) 149/77   Pulse 72   Temp (!) 97.5 F (36.4 C)   Resp 20   Ht 5\' 11"  (1.803 m)   Wt 194 lb (88 kg)   SpO2 97%   BMI 27.06 kg/m

## 2023-04-20 ENCOUNTER — Other Ambulatory Visit: Payer: Self-pay | Admitting: Internal Medicine

## 2023-04-21 DIAGNOSIS — M79671 Pain in right foot: Secondary | ICD-10-CM | POA: Diagnosis not present

## 2023-04-21 DIAGNOSIS — L609 Nail disorder, unspecified: Secondary | ICD-10-CM | POA: Diagnosis not present

## 2023-04-21 DIAGNOSIS — M79672 Pain in left foot: Secondary | ICD-10-CM | POA: Diagnosis not present

## 2023-04-21 DIAGNOSIS — M79675 Pain in left toe(s): Secondary | ICD-10-CM | POA: Diagnosis not present

## 2023-04-21 DIAGNOSIS — L110 Acquired keratosis follicularis: Secondary | ICD-10-CM | POA: Diagnosis not present

## 2023-04-21 DIAGNOSIS — M79674 Pain in right toe(s): Secondary | ICD-10-CM | POA: Diagnosis not present

## 2023-04-21 DIAGNOSIS — I739 Peripheral vascular disease, unspecified: Secondary | ICD-10-CM | POA: Diagnosis not present

## 2023-04-22 NOTE — Progress Notes (Unsigned)
 [facility] Return Visit   Referring Provider [name], MD [address] [place],  [place] [address]  Primary Care Provider Lawerance Bach, Bobette Mo, MD  Patient Profile: Patrick Harris is a 78 y.o. male with a past medical history noteworthy for RUL adenocarcinoma and squamous cell carcinoma - post chemotherapy, radiation, immunotherapy, surgery, atherosclerosis, peripheral neuropathy, pituitary insufficiency who returns to the [facility] for follow-up of the problem(s) noted below.  Problem List: Dysphagia, odynophagia, GERD, history esophageal ulcer d/t radiation, 2024 - R/O stricture, persistent esoph ulcer, esophagitis, candida esophagitis Personal history of adenomatous colon polyps History of periumbilical abdominal pain - suspected Imfinzi side effect History of erosive gastritis, 06/2022 -resolved on EGD 12/2022 Constipation  History of Present Illness   Mr. Hirsch was last seen in the GI office 02/26/2023   Current GI Meds  Amitriptyline 10 mg p.o. nightly Omeprazole 40 mg orally daily Sucralfate  Interval History   Dysphagia/odynophagia Fall 2024 Mr. Stadler  was endorsing ongoing dysphagia and odynophagia which may have been a sequelae of his radiation therapy for lung cancer EGD 12/2022 - 2 esophageal stenoses at 26 cm and GE junction status post savory dilation to 17 mm, small HH States that his swallowing is improved at this juncture Has decreased omeprazole from 40 mg p.o. twice daily to omeprazole 40 mg daily Also using an OTC agent called Throat Calm that contains Echinacea and has been beneficial  Abdominal pain At last visit described mid/epigastric abdominal pain that began in May 2024 which he associated with Imfinzi EGD and CT scan 12/2022 were unrevealing Did not have any improvement with omeprazole, Carafate or FDgard Started on amitriptyline 10 mg p.o. nightly in February 2025 Notes substantial improvement in  his symptoms and he is pleased with his progress Has minimal abdominal pain in the morning after eating breakfast which is fleeting but no pain throughout the rest of the day Denies side effects of amitriptyline-no excess sedation, lightheadedness or fatigue  Constipation Previously endorsed mild constipation and stools described as "small balls" Has been using Metamucil and MiraLAX resulting in more regular bowel movements He does not feel that the severity of this to patient correlates with his abdominal pain  History of adenomatous colon polyps Colonoscopy 12/2022 - 3 sessile polyps in TC, left colon diverticula, EH, IH Pathology letter recommended surveillance colonoscopy in 3 years -he would be 78 years of age at that time Discussed that depending upon health issues could consider ceasing further surveillance colonoscopies   Last colonoscopy: 12/2022 -3 sessile polyps in TC, left colon diverticula, EH, IH Last endoscopy: 12/2022 -2 esophageal stenoses at 26 cm and GE junction status post savory dilation to 17 mm, small HH  Last Abd CT/CTE/MRE: CTAP 12/2022 -no intra-abdominal abnormalities  GI Review of Symptoms Significant for abdominal pain -improved, constipation. Otherwise negative.  General Review of Systems  Review of systems is significant for the pertinent positives and negatives as listed per the HPI.  Full ROS is otherwise negative.  Past Medical History   Past Medical History:  Diagnosis Date   Arthritis    CAD (coronary artery disease)    CABG with LIMA to LAD and RIMA to RCA August 2000 - Dr. Dorris Fetch   Cancer [facility])    basal cell   CHF (congestive heart failure) (HCC)    COPD (chronic obstructive pulmonary disease) (HCC)    Elevated glycated hemoglobin    Essential hypertension    GERD (gastroesophageal reflux disease)  Heart murmur    History of radiation therapy    Right lung(chest) 03/19/22-05/06/22- Dr. Retta Caster   History of radiation therapy     Right Lung- 02/25/23-03/17/23- Dr. Retta Caster   Hyperlipidemia    Hypopituitarism [facility])    Lung cancer (HCC)    Osteopenia    Dr Celinda Collar   Pancreatitis    Peyronie disease    Shingles    Testosterone deficiency    Dr Celinda Collar     Past Surgical History   Past Surgical History:  Procedure Laterality Date   Bone spur  2006   5th toe bilaterally   BRONCHIAL BIOPSY  07/02/2021   Procedure: BRONCHIAL BIOPSIES;  Surgeon: Denson Flake, MD;  Location: [facility] ENDOSCOPY;  Service: Pulmonary;;   BRONCHIAL BRUSHINGS  07/02/2021   Procedure: BRONCHIAL BRUSHINGS;  Surgeon: Denson Flake, MD;  Location: [facility] ENDOSCOPY;  Service: Pulmonary;;   BRONCHIAL NEEDLE ASPIRATION BIOPSY  07/02/2021   Procedure: BRONCHIAL NEEDLE ASPIRATION BIOPSIES;  Surgeon: Denson Flake, MD;  Location: [facility] ENDOSCOPY;  Service: Pulmonary;;   COLONOSCOPY  2007   negative; Dr Adan Holms   CORONARY ARTERY BYPASS GRAFT  2001   2 vessels   FIDUCIAL MARKER PLACEMENT  07/02/2021   Procedure: FIDUCIAL MARKER PLACEMENT;  Surgeon: Denson Flake, MD;  Location: [facility] ENDOSCOPY;  Service: Pulmonary;;   fracture heel  1967   Bil/ due to fall off ladder   Fractured calcaneus  1969   bilaterally w/ ankle fusion   HERNIA REPAIR  1984   right inguinal hernia   KNEE SURGERY Left    LOBECTOMY Right 11/13/2021   MENISECTOMY  2007   R medial, Dr. Murrel Arnt   PORTA CATH INSERTION     SHOULDER SURGERY  1994   left   UPPER GASTROINTESTINAL ENDOSCOPY  2007   GERD, Dr Adan Holms   VASECTOMY     VIDEO BRONCHOSCOPY WITH RADIAL ENDOBRONCHIAL ULTRASOUND  07/02/2021   Procedure: VIDEO BRONCHOSCOPY WITH RADIAL ENDOBRONCHIAL ULTRASOUND;  Surgeon: Denson Flake, MD;  Location: [facility] ENDOSCOPY;  Service: Pulmonary;;     Allergies and Medications   Allergies  Allergen Reactions   Latex Dermatitis    Contact  dermatitis   Bromocriptine Nausea Only    Dry heaves   Zestril [Lisinopril] Other (See Comments)    Leg pain, inc HR, dizziness,  headache   Diovan [Valsartan] Nausea Only   Hydrochlorothiazide Other (See Comments)    Possibly caused pancreatitis 2018   Norvasc [Amlodipine] Other (See Comments)    Edema    Zocor [Simvastatin]     Joint pain    Current Meds  Medication Sig   amitriptyline (ELAVIL) 10 MG tablet Take 1 tablet (10 mg total) by mouth at bedtime.   aspirin EC 81 MG tablet Take 1 tablet (81 mg total) by mouth at bedtime. Okay to restart this medication on 07/03/2021   Cholecalciferol (VITAMIN D3) 2000 UNITS TABS Take 2,000 Units by mouth in the morning and at bedtime.   Coenzyme Q10 (COQ-10) 100 MG CAPS Take 100 mg by mouth in the morning.   finasteride (PROSCAR) 5 MG tablet Take 5 mg by mouth daily.   gabapentin (NEURONTIN) 300 MG capsule Take 1 capsule (300 mg total) by mouth 3 (three) times daily.   irbesartan (AVAPRO) 75 MG tablet TAKE 1 TABLET BY MOUTH EVERY DAY   nitroGLYCERIN (NITROSTAT) 0.4 MG SL tablet Place 1 tablet (0.4 mg total) under the tongue every 5 (five) minutes x 3 doses as  needed for chest pain (if no relief after 2nd dose, proceed to ED or call 911).   Omega-3 Fatty Acids (OMEGA-3 FISH OIL PO) Take 1,000 mg by mouth in the morning.   polyethylene glycol (MIRALAX / GLYCOLAX) 17 g packet Take 17 g by mouth as needed.   rosuvastatin (CRESTOR) 20 MG tablet TAKE HALF A TABLET (10 MG) BY MOUTH ONCE DAILY.   sucralfate (CARAFATE) 1 g tablet Take 1 tablet (1 g total) by mouth 4 (four) times daily -  with meals and at bedtime. Crush and dissolve in 10 mL warm water prior to swallowing, take 20 min before meals   tamsulosin (FLOMAX) 0.4 MG CAPS capsule Take 0.4 mg by mouth in the morning.   Testosterone 20.25 MG/ACT (1.62%) GEL Apply 2 Pump topically in the morning.   triamcinolone cream (KENALOG) 0.1 % Apply 1 Application topically 2 (two) times daily.   vitamin C (ASCORBIC ACID) 500 MG tablet Take 500 mg by mouth in the morning.   Vitamin E (VITAMIN E/D-ALPHA NATURAL) 268 MG (400 UNIT) CAPS  Take 1 tablet by mouth daily.   [DISCONTINUED] omeprazole (PRILOSEC) 40 MG capsule Take 1 capsule (40 mg total) by mouth 2 (two) times daily.     Family History   Family History  Problem Relation Age of Onset   Diabetes Mother    Stroke Mother 40   Hypertension Mother    Heart attack Father 77   Leukemia Sister    Parkinson's disease Sister    Stroke Sister    Stroke Sister    Stroke Sister    Atrial fibrillation Sister    Prostate cancer Brother    Aortic aneurysm Brother        abdominal   Heart disease Brother    Atrial fibrillation Brother    Cancer Maternal Uncle         X3:prostate , renal, bone    Colon cancer Paternal Uncle    Breast cancer Daughter    Esophageal cancer Neg Hx    Rectal cancer Neg Hx    Stomach cancer Neg Hx     Social History   Social History   Tobacco Use   Smoking status: Former    Current packs/day: 0.00    Types: Cigarettes    Quit date: 05/2021    Years since quitting: 1.9    Passive exposure: Past   Smokeless tobacco: Never  Vaping Use   Vaping status: Never Used  Substance Use Topics   Alcohol use: No   Drug use: No   Vandell reports that he quit smoking about 1 years ago. His smoking use included cigarettes. He has been exposed to tobacco smoke. He has never used smokeless tobacco. He reports that he does not drink alcohol and does not use drugs.  Vital Signs and Physical Examination   Vitals:   04/23/23 1448  BP: 124/70  Pulse: 70  SpO2: 96%    Body mass index is 27 kg/m. Weight: 193 lb 9.6 oz (87.8 kg)  General: Well developed, well nourished, no acute distress Head: Normocephalic and atraumatic Eyes: Sclerae anicteric, EOMI Lungs: Clear throughout to auscultation Heart: Regular rate and rhythm; No murmurs, rubs or bruits Abdomen: Soft, non tender and non distended. No masses, hepatosplenomegaly or hernias noted. Normal Bowel sounds Rectal:Deferred Musculoskeletal: Symmetrical with no gross deformities     Review of Data  The following data was reviewed at the time of this encounter:  Laboratory Studies  Latest Ref Rng & Units 02/06/2023   10:00 AM 01/07/2023    8:53 AM 10/10/2022    8:54 AM  CBC  WBC 4.0 - 10.5 K/uL 3.7  3.9  3.7   Hemoglobin 13.0 - 17.0 g/dL 91.4  78.2  95.6   Hematocrit 39.0 - 52.0 % 41.7  38.6  39.2   Platelets 150 - 400 K/uL 150  151  133     Lab Results  Component Value Date   LIPASE <10 (L) 06/21/2022      Latest Ref Rng & Units 02/06/2023   10:00 AM 01/07/2023    8:53 AM 10/10/2022    8:54 AM  CMP  Glucose 70 - 99 mg/dL 213  086  578   BUN 8 - 23 mg/dL 17  18  18    Creatinine 0.61 - 1.24 mg/dL 4.69  6.29  5.28   Sodium 135 - 145 mmol/L 139  140  140   Potassium 3.5 - 5.1 mmol/L 4.4  3.9  4.0   Chloride 98 - 111 mmol/L 107  107  106   CO2 22 - 32 mmol/L 28  29  30    Calcium 8.9 - 10.3 mg/dL 9.6  9.2  9.4   Total Protein 6.5 - 8.1 g/dL 6.9  6.6  6.7   Total Bilirubin 0.0 - 1.2 mg/dL 0.5  0.6  0.6   Alkaline Phos 38 - 126 U/L 65  60  58   AST 15 - 41 U/L 18  17  18    ALT 0 - 44 U/L 14  16  13       Imaging Studies  CT Chest/Abd/Pelvis 12/2022 Developing abnormal lymph node along the thoracic inlet on the right side worrisome for malignant node. Recommend further evaluation. PET-CT scan may be useful as the next step in the workup.   Otherwise stable posttreatment changes along the right upper thorax with scarring, pleural thickening.   Diffuse atherosclerotic disease of the aorta and branch vessels with the areas of significant stenosis seen particularly along the origin of the right renal artery and common iliac vessels. Please correlate with any particular symptoms. Additional workup as clinically appropriate.   Enlarged prostate with mass effect along the bladder. Please correlate with patient's PSA.   Few scattered colonic diverticula.   GI Procedures and Studies  EGD/colonoscopy 12/2022 EGD - 2 esophageal stenoses at 26  cm and GE junction status post savory dilation to 17 mm, small HH Colonoscopy - 3 sessile polyps (TA) in TC , left colon diverticula, EH, IH  EGD 06/2022 1 superficial esophageal ulcer without bleeding 26 cm from the incisors, gastric erosion, small HH Path: Reactive gastropathy, acute esophagitis with ulceration, negative for CMV; HSV equivocal  EGD 03/2019 Patchy mildly erythematous mucosa without bleeding in the gastric body and antrum Path: Chronic gastritis, H. pylori negative  Colonoscopy 08/2016 3 sessile polyps (TA) in the DC, TC; sigmoid diverticula, IH  Colonoscopy 07/2005 Normal Path: GERD   Clinical Impression  It is my clinical impression that Mr. Stencel is a 78 y.o. male with;  Dysphagia, odynophagia, GERD, history esophageal ulcer d/t radiation, 2024 - R/O stricture, persistent esoph ulcer, esophagitis, candida esophagitis Personal history of adenomatous colon polyps History of periumbilical abdominal pain -possible Imfinzi side effect History of erosive gastritis, 06/2022 -resolved on EGD 12/2022 Constipation  Mr. Kreiter returns to the office today to follow-up multiple gastrointestinal issues outlined above.  Notes that his dysphagia and odynophagia related to radiation are improved since  undergoing EGD with dilation in December 2024.  He has decreased his omeprazole to 40 mg orally once daily.  Notes improvement in symptoms with use of OTC Throat Calm containing Echinacea.  His mid/epigastric abdominal pain that began in May 2024 and was refractory to PPI, Carafate and FDgard has now improved on amitriptyline.  He reports only fleeting abdominal discomfort after eating breakfast in the morning but no significant pain throughout the rest of the day.  He is pleased with his response to treatment.  He is awaiting follow-up scans in the future related to his lung cancer.  We discussed continuing amitriptyline in the long-term until he is stable from an oncology  perspective.  He has been experiencing constipation that he describes as mild.  He has been able to mitigate this with the use of MiraLAX and Metamucil.  He does not feel that the severity of his constipation explains his abdominal pain or discomfort.  Plan  Continue omeprazole 40 mg p.o. once daily Continue Carafate Continue Throat Calm asneeded Continue amitriptyline 10 mg p.o. nightly. Monitor symptoms of dysphagia and odynophagia with resumption of radiation therapy. Continue dietary modification, Metamucil and MiraLAX for management of constipation. Monitor weight and anthropometrics  Planned Follow Up  4-6 months  The patient or caregiver verbalized understanding of the material covered, with no barriers to understanding. All questions were answered. Patient or caregiver is agreeable with the plan outlined above.    It was a pleasure to see Jeffree.  If you have any questions or concerns regarding this evaluation, do not hesitate to contact me.  Eugenia Hess, MD [facility]   I spent total of 30 minutes in both face-to-face (20 min interview) and non-face-to-face (15 min chart review, coordination of care and documentation) activities, excluding procedures performed, for the visit on the date of this encounter.

## 2023-04-23 ENCOUNTER — Ambulatory Visit: Payer: Medicare Other | Admitting: Pediatrics

## 2023-04-23 ENCOUNTER — Encounter: Payer: Self-pay | Admitting: Pediatrics

## 2023-04-23 VITALS — BP 124/70 | HR 70 | Ht 71.0 in | Wt 193.6 lb

## 2023-04-23 DIAGNOSIS — Z860101 Personal history of adenomatous and serrated colon polyps: Secondary | ICD-10-CM | POA: Diagnosis not present

## 2023-04-23 DIAGNOSIS — K219 Gastro-esophageal reflux disease without esophagitis: Secondary | ICD-10-CM | POA: Diagnosis not present

## 2023-04-23 DIAGNOSIS — R1310 Dysphagia, unspecified: Secondary | ICD-10-CM | POA: Diagnosis not present

## 2023-04-23 DIAGNOSIS — R1084 Generalized abdominal pain: Secondary | ICD-10-CM

## 2023-04-23 DIAGNOSIS — K5900 Constipation, unspecified: Secondary | ICD-10-CM

## 2023-04-23 DIAGNOSIS — Z8719 Personal history of other diseases of the digestive system: Secondary | ICD-10-CM

## 2023-04-23 MED ORDER — OMEPRAZOLE 40 MG PO CPDR: 40.0000 mg | DELAYED_RELEASE_CAPSULE | Freq: Every day | ORAL | 3 refills | Status: DC

## 2023-04-23 NOTE — Patient Instructions (Signed)
 We have sent the following medications to your pharmacy for you to pick up at your convenience: Omeprazole 40mg  daily  Follow up in 4-6 months.  Thank you for entrusting me with your care and for choosing [facility], Dr. Eugenia Hess   _______________________________________________________  If your blood pressure at your visit was 140/90 or greater, please contact your primary care physician to follow up on this.  _______________________________________________________  If you are age 37 or older, your body mass index should be between 23-30. Your Body mass index is 27 kg/m. If this is out of the aforementioned range listed, please consider follow up with your Primary Care Provider.  If you are age 69 or younger, your body mass index should be between 19-25. Your Body mass index is 27 kg/m. If this is out of the aformentioned range listed, please consider follow up with your Primary Care Provider.   ________________________________________________________  The [facility] providers would like to encourage you to use MYCHART to communicate with providers for non-urgent requests or questions.  Due to long hold times on the telephone, sending your provider a message by [facility] may be a faster and more efficient way to get a response.  Please allow 48 business hours for a response.  Please remember that this is for non-urgent requests.  _______________________________________________________

## 2023-04-29 ENCOUNTER — Inpatient Hospital Stay: Payer: Medicare Other | Attending: Internal Medicine

## 2023-04-29 ENCOUNTER — Encounter (HOSPITAL_COMMUNITY): Payer: Self-pay

## 2023-04-29 ENCOUNTER — Ambulatory Visit (HOSPITAL_COMMUNITY)
Admission: RE | Admit: 2023-04-29 | Discharge: 2023-04-29 | Disposition: A | Discharge status: Home / Self Care | Source: Ambulatory Visit | Attending: Internal Medicine | Admitting: Internal Medicine

## 2023-04-29 DIAGNOSIS — K8689 Other specified diseases of pancreas: Secondary | ICD-10-CM | POA: Diagnosis not present

## 2023-04-29 DIAGNOSIS — Z9221 Personal history of antineoplastic chemotherapy: Secondary | ICD-10-CM | POA: Insufficient documentation

## 2023-04-29 DIAGNOSIS — Z95828 Presence of other vascular implants and grafts: Secondary | ICD-10-CM

## 2023-04-29 DIAGNOSIS — C3490 Malignant neoplasm of unspecified part of unspecified bronchus or lung: Secondary | ICD-10-CM

## 2023-04-29 DIAGNOSIS — Z862 Personal history of diseases of the blood and blood-forming organs and certain disorders involving the immune mechanism: Secondary | ICD-10-CM | POA: Insufficient documentation

## 2023-04-29 DIAGNOSIS — Z902 Acquired absence of lung [part of]: Secondary | ICD-10-CM | POA: Insufficient documentation

## 2023-04-29 DIAGNOSIS — J449 Chronic obstructive pulmonary disease, unspecified: Secondary | ICD-10-CM | POA: Insufficient documentation

## 2023-04-29 DIAGNOSIS — B029 Zoster without complications: Secondary | ICD-10-CM | POA: Insufficient documentation

## 2023-04-29 DIAGNOSIS — Z923 Personal history of irradiation: Secondary | ICD-10-CM | POA: Insufficient documentation

## 2023-04-29 DIAGNOSIS — K529 Noninfective gastroenteritis and colitis, unspecified: Secondary | ICD-10-CM | POA: Insufficient documentation

## 2023-04-29 DIAGNOSIS — C3412 Malignant neoplasm of upper lobe, left bronchus or lung: Secondary | ICD-10-CM | POA: Insufficient documentation

## 2023-04-29 DIAGNOSIS — C3411 Malignant neoplasm of upper lobe, right bronchus or lung: Secondary | ICD-10-CM | POA: Insufficient documentation

## 2023-04-29 LAB — CBC WITH DIFFERENTIAL (CANCER CENTER ONLY)
Abs Immature Granulocytes: 0.01 10*3/uL (ref 0.00–0.07)
Basophils Absolute: 0 10*3/uL (ref 0.0–0.1)
Basophils Relative: 0 %
Eosinophils Absolute: 0.1 10*3/uL (ref 0.0–0.5)
Eosinophils Relative: 1 %
HCT: 38.2 % — ABNORMAL LOW (ref 39.0–52.0)
Hemoglobin: 13.1 g/dL (ref 13.0–17.0)
Immature Granulocytes: 0 %
Lymphocytes Relative: 14 %
Lymphs Abs: 0.7 10*3/uL (ref 0.7–4.0)
MCH: 32.3 pg (ref 26.0–34.0)
MCHC: 34.3 g/dL (ref 30.0–36.0)
MCV: 94.3 fL (ref 80.0–100.0)
Monocytes Absolute: 0.5 10*3/uL (ref 0.1–1.0)
Monocytes Relative: 11 %
Neutro Abs: 3.6 10*3/uL (ref 1.7–7.7)
Neutrophils Relative %: 74 %
Platelet Count: 136 10*3/uL — ABNORMAL LOW (ref 150–400)
RBC: 4.05 MIL/uL — ABNORMAL LOW (ref 4.22–5.81)
RDW: 13.7 % (ref 11.5–15.5)
WBC Count: 4.9 10*3/uL (ref 4.0–10.5)
nRBC: 0 % (ref 0.0–0.2)

## 2023-04-29 LAB — CMP (CANCER CENTER ONLY)
ALT: 15 U/L (ref 0–44)
AST: 18 U/L (ref 15–41)
Albumin: 4 g/dL (ref 3.5–5.0)
Alkaline Phosphatase: 60 U/L (ref 38–126)
Anion gap: 3 — ABNORMAL LOW (ref 5–15)
BUN: 24 mg/dL — ABNORMAL HIGH (ref 8–23)
CO2: 30 mmol/L (ref 22–32)
Calcium: 9.2 mg/dL (ref 8.9–10.3)
Chloride: 107 mmol/L (ref 98–111)
Creatinine: 1.07 mg/dL (ref 0.61–1.24)
GFR, Estimated: >60 mL/min (ref >60)
Glucose, Bld: 110 mg/dL — ABNORMAL HIGH (ref 70–99)
Potassium: 4.1 mmol/L (ref 3.5–5.1)
Sodium: 140 mmol/L (ref 135–145)
Total Bilirubin: 0.6 mg/dL (ref 0.0–1.2)
Total Protein: 6.6 g/dL (ref 6.5–8.1)

## 2023-04-29 MED ORDER — IOHEXOL 300 MG/ML  SOLN
100.0000 mL | Freq: Once | INTRAMUSCULAR | Status: AC | PRN
  Administered 2023-04-29: 100 mL via INTRAVENOUS

## 2023-04-29 MED ORDER — SODIUM CHLORIDE 0.9% FLUSH
10.0000 mL | Freq: Once | INTRAVENOUS | Status: AC
  Administered 2023-04-29: 10 mL

## 2023-04-29 MED ORDER — HEPARIN SOD (PORK) LOCK FLUSH 100 UNIT/ML IV SOLN
INTRAVENOUS | Status: AC
  Filled 2023-04-29: qty 5

## 2023-04-29 MED ORDER — HEPARIN SOD (PORK) LOCK FLUSH 100 UNIT/ML IV SOLN
500.0000 [IU] | Freq: Once | INTRAVENOUS | Status: AC
  Administered 2023-04-29: 500 [IU] via INTRAVENOUS

## 2023-04-29 MED ORDER — SODIUM CHLORIDE (PF) 0.9 % IJ SOLN
INTRAMUSCULAR | Status: AC
  Filled 2023-04-29: qty 50

## 2023-05-06 ENCOUNTER — Inpatient Hospital Stay (HOSPITAL_BASED_OUTPATIENT_CLINIC_OR_DEPARTMENT_OTHER): Payer: Medicare Other | Admitting: Internal Medicine

## 2023-05-06 VITALS — BP 133/64 | HR 71 | Temp 97.4°F | Resp 18 | Ht 71.0 in | Wt 195.5 lb

## 2023-05-06 DIAGNOSIS — Z902 Acquired absence of lung [part of]: Secondary | ICD-10-CM | POA: Diagnosis not present

## 2023-05-06 DIAGNOSIS — C3490 Malignant neoplasm of unspecified part of unspecified bronchus or lung: Secondary | ICD-10-CM | POA: Diagnosis not present

## 2023-05-06 DIAGNOSIS — C3411 Malignant neoplasm of upper lobe, right bronchus or lung: Secondary | ICD-10-CM | POA: Diagnosis not present

## 2023-05-06 DIAGNOSIS — J449 Chronic obstructive pulmonary disease, unspecified: Secondary | ICD-10-CM | POA: Diagnosis not present

## 2023-05-06 DIAGNOSIS — B029 Zoster without complications: Secondary | ICD-10-CM | POA: Diagnosis not present

## 2023-05-06 DIAGNOSIS — Z923 Personal history of irradiation: Secondary | ICD-10-CM | POA: Diagnosis not present

## 2023-05-06 DIAGNOSIS — K529 Noninfective gastroenteritis and colitis, unspecified: Secondary | ICD-10-CM | POA: Diagnosis not present

## 2023-05-06 DIAGNOSIS — Z9221 Personal history of antineoplastic chemotherapy: Secondary | ICD-10-CM | POA: Diagnosis not present

## 2023-05-06 DIAGNOSIS — C3412 Malignant neoplasm of upper lobe, left bronchus or lung: Secondary | ICD-10-CM | POA: Diagnosis not present

## 2023-05-06 DIAGNOSIS — Z862 Personal history of diseases of the blood and blood-forming organs and certain disorders involving the immune mechanism: Secondary | ICD-10-CM | POA: Diagnosis not present

## 2023-05-06 NOTE — Progress Notes (Signed)
 [facility] [phone]   [phone]  OFFICE PROGRESS NOTE  Patrick Dauphin, MD [address] [place] [address]  DIAGNOSIS:  Stage IIIA (T1c, N 2, M0) adenocarcinoma in the right upper lobe as well as stage IA (T1c, N0, M0) squamous cell carcinoma involving the right upper lobe and stage IA (T1b, N0, M0) adenocarcinoma involving the left upper lobe diagnosed in June 2023.  PRIOR THERAPY:  1) status post neoadjuvant systemic chemotherapy with carboplatin , paclitaxel  and nivolumab  for 3 cycles in [place]  followed by right upper lobectomy with lymph node dissection at [facility] under the care of Dr. Vickey Grammes with the final pathology showing residual disease with synchronous adenocarcinoma as well as squamous cell carcinoma in the right upper lobe and multiple mediastinal lymph node involvement performed on November 13, 2021. 2)  evidence for disease recurrence with involvement of 4R lymphadenopathy consistent with metastatic adenocarcinoma on February 26, 2022 biopsy. Molecular studies showed no actionable mutations and PD-L1 expression was 15% in the adenocarcinoma in 2% in the squamous cell carcinoma. 3) Concurrent chemotherapy with carboplatin  for AUC of 5 and Alimta 500 Mg/M2 every 3 weeks for 2 cycles during the course of radiotherapy.  Last dose was giving 04/08/2022. 4) Consolidation treatment with immunotherapy with Imfinzi  1500 Mg IV every 4 weeks.  First dose May 29, 2022 status post 1 cycle.  This treatment was discontinued secondary to intolerance  CURRENT THERAPY: Observation  INTERVAL HISTORY: Patrick Harris 78 y.o. male returns to the clinic today for follow-up visit accompanied by his wife.  Discussed the use of AI scribe software for clinical note transcription with the patient, who gave verbal consent to proceed.  History of Present Illness   Patrick Harris is a 78 year old male with stage three non-small cell  lung cancer who presents for evaluation with repeat CT scan for restaging of his disease.  He was diagnosed with stage three non-small cell lung cancer in June 2023 and underwent concurrent chemoradiation with carboplatin  and Alimta for two cycles, followed by one cycle of consolidation treatment with durvalumab . The immunotherapy was discontinued due to intolerance and persistent diarrhea. A recent CT scan shows a reduction in the size of the lymph node in the anterior right mediastinum, indicating improvement with no evidence of disease progression.  He experienced significant diarrhea and colitis during immunotherapy, leading to the discontinuation of the treatment. A new gastroenterologist prescribed amitriptyline , which resolved his stomach issues within three days. He continues to take amitriptyline  as advised.  He has difficulty regaining strength and does not engage in exercise. He also has a cough with sputum, sometimes with blood, consistent with his history of lung cancer and COPD. He [place] large property and has been more active recently, engaging in outdoor activities with his wife's assistance.  He had a recent episode of shingles affecting his torso six weeks ago, which he describes as painful.  He experiences burning sensations in his legs while walking, which he suspects might be related to circulation issues. His hemoglobin level is currently 13.1, which is within the normal range, and he does not have anemia at present. He had anemia in the past, with a hemoglobin level of 10.4 in July 2024, but his levels have since improved.        MEDICAL HISTORY: Past Medical History:  Diagnosis Date   Arthritis    CAD (coronary artery disease)    CABG  with LIMA to LAD and RIMA to RCA August 2000 - Dr. Luna Salinas   [facility])    basal cell   CHF (congestive heart failure) (HCC)    COPD (chronic obstructive pulmonary disease) (HCC)    Elevated glycated hemoglobin    Essential  hypertension    GERD (gastroesophageal reflux disease)    Heart murmur    History of radiation therapy    Right lung(chest) 03/19/22-05/06/22- Dr. Retta Caster   History of radiation therapy    Right Lung- 02/25/23-03/17/23- Dr. Retta Caster   Hyperlipidemia    Hypopituitarism [facility])    Lung cancer (HCC)    Osteopenia    Dr Celinda Collar   Pancreatitis    Peyronie disease    Shingles    Testosterone  deficiency    Dr Celinda Collar    ALLERGIES:  is allergic to latex, bromocriptine, zestril  [lisinopril ], diovan  [valsartan ], hydrochlorothiazide , norvasc  [amlodipine ], and zocor [simvastatin].  MEDICATIONS:  Current Outpatient Medications  Medication Sig Dispense Refill   amitriptyline  (ELAVIL ) 10 MG tablet Take 1 tablet (10 mg total) by mouth at bedtime. 90 tablet 1   aspirin  EC 81 MG tablet Take 1 tablet (81 mg total) by mouth at bedtime. Okay to restart this medication on 07/03/2021 30 tablet 11   Cholecalciferol (VITAMIN D3) 2000 UNITS TABS Take 2,000 Units by mouth in the morning and at bedtime.     Coenzyme Q10 (COQ-10) 100 MG CAPS Take 100 mg by mouth in the morning.     finasteride (PROSCAR) 5 MG tablet Take 5 mg by mouth daily.     gabapentin  (NEURONTIN ) 300 MG capsule Take 1 capsule (300 mg total) by mouth 3 (three) times daily. 60 capsule 3   irbesartan  (AVAPRO ) 75 MG tablet TAKE 1 TABLET BY MOUTH EVERY DAY 90 tablet 1   nitroGLYCERIN  (NITROSTAT ) 0.4 MG SL tablet Place 1 tablet (0.4 mg total) under the tongue every 5 (five) minutes x 3 doses as needed for chest pain (if no relief after 2nd dose, proceed to ED or call 911). 25 tablet 3   Omega-3 Fatty Acids (OMEGA-3 FISH OIL PO) Take 1,000 mg by mouth in the morning.     omeprazole  (PRILOSEC ) 40 MG capsule Take 1 capsule (40 mg total) by mouth daily. 90 capsule 3   polyethylene glycol (MIRALAX / GLYCOLAX) 17 g packet Take 17 g by mouth as needed.     rosuvastatin  (CRESTOR ) 20 MG tablet TAKE HALF A TABLET (10 MG) BY MOUTH ONCE DAILY. 45 tablet 3    sucralfate  (CARAFATE ) 1 g tablet Take 1 tablet (1 g total) by mouth 4 (four) times daily -  with meals and at bedtime. Crush and dissolve in 10 mL warm water prior to swallowing, take 20 min before meals 120 tablet 2   tamsulosin  (FLOMAX ) 0.4 MG CAPS capsule Take 0.4 mg by mouth in the morning.     Testosterone  20.25 MG/ACT (1.62%) GEL Apply 2 Pump topically in the morning.     triamcinolone  cream (KENALOG ) 0.1 % Apply 1 Application topically 2 (two) times daily. 30 g 0   vitamin C (ASCORBIC ACID) 500 MG tablet Take 500 mg by mouth in the morning.     Vitamin E (VITAMIN E/D-ALPHA NATURAL) 268 MG (400 UNIT) CAPS Take 1 tablet by mouth daily.     No current facility-administered medications for this visit.    SURGICAL HISTORY:  Past Surgical History:  Procedure Laterality Date   Bone spur  2006   5th toe bilaterally  BRONCHIAL BIOPSY  07/02/2021   Procedure: BRONCHIAL BIOPSIES;  Surgeon: Denson Flake, MD;  Location: [facility] ENDOSCOPY;  Service: Pulmonary;;   BRONCHIAL BRUSHINGS  07/02/2021   Procedure: BRONCHIAL BRUSHINGS;  Surgeon: Denson Flake, MD;  Location: [facility] ENDOSCOPY;  Service: Pulmonary;;   BRONCHIAL NEEDLE ASPIRATION BIOPSY  07/02/2021   Procedure: BRONCHIAL NEEDLE ASPIRATION BIOPSIES;  Surgeon: Denson Flake, MD;  Location: [facility] ENDOSCOPY;  Service: Pulmonary;;   COLONOSCOPY  2007   negative; Dr Adan Holms   CORONARY ARTERY BYPASS GRAFT  2001   2 vessels   FIDUCIAL MARKER PLACEMENT  07/02/2021   Procedure: FIDUCIAL MARKER PLACEMENT;  Surgeon: Denson Flake, MD;  Location: [facility] ENDOSCOPY;  Service: Pulmonary;;   fracture heel  1967   Bil/ due to fall off ladder   Fractured calcaneus  1969   bilaterally w/ ankle fusion   HERNIA REPAIR  1984   right inguinal hernia   KNEE SURGERY Left    LOBECTOMY Right 11/13/2021   MENISECTOMY  2007   R medial, Dr. Murrel Arnt   PORTA CATH INSERTION     SHOULDER SURGERY  1994   left   UPPER GASTROINTESTINAL ENDOSCOPY  2007   GERD, Dr  Adan Holms   VASECTOMY     VIDEO BRONCHOSCOPY WITH RADIAL ENDOBRONCHIAL ULTRASOUND  07/02/2021   Procedure: VIDEO BRONCHOSCOPY WITH RADIAL ENDOBRONCHIAL ULTRASOUND;  Surgeon: Denson Flake, MD;  Location: [facility] ENDOSCOPY;  Service: Pulmonary;;    REVIEW OF SYSTEMS:  Constitutional: positive for fatigue Eyes: negative Ears, nose, mouth, throat, and face: negative Respiratory: positive for cough and sputum Cardiovascular: negative Gastrointestinal: negative Genitourinary:negative Integument/breast: negative Hematologic/lymphatic: negative Musculoskeletal:negative Neurological: negative Behavioral/Psych: negative Endocrine: negative Allergic/Immunologic: negative   PHYSICAL EXAMINATION: General appearance: alert, cooperative, fatigued, and no distress Head: Normocephalic, without obvious abnormality, atraumatic Neck: no adenopathy, no JVD, supple, symmetrical, trachea midline, and thyroid  not enlarged, symmetric, no tenderness/mass/nodules Lymph nodes: Cervical, supraclavicular, and axillary nodes normal. Resp: clear to auscultation bilaterally Back: symmetric, no curvature. ROM normal. No CVA tenderness. Cardio: regular rate and rhythm, S1, S2 normal, no murmur, click, rub or gallop GI: soft, non-tender; bowel sounds normal; no masses,  no organomegaly Extremities: extremities normal, atraumatic, no cyanosis or edema Neurologic: Alert and oriented X 3, normal strength and tone. Normal symmetric reflexes. Normal coordination and gait  ECOG PERFORMANCE STATUS: 1 - Symptomatic but completely ambulatory  Blood pressure 133/64, pulse 71, temperature (!) 97.4 F (36.3 C), temperature source Temporal, resp. rate 18, height 5\' 11"  (1.803 m), weight 195 lb 8 oz (88.7 kg), SpO2 94%.  LABORATORY DATA: Lab Results  Component Value Date   WBC 4.9 04/29/2023   HGB 13.1 04/29/2023   HCT 38.2 (L) 04/29/2023   MCV 94.3 04/29/2023   PLT 136 (L) 04/29/2023      Chemistry      Component  Value Date/Time   NA 140 04/29/2023 1005   K 4.1 04/29/2023 1005   CL 107 04/29/2023 1005   CO2 30 04/29/2023 1005   BUN 24 (H) 04/29/2023 1005   CREATININE 1.07 04/29/2023 1005   CREATININE 1.07 08/25/2019 0847      Component Value Date/Time   CALCIUM  9.2 04/29/2023 1005   ALKPHOS 60 04/29/2023 1005   AST 18 04/29/2023 1005   ALT 15 04/29/2023 1005   BILITOT 0.6 04/29/2023 1005       RADIOGRAPHIC STUDIES: CT Chest W Contrast Result Date: 05/01/2023 CLINICAL DATA:  Non-small-cell lung cancer staging. Completed chemotherapy and XRT April 2024. Stopped  immunotherapy. Cough intermittent hemoptysis. Shingles 5 weeks ago. * Tracking Code: BO *. EXAM: CT CHEST, ABDOMEN, AND PELVIS WITH CONTRAST TECHNIQUE: Multidetector CT imaging of the chest, abdomen and pelvis was performed following the standard protocol during bolus administration of intravenous contrast. RADIATION DOSE REDUCTION: This exam was performed according to the departmental dose-optimization program which includes automated exposure control, adjustment of the mA and/or kV according to patient size and/or use of iterative reconstruction technique. CONTRAST:  OMNIPAQUE  IOHEXOL  300 MG/ML  SOLN COMPARISON:  PET-CT scan 01/30/2023.  CT 01/07/2023 and older. FINDINGS: CT CHEST FINDINGS Cardiovascular: Right IJ chest port. Port is accessed. Tip of the catheter extends to the SVC right atrial junction. Heart is nonenlarged. No pericardial effusion. Scattered atherosclerotic changes along the aorta and branch vessels. Status post median sternotomy. Bypass grafts are seen. Mediastinum/Nodes: Normal caliber thoracic esophagus. There is some shift of mediastinal structures from left-to-right including the esophagus and trachea. No abnormal lymph node enlargement identified in the axillary regions, hilum. On the more recent PET-CT scan there is hypermetabolic lesion identified at the thoracic LN on the right side posteriorly measuring 19 mm in  short axis. Today this measures 2.4 x 1.4 cm. Series 3, image 8. On prior chest CT this measured 2.5 x 1.9 cm. Mild soft tissue thickening and stranding elsewhere along the mediastinum including paratracheal is stable. Lungs/Pleura: Advanced emphysematous lung changes identified with centrilobular and paraseptal changes. Left lung is without consolidation, pneumothorax or effusion. Previous PET-CT had an area uptake along the left upper lobe anteriorly with a small ground-glass and nodular focus. The ground-glass component is decreasing. The more solid nodular component is stable measuring 4 mm today. Area today is similar to the older CT scan of 01/07/2023. No new left-sided lung nodule. There is volume loss along the right hemithorax from prior upper lobectomy. Surgical changes are seen with suture, thickening, fibrotic changes. Some pleural calcifications identified posteriorly. Punctate calcified nodule right lower lobe series 6, image 96. No new dominant lung nodule. Musculoskeletal: Slight curvature of the spine with scattered degenerative changes. CT ABDOMEN PELVIS FINDINGS Hepatobiliary: Mild fatty liver infiltration. No space-occupying liver lesion. Patent portal vein. Gallbladder is present. Pancreas: Moderate fatty atrophy of the pancreas. Spleen: Normal in size without focal abnormality. Adrenals/Urinary Tract: Adrenal glands are preserved. Focal atrophy along the anterior aspect of the left mid kidney. No enhancing renal mass, collecting system dilatation. Few tiny low-attenuation lesions along each kidney are too small to completely characterize but likely benign cystic foci. Ureters have normal course and caliber extending down to the bladder. Slight wall thickening of the urinary bladder with some trabeculation. Stomach/Bowel: Moderate colonic stool. Normal appendix. Small bowel is nondilated. There is some small bowel stool appearance identified distally. Nonspecific. Underdistended stomach.  Vascular/Lymphatic: Extensive partially calcified atherosclerotic plaque along the abdominal aorta and branch vessels. There are areas of potential significant stenosis along the right renal artery. Please correlate for level of hypertension. Is also stenosis along the infrarenal abdominal aorta 0 with possible plaque ulceration. Potential area of severe stenosis as well along both common iliac arteries. Please see coronal series 4 image 72. No specific abnormal lymph node enlargement identified in the abdomen and pelvis. Reproductive: Enlarged prostate with mass effect along the base of the bladder. Please correlate with the patient's PSA. Other: No free air or free fluid. Small fat containing inguinal hernias. Musculoskeletal: Curvature of the spine with degenerative changes. Degenerative changes along the hips. IMPRESSION: Right thoracic inlet lymph node is slightly  smaller today. No new areas of nodal enlargement identified. Stable surgical changes of right upper lobectomy with thickening and stranding. Significant atherosclerotic calcified plaque. Again areas of significant stenosis suggested of the right renal artery and common iliac arteries. Please correlate with any symptoms. Enlarged prostate. Fatty liver infiltration. Postop chest Electronically Signed   By: Adrianna Horde M.D.   On: 05/01/2023 16:39   CT ABDOMEN PELVIS W CONTRAST Result Date: 05/01/2023 CLINICAL DATA:  Non-small-cell lung cancer staging. Completed chemotherapy and XRT April 2024. Stopped immunotherapy. Cough intermittent hemoptysis. Shingles 5 weeks ago. * Tracking Code: BO *. EXAM: CT CHEST, ABDOMEN, AND PELVIS WITH CONTRAST TECHNIQUE: Multidetector CT imaging of the chest, abdomen and pelvis was performed following the standard protocol during bolus administration of intravenous contrast. RADIATION DOSE REDUCTION: This exam was performed according to the departmental dose-optimization program which includes automated exposure control,  adjustment of the mA and/or kV according to patient size and/or use of iterative reconstruction technique. CONTRAST:  OMNIPAQUE  IOHEXOL  300 MG/ML  SOLN COMPARISON:  PET-CT scan 01/30/2023.  CT 01/07/2023 and older. FINDINGS: CT CHEST FINDINGS Cardiovascular: Right IJ chest port. Port is accessed. Tip of the catheter extends to the SVC right atrial junction. Heart is nonenlarged. No pericardial effusion. Scattered atherosclerotic changes along the aorta and branch vessels. Status post median sternotomy. Bypass grafts are seen. Mediastinum/Nodes: Normal caliber thoracic esophagus. There is some shift of mediastinal structures from left-to-right including the esophagus and trachea. No abnormal lymph node enlargement identified in the axillary regions, hilum. On the more recent PET-CT scan there is hypermetabolic lesion identified at the thoracic LN on the right side posteriorly measuring 19 mm in short axis. Today this measures 2.4 x 1.4 cm. Series 3, image 8. On prior chest CT this measured 2.5 x 1.9 cm. Mild soft tissue thickening and stranding elsewhere along the mediastinum including paratracheal is stable. Lungs/Pleura: Advanced emphysematous lung changes identified with centrilobular and paraseptal changes. Left lung is without consolidation, pneumothorax or effusion. Previous PET-CT had an area uptake along the left upper lobe anteriorly with a small ground-glass and nodular focus. The ground-glass component is decreasing. The more solid nodular component is stable measuring 4 mm today. Area today is similar to the older CT scan of 01/07/2023. No new left-sided lung nodule. There is volume loss along the right hemithorax from prior upper lobectomy. Surgical changes are seen with suture, thickening, fibrotic changes. Some pleural calcifications identified posteriorly. Punctate calcified nodule right lower lobe series 6, image 96. No new dominant lung nodule. Musculoskeletal: Slight curvature of the spine  with scattered degenerative changes. CT ABDOMEN PELVIS FINDINGS Hepatobiliary: Mild fatty liver infiltration. No space-occupying liver lesion. Patent portal vein. Gallbladder is present. Pancreas: Moderate fatty atrophy of the pancreas. Spleen: Normal in size without focal abnormality. Adrenals/Urinary Tract: Adrenal glands are preserved. Focal atrophy along the anterior aspect of the left mid kidney. No enhancing renal mass, collecting system dilatation. Few tiny low-attenuation lesions along each kidney are too small to completely characterize but likely benign cystic foci. Ureters have normal course and caliber extending down to the bladder. Slight wall thickening of the urinary bladder with some trabeculation. Stomach/Bowel: Moderate colonic stool. Normal appendix. Small bowel is nondilated. There is some small bowel stool appearance identified distally. Nonspecific. Underdistended stomach. Vascular/Lymphatic: Extensive partially calcified atherosclerotic plaque along the abdominal aorta and branch vessels. There are areas of potential significant stenosis along the right renal artery. Please correlate for level of hypertension. Is also stenosis along the infrarenal  abdominal aorta 0 with possible plaque ulceration. Potential area of severe stenosis as well along both common iliac arteries. Please see coronal series 4 image 72. No specific abnormal lymph node enlargement identified in the abdomen and pelvis. Reproductive: Enlarged prostate with mass effect along the base of the bladder. Please correlate with the patient's PSA. Other: No free air or free fluid. Small fat containing inguinal hernias. Musculoskeletal: Curvature of the spine with degenerative changes. Degenerative changes along the hips. IMPRESSION: Right thoracic inlet lymph node is slightly smaller today. No new areas of nodal enlargement identified. Stable surgical changes of right upper lobectomy with thickening and stranding. Significant  atherosclerotic calcified plaque. Again areas of significant stenosis suggested of the right renal artery and common iliac arteries. Please correlate with any symptoms. Enlarged prostate. Fatty liver infiltration. Postop chest Electronically Signed   By: Adrianna Horde M.D.   On: 05/01/2023 16:39    ASSESSMENT AND PLAN: This is a very pleasant 78 years old white male with Stage IIIA (T1c, N 2, M0) adenocarcinoma in the right upper lobe as well as stage IA (T1c, N0, M0) squamous cell carcinoma involving the right upper lobe and stage IA (T1b, N0, M0) adenocarcinoma involving the left upper lobe diagnosed in June 2023. The patient is status post the following treatment: 1) status post neoadjuvant systemic chemotherapy with carboplatin , paclitaxel  and nivolumab  for 3 cycles in [place]  followed by right upper lobectomy with lymph node dissection at [facility] under the care of Dr. Vickey Grammes with the final pathology showing residual disease with synchronous adenocarcinoma as well as squamous cell carcinoma in the right upper lobe and multiple mediastinal lymph node involvement performed on November 13, 2021. 2)  evidence for disease recurrence with involvement of 4R lymphadenopathy consistent with metastatic adenocarcinoma on February 26, 2022 biopsy. Molecular studies showed no actionable mutations and PD-L1 expression was 15% in the adenocarcinoma in 2% in the squamous cell carcinoma. 3) Concurrent chemotherapy with carboplatin  for AUC of 5 and Alimta 500 Mg/M2 every 3 weeks for 2 cycles during the course of radiotherapy.  Last dose was giving 04/08/2022. He tolerated his treatment well except for the chemotherapy-induced odynophagia in addition to fatigue and sore throat. He started treatment with consolidation immunotherapy with Imfinzi  1500 Mg IV every 4 weeks status post 1 cycle.  This was discontinued secondary to intolerance and severe diarrhea managed by his gastroenterologist Dr. Sandrea Cruel. The  patient is currently on observation. He had repeat CT scan of the chest, abdomen pelvis performed recently.  I personally and independently reviewed the scan and discussed the result with the patient and his wife.     Stage 3 non-small cell lung cancer Stage 3 non-small cell lung cancer diagnosed in June 2023. Status post concurrent chemoradiation with carboplatin  and Alimta, followed by one cycle of consolidation treatment with durvalumab , which was discontinued due to intolerance and persistent diarrhea. Currently on observation. Recent CT scan shows reduction in size of the lymph node in the anterior right mediastinum, indicating improvement. No current need for additional treatment as the disease is not progressing. He asked about treatment with Repotrectinib. ROS1 fusion test was negative, ruling out the use of Repotrectinib, which is only effective in lung cancer patients with the ROS1 fusion. - Continue observation with repeat CT scan every 3 months for restaging.  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 Symptoms have resolved with the use of  amitriptyline  as prescribed by the gastroenterologist.  Chronic obstructive pulmonary disease (COPD) COPD with occasional hemoptysis. Symptoms are expected given the history of lung cancer and COPD. - Encourage staying active and exercising to maintain health.  History of anemia Previous anemia with hemoglobin level of 10.4 in July 2024. Current hemoglobin is 13.1, within normal range for males (13-17). No current anemia. - Encourage consumption of red meat to maintain hemoglobin levels.  Shingles (Herpes Zoster) Recent episode of shingles with lesions on the body. Symptoms are still present but improving. - Advise to receive shingles vaccination once recovered from current episode.   The patient was advised to call  immediately if he has any other concerning symptoms in the interval. The patient voices understanding of current disease status and treatment options and is in agreement with the current care plan.  All questions were answered. The patient knows to call the clinic with any problems, questions or concerns. We can certainly see the patient much sooner if necessary.  The total time spent in the appointment was 30 minutes.  Disclaimer: This note was dictated with voice recognition software. Similar sounding words can inadvertently be transcribed and may not be corrected upon review.

## 2023-05-13 ENCOUNTER — Inpatient Hospital Stay: Payer: Medicare Other | Attending: Internal Medicine

## 2023-05-13 DIAGNOSIS — C3412 Malignant neoplasm of upper lobe, left bronchus or lung: Secondary | ICD-10-CM | POA: Diagnosis not present

## 2023-05-13 DIAGNOSIS — Z95828 Presence of other vascular implants and grafts: Secondary | ICD-10-CM

## 2023-05-13 DIAGNOSIS — C3491 Malignant neoplasm of unspecified part of right bronchus or lung: Secondary | ICD-10-CM | POA: Diagnosis not present

## 2023-05-13 DIAGNOSIS — C3411 Malignant neoplasm of upper lobe, right bronchus or lung: Secondary | ICD-10-CM | POA: Insufficient documentation

## 2023-05-13 MED ORDER — SODIUM CHLORIDE 0.9% FLUSH
10.0000 mL | Freq: Once | INTRAVENOUS | Status: AC
  Administered 2023-05-13: 10 mL

## 2023-05-13 MED ORDER — HEPARIN SOD (PORK) LOCK FLUSH 100 UNIT/ML IV SOLN
500.0000 [IU] | Freq: Once | INTRAVENOUS | Status: AC
  Administered 2023-05-13: 500 [IU]

## 2023-05-13 NOTE — Progress Notes (Signed)
 05/13/2023  12:17 PM  PATIENT PROFILE: [name] is a 78 y.o.  who is seen in consultation for a diagnosis of NSCLC (synchronous primaries- adenocarcinoma & squamous cell carcinoma).  ONCOLOGIC PROBLEM LIST:  1) NSCLC (Synchronous Primaries- Adenocarcinoma & Squamous Cell Carcinoma) - 06/05/21 CT showing bilat lung nodules. - 06/29/21 RUL biopsy yielding malignant cells +P63 squamous cell carcinoma; LUL FNA showing NSCLC favor adenocarcinoma. - 07/23/21 PET CT showing Two hypermetabolic nodules of the right upper lobe and a hypermetabolic nodule of the left upper lobe, compatible with history of primary lung malignancy. Hypermetabolic right hilar lymph node, compatible with nodal metastatic disease. Subpleural nodule of the superior portion of the right lower lobe  right lower lobe demonstrates mild FDG uptake below mediastinal  blood pool, indolent primary lung malignancy is not excluded.  - 07/23/21 brain MRI NED - 08/07/21 bronchoscopy with lv 7 and 4R neg, 11R positive NSCLC TTF1/P40 neg - 08/15/21: Initial appointment with Patrick Harris.  Case discussed at [facility].  Plan for neoadjuvant carboplatin /paclitaxel /nivolumab  for 3 cycles followed by surgical resection.  Systemic treatment at [facility] - 09/11/21-10/23/21: Neoadjuvant Carbo/Taxol Rue for 3 cycles with Patrick Harris. - 11/13/21: VATS RUL lobectomy with MLND with Patrick Harris.  Course complicated by left pneumothorax and ICU stay.  Pathology revealed two synchronous carcinomas: 1) poorly differentiated adenocarcinoma with extensive LVI (~0.9cm residual viable tumor) with negative margins, 14/24 LN positive for adenocarcinoma, staged pT1a pN2  moderately to poorly differentiated squamous cell carcinoma (0.2cm residual viable tumor) with negative margins, 0/24 LN positive for malignancy, staged pT1a pN0.  PD-L1 15% in adenocarcinoma.  PD-L1 2% in squamous cell carcinoma.  TMB 18.5.  NGS with no targetable mutations. - 12/05/21: CT Chest with  post surgical changes.  Decreased solid component of irregular nodule/opacity in medial left lung apex, multifocal new groundglass and nodular pulmonary opacities. - 01/18/22: PET CT with intensely hypermetabolic precarinal node concerning for metastasis and other mildly active bilateral hilar nodes favored to be reactive. - 02/11/22: Follow up with Patrick Harris.  Plan for bronchoscopy & biopsy of precarinal LN. - 02/26/22: Bronch/Bx of Level 4R LN with NSCLC, favored poorly differentiated adenocarcinoma similar to level 4 LN excision.   04/08/2022 Concurrent chemotherapy with carboplatin  for AUC of 5 and Alimta  500 Mg/M2 every 3 weeks for 2 cycles during the course of radiotherapy Consolidation treatment with immunotherapy with Imfinzi  1500 Mg IV every 4 weeks. First dose May 29, 2022 status post 1 cycle. This treatment was discontinued secondary to intolerance  01/07/23 CT showing Developing abnormal lymph node along the thoracic inlet on the right side worrisome for malignant node.  01/26/23 brain MRI NED 01/30/23 PETCT showing . Intensely hypermetabolic high RIGHT paratracheal lymph node consistent with recurrent nodal metastasis.  03/17/23 completion of 30 Gy IMRT to the R paratracheal LN.   BIOMARKER ANALYSIS: PD-L1 15% on RUL adeno PD-L1 2% on RUL squam     Genomic signatures  Signature Status/Score  Microsatellite Analysis MSS  *TMB Muts/Mb (Sequenced) 18.5   Tier I: Variants of Strong Clinical Significance  Pathogenic variants with clear and specific diagnostic, prognostic, and therapeutic significance were NOT detected.         Tier II. Other Remarkable Genetic Variants  Variant Name Variant Type Variant Location Allele Frequency  CDC73([id number])c.829-1G>T(n/a) Splice Acceptor Variant exon 23 11.9%  CDKN1C([id number])c.881_882delinsGCT(p.Pro294Argfs*71) Frameshift Variant exon 2 12.2%  SMARCA4([id number])c.860-2A>T(n/a) Splice Acceptor Variant exon 7 11.9%   TP53(NM_[id number])c.747G>C(p.Arg249Ser) Missense Variant exon 7 13.8%    Staging:  Stage III  HISTORY OF PRESENT ILLNESS: Briefly 78 year old gentleman with history of CAD and prior smoking with oncologic history as above who briefly underwent screening CT scan earlier this spring showing bilateral lung nodules.  He is subsequently referred for bronchoscopy with right upper lobe and left upper lobe biopsies demonstrating squamous of carcinoma and adenocarcinoma respectively.  Subsequent PET/CT demonstrated multiple hypermetabolic areas.  He was seen by [facility] thoracic surgery here.  Subsequent bronchoscopy done at [facility] showed level 4R and 7 negative but 11 are positive for nonsmall cell cancer.    INTERVAL HISTORY: Doing well overall without new symptoms.  Persistent fatigue though remains active with gardening and activities around the home.    REVIEW OF SYSTEMS:  Distress Thermometer Score (0-10)         Issues Response Comments  Practical Problems    Housing   No   Insurance/Financial   No   Transportation   No   Work/School   No   Treatment decisions   No   Family Problems    Dealing with children   No   Dealing with partner   No   Ability to have children   No   Family health issues   No   Emotional Problems    Depression   No   Fears   No   Nervousness   No   Sadness   No   Worry   No   Loss of interest in usual activities   No   Spiritual/Religious concerns   No    PHYSICAL SYMPTOMS Response Comments  Appearance (including alopecia)     Bathing/Dressing     Breathing     Changes in urination (including dysuria)     Constipation     Diarrhea     Eating (including change in taste and appetite)     Fatigue     Feeling swollen (including abdominal swelling)     Fevers     Getting around     Indigestion     Memory/Concentration (including     Mouth sores     Nausea and/or vomiting     Nose dry/congested     Pain (any location)     Sexual issues     Skin  dry/itchy     Sleep     Tingling in hands/feet      PAST MEDICAL HISTORY: Past Medical History:  Diagnosis Date  . Anesthesia complication    urinary retention, post ankle surgery in 1969.  SABRA CAD (coronary artery disease) 07/30/2021  . COPD (chronic obstructive pulmonary disease) (CMS-HCC)   . Lung cancer (CMS-HCC) 07/30/2021  . Preoperative evaluation to rule out surgical contraindication 11/12/2021  . Tobacco abuse 07/30/2021   PAST SURGICAL HISTORY Past Surgical History:  Procedure Laterality Date  . FUSION ARTHRODESIS ANKLE W/ARTHROSCOPY  1969  . INGUINAL HERNIA REPAIR Right 1989  . OTHER SURGERY  2000   Heart By Pass  . KNEE ARTHROSCOPY Right 2003   meniscal tear repair  . CATARACT EXTRACTION Bilateral 2022  . BRONCHOSCOPY Bilateral 08/07/2021   Procedure: BRONCHOSCOPY, RIGID OR FLEXIBLE, INCL FLUOROSCOPIC GUIDANCE; ENDOBRONCHIAL ULTRASOUND (EBUS) TRANSTRACHEAL AND/OR TRANSBRONCHIAL SAMPLING, 3/MORE MEDIASTINAL AND/OR HILAR LYMPH NODE STATION/STRUCTURE;  Surgeon: Bjorn Signe Smart, MD;  [facility];  Service: Pulmonary;  Laterality: Bilateral;  . BRONCHOSCOPY  08/07/2021   Procedure: BRONCHOSCOPY, RIGID OR FLEXIBLE, INCLUDING FLUOROSCOPIC GUIDANCE, WHEN PERFORMED; WITH BRONCHIAL ALVEOLAR LAVAGE;  Surgeon: Bjorn Signe Smart, MD;  [facility];  Service: Pulmonary;;  . THORACOSCOPY WITH LOBECTOMY LUNG Right 11/13/2021   Procedure: THORACOSCOPY, SURGICAL; WITH LOBECTOMY (SINGLE LOBE);  Surgeon: Harris Dickey Fitch, MD;  Location: [facility];  Service: Cardiothoracic;  Laterality: Right;  . THORACOSCOPY WITH MEDIASTINAL AND REGIONAL LYMPHADENECTOMY Right 11/13/2021   Procedure: THORACOSCOPY, SURGICAL; WITH MEDIASTINAL AND REGIONAL LYMPHADENECTOMY (LIST IN ADDITION TO PRIMARY PROCEDURE);  Surgeon: Harris Dickey Fitch, MD;  Location: [facility];  Service: Cardiothoracic;   Laterality: Right;  . DECORTICATION LUNG Right 11/13/2021   Procedure: DECORTICATION, PULMONARY (SEPARATE PROCEDURE); PARTIAL;  Surgeon: Harris Dickey Fitch, MD;  Location: [facility];  Service: Cardiothoracic;  Laterality: Right;  . BRONCHOSCOPY Bilateral 02/26/2022   Procedure: BRONCHOSCOPY, RIGID OR FLEXIBLE, INCL FLUOROSCOPIC GUIDANCE; ENDOBRONCHIAL ULTRASOUND (EBUS) TRANSTRACHEAL AND/OR TRANSBRONCHIAL SAMPLING, ONE/TWO MEDIASTINAL AND/OR HILAR LYMPH NODE STATION/STRUCTURE;  Surgeon: Bjorn Signe Smart, MD;  [facility];  Service: Pulmonary;  Laterality: Bilateral;  . BRONCHOSCOPY N/A 02/26/2022   Procedure: BRONCHOSCOPY, RIGID OR FLEXIBLE, INCLUDING FLUOROSCOPIC GUIDANCE, WHEN PERFORMED; WITH BRONCHIAL ALVEOLAR LAVAGE;  Surgeon: Bjorn Signe Smart, MD;  [facility];  Service: Pulmonary;  Laterality: N/A;  . BRONCHOSCOPY W/ASPIRATION FOREIGN BODY FLEXIBLE N/A 02/26/2022   Procedure: BRONCHOSCOPY, FLEXIBLE, INCLUDING FLUOROSCOPIC GUIDANCE, WHEN PERFORMED; WITH THERAPEUTIC ASPIRATION OF TRACHEOBRONCHIAL TREE, INITIAL (EG, DRAINAGE OF LUNG ABSCESS);  Surgeon: Bjorn Signe Smart, MD;  [facility];  Service: Pulmonary;  Laterality: N/A;  . ARTHROSCOPIC ROTATOR CUFF REPAIR Left   . COLONOSCOPY     FAMILY HISTORY: Family History  Problem Relation Name Age of Onset  . Stroke Mother    . Myocardial Infarction (Heart attack) Father    . Stroke Sister    . Stroke Sister    . Stroke Sister    . Cancer Child    . Anesthesia problems Neg Hx     SOCIAL HISTORY: Social History   Socioeconomic History  . Marital status: Married  . Number of children: 1  Occupational History  . Occupation: Retired Personnel officer at the [employer]  . Smoking status: Former    Current packs/day: 0.00    Average packs/day: 1 pack/day for 40.0  years (40.0 ttl pk-yrs)    Types: Cigarettes    Start date: 08/1981    Quit date: 08/2021    Years since quitting: 1.7  . Smokeless tobacco: Never  . Tobacco comments:    11/12/2021: No smoking, using nicorette lozenges    12/17/2021 No longer using Nicorette lozenges  Vaping Use  . Vaping status: Never Used  Substance and Sexual Activity  . Alcohol use: Not Currently  . Drug use: Never  . Sexual activity: Defer   Social Drivers of Health   Financial Resource Strain: Low Risk  (04/01/2023)   Received from [facility]   Overall Financial Resource Strain (CARDIA)   . Difficulty of Paying Living Expenses: Not hard at all  Food Insecurity: No Food Insecurity (04/01/2023)   Received from [facility]   Hunger Vital Sign   . Worried About Programme researcher, broadcasting/film/video in the Last Year: Never true   . Ran Out of Food in the Last Year: Never true  Transportation Needs: No Transportation Needs (04/01/2023)   Received from [facility] - Transportation   . Lack of Transportation (Medical): No   . Lack of Transportation (Non-Medical): No  Physical Activity:  Insufficiently Active (04/01/2023)   Received from [facility]   Exercise Vital Sign   . Days of Exercise per Week: 3 days   . Minutes of Exercise per Session: 30 min  Stress: No Stress Concern Present (04/01/2023)   Received from [facility] - Occupational Stress Questionnaire   . Feeling of Stress : Not at all  Social Connections: Socially Integrated (04/01/2023)   Received from [facility]   Social Connection and Isolation Panel [NHANES]   . Frequency of Communication with Friends and Family: More than three times a week   . Frequency of Social Gatherings with Friends and Family: More than three times a week   . Attends Religious Services: More than 4 times per year   . Active Member of Clubs or Organizations: Yes   . Attends Banker Meetings: 1 to 4 times per year   . Marital  Status: Married  Housing Stability: Low Risk  (04/01/2023)   Received from [facility] Stability Vital Sign   . Unable to Pay for Housing in the Last Year: No   . Number of Times Moved in the Last Year: 0   . Homeless in the Last Year: No   ALLERGY: Allergies  Allergen Reactions  . Bromocriptine Nausea  . Hydrochlorothiazide  Other (See Comments)    Pancreatitis   . Latex Rash  . Amlodipine  Swelling   CURRENT MEDS: Current Outpatient Medications  Medication Sig Dispense Refill  . acetaminophen  (TYLENOL ) 500 mg capsule Take 1,000 mg by mouth every 8 (eight) hours as needed    . ascorbic acid, vitamin C, (VITAMIN C) 500 MG tablet Take 500 mg by mouth once daily    . aspirin  81 MG EC tablet Take 81 mg by mouth at bedtime    . Bacillus coagulans-inulin (PROBIOTIC FORMULA, INULIN,) 1 billion-250 cell-mg Cap as directed    . cholecalciferol (VITAMIN D3) 2,000 unit tablet Take 2,000 Units by mouth 2 (two) times daily    . coenzyme Q10-vitamin E 100-5 mg-unit Cap Take by mouth every morning before breakfast    . Herbal Supplement Take 1,200 mg by mouth once daily Herbal Name: Flax seed Oil    . hydrALAZINE  (APRESOLINE ) 25 MG tablet Take 25 mg by mouth 3 (three) times daily    . ibuprofen  (MOTRIN ) 200 MG tablet Take 200 mg by mouth every 6 (six) hours as needed for Pain    . irbesartan  (AVAPRO ) 300 MG tablet Take 300 mg by mouth once daily    . melatonin 3 mg tablet Take 3 tablets (9 mg total) by mouth at bedtime    . menthol (HALLS COUGH) 7.6 mg Take 1 lozenge by mouth 2 (two) times daily as needed for Cough or sore throat    . nitroGLYcerin  (NITROSTAT ) 0.4 MG SL tablet Place under the tongue every 5 (five) minutes as needed    . omeprazole  (PRILOSEC ) 40 MG DR capsule Take 40 mg by mouth once daily    . ondansetron  (ZOFRAN ) 8 MG tablet Take 8 mg by mouth every 12 (twelve) hours as needed (Patient not taking: Reported on 02/20/2022)    . peg 400-propylene glycol (LUBRICANT EYE,  PG-PEG 400,) 0.4-0.3 % drops Place 2 drops into both eyes 4 (four) times daily as needed    . polyethylene glycol (MIRALAX) packet Take 1 packet (17 g total) by mouth 2 (two) times daily Mix in 4-8ounces of fluid prior to taking.    SABRA  rosuvastatin  (CRESTOR ) 10 MG tablet Take 10 mg by mouth at bedtime    . sennosides-docusate (SENOKOT-S) 8.6-50 mg tablet Take 2 tablets by mouth 2 (two) times daily    . sildenafil (REVATIO) 20 mg tablet Take 20 mg by mouth 3 (three) times daily as needed    . tamsulosin  (FLOMAX ) 0.4 mg capsule Take 0.4 mg by mouth once daily    . testosterone  (ANDROGEL ) 1.62 % (20.25 mg/1.25 gram) gel packet Apply 20.25 mg topically once daily    . vitamin E 400 UNIT capsule Take 400 Units by mouth once daily     No current facility-administered medications for this visit.   PHYSICAL EXAM: There were no vitals taken for this visit. There is no height or weight on file to calculate BSA.   ECOG Performance Status: Grade 1 : Restricted in physically strenuous activity but ambulatory and able to carry out work of a light or sedentary nature, e.g., light house work, office work  Deferred televisit  LABS: Lab Results  Component Value Date   WBC 5.3 12/05/2021   NEUTCT 3.3 12/05/2021   HGB 12.6 (L) 12/05/2021   HCT 38.0 (L) 12/05/2021   MCV 100 (H) 12/05/2021   PLT 269 12/05/2021     Chemistry      Component Value Date/Time   NA 135 12/05/2021 1135   K  12/05/2021 1135     Comment:     SPECIMEN GROSSLY HEMOLYZED. UNABLE TO REPORT DUE TO INTERFERENCE CAUSED BY THIS DEGREE OF HEMOLYSIS.   CL 99 12/05/2021 1135   CO2 24 12/05/2021 1135   BUN 15 12/05/2021 1135   CREATININE 1.0 12/05/2021 1135   GLUCOSE 112 12/05/2021 1135      Component Value Date/Time   CALCIUM  9.2 12/05/2021 1135   ALKPHOS 62 12/05/2021 1135   AST  12/05/2021 1135     Comment:     SPECIMEN GROSSLY HEMOLYZED. UNABLE TO REPORT DUE TO INTERFERENCE CAUSED BY THIS DEGREE OF HEMOLYSIS.   ALT   12/05/2021 1135     Comment:     SPECIMEN GROSSLY HEMOLYZED. UNABLE TO REPORT DUE TO INTERFERENCE CAUSED BY THIS DEGREE OF HEMOLYSIS.   TBILI  12/05/2021 1135     Comment:     SPECIMEN GROSSLY HEMOLYZED. UNABLE TO REPORT DUE TO INTERFERENCE CAUSED BY THIS DEGREE OF HEMOLYSIS.     RADIOLOGY: PET/CT personally reviewed and reviewed with the patient  PATHOLOGY: As above COMPLEXITY: ASSESSMENT AND PLAN: [name] 78 y.o. with a diagnosis of non-small cell lung cancer.  1) Non-Small Cell Lung Cancer (Synchronous Primaries- Adenocarcinoma & Squamous Cell Carcinoma) - Found to have bilateral lung nodules on screening CT 05/2021.  RUL Bx with squamous cell caricnoma.  LUL FNA favored adenocarcinoma.  PET 07/2021 with two nodules of RUL, right hilar LN, and LUL nodule.  Brain MRI 07/2021 with no intracranial disease.  Bronch 08/2021 with 11R LN positive for NSCLC (unclear if adeno or squam).  Received neoadjuvant Carbo/Taxol Rue for 3 cycles locally from 09/2021-10/2021.  Underwent VATS RUL Lobectomy with Patrick Harris 11/13/21 that revealed two synchronous carcinomas of the RUL (Adenocarcinoma with 40% residual viable tumor T1aN2 & Squamous Cell Carcinoma with 5% residual viable tumor T1aN0).  14/24 LN were positive for adenocarcinoma.  Post-operative PET 01/18/22 with hypermetabolic precarinal LN.  Biopsy of this LN 02/26/22 revealed poorly differentiated adenocarcinoma.  He is completed definitive chemotherapy with radiation to the recurrent disease last year he did not tolerate Imfinzi  due to abdominal  pain.  He has undergone multiple upper endoscopy procedures without revealing a clear etiology for the abdominal pain.  This continues on today and he has generalized weakness associated.  Dr. Gatha has discussed SBRT type treatment to the oligo persistent disease involving the right side mediastinal lymph node at the level 2 position.  My hope is that this was out of the radiation field  previously and its reason for being positive within a year of treatment.  I fully agree with this approach and if hypofractionated course can be pursued this would be ideal.  If more protracted conventional course is needed then I would recommend concurrent chemotherapy.  Following treatment entering into every 3 months surveillance is very reasonable.  Appreciate our involvement in his care and along with the exceptional care from Dr. Gatha.  We remain here to help moving forward  CT personally reviewed showing small vol response so far in the R paratrach LN following IMRT.  Agree with cont surveillance in 3 mo with Dr. Gatha  This telephone encounter was conducted with the patient's (or proxy's) verbal consent via secure, interactive audio telecommunications while in clinic/office/hospital.  The patient (or proxy) was instructed to have this encounter in a suitably private space and to only have persons present to whom they give permission to participate. In addition, the patient identity was confirmed by use of name plus an additional identifier.  I spent 10 minutes with patient and/or family on this phone visit today.   Reyes HERO. Orval, MD [facility]

## 2023-05-15 DIAGNOSIS — E230 Hypopituitarism: Secondary | ICD-10-CM | POA: Diagnosis not present

## 2023-05-15 DIAGNOSIS — N62 Hypertrophy of breast: Secondary | ICD-10-CM | POA: Diagnosis not present

## 2023-05-15 DIAGNOSIS — E221 Hyperprolactinemia: Secondary | ICD-10-CM | POA: Diagnosis not present

## 2023-05-15 DIAGNOSIS — D352 Benign neoplasm of pituitary gland: Secondary | ICD-10-CM | POA: Diagnosis not present

## 2023-05-22 ENCOUNTER — Ambulatory Visit: Admitting: Pulmonary Disease

## 2023-05-22 ENCOUNTER — Encounter: Payer: Self-pay | Admitting: Pulmonary Disease

## 2023-05-22 VITALS — BP 169/78 | HR 71 | Temp 97.4°F | Ht 71.0 in | Wt 194.0 lb

## 2023-05-22 DIAGNOSIS — J209 Acute bronchitis, unspecified: Secondary | ICD-10-CM

## 2023-05-22 DIAGNOSIS — C3490 Malignant neoplasm of unspecified part of unspecified bronchus or lung: Secondary | ICD-10-CM | POA: Diagnosis not present

## 2023-05-22 DIAGNOSIS — R918 Other nonspecific abnormal finding of lung field: Secondary | ICD-10-CM | POA: Diagnosis not present

## 2023-05-22 DIAGNOSIS — J439 Emphysema, unspecified: Secondary | ICD-10-CM | POA: Diagnosis not present

## 2023-05-22 DIAGNOSIS — E221 Hyperprolactinemia: Secondary | ICD-10-CM

## 2023-05-22 MED ORDER — AZITHROMYCIN 250 MG PO TABS: ORAL_TABLET | ORAL | 0 refills | Status: DC

## 2023-05-22 MED ORDER — PREDNISONE 10 MG PO TABS: 40.0000 mg | ORAL_TABLET | Freq: Every day | ORAL | 0 refills | Status: AC

## 2023-05-22 NOTE — Progress Notes (Signed)
 ADO Patrick Harris    [id number]    June 10, 1945  Primary Care Physician:Burns, Beckey Bourgeois, MD  Referring Physician: [name], MD [address] [place],  [place] [address]  Chief complaint: Acute visit for cough, mucus production  HPI: 78 y.o. who  has a past medical history of Arthritis, CAD (coronary artery disease), Cancer (HCC), CHF (congestive heart failure) (HCC), COPD (chronic obstructive pulmonary disease) (HCC), Elevated glycated hemoglobin, Essential hypertension, GERD (gastroesophageal reflux disease), Heart murmur, History of radiation therapy, History of radiation therapy, Hyperlipidemia, Hypopituitarism (HCC), Lung cancer (HCC), Osteopenia, Pancreatitis, Peyronie disease, Shingles, and Testosterone  deficiency.   Discussed the use of AI scribe software for clinical note transcription with the patient, who gave verbal consent to proceed.  History of Present Illness Patrick Harris is a 78 year old male with lung cancer who presents with a persistent cough and mucus production.  He has been experiencing a persistent cough and mucus production for approximately three months. The mucus is sometimes tan and sometimes green, with a consistency similar to 'a ball of bubble gum' and containing hard pieces. The cough is accompanied by a burning sensation in the airway, which subsides after expectorating the mucus. No increased shortness of breath beyond his baseline is noted.  His medical history includes lung cancer, initially diagnosed in June 2023, with stage 3A adenocarcinoma in the right upper lobe, stage 1A squamous cell carcinoma in the right upper lobe, and stage 1A adenocarcinoma in the left upper lobe. He underwent a right upper lobectomy, followed by neoadjuvant chemotherapy with carboplatin , paclitaxel , and nivolumab , as well as additional radiation therapy. He completed 15 days of radiation for a new tumor in the left upper lobe, which showed some shrinkage on a  recent scan. He is not currently on any cancer treatment.  He has a history of hyperprolactinemia, previously treated with bromocriptine, which was changed to cabergoline due to side effects. He has been on cabergoline for about a week.  He has a history of COPD, although he reports no significant respiratory symptoms prior to the current cough. He smoked a pack a day for 30 years, quitting approximately 20 years ago, with a brief period of occasional smoking until 2023. He denies current use of inhalers.  Pets: He has no pets currently but had a Weimaraner in the past.  Occupation:Worked as an Personnel officer at [employer]. Exposures: He denies exposure to mold, hot tubs, jacuzzis, or feather pillows. He uses a pillow filled with seeds.  No h/o chemo/XRT/amiodarone/macrodantin/MTX  No exposure to asbestos, silica or other organic allergens  Smoking history: 30-pack-year smoker.  Quit in 2023 Travel history:He has lived in [place]  all his life Relevant family history: no family history of lung disease.   Outpatient Encounter Medications as of 05/22/2023  Medication Sig   amitriptyline  (ELAVIL ) 10 MG tablet Take 1 tablet (10 mg total) by mouth at bedtime.   aspirin  EC 81 MG tablet Take 1 tablet (81 mg total) by mouth at bedtime. Okay to restart this medication on 07/03/2021   cabergoline (DOSTINEX) 0.5 MG tablet Take 0.25 mg by mouth 2 (two) times a week.   Cholecalciferol (VITAMIN D3) 2000 UNITS TABS Take 2,000 Units by mouth in the morning and at bedtime.   Coenzyme Q10 (COQ-10) 100 MG CAPS Take 100 mg by mouth in the morning.   finasteride (PROSCAR) 5 MG tablet Take 5 mg by mouth daily.   gabapentin  (NEURONTIN ) 300  MG capsule Take 1 capsule (300 mg total) by mouth 3 (three) times daily.   irbesartan  (AVAPRO ) 75 MG tablet TAKE 1 TABLET BY MOUTH EVERY DAY   nitroGLYCERIN  (NITROSTAT ) 0.4 MG SL tablet Place 1 tablet (0.4 mg total) under the tongue every 5 (five) minutes x 3  doses as needed for chest pain (if no relief after 2nd dose, proceed to ED or call 911).   Omega-3 Fatty Acids (OMEGA-3 FISH OIL PO) Take 1,000 mg by mouth in the morning.   omeprazole  (PRILOSEC ) 40 MG capsule Take 1 capsule (40 mg total) by mouth daily.   polyethylene glycol (MIRALAX / GLYCOLAX) 17 g packet Take 17 g by mouth as needed.   rosuvastatin  (CRESTOR ) 20 MG tablet TAKE HALF A TABLET (10 MG) BY MOUTH ONCE DAILY.   sucralfate  (CARAFATE ) 1 g tablet Take 1 tablet (1 g total) by mouth 4 (four) times daily -  with meals and at bedtime. Crush and dissolve in 10 mL warm water prior to swallowing, take 20 min before meals   tamsulosin  (FLOMAX ) 0.4 MG CAPS capsule Take 0.4 mg by mouth in the morning.   Testosterone  20.25 MG/ACT (1.62%) GEL Apply 2 Pump topically in the morning.   triamcinolone  cream (KENALOG ) 0.1 % Apply 1 Application topically 2 (two) times daily.   vitamin C (ASCORBIC ACID) 500 MG tablet Take 500 mg by mouth in the morning.   Vitamin E (VITAMIN E/D-ALPHA NATURAL) 268 MG (400 UNIT) CAPS Take 1 tablet by mouth daily.   No facility-administered encounter medications on file as of 05/22/2023.   Physical Exam: Blood pressure (!) 169/78, pulse 71, temperature (!) 97.4 F (36.3 C), temperature source Oral, height 5\' 11"  (1.803 m), weight 194 lb (88 kg), SpO2 94%. Gen:      No acute distress HEENT:  EOMI, sclera anicteric Neck:     No masses; no thyromegaly Lungs:    Clear to auscultation bilaterally; normal respiratory effort CV:         Regular rate and rhythm; no murmurs Abd:      + bowel sounds; soft, non-tender; no palpable masses, no distension Ext:    No edema; adequate peripheral perfusion Skin:      Warm and dry; no rash Neuro: alert and oriented x 3 Psych: normal mood and affect  Data Reviewed: Imaging: CT chest 05/01/2023 Advanced emphysematous changes.  Increasing groundglass component in the left upper lobe.  Stable nodules.  Volume loss along the right  hemithorax, postsurgical changes, pleural calcification.  I have reviewed the images personally.  PFTs:  Labs: CBC 04/29/2023-WBC 4.9, eos 1%, absolute eosinophil count 49  Assessment & Plan Acute bronchitis Productive cough with tan and green mucus for three months, occasional airway burning. Likely bronchitis contributing to mucus production. Recent CT scan shows improving ground glass opacities and no significant consolidation. Plan to treat acutely with antibiotics and steroids. - Prescribe Z-Pak and prednisone for acute treatment - Advise use of over-the-counter Mucinex to help clear mucus  Lung cancer Stage 3A adenocarcinoma in the right upper lobe, stage 1A squamous cell carcinoma in the right upper lobe, and stage 1A adenocarcinoma in the left upper lobe. Underwent right upper lobectomy, neoadjuvant chemotherapy with carboplatin , paclitaxel , and nivolumab , and radiation therapy. Recent CT scan shows tumor shrinkage. Completed Imfinzi  treatment.  COPD with emphysema 30-year smoking history, quit 20 years ago. CT scan shows advanced emphysema. No current inhaler use. Symptoms include productive cough, but no significant dyspnea or wheezing. Plan to confirm  COPD diagnosis with lung function tests. Discussed potential need for inhaler based on test results and symptom persistence. - Order lung function test to confirm COPD diagnosis - Prescribe Z-Pak and prednisone for acute treatment - Advise use of over-the-counter Mucinex to help clear mucus - Reassess in 2-3 months to evaluate need for long-term inhaler  Hyperprolactinemia Long-standing benign tumor causing hyperprolactinemia. Recently switched from bromocriptine to cabergoline due to side effects. Cabergoline is expected to reduce prolactin levels. - Continue cabergoline for hyperprolactinemia management  Recommendations: Z-Pak, prednisone PFTs  Phyllis Breeze MD [facility] 05/22/2023, 4:14 PM  CC:  [name], MD

## 2023-05-22 NOTE — Patient Instructions (Signed)
 VISIT SUMMARY:  During today's visit, we discussed your persistent cough and mucus production, which you have been experiencing for the past three months. We reviewed your history of lung cancer, COPD, and hyperprolactinemia, and we have developed a plan to address each of these issues.  YOUR PLAN:  -LUNG CANCER: You have a history of lung cancer with multiple types and stages. You have completed various treatments, including surgery, chemotherapy, and radiation. A follow-up CT scan is scheduled in July to monitor the status of your tumors.  -COPD WITH EMPHYSEMA: Chronic Obstructive Pulmonary Disease (COPD) is a long-term lung condition that makes it hard to breathe. You have a history of smoking, which has contributed to this condition. We will confirm the diagnosis with lung function tests and have prescribed a Z-Pak and prednisone for acute treatment. Over-the-counter Mucinex is recommended to help clear mucus. We will reassess in 2-3 months to determine if you need a long-term inhaler.  -BRONCHITIS: Bronchitis is an inflammation of the bronchial tubes, which can cause a productive cough and mucus. We believe this is contributing to your current symptoms. We have prescribed a Z-Pak and prednisone for acute treatment and recommend using over-the-counter Mucinex to help clear mucus.  -HYPERPROLACTINEMIA: Hyperprolactinemia is a condition where there is an excess of prolactin in the blood, often due to a benign tumor. You have been switched to cabergoline to manage this condition, and it is expected to reduce your prolactin levels.  INSTRUCTIONS:  Please schedule a follow-up CT scan in July to assess the status of your lung tumors. Additionally, we have ordered lung function tests to confirm your COPD diagnosis. We will reassess your need for a long-term inhaler in 2-3 months. Continue taking cabergoline for hyperprolactinemia management and use over-the-counter Mucinex to help clear mucus. If you  have any new or worsening symptoms, please contact our office.

## 2023-06-17 DIAGNOSIS — L7211 Pilar cyst: Secondary | ICD-10-CM | POA: Diagnosis not present

## 2023-06-17 DIAGNOSIS — D0472 Carcinoma in situ of skin of left lower limb, including hip: Secondary | ICD-10-CM | POA: Diagnosis not present

## 2023-06-17 DIAGNOSIS — D225 Melanocytic nevi of trunk: Secondary | ICD-10-CM | POA: Diagnosis not present

## 2023-06-17 DIAGNOSIS — L570 Actinic keratosis: Secondary | ICD-10-CM | POA: Diagnosis not present

## 2023-06-17 DIAGNOSIS — D485 Neoplasm of uncertain behavior of skin: Secondary | ICD-10-CM | POA: Diagnosis not present

## 2023-06-17 DIAGNOSIS — L821 Other seborrheic keratosis: Secondary | ICD-10-CM | POA: Diagnosis not present

## 2023-06-17 DIAGNOSIS — L578 Other skin changes due to chronic exposure to nonionizing radiation: Secondary | ICD-10-CM | POA: Diagnosis not present

## 2023-06-17 DIAGNOSIS — Z85828 Personal history of other malignant neoplasm of skin: Secondary | ICD-10-CM | POA: Diagnosis not present

## 2023-06-17 DIAGNOSIS — L820 Inflamed seborrheic keratosis: Secondary | ICD-10-CM | POA: Diagnosis not present

## 2023-06-23 ENCOUNTER — Other Ambulatory Visit: Payer: Self-pay | Admitting: Family Medicine

## 2023-06-23 DIAGNOSIS — B0222 Postherpetic trigeminal neuralgia: Secondary | ICD-10-CM

## 2023-06-23 DIAGNOSIS — R21 Rash and other nonspecific skin eruption: Secondary | ICD-10-CM

## 2023-06-24 ENCOUNTER — Inpatient Hospital Stay: Payer: Medicare Other | Attending: Internal Medicine

## 2023-06-24 ENCOUNTER — Telehealth: Payer: Self-pay | Admitting: Internal Medicine

## 2023-06-24 DIAGNOSIS — C3412 Malignant neoplasm of upper lobe, left bronchus or lung: Secondary | ICD-10-CM | POA: Insufficient documentation

## 2023-06-24 DIAGNOSIS — B0222 Postherpetic trigeminal neuralgia: Secondary | ICD-10-CM

## 2023-06-24 DIAGNOSIS — C3411 Malignant neoplasm of upper lobe, right bronchus or lung: Secondary | ICD-10-CM | POA: Diagnosis not present

## 2023-06-24 DIAGNOSIS — Z95828 Presence of other vascular implants and grafts: Secondary | ICD-10-CM

## 2023-06-24 DIAGNOSIS — R21 Rash and other nonspecific skin eruption: Secondary | ICD-10-CM

## 2023-06-24 MED ORDER — GABAPENTIN 300 MG PO CAPS: 300.0000 mg | ORAL_CAPSULE | Freq: Three times a day (TID) | ORAL | 3 refills | Status: DC

## 2023-06-24 MED ORDER — SODIUM CHLORIDE 0.9% FLUSH
10.0000 mL | Freq: Once | INTRAVENOUS | Status: AC
  Administered 2023-06-24: 10 mL

## 2023-06-24 MED ORDER — HEPARIN SOD (PORK) LOCK FLUSH 100 UNIT/ML IV SOLN
250.0000 [IU] | Freq: Once | INTRAVENOUS | Status: AC
  Administered 2023-06-24: 250 [IU]

## 2023-06-24 NOTE — Telephone Encounter (Signed)
 Copied from CRM [id number]. Topic: Clinical - Medication Refill >> Jun 24, 2023  8:22 AM Carlatta H wrote: Medication: gabapentin  (NEURONTIN ) 300 MG capsule  Has the patient contacted their pharmacy? No (Agent: If no, request that the patient contact the pharmacy for the refill. If patient does not wish to contact the pharmacy document the reason why and proceed with request.) (Agent: If yes, when and what did the pharmacy advise?)  This is the patient's preferred pharmacy:  [id number] - [place], [place] - [address] AT [facility] [address] [place] [address] Phone: [phone] Fax: [phone]  Is this the correct pharmacy for this prescription? Yes If no, delete pharmacy and type the correct one.   Has the prescription been filled recently? No  Is the patient out of the medication? Yes  Has the patient been seen for an appointment in the last year OR does the patient have an upcoming appointment? No  Can we respond through MyChart? No  Agent: Please be advised that Rx refills may take up to 3 business days. We ask that you follow-up with your pharmacy.

## 2023-06-30 DIAGNOSIS — I739 Peripheral vascular disease, unspecified: Secondary | ICD-10-CM | POA: Diagnosis not present

## 2023-06-30 DIAGNOSIS — M79671 Pain in right foot: Secondary | ICD-10-CM | POA: Diagnosis not present

## 2023-06-30 DIAGNOSIS — M79672 Pain in left foot: Secondary | ICD-10-CM | POA: Diagnosis not present

## 2023-06-30 DIAGNOSIS — L110 Acquired keratosis follicularis: Secondary | ICD-10-CM | POA: Diagnosis not present

## 2023-06-30 DIAGNOSIS — M79675 Pain in left toe(s): Secondary | ICD-10-CM | POA: Diagnosis not present

## 2023-06-30 DIAGNOSIS — M79674 Pain in right toe(s): Secondary | ICD-10-CM | POA: Diagnosis not present

## 2023-06-30 DIAGNOSIS — L609 Nail disorder, unspecified: Secondary | ICD-10-CM | POA: Diagnosis not present

## 2023-07-01 ENCOUNTER — Other Ambulatory Visit: Payer: Self-pay | Admitting: *Deleted

## 2023-07-01 DIAGNOSIS — I739 Peripheral vascular disease, unspecified: Secondary | ICD-10-CM

## 2023-07-02 DIAGNOSIS — D0472 Carcinoma in situ of skin of left lower limb, including hip: Secondary | ICD-10-CM | POA: Diagnosis not present

## 2023-07-02 DIAGNOSIS — D239 Other benign neoplasm of skin, unspecified: Secondary | ICD-10-CM | POA: Diagnosis not present

## 2023-07-14 DIAGNOSIS — D352 Benign neoplasm of pituitary gland: Secondary | ICD-10-CM | POA: Diagnosis not present

## 2023-07-14 DIAGNOSIS — E221 Hyperprolactinemia: Secondary | ICD-10-CM | POA: Diagnosis not present

## 2023-07-14 DIAGNOSIS — E230 Hypopituitarism: Secondary | ICD-10-CM | POA: Diagnosis not present

## 2023-07-15 ENCOUNTER — Ambulatory Visit (HOSPITAL_COMMUNITY)
Admission: RE | Admit: 2023-07-15 | Discharge: 2023-07-15 | Disposition: A | Discharge status: Home / Self Care | Source: Ambulatory Visit | Attending: Physician Assistant | Admitting: Physician Assistant

## 2023-07-15 ENCOUNTER — Ambulatory Visit: Admitting: Physician Assistant

## 2023-07-15 VITALS — BP 152/80 | HR 57 | Temp 98.3°F | Resp 18 | Ht 71.0 in | Wt 201.2 lb

## 2023-07-15 DIAGNOSIS — I739 Peripheral vascular disease, unspecified: Secondary | ICD-10-CM

## 2023-07-15 LAB — VAS US ABI WITH/WO TBI
Left ABI: 0.95
Right ABI: 0.86

## 2023-07-15 NOTE — Progress Notes (Signed)
 Office Note   History of Present Illness   Patrick Harris is a 78 y.o. (05-03-1945) male who presents for surveillance of PAD.  He was first seen by Dr. Magda in 2022 for evaluation of PAD and at that time he had intermittent paresthesias and discomfort in his thighs, not believed to be related to arterial disease.  He had no classic symptoms of claudication or rest pain.  He has been followed yearly since with ABIs.   The patient has been diagnosed with lung cancer since 2023.   He returns today for follow-up.  He says that he is no longer undergoing any chemo or radiation.  He will be getting repeat scans in a few months for his cancer.  He denies any claudication, rest pain, or tissue loss.  He does endorse continued, intermittent burning pain when walking that starts at his hips and goes down his legs.  Current Outpatient Medications  Medication Sig Dispense Refill   amitriptyline  (ELAVIL ) 10 MG tablet Take 1 tablet (10 mg total) by mouth at bedtime. 90 tablet 1   aspirin  EC 81 MG tablet Take 1 tablet (81 mg total) by mouth at bedtime. Okay to restart this medication on 07/03/2021 30 tablet 11   cabergoline (DOSTINEX) 0.5 MG tablet Take 0.25 mg by mouth 2 (two) times a week.     Cholecalciferol (VITAMIN D3) 2000 UNITS TABS Take 2,000 Units by mouth in the morning and at bedtime.     Coenzyme Q10 (COQ-10) 100 MG CAPS Take 100 mg by mouth in the morning.     finasteride (PROSCAR) 5 MG tablet Take 5 mg by mouth daily.     gabapentin  (NEURONTIN ) 300 MG capsule Take 1 capsule (300 mg total) by mouth 3 (three) times daily. 60 capsule 3   irbesartan  (AVAPRO ) 75 MG tablet TAKE 1 TABLET BY MOUTH EVERY DAY 90 tablet 1   nitroGLYCERIN  (NITROSTAT ) 0.4 MG SL tablet Place 1 tablet (0.4 mg total) under the tongue every 5 (five) minutes x 3 doses as needed for chest pain (if no relief after 2nd dose, proceed to ED or call 911). 25 tablet 3   Omega-3 Fatty Acids (OMEGA-3 FISH OIL PO) Take 1,000 mg  by mouth in the morning.     omeprazole  (PRILOSEC ) 40 MG capsule Take 1 capsule (40 mg total) by mouth daily. 90 capsule 3   polyethylene glycol (MIRALAX / GLYCOLAX) 17 g packet Take 17 g by mouth as needed.     rosuvastatin  (CRESTOR ) 20 MG tablet TAKE HALF A TABLET (10 MG) BY MOUTH ONCE DAILY. 45 tablet 3   tamsulosin  (FLOMAX ) 0.4 MG CAPS capsule Take 0.4 mg by mouth in the morning.     Testosterone  20.25 MG/ACT (1.62%) GEL Apply 2 Pump topically in the morning.     vitamin C (ASCORBIC ACID) 500 MG tablet Take 500 mg by mouth in the morning.     Vitamin E (VITAMIN E/D-ALPHA NATURAL) 268 MG (400 UNIT) CAPS Take 1 tablet by mouth daily.     azithromycin  (ZITHROMAX ) 250 MG tablet Take 500 mg on day 1 and then 250 mg/day for the next 4 days 6 tablet 0   sucralfate  (CARAFATE ) 1 g tablet Take 1 tablet (1 g total) by mouth 4 (four) times daily -  with meals and at bedtime. Crush and dissolve in 10 mL warm water prior to swallowing, take 20 min before meals 120 tablet 2   triamcinolone  cream (KENALOG ) 0.1 % Apply 1  Application topically 2 (two) times daily. 30 g 0   No current facility-administered medications for this visit.    REVIEW OF SYSTEMS (negative unless checked):   Cardiac:  []  Chest pain or chest pressure? []  Shortness of breath upon activity? []  Shortness of breath when lying flat? []  Irregular heart rhythm?  Vascular:  []  Pain in calf, thigh, or hip brought on by walking? []  Pain in feet at night that wakes you up from your sleep? []  Blood clot in your veins? []  Leg swelling?  Pulmonary:  []  Oxygen at home? []  Productive cough? []  Wheezing?  Neurologic:  []  Sudden weakness in arms or legs? []  Sudden numbness in arms or legs? []  Sudden onset of difficult speaking or slurred speech? []  Temporary loss of vision in one eye? []  Problems with dizziness?  Gastrointestinal:  []  Blood in stool? []  Vomited blood?  Genitourinary:  []  Burning when urinating? []  Blood in  urine?  Psychiatric:  []  Major depression  Hematologic:  []  Bleeding problems? []  Problems with blood clotting?  Dermatologic:  []  Rashes or ulcers?  Constitutional:  []  Fever or chills?  Ear/Nose/Throat:  []  Change in hearing? []  Nose bleeds? []  Sore throat?  Musculoskeletal:  [x]  Back pain? [x]  Joint pain? []  Muscle pain?   Physical Examination   Vitals:   07/15/23 1038  BP: (!) 152/80  Pulse: (!) 57  Resp: 18  Temp: 98.3 F (36.8 C)  TempSrc: Temporal  SpO2: 93%  Weight: 201 lb 3.2 oz (91.3 kg)  Height: 5' 11 (1.803 m)   Body mass index is 28.06 kg/m.  General:  WDWN in NAD; vital signs documented above Gait: Not observed HENT: WNL, normocephalic Pulmonary: normal non-labored breathing , without rales, rhonchi,  wheezing Cardiac: regular Abdomen: soft, NT, no masses Skin: without rashes Vascular Exam/Pulses: 1+ palpable DP pulses  Extremities: without ischemic changes, without gangrene , without cellulitis; without open wounds;  Musculoskeletal: no muscle wasting or atrophy  Neurologic: A&O X 3;  No focal weakness or paresthesias are detected Psychiatric:  The pt has Normal affect.  Non-Invasive Vascular imaging   ABI (07/15/2023) R:  ABI: 0.86 (0.8),  PT: bi DP: bi TBI:  0.65 L:  ABI: 0.95 (0.82),  PT: bi DP: bi TBI: 0.7   Medical Decision Making   Patrick Harris is a 78 y.o. male who presents for surveillance of PAD  Based on the patient's vascular studies, his ABIs appear increased since his last office visit.  His right ABI is 0.86 and left ABI is 0.95.  He does have continued, intermittent burning pain going from his hips to his lower legs when walking.  This has been present for several years now and not likely related to arterial disease.  His pain sounds neuropathic in nature.  Otherwise he denies any classic symptoms of claudication.  He also denies any rest pain or tissue loss. On exam he has palpable femoral and DP pulses  bilaterally He can follow-up with our office in 1 year with repeat ABIs   Patrick Holster PA-C [facility] Office: [phone]  Clinic MD: Miguel

## 2023-07-16 ENCOUNTER — Telehealth: Payer: Self-pay | Admitting: Pediatrics

## 2023-07-16 DIAGNOSIS — K219 Gastro-esophageal reflux disease without esophagitis: Secondary | ICD-10-CM

## 2023-07-16 MED ORDER — OMEPRAZOLE 40 MG PO CPDR: 40.0000 mg | DELAYED_RELEASE_CAPSULE | Freq: Every day | ORAL | 3 refills | Status: DC

## 2023-07-16 NOTE — Telephone Encounter (Signed)
 PT needs a refill for omeprazole  sent to [facility].

## 2023-07-16 NOTE — Telephone Encounter (Signed)
Refill for Omeprazole sent to pharmacy.

## 2023-07-17 ENCOUNTER — Ambulatory Visit: Admitting: Cardiology

## 2023-07-17 DIAGNOSIS — E230 Hypopituitarism: Secondary | ICD-10-CM | POA: Diagnosis not present

## 2023-07-17 DIAGNOSIS — D7589 Other specified diseases of blood and blood-forming organs: Secondary | ICD-10-CM | POA: Diagnosis not present

## 2023-07-17 DIAGNOSIS — N62 Hypertrophy of breast: Secondary | ICD-10-CM | POA: Diagnosis not present

## 2023-07-17 DIAGNOSIS — D352 Benign neoplasm of pituitary gland: Secondary | ICD-10-CM | POA: Diagnosis not present

## 2023-07-17 DIAGNOSIS — E221 Hyperprolactinemia: Secondary | ICD-10-CM | POA: Diagnosis not present

## 2023-07-21 NOTE — Telephone Encounter (Signed)
 Inbound call from patient states the pharmacy states they still have not received omeprazole  prescription. Please advise.

## 2023-07-22 MED ORDER — OMEPRAZOLE 40 MG PO CPDR: 40.0000 mg | DELAYED_RELEASE_CAPSULE | Freq: Every day | ORAL | 3 refills | Status: AC

## 2023-07-22 NOTE — Telephone Encounter (Signed)
 I spoke to [place] at the pharmacy and he confirmed that the prescription was received and is currently being processed.

## 2023-07-22 NOTE — Addendum Note (Signed)
 Addended by: WILLIEMAE JOLA PARAS on: 07/22/2023 09:45 AM   Modules accepted: Orders

## 2023-07-22 NOTE — Telephone Encounter (Signed)
 I spoke to [name] and I advised him that I resent the prescription and I called the pharmacy and confirmed with Cyndee that they did receive it and it is being processed.

## 2023-07-22 NOTE — Telephone Encounter (Signed)
 [facility] and [facility] advised that they didn't receive the prescription sent on 7/9.  I advised her that I will resend and check back to make sure they got it.

## 2023-07-25 ENCOUNTER — Other Ambulatory Visit: Payer: Self-pay | Admitting: Internal Medicine

## 2023-07-25 ENCOUNTER — Ambulatory Visit (HOSPITAL_COMMUNITY)
Admission: RE | Admit: 2023-07-25 | Discharge: 2023-07-25 | Disposition: A | Discharge status: Home / Self Care | Source: Ambulatory Visit | Attending: Internal Medicine | Admitting: Internal Medicine

## 2023-07-25 ENCOUNTER — Inpatient Hospital Stay: Attending: Internal Medicine

## 2023-07-25 DIAGNOSIS — M5134 Other intervertebral disc degeneration, thoracic region: Secondary | ICD-10-CM | POA: Insufficient documentation

## 2023-07-25 DIAGNOSIS — R053 Chronic cough: Secondary | ICD-10-CM | POA: Diagnosis not present

## 2023-07-25 DIAGNOSIS — M8580 Other specified disorders of bone density and structure, unspecified site: Secondary | ICD-10-CM | POA: Insufficient documentation

## 2023-07-25 DIAGNOSIS — C3490 Malignant neoplasm of unspecified part of unspecified bronchus or lung: Secondary | ICD-10-CM | POA: Insufficient documentation

## 2023-07-25 DIAGNOSIS — Z9221 Personal history of antineoplastic chemotherapy: Secondary | ICD-10-CM | POA: Insufficient documentation

## 2023-07-25 DIAGNOSIS — Z902 Acquired absence of lung [part of]: Secondary | ICD-10-CM | POA: Diagnosis not present

## 2023-07-25 DIAGNOSIS — I700 Atherosclerosis of aorta: Secondary | ICD-10-CM | POA: Insufficient documentation

## 2023-07-25 DIAGNOSIS — Z923 Personal history of irradiation: Secondary | ICD-10-CM | POA: Diagnosis not present

## 2023-07-25 DIAGNOSIS — J439 Emphysema, unspecified: Secondary | ICD-10-CM | POA: Insufficient documentation

## 2023-07-25 DIAGNOSIS — C3411 Malignant neoplasm of upper lobe, right bronchus or lung: Secondary | ICD-10-CM | POA: Insufficient documentation

## 2023-07-25 DIAGNOSIS — Z95828 Presence of other vascular implants and grafts: Secondary | ICD-10-CM

## 2023-07-25 DIAGNOSIS — K4020 Bilateral inguinal hernia, without obstruction or gangrene, not specified as recurrent: Secondary | ICD-10-CM | POA: Insufficient documentation

## 2023-07-25 LAB — CMP (CANCER CENTER ONLY)
ALT: 14 U/L (ref 0–44)
AST: 18 U/L (ref 15–41)
Albumin: 3.9 g/dL (ref 3.5–5.0)
Alkaline Phosphatase: 58 U/L (ref 38–126)
Anion gap: 4 — ABNORMAL LOW (ref 5–15)
BUN: 25 mg/dL — ABNORMAL HIGH (ref 8–23)
CO2: 29 mmol/L (ref 22–32)
Calcium: 8.8 mg/dL — ABNORMAL LOW (ref 8.9–10.3)
Chloride: 105 mmol/L (ref 98–111)
Creatinine: 1 mg/dL (ref 0.61–1.24)
GFR, Estimated: >60 mL/min (ref >60)
Glucose, Bld: 98 mg/dL (ref 70–99)
Potassium: 4.3 mmol/L (ref 3.5–5.1)
Sodium: 138 mmol/L (ref 135–145)
Total Bilirubin: 0.6 mg/dL (ref 0.0–1.2)
Total Protein: 6.7 g/dL (ref 6.5–8.1)

## 2023-07-25 LAB — CBC WITH DIFFERENTIAL (CANCER CENTER ONLY)
Abs Immature Granulocytes: 0.02 K/uL (ref 0.00–0.07)
Basophils Absolute: 0 K/uL (ref 0.0–0.1)
Basophils Relative: 1 %
Eosinophils Absolute: 0.1 K/uL (ref 0.0–0.5)
Eosinophils Relative: 2 %
HCT: 40.7 % (ref 39.0–52.0)
Hemoglobin: 13.9 g/dL (ref 13.0–17.0)
Immature Granulocytes: 1 %
Lymphocytes Relative: 18 %
Lymphs Abs: 0.8 K/uL (ref 0.7–4.0)
MCH: 32.7 pg (ref 26.0–34.0)
MCHC: 34.2 g/dL (ref 30.0–36.0)
MCV: 95.8 fL (ref 80.0–100.0)
Monocytes Absolute: 0.7 K/uL (ref 0.1–1.0)
Monocytes Relative: 16 %
Neutro Abs: 2.6 K/uL (ref 1.7–7.7)
Neutrophils Relative %: 62 %
Platelet Count: 154 K/uL (ref 150–400)
RBC: 4.25 MIL/uL (ref 4.22–5.81)
RDW: 12.9 % (ref 11.5–15.5)
WBC Count: 4.1 K/uL (ref 4.0–10.5)
nRBC: 0 % (ref 0.0–0.2)

## 2023-07-25 MED ORDER — IOHEXOL 300 MG/ML  SOLN
100.0000 mL | Freq: Once | INTRAMUSCULAR | Status: AC | PRN
  Administered 2023-07-25: 100 mL via INTRAVENOUS

## 2023-07-25 MED ORDER — HEPARIN SOD (PORK) LOCK FLUSH 100 UNIT/ML IV SOLN
INTRAVENOUS | Status: AC
  Filled 2023-07-25: qty 5

## 2023-07-25 MED ORDER — SODIUM CHLORIDE 0.9% FLUSH
10.0000 mL | Freq: Once | INTRAVENOUS | Status: AC
Start: 2023-07-25 — End: 2023-07-25
  Administered 2023-07-25: 10 mL

## 2023-07-25 MED ORDER — HEPARIN SOD (PORK) LOCK FLUSH 100 UNIT/ML IV SOLN
500.0000 [IU] | Freq: Once | INTRAVENOUS | Status: AC
  Administered 2023-07-25: 500 [IU] via INTRAVENOUS

## 2023-07-31 ENCOUNTER — Inpatient Hospital Stay: Admitting: Internal Medicine

## 2023-07-31 VITALS — BP 123/71 | HR 78 | Temp 97.8°F | Resp 17 | Ht 71.0 in | Wt 199.0 lb

## 2023-07-31 DIAGNOSIS — K4020 Bilateral inguinal hernia, without obstruction or gangrene, not specified as recurrent: Secondary | ICD-10-CM | POA: Diagnosis not present

## 2023-07-31 DIAGNOSIS — Z923 Personal history of irradiation: Secondary | ICD-10-CM | POA: Diagnosis not present

## 2023-07-31 DIAGNOSIS — C3411 Malignant neoplasm of upper lobe, right bronchus or lung: Secondary | ICD-10-CM | POA: Diagnosis not present

## 2023-07-31 DIAGNOSIS — I700 Atherosclerosis of aorta: Secondary | ICD-10-CM | POA: Diagnosis not present

## 2023-07-31 DIAGNOSIS — J439 Emphysema, unspecified: Secondary | ICD-10-CM | POA: Diagnosis not present

## 2023-07-31 DIAGNOSIS — M5134 Other intervertebral disc degeneration, thoracic region: Secondary | ICD-10-CM | POA: Diagnosis not present

## 2023-07-31 DIAGNOSIS — C3490 Malignant neoplasm of unspecified part of unspecified bronchus or lung: Secondary | ICD-10-CM

## 2023-07-31 DIAGNOSIS — M8580 Other specified disorders of bone density and structure, unspecified site: Secondary | ICD-10-CM | POA: Diagnosis not present

## 2023-07-31 DIAGNOSIS — R053 Chronic cough: Secondary | ICD-10-CM | POA: Diagnosis not present

## 2023-07-31 DIAGNOSIS — Z902 Acquired absence of lung [part of]: Secondary | ICD-10-CM | POA: Diagnosis not present

## 2023-07-31 DIAGNOSIS — Z9221 Personal history of antineoplastic chemotherapy: Secondary | ICD-10-CM | POA: Diagnosis not present

## 2023-07-31 NOTE — Progress Notes (Signed)
 [facility] Telephone:([phone]   [phone]  OFFICE PROGRESS NOTE  Geofm Glade PARAS, MD [address] [place] [address]  DIAGNOSIS:  Stage IIIA (T1c, N 2, M0) adenocarcinoma in the right upper lobe as well as stage IA (T1c, N0, M0) squamous cell carcinoma involving the right upper lobe and stage IA (T1b, N0, M0) adenocarcinoma involving the left upper lobe diagnosed in June 2023.  PRIOR THERAPY:  1) status post neoadjuvant systemic chemotherapy with carboplatin , paclitaxel  and nivolumab  for 3 cycles in [place]  followed by right upper lobectomy with lymph node dissection at [facility] under the care of Dr. Valli with the final pathology showing residual disease with synchronous adenocarcinoma as well as squamous cell carcinoma in the right upper lobe and multiple mediastinal lymph node involvement performed on November 13, 2021. 2)  evidence for disease recurrence with involvement of 4R lymphadenopathy consistent with metastatic adenocarcinoma on February 26, 2022 biopsy. Molecular studies showed no actionable mutations and PD-L1 expression was 15% in the adenocarcinoma in 2% in the squamous cell carcinoma. 3) Concurrent chemotherapy with carboplatin  for AUC of 5 and Alimta  500 Mg/M2 every 3 weeks for 2 cycles during the course of radiotherapy.  Last dose was giving 04/08/2022. 4) Consolidation treatment with immunotherapy with Imfinzi  1500 Mg IV every 4 weeks.  First dose May 29, 2022 status post 1 cycle.  This treatment was discontinued secondary to intolerance  CURRENT THERAPY: Observation  INTERVAL HISTORY: Patrick Harris 78 y.o. male returns to the clinic today for follow-up visit accompanied by his wife. Discussed the use of AI scribe software for clinical note transcription with the patient, who gave verbal consent to proceed.  History of Present Illness Patrick Harris is a 78 year old male with stage IIIA non-small cell lung  cancer who presents for follow-up of his condition. He is accompanied by his wife, who has been with him for 58 years.  He was diagnosed with stage IIIA non-small cell lung cancer, squamous cell carcinoma, in June 2023 and underwent concurrent chemoradiation with carboplatin . A recent CT scan of the chest, abdomen, and pelvis was performed for restaging of his disease.  He experiences weakness and a burning sensation in his legs when walking, described as 'burning real bad'. A vascular specialist found no circulatory issues, suggesting a possible nerve problem.  He has persistent back pain for about two months, located in the upper back, associated with a past diagnosis of degenerative disc disease from an X-ray taken ten years ago.  He has a persistent tickling in his throat and coughs up brown, chunky mucus about twice a day. Previously diagnosed with bronchitis by a pulmonologist, the prescribed medication did not alleviate his symptoms, and he continues to cough frequently.  He mentions eating well and weighs 199 pounds.     MEDICAL HISTORY: Past Medical History:  Diagnosis Date   Arthritis    CAD (coronary artery disease)    CABG with LIMA to LAD and RIMA to RCA August 2000 - Dr. Kerrin   Cancer [facility])    basal cell   CHF (congestive heart failure) (HCC)    COPD (chronic obstructive pulmonary disease) (HCC)    Elevated glycated hemoglobin    Essential hypertension    GERD (gastroesophageal reflux disease)    Heart murmur    History of radiation therapy    Right lung(chest) 03/19/22-05/06/22- Dr. Lynwood Nasuti   History of radiation therapy  Right Lung- 02/25/23-03/17/23- Dr. Lynwood Nasuti   Hyperlipidemia    Hypopituitarism [facility])    Lung cancer (HCC)    Osteopenia    Dr Loetta   Pancreatitis    Peyronie disease    Shingles    Testosterone  deficiency    Dr Loetta    ALLERGIES:  is allergic to latex, bromocriptine, zestril  [lisinopril ], diovan  [valsartan ],  hydrochlorothiazide , norvasc  [amlodipine ], and zocor [simvastatin].  MEDICATIONS:  Current Outpatient Medications  Medication Sig Dispense Refill   amitriptyline  (ELAVIL ) 10 MG tablet Take 1 tablet (10 mg total) by mouth at bedtime. 90 tablet 1   aspirin  EC 81 MG tablet Take 1 tablet (81 mg total) by mouth at bedtime. Okay to restart this medication on 07/03/2021 30 tablet 11   azithromycin  (ZITHROMAX ) 250 MG tablet Take 500 mg on day 1 and then 250 mg/day for the next 4 days 6 tablet 0   cabergoline (DOSTINEX) 0.5 MG tablet Take 0.25 mg by mouth 2 (two) times a week.     Cholecalciferol (VITAMIN D3) 2000 UNITS TABS Take 2,000 Units by mouth in the morning and at bedtime.     Coenzyme Q10 (COQ-10) 100 MG CAPS Take 100 mg by mouth in the morning.     finasteride (PROSCAR) 5 MG tablet Take 5 mg by mouth daily.     gabapentin  (NEURONTIN ) 300 MG capsule Take 1 capsule (300 mg total) by mouth 3 (three) times daily. 60 capsule 3   irbesartan  (AVAPRO ) 75 MG tablet TAKE 1 TABLET BY MOUTH EVERY DAY 90 tablet 1   nitroGLYCERIN  (NITROSTAT ) 0.4 MG SL tablet Place 1 tablet (0.4 mg total) under the tongue every 5 (five) minutes x 3 doses as needed for chest pain (if no relief after 2nd dose, proceed to ED or call 911). 25 tablet 3   Omega-3 Fatty Acids (OMEGA-3 FISH OIL PO) Take 1,000 mg by mouth in the morning.     omeprazole  (PRILOSEC ) 40 MG capsule Take 1 capsule (40 mg total) by mouth daily. 90 capsule 3   polyethylene glycol (MIRALAX / GLYCOLAX) 17 g packet Take 17 g by mouth as needed.     rosuvastatin  (CRESTOR ) 20 MG tablet TAKE HALF A TABLET (10 MG) BY MOUTH ONCE DAILY. 45 tablet 3   sucralfate  (CARAFATE ) 1 g tablet Take 1 tablet (1 g total) by mouth 4 (four) times daily -  with meals and at bedtime. Crush and dissolve in 10 mL warm water prior to swallowing, take 20 min before meals 120 tablet 2   tamsulosin  (FLOMAX ) 0.4 MG CAPS capsule Take 0.4 mg by mouth in the morning.     Testosterone  20.25  MG/ACT (1.62%) GEL Apply 2 Pump topically in the morning.     triamcinolone  cream (KENALOG ) 0.1 % Apply 1 Application topically 2 (two) times daily. 30 g 0   vitamin C (ASCORBIC ACID) 500 MG tablet Take 500 mg by mouth in the morning.     Vitamin E (VITAMIN E/D-ALPHA NATURAL) 268 MG (400 UNIT) CAPS Take 1 tablet by mouth daily.     No current facility-administered medications for this visit.    SURGICAL HISTORY:  Past Surgical History:  Procedure Laterality Date   Bone spur  2006   5th toe bilaterally   BRONCHIAL BIOPSY  07/02/2021   Procedure: BRONCHIAL BIOPSIES;  Surgeon: Shelah Lamar RAMAN, MD;  Location: [facility] ENDOSCOPY;  Service: Pulmonary;;   BRONCHIAL BRUSHINGS  07/02/2021   Procedure: BRONCHIAL BRUSHINGS;  Surgeon: Shelah Lamar RAMAN, MD;  Location: [facility] ENDOSCOPY;  Service: Pulmonary;;   BRONCHIAL NEEDLE ASPIRATION BIOPSY  07/02/2021   Procedure: BRONCHIAL NEEDLE ASPIRATION BIOPSIES;  Surgeon: Shelah Lamar RAMAN, MD;  Location: [facility] ENDOSCOPY;  Service: Pulmonary;;   COLONOSCOPY  2007   negative; Dr Jakie   CORONARY ARTERY BYPASS GRAFT  2001   2 vessels   FIDUCIAL MARKER PLACEMENT  07/02/2021   Procedure: FIDUCIAL MARKER PLACEMENT;  Surgeon: Shelah Lamar RAMAN, MD;  Location: [facility] ENDOSCOPY;  Service: Pulmonary;;   fracture heel  1967   Bil/ due to fall off ladder   Fractured calcaneus  1969   bilaterally w/ ankle fusion   HERNIA REPAIR  1984   right inguinal hernia   KNEE SURGERY Left    LOBECTOMY Right 11/13/2021   MENISECTOMY  2007   R medial, Dr. Barbarann   PORTA CATH INSERTION     SHOULDER SURGERY  1994   left   UPPER GASTROINTESTINAL ENDOSCOPY  2007   GERD, Dr Jakie   VASECTOMY     VIDEO BRONCHOSCOPY WITH RADIAL ENDOBRONCHIAL ULTRASOUND  07/02/2021   Procedure: VIDEO BRONCHOSCOPY WITH RADIAL ENDOBRONCHIAL ULTRASOUND;  Surgeon: Shelah Lamar RAMAN, MD;  Location: [facility] ENDOSCOPY;  Service: Pulmonary;;    REVIEW OF SYSTEMS:  Constitutional: positive for fatigue Eyes:  negative Ears, nose, mouth, throat, and face: negative Respiratory: positive for cough Cardiovascular: negative Gastrointestinal: negative Genitourinary:negative Integument/breast: negative Hematologic/lymphatic: negative Musculoskeletal:negative Neurological: negative Behavioral/Psych: negative Endocrine: negative Allergic/Immunologic: negative   PHYSICAL EXAMINATION: General appearance: alert, cooperative, fatigued, and no distress Head: Normocephalic, without obvious abnormality, atraumatic Neck: no adenopathy, no JVD, supple, symmetrical, trachea midline, and thyroid  not enlarged, symmetric, no tenderness/mass/nodules Lymph nodes: Cervical, supraclavicular, and axillary nodes normal. Resp: clear to auscultation bilaterally Back: symmetric, no curvature. ROM normal. No CVA tenderness. Cardio: regular rate and rhythm, S1, S2 normal, no murmur, click, rub or gallop GI: soft, non-tender; bowel sounds normal; no masses,  no organomegaly Extremities: extremities normal, atraumatic, no cyanosis or edema Neurologic: Alert and oriented X 3, normal strength and tone. Normal symmetric reflexes. Normal coordination and gait  ECOG PERFORMANCE STATUS: 1 - Symptomatic but completely ambulatory  Blood pressure 123/71, pulse 78, temperature 97.8 F (36.6 C), temperature source Temporal, resp. rate 17, height 5' 11 (1.803 m), weight 199 lb (90.3 kg), SpO2 98%.  LABORATORY DATA: Lab Results  Component Value Date   WBC 4.1 07/25/2023   HGB 13.9 07/25/2023   HCT 40.7 07/25/2023   MCV 95.8 07/25/2023   PLT 154 07/25/2023      Chemistry      Component Value Date/Time   NA 138 07/25/2023 0853   K 4.3 07/25/2023 0853   CL 105 07/25/2023 0853   CO2 29 07/25/2023 0853   BUN 25 (H) 07/25/2023 0853   CREATININE 1.00 07/25/2023 0853   CREATININE 1.07 08/25/2019 0847      Component Value Date/Time   CALCIUM  8.8 (L) 07/25/2023 0853   ALKPHOS 58 07/25/2023 0853   AST 18 07/25/2023 0853    ALT 14 07/25/2023 0853   BILITOT 0.6 07/25/2023 0853       RADIOGRAPHIC STUDIES: CT CHEST ABDOMEN PELVIS W CONTRAST Result Date: 07/30/2023 CLINICAL DATA:  Lung cancer staging. EXAM: CT CHEST, ABDOMEN, AND PELVIS WITH CONTRAST TECHNIQUE: Multidetector CT imaging of the chest, abdomen and pelvis was performed following the standard protocol during bolus administration of intravenous contrast. RADIATION DOSE REDUCTION: This exam was performed according to the departmental dose-optimization program which includes automated exposure control, adjustment of the mA  and/or kV according to patient size and/or use of iterative reconstruction technique. CONTRAST:  OMNIPAQUE  IOHEXOL  300 MG/ML  SOLN COMPARISON:  CT dated 04/29/2023. FINDINGS: CT CHEST FINDINGS Cardiovascular: There is no cardiomegaly or pericardial effusion. There is coronary vascular calcification and postsurgical changes of CABG. Mild atherosclerotic calcification of the thoracic aorta. No aneurysmal dilatation or dissection. The origins of the great vessels of the aortic arch and the central pulmonary arteries are patent. Right-sided Port-A-Cath with tip at the cavoatrial junction. Mediastinum/Nodes: No hilar or mediastinal adenopathy. Similar or slightly decreased in the size of right thoracic inlet lymph nodes. The esophagus is grossly unremarkable. No mediastinal fluid collection. Lungs/Pleura: Postsurgical changes of the right upper lobectomy with right apical scarring and associated esophageal traction. There is background of emphysema. No consolidative changes. There is no pleural effusion pneumothorax. The central airways remain patent. Musculoskeletal: Median sternotomy wires. No acute osseous pathology. CT ABDOMEN PELVIS FINDINGS No intra-abdominal free air or free fluid. Hepatobiliary: The liver is unremarkable. No biliary dilatation. The gallbladder is unremarkable Pancreas: Unremarkable. No pancreatic ductal dilatation or surrounding  inflammatory changes. Spleen: Normal in size without focal abnormality. Adrenals/Urinary Tract: The adrenal glands unremarkable. There is no hydronephrosis on either side. There is symmetric enhancement and excretion of contrast by both kidneys. The visualized ureters and urinary bladder appear unremarkable. Stomach/Bowel: Moderate stool throughout the colon. There is no bowel obstruction or active inflammation. The appendix is normal. Vascular/Lymphatic: Advanced calcified and noncalcified plaque of the abdominal aorta. The IVC is unremarkable. No portal venous gas. There is no adenopathy. Reproductive: Mildly enlarged prostate gland. Other: Small fat containing bilateral inguinal hernias. Musculoskeletal: Osteopenia with degenerative changes. No acute osseous pathology. IMPRESSION: 1. Postsurgical changes of the right upper lobectomy. 2. Similar or slightly decrease in the size of the right thoracic inlet lymph nodes. No evidence of recurrent or metastatic disease. 3. No acute intra-abdominal or pelvic pathology. 4. Aortic Atherosclerosis (ICD10-I70.0) and Emphysema (ICD10-J43.9). Electronically Signed   By: Vanetta Chou M.D.   On: 07/30/2023 17:33   VAS US  ABI WITH/WO TBI Result Date: 07/15/2023  LOWER EXTREMITY DOPPLER STUDY Patient Name:  Patrick Harris  Date of Exam:   07/15/2023 Medical Rec #: [id number]          Accession #:    [id number] Date of Birth: 1945/06/27           Patient Gender: M Patient Age:   27 years Exam Location:  [address] Procedure:      VAS US  ABI WITH/WO TBI Referring Phys: DONNICE SENDER --------------------------------------------------------------------------------  Indications: Peripheral artery disease. High Risk Factors: Hypertension, hyperlipidemia, past history of smoking,                    coronary artery disease. Other Factors: C/o burning discomfort from the thighs down if he walks very                far.  Comparison Study: 03/05/22 (see table) Performing  Technologist: Eleanor Blackwood BS, RVT, RDCS  Examination Guidelines: A complete evaluation includes at minimum, Doppler waveform signals and systolic blood pressure reading at the level of bilateral brachial, anterior tibial, and posterior tibial arteries, when vessel segments are accessible. Bilateral testing is considered an integral part of a complete examination. Photoelectric Plethysmograph (PPG) waveforms and toe systolic pressure readings are included as required and additional duplex testing as needed. Limited examinations for reoccurring indications may be performed as noted.  ABI Findings: +---------+------------------+-----+--------+--------+ Right  Rt Pressure (mmHg)IndexWaveformComment  +---------+------------------+-----+--------+--------+ Brachial 127                                     +---------+------------------+-----+--------+--------+ ATA      108               0.84 biphasic         +---------+------------------+-----+--------+--------+ PTA      111               0.86 biphasic         +---------+------------------+-----+--------+--------+ Great Toe84                0.65 Abnormal         +---------+------------------+-----+--------+--------+ +---------+------------------+-----+--------+-------+ Left     Lt Pressure (mmHg)IndexWaveformComment +---------+------------------+-----+--------+-------+ Brachial 129                                    +---------+------------------+-----+--------+-------+ ATA      123               0.95 biphasic        +---------+------------------+-----+--------+-------+ PTA      120               0.93 biphasic        +---------+------------------+-----+--------+-------+ Great Toe90                0.70 Normal          +---------+------------------+-----+--------+-------+ +-------+-----------+-----------+------------+------------+ ABI/TBIToday's ABIToday's TBIPrevious ABIPrevious TBI  +-------+-----------+-----------+------------+------------+ Right  .86        .65        .80         .65          +-------+-----------+-----------+------------+------------+ Left   .95        .70        .92         .66          +-------+-----------+-----------+------------+------------+  Compared to prior study on 03/05/22.  *See table(s) above for measurements and observations.  Electronically signed by Lonni Gaskins MD on 07/15/2023 at 11:01:08 AM.    Final     ASSESSMENT AND PLAN: This is a very pleasant 78 years old white male with Stage IIIA (T1c, N 2, M0) adenocarcinoma in the right upper lobe as well as stage IA (T1c, N0, M0) squamous cell carcinoma involving the right upper lobe and stage IA (T1b, N0, M0) adenocarcinoma involving the left upper lobe diagnosed in June 2023. The patient is status post the following treatment: 1) status post neoadjuvant systemic chemotherapy with carboplatin , paclitaxel  and nivolumab  for 3 cycles in [place]   followed by right upper lobectomy with lymph node dissection at [facility] under the care of Dr. Valli with the final pathology showing residual disease with synchronous adenocarcinoma as well as squamous cell carcinoma in the right upper lobe and multiple mediastinal lymph node involvement performed on November 13, 2021. 2)  evidence for disease recurrence with involvement of 4R lymphadenopathy consistent with metastatic adenocarcinoma on February 26, 2022 biopsy. Molecular studies showed no actionable mutations and PD-L1 expression was 15% in the adenocarcinoma in 2% in the squamous cell carcinoma. 3) Concurrent chemotherapy with carboplatin  for AUC of 5 and Alimta  500 Mg/M2 every 3 weeks for 2 cycles during the course of radiotherapy.  Last dose was giving 04/08/2022.  He tolerated his treatment well except for the chemotherapy-induced odynophagia in addition to fatigue and sore throat. He started treatment with consolidation  immunotherapy with Imfinzi  1500 Mg IV every 4 weeks status post 1 cycle.  This was discontinued secondary to intolerance and severe diarrhea managed by his gastroenterologist Dr. Aneita. The patient is currently on observation. Assessment and Plan Assessment & Plan Stage 3A non-small cell lung cancer, squamous cell carcinoma Diagnosed in June 2023, previously treated with concurrent chemoradiation with carboplatin  and immunotherapy. Current imaging shows stable disease with no progression or metastasis. Residual scarring from radiation in the right upper lobe. No new lesions identified. - Continue regular monitoring with imaging studies - Schedule follow-up appointment in six months - Instruct to call if any new symptoms or concerns arise  Chronic cough with sputum production Chronic cough with brown, chunky sputum occurring twice daily. Previously evaluated by a pulmonologist and treated for bronchitis without improvement.  Degenerative disc disease Reports back pain for the past two months, possibly related to degenerative disc disease. Previous imaging showed degenerative changes but no acute process. The patient was advised to call immediately if he has any concerning symptoms in the interval.  The patient voices understanding of current disease status and treatment options and is in agreement with the current care plan.  All questions were answered. The patient knows to call the clinic with any problems, questions or concerns. We can certainly see the patient much sooner if necessary.  The total time spent in the appointment was 30 minutes.  Disclaimer: This note was dictated with voice recognition software. Similar sounding words can inadvertently be transcribed and may not be corrected upon review.

## 2023-08-05 ENCOUNTER — Ambulatory Visit: Attending: Cardiology | Admitting: Cardiology

## 2023-08-05 ENCOUNTER — Encounter: Payer: Self-pay | Admitting: Cardiology

## 2023-08-05 ENCOUNTER — Telehealth: Payer: Self-pay | Admitting: Cardiology

## 2023-08-05 VITALS — BP 118/68 | HR 76 | Ht 71.0 in | Wt 199.0 lb

## 2023-08-05 DIAGNOSIS — I25119 Atherosclerotic heart disease of native coronary artery with unspecified angina pectoris: Secondary | ICD-10-CM

## 2023-08-05 DIAGNOSIS — E782 Mixed hyperlipidemia: Secondary | ICD-10-CM | POA: Diagnosis not present

## 2023-08-05 DIAGNOSIS — I10 Essential (primary) hypertension: Secondary | ICD-10-CM | POA: Diagnosis not present

## 2023-08-05 DIAGNOSIS — I739 Peripheral vascular disease, unspecified: Secondary | ICD-10-CM | POA: Diagnosis not present

## 2023-08-05 MED ORDER — NITROGLYCERIN 0.4 MG SL SUBL: 0.4000 mg | SUBLINGUAL_TABLET | SUBLINGUAL | 3 refills | Status: AC | PRN

## 2023-08-05 MED ORDER — ROSUVASTATIN CALCIUM 20 MG PO TABS
20.0000 mg | ORAL_TABLET | Freq: Every day | ORAL | 3 refills | Status: DC
Start: 2023-08-05 — End: 2023-09-09

## 2023-08-05 NOTE — Progress Notes (Signed)
    Cardiology Office Note  Date: 08/05/2023   ID: DEONDRAE MCGRAIL, DOB 02-Jul-1945, MRN [id number]  History of Present Illness: Patrick Harris is a 78 y.o. male last seen in January 2024.  He is here for a follow-up visit.  He does not report any angina or interval nitroglycerin  use, stable NYHA class II dyspnea.  Remains functional with ADLs including yard work.  We went over his reviewed his medications.  He reports compliance with cardiovascular regimen.  We did discuss uptitrating his Crestor  based on last LDL of 81 in February.  I reviewed the remainder of his lab work as well.  He had a recent visit with [facility], I reviewed the note.  I reviewed his ECG today which shows sinus rhythm with prolonged PR interval and nonspecific ST changes.  Physical Exam: VS:  BP 118/68 (BP Location: Left Arm, Cuff Size: Normal)   Pulse 76   Ht 5' 11 (1.803 m)   Wt 199 lb (90.3 kg)   BMI 27.75 kg/m , BMI Body mass index is 27.75 kg/m.  Wt Readings from Last 3 Encounters:  08/05/23 199 lb (90.3 kg)  07/31/23 199 lb (90.3 kg)  07/15/23 201 lb 3.2 oz (91.3 kg)    General: Patient appears comfortable at rest. HEENT: Conjunctiva and lids normal. Neck: Supple, no elevated JVP or carotid bruits. Lungs: Clear to auscultation, nonlabored breathing at rest. Cardiac: Regular rate and rhythm, no S3, 1/6 systolic murmur. Extremities: No pitting edema, no ulcerations.  ECG:  An ECG dated 05/16/2022 was personally reviewed today and demonstrated:  Sinus rhythm with prolonged PR interval.  Labwork: 02/06/2023: TSH 1.849 07/25/2023: ALT 14; AST 18; BUN 25; Creatinine 1.00; Hemoglobin 13.9; Platelet Count 154; Potassium 4.3; Sodium 138     Component Value Date/Time   CHOL 143 03/06/2023 0834   TRIG 115.0 03/06/2023 0834   HDL 38.90 (L) 03/06/2023 0834   CHOLHDL 4 03/06/2023 0834   VLDL 23.0 03/06/2023 0834   LDLCALC 81 03/06/2023 0834   LDLCALC 71 08/25/2019 0847   Other Studies Reviewed Today:  No  interval cardiac testing for review today.  Assessment and Plan:  1.  Multivessel CAD status post CABG in 2000 with LIMA to LAD and RIMA to RCA.  LVEF 55 to 60% at that time.  He remains clinically stable with no angina or interval nitroglycerin  use.  ECG reviewed.  Continue aspirin  81 mg daily, statin, and as needed nitroglycerin .  2.  Mixed hyperlipidemia.  LDL 81 in February.  Increase Crestor  to 20 mg daily (he had been taking 10 mg daily).  3.  Primary hypertension.  Continue present regimen including Avapro  75 mg daily.  4.  PAD followed by VVS.  Recent ABIs reviewed.  Disposition:  Follow up 1 year.  Signed, Jayson JUDITHANN Sierras, M.D., F.A.C.C. [facility]

## 2023-08-05 NOTE — Patient Instructions (Signed)
 Medication Instructions:   INCREASE Crestor  to 20 mg daily  Labwork: None today  Testing/Procedures: None today  Follow-Up: 1 year  Any Other Special Instructions Will Be Listed Below (If Applicable).  If you need a refill on your cardiac medications before your next appointment, please call your pharmacy.

## 2023-08-05 NOTE — Telephone Encounter (Signed)
*  STAT* If patient is at the pharmacy, call can be transferred to refill team.   1. Which medications need to be refilled? (please list name of each medication and dose if known)   nitroGLYCERIN  (NITROSTAT ) 0.4 MG SL tablet   2. Would you like to learn more about the convenience, safety, & potential cost savings by using the [name]?   3. Are you open to using the [facility] (Type [facility]. ).  4. Which pharmacy/location (including street and city if local pharmacy) is medication to be sent to?  [id number] - [place], [place] - [address] AT [address]   5. Do they need a 30 day or 90 day supply?   Patient stated he still has some medication but it has expired.

## 2023-08-06 ENCOUNTER — Telehealth: Payer: Self-pay | Admitting: Internal Medicine

## 2023-08-06 NOTE — Telephone Encounter (Signed)
 Scheduled all appointments with the patient.

## 2023-08-14 ENCOUNTER — Other Ambulatory Visit: Payer: Self-pay | Admitting: Pediatrics

## 2023-08-25 ENCOUNTER — Ambulatory Visit (INDEPENDENT_AMBULATORY_CARE_PROVIDER_SITE_OTHER): Admitting: Internal Medicine

## 2023-08-25 ENCOUNTER — Encounter: Payer: Self-pay | Admitting: Internal Medicine

## 2023-08-25 VITALS — BP 126/70 | HR 72 | Temp 98.6°F | Ht 71.0 in | Wt 196.0 lb

## 2023-08-25 DIAGNOSIS — I10 Essential (primary) hypertension: Secondary | ICD-10-CM

## 2023-08-25 DIAGNOSIS — R052 Subacute cough: Secondary | ICD-10-CM | POA: Diagnosis not present

## 2023-08-25 MED ORDER — HYDROCODONE BIT-HOMATROP MBR 5-1.5 MG/5ML PO SOLN: 5.0000 mL | Freq: Three times a day (TID) | ORAL | 0 refills | Status: DC | PRN

## 2023-08-25 MED ORDER — AMOXICILLIN-POT CLAVULANATE 875-125 MG PO TABS: 1.0000 | ORAL_TABLET | Freq: Two times a day (BID) | ORAL | 0 refills | Status: AC

## 2023-08-25 NOTE — Progress Notes (Signed)
 Subjective:    Patient ID: Patrick Harris, male    DOB: 1945-11-17, 78 y.o.   MRN: [id number]      HPI Patrick Harris is here for  Chief Complaint  Patient presents with   Cough    Cough and runny nose that started yesterday     He has been experiencing a cough for a while.  He feels like something stuck in his lower throat.  A couple of months ago he was having similar symptoms and he was prescribed a Z-Pak and prednisone  which did not seem to help much.  He did start inhaling pink salt steam and that seemed to help-his sputum changed from brown to green.  Still has cough with occasional productivity of some discolored mucus.  He does not have a lot of mucus.  It is often very hard to get up the mucus-gets stuck in his throat..  The area feels irritated like there is only something there    Medications and allergies reviewed with patient and updated if appropriate.  Current Outpatient Medications on File Prior to Visit  Medication Sig Dispense Refill   amitriptyline  (ELAVIL ) 10 MG tablet TAKE 1 TABLET BY MOUTH EVERYDAY AT BEDTIME 30 tablet 5   aspirin  EC 81 MG tablet Take 1 tablet (81 mg total) by mouth at bedtime. Okay to restart this medication on 07/03/2021 30 tablet 11   cabergoline (DOSTINEX) 0.5 MG tablet Take 0.25 mg by mouth 2 (two) times a week.     Cholecalciferol (VITAMIN D3) 2000 UNITS TABS Take 2,000 Units by mouth in the morning and at bedtime.     Coenzyme Q10 (COQ-10) 100 MG CAPS Take 100 mg by mouth in the morning.     finasteride (PROSCAR) 5 MG tablet Take 5 mg by mouth daily.     gabapentin  (NEURONTIN ) 300 MG capsule Take 1 capsule (300 mg total) by mouth 3 (three) times daily. 60 capsule 3   irbesartan  (AVAPRO ) 75 MG tablet TAKE 1 TABLET BY MOUTH EVERY DAY 90 tablet 1   nitroGLYCERIN  (NITROSTAT ) 0.4 MG SL tablet Place 1 tablet (0.4 mg total) under the tongue every 5 (five) minutes x 3 doses as needed for chest pain (if no relief after 2nd dose, proceed to ED or  call 911). 25 tablet 3   Omega-3 Fatty Acids (OMEGA-3 FISH OIL PO) Take 1,000 mg by mouth in the morning.     omeprazole  (PRILOSEC ) 40 MG capsule Take 1 capsule (40 mg total) by mouth daily. 90 capsule 3   polyethylene glycol (MIRALAX / GLYCOLAX) 17 g packet Take 17 g by mouth as needed.     rosuvastatin  (CRESTOR ) 20 MG tablet Take 1 tablet (20 mg total) by mouth daily. 90 tablet 3   tamsulosin  (FLOMAX ) 0.4 MG CAPS capsule Take 0.4 mg by mouth in the morning.     Testosterone  20.25 MG/ACT (1.62%) GEL Apply 2 Pump topically in the morning.     vitamin C (ASCORBIC ACID) 500 MG tablet Take 500 mg by mouth in the morning.     Vitamin E (VITAMIN E/D-ALPHA NATURAL) 268 MG (400 UNIT) CAPS Take 1 tablet by mouth daily.     No current facility-administered medications on file prior to visit.    Review of Systems  Constitutional:  Negative for fever.  HENT:  Positive for congestion, rhinorrhea and sore throat. Negative for ear pain and sinus pain.   Respiratory:  Positive for cough (productive  a couple of times a day -  green mucus) and shortness of breath (? little worse SOB). Negative for wheezing.   Neurological:  Negative for dizziness and headaches.       Objective:   Vitals:   08/25/23 1555  BP: 126/70  Pulse: 72  Temp: 98.6 F (37 C)  SpO2: 95%   BP Readings from Last 3 Encounters:  08/25/23 126/70  08/05/23 118/68  07/31/23 123/71   Wt Readings from Last 3 Encounters:  08/25/23 196 lb (88.9 kg)  08/05/23 199 lb (90.3 kg)  07/31/23 199 lb (90.3 kg)   Body mass index is 27.34 kg/m.    Physical Exam Constitutional:      General: He is not in acute distress.    Appearance: Normal appearance. He is not ill-appearing.  HENT:     Head: Normocephalic.     Right Ear: Tympanic membrane, ear canal and external ear normal. There is no impacted cerumen.     Left Ear: Tympanic membrane, ear canal and external ear normal. There is no impacted cerumen.     Mouth/Throat:     Mouth:  Mucous membranes are moist.     Pharynx: No oropharyngeal exudate or posterior oropharyngeal erythema.  Eyes:     Conjunctiva/sclera: Conjunctivae normal.  Cardiovascular:     Rate and Rhythm: Normal rate and regular rhythm.  Pulmonary:     Effort: Pulmonary effort is normal. No respiratory distress.     Breath sounds: Normal breath sounds. No wheezing or rales.  Musculoskeletal:     Cervical back: Neck supple. No tenderness.  Lymphadenopathy:     Cervical: No cervical adenopathy.  Skin:    General: Skin is warm and dry.     Findings: No rash.  Neurological:     Mental Status: He is alert.            Assessment & Plan:    See Problem List for Assessment and Plan of chronic medical problems.

## 2023-08-25 NOTE — Assessment & Plan Note (Signed)
 Chronic BP at home controlled Continue avapro 75 mg daily

## 2023-08-25 NOTE — Patient Instructions (Addendum)
       Medications changes include :   augmentin  twice daily and cough syrup       Return if symptoms worsen or fail to improve.

## 2023-08-25 NOTE — Assessment & Plan Note (Signed)
 Subacute-?  Acute on chronic Having somewhat of a productive cough with discolored mucus ?  Infection or not-given persistence and his medical history concerned about a possible infection Last CT chest did show that his esophagus and trachea were slightly pushed to the right which could be part of what he is experiencing Will start Augmentin  875-125 mg twice daily x 10 days and Hycodan cough syrup Has follow-up with pulmonary next week and depending on how he does with the above and determine if further evaluation or treatment is needed

## 2023-08-26 ENCOUNTER — Ambulatory Visit (INDEPENDENT_AMBULATORY_CARE_PROVIDER_SITE_OTHER): Admitting: Family Medicine

## 2023-08-26 ENCOUNTER — Encounter: Payer: Self-pay | Admitting: Family Medicine

## 2023-08-26 ENCOUNTER — Telehealth: Payer: Self-pay

## 2023-08-26 VITALS — BP 156/74 | HR 73 | Temp 100.6°F | Ht 71.0 in | Wt 198.0 lb

## 2023-08-26 DIAGNOSIS — R509 Fever, unspecified: Secondary | ICD-10-CM | POA: Diagnosis not present

## 2023-08-26 DIAGNOSIS — U071 COVID-19: Secondary | ICD-10-CM | POA: Diagnosis not present

## 2023-08-26 LAB — POC COVID19 BINAXNOW
SARS Coronavirus 2 Ag: NEGATIVE
SARS Coronavirus 2 Ag: POSITIVE — AB

## 2023-08-26 MED ORDER — NIRMATRELVIR/RITONAVIR (PAXLOVID)TABLET: 3.0000 | ORAL_TABLET | Freq: Two times a day (BID) | ORAL | 0 refills | Status: AC

## 2023-08-26 NOTE — Patient Instructions (Addendum)
 I have sent in Paxlovid  for you to take twice a day for 5 days.    I recommend you eat with this medication, as it can upset your stomach if you do not.  This medication can sometimes leave a bad taste in your mouth, it is okay to use mints, gum, to help combat this.   If this occurs, it is temporary and will resolve once the medication course is completed.  There is a minor risk of rebound COVID after taking Paxlovid .  Continue supportive care at home, make sure you are drinking plenty of fluids and getting some rest.  May take ibuprofen  and Tylenol  as needed for fever, aches and pains.  Stay home and away from everyone until you are 24 hours fever free without fever reducing medications.  Follow-up with me if symptoms are persisting over the next week.  Follow-up in the emergency room if you are experiencing shortness of breath, high fever, unremitting cough, severe fatigue, other concerning symptoms.  Do NOT take atorvastatin with Paxlovid 

## 2023-08-26 NOTE — Telephone Encounter (Signed)
 Patient has appointment today

## 2023-08-26 NOTE — Addendum Note (Signed)
 Addended by: CLAUDENE TOBIAS PARAS on: 08/26/2023 08:21 AM   Modules accepted: Orders

## 2023-08-26 NOTE — Progress Notes (Signed)
 Acute Office Visit  Subjective:     Patient ID: Patrick Harris, male    DOB: Dec 11, 1945, 78 y.o.   MRN: [id number]  Chief Complaint  Patient presents with   Covid Positive    Pt stated that he tested at home and it was positive but the test was expired,    HPI  Discussed the use of AI scribe software for clinical note transcription with the patient, who gave verbal consent to proceed.  History of Present Illness Patrick Harris is a 78 year old male who presents with COVID-19 symptoms.  Respiratory symptoms - Cough with green and brown sputum - Tested positive for COVID-19 today at home, had negative test in this office yesterday - Upcoming appointment with pulmonary specialist for further evaluation  Medication management - Currently taking amoxicillin  with clavulanate and hycodan - Delay in obtaining prescribed cough syrup      ROS Per HPI      Objective:    BP (!) 156/74   Pulse 73   Temp (!) 100.6 F (38.1 C)   Ht 5' 11 (1.803 m)   Wt 198 lb (89.8 kg)   SpO2 94%   BMI 27.62 kg/m    Physical Exam Vitals and nursing note reviewed.  Constitutional:      General: He is not in acute distress.    Comments: Appears fatigued  HENT:     Head: Normocephalic and atraumatic.     Right Ear: External ear normal.     Left Ear: External ear normal.     Nose: Nose normal.     Mouth/Throat:     Mouth: Mucous membranes are moist.     Pharynx: Oropharynx is clear.     Comments: Oropharyngeal cobblestoning   Eyes:     Extraocular Movements: Extraocular movements intact.  Cardiovascular:     Rate and Rhythm: Normal rate and regular rhythm.     Pulses: Normal pulses.     Heart sounds: Normal heart sounds.  Pulmonary:     Effort: Pulmonary effort is normal. No respiratory distress.     Breath sounds: Normal breath sounds. No wheezing, rhonchi or rales.     Comments: Moderate cough in office Musculoskeletal:        General: Normal range of motion.      Cervical back: Normal range of motion.     Right lower leg: No edema.     Left lower leg: No edema.  Lymphadenopathy:     Cervical: Cervical adenopathy present.  Skin:    General: Skin is warm and dry.  Neurological:     General: No focal deficit present.     Mental Status: He is alert and oriented to person, place, and time.  Psychiatric:        Mood and Affect: Mood normal.        Behavior: Behavior normal.     Results for orders placed or performed in visit on 08/26/23  POC COVID-19  Result Value Ref Range   SARS Coronavirus 2 Ag Positive (A) Negative        Assessment & Plan:   Assessment and Plan Assessment & Plan COVID-19 infection Acute COVID-19 infection with increased risk for severe illness due to age and multiple comorbidities, including cancer and recent surgeries. - Prescribed Paxlovid , twice daily for five days. - Discussed potential side effects of Paxlovid , including metallic taste and rare rebound COVID-19. - Hold rosuvastatin  during Paxlovid  treatment due to potential drug interactions. -  Advised home isolation for five days starting today. - Advised to contact pulmonary specialist to inform of COVID-19 diagnosis and treatment, and confirm upcoming appointment status.  Chronic cough with sputum production Chronic cough with green and brown sputum production. Amoxicillin -clavulanate prescribed prior to COVID-19 diagnosis. - Continue amoxicillin -clavulanate as prescribed. - Ensure receipt of prescribed cough syrup from pharmacy.     Orders Placed This Encounter  Procedures   POC COVID-19     Meds ordered this encounter  Medications   nirmatrelvir /ritonavir  (PAXLOVID ) 20 x 150 MG & 10 x 100MG  TABS    Sig: Take 3 tablets by mouth 2 (two) times daily for 5 days. (Take nirmatrelvir  150 mg two tablets twice daily for 5 days and ritonavir  100 mg one tablet twice daily for 5 days) Patient GFR is >60    Dispense:  30 tablet    Refill:  0    Return if  symptoms worsen or fail to improve.  Corean LITTIE Ku, FNP

## 2023-08-26 NOTE — Telephone Encounter (Signed)
 Copied from CRM #[id number]. Topic: Clinical - Medical Advice >> Aug 26, 2023  8:38 AM Chasity T wrote: Reason for CRM: Patient is calling in to say that he is running a fever of 101.2 and took an at home covid test and it was positive. Wanted to know if he can get medication and retested.

## 2023-09-01 ENCOUNTER — Ambulatory Visit: Admitting: Pulmonary Disease

## 2023-09-01 ENCOUNTER — Encounter (HOSPITAL_BASED_OUTPATIENT_CLINIC_OR_DEPARTMENT_OTHER)

## 2023-09-01 ENCOUNTER — Encounter

## 2023-09-05 ENCOUNTER — Inpatient Hospital Stay

## 2023-09-08 ENCOUNTER — Encounter: Payer: Self-pay | Admitting: Internal Medicine

## 2023-09-08 NOTE — Progress Notes (Unsigned)
 Subjective:    Patient ID: Patrick Harris, male    DOB: February 27, 1945, 78 y.o.   MRN: [id number]     HPI Patrick Harris is here for a physical exam and his chronic medical problems.  Saw oncology - July - disease stable  - no progression or mets.    Still with chronic cough.   Can hold off on lab.  Medications and allergies reviewed with patient and updated if appropriate.  Current Outpatient Medications on File Prior to Visit  Medication Sig Dispense Refill   amitriptyline  (ELAVIL ) 10 MG tablet TAKE 1 TABLET BY MOUTH EVERYDAY AT BEDTIME 30 tablet 5   aspirin  EC 81 MG tablet Take 1 tablet (81 mg total) by mouth at bedtime. Okay to restart this medication on 07/03/2021 30 tablet 11   cabergoline (DOSTINEX) 0.5 MG tablet Take 0.25 mg by mouth 2 (two) times a week.     Cholecalciferol (VITAMIN D3) 2000 UNITS TABS Take 2,000 Units by mouth in the morning and at bedtime.     Coenzyme Q10 (COQ-10) 100 MG CAPS Take 100 mg by mouth in the morning.     finasteride (PROSCAR) 5 MG tablet Take 5 mg by mouth daily.     gabapentin  (NEURONTIN ) 300 MG capsule Take 1 capsule (300 mg total) by mouth 3 (three) times daily. 60 capsule 3   HYDROcodone  bit-homatropine (HYCODAN) 5-1.5 MG/5ML syrup Take 5 mLs by mouth every 8 (eight) hours as needed for cough. 120 mL 0   irbesartan  (AVAPRO ) 75 MG tablet TAKE 1 TABLET BY MOUTH EVERY DAY 90 tablet 1   nitroGLYCERIN  (NITROSTAT ) 0.4 MG SL tablet Place 1 tablet (0.4 mg total) under the tongue every 5 (five) minutes x 3 doses as needed for chest pain (if no relief after 2nd dose, proceed to ED or call 911). 25 tablet 3   Omega-3 Fatty Acids (OMEGA-3 FISH OIL PO) Take 1,000 mg by mouth in the morning.     omeprazole  (PRILOSEC ) 40 MG capsule Take 1 capsule (40 mg total) by mouth daily. 90 capsule 3   polyethylene glycol (MIRALAX / GLYCOLAX) 17 g packet Take 17 g by mouth as needed.     rosuvastatin  (CRESTOR ) 20 MG tablet Take 1 tablet (20 mg total) by mouth daily. 90  tablet 3   tamsulosin  (FLOMAX ) 0.4 MG CAPS capsule Take 0.4 mg by mouth in the morning.     Testosterone  20.25 MG/ACT (1.62%) GEL Apply 2 Pump topically in the morning.     vitamin C (ASCORBIC ACID) 500 MG tablet Take 500 mg by mouth in the morning.     Vitamin E (VITAMIN E/D-ALPHA NATURAL) 268 MG (400 UNIT) CAPS Take 1 tablet by mouth daily.     No current facility-administered medications on file prior to visit.    Review of Systems     Objective:  There were no vitals filed for this visit. There were no vitals filed for this visit. There is no height or weight on file to calculate BMI.  BP Readings from Last 3 Encounters:  08/26/23 (!) 156/74  08/25/23 126/70  08/05/23 118/68    Wt Readings from Last 3 Encounters:  08/26/23 198 lb (89.8 kg)  08/25/23 196 lb (88.9 kg)  08/05/23 199 lb (90.3 kg)      Physical Exam Constitutional: He appears well-developed and well-nourished. No distress.  HENT:  Head: Normocephalic and atraumatic.  Right Ear: External ear normal.  Left Ear: External ear normal.  Normal ear canals  and TM b/l  Mouth/Throat: Oropharynx is clear and moist. Eyes: Conjunctivae and EOM are normal.  Neck: Neck supple. No tracheal deviation present. No thyromegaly present.  No carotid bruit  Cardiovascular: Normal rate, regular rhythm, normal heart sounds and intact distal pulses.   No murmur heard.  No lower extremity edema. Pulmonary/Chest: Effort normal and breath sounds normal. No respiratory distress. He has no wheezes. He has no rales.  Abdominal: Soft. He exhibits no distension. There is no tenderness.  Genitourinary: deferred  Lymphadenopathy:   He has no cervical adenopathy.  Skin: Skin is warm and dry. He is not diaphoretic.  Psychiatric: He has a normal mood and affect. His behavior is normal.         Assessment & Plan:   Physical exam: Screening blood work  ordered Exercise    Weight   Substance abuse   none   Reviewed recommended  immunizations.   Health Maintenance  Topic Date Due   Zoster Vaccines- Shingrix (1 of 2) Never done   INFLUENZA VACCINE  08/08/2023   COVID-19 Vaccine (4 - 2025-26 season) 09/08/2023   Medicare Annual Wellness (AWV)  03/31/2024   Colonoscopy  12/17/2025   DTaP/Tdap/Td (4 - Td or Tdap) 05/05/2031   Pneumococcal Vaccine: 50+ Years  Completed   Hepatitis C Screening  Completed   HPV VACCINES  Aged Out   Meningococcal B Vaccine  Aged Out     See Problem List for Assessment and Plan of chronic medical problems.

## 2023-09-08 NOTE — Patient Instructions (Addendum)
 Blood work was ordered.       Medications changes include :   None    A referral was ordered and someone will call you to schedule an appointment.     Return in about 6 months (around 03/08/2024) for follow up.    Health Maintenance, Male Adopting a healthy lifestyle and getting preventive care are important in promoting health and wellness. Ask your health care provider about: The right schedule for you to have regular tests and exams. Things you can do on your own to prevent diseases and keep yourself healthy. What should I know about diet, weight, and exercise? Eat a healthy diet  Eat a diet that includes plenty of vegetables, fruits, low-fat dairy products, and lean protein. Do not eat a lot of foods that are high in solid fats, added sugars, or sodium. Maintain a healthy weight Body mass index (BMI) is a measurement that can be used to identify possible weight problems. It estimates body fat based on height and weight. Your health care provider can help determine your BMI and help you achieve or maintain a healthy weight. Get regular exercise Get regular exercise. This is one of the most important things you can do for your health. Most adults should: Exercise for at least 150 minutes each week. The exercise should increase your heart rate and make you sweat (moderate-intensity exercise). Do strengthening exercises at least twice a week. This is in addition to the moderate-intensity exercise. Spend less time sitting. Even light physical activity can be beneficial. Watch cholesterol and blood lipids Have your blood tested for lipids and cholesterol at 78 years of age, then have this test every 5 years. You may need to have your cholesterol levels checked more often if: Your lipid or cholesterol levels are high. You are older than 78 years of age. You are at high risk for heart disease. What should I know about cancer screening? Many types of cancers can be detected  early and may often be prevented. Depending on your health history and family history, you may need to have cancer screening at various ages. This may include screening for: Colorectal cancer. Prostate cancer. Skin cancer. Lung cancer. What should I know about heart disease, diabetes, and high blood pressure? Blood pressure and heart disease High blood pressure causes heart disease and increases the risk of stroke. This is more likely to develop in people who have high blood pressure readings or are overweight. Talk with your health care provider about your target blood pressure readings. Have your blood pressure checked: Every 3-5 years if you are 97-17 years of age. Every year if you are 33 years old or older. If you are between the ages of 74 and 40 and are a current or former smoker, ask your health care provider if you should have a one-time screening for abdominal aortic aneurysm (AAA). Diabetes Have regular diabetes screenings. This checks your fasting blood sugar level. Have the screening done: Once every three years after age 32 if you are at a normal weight and have a low risk for diabetes. More often and at a younger age if you are overweight or have a high risk for diabetes. What should I know about preventing infection? Hepatitis B If you have a higher risk for hepatitis B, you should be screened for this virus. Talk with your health care provider to find out if you are at risk for hepatitis B infection. Hepatitis C Blood testing is recommended  for: Everyone born from [birth year] through 1965. Anyone with known risk factors for hepatitis C. Sexually transmitted infections (STIs) You should be screened each year for STIs, including gonorrhea and chlamydia, if: You are sexually active and are younger than 78 years of age. You are older than 78 years of age and your health care provider tells you that you are at risk for this type of infection. Your sexual activity has changed since  you were last screened, and you are at increased risk for chlamydia or gonorrhea. Ask your health care provider if you are at risk. Ask your health care provider about whether you are at high risk for HIV. Your health care provider may recommend a prescription medicine to help prevent HIV infection. If you choose to take medicine to prevent HIV, you should first get tested for HIV. You should then be tested every 3 months for as long as you are taking the medicine. Follow these instructions at home: Alcohol use Do not drink alcohol if your health care provider tells you not to drink. If you drink alcohol: Limit how much you have to 0-2 drinks a day. Know how much alcohol is in your drink. In the [place] one drink equals one 12 oz bottle of beer (355 mL), one 5 oz glass of wine (148 mL), or one 1 oz glass of hard liquor (44 mL). Lifestyle Do not use any products that contain nicotine or tobacco. These products include cigarettes, chewing tobacco, and vaping devices, such as e-cigarettes. If you need help quitting, ask your health care provider. Do not use street drugs. Do not share needles. Ask your health care provider for help if you need support or information about quitting drugs. General instructions Schedule regular health, dental, and eye exams. Stay current with your vaccines. Tell your health care provider if: You often feel depressed. You have ever been abused or do not feel safe at home. Summary Adopting a healthy lifestyle and getting preventive care are important in promoting health and wellness. Follow your health care provider's instructions about healthy diet, exercising, and getting tested or screened for diseases. Follow your health care provider's instructions on monitoring your cholesterol and blood pressure. This information is not intended to replace advice given to you by your health care provider. Make sure you discuss any questions you have with your health care  provider. Document Revised: 05/15/2020 Document Reviewed: 05/15/2020 Elsevier Patient Education  2024 [employer].

## 2023-09-09 ENCOUNTER — Ambulatory Visit: Payer: Self-pay | Admitting: Internal Medicine

## 2023-09-09 ENCOUNTER — Ambulatory Visit (INDEPENDENT_AMBULATORY_CARE_PROVIDER_SITE_OTHER): Admitting: Internal Medicine

## 2023-09-09 VITALS — BP 138/80 | HR 66 | Temp 98.1°F | Ht 71.0 in | Wt 195.0 lb

## 2023-09-09 DIAGNOSIS — G8929 Other chronic pain: Secondary | ICD-10-CM

## 2023-09-09 DIAGNOSIS — T451X5A Adverse effect of antineoplastic and immunosuppressive drugs, initial encounter: Secondary | ICD-10-CM

## 2023-09-09 DIAGNOSIS — R7303 Prediabetes: Secondary | ICD-10-CM | POA: Diagnosis not present

## 2023-09-09 DIAGNOSIS — G620 Drug-induced polyneuropathy: Secondary | ICD-10-CM

## 2023-09-09 DIAGNOSIS — M79674 Pain in right toe(s): Secondary | ICD-10-CM | POA: Diagnosis not present

## 2023-09-09 DIAGNOSIS — M79671 Pain in right foot: Secondary | ICD-10-CM | POA: Diagnosis not present

## 2023-09-09 DIAGNOSIS — Z0000 Encounter for general adult medical examination without abnormal findings: Secondary | ICD-10-CM

## 2023-09-09 DIAGNOSIS — Z125 Encounter for screening for malignant neoplasm of prostate: Secondary | ICD-10-CM | POA: Diagnosis not present

## 2023-09-09 DIAGNOSIS — E559 Vitamin D deficiency, unspecified: Secondary | ICD-10-CM | POA: Diagnosis not present

## 2023-09-09 DIAGNOSIS — M542 Cervicalgia: Secondary | ICD-10-CM | POA: Diagnosis not present

## 2023-09-09 DIAGNOSIS — I2510 Atherosclerotic heart disease of native coronary artery without angina pectoris: Secondary | ICD-10-CM | POA: Diagnosis not present

## 2023-09-09 DIAGNOSIS — I10 Essential (primary) hypertension: Secondary | ICD-10-CM

## 2023-09-09 DIAGNOSIS — C3491 Malignant neoplasm of unspecified part of right bronchus or lung: Secondary | ICD-10-CM | POA: Diagnosis not present

## 2023-09-09 DIAGNOSIS — E782 Mixed hyperlipidemia: Secondary | ICD-10-CM | POA: Diagnosis not present

## 2023-09-09 DIAGNOSIS — L600 Ingrowing nail: Secondary | ICD-10-CM | POA: Diagnosis not present

## 2023-09-09 DIAGNOSIS — K219 Gastro-esophageal reflux disease without esophagitis: Secondary | ICD-10-CM

## 2023-09-09 LAB — BASIC METABOLIC PANEL WITH GFR
BUN: 19 mg/dL (ref 6–23)
CO2: 27 meq/L (ref 19–32)
Calcium: 8.5 mg/dL (ref 8.4–10.5)
Chloride: 101 meq/L (ref 96–112)
Creatinine, Ser: 1.06 mg/dL (ref 0.40–1.50)
GFR: 67.39 mL/min (ref >60.00)
Glucose, Bld: 95 mg/dL (ref 70–99)
Potassium: 4.3 meq/L (ref 3.5–5.1)
Sodium: 139 meq/L (ref 135–145)

## 2023-09-09 LAB — VITAMIN D 25 HYDROXY (VIT D DEFICIENCY, FRACTURES): VITD: 41.4 ng/mL (ref 30.00–100.00)

## 2023-09-09 LAB — HEMOGLOBIN A1C: Hgb A1c MFr Bld: 6.3 % (ref 4.6–6.5)

## 2023-09-09 LAB — PSA, MEDICARE: PSA: 1.02 ng/mL (ref 0.10–4.00)

## 2023-09-09 MED ORDER — ROSUVASTATIN CALCIUM 20 MG PO TABS: 20.0000 mg | ORAL_TABLET | Freq: Every day | ORAL | 3 refills | Status: AC

## 2023-09-09 MED ORDER — IRBESARTAN 75 MG PO TABS: 75.0000 mg | ORAL_TABLET | Freq: Every day | ORAL | 3 refills | Status: AC

## 2023-09-09 NOTE — Assessment & Plan Note (Addendum)
 Chronic Saw neurosurgery - not a candidate for surgery

## 2023-09-09 NOTE — Assessment & Plan Note (Signed)
 Chronic Lab Results  Component Value Date   HGBA1C 5.9 03/06/2023   Encouraged healthy diet and maintaining exercise Check A1c

## 2023-09-09 NOTE — Assessment & Plan Note (Addendum)
 Chronic Lipid panel to be checked by cardiology-just increased dose of Crestor  Continue crestor  20 mg daily Regular exercise and healthy diet encouraged

## 2023-09-09 NOTE — Assessment & Plan Note (Signed)
 Chronic Taking vitamin D daily Check vitamin D level

## 2023-09-09 NOTE — Assessment & Plan Note (Signed)
 Chronic Improved by amitriptyline 

## 2023-09-09 NOTE — Assessment & Plan Note (Addendum)
 Chronic Being followed at [facility] oncology and [facility] B/L lung cancer-3 different types of cancer have been found S/p chemo x 3 treatments-carboplatin , paclitaxel , nivolumab  VATS on right 11/23 - Lymph nodes positive Saw oncology in July-disease stable without evidence of progression or metastatic disease Next imaging scheduled for December

## 2023-09-09 NOTE — Assessment & Plan Note (Signed)
 Chronic GERD controlled Continue omeprazole 40 mg daily

## 2023-09-09 NOTE — Assessment & Plan Note (Addendum)
 Chronic Blood pressure controlled BMP Continue avapro  75 mg daily

## 2023-09-09 NOTE — Assessment & Plan Note (Signed)
Chronic Continue gabapentin 300 mg 3 times daily 

## 2023-09-09 NOTE — Assessment & Plan Note (Signed)
 Following with cardiology No chest pain Continue current medications-aspirin  81 mg daily, rosuvastatin  20 mg daily

## 2023-09-30 ENCOUNTER — Encounter: Payer: Self-pay | Admitting: Pediatrics

## 2023-09-30 ENCOUNTER — Telehealth: Payer: Self-pay | Admitting: Medical Oncology

## 2023-09-30 NOTE — Telephone Encounter (Signed)
 Pt wants to know if he should f/u with [facility] regarding his voice change after COIVD and tx for same. I told pt to contact PCP with his concern.

## 2023-10-01 ENCOUNTER — Encounter: Payer: Self-pay | Admitting: Internal Medicine

## 2023-10-01 NOTE — Progress Notes (Signed)
 "   Subjective:    Patient ID: Patrick Harris, male    DOB: May 20, 1945, 78 y.o.   MRN: [id number]      HPI Patrick Harris is here for  Chief Complaint  Patient presents with   Nasal Congestion    Patient concerned about raspy voice; throat Patrick Harris; chest hurts when coughing     Stinging and burning on right side --- a few minutes ago on left side  Mouth dry   No tongue coating.  Change in taste.  Usieng biotene lozenges   Discussed the use of AI scribe software for clinical note transcription with the patient, who gave verbal consent to proceed.  History of Present Illness Patrick Harris is a 78 year old male who presents with voice changes and throat discomfort following COVID-19 infection.  Patrick Harris voice began breaking up about a week after recovering from COVID-19, and the condition has progressively worsened. Patrick Harris experiences burning sensations in Patrick Harris throat, particularly at night when reclining, leading to coughing fits. Patrick Harris describes expectorating 'hard, chunky stuff,' sometimes comparing it to a cornflake. The burning sensation can occur on either side of Patrick Harris throat and is sometimes accompanied by xerostomia. Drinking water provides minimal relief, and Patrick Harris uses Biotene products to manage the dryness. Patrick Harris denies heartburn or reflux but notes that eating a snack at night can trigger the burning sensation. Patrick Harris uses a throat-numbing spray for temporary relief and reports a change in taste over the past few days, although there is no coating on Patrick Harris tongue.  Patrick Harris has been taking amitriptyline  10 mg for stomach pain since February and suspects it might be causing throat dryness. Attempts to reduce Patrick Harris dosage by skipping doses led to a return of stomach pain, prompting him to resume the 10 mg dose. Patrick Harris uses lozenges and drinks water to alleviate the dryness.  Patrick Harris has a history of COPD and uses pink salt steam inhalation, which sometimes helps loosen mucus. Patrick Harris has tried various treatments, including  amoxicillin  with clavulanate, cough syrup, and prednisone , but none have alleviated Patrick Harris symptoms. Patrick Harris has started taking allergy medication, which has helped with a previously runny nose.  Patrick Harris is currently taking omeprazole  daily. Patrick Harris has stopped taking folic acid  and vitamin B12. Patrick Harris continues to take vitamins E and D. Patrick Harris experiences frequent urination at night and denies any drainage down the back of Patrick Harris throat.     Medications and allergies reviewed with patient and updated if appropriate.  Current Outpatient Medications on File Prior to Visit  Medication Sig Dispense Refill   amitriptyline  (ELAVIL ) 10 MG tablet TAKE 1 TABLET BY MOUTH EVERYDAY AT BEDTIME 30 tablet 5   aspirin  EC 81 MG tablet Take 1 tablet (81 mg total) by mouth at bedtime. Okay to restart this medication on 07/03/2021 30 tablet 11   cabergoline (DOSTINEX) 0.5 MG tablet Take 0.25 mg by mouth 2 (two) times a week.     Cholecalciferol (VITAMIN D3) 2000 UNITS TABS Take 2,000 Units by mouth in the morning and at bedtime.     Coenzyme Q10 (COQ-10) 100 MG CAPS Take 100 mg by mouth in the morning.     finasteride (PROSCAR) 5 MG tablet Take 5 mg by mouth daily.     gabapentin  (NEURONTIN ) 300 MG capsule Take 1 capsule (300 mg total) by mouth 3 (three) times daily. 60 capsule 3   irbesartan  (AVAPRO ) 75 MG tablet Take 1 tablet (75 mg total) by mouth daily. 90 tablet 3   nitroGLYCERIN  (NITROSTAT )  0.4 MG SL tablet Place 1 tablet (0.4 mg total) under the tongue every 5 (five) minutes x 3 doses as needed for chest pain (if no relief after 2nd dose, proceed to ED or call 911). 25 tablet 3   Omega-3 Fatty Acids (OMEGA-3 FISH OIL PO) Take 1,000 mg by mouth in the morning.     omeprazole  (PRILOSEC ) 40 MG capsule Take 1 capsule (40 mg total) by mouth daily. 90 capsule 3   polyethylene glycol (MIRALAX / GLYCOLAX) 17 g packet Take 17 g by mouth as needed.     rosuvastatin  (CRESTOR ) 20 MG tablet Take 1 tablet (20 mg total) by mouth daily. 90 tablet 3    tamsulosin  (FLOMAX ) 0.4 MG CAPS capsule Take 0.4 mg by mouth in the morning.     Testosterone  20.25 MG/ACT (1.62%) GEL Apply 2 Pump topically in the morning.     vitamin C (ASCORBIC ACID) 500 MG tablet Take 500 mg by mouth in the morning.     Vitamin E (VITAMIN E/D-ALPHA NATURAL) 268 MG (400 UNIT) CAPS Take 1 tablet by mouth daily.     No current facility-administered medications on file prior to visit.    Review of Systems  HENT:  Positive for voice change. Negative for postnasal drip, sore throat and trouble swallowing.   Respiratory:  Positive for cough.   Gastrointestinal:        No gerd       Objective:   Vitals:   10/02/23 1008  BP: (!) 144/82  Pulse: 80  Temp: 97.8 F (36.6 C)  SpO2: 94%   BP Readings from Last 3 Encounters:  10/02/23 (!) 144/82  09/09/23 138/80  08/26/23 (!) 156/74   Wt Readings from Last 3 Encounters:  10/02/23 191 lb (86.6 kg)  09/09/23 195 lb (88.5 kg)  08/26/23 198 lb (89.8 kg)   Body mass index is 26.64 kg/m.    Physical Exam Constitutional:      General: Patrick Harris is not in acute distress.    Appearance: Normal appearance. Patrick Harris is not ill-appearing.  HENT:     Head: Normocephalic and atraumatic.     Mouth/Throat:     Mouth: Mucous membranes are moist.     Pharynx: No posterior oropharyngeal erythema.     Comments: No coating on tongue Eyes:     Conjunctiva/sclera: Conjunctivae normal.  Cardiovascular:     Rate and Rhythm: Normal rate and regular rhythm.  Pulmonary:     Effort: Pulmonary effort is normal.     Breath sounds: Normal breath sounds.  Musculoskeletal:     Cervical back: Neck supple. No tenderness.  Lymphadenopathy:     Cervical: No cervical adenopathy.  Skin:    General: Skin is warm and dry.  Neurological:     Mental Status: Patrick Harris is alert.            Assessment & Plan:    See Problem List for Assessment and Plan of chronic medical problems.     Assessment and Plan Assessment & Plan Chronic cough with  throat burning, hoarseness, and xerostomia likely medication-induced Chronic cough with throat burning and hoarseness, possibly exacerbated by xerostomia. Symptoms likely multifactorial.  Chronic cough likely related to COPD, dry mouth, lung cancer and possibly GERD.  Throat burning and hoarseness possibly related to GERD as well despite him not feeling it. - Continue current medications. - Increase fluid intake. - Prescribe cough syrup for nighttime.  Hycodan cough syrup - Start sucralfate  1 g 3 times daily  AC and at bedtime - Continue omeprazole  40 mg daily  - Update me if symptoms improve or do not improve    GERD, History of esophageal stenosis Symptoms include burning sensation in the throat, hoarseness and cough possibly related to esophageal irritation or reflux. - Prescribe sucralfate  1 g 3 times daily AC and at bedtime -Continue omeprazole  40 mg daily. - Monitor for symptom improvement.  Chronic stomach pain managed with amitriptyline  Reducing dosage resulted in recurrence of pain. Concerns about xerostomia, but maintaining dose to prevent pain recurrence. - Continue amitriptyline  10 mg daily. - Monitor stomach pain and xerostomia.     "

## 2023-10-02 ENCOUNTER — Ambulatory Visit: Admitting: Internal Medicine

## 2023-10-02 VITALS — BP 144/82 | HR 80 | Temp 97.8°F | Ht 71.0 in | Wt 191.0 lb

## 2023-10-02 DIAGNOSIS — R052 Subacute cough: Secondary | ICD-10-CM

## 2023-10-02 DIAGNOSIS — K219 Gastro-esophageal reflux disease without esophagitis: Secondary | ICD-10-CM

## 2023-10-02 DIAGNOSIS — R1033 Periumbilical pain: Secondary | ICD-10-CM

## 2023-10-02 DIAGNOSIS — G8929 Other chronic pain: Secondary | ICD-10-CM

## 2023-10-02 MED ORDER — SUCRALFATE 1 G PO TABS: 1.0000 g | ORAL_TABLET | Freq: Three times a day (TID) | ORAL | 0 refills | Status: AC

## 2023-10-02 MED ORDER — HYDROCODONE BIT-HOMATROP MBR 5-1.5 MG/5ML PO SOLN: 5.0000 mL | Freq: Three times a day (TID) | ORAL | 0 refills | Status: AC | PRN

## 2023-10-02 NOTE — Patient Instructions (Addendum)
       Medications changes include :   sucralfate  1 gm there times daily prior to meals and at bedtime and cough syrup       Return if symptoms worsen or fail to improve.

## 2023-10-16 ENCOUNTER — Ambulatory Visit (INDEPENDENT_AMBULATORY_CARE_PROVIDER_SITE_OTHER)

## 2023-10-16 ENCOUNTER — Ambulatory Visit (INDEPENDENT_AMBULATORY_CARE_PROVIDER_SITE_OTHER): Admitting: Adult Health

## 2023-10-16 ENCOUNTER — Encounter: Payer: Self-pay | Admitting: Adult Health

## 2023-10-16 VITALS — BP 137/75 | HR 70 | Temp 98.3°F | Ht 71.75 in | Wt 196.6 lb

## 2023-10-16 DIAGNOSIS — J439 Emphysema, unspecified: Secondary | ICD-10-CM | POA: Diagnosis not present

## 2023-10-16 DIAGNOSIS — Z85118 Personal history of other malignant neoplasm of bronchus and lung: Secondary | ICD-10-CM

## 2023-10-16 DIAGNOSIS — J441 Chronic obstructive pulmonary disease with (acute) exacerbation: Secondary | ICD-10-CM

## 2023-10-16 DIAGNOSIS — C3490 Malignant neoplasm of unspecified part of unspecified bronchus or lung: Secondary | ICD-10-CM

## 2023-10-16 DIAGNOSIS — R490 Dysphonia: Secondary | ICD-10-CM

## 2023-10-16 LAB — PULMONARY FUNCTION TEST
DL/VA % pred: 81 %
DL/VA: 3.17 ml/min/mmHg/L
DLCO unc % pred: 53 %
DLCO unc: 13.91 ml/min/mmHg
FEF 25-75 Post: 1 L/s
FEF 25-75 Pre: 0.92 L/s
FEF2575-%Change-Post: 8 %
FEF2575-%Pred-Post: 44 %
FEF2575-%Pred-Pre: 41 %
FEV1-%Change-Post: 2 %
FEV1-%Pred-Post: 60 %
FEV1-%Pred-Pre: 59 %
FEV1-Post: 1.93 L
FEV1-Pre: 1.87 L
FEV1FVC-%Change-Post: 1 %
FEV1FVC-%Pred-Pre: 82 %
FEV6-%Change-Post: 0 %
FEV6-%Pred-Post: 76 %
FEV6-%Pred-Pre: 76 %
FEV6-Post: 3.16 L
FEV6-Pre: 3.15 L
FEV6FVC-%Change-Post: 0 %
FEV6FVC-%Pred-Post: 105 %
FEV6FVC-%Pred-Pre: 106 %
FVC-%Change-Post: 1 %
FVC-%Pred-Post: 72 %
FVC-%Pred-Pre: 71 %
FVC-Post: 3.22 L
FVC-Pre: 3.16 L
Post FEV1/FVC ratio: 60 %
Post FEV6/FVC ratio: 99 %
Pre FEV1/FVC ratio: 59 %
Pre FEV6/FVC Ratio: 100 %
RV % pred: 114 %
RV: 3.11 L
TLC % pred: 83 %
TLC: 6.19 L

## 2023-10-16 MED ORDER — STIOLTO RESPIMAT 2.5-2.5 MCG/ACT IN AERS: 2.0000 | INHALATION_SPRAY | Freq: Every day | RESPIRATORY_TRACT | 5 refills | Status: DC

## 2023-10-16 MED ORDER — AMOXICILLIN-POT CLAVULANATE 875-125 MG PO TABS: 1.0000 | ORAL_TABLET | Freq: Two times a day (BID) | ORAL | 0 refills | Status: DC

## 2023-10-16 MED ORDER — STIOLTO RESPIMAT 2.5-2.5 MCG/ACT IN AERS: 2.0000 | INHALATION_SPRAY | Freq: Every day | RESPIRATORY_TRACT | Status: DC

## 2023-10-16 NOTE — Patient Instructions (Addendum)
 Augmentin  875mg  Twice daily  for 7 days  Try Liquid Mucinex DM 1 tsp Twice daily As needed  cough Begin Stiolto 2 puffs daily  Activity as tolerated.  Add Flutter valve Twice daily   Follow up with Dr. Theophilus or Dominque Levandowski NP in 3 months and As needed   Please contact office for sooner follow up if symptoms do not improve or worsen or seek emergency care

## 2023-10-16 NOTE — Patient Instructions (Signed)
 Full pft performed today

## 2023-10-16 NOTE — Progress Notes (Signed)
 Full pft performed today

## 2023-10-16 NOTE — Addendum Note (Signed)
 Addended byBETHA JESSICA BOUCHARD S on: 10/16/2023 04:41 PM   Modules accepted: Orders

## 2023-10-16 NOTE — Progress Notes (Signed)
 @Patient  ID: Patrick Harris, male    DOB: [birth year], 78 y.o.   MRN: [id number]  Chief Complaint  Patient presents with   Follow-up    PFT and cough    Referring provider: Geofm Glade PARAS, MD  HPI: 78 year old male former smoker followed for COPD and lung cancer (diagnosed June 2023 with stage IIIa adenocarcinoma of the right upper lobe, stage Ia squamous cell carcinoma in the right upper lobe and a stage Ia adenocarcinoma of the left upper lobe-status post right upper lobectomy followed by Neo adjunctive chemotherapy with carboplatin , paclitaxel  and Nivolumab  and radiation therapy  Medical history significant for hyperprolactinemia on cabergoline    TEST/EVENTS :  Pets: He has no pets currently but had a Weimaraner in the past.  Occupation:Worked as an Personnel officer at [employer]. Exposures: He denies exposure to mold, hot tubs, jacuzzis, or feather pillows. He uses a pillow filled with seeds.  No h/o chemo/XRT/amiodarone/macrodantin/MTX  No exposure to asbestos, silica or other organic allergens  Smoking history: 30-pack-year smoker.  Quit in 2023 Travel history:He has lived in [place]  all his life Relevant family history: no family history of lung disease.  CBC 04/29/2023-WBC 4.9, eos 1%, absolute eosinophil count 49   Discussed the use of AI scribe software for clinical note transcription with the patient, who gave verbal consent to proceed.  History of Present Illness Patrick Harris is a 78 year old male with COPD and a history of lung cancer who presents for a three-month follow-up.  He has a history of adenocarcinoma of the right upper lobe, stage 1A squamous cell carcinoma in the right upper lobe, and stage 1A adenocarcinoma of the left upper lobe. He underwent a right upper lobectomy and received radiation therapy followed by neoadjuvant chemotherapy. His most recent surveillance CT on July 25, 2023, showed post-surgical changes of the right upper  lobectomy, stable or slightly decreased size in the right thoracic lymph nodes, and no evidence of recurrent or metastatic disease.  His pulmonary function testing today shows  FEV1 at 60%, ratio 60, FVC at 72%, and DLCO at 53%. He has a former smoking history and was treated for acute bronchitis with a Z-Pak and prednisone  at his last visit. He is feeling better but continues to experience shortness of breath and have an ongoing productive cough since the lobectomy and radiation therapy. Prior to his cancer diagnosis, he was asymptomatic and able to perform any activity without difficulty.  He contracted COVID-19 about 1 months ago, which he believes worsened his symptoms, including a persistent cough and hoarseness. He was treated with Paxlovid  for COVID-19 He describes coughing up hard, scab-like mucus that causes burning in his throat and notes that this has been occurring for about a year and a half.  Today in office he coughs up very thick green mucus.  He experiences difficulty with mucus production, describing it as 'hard bubble gum' and sometimes green in color, which he finds challenging to expectorate. He mentions using salt steam inhalation, which provides some relief. He also reports a history of heartburn and takes omeprazole  for a hiatal hernia. No significant sinus drainage but acknowledges some allergy-related symptoms.  He has neuropathy in both hands, worse in the right, which he attributes to chemotherapy. He also mentions using a cough suppressant prescribed by another doctor, which he takes only at night.     Allergies  Allergen Reactions   Latex Dermatitis    Contact  dermatitis   Bromocriptine Nausea  Only    Dry heaves   Zestril  [Lisinopril ] Other (See Comments)    Leg pain, inc HR, dizziness, headache   Diovan  [Valsartan ] Nausea Only   Hydrochlorothiazide  Other (See Comments)    Possibly caused pancreatitis 2018   Norvasc  [Amlodipine ] Other (See Comments)    Edema     Zocor [Simvastatin]     Joint pain    Immunization History  Administered Date(s) Administered   Fluad Quad(high Dose 65+) 10/12/2020   INFLUENZA, HIGH DOSE SEASONAL PF 10/16/2012, 10/15/2013, 10/20/2017, 10/30/2017, 10/19/2018, 11/03/2019, 10/15/2021   Influenza Split 10/25/2004, 10/30/2005, 10/08/2018   Influenza Whole 10/07/2007, 10/17/2008   Influenza,inj,Quad PF,6+ Mos 10/16/2021   Influenza-Unspecified 12/25/1999, 02/11/2003, 10/25/2004, 10/30/2005, 10/25/2015, 10/14/2016   Moderna Sars-Covid-2 Vaccination 01/26/2019, 02/23/2019, 09/16/2019   Pneumococcal Conjugate-13 08/19/2014   Pneumococcal Polysaccharide-23 08/09/2010   Td 01/07/2001   Tdap 09/30/2011, 05/04/2021    Past Medical History:  Diagnosis Date   Arthritis    CAD (coronary artery disease)    CABG with LIMA to LAD and RIMA to RCA August 2000 - Dr. Kerrin   Cancer [facility])    basal cell   CHF (congestive heart failure) (HCC)    COPD (chronic obstructive pulmonary disease) (HCC)    Elevated glycated hemoglobin    Essential hypertension    GERD (gastroesophageal reflux disease)    Heart murmur    History of radiation therapy    Right lung(chest) 03/19/22-05/06/22- Dr. Lynwood Nasuti   History of radiation therapy    Right Lung- 02/25/23-03/17/23- Dr. Lynwood Nasuti   Hyperlipidemia    Hypopituitarism    Lung cancer (HCC)    Osteopenia    Dr Loetta   Pancreatitis    Peyronie disease    Shingles    Testosterone  deficiency    Dr Loetta    Tobacco History: Social History   Tobacco Use  Smoking Status Former   Current packs/day: 0.00   Types: Cigarettes   Quit date: 05/2021   Years since quitting: 2.4   Passive exposure: Past  Smokeless Tobacco Never  Tobacco Comments   Smoked about 40 years, 1ppd   Counseling given: Not Answered Tobacco comments: Smoked about 40 years, 1ppd   Outpatient Medications Prior to Visit  Medication Sig Dispense Refill   amitriptyline  (ELAVIL ) 10 MG tablet TAKE 1  TABLET BY MOUTH EVERYDAY AT BEDTIME 30 tablet 5   aspirin  EC 81 MG tablet Take 1 tablet (81 mg total) by mouth at bedtime. Okay to restart this medication on 07/03/2021 30 tablet 11   cabergoline (DOSTINEX) 0.5 MG tablet Take 0.25 mg by mouth 2 (two) times a week.     Cholecalciferol (VITAMIN D3) 2000 UNITS TABS Take 2,000 Units by mouth in the morning and at bedtime.     Coenzyme Q10 (COQ-10) 100 MG CAPS Take 100 mg by mouth in the morning.     finasteride (PROSCAR) 5 MG tablet Take 5 mg by mouth daily.     HYDROcodone  bit-homatropine (HYCODAN) 5-1.5 MG/5ML syrup Take 5 mLs by mouth every 8 (eight) hours as needed for cough. 120 mL 0   irbesartan  (AVAPRO ) 75 MG tablet Take 1 tablet (75 mg total) by mouth daily. 90 tablet 3   nitroGLYCERIN  (NITROSTAT ) 0.4 MG SL tablet Place 1 tablet (0.4 mg total) under the tongue every 5 (five) minutes x 3 doses as needed for chest pain (if no relief after 2nd dose, proceed to ED or call 911). 25 tablet 3   Omega-3 Fatty Acids (OMEGA-3 FISH  OIL PO) Take 1,000 mg by mouth in the morning.     omeprazole  (PRILOSEC ) 40 MG capsule Take 1 capsule (40 mg total) by mouth daily. 90 capsule 3   polyethylene glycol (MIRALAX / GLYCOLAX) 17 g packet Take 17 g by mouth as needed.     rosuvastatin  (CRESTOR ) 20 MG tablet Take 1 tablet (20 mg total) by mouth daily. 90 tablet 3   sucralfate  (CARAFATE ) 1 g tablet Take 1 tablet (1 g total) by mouth 4 (four) times daily -  with meals and at bedtime. Crush and dissolve in 10 mL warm water prior to swallowing, take 20 min before meals 120 tablet 0   tamsulosin  (FLOMAX ) 0.4 MG CAPS capsule Take 0.4 mg by mouth in the morning.     Testosterone  20.25 MG/ACT (1.62%) GEL Apply 2 Pump topically in the morning.     vitamin C (ASCORBIC ACID) 500 MG tablet Take 500 mg by mouth in the morning.     Vitamin E (VITAMIN E/D-ALPHA NATURAL) 268 MG (400 UNIT) CAPS Take 1 tablet by mouth daily.     gabapentin  (NEURONTIN ) 300 MG capsule Take 1 capsule (300  mg total) by mouth 3 (three) times daily. (Patient not taking: Reported on 10/16/2023) 60 capsule 3   No facility-administered medications prior to visit.     Review of Systems:   Constitutional:   No  weight loss, night sweats,  Fevers, chills,+ fatigue, or  lassitude.  HEENT:   No headaches,  Difficulty swallowing,  Tooth/dental problems, or  Sore throat,                No sneezing, itching, ear ache, +nasal congestion, post nasal drip,   CV:  No chest pain,  Orthopnea, PND, swelling in lower extremities, anasarca, dizziness, palpitations, syncope.   GI  No heartburn, indigestion, abdominal pain, nausea, vomiting, diarrhea, change in bowel habits, loss of appetite, bloody stools.   Resp: .  No chest wall deformity  Skin: no rash or lesions.  GU: no dysuria, change in color of urine, no urgency or frequency.  No flank pain, no hematuria   MS:  No joint pain or swelling.  No decreased range of motion.  No back pain.    Physical Exam  BP 137/75   Pulse 70   Temp 98.3 F (36.8 C)   Ht 5' 11.75 (1.822 m)   Wt 196 lb 9.6 oz (89.2 kg)   SpO2 96% Comment: RA  BMI 26.85 kg/m   GEN: A/Ox3; pleasant , NAD, well nourished    HEENT:  [place]/AT,  NOSE-clear, THROAT-clear, no lesions, no postnasal drip or exudate noted.   NECK:  Supple w/ fair ROM; no JVD; normal carotid impulses w/o bruits; no thyromegaly or nodules palpated; no lymphadenopathy.    RESP  Clear  P & A; w/o, wheezes/ rales/ or rhonchi. no accessory muscle use, no dullness to percussion  CARD:  RRR, no m/r/g, no peripheral edema, pulses intact, no cyanosis or clubbing.  GI:   Soft & nt; nml bowel sounds; no organomegaly or masses detected.   Musco: Warm bil, no deformities or joint swelling noted.   Neuro: alert, no focal deficits noted.    Skin: Warm, no lesions or rashes    Lab Results:  CBC    Component Value Date/Time   WBC 4.1 07/25/2023 0853   WBC 5.0 09/02/2022 0937   RBC 4.25 07/25/2023 0853    HGB 13.9 07/25/2023 0853   HCT 40.7 07/25/2023 0853  PLT 154 07/25/2023 0853   MCV 95.8 07/25/2023 0853   MCH 32.7 07/25/2023 0853   MCHC 34.2 07/25/2023 0853   RDW 12.9 07/25/2023 0853   LYMPHSABS 0.8 07/25/2023 0853   MONOABS 0.7 07/25/2023 0853   EOSABS 0.1 07/25/2023 0853   BASOSABS 0.0 07/25/2023 0853    BMET    Component Value Date/Time   NA 139 09/09/2023 0849   K 4.3 09/09/2023 0849   CL 101 09/09/2023 0849   CO2 27 09/09/2023 0849   GLUCOSE 95 09/09/2023 0849   BUN 19 09/09/2023 0849   CREATININE 1.06 09/09/2023 0849   CREATININE 1.00 07/25/2023 0853   CREATININE 1.07 08/25/2019 0847   CALCIUM  8.5 09/09/2023 0849   GFRNONAA >60 07/25/2023 0853   GFRAA >60 11/15/2016 1410    BNP No results found for: BNP  ProBNP No results found for: PROBNP  Imaging: No results found.  Administration History     None          Latest Ref Rng & Units 10/16/2023    2:34 PM  PFT Results  FVC-Pre L 3.16  P  FVC-Predicted Pre % 71  P  FVC-Post L 3.22  P  FVC-Predicted Post % 72  P  Pre FEV1/FVC % % 59  P  Post FEV1/FCV % % 60  P  FEV1-Pre L 1.87  P  FEV1-Predicted Pre % 59  P  FEV1-Post L 1.93  P  DLCO uncorrected ml/min/mmHg 13.91  P  DLCO UNC% % 53  P  DLVA Predicted % 81  P  TLC L 6.19  P  TLC % Predicted % 83  P  RV % Predicted % 114  P    P Preliminary result    No results found for: NITRICOXIDE      Assessment & Plan:   Assessment and Plan Assessment & Plan Chronic obstructive pulmonary disease (COPD) with chronic cough and mucus production   He has moderate COPD FEV1 at 60% ratio 60, FVC  72%, and DLCO 53%.  Start Stiolto inhaler, two puffs once daily. Recommend liquid Mucinex, a teaspoon twice a day. Add flutter valve twice a day for mucus drainage. Continue omeprazole .    Suspected bronchitis following COVID-19 infection   Secondary bacterial bronchitis is suspected following a recent COVID-19 infection.  Prescribe Augmentin  875 mg  twice daily for 1 week.  Cough control regimen  Right and left upper lobe lung cancer, status post lobectomy, radiation, and chemotherapy, no evidence of recurrence on most recent surveillance CT chest He has stage IIIA adenocarcinoma and stage IA squamous cell carcinoma in the right upper lobe, status post right upper lobectomy, and stage IA adenocarcinoma of the left upper lobe, status post radiation therapy and neoadjuvant chemotherapy. A recent surveillance CT on July 25, 2023, showed post-surgical change with no evidence of recurrent or metastatic disease. Continue follow-up with oncology in December.  Hoarseness-if not resolving will need referral to ENT  Plan  Patient Instructions  Augmentin  875mg  Twice daily  for 7 days  Try Liquid Mucinex DM 1 tsp Twice daily As needed  cough Begin Stiolto 2 puffs daily  Activity as tolerated.  Add Flutter valve Twice daily   Follow up with Dr. Theophilus or Nevin Kozuch NP in 3 months and As needed   Please contact office for sooner follow up if symptoms do not improve or worsen or seek emergency care             Madelin Stank, NP 10/16/2023

## 2023-10-24 ENCOUNTER — Inpatient Hospital Stay: Attending: Internal Medicine

## 2023-12-09 ENCOUNTER — Encounter (HOSPITAL_COMMUNITY): Payer: Self-pay | Admitting: Emergency Medicine

## 2023-12-09 ENCOUNTER — Emergency Department (HOSPITAL_COMMUNITY)

## 2023-12-09 ENCOUNTER — Emergency Department (HOSPITAL_COMMUNITY)
Admission: EM | Admit: 2023-12-09 | Discharge: 2023-12-09 | Disposition: A | Discharge status: Home / Self Care | Attending: Emergency Medicine | Admitting: Emergency Medicine

## 2023-12-09 DIAGNOSIS — R0789 Other chest pain: Secondary | ICD-10-CM | POA: Insufficient documentation

## 2023-12-09 DIAGNOSIS — M79601 Pain in right arm: Secondary | ICD-10-CM | POA: Diagnosis present

## 2023-12-09 DIAGNOSIS — R200 Anesthesia of skin: Secondary | ICD-10-CM | POA: Diagnosis not present

## 2023-12-09 DIAGNOSIS — Z85118 Personal history of other malignant neoplasm of bronchus and lung: Secondary | ICD-10-CM | POA: Diagnosis not present

## 2023-12-09 DIAGNOSIS — M792 Neuralgia and neuritis, unspecified: Secondary | ICD-10-CM | POA: Diagnosis not present

## 2023-12-09 LAB — BASIC METABOLIC PANEL WITH GFR
Anion gap: 11 (ref 5–15)
BUN: 16 mg/dL (ref 8–23)
CO2: 25 mmol/L (ref 22–32)
Calcium: 9.5 mg/dL (ref 8.9–10.3)
Chloride: 103 mmol/L (ref 98–111)
Creatinine, Ser: 0.93 mg/dL (ref 0.61–1.24)
GFR, Estimated: >60 mL/min (ref >60)
Glucose, Bld: 100 mg/dL — ABNORMAL HIGH (ref 70–99)
Potassium: 4.5 mmol/L (ref 3.5–5.1)
Sodium: 138 mmol/L (ref 135–145)

## 2023-12-09 LAB — TROPONIN T, HIGH SENSITIVITY: Troponin T High Sensitivity: 19 ng/L (ref 0–19)

## 2023-12-09 LAB — D-DIMER, QUANTITATIVE: D-Dimer, Quant: 0.48 ug{FEU}/mL (ref 0.00–0.50)

## 2023-12-09 LAB — CBC
HCT: 49.1 % (ref 39.0–52.0)
Hemoglobin: 16.8 g/dL (ref 13.0–17.0)
MCH: 32.1 pg (ref 26.0–34.0)
MCHC: 34.2 g/dL (ref 30.0–36.0)
MCV: 93.9 fL (ref 80.0–100.0)
Platelets: 161 K/uL (ref 150–400)
RBC: 5.23 MIL/uL (ref 4.22–5.81)
RDW: 13.2 % (ref 11.5–15.5)
WBC: 4.7 K/uL (ref 4.0–10.5)
nRBC: 0 % (ref 0.0–0.2)

## 2023-12-09 MED ORDER — PREDNISONE 10 MG PO TABS: 20.0000 mg | ORAL_TABLET | Freq: Every day | ORAL | 0 refills | Status: DC

## 2023-12-09 MED ORDER — GABAPENTIN 100 MG PO CAPS: ORAL_CAPSULE | ORAL | 0 refills | Status: AC

## 2023-12-09 MED ORDER — PREDNISONE 20 MG PO TABS
40.0000 mg | ORAL_TABLET | Freq: Once | ORAL | Status: AC
  Administered 2023-12-09: 40 mg via ORAL
  Filled 2023-12-09: qty 2

## 2023-12-09 NOTE — ED Triage Notes (Addendum)
 Pt arriving POV with complaint of right arm pain. Pt reports he got his flu shot October 17th in his right arm and he has had pain and tingling since. Pt states the pain has steadily increased. ROM normal. Pt also reports hx of lung cancer, last tx was approx 1 year ago

## 2023-12-09 NOTE — Discharge Instructions (Addendum)
 You have been diagnosed by your caregiver as having chest wall pain. SEEK IMMEDIATE MEDICAL ATTENTION IF: You develop a fever.  Your chest pains become severe or intolerable.  You develop new, unexplained symptoms (problems).  You develop shortness of breath, nausea, vomiting, sweating or feel light headed.  You develop a new cough or you cough up blood.   Get help right away if you have pain out of proportion to exam you developed new weakness difficulty holding objects with the right hand or using the arm.  Get help right away if you have any new onset chest pain.

## 2023-12-09 NOTE — ED Provider Notes (Signed)
 [place] EMERGENCY DEPARTMENT AT [facility] Provider Note   CSN: [id number] Arrival date & time: 12/09/23  9045     Patient presents with: Arm Pain   Patrick Harris is a 78 y.o. male.   Patient history of CAD, lung cancer, CHF, COPD, hypertension, GERD, hyperlipidemia, shingles, pancreatitis presents today with complaints of chest and right arm pain.  Reports that his symptoms began on 10/17, reports he got his flu shot on 10/16 and was doing well afterwards.  Reports that the next morning he woke up with a burning sensation on the inside of his right arm. Reports pain feels like bees stinging me. Reports that since then symptoms have progressed to where he is now having a burning sensation all the way down his arm and up into his axilla and his anterior chest wall.  Also reports some numbness/tingling to the outside of his right arm.  Reports that he is having some deeper chest pain in his right chest and his right shoulder blade as well.  Denies any trauma or injuries.  He has not seen anyone for this.  Denies any weakness in his hand or arm.  No shortness of breath.  Reports that he does have a history of chemotherapy-induced neuropathy, and reports this does feel somewhat consistent with his neuropathic pain however it is more severe.  Reports that any touch to the inside of his arm or chest wall results in pain.  Reports he is not currently undergoing treatment for his cancer, still has a port site in place to his right chest, has a CT scan scheduled tomorrow to determine if he is in remission.  Denies leg pain or leg swelling, recent travel or recent surgeries.  No history of blood clots. Reports that he has an appointment scheduled to see neurology for evaluation of these symptoms on 12/9. Denies neck pain.   The history is provided by the patient. No language interpreter was used.  Arm Pain       Prior to Admission medications   Medication Sig Start Date End Date  Taking? Authorizing Provider  gabapentin  (NEURONTIN ) 100 MG capsule Take 1 capsule (100 mg total) by mouth 3 (three) times daily for 2 days, THEN 1 capsule (100 mg total) 2 (two) times daily for 2 days, THEN 1 capsule (100 mg total) daily at 6 (six) AM for 2 days. 12/09/23 12/15/23 Yes Rogelia Jerilynn RAMAN, MD  predniSONE  (DELTASONE ) 10 MG tablet Take 2 tablets (20 mg total) by mouth daily. 12/09/23  Yes Rogelia Jerilynn RAMAN, MD  amitriptyline  (ELAVIL ) 10 MG tablet TAKE 1 TABLET BY MOUTH EVERYDAY AT BEDTIME 08/14/23   McGreal, Inocente HERO, MD  amoxicillin -clavulanate (AUGMENTIN ) 875-125 MG tablet Take 1 tablet by mouth 2 (two) times daily. 10/16/23   Parrett, Madelin RAMAN, NP  aspirin  EC 81 MG tablet Take 1 tablet (81 mg total) by mouth at bedtime. Okay to restart this medication on 07/03/2021 07/02/21   Byrum, Robert S, MD  cabergoline (DOSTINEX) 0.5 MG tablet Take 0.25 mg by mouth 2 (two) times a week. 05/15/23   [provider]  Cholecalciferol (VITAMIN D3) 2000 UNITS TABS Take 2,000 Units by mouth in the morning and at bedtime.    [provider]  Coenzyme Q10 (COQ-10) 100 MG CAPS Take 100 mg by mouth in the morning.    [provider]  finasteride (PROSCAR) 5 MG tablet Take 5 mg by mouth daily. 08/20/22   [provider]  HYDROcodone  bit-homatropine (  HYCODAN) 5-1.5 MG/5ML syrup Take 5 mLs by mouth every 8 (eight) hours as needed for cough. 10/02/23   Geofm Glade PARAS, MD  irbesartan  (AVAPRO ) 75 MG tablet Take 1 tablet (75 mg total) by mouth daily. 09/09/23   Geofm Glade PARAS, MD  nitroGLYCERIN  (NITROSTAT ) 0.4 MG SL tablet Place 1 tablet (0.4 mg total) under the tongue every 5 (five) minutes x 3 doses as needed for chest pain (if no relief after 2nd dose, proceed to ED or call 911). 08/05/23   Debera Jayson MATSU, MD  Omega-3 Fatty Acids (OMEGA-3 FISH OIL PO) Take 1,000 mg by mouth in the morning.    [provider]  omeprazole  (PRILOSEC ) 40 MG capsule Take 1 capsule (40 mg total) by  mouth daily. 07/22/23   Suzann Inocente HERO, MD  polyethylene glycol (MIRALAX / GLYCOLAX) 17 g packet Take 17 g by mouth as needed. 11/28/21   [provider]  rosuvastatin  (CRESTOR ) 20 MG tablet Take 1 tablet (20 mg total) by mouth daily. 09/09/23 12/08/23  Geofm Glade PARAS, MD  sucralfate  (CARAFATE ) 1 g tablet Take 1 tablet (1 g total) by mouth 4 (four) times daily -  with meals and at bedtime. Crush and dissolve in 10 mL warm water prior to swallowing, take 20 min before meals 10/02/23   Geofm Glade PARAS, MD  tamsulosin  (FLOMAX ) 0.4 MG CAPS capsule Take 0.4 mg by mouth in the morning.    [provider]  Testosterone  20.25 MG/ACT (1.62%) GEL Apply 2 Pump topically in the morning. 07/27/19   [provider]  Tiotropium Bromide-Olodaterol (STIOLTO RESPIMAT ) 2.5-2.5 MCG/ACT AERS Inhale 2 puffs into the lungs daily. 10/16/23   Parrett, Madelin RAMAN, NP  Tiotropium Bromide-Olodaterol (STIOLTO RESPIMAT ) 2.5-2.5 MCG/ACT AERS Inhale 2 puffs into the lungs daily. 10/16/23   Parrett, Madelin RAMAN, NP  vitamin C (ASCORBIC ACID) 500 MG tablet Take 500 mg by mouth in the morning.    [provider]  Vitamin E (VITAMIN E/D-ALPHA NATURAL) 268 MG (400 UNIT) CAPS Take 1 tablet by mouth daily.    [provider]    Allergies: Latex, Bromocriptine, Zestril  [lisinopril ], Diovan  [valsartan ], Hydrochlorothiazide , Norvasc  [amlodipine ], and Zocor [simvastatin]    Review of Systems  All other systems reviewed and are negative.   Updated Vital Signs BP (!) 155/85 (BP Location: Right Arm)   Pulse 68   Temp 97.7 F (36.5 C) (Oral)   Resp 18   SpO2 96%   Physical Exam Vitals and nursing note reviewed.  Constitutional:      General: He is not in acute distress.    Appearance: Normal appearance. He is normal weight. He is not ill-appearing, toxic-appearing or diaphoretic.  HENT:     Head: Normocephalic and atraumatic.  Eyes:     Extraocular Movements: Extraocular movements intact.      Pupils: Pupils are equal, round, and reactive to light.  Cardiovascular:     Rate and Rhythm: Normal rate and regular rhythm.     Heart sounds: Normal heart sounds.  Pulmonary:     Effort: Pulmonary effort is normal. No respiratory distress.     Breath sounds: Normal breath sounds.  Abdominal:     General: Abdomen is flat.     Palpations: Abdomen is soft.     Tenderness: There is no abdominal tenderness.  Musculoskeletal:        General: Normal range of motion.     Cervical back: Normal range of motion.     Right lower  leg: No edema.     Left lower leg: No edema.     Comments: No midline tenderness to palpation of the cervical, thoracic, or lumbar spine.  No step-offs, lesions, deformity, or overlying skin changes. Negative Spurling test  Patient with noted paraesthesias throughout the right arm. Has pain and burning sensation with light touch to the volar aspect of the right arm, worse in the proximal aspect and axilla, also has pain/burning sensation to anterior chest wall, borders are the mid axillary line, the sternum medially, and just inferior to the nipple line. No burning or pain to the dorsal aspect of the arm, and does have some sensation to light touch, however paraesthesias noted throughout. No swelling, erythema, warmth, or overlying skin changes. Strong radial pulse. 5/5 strength throughout all movements of the bilateral upper extremities.   Skin:    General: Skin is warm and dry.  Neurological:     General: No focal deficit present.     Mental Status: He is alert.  Psychiatric:        Mood and Affect: Mood normal.        Behavior: Behavior normal.     (all labs ordered are listed, but only abnormal results are displayed) Labs Reviewed  CBC  D-DIMER, QUANTITATIVE  BASIC METABOLIC PANEL WITH GFR  TROPONIN T, HIGH SENSITIVITY  TROPONIN T, HIGH SENSITIVITY    EKG: None  Radiology: DG Chest 2 View Result Date: 12/09/2023 EXAM: 2 VIEW(S) XRAY OF THE CHEST  12/09/2023 01:45:02 PM COMPARISON: 07/25/2023 and 06/02/2022. CLINICAL HISTORY: chest pain FINDINGS: LINES, TUBES AND DEVICES: Right internal jugular port-a-cath is unchanged. LUNGS AND PLEURA: Status post right lobectomy. Stable probable scarring seen in right lung apex. No pleural effusion. No pneumothorax. HEART AND MEDIASTINUM: No acute abnormality of the cardiac and mediastinal silhouettes. BONES AND SOFT TISSUES: Sternotomy wires are noted. No acute osseous abnormality. IMPRESSION: 1. No acute cardiopulmonary process. Electronically signed by: Lynwood Seip MD 12/09/2023 02:03 PM EST RP Workstation: [id number]     Procedures   Medications Ordered in the ED  predniSONE  (DELTASONE ) tablet 40 mg (has no administration in time range)    Clinical Course as of 12/12/23 1211  Tue Dec 09, 2023  1636 Evaluated patient.  Patient reports that he received a flu shot in October, and shortly thereafter developed paresthesias in his right lateral upper arm that have since spread downward towards his forearm and hand, as well as proximally to his chest wall.  Reports that he also has some pain in his right sided chest wall.  Reports that particularly on the volar aspect of the right upper extremity he has pain to light touch when he touches that area with his opposite hand.  On my exam, patient has intact sensation to light touch, though reports subjective paresthesias, denies pain to light touch of the volar aspect of his arm when I touch to his arm in this area, it only produces a sensation of bee stings when he touches this area.  Otherwise hand exam reassuring as per below.  Given associated chest pain, do feel that patient requires chest pain workup including troponins, D-dimer.  If labs are reassuring, feel that patient is likely stable for discharge and outpatient follow-up, he does have a follow-up scheduled with neurology on 12/9 for further workup of this.  Given possible immune/neuropathic component of  his symptoms, will discharge with short course of prednisone  as well as gabapentin  taper. [LS]  1638 INSPECTION & PALPATION: No deformity of  the wrist, hand or fingers.  SENSORY:  superficial radial nerve is intact to light touch with subjective paresthesias median nerve is intact to light touch with subjective paresthesias ulnar nerve is intact to light touch with subjective paresthesias  MOTOR:  posterior interosseous nerve is intact  anterior interosseous nerve is intact  radial nerve is intact  median nerve is intact   ulnar nerve is intact   TENDONS: Thumb FPL/EPL/EPB/APL are intact Index finger FDS/FDP/ED/EI are intact Long finger FDS/FDP/ED are  intact Ring finger FDS/FDP/ED are intact Small finger  FDS/FDP/EDM are intact Wrist FCR/FCU/ECR/ECU are intact  VASCULAR: 2+ radial pulse Capillary refill is <2s COMPARTMENTS: Soft and compressible. No pain with passive stretch. No paresthesias.    [LS]    Clinical Course User Index [LS] Rogelia Jerilynn RAMAN, MD                                 Medical Decision Making Amount and/or Complexity of Data Reviewed Labs: ordered. Radiology: ordered.  Risk Prescription drug management.   This patient is a 78 y.o. male who presents to the ED for concern of right arm pain/chest pain, this involves an extensive number of treatment options, and is a complaint that carries with it a high risk of complications and morbidity. The emergent differential diagnosis prior to evaluation includes, but is not limited to, CIDP, GBS, cervical radiculopathy, neuritis, myositis, thoracic outlet syndrome, CVA, ACS, pericarditis, myocarditis, aortic dissection, PE, pneumothorax, esophageal rupture, pneumonia, reflux/PUD, biliary disease, pancreatitis, costochondritis, anxiety  This is not an exhaustive differential.   Past Medical History / Co-morbidities / Social History:  has a past medical history of Arthritis, CAD (coronary artery disease),  Cancer (HCC), CHF (congestive heart failure) (HCC), COPD (chronic obstructive pulmonary disease) (HCC), Elevated glycated hemoglobin, Essential hypertension, GERD (gastroesophageal reflux disease), Heart murmur, History of radiation therapy, History of radiation therapy, Hyperlipidemia, Hypopituitarism, Lung cancer (HCC), Osteopenia, Pancreatitis, Peyronie disease, Shingles, and Testosterone  deficiency.  Additional history: Chart reviewed.  Physical Exam: Physical exam performed. The pertinent findings include:   No midline tenderness to palpation of the cervical, thoracic, or lumbar spine.  No step-offs, lesions, deformity, or overlying skin changes. Negative Spurling test  Patient with sensation change throughout the right arm. Has pain and burning sensation with light touch to the inner aspect of the right arm, worse in the proximal aspect and axilla, also has pain/burning sensation to anterior chest wall, ends at the sternum and just inferior to the nipple line. No burning or pain to the outer arm, however has numbness/sensation change throughout. No swelling, erythema, warmth, or overlying skin changes. Strong radial pulse. 5/5 strength to bilateral upper extremities.   Lab Tests: I ordered, and personally interpreted labs.  The pertinent results include:  no leukocytosis, d dimer WNL, troponin and BMP pending   Imaging Studies: I ordered imaging studies including CXR. I independently visualized and interpreted imaging which showed NAD. I agree with the radiologist interpretation.   Cardiac Monitoring:  The patient was maintained on a cardiac monitor.  My attending physician Dr. Rogelia viewed and interpreted the cardiac monitored which showed an underlying rhythm of: sinus rhythm, no STEMI. I agree with this interpretation.   Medications: I ordered medication including prednisone   for pain. Reevaluation of the patient after these medicines is pending at shift change.    Disposition:  Patients troponin and BMP are pending at shift change and will determine dispo. Patient  has no signs or symptoms of cellulitis, no weakness or distribution consistent with GBS/CIDP. No indication for LP at this time. He has good pulses, doubt AAA. Dimer is also negative, doubt PE. Considered MRI brain, however patients symptoms seem more consistent with peripheral neuropathic etiology, he is without other neurologic deficits to suspect CVA. No neck or back pain to suspect cervical cause of symptoms. If labs are normal, plan for d/c with prednisone  and gabapentin  for suspected neuropathic pain, close outpatient neurology follow-up at scheduled appointment on 12/9. Suspect he would likely benefit from NCS/EMG. Patient is understanding and in agreement with plan.   Care handoff to Lavanda Lesches, PA-C at shift change. Please see their note for continued evaluation and disposition.   This is a shared visit with supervising physician Dr. Rogelia who has independently evaluated patient & provided guidance in evaluation/management/disposition, in agreement with care   Final diagnoses:  None    ED Discharge Orders          Ordered    gabapentin  (NEURONTIN ) 100 MG capsule  Multiple Frequencies        12/09/23 1453    predniSONE  (DELTASONE ) 10 MG tablet  Daily        12/09/23 1453               Meleana Commerford A, PA-C 12/12/23 1225    Rogelia Jerilynn RAMAN, MD 12/16/23 1258

## 2023-12-09 NOTE — ED Provider Notes (Signed)
 Here with neuropathic pain in RUE Hx of lung cancer has posttreatment is getting workup to make sure he has no recurrence soon. He complains of severe pain in his right arm which he describes as burning under his on the palmar side of his right arm up into the axilla and right chest wall and numbness on the external side of his right arm.  He has no weakness but he has some associated chest pain current workup is to rule out any chest pain ACS issues and he has outpatient neurology follow-up scheduled If workup is negative patient can be discharged on gabapentin  and prednisone  which has been prescribed already. Physical Exam  BP (!) 155/85 (BP Location: Right Arm)   Pulse 68   Temp 97.7 F (36.5 C) (Oral)   Resp 18   SpO2 96%   Physical Exam  Procedures  Procedures  ED Course / MDM   Clinical Course as of 12/09/23 1717  Tue Dec 09, 2023  1636 Evaluated patient.  Patient reports that he received a flu shot in October, and shortly thereafter developed paresthesias in his right lateral upper arm that have since spread downward towards his forearm and hand, as well as proximally to his chest wall.  Reports that he also has some pain in his right sided chest wall.  Reports that particularly on the volar aspect of the right upper extremity he has pain to light touch when he touches that area with his opposite hand.  On my exam, patient has intact sensation to light touch, though reports subjective paresthesias, denies pain to light touch of the volar aspect of his arm when I touch to his arm in this area, it only produces a sensation of bee stings when he touches this area.  Otherwise hand exam reassuring as per below.  Given associated chest pain, do feel that patient requires chest pain workup including troponins, D-dimer.  If labs are reassuring, feel that patient is likely stable for discharge and outpatient follow-up, he does have a follow-up scheduled with neurology on 12/9 for further workup  of this.  Given possible immune/neuropathic component of his symptoms, will discharge with short course of prednisone  as well as gabapentin  taper. [LS]  1638 INSPECTION & PALPATION: No deformity of the wrist, hand or fingers.  SENSORY:  superficial radial nerve is intact to light touch with subjective paresthesias median nerve is intact to light touch with subjective paresthesias ulnar nerve is intact to light touch with subjective paresthesias  MOTOR:  posterior interosseous nerve is intact  anterior interosseous nerve is intact  radial nerve is intact  median nerve is intact   ulnar nerve is intact   TENDONS: Thumb FPL/EPL/EPB/APL are intact Index finger FDS/FDP/ED/EI are intact Long finger FDS/FDP/ED are  intact Ring finger FDS/FDP/ED are intact Small finger  FDS/FDP/EDM are intact Wrist FCR/FCU/ECR/ECU are intact  VASCULAR: 2+ radial pulse Capillary refill is <2s COMPARTMENTS: Soft and compressible. No pain with passive stretch. No paresthesias.    [LS]    Clinical Course User Index [LS] Rogelia Jerilynn RAMAN, MD   Medical Decision Making Amount and/or Complexity of Data Reviewed Labs: ordered. Radiology: ordered.  Risk Prescription drug management.   Patient has had several labs hemolyzed today and does not wish to wait any longer. I discussed his symptoms and took history as well as that physical examination.  Patient reports that the reason he came in today was because he has been having significant difficulty sleeping due to the pain in  his arm.  He does not have any active chest pain at this time states that when he did have problems with his heart had a typical anginal symptoms of pain and pressure with shortness of breath due to exertion which was relieved by rest followed by onset of nonexertional symptoms that led him to go to the ER and that his symptoms are nothing like that.  He denies any exertional chest pain or shortness of breath.  He has reproducible  chest wall pain to palpation on physical examination.  He is tender to palpation in the arm in the areas of his neuropathic complaints.  He has no objective weakness.  I discussed symptoms with Dr. Vanessa.  He did offer that we could do MRI brain with and without given his history of cancer however he does have follow-up with neurology in the next 8 days where they will most likely do EMGs localize the lesion and then order further testing as needed.  I discussed and offered these images to the patient today and he declines stating that he feels comfortable waiting for his neurology follow-up.  He has outpatient gabapentin  and Medrol  ordered.   EKG as likely lead limb reversal but shows no ischemic changes today and I visualized interpreted his two-view chest x-ray which shows no acute abnormalities.  Initial troponin is negative negative dimer and symptoms are not consistent with ACS.  Patient appears appropriate for discharge at this time with outpatient follow-up and strict return precautions.       Arloa Chroman, PA-C 12/09/23 1720    Lenor Hollering, MD 12/09/23 [phone]

## 2023-12-10 ENCOUNTER — Telehealth: Payer: Self-pay

## 2023-12-10 ENCOUNTER — Inpatient Hospital Stay: Attending: Internal Medicine

## 2023-12-10 ENCOUNTER — Other Ambulatory Visit

## 2023-12-10 ENCOUNTER — Ambulatory Visit (HOSPITAL_COMMUNITY)
Admission: RE | Admit: 2023-12-10 | Discharge: 2023-12-10 | Disposition: A | Discharge status: Home / Self Care | Source: Ambulatory Visit | Attending: Internal Medicine | Admitting: Internal Medicine

## 2023-12-10 DIAGNOSIS — C3412 Malignant neoplasm of upper lobe, left bronchus or lung: Secondary | ICD-10-CM | POA: Insufficient documentation

## 2023-12-10 DIAGNOSIS — C3411 Malignant neoplasm of upper lobe, right bronchus or lung: Secondary | ICD-10-CM | POA: Insufficient documentation

## 2023-12-10 DIAGNOSIS — J189 Pneumonia, unspecified organism: Secondary | ICD-10-CM | POA: Insufficient documentation

## 2023-12-10 DIAGNOSIS — Z9221 Personal history of antineoplastic chemotherapy: Secondary | ICD-10-CM | POA: Insufficient documentation

## 2023-12-10 DIAGNOSIS — Z923 Personal history of irradiation: Secondary | ICD-10-CM | POA: Insufficient documentation

## 2023-12-10 DIAGNOSIS — Z902 Acquired absence of lung [part of]: Secondary | ICD-10-CM | POA: Insufficient documentation

## 2023-12-10 DIAGNOSIS — M25511 Pain in right shoulder: Secondary | ICD-10-CM | POA: Insufficient documentation

## 2023-12-10 DIAGNOSIS — C771 Secondary and unspecified malignant neoplasm of intrathoracic lymph nodes: Secondary | ICD-10-CM | POA: Insufficient documentation

## 2023-12-10 DIAGNOSIS — C3490 Malignant neoplasm of unspecified part of unspecified bronchus or lung: Secondary | ICD-10-CM

## 2023-12-10 DIAGNOSIS — R1310 Dysphagia, unspecified: Secondary | ICD-10-CM | POA: Insufficient documentation

## 2023-12-10 DIAGNOSIS — T451X5A Adverse effect of antineoplastic and immunosuppressive drugs, initial encounter: Secondary | ICD-10-CM | POA: Insufficient documentation

## 2023-12-10 DIAGNOSIS — J029 Acute pharyngitis, unspecified: Secondary | ICD-10-CM | POA: Insufficient documentation

## 2023-12-10 DIAGNOSIS — M792 Neuralgia and neuritis, unspecified: Secondary | ICD-10-CM | POA: Insufficient documentation

## 2023-12-10 DIAGNOSIS — G893 Neoplasm related pain (acute) (chronic): Secondary | ICD-10-CM | POA: Insufficient documentation

## 2023-12-10 MED ORDER — SODIUM CHLORIDE (PF) 0.9 % IJ SOLN
INTRAMUSCULAR | Status: AC
  Filled 2023-12-10: qty 50

## 2023-12-10 MED ORDER — HEPARIN SOD (PORK) LOCK FLUSH 100 UNIT/ML IV SOLN
INTRAVENOUS | Status: AC
  Filled 2023-12-10: qty 5

## 2023-12-10 MED ORDER — IOHEXOL 300 MG/ML  SOLN
100.0000 mL | Freq: Once | INTRAMUSCULAR | Status: AC | PRN
  Administered 2023-12-10: 100 mL via INTRAVENOUS

## 2023-12-10 MED ORDER — HEPARIN SOD (PORK) LOCK FLUSH 100 UNIT/ML IV SOLN
500.0000 [IU] | Freq: Once | INTRAVENOUS | Status: AC
  Administered 2023-12-10: 500 [IU] via INTRAVENOUS

## 2023-12-10 NOTE — Telephone Encounter (Signed)
 Spoke with patient regarding CT scan results. Per Cassie, PA, the scan did not show any emergent findings. Dr. Sherrod will review the imaging, and if there are any additional recommendations beyond continuing gabapentin , prednisone , and tylenol , the office will contact the patient with updated instructions. If no further recommendations are needed, the patient will be seen in the office on 12/17/23 to review the scan.  Patient voiced understanding.

## 2023-12-10 NOTE — Progress Notes (Unsigned)
 Spoke with patient in the lobby regarding symptoms of right arm pain. Patient reports he was evaluated in the ED yesterday for the same issue. He describes the pain as a burning sensation with a feeling "like something is crawling under his skin." He states the ED prescribed gabapentin  and prednisone .  Discussed with provider; no additional recommendations at this time. Patient had a CT scan performed today. [facility] and spoke with MJ to request a STAT read for further information. Once results are available, the office will contact the patient with findings and plan of care.  Patient instructed to continue taking gabapentin  and prednisone  as prescribed. Advised he may take Tylenol  as needed. Instructed to return to the ER if symptoms worsen. Patient voiced understanding.

## 2023-12-12 ENCOUNTER — Other Ambulatory Visit: Payer: Self-pay

## 2023-12-12 ENCOUNTER — Ambulatory Visit: Payer: Self-pay

## 2023-12-12 MED ORDER — GABAPENTIN 300 MG PO CAPS: 300.0000 mg | ORAL_CAPSULE | Freq: Three times a day (TID) | ORAL | 3 refills | Status: AC

## 2023-12-12 NOTE — Telephone Encounter (Signed)
Ok for # 300.

## 2023-12-12 NOTE — Telephone Encounter (Signed)
 FYI Only or Action Required?: Action required by provider: update on patient condition and requesting 300mg  gabapentin .  Patient was last seen in primary care on 10/02/2023 by Geofm Glade PARAS, MD.  Called Nurse Triage reporting Arm Pain.  Symptoms began about a month ago.  Interventions attempted: OTC medications: Advil  and Prescription medications: gabapentin .  Symptoms are: unchanged. He states the first 2 days he took 300mg  gabapentin  helped a little.  Triage Disposition: See Today in Office (overriding Go to ED Now (or PCP Triage)) - patient already went to ED on 12/09/23  Patient/caregiver understands and will follow disposition?: No, wishes to speak with PCP- RN advised that a second opinion/follow up with PCP to discuss is advised and patient declined.      Copied from CRM #[id number]. Topic: Clinical - Red Word Triage >> Dec 12, 2023  8:09 AM Thersia BROCKS wrote: [name] that prompted transfer to Nurse Triage: Patient called in stated he is having pain , stated he got a flu shot arm start tingling and hurting, so wants Dr.Burns to call in some pain medication 300mg  gabapentin  (NEURONTIN ) , stated its nerve pain Reason for Disposition  [1] SEVERE pain (e.g., excruciating) AND [2] not improved 2 hours after pain medicine  Answer Assessment - Initial Assessment Questions 1. ONSET: When did the pain start?     1 month ago.  2. LOCATION: Where is the pain located?     Right upper arm from elbow up to shoulder (front and back)  3. PAIN: How bad is the pain? (Scale 0-10; or none, mild, moderate, severe)     8-9/10; took 3 Advil  this morning and 100mg  gabapentin   4. WORK OR EXERCISE: Has there been any recent work or exercise that involved this part of the body?     No.  5. CAUSE: What do you think is causing the arm pain?     Nerve pain. Patient states he thinks either the flu shot or his cancer. He states as of 12/09/33 he was made aware his thoracic cancer tumor had increased  in size. Also he received the flu shot in his right arm the day before symptoms onset.  6. OTHER SYMPTOMS: Do you have any other symptoms? (e.g., neck pain, swelling, rash, fever, numbness, weakness)     Skin on right arm feels like a bee stinging him, numbness in right arm under side  Protocols used: Arm Pain-A-AH

## 2023-12-15 ENCOUNTER — Encounter: Payer: Self-pay | Admitting: Internal Medicine

## 2023-12-16 ENCOUNTER — Encounter: Payer: Self-pay | Admitting: Neurology

## 2023-12-16 ENCOUNTER — Ambulatory Visit: Admitting: Neurology

## 2023-12-16 VITALS — BP 132/72 | HR 86 | Ht 71.75 in | Wt 198.0 lb

## 2023-12-16 DIAGNOSIS — M546 Pain in thoracic spine: Secondary | ICD-10-CM

## 2023-12-16 DIAGNOSIS — M5412 Radiculopathy, cervical region: Secondary | ICD-10-CM

## 2023-12-16 DIAGNOSIS — C3491 Malignant neoplasm of unspecified part of right bronchus or lung: Secondary | ICD-10-CM

## 2023-12-16 NOTE — Progress Notes (Signed)
 Follow-up Visit   Date: 12/16/23   Patrick Harris MRN: [id number] DOB: [birth year]   Interim History: Patrick Harris is a 78 y.o. right-handed Caucasian male with hypertension, hyperlipidemia, GERD, stage III lung cancer, and pituitary adenoma returning to the clinic for follow-up of left leg pain.  The patient was accompanied to the clinic by self.  IMPRESSION/PLAN: Assessment & Plan Right arm pain and burning sensation.  CT shows enlarging right paratracheal nodal mass possibly invading right T2 neuroforamen. Differential includes cancer invasion into nerve roots vs brachial plexus. Gabapentin  started with some relief. - MRI of cervical spine wwo contrast - MRI thoracic spine wwo contrast - Consider MRI brachial plexus and/or EMG, if above imaging is nondiagnostic - Continue gabapentin  300 mg three times a day, titrate as tolerable  Chemotherapy-induced polyneuropathy Neuropathy in feet and hands secondary to chemotherapy.  Further recommendations pending results.   History of present illness: Starting around 3-4 years, he began having cramp involving the medial left leg, which resolved with gabapentin .  He also has burning sensation over the medial knee, medial lower leg, and into the great toe.  This pain is constant.  There is not thing that provides any relief or exacerbates his pain.  He does not have similar symptoms in the right foot. He has seen Vascular surgery whose evaluated showed mild PAD, but did not feel that his symptoms were related to this.  He has also seen Dr. Colon at [facility] who performed MRI cervical spine and lumbar spine. I will request these reports.  His MRI lumbar spine did not seems to show any nerve impingement to explain his symptoms.   Retired personnel officer.  He lives with wife.  His daughter passed away in 01/20/2019 from breast cancer.   UPDATE 01/30/2021:   He was started on gabapentin  300mg  BID for his left leg pain at his last  visit. He reports no change. He continues to have burning and tingling involving her medial lower leg and into her foot. No weakness.  He also complains of left hand in tingling and numbness over the dorsum of the thumb and hand.  No weakness.  Symptoms started several months ago when he noticed growth over the left forearm. He has since had the skin lesion resected which was diagnosed as squamous cell cancer.  He also reports having episodic muscle twitches when he is very drowsy and sleeping.   UPDATE 10/01/2021:  He has been out of gabapentin  for the past 2 weeks and did not appreciated that it helped left leg paresthesias when he was taking it, nor once he has been off it.  Left forearm tingling has resolved.   He was diagnosed with non-small cell lung cancer in May after requesting screening CT scan for surveillance.  He is going to [facility] oncology for lung cancer and has started chemotherapy. He does not report any new numbness/tingling since starting chemotherapy.    UPDATE 12/16/2023:  He is here for follow-up visit.  Discussed the use of AI scribe software for clinical note transcription with the patient, who gave verbal consent to proceed.  History of Present Illness Patrick Harris is a 78 year old male with lung cancer and chemotherapy-induced neuropathy who presents with burning and stinging pain in the right arm.  He has a history of lung cancer, for which he underwent partial lung resection and subsequent treatments. Initially, the treatments were effective for some nodules, but a thoracic nodule doubled in size despite  treatment.   He developed neuropathy in his feet and hands as a result of chemotherapy. He sought treatment from a chiropractor using light therapy and electrical stimulation, but it was ineffective. He describes the neuropathy as 'pins and needles' and 'buzzing around.'   He received a flu shot in his right arm a few weeks ago and subsequently experienced a sensation of  'something crawling' up and down the arm, followed by burning and stinging pain. The pain is described as 'like bees stinging' and is accompanied by deep, toothache-like pain around the elbow, chest, and back. The burning and stinging are localized under the arm and extend downwards. No weakness in the arm and no similar symptoms in the left arm. He is currently taking gabapentin  300 mg three times a day, which has provided some relief, allowing him to sleep better.  CT chest from 12/5 shows enlarged right paratracheal nodal mass with possible invasion into the right T2 neural foramen.    Medications:  Current Outpatient Medications on File Prior to Visit  Medication Sig Dispense Refill   amitriptyline  (ELAVIL ) 10 MG tablet TAKE 1 TABLET BY MOUTH EVERYDAY AT BEDTIME 30 tablet 5   aspirin  EC 81 MG tablet Take 1 tablet (81 mg total) by mouth at bedtime. Okay to restart this medication on 07/03/2021 30 tablet 11   cabergoline (DOSTINEX) 0.5 MG tablet Take 0.25 mg by mouth 2 (two) times a week.     Cholecalciferol (VITAMIN D3) 2000 UNITS TABS Take 2,000 Units by mouth in the morning and at bedtime.     Coenzyme Q10 (COQ-10) 100 MG CAPS Take 100 mg by mouth in the morning.     finasteride (PROSCAR) 5 MG tablet Take 5 mg by mouth daily.     gabapentin  (NEURONTIN ) 300 MG capsule Take 1 capsule (300 mg total) by mouth 3 (three) times daily. 90 capsule 3   irbesartan  (AVAPRO ) 75 MG tablet Take 1 tablet (75 mg total) by mouth daily. 90 tablet 3   nitroGLYCERIN  (NITROSTAT ) 0.4 MG SL tablet Place 1 tablet (0.4 mg total) under the tongue every 5 (five) minutes x 3 doses as needed for chest pain (if no relief after 2nd dose, proceed to ED or call 911). 25 tablet 3   Omega-3 Fatty Acids (OMEGA-3 FISH OIL PO) Take 1,000 mg by mouth in the morning.     omeprazole  (PRILOSEC ) 40 MG capsule Take 1 capsule (40 mg total) by mouth daily. 90 capsule 3   polyethylene glycol (MIRALAX / GLYCOLAX) 17 g packet Take 17 g by  mouth as needed.     rosuvastatin  (CRESTOR ) 20 MG tablet Take 1 tablet (20 mg total) by mouth daily. 90 tablet 3   tamsulosin  (FLOMAX ) 0.4 MG CAPS capsule Take 0.4 mg by mouth in the morning.     Testosterone  20.25 MG/ACT (1.62%) GEL Apply 2 Pump topically in the morning.     vitamin C (ASCORBIC ACID) 500 MG tablet Take 500 mg by mouth in the morning.     Vitamin E (VITAMIN E/D-ALPHA NATURAL) 268 MG (400 UNIT) CAPS Take 1 tablet by mouth daily.     amoxicillin -clavulanate (AUGMENTIN ) 875-125 MG tablet Take 1 tablet by mouth 2 (two) times daily. (Patient not taking: Reported on 12/16/2023) 14 tablet 0   gabapentin  (NEURONTIN ) 100 MG capsule Take 1 capsule (100 mg total) by mouth 3 (three) times daily for 2 days, THEN 1 capsule (100 mg total) 2 (two) times daily for 2 days, THEN 1 capsule (100  mg total) daily at 6 (six) AM for 2 days. (Patient not taking: No sig reported) 12 capsule 0   HYDROcodone  bit-homatropine (HYCODAN) 5-1.5 MG/5ML syrup Take 5 mLs by mouth every 8 (eight) hours as needed for cough. (Patient not taking: Reported on 12/16/2023) 120 mL 0   predniSONE  (DELTASONE ) 10 MG tablet Take 2 tablets (20 mg total) by mouth daily. (Patient not taking: Reported on 12/16/2023) 4 tablet 0   sucralfate  (CARAFATE ) 1 g tablet Take 1 tablet (1 g total) by mouth 4 (four) times daily -  with meals and at bedtime. Crush and dissolve in 10 mL warm water prior to swallowing, take 20 min before meals (Patient not taking: Reported on 12/16/2023) 120 tablet 0   Tiotropium Bromide-Olodaterol (STIOLTO RESPIMAT ) 2.5-2.5 MCG/ACT AERS Inhale 2 puffs into the lungs daily. (Patient not taking: Reported on 12/16/2023) 1 each 5   Tiotropium Bromide-Olodaterol (STIOLTO RESPIMAT ) 2.5-2.5 MCG/ACT AERS Inhale 2 puffs into the lungs daily. (Patient not taking: Reported on 12/16/2023)     No current facility-administered medications on file prior to visit.    Allergies:  Allergies  Allergen Reactions   Latex Dermatitis     Contact  dermatitis   Bromocriptine Nausea Only    Dry heaves   Zestril  [Lisinopril ] Other (See Comments)    Leg pain, inc HR, dizziness, headache   Diovan  [Valsartan ] Nausea Only   Hydrochlorothiazide  Other (See Comments)    Possibly caused pancreatitis 2018   Norvasc  [Amlodipine ] Other (See Comments)    Edema    Zocor [Simvastatin]     Joint pain    Vital Signs:  BP (!) 148/86   Pulse 86   Ht 5' 11.75 (1.822 m)   Wt 198 lb (89.8 kg)   SpO2 99%   BMI 27.04 kg/m    Neurological Exam: MENTAL STATUS including orientation to time, place, person, recent and remote memory, attention span and concentration, language, and fund of knowledge is normal.  Speech is not dysarthric.  CRANIAL NERVES:  Pupils equal round and reactive to light.  Normal conjugate, extra-ocular eye movements in all directions of gaze.  No ptosis.  Face is symmetric. Palate elevates symmetrically.  Tongue is midline.  MOTOR:  No atrophy, fasciculations or abnormal movements.  No pronator drift.   Upper Extremity:  Right  Left  Deltoid  5/5   5/5   Biceps  5/5   5/5   Triceps  5/5   5/5   Infraspinatus 5/5  5/5  Medial pectoralis 5/5  5/5  Wrist extensors  5/5   5/5   Wrist flexors  5/5   5/5   Finger extensors  5/5   5/5   Finger flexors  5/5   5/5   Dorsal interossei  5/5   5/5   Abductor pollicis  5/5   5/5   Tone (Ashworth scale)  0  0   Lower Extremity:  Right  Left  Hip flexors  5/5   5/5   Knee flexors  5/5   5/5   Knee extensors  5/5   5/5   Dorsiflexors  5/5   5/5   Plantarflexors  5/5   5/5   Toe extensors  5/5   5/5   Toe flexors  5/5   5/5   Tone (Ashworth scale)  0  0   MSRs:  Right        Left brachioradialis 0  2+  biceps 0  2+  triceps 0  2+  patellar 1+  1+  ankle jerk 0  0  plantar response down  down   SENSORY:  Intact to vibration throughout.  Pin prick and temperature is reduced diffusely over the right upper arm, forearm, and  hand.  He has reduced pin prick along the right T2 dermatome.    COORDINATION/GAIT:  Normal finger-to- nose-finger.  Intact rapid alternating movements bilaterally.  Gait narrow based and stable.   Data:  MRI brain wwo contrast 01/30/2023:   No evidence of intracranial metastases.   CT chest wwo contrast 12/10/2023: 1. Enlarging high right paratracheal nodal mass. Difficult to exclude invasion of the adjacent right T2 neural foramen. 2. Possible mild basilar interstitial lung abnormality. 3. Enlarged prostate. 4. Aortic atherosclerosis (ICD10-I70.0). Coronary artery calcification. 5.  Emphysema (ICD10-J43.9).  Total time spent reviewing records, interview, history/exam, documentation, and coordination of care on day of encounter:  40 minutes   Thank you for allowing me to participate in patient's care.  If I can answer any additional questions, I would be pleased to do so.    Sincerely,    [place] Karl K. Tobie, DO

## 2023-12-16 NOTE — Progress Notes (Signed)
 Prior Authorization submitted through [facility].   MRI C-Spine W/ W/O. Approved. #[id number]  MRI T-Spine W/ W/O. Approved. #[id number]

## 2023-12-16 NOTE — Patient Instructions (Addendum)
 MRI cervical spine and thoracic spine

## 2023-12-17 ENCOUNTER — Ambulatory Visit: Admitting: Internal Medicine

## 2023-12-17 VITALS — BP 131/70 | HR 81 | Temp 98.6°F | Resp 17 | Ht 71.75 in | Wt 195.9 lb

## 2023-12-17 DIAGNOSIS — C3490 Malignant neoplasm of unspecified part of unspecified bronchus or lung: Secondary | ICD-10-CM | POA: Diagnosis not present

## 2023-12-17 DIAGNOSIS — T451X5A Adverse effect of antineoplastic and immunosuppressive drugs, initial encounter: Secondary | ICD-10-CM | POA: Diagnosis not present

## 2023-12-17 DIAGNOSIS — Z923 Personal history of irradiation: Secondary | ICD-10-CM | POA: Diagnosis not present

## 2023-12-17 DIAGNOSIS — C3411 Malignant neoplasm of upper lobe, right bronchus or lung: Secondary | ICD-10-CM | POA: Diagnosis present

## 2023-12-17 DIAGNOSIS — J189 Pneumonia, unspecified organism: Secondary | ICD-10-CM | POA: Diagnosis not present

## 2023-12-17 DIAGNOSIS — M792 Neuralgia and neuritis, unspecified: Secondary | ICD-10-CM | POA: Diagnosis not present

## 2023-12-17 DIAGNOSIS — Z9221 Personal history of antineoplastic chemotherapy: Secondary | ICD-10-CM | POA: Diagnosis not present

## 2023-12-17 DIAGNOSIS — C771 Secondary and unspecified malignant neoplasm of intrathoracic lymph nodes: Secondary | ICD-10-CM | POA: Diagnosis not present

## 2023-12-17 DIAGNOSIS — G893 Neoplasm related pain (acute) (chronic): Secondary | ICD-10-CM | POA: Diagnosis not present

## 2023-12-17 DIAGNOSIS — M25511 Pain in right shoulder: Secondary | ICD-10-CM | POA: Diagnosis not present

## 2023-12-17 DIAGNOSIS — Z902 Acquired absence of lung [part of]: Secondary | ICD-10-CM | POA: Diagnosis not present

## 2023-12-17 DIAGNOSIS — R1310 Dysphagia, unspecified: Secondary | ICD-10-CM | POA: Diagnosis not present

## 2023-12-17 DIAGNOSIS — C3412 Malignant neoplasm of upper lobe, left bronchus or lung: Secondary | ICD-10-CM | POA: Diagnosis not present

## 2023-12-17 DIAGNOSIS — J029 Acute pharyngitis, unspecified: Secondary | ICD-10-CM | POA: Diagnosis not present

## 2023-12-17 MED ORDER — OXYCODONE-ACETAMINOPHEN 10-325 MG PO TABS: 1.0000 | ORAL_TABLET | Freq: Three times a day (TID) | ORAL | 0 refills | Status: DC | PRN

## 2023-12-17 NOTE — Progress Notes (Signed)
 [facility] Telephone:([phone]) [phone]   Fax:([phone]  OFFICE PROGRESS NOTE  Geofm Glade PARAS, MD [address] [place] [address]  DIAGNOSIS:  Stage IIIA (T1c, N 2, M0) adenocarcinoma in the right upper lobe as well as stage IA (T1c, N0, M0) squamous cell carcinoma involving the right upper lobe and stage IA (T1b, N0, M0) adenocarcinoma involving the left upper lobe diagnosed in June 2023.  PRIOR THERAPY:  1) status post neoadjuvant systemic chemotherapy with carboplatin , paclitaxel  and nivolumab  for 3 cycles in [place]  followed by right upper lobectomy with lymph node dissection at [facility] under the care of Dr. Valli with the final pathology showing residual disease with synchronous adenocarcinoma as well as squamous cell carcinoma in the right upper lobe and multiple mediastinal lymph node involvement performed on November 13, 2021. 2)  evidence for disease recurrence with involvement of 4R lymphadenopathy consistent with metastatic adenocarcinoma on February 26, 2022 biopsy. Molecular studies showed no actionable mutations and PD-L1 expression was 15% in the adenocarcinoma in 2% in the squamous cell carcinoma. 3) Concurrent chemotherapy with carboplatin  for AUC of 5 and Alimta  500 Mg/M2 every 3 weeks for 2 cycles during the course of radiotherapy.  Last dose was giving 04/08/2022. 4) Consolidation treatment with immunotherapy with Imfinzi  1500 Mg IV every 4 weeks.  First dose May 29, 2022 status post 1 cycle.  This treatment was discontinued secondary to intolerance 5) palliative radiotherapy to the recurrent disease right paratracheal lymph node under the care of Dr. Shannon completed March 17, 2023.  CURRENT THERAPY: Observation  INTERVAL HISTORY: Patrick Harris 78 y.o. male returns to the clinic today for follow-up visit accompanied by his wife. Discussed the use of AI scribe software for clinical note transcription with the patient, who  gave verbal consent to proceed.  History of Present Illness Patrick Harris is a 78 year old male with stage IIIA non-small cell lung cancer who presents with pain and concerns about disease progression. He is accompanied by his wife.  He has a history of stage IIIA non-small cell lung cancer, initially diagnosed as stage IA adenocarcinoma in June 2023. He underwent neoadjuvant systemic chemo-immunotherapy but experienced disease recurrence in the 4R lymphadenosine in February 2012, leading to discontinuation of treatment due to intolerance. He subsequently received palliative radiotherapy to the recurrent disease in the right paratracheal lymph node, completed in March 2025. He is currently under observation and presented for a repeat CT scan of the chest, abdomen, and pelvis for restaging of his disease.  He experiences significant pain on the right side, describing sensations of 'things crawling' on his arm, burning like 'bee stings', and needle-like pain upon touch. These symptoms began approximately a month ago after receiving a flu shot. He also has a dry cough and a tickle in his throat. He visited a neurologist who ordered MRIs to investigate these symptoms further.  He reports pain and numbness in his right arm and shoulder, and notes that the right paratracheal lymph node was previously treated with radiation. He describes the pain as 'terrible', akin to a toothache, extending to the center and backside.  He is currently taking gabapentin  for pain management, which he obtained from another physician. He also mentions previous intolerance to immune therapy due to stomach pain, for which he was prescribed amitriptyline  by a gastroenterologist, which helped alleviate the stomach issues.     MEDICAL HISTORY: Past Medical History:  Diagnosis Date  Arthritis    CAD (coronary artery disease)    CABG with LIMA to LAD and RIMA to RCA August 2000 - Dr. Kerrin   Cancer [facility])    basal  cell   CHF (congestive heart failure) (HCC)    COPD (chronic obstructive pulmonary disease) (HCC)    Elevated glycated hemoglobin    Essential hypertension    GERD (gastroesophageal reflux disease)    Heart murmur    History of radiation therapy    Right lung(chest) 03/19/22-05/06/22- Dr. Lynwood Nasuti   History of radiation therapy    Right Lung- 02/25/23-03/17/23- Dr. Lynwood Nasuti   Hyperlipidemia    Hypopituitarism    Lung cancer [facility])    Osteopenia    Dr Loetta   Pancreatitis    Peyronie disease    Shingles    Testosterone  deficiency    Dr Loetta    ALLERGIES:  is allergic to latex, bromocriptine, zestril  Emir.erichsen ], diovan  Higinio.hane ], hydrochlorothiazide , norvasc  [amlodipine ], and zocor [simvastatin].  MEDICATIONS:  Current Outpatient Medications  Medication Sig Dispense Refill   amitriptyline  (ELAVIL ) 10 MG tablet TAKE 1 TABLET BY MOUTH EVERYDAY AT BEDTIME 30 tablet 5   amoxicillin -clavulanate (AUGMENTIN ) 875-125 MG tablet Take 1 tablet by mouth 2 (two) times daily. (Patient not taking: Reported on 12/16/2023) 14 tablet 0   aspirin  EC 81 MG tablet Take 1 tablet (81 mg total) by mouth at bedtime. Okay to restart this medication on 07/03/2021 30 tablet 11   cabergoline (DOSTINEX) 0.5 MG tablet Take 0.25 mg by mouth 2 (two) times a week.     Cholecalciferol (VITAMIN D3) 2000 UNITS TABS Take 2,000 Units by mouth in the morning and at bedtime.     Coenzyme Q10 (COQ-10) 100 MG CAPS Take 100 mg by mouth in the morning.     finasteride (PROSCAR) 5 MG tablet Take 5 mg by mouth daily.     gabapentin  (NEURONTIN ) 100 MG capsule Take 1 capsule (100 mg total) by mouth 3 (three) times daily for 2 days, THEN 1 capsule (100 mg total) 2 (two) times daily for 2 days, THEN 1 capsule (100 mg total) daily at 6 (six) AM for 2 days. (Patient not taking: No sig reported) 12 capsule 0   gabapentin  (NEURONTIN ) 300 MG capsule Take 1 capsule (300 mg total) by mouth 3 (three) times daily. 90 capsule 3    HYDROcodone  bit-homatropine (HYCODAN) 5-1.5 MG/5ML syrup Take 5 mLs by mouth every 8 (eight) hours as needed for cough. (Patient not taking: Reported on 12/16/2023) 120 mL 0   irbesartan  (AVAPRO ) 75 MG tablet Take 1 tablet (75 mg total) by mouth daily. 90 tablet 3   nitroGLYCERIN  (NITROSTAT ) 0.4 MG SL tablet Place 1 tablet (0.4 mg total) under the tongue every 5 (five) minutes x 3 doses as needed for chest pain (if no relief after 2nd dose, proceed to ED or call 911). 25 tablet 3   Omega-3 Fatty Acids (OMEGA-3 FISH OIL PO) Take 1,000 mg by mouth in the morning.     omeprazole  (PRILOSEC ) 40 MG capsule Take 1 capsule (40 mg total) by mouth daily. 90 capsule 3   polyethylene glycol (MIRALAX / GLYCOLAX) 17 g packet Take 17 g by mouth as needed.     predniSONE  (DELTASONE ) 10 MG tablet Take 2 tablets (20 mg total) by mouth daily. (Patient not taking: Reported on 12/16/2023) 4 tablet 0   rosuvastatin  (CRESTOR ) 20 MG tablet Take 1 tablet (20 mg total) by mouth daily. 90 tablet 3  sucralfate  (CARAFATE ) 1 g tablet Take 1 tablet (1 g total) by mouth 4 (four) times daily -  with meals and at bedtime. Crush and dissolve in 10 mL warm water prior to swallowing, take 20 min before meals (Patient not taking: Reported on 12/16/2023) 120 tablet 0   tamsulosin  (FLOMAX ) 0.4 MG CAPS capsule Take 0.4 mg by mouth in the morning.     Testosterone  20.25 MG/ACT (1.62%) GEL Apply 2 Pump topically in the morning.     Tiotropium Bromide-Olodaterol (STIOLTO RESPIMAT ) 2.5-2.5 MCG/ACT AERS Inhale 2 puffs into the lungs daily. (Patient not taking: Reported on 12/16/2023) 1 each 5   Tiotropium Bromide-Olodaterol (STIOLTO RESPIMAT ) 2.5-2.5 MCG/ACT AERS Inhale 2 puffs into the lungs daily. (Patient not taking: Reported on 12/16/2023)     vitamin C (ASCORBIC ACID) 500 MG tablet Take 500 mg by mouth in the morning.     Vitamin E (VITAMIN E/D-ALPHA NATURAL) 268 MG (400 UNIT) CAPS Take 1 tablet by mouth daily.     No current  facility-administered medications for this visit.    SURGICAL HISTORY:  Past Surgical History:  Procedure Laterality Date   Bone spur  2006   5th toe bilaterally   BRONCHIAL BIOPSY  07/02/2021   Procedure: BRONCHIAL BIOPSIES;  Surgeon: Shelah Lamar RAMAN, MD;  Location: [facility] ENDOSCOPY;  Service: Pulmonary;;   BRONCHIAL BRUSHINGS  07/02/2021   Procedure: BRONCHIAL BRUSHINGS;  Surgeon: Shelah Lamar RAMAN, MD;  Location: [name] ENDOSCOPY;  Service: Pulmonary;;   BRONCHIAL NEEDLE ASPIRATION BIOPSY  07/02/2021   Procedure: BRONCHIAL NEEDLE ASPIRATION BIOPSIES;  Surgeon: Shelah Lamar RAMAN, MD;  Location: [facility] ENDOSCOPY;  Service: Pulmonary;;   COLONOSCOPY  2007   negative; Dr Jakie   CORONARY ARTERY BYPASS GRAFT  2001   2 vessels   FIDUCIAL MARKER PLACEMENT  07/02/2021   Procedure: FIDUCIAL MARKER PLACEMENT;  Surgeon: Shelah Lamar RAMAN, MD;  Location: [facility] ENDOSCOPY;  Service: Pulmonary;;   fracture heel  1967   Bil/ due to fall off ladder   Fractured calcaneus  1969   bilaterally w/ ankle fusion   HERNIA REPAIR  1984   right inguinal hernia   KNEE SURGERY Left    LOBECTOMY Right 11/13/2021   MENISECTOMY  2007   R medial, Dr. Barbarann   PORTA CATH INSERTION     SHOULDER SURGERY  1994   left   UPPER GASTROINTESTINAL ENDOSCOPY  2007   GERD, Dr Jakie   VASECTOMY     VIDEO BRONCHOSCOPY WITH RADIAL ENDOBRONCHIAL ULTRASOUND  07/02/2021   Procedure: VIDEO BRONCHOSCOPY WITH RADIAL ENDOBRONCHIAL ULTRASOUND;  Surgeon: Shelah Lamar RAMAN, MD;  Location: [facility] ENDOSCOPY;  Service: Pulmonary;;    REVIEW OF SYSTEMS:  Constitutional: negative Eyes: negative Ears, nose, mouth, throat, and face: negative Respiratory: negative Cardiovascular: negative Gastrointestinal: negative Genitourinary:negative Integument/breast: negative Hematologic/lymphatic: negative Musculoskeletal:positive for bone pain and neck pain Neurological: negative Behavioral/Psych: negative Endocrine: negative Allergic/Immunologic:  negative   PHYSICAL EXAMINATION: General appearance: alert, cooperative, fatigued, and no distress Head: Normocephalic, without obvious abnormality, atraumatic Neck: no adenopathy, no JVD, supple, symmetrical, trachea midline, and thyroid  not enlarged, symmetric, no tenderness/mass/nodules Lymph nodes: Cervical, supraclavicular, and axillary nodes normal. Resp: clear to auscultation bilaterally Back: symmetric, no curvature. ROM normal. No CVA tenderness. Cardio: regular rate and rhythm, S1, S2 normal, no murmur, click, rub or gallop GI: soft, non-tender; bowel sounds normal; no masses,  no organomegaly Extremities: extremities normal, atraumatic, no cyanosis or edema Neurologic: Alert and oriented X 3, normal strength and tone. Normal symmetric reflexes.  Normal coordination and gait  ECOG PERFORMANCE STATUS: 1 - Symptomatic but completely ambulatory  Blood pressure 131/70, pulse 81, temperature 98.6 F (37 C), temperature source Temporal, resp. rate 17, height 5' 11.75 (1.822 m), weight 195 lb 14.4 oz (88.9 kg), SpO2 99%.  LABORATORY DATA: Lab Results  Component Value Date   WBC 4.7 12/09/2023   HGB 16.8 12/09/2023   HCT 49.1 12/09/2023   MCV 93.9 12/09/2023   PLT 161 12/09/2023      Chemistry      Component Value Date/Time   NA 138 12/09/2023 1615   K 4.5 12/09/2023 1615   CL 103 12/09/2023 1615   CO2 25 12/09/2023 1615   BUN 16 12/09/2023 1615   CREATININE 0.93 12/09/2023 1615   CREATININE 1.00 07/25/2023 0853   CREATININE 1.07 08/25/2019 0847      Component Value Date/Time   CALCIUM  9.5 12/09/2023 1615   ALKPHOS 58 07/25/2023 0853   AST 18 07/25/2023 0853   ALT 14 07/25/2023 0853   BILITOT 0.6 07/25/2023 0853       RADIOGRAPHIC STUDIES: CT Chest W Contrast Result Date: 12/10/2023 CLINICAL DATA:  Non-small cell lung cancer.  * Tracking Code: BO * EXAM: CT CHEST, ABDOMEN, AND PELVIS WITH CONTRAST TECHNIQUE: Multidetector CT imaging of the chest, abdomen and  pelvis was performed following the standard protocol during bolus administration of intravenous contrast. RADIATION DOSE REDUCTION: This exam was performed according to the departmental dose-optimization program which includes automated exposure control, adjustment of the mA and/or kV according to patient size and/or use of iterative reconstruction technique. CONTRAST:  OMNIPAQUE  IOHEXOL  300 MG/ML  SOLN COMPARISON:  07/25/2023. FINDINGS: CT CHEST FINDINGS Cardiovascular: Right IJ Port-A-Cath terminates in the low SVC. Atherosclerotic calcification of the aorta, aortic valve and coronary arteries. Heart size normal. No pericardial effusion. Mediastinum/Nodes: Enlarging high right paratracheal nodal mass measures 2.8 x 3.8 cm (2/6), previously 1.1 x 1.5 cm. There may be invasion of the adjacent T2 right neural foramen (2/4). No additional pathologically enlarged mediastinal, hilar or axillary lymph nodes. Esophagus is grossly unremarkable. Lungs/Pleura: Centrilobular and paraseptal emphysema. Right upper lobectomy. Perihilar post treatment pulmonary retraction, bronchiectasis and architectural distortion in the right lung, as before. Possible mild basilar subpleural coarsening ground-glass and reticulation. No pleural fluid. Minimal adherent debris in the airway. Musculoskeletal: Degenerative changes in the spine. No worrisome lytic or sclerotic lesions. CT ABDOMEN PELVIS FINDINGS Hepatobiliary: Liver and gallbladder are unremarkable. No biliary ductal dilatation. Pancreas: Negative. Spleen: Negative. Adrenals/Urinary Tract: Adrenal glands are unremarkable. Subcentimeter low-attenuation lesions in the kidneys are too small to characterize. No specific follow-up necessary. Kidneys are otherwise unremarkable. Ureters are decompressed. An enlarged prostate indents the bladder. Stomach/Bowel: Stomach, small bowel, appendix and colon are unremarkable. Vascular/Lymphatic: Atherosclerotic calcification of the aorta. No  pathologically enlarged lymph nodes. Reproductive: Prostate is enlarged and indents the bladder. Other: Small bilateral inguinal hernias contain fat. No free fluid. Mesenteries and peritoneum are unremarkable. Musculoskeletal: Degenerative changes in the spine. No worrisome lytic or sclerotic lesions. IMPRESSION: 1. Enlarging high right paratracheal nodal mass. Difficult to exclude invasion of the adjacent right T2 neural foramen. 2. Possible mild basilar interstitial lung abnormality. 3. Enlarged prostate. 4. Aortic atherosclerosis (ICD10-I70.0). Coronary artery calcification. 5.  Emphysema (ICD10-J43.9). Electronically Signed   By: Newell Eke M.D.   On: 12/10/2023 11:32   CT ABDOMEN PELVIS W CONTRAST Result Date: 12/10/2023 CLINICAL DATA:  Non-small cell lung cancer.  * Tracking Code: BO * EXAM: CT CHEST, ABDOMEN, AND PELVIS  WITH CONTRAST TECHNIQUE: Multidetector CT imaging of the chest, abdomen and pelvis was performed following the standard protocol during bolus administration of intravenous contrast. RADIATION DOSE REDUCTION: This exam was performed according to the departmental dose-optimization program which includes automated exposure control, adjustment of the mA and/or kV according to patient size and/or use of iterative reconstruction technique. CONTRAST:  OMNIPAQUE  IOHEXOL  300 MG/ML  SOLN COMPARISON:  07/25/2023. FINDINGS: CT CHEST FINDINGS Cardiovascular: Right IJ Port-A-Cath terminates in the low SVC. Atherosclerotic calcification of the aorta, aortic valve and coronary arteries. Heart size normal. No pericardial effusion. Mediastinum/Nodes: Enlarging high right paratracheal nodal mass measures 2.8 x 3.8 cm (2/6), previously 1.1 x 1.5 cm. There may be invasion of the adjacent T2 right neural foramen (2/4). No additional pathologically enlarged mediastinal, hilar or axillary lymph nodes. Esophagus is grossly unremarkable. Lungs/Pleura: Centrilobular and paraseptal emphysema. Right upper  lobectomy. Perihilar post treatment pulmonary retraction, bronchiectasis and architectural distortion in the right lung, as before. Possible mild basilar subpleural coarsening ground-glass and reticulation. No pleural fluid. Minimal adherent debris in the airway. Musculoskeletal: Degenerative changes in the spine. No worrisome lytic or sclerotic lesions. CT ABDOMEN PELVIS FINDINGS Hepatobiliary: Liver and gallbladder are unremarkable. No biliary ductal dilatation. Pancreas: Negative. Spleen: Negative. Adrenals/Urinary Tract: Adrenal glands are unremarkable. Subcentimeter low-attenuation lesions in the kidneys are too small to characterize. No specific follow-up necessary. Kidneys are otherwise unremarkable. Ureters are decompressed. An enlarged prostate indents the bladder. Stomach/Bowel: Stomach, small bowel, appendix and colon are unremarkable. Vascular/Lymphatic: Atherosclerotic calcification of the aorta. No pathologically enlarged lymph nodes. Reproductive: Prostate is enlarged and indents the bladder. Other: Small bilateral inguinal hernias contain fat. No free fluid. Mesenteries and peritoneum are unremarkable. Musculoskeletal: Degenerative changes in the spine. No worrisome lytic or sclerotic lesions. IMPRESSION: 1. Enlarging high right paratracheal nodal mass. Difficult to exclude invasion of the adjacent right T2 neural foramen. 2. Possible mild basilar interstitial lung abnormality. 3. Enlarged prostate. 4. Aortic atherosclerosis (ICD10-I70.0). Coronary artery calcification. 5.  Emphysema (ICD10-J43.9). Electronically Signed   By: Newell Eke M.D.   On: 12/10/2023 11:32   DG Chest 2 View Result Date: 12/09/2023 EXAM: 2 VIEW(S) XRAY OF THE CHEST 12/09/2023 01:45:02 PM COMPARISON: 07/25/2023 and 06/02/2022. CLINICAL HISTORY: chest pain FINDINGS: LINES, TUBES AND DEVICES: Right internal jugular port-a-cath is unchanged. LUNGS AND PLEURA: Status post right lobectomy. Stable probable scarring seen in  right lung apex. No pleural effusion. No pneumothorax. HEART AND MEDIASTINUM: No acute abnormality of the cardiac and mediastinal silhouettes. BONES AND SOFT TISSUES: Sternotomy wires are noted. No acute osseous abnormality. IMPRESSION: 1. No acute cardiopulmonary process. Electronically signed by: Lynwood Seip MD 12/09/2023 02:03 PM EST RP Workstation: [id number]    ASSESSMENT AND PLAN: This is a very pleasant 78 years old white male with Stage IIIA (T1c, N 2, M0) adenocarcinoma in the right upper lobe as well as stage IA (T1c, N0, M0) squamous cell carcinoma involving the right upper lobe and stage IA (T1b, N0, M0) adenocarcinoma involving the left upper lobe diagnosed in June 2023. The patient is status post the following treatment: 1) status post neoadjuvant systemic chemotherapy with carboplatin , paclitaxel  and nivolumab  for 3 cycles in [place]  followed by right upper lobectomy with lymph node dissection at [facility] under the care of Dr. Valli with the final pathology showing residual disease with synchronous adenocarcinoma as well as squamous cell carcinoma in the right upper lobe and multiple mediastinal lymph node involvement performed on November 13, 2021. 2)  evidence for  disease recurrence with involvement of 4R lymphadenopathy consistent with metastatic adenocarcinoma on February 26, 2022 biopsy. Molecular studies showed no actionable mutations and PD-L1 expression was 15% in the adenocarcinoma in 2% in the squamous cell carcinoma. 3) Concurrent chemotherapy with carboplatin  for AUC of 5 and Alimta  500 Mg/M2 every 3 weeks for 2 cycles during the course of radiotherapy.  Last dose was giving 04/08/2022. He tolerated his treatment well except for the chemotherapy-induced odynophagia in addition to fatigue and sore throat. He started treatment with consolidation immunotherapy with Imfinzi  1500 Mg IV every 4 weeks status post 1 cycle.  This was discontinued secondary to intolerance  and severe diarrhea managed by his gastroenterologist Dr. Aneita. He underwent palliative radiotherapy with the disease recurrence in the right paratracheal lymph node under the care of Dr. Shannon in March 2025. The patient is currently on observation. He had repeat CT scan of the chest, abdomen and pelvis performed recently.  I personally independently reviewed the scan and discussed the result with the patient and his wife and showed him the images.  His scan showed enlargement of the right paratracheal lymph node again concerning for disease progression with concern for bone invasion in the area and the patient is symptomatic and has right arm and shoulder with neuropathy. Assessment and Plan Assessment & Plan Recurrent stage IIIA non-small cell lung cancer, right paratracheal lymph node involvement Recurrent stage IIIA non-small cell lung cancer with right paratracheal lymph node involvement. Recent CT scan shows enlargement of the previously radiated right paratracheal lymph node, nearly doubling in size, indicating possible progression. The lymph node is close to the spine, potentially causing nerve compression symptoms. Previous treatment with neoadjuvant systemic chemoimmunotherapy and palliative radiotherapy. Current observation status. Discussed potential need for chemotherapy and immunotherapy if radiation is not feasible. Risks of excessive radiation include potential damage to the spine and other organs. Shared decision-making regarding treatment options, considering previous intolerance to immunotherapy and potential side effects. - Ordered PET scan to assess extent of disease and activity. - Ordered MRI of the brain to evaluate for potential metastasis. - Will consult with Dr. Shannon regarding potential for additional radiation therapy. - Will consider chemotherapy with carboplatin  and Alimta  if radiation is not feasible. - Will discuss potential reintroduction of immunotherapy with caution  due to previous intolerance.  Cancer-related neuropathic pain, right arm and shoulder Neuropathic pain in the right arm and shoulder, likely secondary to nerve compression from the enlarging lymph node. Symptoms include burning sensation, numbness, and severe pain, affecting sleep and daily activities. Gabapentin  has been prescribed and is being used. Additional pain management is required. - Prescribed Percocet for pain management as needed. - Continue gabapentin  for neuropathic pain. - Advised taking pain medication before sleep to improve rest. The patient was advised to call immediately if he has any other concerning symptoms in the interval.  The patient voices understanding of current disease status and treatment options and is in agreement with the current care plan.  All questions were answered. The patient knows to call the clinic with any problems, questions or concerns. We can certainly see the patient much sooner if necessary.  The total time spent in the appointment was 30 minutes including review of chart and various tests results, discussions about plan of care and coordination of care plan .   Disclaimer: This note was dictated with voice recognition software. Similar sounding words can inadvertently be transcribed and may not be corrected upon review.

## 2023-12-18 ENCOUNTER — Telehealth: Payer: Self-pay | Admitting: Internal Medicine

## 2023-12-18 NOTE — Telephone Encounter (Signed)
 Scheduled patient for a follow-up. Called and spoke with the patient to discuss the day and time, he is aware.

## 2023-12-19 ENCOUNTER — Ambulatory Visit
Admission: EM | Admit: 2023-12-19 | Discharge: 2023-12-19 | Disposition: A | Discharge status: Home / Self Care | Attending: Family Medicine | Admitting: Family Medicine

## 2023-12-19 DIAGNOSIS — J101 Influenza due to other identified influenza virus with other respiratory manifestations: Secondary | ICD-10-CM

## 2023-12-19 DIAGNOSIS — J069 Acute upper respiratory infection, unspecified: Secondary | ICD-10-CM

## 2023-12-19 LAB — POC COVID19/FLU A&B COMBO
Covid Antigen, POC: NEGATIVE
Influenza A Antigen, POC: POSITIVE — AB
Influenza B Antigen, POC: NEGATIVE

## 2023-12-19 LAB — POCT RAPID STREP A (OFFICE): Rapid Strep A Screen: NEGATIVE

## 2023-12-19 MED ORDER — AZELASTINE HCL 0.1 % NA SOLN: 1.0000 | Freq: Two times a day (BID) | NASAL | 0 refills | Status: AC

## 2023-12-19 MED ORDER — LIDOCAINE VISCOUS HCL 2 % MT SOLN: 10.0000 mL | OROMUCOSAL | 0 refills | Status: AC | PRN

## 2023-12-19 MED ORDER — OSELTAMIVIR PHOSPHATE 75 MG PO CAPS: 75.0000 mg | ORAL_CAPSULE | Freq: Two times a day (BID) | ORAL | 0 refills | Status: AC

## 2023-12-19 NOTE — ED Triage Notes (Signed)
 Pt reports he has a sore throat x 1 day

## 2023-12-19 NOTE — ED Provider Notes (Signed)
 [facility]    CSN: [id number] Arrival date & time: 12/19/23  9085      History   Chief Complaint No chief complaint on file.   HPI Patrick Harris is a 78 y.o. male.   Patient presenting today with 1 day history of sore throat, congestion, cough.  Denies fever, chills, chest pain, shortness of breath, abdominal pain, vomiting, diarrhea.  So far trying warm salt water gargles with minimal relief.  Multiple sick contacts recently at church.    Past Medical History:  Diagnosis Date   Arthritis    CAD (coronary artery disease)    CABG with LIMA to LAD and RIMA to RCA August 2000 - Dr. Kerrin   Cancer [facility])    basal cell   CHF (congestive heart failure) (HCC)    COPD (chronic obstructive pulmonary disease) (HCC)    Elevated glycated hemoglobin    Essential hypertension    GERD (gastroesophageal reflux disease)    Heart murmur    History of radiation therapy    Right lung(chest) 03/19/22-05/06/22- Dr. Lynwood Nasuti   History of radiation therapy    Right Lung- 02/25/23-03/17/23- Dr. Lynwood Nasuti   Hyperlipidemia    Hypopituitarism    Lung cancer (HCC)    Osteopenia    Dr Loetta   Pancreatitis    Peyronie disease    Shingles    Testosterone  deficiency    Dr Loetta    Patient Active Problem List   Diagnosis Date Noted   COVID-19 08/26/2023   Peripheral neuropathy due to chemotherapy 09/02/2022   Cough 07/05/2022   Port-A-Cath in place 05/28/2022   Nocturia 03/27/2022   Globus sensation 01/10/2022   Non-small cell cancer of right lung (HCC) 08/23/2021   Pulmonary nodules 06/19/2021   PAD (peripheral artery disease) 04/20/2021   Nausea 04/20/2021   Encounter for antineoplastic chemotherapy 03/30/2021   Aortic atherosclerosis 08/25/2019   Inguinal hernia 08/25/2019   Chronic periumbilical pain 08/25/2019   Claudication 08/24/2018   Neck pain 03/02/2018   Neuralgia 03/02/2018   Prediabetes 08/22/2017   Umbilical hernia 08/22/2017   Varicose  veins of both lower extremities 05/22/2016   Vitamin D  deficiency 08/21/2015   Hypertension 01/24/2015   Tingling in extremities 01/24/2015   Family history of prostate cancer 08/19/2014   Osteopenia 08/07/2009   Hyperlipidemia 08/12/2007   PITUITARY INSUFFICIENCY 08/05/2007   Testosterone  deficiency 08/05/2007   Coronary atherosclerosis 08/05/2007   GERD 08/05/2007   BPH without obstruction/lower urinary tract symptoms 08/05/2007    Past Surgical History:  Procedure Laterality Date   Bone spur  2006   5th toe bilaterally   BRONCHIAL BIOPSY  07/02/2021   Procedure: BRONCHIAL BIOPSIES;  Surgeon: Shelah Lamar RAMAN, MD;  Location: [facility] ENDOSCOPY;  Service: Pulmonary;;   BRONCHIAL BRUSHINGS  07/02/2021   Procedure: BRONCHIAL BRUSHINGS;  Surgeon: Shelah Lamar RAMAN, MD;  Location: [facility] ENDOSCOPY;  Service: Pulmonary;;   BRONCHIAL NEEDLE ASPIRATION BIOPSY  07/02/2021   Procedure: BRONCHIAL NEEDLE ASPIRATION BIOPSIES;  Surgeon: Shelah Lamar RAMAN, MD;  Location: [facility] ENDOSCOPY;  Service: Pulmonary;;   COLONOSCOPY  2007   negative; Dr Jakie   CORONARY ARTERY BYPASS GRAFT  2001   2 vessels   FIDUCIAL MARKER PLACEMENT  07/02/2021   Procedure: FIDUCIAL MARKER PLACEMENT;  Surgeon: Shelah Lamar RAMAN, MD;  Location: [facility] ENDOSCOPY;  Service: Pulmonary;;   fracture heel  1967   Bil/ due to fall off ladder   Fractured calcaneus  1969   bilaterally w/ ankle fusion  HERNIA REPAIR  1984   right inguinal hernia   KNEE SURGERY Left    LOBECTOMY Right 11/13/2021   MENISECTOMY  2007   R medial, Dr. Barbarann   PORTA CATH INSERTION     SHOULDER SURGERY  1994   left   UPPER GASTROINTESTINAL ENDOSCOPY  2007   GERD, Dr Jakie   VASECTOMY     VIDEO BRONCHOSCOPY WITH RADIAL ENDOBRONCHIAL ULTRASOUND  07/02/2021   Procedure: VIDEO BRONCHOSCOPY WITH RADIAL ENDOBRONCHIAL ULTRASOUND;  Surgeon: Shelah Lamar RAMAN, MD;  Location: [facility] ENDOSCOPY;  Service: Pulmonary;;       Home Medications    Prior to Admission  medications  Medication Sig Start Date End Date Taking? Authorizing Provider  azelastine  (ASTELIN ) 0.1 % nasal spray Place 1 spray into both nostrils 2 (two) times daily. Use in each nostril as directed 12/19/23  Yes Stuart Vernell Norris, PA-C  lidocaine  (XYLOCAINE ) 2 % solution Use as directed 10 mLs in the mouth or throat every 3 (three) hours as needed. 12/19/23  Yes Stuart Vernell Norris, PA-C  oseltamivir  (TAMIFLU ) 75 MG capsule Take 1 capsule (75 mg total) by mouth every 12 (twelve) hours. 12/19/23  Yes Stuart Vernell Norris, PA-C  amitriptyline  (ELAVIL ) 10 MG tablet TAKE 1 TABLET BY MOUTH EVERYDAY AT BEDTIME 08/14/23   McGreal, Inocente HERO, MD  amoxicillin -clavulanate (AUGMENTIN ) 875-125 MG tablet Take 1 tablet by mouth 2 (two) times daily. Patient not taking: Reported on 12/16/2023 10/16/23   Parrett, Madelin RAMAN, NP  aspirin  EC 81 MG tablet Take 1 tablet (81 mg total) by mouth at bedtime. Okay to restart this medication on 07/03/2021 07/02/21   Byrum, Robert S, MD  cabergoline (DOSTINEX) 0.5 MG tablet Take 0.25 mg by mouth 2 (two) times a week. 05/15/23   [provider]  Cholecalciferol (VITAMIN D3) 2000 UNITS TABS Take 2,000 Units by mouth in the morning and at bedtime.    [provider]  Coenzyme Q10 (COQ-10) 100 MG CAPS Take 100 mg by mouth in the morning.    [provider]  finasteride (PROSCAR) 5 MG tablet Take 5 mg by mouth daily. 08/20/22   [provider]  gabapentin  (NEURONTIN ) 100 MG capsule Take 1 capsule (100 mg total) by mouth 3 (three) times daily for 2 days, THEN 1 capsule (100 mg total) 2 (two) times daily for 2 days, THEN 1 capsule (100 mg total) daily at 6 (six) AM for 2 days. Patient not taking: No sig reported 12/09/23 12/15/23  Rogelia Jerilynn RAMAN, MD  gabapentin  (NEURONTIN ) 300 MG capsule Take 1 capsule (300 mg total) by mouth 3 (three) times daily. 12/12/23   Geofm Glade PARAS, MD  HYDROcodone  bit-homatropine (HYCODAN) 5-1.5 MG/5ML syrup Take 5 mLs  by mouth every 8 (eight) hours as needed for cough. Patient not taking: Reported on 12/16/2023 10/02/23   Geofm Glade PARAS, MD  irbesartan  (AVAPRO ) 75 MG tablet Take 1 tablet (75 mg total) by mouth daily. 09/09/23   Geofm Glade PARAS, MD  nitroGLYCERIN  (NITROSTAT ) 0.4 MG SL tablet Place 1 tablet (0.4 mg total) under the tongue every 5 (five) minutes x 3 doses as needed for chest pain (if no relief after 2nd dose, proceed to ED or call 911). 08/05/23   Debera Jayson MATSU, MD  Omega-3 Fatty Acids (OMEGA-3 FISH OIL PO) Take 1,000 mg by mouth in the morning.    [provider]  omeprazole  (PRILOSEC ) 40 MG capsule Take 1 capsule (40 mg total) by mouth daily. 07/22/23   McGreal,  Inocente HERO, MD  oxyCODONE -acetaminophen  (PERCOCET) 10-325 MG tablet Take 1 tablet by mouth every 8 (eight) hours as needed for pain. 12/17/23   Sherrod Sherrod, MD  polyethylene glycol (MIRALAX / GLYCOLAX) 17 g packet Take 17 g by mouth as needed. 11/28/21   [provider]  predniSONE  (DELTASONE ) 10 MG tablet Take 2 tablets (20 mg total) by mouth daily. Patient not taking: Reported on 12/16/2023 12/09/23   Rogelia Jerilynn RAMAN, MD  rosuvastatin  (CRESTOR ) 20 MG tablet Take 1 tablet (20 mg total) by mouth daily. 09/09/23 12/16/23  Geofm Glade PARAS, MD  sucralfate  (CARAFATE ) 1 g tablet Take 1 tablet (1 g total) by mouth 4 (four) times daily -  with meals and at bedtime. Crush and dissolve in 10 mL warm water prior to swallowing, take 20 min before meals Patient not taking: Reported on 12/16/2023 10/02/23   Geofm Glade PARAS, MD  tamsulosin  (FLOMAX ) 0.4 MG CAPS capsule Take 0.4 mg by mouth in the morning.    [provider]  Testosterone  20.25 MG/ACT (1.62%) GEL Apply 2 Pump topically in the morning. 07/27/19   [provider]  Tiotropium Bromide-Olodaterol (STIOLTO RESPIMAT ) 2.5-2.5 MCG/ACT AERS Inhale 2 puffs into the lungs daily. Patient not taking: Reported on 12/16/2023 10/16/23   Parrett, Madelin RAMAN, NP  Tiotropium  Bromide-Olodaterol (STIOLTO RESPIMAT ) 2.5-2.5 MCG/ACT AERS Inhale 2 puffs into the lungs daily. Patient not taking: Reported on 12/16/2023 10/16/23   Parrett, Madelin RAMAN, NP  vitamin C (ASCORBIC ACID) 500 MG tablet Take 500 mg by mouth in the morning.    [provider]  Vitamin E (VITAMIN E/D-ALPHA NATURAL) 268 MG (400 UNIT) CAPS Take 1 tablet by mouth daily.    [provider]    Family History Family History  Problem Relation Age of Onset   Diabetes Mother    Stroke Mother 26   Hypertension Mother    Heart attack Father 40   Leukemia Sister    Parkinson's disease Sister    Stroke Sister    Stroke Sister    Stroke Sister    Atrial fibrillation Sister    Prostate cancer Brother    Aortic aneurysm Brother        abdominal   Heart disease Brother    Atrial fibrillation Brother    Cancer Maternal Uncle         X3:prostate , renal, bone    Colon cancer Paternal Uncle    Breast cancer Daughter    Esophageal cancer Neg Hx    Rectal cancer Neg Hx    Stomach cancer Neg Hx     Social History Social History[1]   Allergies   Latex, Bromocriptine, Zestril  [lisinopril ], Diovan  [valsartan ], Hydrochlorothiazide , Norvasc  [amlodipine ], and Zocor [simvastatin]   Review of Systems Review of Systems PER HPI  Physical Exam Triage Vital Signs ED Triage Vitals  Encounter Vitals Group     BP 12/19/23 0938 113/73     Girls Systolic BP Percentile --      Girls Diastolic BP Percentile --      Boys Systolic BP Percentile --      Boys Diastolic BP Percentile --      Pulse Rate 12/19/23 0938 95     Resp 12/19/23 0938 (!) 26     Temp 12/19/23 0938 99.2 F (37.3 C)     Temp Source 12/19/23 0938 Oral     SpO2 12/19/23 0938 92 %     Weight --      Height --  Head Circumference --      Peak Flow --      Pain Score 12/19/23 0940 9     Pain Loc --      Pain Education --      Exclude from Growth Chart --    No data found.  Updated Vital Signs BP 113/73 (BP  Location: Right Arm)   Pulse 95   Temp 99.2 F (37.3 C) (Oral)   Resp (!) 26   SpO2 92%   Visual Acuity Right Eye Distance:   Left Eye Distance:   Bilateral Distance:    Right Eye Near:   Left Eye Near:    Bilateral Near:     Physical Exam Vitals and nursing note reviewed.  Constitutional:      Appearance: He is well-developed.  HENT:     Head: Atraumatic.     Right Ear: External ear normal.     Left Ear: External ear normal.     Nose: Rhinorrhea present.     Mouth/Throat:     Pharynx: Posterior oropharyngeal erythema present. No oropharyngeal exudate.  Eyes:     Conjunctiva/sclera: Conjunctivae normal.     Pupils: Pupils are equal, round, and reactive to light.  Cardiovascular:     Rate and Rhythm: Normal rate and regular rhythm.  Pulmonary:     Effort: Pulmonary effort is normal. No respiratory distress.     Breath sounds: No wheezing or rales.  Musculoskeletal:        General: Normal range of motion.     Cervical back: Normal range of motion and neck supple.  Lymphadenopathy:     Cervical: No cervical adenopathy.  Skin:    General: Skin is warm and dry.  Neurological:     Mental Status: He is alert and oriented to person, place, and time.  Psychiatric:        Behavior: Behavior normal.      UC Treatments / Results  Labs (all labs ordered are listed, but only abnormal results are displayed) Labs Reviewed  POC COVID19/FLU A&B COMBO - Abnormal; Notable for the following components:      Result Value   Influenza A Antigen, POC Positive (*)    All other components within normal limits  POCT RAPID STREP A (OFFICE) - Normal    EKG   Radiology No results found.  Procedures Procedures (including critical care time)  Medications Ordered in UC Medications - No data to display  Initial Impression / Assessment and Plan / UC Course  I have reviewed the triage vital signs and the nursing notes.  Pertinent labs & imaging results that were available  during my care of the patient were reviewed by me and considered in my medical decision making (see chart for details).     Rapid flu and COVID positive for influenza A.  Vitals and exam reassuring today, will treat with Tamiflu , Astelin , viscous lidocaine , supportive over-the-counter medications and home care.  Return for worsening or unresolving symptoms.  Final Clinical Impressions(s) / UC Diagnoses   Final diagnoses:  Viral URI with cough  Influenza A     Discharge Instructions      In addition to the prescribed medications, you may take Coricidin HBP, plain Mucinex, use saline sinus rinses, Tylenol  as needed.    ED Prescriptions     Medication Sig Dispense Auth. Provider   oseltamivir  (TAMIFLU ) 75 MG capsule Take 1 capsule (75 mg total) by mouth every 12 (twelve) hours. 10 capsule Stuart Millman  Elizabeth, PA-C   azelastine  (ASTELIN ) 0.1 % nasal spray Place 1 spray into both nostrils 2 (two) times daily. Use in each nostril as directed 30 mL Stuart Vernell Norris, PA-C   lidocaine  (XYLOCAINE ) 2 % solution Use as directed 10 mLs in the mouth or throat every 3 (three) hours as needed. 100 mL Stuart Vernell Norris, [place]      PDMP not reviewed this encounter.     [1]  Social History Tobacco Use   Smoking status: Former    Current packs/day: 0.00    Average packs/day: 0.3 packs/day    Types: Cigarettes    Quit date: 05/2021    Years since quitting: 2.6    Passive exposure: Past   Smokeless tobacco: Never   Tobacco comments:    Smoked about 40 years, 1ppd  Vaping Use   Vaping status: Never Used  Substance Use Topics   Alcohol use: No   Drug use: No     Stuart Vernell Norris, PA-C 12/20/23 0901

## 2023-12-19 NOTE — Discharge Instructions (Addendum)
 In addition to the prescribed medications, you may take Coricidin HBP, plain Mucinex, use saline sinus rinses, Tylenol  as needed.

## 2023-12-22 ENCOUNTER — Ambulatory Visit (INDEPENDENT_AMBULATORY_CARE_PROVIDER_SITE_OTHER)

## 2023-12-22 ENCOUNTER — Ambulatory Visit
Admission: EM | Admit: 2023-12-22 | Discharge: 2023-12-22 | Disposition: A | Discharge status: Home / Self Care | Source: Home / Self Care

## 2023-12-22 DIAGNOSIS — R300 Dysuria: Secondary | ICD-10-CM | POA: Diagnosis present

## 2023-12-22 DIAGNOSIS — J441 Chronic obstructive pulmonary disease with (acute) exacerbation: Secondary | ICD-10-CM | POA: Diagnosis present

## 2023-12-22 DIAGNOSIS — J101 Influenza due to other identified influenza virus with other respiratory manifestations: Secondary | ICD-10-CM | POA: Diagnosis not present

## 2023-12-22 LAB — POCT URINE DIPSTICK
Bilirubin, UA: NEGATIVE
Glucose, UA: NEGATIVE mg/dL
Ketones, POC UA: NEGATIVE mg/dL
Leukocytes, UA: NEGATIVE
Nitrite, UA: NEGATIVE
POC PROTEIN,UA: NEGATIVE
Spec Grav, UA: 1.01 (ref 1.010–1.025)
Urobilinogen, UA: 0.2 U/dL
pH, UA: 7 (ref 5.0–8.0)

## 2023-12-22 MED ORDER — PREDNISONE 20 MG PO TABS: 40.0000 mg | ORAL_TABLET | Freq: Every day | ORAL | 0 refills | Status: AC

## 2023-12-22 MED ORDER — STIOLTO RESPIMAT 2.5-2.5 MCG/ACT IN AERS: 2.0000 | INHALATION_SPRAY | Freq: Every day | RESPIRATORY_TRACT | 5 refills | Status: AC

## 2023-12-22 MED ORDER — ALBUTEROL SULFATE HFA 108 (90 BASE) MCG/ACT IN AERS: 1.0000 | INHALATION_SPRAY | Freq: Four times a day (QID) | RESPIRATORY_TRACT | 0 refills | Status: AC | PRN

## 2023-12-22 MED ORDER — CEFUROXIME AXETIL 500 MG PO TABS: 500.0000 mg | ORAL_TABLET | Freq: Two times a day (BID) | ORAL | 0 refills | Status: AC

## 2023-12-22 MED ORDER — IPRATROPIUM-ALBUTEROL 0.5-2.5 (3) MG/3ML IN SOLN
3.0000 mL | Freq: Once | RESPIRATORY_TRACT | Status: AC
  Administered 2023-12-22: 11:00:00 3 mL via RESPIRATORY_TRACT

## 2023-12-22 MED ORDER — BENZONATATE 100 MG PO CAPS: 100.0000 mg | ORAL_CAPSULE | Freq: Three times a day (TID) | ORAL | 0 refills | Status: AC | PRN

## 2023-12-22 NOTE — ED Provider Notes (Signed)
 [facility]    CSN: [id number] Arrival date & time: 12/22/23  1016      History   Chief Complaint No chief complaint on file.   HPI Patrick Harris is a 78 y.o. male.   Patient presents today with 4-day history of cough and congestion.  Reports he was seen on 12/19/2023 and was diagnosed with influenza, has been taking Tamiflu  as prescribed.  Reports over the past couple of days, breathing has seemed to worsen and he hears himself wheezing.  He endorses chest congestion, but denies chest pain or tightness.  He does endorse shortness of breath worse than normal.  Reports history of smoking 20 years ago but quit.  Reports he has seen pulmonology and has been prescribed a daily inhaler in the past, however in the inhaler ran out he stopped taking it.  He also endorses nasal congestion.  No recent fever, body aches or chills.  No abdominal pain, nausea/vomiting, or diarrhea.  Reports he is trying to stay hydrated.  Patient is also concerned about possible urinary tract infection.  Reports 2-week history of dysuria and nocturia with urinary hesitancy.  No penile discharge, scrotal pain, or groin swelling.  No concern for STI today.  Patient reports history of lung cancer, is following closely with oncology and has a couple of scans scheduled for enlarging tumor in his upper chest pushing of his trachea.    Past Medical History:  Diagnosis Date   Arthritis    CAD (coronary artery disease)    CABG with LIMA to LAD and RIMA to RCA August 2000 - Dr. Kerrin   Cancer [facility])    basal cell   CHF (congestive heart failure) (HCC)    COPD (chronic obstructive pulmonary disease) (HCC)    Elevated glycated hemoglobin    Essential hypertension    GERD (gastroesophageal reflux disease)    Heart murmur    History of radiation therapy    Right lung(chest) 03/19/22-05/06/22- Dr. Lynwood Nasuti   History of radiation therapy    Right Lung- 02/25/23-03/17/23- Dr. Lynwood Nasuti    Hyperlipidemia    Hypopituitarism    Lung cancer (HCC)    Osteopenia    Dr Loetta   Pancreatitis    Peyronie disease    Shingles    Testosterone  deficiency    Dr Loetta    Patient Active Problem List   Diagnosis Date Noted   COVID-19 08/26/2023   Peripheral neuropathy due to chemotherapy 09/02/2022   Cough 07/05/2022   Port-A-Cath in place 05/28/2022   Nocturia 03/27/2022   Globus sensation 01/10/2022   Non-small cell cancer of right lung (HCC) 08/23/2021   Pulmonary nodules 06/19/2021   PAD (peripheral artery disease) 04/20/2021   Nausea 04/20/2021   Encounter for antineoplastic chemotherapy 03/30/2021   Aortic atherosclerosis 08/25/2019   Inguinal hernia 08/25/2019   Chronic periumbilical pain 08/25/2019   Claudication 08/24/2018   Neck pain 03/02/2018   Neuralgia 03/02/2018   Prediabetes 08/22/2017   Umbilical hernia 08/22/2017   Varicose veins of both lower extremities 05/22/2016   Vitamin D  deficiency 08/21/2015   Hypertension 01/24/2015   Tingling in extremities 01/24/2015   Family history of prostate cancer 08/19/2014   Osteopenia 08/07/2009   Hyperlipidemia 08/12/2007   PITUITARY INSUFFICIENCY 08/05/2007   Testosterone  deficiency 08/05/2007   Coronary atherosclerosis 08/05/2007   GERD 08/05/2007   BPH without obstruction/lower urinary tract symptoms 08/05/2007    Past Surgical History:  Procedure Laterality Date   Bone spur  2006  5th toe bilaterally   BRONCHIAL BIOPSY  07/02/2021   Procedure: BRONCHIAL BIOPSIES;  Surgeon: Shelah Lamar RAMAN, MD;  Location: [facility] ENDOSCOPY;  Service: Pulmonary;;   BRONCHIAL BRUSHINGS  07/02/2021   Procedure: BRONCHIAL BRUSHINGS;  Surgeon: Shelah Lamar RAMAN, MD;  Location: [facility] ENDOSCOPY;  Service: Pulmonary;;   BRONCHIAL NEEDLE ASPIRATION BIOPSY  07/02/2021   Procedure: BRONCHIAL NEEDLE ASPIRATION BIOPSIES;  Surgeon: Shelah Lamar RAMAN, MD;  Location: [facility] ENDOSCOPY;  Service: Pulmonary;;   COLONOSCOPY  2007   negative; Dr Jakie    CORONARY ARTERY BYPASS GRAFT  2001   2 vessels   FIDUCIAL MARKER PLACEMENT  07/02/2021   Procedure: FIDUCIAL MARKER PLACEMENT;  Surgeon: Shelah Lamar RAMAN, MD;  Location: [facility] ENDOSCOPY;  Service: Pulmonary;;   fracture heel  1967   Bil/ due to fall off ladder   Fractured calcaneus  1969   bilaterally w/ ankle fusion   HERNIA REPAIR  1984   right inguinal hernia   KNEE SURGERY Left    LOBECTOMY Right 11/13/2021   MENISECTOMY  2007   R medial, Dr. Barbarann SKEETER CATH INSERTION     SHOULDER SURGERY  1994   left   UPPER GASTROINTESTINAL ENDOSCOPY  2007   GERD, Dr Jakie   VASECTOMY     VIDEO BRONCHOSCOPY WITH RADIAL ENDOBRONCHIAL ULTRASOUND  07/02/2021   Procedure: VIDEO BRONCHOSCOPY WITH RADIAL ENDOBRONCHIAL ULTRASOUND;  Surgeon: Shelah Lamar RAMAN, MD;  Location: [facility] ENDOSCOPY;  Service: Pulmonary;;       Home Medications    Prior to Admission medications  Medication Sig Start Date End Date Taking? Authorizing Provider  albuterol  (VENTOLIN  HFA) 108 (90 Base) MCG/ACT inhaler Inhale 1-2 puffs into the lungs every 6 (six) hours as needed for wheezing or shortness of breath. 12/22/23  Yes Chandra Harlene LABOR, NP  benzonatate  (TESSALON ) 100 MG capsule Take 1 capsule (100 mg total) by mouth 3 (three) times daily as needed for cough. Do not take with alcohol or while operating or driving heavy machinery 87/84/74  Yes Chandra Harlene LABOR, NP  cefUROXime  (CEFTIN ) 500 MG tablet Take 1 tablet (500 mg total) by mouth 2 (two) times daily with a meal. 12/22/23  Yes Chandra Harlene A, NP  predniSONE  (DELTASONE ) 20 MG tablet Take 2 tablets (40 mg total) by mouth daily with breakfast for 5 days. 12/22/23 12/27/23 Yes Chandra Harlene LABOR, NP  amitriptyline  (ELAVIL ) 10 MG tablet TAKE 1 TABLET BY MOUTH EVERYDAY AT BEDTIME 08/14/23   Suzann Inocente HERO, MD  aspirin  EC 81 MG tablet Take 1 tablet (81 mg total) by mouth at bedtime. Okay to restart this medication on 07/03/2021 07/02/21   Shelah Lamar RAMAN, MD   azelastine  (ASTELIN ) 0.1 % nasal spray Place 1 spray into both nostrils 2 (two) times daily. Use in each nostril as directed 12/19/23   Stuart Vernell Norris, PA-C  cabergoline (DOSTINEX) 0.5 MG tablet Take 0.25 mg by mouth 2 (two) times a week. 05/15/23   [provider]  Cholecalciferol (VITAMIN D3) 2000 UNITS TABS Take 2,000 Units by mouth in the morning and at bedtime.    [provider]  Coenzyme Q10 (COQ-10) 100 MG CAPS Take 100 mg by mouth in the morning.    [provider]  finasteride (PROSCAR) 5 MG tablet Take 5 mg by mouth daily. 08/20/22   [provider]  gabapentin  (NEURONTIN ) 100 MG capsule Take 1 capsule (100 mg total) by mouth 3 (three) times daily for 2 days, THEN 1 capsule (100 mg  total) 2 (two) times daily for 2 days, THEN 1 capsule (100 mg total) daily at 6 (six) AM for 2 days. Patient not taking: No sig reported 12/09/23 12/15/23  Rogelia Jerilynn RAMAN, MD  gabapentin  (NEURONTIN ) 300 MG capsule Take 1 capsule (300 mg total) by mouth 3 (three) times daily. 12/12/23   Geofm Glade PARAS, MD  HYDROcodone  bit-homatropine (HYCODAN) 5-1.5 MG/5ML syrup Take 5 mLs by mouth every 8 (eight) hours as needed for cough. Patient not taking: Reported on 12/16/2023 10/02/23   Geofm Glade PARAS, MD  irbesartan  (AVAPRO ) 75 MG tablet Take 1 tablet (75 mg total) by mouth daily. 09/09/23   Geofm Glade PARAS, MD  lidocaine  (XYLOCAINE ) 2 % solution Use as directed 10 mLs in the mouth or throat every 3 (three) hours as needed. 12/19/23   Stuart Vernell Norris, PA-C  nitroGLYCERIN  (NITROSTAT ) 0.4 MG SL tablet Place 1 tablet (0.4 mg total) under the tongue every 5 (five) minutes x 3 doses as needed for chest pain (if no relief after 2nd dose, proceed to ED or call 911). 08/05/23   Debera Jayson MATSU, MD  Omega-3 Fatty Acids (OMEGA-3 FISH OIL PO) Take 1,000 mg by mouth in the morning.    [provider]  omeprazole  (PRILOSEC ) 40 MG capsule Take 1 capsule (40 mg total) by mouth  daily. 07/22/23   Suzann Inocente HERO, MD  oseltamivir  (TAMIFLU ) 75 MG capsule Take 1 capsule (75 mg total) by mouth every 12 (twelve) hours. 12/19/23   Stuart Vernell Norris, PA-C  oxyCODONE -acetaminophen  (PERCOCET) 10-325 MG tablet Take 1 tablet by mouth every 8 (eight) hours as needed for pain. 12/17/23   Sherrod Sherrod, MD  polyethylene glycol (MIRALAX / GLYCOLAX) 17 g packet Take 17 g by mouth as needed. 11/28/21   [provider]  rosuvastatin  (CRESTOR ) 20 MG tablet Take 1 tablet (20 mg total) by mouth daily. 09/09/23 12/16/23  Geofm Glade PARAS, MD  sucralfate  (CARAFATE ) 1 g tablet Take 1 tablet (1 g total) by mouth 4 (four) times daily -  with meals and at bedtime. Crush and dissolve in 10 mL warm water prior to swallowing, take 20 min before meals Patient not taking: Reported on 12/16/2023 10/02/23   Geofm Glade PARAS, MD  tamsulosin  (FLOMAX ) 0.4 MG CAPS capsule Take 0.4 mg by mouth in the morning.    [provider]  Testosterone  20.25 MG/ACT (1.62%) GEL Apply 2 Pump topically in the morning. 07/27/19   [provider]  Tiotropium Bromide-Olodaterol (STIOLTO RESPIMAT ) 2.5-2.5 MCG/ACT AERS Inhale 2 puffs into the lungs daily. 12/22/23   Chandra Harlene LABOR, NP  vitamin C (ASCORBIC ACID) 500 MG tablet Take 500 mg by mouth in the morning.    [provider]  Vitamin E (VITAMIN E/D-ALPHA NATURAL) 268 MG (400 UNIT) CAPS Take 1 tablet by mouth daily.    [provider]    Family History Family History  Problem Relation Age of Onset   Diabetes Mother    Stroke Mother 3   Hypertension Mother    Heart attack Father 35   Leukemia Sister    Parkinson's disease Sister    Stroke Sister    Stroke Sister    Stroke Sister    Atrial fibrillation Sister    Prostate cancer Brother    Aortic aneurysm Brother        abdominal   Heart disease Brother    Atrial fibrillation Brother    Cancer Maternal Uncle  X3:prostate , renal, bone    Colon cancer  Paternal Uncle    Breast cancer Daughter    Esophageal cancer Neg Hx    Rectal cancer Neg Hx    Stomach cancer Neg Hx     Social History Social History[1]   Allergies   Latex, Bromocriptine, Zestril  [lisinopril ], Diovan  [valsartan ], Hydrochlorothiazide , Norvasc  [amlodipine ], and Zocor [simvastatin]   Review of Systems Review of Systems Per HPI  Physical Exam Triage Vital Signs ED Triage Vitals  Encounter Vitals Group     BP 12/22/23 1046 (!) 149/81     Girls Systolic BP Percentile --      Girls Diastolic BP Percentile --      Boys Systolic BP Percentile --      Boys Diastolic BP Percentile --      Pulse Rate 12/22/23 1046 89     Resp 12/22/23 1046 (!) 22     Temp 12/22/23 1046 98 F (36.7 C)     Temp Source 12/22/23 1046 Oral     SpO2 12/22/23 1046 93 %     Weight --      Height --      Head Circumference --      Peak Flow --      Pain Score 12/22/23 1051 0     Pain Loc --      Pain Education --      Exclude from Growth Chart --    No data found.  Updated Vital Signs BP (!) 149/81 (BP Location: Right Arm)   Pulse 89   Temp 98 F (36.7 C) (Oral)   Resp (!) 22   SpO2 93%   Visual Acuity Right Eye Distance:   Left Eye Distance:   Bilateral Distance:    Right Eye Near:   Left Eye Near:    Bilateral Near:     Physical Exam Vitals and nursing note reviewed.  Constitutional:      General: He is not in acute distress.    Appearance: Normal appearance. He is not ill-appearing or toxic-appearing.  HENT:     Head: Normocephalic and atraumatic.     Right Ear: Tympanic membrane, ear canal and external ear normal.     Left Ear: Tympanic membrane, ear canal and external ear normal.     Nose: No congestion or rhinorrhea.     Mouth/Throat:     Mouth: Mucous membranes are moist.     Pharynx: Oropharynx is clear. No oropharyngeal exudate or posterior oropharyngeal erythema.  Eyes:     General: No scleral icterus.    Extraocular Movements: Extraocular  movements intact.  Cardiovascular:     Rate and Rhythm: Normal rate and regular rhythm.  Pulmonary:     Effort: Pulmonary effort is normal. No respiratory distress.     Breath sounds: Decreased air movement present. Wheezing present. No rhonchi or rales.  Musculoskeletal:     Cervical back: Normal range of motion and neck supple.  Lymphadenopathy:     Cervical: No cervical adenopathy.  Skin:    General: Skin is warm and dry.     Coloration: Skin is not jaundiced or pale.     Findings: No erythema or rash.  Neurological:     Mental Status: He is alert and oriented to person, place, and time.  Psychiatric:        Behavior: Behavior is cooperative.      UC Treatments / Results  Labs (all labs ordered are listed, but only abnormal results  are displayed) Labs Reviewed  POCT URINE DIPSTICK - Abnormal; Notable for the following components:      Result Value   Blood, UA trace-lysed (*)    All other components within normal limits  URINE CULTURE    EKG   Radiology DG Chest 2 View Result Date: 12/22/2023 CLINICAL DATA:  Cough and wheezing. EXAM: DG CHEST 2V COMPARISON:  12/09/2023 and CT chest 12/10/2023. FINDINGS: Right IJ Port-A-Cath tip is in the SVC. Rightward shift of the mediastinum secondary to right upper lobectomy and post treatment scarring. Overall volume loss in the right hemithorax with elevation of the right hemidiaphragm. Left lung is clear. Left apical pleural thickening. No pleural fluid. IMPRESSION: Right upper lobectomy.  No acute findings. Electronically Signed   By: Newell Eke M.D.   On: 12/22/2023 11:41    Procedures Procedures (including critical care time)  Medications Ordered in UC Medications  ipratropium-albuterol  (DUONEB) 0.5-2.5 (3) MG/3ML nebulizer solution 3 mL (3 mLs Nebulization Given 12/22/23 1120)    Initial Impression / Assessment and Plan / UC Course  I have reviewed the triage vital signs and the nursing notes.  Pertinent labs &  imaging results that were available during my care of the patient were reviewed by me and considered in my medical decision making (see chart for details).   Patient is a very pleasant, well-appearing 78 year old Caucasian male presenting today for cough and congestion.  Overall, vital signs are stable.  Examination is overall reassuring, however given wheezing, suspect COPD exacerbation.  DuoNeb was given with improvement in air movement bilaterally and patient reported subjective improvement.  Chest x-ray is negative for acute cardiopulmonary process.  Resume Stiolto.  Start rescue inhaler as needed, Ceftin  which will cover COPD exacerbation and for acute lower UTI.  Also start prednisone  40 mg daily for 5 days to treat for lung inflammation, cough suppressant medication.    Urinalysis shows trace lysed blood today; urine culture is pending given dysuria and male patient.  Patient is starting Ceftin  today.  ER and return precautions discussed.  Follow-up with pulmonology, oncology as planned.  The patient was given the opportunity to ask questions.  All questions answered to their satisfaction.  The patient is in agreement to this plan.   Final Clinical Impressions(s) / UC Diagnoses   Final diagnoses:  Influenza A with respiratory manifestations  Dysuria  COPD exacerbation (HCC)     Discharge Instructions      You are having an exacerbation of COPD due to influenza.  Start using the albuterol  inhaler every 4-6 hours scheduled for the next 2 days, then use as needed.  Resume using your Stiolto for COPD.  Start taking the oral prednisone  to help with lung inflammation.  You can also take the cough suppressant medication as needed.  Also recommend Mucinex as needed for congestion.  In addition, recommend taking Ceftin  to treat for atypical infection.  This will also treat for a urinary infection, urine culture is pending.  Symptoms should improve over the next few days.  If symptoms worsen  despite treatment, please return for reevaluation or follow-up in the ER.     ED Prescriptions     Medication Sig Dispense Auth. Provider   Tiotropium Bromide-Olodaterol (STIOLTO RESPIMAT ) 2.5-2.5 MCG/ACT AERS Inhale 2 puffs into the lungs daily. 4 g Chandra Raisin A, NP   predniSONE  (DELTASONE ) 20 MG tablet Take 2 tablets (40 mg total) by mouth daily with breakfast for 5 days. 10 tablet Chandra Raisin LABOR, NP  cefUROXime  (CEFTIN ) 500 MG tablet Take 1 tablet (500 mg total) by mouth 2 (two) times daily with a meal. 14 tablet Chandra Raisin A, NP   benzonatate  (TESSALON ) 100 MG capsule Take 1 capsule (100 mg total) by mouth 3 (three) times daily as needed for cough. Do not take with alcohol or while operating or driving heavy machinery 21 capsule Chandra Raisin A, NP   albuterol  (VENTOLIN  HFA) 108 (90 Base) MCG/ACT inhaler Inhale 1-2 puffs into the lungs every 6 (six) hours as needed for wheezing or shortness of breath. 6.7 g Chandra Raisin LABOR, NP      PDMP not reviewed this encounter.    [1]  Social History Tobacco Use   Smoking status: Former    Current packs/day: 0.00    Average packs/day: 0.3 packs/day    Types: Cigarettes    Quit date: 05/2021    Years since quitting: 2.6    Passive exposure: Past   Smokeless tobacco: Never   Tobacco comments:    Smoked about 40 years, 1ppd  Vaping Use   Vaping status: Never Used  Substance Use Topics   Alcohol use: No   Drug use: No     Chandra Raisin LABOR, NP 12/22/23 1504

## 2023-12-22 NOTE — Discharge Instructions (Addendum)
 You are having an exacerbation of COPD due to influenza.  Start using the albuterol  inhaler every 4-6 hours scheduled for the next 2 days, then use as needed.  Resume using your Stiolto for COPD.  Start taking the oral prednisone  to help with lung inflammation.  You can also take the cough suppressant medication as needed.  Also recommend Mucinex as needed for congestion.  In addition, recommend taking Ceftin  to treat for atypical infection.  This will also treat for a urinary infection, urine culture is pending.  Symptoms should improve over the next few days.  If symptoms worsen despite treatment, please return for reevaluation or follow-up in the ER.

## 2023-12-22 NOTE — ED Triage Notes (Signed)
 Pt reports being green color mucus and phlegm, was diagnosed with the flu on 12/19/2023. States he has had this issue several times and has seen his primary, a lung specialist and an ENT. Feels like he is actively wheezing can feel it in his chest.

## 2023-12-23 ENCOUNTER — Ambulatory Visit (HOSPITAL_COMMUNITY): Payer: Self-pay

## 2023-12-23 ENCOUNTER — Encounter: Payer: Self-pay | Admitting: Gastroenterology

## 2023-12-23 ENCOUNTER — Ambulatory Visit: Admitting: Gastroenterology

## 2023-12-23 VITALS — BP 140/80 | HR 82 | Ht 71.5 in | Wt 199.0 lb

## 2023-12-23 DIAGNOSIS — K219 Gastro-esophageal reflux disease without esophagitis: Secondary | ICD-10-CM

## 2023-12-23 DIAGNOSIS — R1084 Generalized abdominal pain: Secondary | ICD-10-CM

## 2023-12-23 DIAGNOSIS — K5900 Constipation, unspecified: Secondary | ICD-10-CM

## 2023-12-23 LAB — URINE CULTURE: Culture: NO GROWTH

## 2023-12-23 NOTE — Progress Notes (Signed)
 Chief Complaint:follow-up  Primary GI Doctor:Dr. Suzann  Patient Profile: Patrick Harris is a 78 y.o. male with a past medical history noteworthy for RUL adenocarcinoma and squamous cell carcinoma - post chemotherapy, radiation, immunotherapy, surgery, atherosclerosis, peripheral neuropathy, pituitary insufficiency who returns to the [facility] for follow-up of the problem(s) noted below.   Problem List: Dysphagia, odynophagia, GERD, history esophageal ulcer d/t radiation, 2024 - R/O stricture, persistent esoph ulcer, esophagitis, candida esophagitis Personal history of adenomatous colon polyps History of periumbilical abdominal pain - suspected Imfinzi  side effect History of erosive gastritis, 06/2022 -resolved on EGD 12/2022 Constipation   Interval History Patient was last seen in the GI office on 04/23/2023 by Dr. Suzann.  Current GI Meds  Amitriptyline  10 mg p.o. nightly Omeprazole  40 mg orally daily Sucralfate    Interval History   Dysphagia/odynophagia Fall 2024 Mr. Gadson  was endorsing ongoing dysphagia and odynophagia which Worth Kober have been a sequelae of his radiation therapy for lung cancer EGD 12/2022 - 2 esophageal stenoses at 26 cm and GE junction status post savory dilation to 17 mm, small HH States that his swallowing is improved at this juncture He is taking omeprazole  40 mg daily Also using an OTC agent called Throat Calm that contains Echinacea and has been beneficial   Abdominal pain Mid/epigastric abdominal pain that began in [place] 2024 which he associated with Imfinzi  EGD and CT scan 12/2022 were unrevealing Did not have any improvement with omeprazole , Carafate  or FDgard Started on amitriptyline  10 mg p.o. nightly in February 2025 Notes substantial improvement in his symptoms and he is pleased with his progress. Recently had pharmacy change manufacturer of medication and his symptoms had returned. After some investigating he realized  it and had it changed at pharmacy about a month ago. Denies side effects of amitriptyline -no excess sedation, lightheadedness or fatigue   Constipation Previously endorsed mild constipation. Reports currently not an issue.  Has been using Metamucil and MiraLAX resulting in more regular bowel movements Recently prescribed oxycodone  for cancer- related neuropathic pain, reports this has not affected his bowels.    History of adenomatous colon polyps Colonoscopy 12/2022 - 3 sessile polyps in TC, left colon diverticula, EH, IH Pathology letter recommended surveillance colonoscopy in 3 years -he would be 78 years of age at that time Discussed with Dr. Suzann that depending upon health issues could consider ceasing further surveillance colonoscopies  Recently diagnosed with Flu A  Prescribed Tamiflu    Last colonoscopy: 12/2022 -3 sessile polyps in TC, left colon diverticula, EH, IH Last endoscopy: 12/2022 -2 esophageal stenoses at 26 cm and GE junction status post savory dilation to 17 mm, small HH   Last Abd CT/CTE/MRE: CTAP 12/2022 -no intra-abdominal abnormalities  Wt Readings from Last 3 Encounters:  12/23/23 199 lb (90.3 kg)  12/17/23 195 lb 14.4 oz (88.9 kg)  12/16/23 198 lb (89.8 kg)      Past Medical History:  Diagnosis Date   Arthritis    CAD (coronary artery disease)    CABG with LIMA to LAD and RIMA to RCA August 2000 - Dr. Kerrin   Cancer [facility])    basal cell   CHF (congestive heart failure) (HCC)    COPD (chronic obstructive pulmonary disease) (HCC)    Elevated glycated hemoglobin    Essential hypertension    GERD (gastroesophageal reflux disease)    Heart murmur    History of radiation therapy    Right lung(chest) 03/19/22-05/06/22- Dr. Lynwood Nasuti   History of radiation  therapy    Right Lung- 02/25/23-03/17/23- Dr. Lynwood Nasuti   Hyperlipidemia    Hypopituitarism    Lung cancer [facility])    Osteopenia    Dr Loetta   Pancreatitis    Peyronie disease     Shingles    Testosterone  deficiency    Dr Loetta    Past Surgical History:  Procedure Laterality Date   Bone spur  2006   5th toe bilaterally   BRONCHIAL BIOPSY  07/02/2021   Procedure: BRONCHIAL BIOPSIES;  Surgeon: Shelah Lamar RAMAN, MD;  Location: [facility] ENDOSCOPY;  Service: Pulmonary;;   BRONCHIAL BRUSHINGS  07/02/2021   Procedure: BRONCHIAL BRUSHINGS;  Surgeon: Shelah Lamar RAMAN, MD;  Location: [facility] ENDOSCOPY;  Service: Pulmonary;;   BRONCHIAL NEEDLE ASPIRATION BIOPSY  07/02/2021   Procedure: BRONCHIAL NEEDLE ASPIRATION BIOPSIES;  Surgeon: Shelah Lamar RAMAN, MD;  Location: [facility] ENDOSCOPY;  Service: Pulmonary;;   COLONOSCOPY  2007   negative; Dr Jakie   CORONARY ARTERY BYPASS GRAFT  2001   2 vessels   FIDUCIAL MARKER PLACEMENT  07/02/2021   Procedure: FIDUCIAL MARKER PLACEMENT;  Surgeon: Shelah Lamar RAMAN, MD;  Location: [facility] ENDOSCOPY;  Service: Pulmonary;;   fracture heel  1967   Bil/ due to fall off ladder   Fractured calcaneus  1969   bilaterally w/ ankle fusion   HERNIA REPAIR  1984   right inguinal hernia   KNEE SURGERY Left    LOBECTOMY Right 11/13/2021   MENISECTOMY  2007   R medial, Dr. Barbarann   PORTA CATH INSERTION     SHOULDER SURGERY  1994   left   UPPER GASTROINTESTINAL ENDOSCOPY  2007   GERD, Dr Jakie   VASECTOMY     VIDEO BRONCHOSCOPY WITH RADIAL ENDOBRONCHIAL ULTRASOUND  07/02/2021   Procedure: VIDEO BRONCHOSCOPY WITH RADIAL ENDOBRONCHIAL ULTRASOUND;  Surgeon: Shelah Lamar RAMAN, MD;  Location: [facility] ENDOSCOPY;  Service: Pulmonary;;    Current Outpatient Medications  Medication Sig Dispense Refill   albuterol  (VENTOLIN  HFA) 108 (90 Base) MCG/ACT inhaler Inhale 1-2 puffs into the lungs every 6 (six) hours as needed for wheezing or shortness of breath. 6.7 g 0   amitriptyline  (ELAVIL ) 10 MG tablet TAKE 1 TABLET BY MOUTH EVERYDAY AT BEDTIME 30 tablet 5   aspirin  EC 81 MG tablet Take 1 tablet (81 mg total) by mouth at bedtime. Okay to restart this medication on 07/03/2021 30  tablet 11   azelastine  (ASTELIN ) 0.1 % nasal spray Place 1 spray into both nostrils 2 (two) times daily. Use in each nostril as directed 30 mL 0   benzonatate  (TESSALON ) 100 MG capsule Take 1 capsule (100 mg total) by mouth 3 (three) times daily as needed for cough. Do not take with alcohol or while operating or driving heavy machinery 21 capsule 0   cabergoline (DOSTINEX) 0.5 MG tablet Take 0.25 mg by mouth 2 (two) times a week.     cefUROXime  (CEFTIN ) 500 MG tablet Take 1 tablet (500 mg total) by mouth 2 (two) times daily with a meal. 14 tablet 0   Cholecalciferol (VITAMIN D3) 2000 UNITS TABS Take 2,000 Units by mouth in the morning and at bedtime.     Coenzyme Q10 (COQ-10) 100 MG CAPS Take 100 mg by mouth in the morning.     finasteride (PROSCAR) 5 MG tablet Take 5 mg by mouth daily.     gabapentin  (NEURONTIN ) 100 MG capsule Take 1 capsule (100 mg total) by mouth 3 (three) times daily for 2 days, THEN 1 capsule (  100 mg total) 2 (two) times daily for 2 days, THEN 1 capsule (100 mg total) daily at 6 (six) AM for 2 days. 12 capsule 0   gabapentin  (NEURONTIN ) 300 MG capsule Take 1 capsule (300 mg total) by mouth 3 (three) times daily. 90 capsule 3   HYDROcodone  bit-homatropine (HYCODAN) 5-1.5 MG/5ML syrup Take 5 mLs by mouth every 8 (eight) hours as needed for cough. 120 mL 0   irbesartan  (AVAPRO ) 75 MG tablet Take 1 tablet (75 mg total) by mouth daily. 90 tablet 3   lidocaine  (XYLOCAINE ) 2 % solution Use as directed 10 mLs in the mouth or throat every 3 (three) hours as needed. 100 mL 0   nitroGLYCERIN  (NITROSTAT ) 0.4 MG SL tablet Place 1 tablet (0.4 mg total) under the tongue every 5 (five) minutes x 3 doses as needed for chest pain (if no relief after 2nd dose, proceed to ED or call 911). 25 tablet 3   Omega-3 Fatty Acids (OMEGA-3 FISH OIL PO) Take 1,000 mg by mouth in the morning.     omeprazole  (PRILOSEC ) 40 MG capsule Take 1 capsule (40 mg total) by mouth daily. 90 capsule 3   oseltamivir   (TAMIFLU ) 75 MG capsule Take 1 capsule (75 mg total) by mouth every 12 (twelve) hours. 10 capsule 0   oxyCODONE -acetaminophen  (PERCOCET) 10-325 MG tablet Take 1 tablet by mouth every 8 (eight) hours as needed for pain. 30 tablet 0   polyethylene glycol (MIRALAX / GLYCOLAX) 17 g packet Take 17 g by mouth as needed.     predniSONE  (DELTASONE ) 20 MG tablet Take 2 tablets (40 mg total) by mouth daily with breakfast for 5 days. 10 tablet 0   rosuvastatin  (CRESTOR ) 20 MG tablet Take 1 tablet (20 mg total) by mouth daily. 90 tablet 3   sucralfate  (CARAFATE ) 1 g tablet Take 1 tablet (1 g total) by mouth 4 (four) times daily -  with meals and at bedtime. Crush and dissolve in 10 mL warm water prior to swallowing, take 20 min before meals 120 tablet 0   tamsulosin  (FLOMAX ) 0.4 MG CAPS capsule Take 0.4 mg by mouth in the morning.     Testosterone  20.25 MG/ACT (1.62%) GEL Apply 2 Pump topically in the morning.     Tiotropium Bromide-Olodaterol (STIOLTO RESPIMAT ) 2.5-2.5 MCG/ACT AERS Inhale 2 puffs into the lungs daily. 4 g 5   vitamin C (ASCORBIC ACID) 500 MG tablet Take 500 mg by mouth in the morning.     Vitamin E (VITAMIN E/D-ALPHA NATURAL) 268 MG (400 UNIT) CAPS Take 1 tablet by mouth daily.     No current facility-administered medications for this visit.    Allergies as of 12/23/2023 - Review Complete 12/23/2023  Allergen Reaction Noted   Latex Dermatitis    Bromocriptine Nausea Only 06/27/2021   Zestril  [lisinopril ] Other (See Comments) 11/22/2019   Diovan  [valsartan ] Nausea Only 04/20/2021   Hydrochlorothiazide  Other (See Comments) 11/21/2016   Norvasc  [amlodipine ] Other (See Comments) 08/13/2012   Zocor [simvastatin]      Family History  Problem Relation Age of Onset   Diabetes Mother    Stroke Mother 43   Hypertension Mother    Heart attack Father 68   Leukemia Sister    Parkinson's disease Sister    Stroke Sister    Stroke Sister    Stroke Sister    Atrial fibrillation Sister     Prostate cancer Brother    Aortic aneurysm Brother        abdominal  Heart disease Brother    Atrial fibrillation Brother    Breast cancer Daughter    Cancer Maternal Uncle         X3:prostate , renal, bone    Colon cancer Paternal Uncle    Esophageal cancer Neg Hx    Rectal cancer Neg Hx    Stomach cancer Neg Hx     Review of Systems:    Constitutional: No weight loss, fever, chills, weakness or fatigue HEENT: Eyes: No change in vision               Ears, Nose, Throat:  No change in hearing or congestion Skin: No rash or itching Cardiovascular: No chest pain, chest pressure or palpitations   Respiratory: No SOB or cough Gastrointestinal: See HPI and otherwise negative Genitourinary: No dysuria or change in urinary frequency Neurological: No headache, dizziness or syncope Musculoskeletal: No new muscle or joint pain Hematologic: No bleeding or bruising Psychiatric: No history of depression or anxiety    Physical Exam:  Vital signs: BP (!) 140/80   Pulse 82   Ht 5' 11.5 (1.816 m)   Wt 199 lb (90.3 kg)   BMI 27.37 kg/m   Constitutional:   Pleasant male appears to be in NAD, Well developed, Well nourished, alert and cooperative Eyes:   PEERL, EOMI. No icterus. Conjunctiva pink. Neck:  Supple Throat: Oral cavity and pharynx without inflammation, swelling or lesion.  Respiratory: Respirations even and unlabored. Lungs clear to auscultation bilaterally.   No wheezes, crackles, or rhonchi.  Cardiovascular: Normal S1, S2. Regular rate and rhythm. No peripheral edema, cyanosis or pallor.  Gastrointestinal:  Soft, nondistended, nontender. No rebound or guarding. Normal bowel sounds. No appreciable masses or hepatomegaly. Rectal:  Not performed.  Msk:  Symmetrical without gross deformities. Without edema, no deformity or joint abnormality.  Neurologic:  Alert and  oriented x4;  grossly normal neurologically.  Skin:   Dry and intact without significant lesions or  rashes.  RELEVANT LABS AND IMAGING: CBC    Latest Ref Rng & Units 12/09/2023    1:43 PM 07/25/2023    8:53 AM 04/29/2023   10:05 AM  CBC  WBC 4.0 - 10.5 K/uL 4.7  4.1  4.9   Hemoglobin 13.0 - 17.0 g/dL 83.1  86.0  86.8   Hematocrit 39.0 - 52.0 % 49.1  40.7  38.2   Platelets 150 - 400 K/uL 161  154  136      CMP     Latest Ref Rng & Units 12/09/2023    4:15 PM 09/09/2023    8:49 AM 07/25/2023    8:53 AM  CMP  Glucose 70 - 99 mg/dL 899  95  98   BUN 8 - 23 mg/dL 16  19  25    Creatinine 0.61 - 1.24 mg/dL 9.06  8.93  8.99   Sodium 135 - 145 mmol/L 138  139  138   Potassium 3.5 - 5.1 mmol/L 4.5  4.3  4.3   Chloride 98 - 111 mmol/L 103  101  105   CO2 22 - 32 mmol/L 25  27  29    Calcium  8.9 - 10.3 mg/dL 9.5  8.5  8.8   Total Protein 6.5 - 8.1 g/dL   6.7   Total Bilirubin 0.0 - 1.2 mg/dL   0.6   Alkaline Phos 38 - 126 U/L   58   AST 15 - 41 U/L   18   ALT 0 - 44 U/L  14      Lab Results  Component Value Date   TSH 1.849 02/06/2023   Imaging Studies  CT Chest/Abd/Pelvis 12/2022 Developing abnormal lymph node along the thoracic inlet on the right side worrisome for malignant node. Recommend further evaluation. PET-CT scan Briseidy Spark be useful as the next step in the workup.   Otherwise stable posttreatment changes along the right upper thorax with scarring, pleural thickening.   Diffuse atherosclerotic disease of the aorta and branch vessels with the areas of significant stenosis seen particularly along the origin of the right renal artery and common iliac vessels. Please correlate with any particular symptoms. Additional workup as clinically appropriate.   Enlarged prostate with mass effect along the bladder. Please correlate with patient's PSA.   Few scattered colonic diverticula.     GI Procedures and Studies  EGD/colonoscopy 12/2022 EGD - 2 esophageal stenoses at 26 cm and GE junction status post savory dilation to 17 mm, small HH Colonoscopy - 3 sessile polyps (TA) in  TC , left colon diverticula, EH, IH   EGD 06/2022 1 superficial esophageal ulcer without bleeding 26 cm from the incisors, gastric erosion, small HH Path: Reactive gastropathy, acute esophagitis with ulceration, negative for CMV; HSV equivocal   EGD 03/2019 Patchy mildly erythematous mucosa without bleeding in the gastric body and antrum Path: Chronic gastritis, H. pylori negative   Colonoscopy 08/2016 3 sessile polyps (TA) in the DC, TC; sigmoid diverticula, IH   Colonoscopy 07/2005 Normal Path: GERD  Assessment: 78 y.o. male patient with:   Dysphagia, odynophagia, GERD, history esophageal ulcer d/t radiation, 2024 - R/O stricture, persistent esoph ulcer, esophagitis, candida esophagitis Personal history of adenomatous colon polyps History of periumbilical abdominal pain -possible Imfinzi  side effect History of erosive gastritis, 06/2022 -resolved on EGD 12/2022 Constipation  Mr. Arocho returns to the office today to follow-up multiple gastrointestinal issues outlined above.   Notes that his dysphagia and odynophagia related to radiation are improved since undergoing EGD with dilation in December 2024.  He has decreased his omeprazole  to 40 mg orally once daily.  Notes improvement in symptoms with use of OTC Throat Calm containing Echinacea.   His mid/epigastric abdominal pain that began in [place] 2024 and was refractory to PPI, Carafate  and FDgard has now improved on amitriptyline . He is awaiting follow-up scans in the future related to his lung cancer. We discussed continuing amitriptyline  in the long-term until he is stable from an oncology perspective.   Patient constipation managed with OTC Miralax and metamucil. We discussed titrating as needed with new addition of oxycodone  for pain management.     Plan: Continue omeprazole  40 mg p.o. once daily Continue Carafate  Continue Throat Calm as needed Continue amitriptyline  10 mg p.o. nightly. Monitor symptoms of dysphagia and  odynophagia with resumption of radiation therapy. Continue dietary modification, Metamucil and MiraLAX for management of constipation. Monitor weight and anthropometrics   -follow-up 6 mths with APP or Dr. Suzann   Thank you for the courtesy of this consult. Please call me with any questions or concerns.   Minie Roadcap, FNP-C [facility] 12/23/2023, 4:25 PM  Cc: Geofm Glade PARAS, MD  I have reviewed the clinic note as outlined by Cathryne Beal, NP and agree with the assessment, plan and medical decision making.  Mr. Devereux returns to the gastroenterology office today for follow-up of functional periumbilical abdominal pain that has responded to amitriptyline .  He is still noting beneficial effect and no changes will be made.  Has not had recurrent  issues with odynophagia or dysphagia.  Will continue current constipation regimen.  Inocente Hausen, MD

## 2023-12-23 NOTE — Patient Instructions (Signed)
 Continue omeprazole  40 mg p.o. once daily Continue Carafate  as needed Continue Throat Calm as needed Continue amitriptyline  10 mg p.o. nightly. Continue dietary modification, Metamucil and MiraLAX for management of constipation. Can titrate miralax to twice daily if needed.  Please don't hesitate to call office if any issues.

## 2023-12-24 ENCOUNTER — Ambulatory Visit (HOSPITAL_COMMUNITY): Admission: RE | Admit: 2023-12-24 | Discharge: 2023-12-24 | Attending: Neurology

## 2023-12-24 DIAGNOSIS — M546 Pain in thoracic spine: Secondary | ICD-10-CM | POA: Diagnosis present

## 2023-12-24 DIAGNOSIS — M5412 Radiculopathy, cervical region: Secondary | ICD-10-CM | POA: Diagnosis present

## 2023-12-24 DIAGNOSIS — C3491 Malignant neoplasm of unspecified part of right bronchus or lung: Secondary | ICD-10-CM | POA: Diagnosis present

## 2023-12-24 DIAGNOSIS — M5030 Other cervical disc degeneration, unspecified cervical region: Secondary | ICD-10-CM

## 2023-12-24 DIAGNOSIS — G959 Disease of spinal cord, unspecified: Secondary | ICD-10-CM

## 2023-12-24 MED ORDER — GADOBUTROL 1 MMOL/ML IV SOLN
9.0000 mL | Freq: Once | INTRAVENOUS | Status: AC | PRN
  Administered 2023-12-24: 10:00:00 9 mL via INTRAVENOUS

## 2023-12-25 ENCOUNTER — Ambulatory Visit: Payer: Self-pay | Admitting: Pulmonary Disease

## 2023-12-25 NOTE — Telephone Encounter (Signed)
 FYI Only or Action Required?: FYI only for provider: appointment scheduled on Jan 22, will CB if abx from UC do not help.  Patient is followed in Pulmonology for COPD, last seen on 10/16/2023 by Parrett, Madelin RAMAN, NP.  Called Nurse Triage reporting Shortness of Breath.  Symptoms began several days ago.  Interventions attempted: Prescription medications: Tessalon , cefuroximine and Increased fluids/rest.  Symptoms are: gradually improving.  Triage Disposition: See PCP Within 2 Weeks  Patient/caregiver understands and will follow disposition?: Yes  E2C2 Pulmonary Triage - Initial Assessment Questions Chief Complaint (e.g., cough, sob, wheezing, fever, chills, sweat or additional symptoms) *Go to specific symptom protocol after initial questions. SOB- slightly worse  How long have symptoms been present? 3 days   Have you tested for COVID or Flu? Note: If not, ask patient if a home test can be taken. If so, instruct patient to call back for positive results. No  MEDICINES:   Have you used any OTC meds to help with symptoms? No If yes, ask What medications? na  Have you used your inhalers/maintenance medication? Yes If yes, What medications? Tessalon  and cefuroximine  If inhaler, ask How many puffs and how often? Note: Review instructions on medication in the chart. na  OXYGEN: Do you wear supplemental oxygen? No If yes, How many liters are you supposed to use? na  Do you monitor your oxygen levels? No If yes, What is your reading (oxygen level) today? na  What is your usual oxygen saturation reading?  (Note: Pulmonary O2 sats should be 90% or greater) na   Copied from CRM #[id number]. Topic: Clinical - Red Word Triage >> Dec 25, 2023  2:49 PM Lavanda D wrote: Red Word that prompted transfer to Nurse Triage: SOB: Patient has been having some SOB + bubbling in his lungs & mucus he is not able to get up. Patient states that SOB may be related to him  having part of his lung removed a few years back. Reason for Disposition  [1] MILD longstanding difficulty breathing (e.g., minimal/no SOB at rest, SOB with walking, pulse < 100) AND [2] SAME as normal  Answer Assessment - Initial Assessment Questions For the last few months, patient has come down with multiple sinus infections and flu- he wil get better with antibiotics and then a week after, a new infection presents.  Still having trouble getting phlegm up. Breathing sounds wet and bubbly. 12/15 was given Tessalon  and Cerufoximine axetil at UC to treat most recent infection. Hasn't really seen improvement. Wants to give it a few days to see if it helps.   Appt made with Pulm provider for January. Advised to call back sooner if medication has no benefit. Had MRI on Monday for his thoracic cancer screening.   1. RESPIRATORY STATUS: Describe your breathing? (e.g., wheezing, shortness of breath, unable to speak, severe coughing)      Mild SOB/wet bubbly breathing with activity and laying down 2. ONSET: When did this breathing problem begin?      12/15 3. PATTERN Does the difficult breathing come and go, or has it been constant since it started?      Comes and goes with activity 4. SEVERITY: How bad is your breathing? (e.g., mild, moderate, severe)      mild 5. RECURRENT SYMPTOM: Have you had difficulty breathing before? If Yes, ask: When was the last time? and What happened that time?      Every 2 weeks with new infection 6. CARDIAC HISTORY: Do you have  any history of heart disease? (e.g., heart attack, angina, bypass surgery, angioplasty)      HTN, PAD 7. LUNG HISTORY: Do you have any history of lung disease?  (e.g., pulmonary embolus, asthma, emphysema)     Lobectomy, recurrent infections  8. CAUSE: What do you think is causing the breathing problem?      Recurrent sinus infections, Flu 9. OTHER SYMPTOMS: Do you have any other symptoms? (e.g., chest pain, cough,  dizziness, fever, runny nose)     Productive cough, wet breathing.  10. O2 SATURATION MONITOR:  Do you use an oxygen saturation monitor (pulse oximeter) at home? If Yes, ask: What is your reading (oxygen level) today? What is your usual oxygen saturation reading? (e.g., 95%)       UTA  Protocols used: Breathing Difficulty-A-AH

## 2023-12-26 ENCOUNTER — Telehealth: Payer: Self-pay | Admitting: Cardiology

## 2023-12-26 ENCOUNTER — Telehealth: Payer: Self-pay | Admitting: Neurology

## 2023-12-26 NOTE — Telephone Encounter (Signed)
 Returned call to [facility] and spoke to G who wanted to alert Dr. Tobie about patiens MRI cervical spine. Patients results are available and ready to view. Informed G that I would send message to Dr. Tobie.

## 2023-12-26 NOTE — Telephone Encounter (Signed)
 Spoke with pt who states recently diagnosed with the Flu by urgent care.  Pt currently remains on Prednisone , albuterol  inhaler and tessalon  pearls.  Pt denies current or recent CP, SOB or dizziness. He denies edema of extremities or abdomen. He reports hearing audible wheezing and that inhaler has not helped much.  He does have a non -productive cough but denies fever or chills. He states he is having difficulty sleeping at night due to cough and wheezing.  Negative CXR on 12/22/23.  Hx of right lobectomy of lung due to lung Ca. Pt advised current problem does not sound cardiac related and encouraged pt to follow up with his PCP regarding continued symptoms. Reviewed ED precautions.  Pt verbalizes understanding and thanked CHARITY FUNDRAISER for the call.

## 2023-12-26 NOTE — Telephone Encounter (Signed)
" °  Pt c/o of Chest Pain: STAT if active CP, including tightness, pressure, jaw pain, radiating pain to shoulder/upper arm/back, CP unrelieved by Nitro. Symptoms reported of SOB, nausea, vomiting, sweating.  1. Are you having CP right now?   Yes but worse at night when patient lays down  2. Are you experiencing any other symptoms (ex. SOB, nausea, vomiting, sweating)?   Sometime sweating in chest area  3. Is your CP continuous or coming and going?   Continuous  4. Have you taken Nitroglycerin ?   No  5. How long have you been experiencing CP? Patient stated for a couple of weeks   6. If NO CP at time of call then end call with telling Pt to call back or call 911 if Chest pain returns prior to return call from triage team.   Patient stated he is having chest bubbling which is worse at night.  Patient is concerned this may be related to congestion on his heart. "

## 2023-12-26 NOTE — Telephone Encounter (Signed)
 Lab called with report findings, [phone] -did not provide any details

## 2023-12-29 ENCOUNTER — Telehealth: Payer: Self-pay

## 2023-12-29 ENCOUNTER — Other Ambulatory Visit: Payer: Self-pay | Admitting: Internal Medicine

## 2023-12-29 ENCOUNTER — Ambulatory Visit (HOSPITAL_COMMUNITY)
Admission: RE | Admit: 2023-12-29 | Discharge: 2023-12-29 | Disposition: A | Discharge status: Home / Self Care | Source: Ambulatory Visit | Attending: Internal Medicine | Admitting: Internal Medicine

## 2023-12-29 ENCOUNTER — Ambulatory Visit (HOSPITAL_COMMUNITY): Admission: RE | Admit: 2023-12-29 | Discharge: 2023-12-29 | Attending: Internal Medicine | Admitting: Internal Medicine

## 2023-12-29 DIAGNOSIS — C3490 Malignant neoplasm of unspecified part of unspecified bronchus or lung: Secondary | ICD-10-CM

## 2023-12-29 LAB — GLUCOSE, CAPILLARY: Glucose-Capillary: 107 mg/dL — ABNORMAL HIGH (ref 70–99)

## 2023-12-29 MED ORDER — FLUDEOXYGLUCOSE F - 18 (FDG) INJECTION
10.0000 | Freq: Once | INTRAVENOUS | Status: AC
  Administered 2023-12-29: 10.2 via INTRAVENOUS

## 2023-12-29 MED ORDER — OXYCODONE-ACETAMINOPHEN 10-325 MG PO TABS: 1.0000 | ORAL_TABLET | Freq: Three times a day (TID) | ORAL | 0 refills | Status: DC | PRN

## 2023-12-29 MED ORDER — GADOBUTROL 1 MMOL/ML IV SOLN
9.0000 mL | Freq: Once | INTRAVENOUS | Status: AC | PRN
  Administered 2023-12-29: 9 mL via INTRAVENOUS

## 2023-12-29 MED ORDER — HEPARIN SOD (PORK) LOCK FLUSH 100 UNIT/ML IV SOLN
500.0000 [IU] | Freq: Once | INTRAVENOUS | Status: AC
  Administered 2023-12-29: 500 [IU] via INTRAVENOUS

## 2023-12-29 NOTE — Telephone Encounter (Signed)
 Imaging was viewed.  I have called patient and informed him of the thoracic mass seen on imaging which is causing impingement of the upper thoracic nerve roots as well as the right brachial plexus.  He is scheduled to have PET scan today and follow-up with oncologist next week.  Pain is controlled on his current regiment.

## 2023-12-29 NOTE — Telephone Encounter (Signed)
Pt called requesting refill on Percocet.

## 2024-01-06 ENCOUNTER — Telehealth: Payer: Self-pay

## 2024-01-06 ENCOUNTER — Inpatient Hospital Stay

## 2024-01-06 DIAGNOSIS — C3411 Malignant neoplasm of upper lobe, right bronchus or lung: Secondary | ICD-10-CM | POA: Diagnosis not present

## 2024-01-06 DIAGNOSIS — C3490 Malignant neoplasm of unspecified part of unspecified bronchus or lung: Secondary | ICD-10-CM

## 2024-01-06 LAB — CBC WITH DIFFERENTIAL (CANCER CENTER ONLY)
Abs Immature Granulocytes: 0.03 K/uL (ref 0.00–0.07)
Basophils Absolute: 0 K/uL (ref 0.0–0.1)
Basophils Relative: 1 %
Eosinophils Absolute: 0.1 K/uL (ref 0.0–0.5)
Eosinophils Relative: 3 %
HCT: 43.3 % (ref 39.0–52.0)
Hemoglobin: 14.9 g/dL (ref 13.0–17.0)
Immature Granulocytes: 1 %
Lymphocytes Relative: 17 %
Lymphs Abs: 0.8 K/uL (ref 0.7–4.0)
MCH: 32 pg (ref 26.0–34.0)
MCHC: 34.4 g/dL (ref 30.0–36.0)
MCV: 92.9 fL (ref 80.0–100.0)
Monocytes Absolute: 0.6 K/uL (ref 0.1–1.0)
Monocytes Relative: 13 %
Neutro Abs: 3 K/uL (ref 1.7–7.7)
Neutrophils Relative %: 65 %
Platelet Count: 169 K/uL (ref 150–400)
RBC: 4.66 MIL/uL (ref 4.22–5.81)
RDW: 13.2 % (ref 11.5–15.5)
WBC Count: 4.5 K/uL (ref 4.0–10.5)
nRBC: 0 % (ref 0.0–0.2)

## 2024-01-06 LAB — CMP (CANCER CENTER ONLY)
ALT: 14 U/L (ref 0–44)
AST: 20 U/L (ref 15–41)
Albumin: 3.9 g/dL (ref 3.5–5.0)
Alkaline Phosphatase: 65 U/L (ref 38–126)
Anion gap: 10 (ref 5–15)
BUN: 15 mg/dL (ref 8–23)
CO2: 25 mmol/L (ref 22–32)
Calcium: 9.2 mg/dL (ref 8.9–10.3)
Chloride: 102 mmol/L (ref 98–111)
Creatinine: 1.18 mg/dL (ref 0.61–1.24)
GFR, Estimated: >60 mL/min
Glucose, Bld: 99 mg/dL (ref 70–99)
Potassium: 4.5 mmol/L (ref 3.5–5.1)
Sodium: 136 mmol/L (ref 135–145)
Total Bilirubin: 0.4 mg/dL (ref 0.0–1.2)
Total Protein: 7.2 g/dL (ref 6.5–8.1)

## 2024-01-06 NOTE — Telephone Encounter (Signed)
 Copied from CRM [id number]. Topic: Referral - Question >> Jan 06, 2024  3:35 PM Brittany M wrote: Reason for CRM: Patient looking to get a referral to a Dermatologist - he has appointments set in June but needs a referral. Please contact patient

## 2024-01-07 ENCOUNTER — Inpatient Hospital Stay: Admitting: Internal Medicine

## 2024-01-07 VITALS — BP 121/73 | HR 78 | Temp 97.8°F | Resp 17 | Ht 71.5 in | Wt 193.0 lb

## 2024-01-07 DIAGNOSIS — C3411 Malignant neoplasm of upper lobe, right bronchus or lung: Secondary | ICD-10-CM | POA: Diagnosis not present

## 2024-01-07 DIAGNOSIS — C3491 Malignant neoplasm of unspecified part of right bronchus or lung: Secondary | ICD-10-CM | POA: Diagnosis not present

## 2024-01-07 MED ORDER — DOXYCYCLINE HYCLATE 100 MG PO TABS: 100.0000 mg | ORAL_TABLET | Freq: Two times a day (BID) | ORAL | 0 refills | Status: AC

## 2024-01-07 NOTE — Progress Notes (Signed)
 "     Patrick [phone]   [phone]  OFFICE PROGRESS NOTE  Patrick Harris, Patrick Harris [address] [place] [address]  DIAGNOSIS: Recurrent and progressive non-small cell lung cancer initially diagnosed as stage IIIA (T1c, N 2, M0) adenocarcinoma in the right upper lobe as well as stage IA (T1c, N0, M0) squamous cell carcinoma involving the right upper lobe and stage IA (T1b, N0, M0) adenocarcinoma involving the left upper lobe diagnosed in June 2023.  He has evidence for disease progression with enlarging right paratracheal lymph node with impingement on the upper thoracic nerve roots in December 2025.  PRIOR THERAPY:  1) status post neoadjuvant systemic chemotherapy with carboplatin , paclitaxel  and nivolumab  for 3 cycles in [place]  followed by right upper lobectomy with lymph node dissection at [facility] under the care of Dr. Valli with the final pathology showing residual disease with synchronous adenocarcinoma as well as squamous cell carcinoma in the right upper lobe and multiple mediastinal lymph node involvement performed on November 13, 2021. 2)  evidence for disease recurrence with involvement of 4R lymphadenopathy consistent with metastatic adenocarcinoma on February 26, 2022 biopsy. Molecular studies showed no actionable mutations and PD-L1 expression was 15% in the adenocarcinoma in 2% in the squamous cell carcinoma. 3) Concurrent chemotherapy with carboplatin  for AUC of 5 and Alimta  500 Mg/M2 every 3 weeks for 2 cycles during the course of radiotherapy.  Last dose was giving 04/08/2022. 4) Consolidation treatment with immunotherapy with Imfinzi  1500 Mg IV every 4 weeks.  First dose May 29, 2022 status post 1 cycle.  This treatment was discontinued secondary to intolerance 5) palliative radiotherapy to the recurrent disease right paratracheal lymph node under the care of Dr. Shannon completed March 17, 2023.  CURRENT THERAPY:  Observation  INTERVAL HISTORY: [name] 78 y.o. male returns to the clinic today for follow-up visit accompanied by his wife. Discussed the use of AI scribe software for clinical note transcription with the patient, who gave verbal consent to proceed.  History of Present Illness Patrick Harris is a 78 year old male with recurrent, progressive non-small cell lung cancer who presents for evaluation of symptomatic disease progression and cancer-related neuropathic pain.  He was initially diagnosed with stage 3A non-small cell lung cancer of the right lung in June 2025 and completed concurrent chemoradiation. He was unable to tolerate immunotherapy. In March 2025, he received radiotherapy to the right paratracheal lymph node. Recent PET scan showed an increase in the size of the right paratracheal mass and a decrease in its metabolic activity compared to prior imaging. MRI brain did not show any new lesions. Imaging showed a mass in the right paratracheal region adjacent to the upper thoracic spine.  He experiences severe right arm pain with burning, stinging, and deep throbbing sensations in the shoulder, back, and elbow. Pain is severe enough to require oxycodone  for sleep. He takes gabapentin  300 mg three times daily with partial relief; discontinuation worsens symptoms. Pain fluctuates in severity and significantly impairs quality of life.  He has recurrent episodes of chest congestion, treated four times with antibiotics, with only temporary improvement. Imaging revealed new consolidations in the left upper lobe and bilateral lower lobes. He denies chest pain, shortness of breath, or hemoptysis. He expresses concern regarding infection control and possible etiologies such as aspiration or immunosuppression.  Recent imaging also showed increased activity in the spine and left sacrum without a corresponding mass. He has chronic back pain  but no new neurological deficits or musculoskeletal  complaints outside of the described neuropathic pain.    MEDICAL HISTORY: Past Medical History:  Diagnosis Date   Arthritis    CAD (coronary artery disease)    CABG with LIMA to LAD and RIMA to RCA August 2000 - Dr. Kerrin   Cancer [facility])    basal cell   CHF (congestive heart failure) (HCC)    COPD (chronic obstructive pulmonary disease) (HCC)    Elevated glycated hemoglobin    Essential hypertension    GERD (gastroesophageal reflux disease)    Heart murmur    History of radiation therapy    Right lung(chest) 03/19/22-05/06/22- Dr. Lynwood Nasuti   History of radiation therapy    Right Lung- 02/25/23-03/17/23- Dr. Lynwood Nasuti   Hyperlipidemia    Hypopituitarism    Lung cancer [facility])    Osteopenia    Dr Loetta   Pancreatitis    Peyronie disease    Shingles    Testosterone  deficiency    Dr Loetta    ALLERGIES:  is allergic to latex, bromocriptine, zestril  [lisinopril ], diovan  [valsartan ], hydrochlorothiazide , norvasc  [amlodipine ], and zocor [simvastatin].  MEDICATIONS:  Current Outpatient Medications  Medication Sig Dispense Refill   albuterol  (VENTOLIN  HFA) 108 (90 Base) MCG/ACT inhaler Inhale 1-2 puffs into the lungs every 6 (six) hours as needed for wheezing or shortness of breath. 6.7 g 0   amitriptyline  (ELAVIL ) 10 MG tablet TAKE 1 TABLET BY MOUTH EVERYDAY AT BEDTIME 30 tablet 5   aspirin  EC 81 MG tablet Take 1 tablet (81 mg total) by mouth at bedtime. Okay to restart this medication on 07/03/2021 30 tablet 11   azelastine  (ASTELIN ) 0.1 % nasal spray Place 1 spray into both nostrils 2 (two) times daily. Use in each nostril as directed 30 mL 0   benzonatate  (TESSALON ) 100 MG capsule Take 1 capsule (100 mg total) by mouth 3 (three) times daily as needed for cough. Do not take with alcohol or while operating or driving heavy machinery 21 capsule 0   cabergoline (DOSTINEX) 0.5 MG tablet Take 0.25 mg by mouth 2 (two) times a week.     cefUROXime  (CEFTIN ) 500 MG tablet Take 1  tablet (500 mg total) by mouth 2 (two) times daily with a meal. 14 tablet 0   Cholecalciferol (VITAMIN D3) 2000 UNITS TABS Take 2,000 Units by mouth in the morning and at bedtime.     Coenzyme Q10 (COQ-10) 100 MG CAPS Take 100 mg by mouth in the morning.     finasteride (PROSCAR) 5 MG tablet Take 5 mg by mouth daily.     gabapentin  (NEURONTIN ) 100 MG capsule Take 1 capsule (100 mg total) by mouth 3 (three) times daily for 2 days, THEN 1 capsule (100 mg total) 2 (two) times daily for 2 days, THEN 1 capsule (100 mg total) daily at 6 (six) AM for 2 days. 12 capsule 0   gabapentin  (NEURONTIN ) 300 MG capsule Take 1 capsule (300 mg total) by mouth 3 (three) times daily. 90 capsule 3   HYDROcodone  bit-homatropine (HYCODAN) 5-1.5 MG/5ML syrup Take 5 mLs by mouth every 8 (eight) hours as needed for cough. 120 mL 0   irbesartan  (AVAPRO ) 75 MG tablet Take 1 tablet (75 mg total) by mouth daily. 90 tablet 3   lidocaine  (XYLOCAINE ) 2 % solution Use as directed 10 mLs in the mouth or throat every 3 (three) hours as needed. 100 mL 0   nitroGLYCERIN  (NITROSTAT ) 0.4 MG SL tablet Place 1  tablet (0.4 mg total) under the tongue every 5 (five) minutes x 3 doses as needed for chest pain (if no relief after 2nd dose, proceed to ED or call 911). 25 tablet 3   Omega-3 Fatty Acids (OMEGA-3 FISH OIL PO) Take 1,000 mg by mouth in the morning.     omeprazole  (PRILOSEC ) 40 MG capsule Take 1 capsule (40 mg total) by mouth daily. 90 capsule 3   oseltamivir  (TAMIFLU ) 75 MG capsule Take 1 capsule (75 mg total) by mouth every 12 (twelve) hours. 10 capsule 0   oxyCODONE -acetaminophen  (PERCOCET) 10-325 MG tablet Take 1 tablet by mouth every 8 (eight) hours as needed for pain. 30 tablet 0   polyethylene glycol (MIRALAX / GLYCOLAX) 17 g packet Take 17 g by mouth as needed.     rosuvastatin  (CRESTOR ) 20 MG tablet Take 1 tablet (20 mg total) by mouth daily. 90 tablet 3   sucralfate  (CARAFATE ) 1 g tablet Take 1 tablet (1 g total) by mouth 4  (four) times daily -  with meals and at bedtime. Crush and dissolve in 10 mL warm water prior to swallowing, take 20 min before meals 120 tablet 0   tamsulosin  (FLOMAX ) 0.4 MG CAPS capsule Take 0.4 mg by mouth in the morning.     Testosterone  20.25 MG/ACT (1.62%) GEL Apply 2 Pump topically in the morning.     Tiotropium Bromide-Olodaterol (STIOLTO RESPIMAT ) 2.5-2.5 MCG/ACT AERS Inhale 2 puffs into the lungs daily. 4 g 5   vitamin C (ASCORBIC ACID) 500 MG tablet Take 500 mg by mouth in the morning.     Vitamin E (VITAMIN E/D-ALPHA NATURAL) 268 MG (400 UNIT) CAPS Take 1 tablet by mouth daily.     No current facility-administered medications for this visit.    SURGICAL HISTORY:  Past Surgical History:  Procedure Laterality Date   Bone spur  2006   5th toe bilaterally   BRONCHIAL BIOPSY  07/02/2021   Procedure: BRONCHIAL BIOPSIES;  Surgeon: Shelah Lamar RAMAN, Patrick Harris;  Location: [facility] ENDOSCOPY;  Service: Pulmonary;;   BRONCHIAL BRUSHINGS  07/02/2021   Procedure: BRONCHIAL BRUSHINGS;  Surgeon: Shelah Lamar RAMAN, Patrick Harris;  Location: [facility] ENDOSCOPY;  Service: Pulmonary;;   BRONCHIAL NEEDLE ASPIRATION BIOPSY  07/02/2021   Procedure: BRONCHIAL NEEDLE ASPIRATION BIOPSIES;  Surgeon: Shelah Lamar RAMAN, Patrick Harris;  Location: [facility] ENDOSCOPY;  Service: Pulmonary;;   COLONOSCOPY  2007   negative; Dr Jakie   CORONARY ARTERY BYPASS GRAFT  2001   2 vessels   FIDUCIAL MARKER PLACEMENT  07/02/2021   Procedure: FIDUCIAL MARKER PLACEMENT;  Surgeon: Shelah Lamar RAMAN, Patrick Harris;  Location: [facility] ENDOSCOPY;  Service: Pulmonary;;   fracture heel  1967   Bil/ due to fall off ladder   Fractured calcaneus  1969   bilaterally w/ ankle fusion   HERNIA REPAIR  1984   right inguinal hernia   KNEE SURGERY Left    LOBECTOMY Right 11/13/2021   MENISECTOMY  2007   R medial, Dr. Barbarann   PORTA CATH INSERTION     SHOULDER SURGERY  1994   left   UPPER GASTROINTESTINAL ENDOSCOPY  2007   GERD, Dr Jakie   VASECTOMY     VIDEO BRONCHOSCOPY WITH RADIAL  ENDOBRONCHIAL ULTRASOUND  07/02/2021   Procedure: VIDEO BRONCHOSCOPY WITH RADIAL ENDOBRONCHIAL ULTRASOUND;  Surgeon: Shelah Lamar RAMAN, Patrick Harris;  Location: [facility] ENDOSCOPY;  Service: Pulmonary;;    REVIEW OF SYSTEMS:  Constitutional: negative Eyes: negative Ears, nose, mouth, throat, and face: negative Respiratory: positive for pleurisy/chest pain Cardiovascular: negative  Gastrointestinal: negative Genitourinary:negative Integument/breast: negative Hematologic/lymphatic: negative Musculoskeletal:positive for bone pain and neck pain Neurological: positive for paresthesia Behavioral/Psych: negative Endocrine: negative Allergic/Immunologic: negative   PHYSICAL EXAMINATION: General appearance: alert, cooperative, fatigued, and no distress Head: Normocephalic, without obvious abnormality, atraumatic Neck: no adenopathy, no JVD, supple, symmetrical, trachea midline, and thyroid  not enlarged, symmetric, no tenderness/mass/nodules Lymph nodes: Cervical, supraclavicular, and axillary nodes normal. Resp: clear to auscultation bilaterally Back: symmetric, no curvature. ROM normal. No CVA tenderness. Cardio: regular rate and rhythm, S1, S2 normal, no murmur, click, rub or gallop GI: soft, non-tender; bowel sounds normal; no masses,  no organomegaly Extremities: extremities normal, atraumatic, no cyanosis or edema Neurologic: Alert and oriented X 3, normal strength and tone. Normal symmetric reflexes. Normal coordination and gait  ECOG PERFORMANCE STATUS: 1 - Symptomatic but completely ambulatory  Blood pressure 121/73, pulse 78, temperature 97.8 F (36.6 C), temperature source Temporal, resp. rate 17, height 5' 11.5 (1.816 m), weight 193 lb (87.5 kg), SpO2 95%.  LABORATORY DATA: Lab Results  Component Value Date   WBC 4.5 01/06/2024   HGB 14.9 01/06/2024   HCT 43.3 01/06/2024   MCV 92.9 01/06/2024   PLT 169 01/06/2024      Chemistry      Component Value Date/Time   NA 136 01/06/2024 0948    K 4.5 01/06/2024 0948   CL 102 01/06/2024 0948   CO2 25 01/06/2024 0948   BUN 15 01/06/2024 0948   CREATININE 1.18 01/06/2024 0948   CREATININE 1.07 08/25/2019 0847      Component Value Date/Time   CALCIUM  9.2 01/06/2024 0948   ALKPHOS 65 01/06/2024 0948   AST 20 01/06/2024 0948   ALT 14 01/06/2024 0948   BILITOT 0.4 01/06/2024 0948       RADIOGRAPHIC STUDIES: NM PET Image Restage (PS) Skull Base to Thigh (F-18 FDG) Result Date: 01/07/2024 CLINICAL DATA:  Subsequent treatment strategy for non-small cell lung cancer. EXAM: NUCLEAR MEDICINE PET SKULL BASE TO THIGH TECHNIQUE: 10.2 mCi F-18 FDG was injected intravenously. Full-ring PET imaging was performed from the skull base to thigh after the radiotracer. CT data was obtained and used for attenuation correction and anatomic localization. Fasting blood glucose: 107 mg/dl COMPARISON:  CT chest abdomen pelvis 12/10/2023 and PET 01/30/2023. FINDINGS: Mediastinal blood pool activity: SUV max 2.4 Liver activity: SUV max NA NECK: Minimal asymmetric left oro pharyngeal hypermetabolism, SUV max 4.2, without a definitive CT correlate. No hypermetabolic lymph nodes. Incidental CT findings: Cerebral atrophy. CHEST: High right paratracheal/paraspinal nodal mass measures 2.3 x 4.1 cm, SUV max 11.1, previously 13.8. New areas of subpleural consolidation in the lower lobes (7/45 and 50, with associated hypermetabolism. Areas of hypermetabolism along the atria, without CT correlate. Incidental CT findings: Right IJ Port-A-Cath terminates in the high right atrium. Atherosclerotic calcification of the aorta, aortic valve and coronary arteries. Heart is at the upper limits of normal in size. No pericardial or pleural effusion. Right upper lobectomy. Post radiation parenchymal retraction and volume loss in the medial right hemithorax. Centrilobular and paraseptal emphysema. ABDOMEN/PELVIS: No abnormal hypermetabolism. Incidental CT findings: Contrast excretion from  the kidneys related to recent MR brain with contrast. SKELETON: Increasing focal hypermetabolism in the medial left iliac wing, SUV max 5.0, previously 3.5, without a CT correlate. Patchy hypermetabolism in the spine. Otherwise, no abnormal hypermetabolism. Incidental CT findings: Degenerative changes in the spine. IMPRESSION: 1. High right paratracheal/paraspinal nodal mass has decrease in hypermetabolism but has increased in size, indicative of disease progression. Corresponding thoracic spine  MR shows extension into the right T1-T2 neural foramen. 2. Areas of consolidation in the left upper and bilateral lower lobes, new in the interval from 12/10/2023, suggesting an infectious/inflammatory etiology. Consider short-term follow-up CT chest without contrast in 3-4 weeks, in further evaluation, as clinically indicated. 3. Patchy hypermetabolism in the spine and left sacrum, likely treatment related. 4. Aortic atherosclerosis (ICD10-I70.0). Coronary artery calcification. 5.  Emphysema (ICD10-J43.9). Electronically Signed   By: Newell Eke M.D.   On: 01/07/2024 10:02   MR BRAIN W WO CONTRAST Result Date: 01/06/2024 EXAM: MRI BRAIN WITH AND WITHOUT CONTRAST 12/29/2023 03:41:13 PM TECHNIQUE: Multiplanar multisequence MRI of the head/brain was performed with and without the administration of intravenous contrast. CONTRAST: 9 mL of Gadobutrol . COMPARISON: MRI Head 01/26/2023. CLINICAL HISTORY: Non-small cell lung cancer (NSCLC), staging. FINDINGS: BRAIN AND VENTRICLES: No acute infarct. No acute intracranial hemorrhage. No mass effect or midline shift. No hydrocephalus. The sella is unremarkable. Normal flow voids. No mass or abnormal enhancement. ORBITS: No acute abnormality. SINUSES: No acute abnormality. BONES AND SOFT TISSUES: Normal bone marrow signal and enhancement. No acute soft tissue abnormality. IMPRESSION: 1. No acute intracranial abnormality. 2. No mass or abnormal enhancement. Electronically signed  by: Franky Stanford Patrick Harris 01/06/2024 10:05 PM EST RP Workstation: [id number]   MR CERVICAL SPINE W WO CONTRAST Addendum Date: 12/26/2023 ADDENDUM REPORT: 12/26/2023 12:26 ADDENDUM: The right paraspinal lesion at T1-T2 is more completely imaged on the thoracic spine MRI and measures 3.9 x 3.1 x 4.4 cm Electronically Signed   By: Nancyann Burns M.D.   On: 12/26/2023 12:26   Result Date: 12/26/2023 CLINICAL DATA:  Lung cancer, new onset right paresthesias and pain EXAM: MRI CERVICAL SPINE WITHOUT AND WITH CONTRAST TECHNIQUE: Multiplanar and multiecho pulse sequences of the cervical spine, to include the craniocervical junction and cervicothoracic junction, were obtained without and with intravenous contrast. CONTRAST:  9mL GADAVIST  GADOBUTROL  1 MMOL/ML IV SOLN COMPARISON:  March 08, 2020 FINDINGS: The craniocervical junction is normal. There is no significant bone marrow signal abnormality. The cervical spinal cord is normal. There is an incompletely imaged mass on the right side at the T1 and T2 levels. It appears to extend into the neural foramen on the right at T1-T2. There is involvement of the brachial plexus. C2-C3: Mild degenerative disc disease. Ankylosis of the right facet joint. No spinal stenosis or foraminal stenosis C3-C4: Moderate degenerative disc disease and facet arthropathy. No spinal stenosis or foraminal stenosis C4-C5: Desiccation of the disc with preservation of the disc height. There is moderate right and severe left facet arthropathy. No spinal stenosis or foraminal stenosis C5-C6: Moderate degenerative disc disease with mild disc bulge. No significant facet disease. No spinal stenosis or foraminal stenosis C6-C7: Moderate degenerative disc disease. No significant facet disease. No spinal stenosis or foraminal stenosis C7-T1: Moderate degenerative disc disease. No significant facet disease. No spinal stenosis or foraminal stenosis IMPRESSION: Incompletely imaged approximately 3.3 x 4.5 x 1.7 cm  right paraspinal mass at T1-T2 that extends into the neural foramen on the right at T1-T2 and involves the brachial plexus. Multilevel degenerative changes without spinal stenosis or disc herniation Electronically Signed: By: Nancyann Burns M.D. On: 12/26/2023 12:20   MR THORACIC SPINE W WO CONTRAST Result Date: 12/26/2023 CLINICAL DATA:  Lung cancer with right paresthesias and pain EXAM: MRI THORACIC WITHOUT AND WITH CONTRAST TECHNIQUE: Multiplanar and multiecho pulse sequences of the thoracic spine were obtained without and with intravenous contrast. CONTRAST:  9mL GADAVIST  GADOBUTROL  1 MMOL/ML IV  SOLN COMPARISON:  None Available. FINDINGS: Alignment: Normal Bone marrow signal: No significant abnormality Thoracic spinal cord: Normal Facet joints: No significant abnormality Intervertebral discs: No significant abnormality Paraspinal tissues: There is a 3.9 x 3.1 by 4.4 cm right paraspinal mass at the T1-T2 level with extension into the right T1-T2 neural foramen and involvement of the brachial plexus. IMPRESSION: There is a 3.9 x 3.1 by 4.4 cm right paraspinal mass at the T1-T2 level with extension into the right T1-T2 neural foramen and involvement of the brachial plexus. Electronically Signed   By: Nancyann Burns M.D.   On: 12/26/2023 12:24   DG Chest 2 View Result Date: 12/22/2023 CLINICAL DATA:  Cough and wheezing. EXAM: DG CHEST 2V COMPARISON:  12/09/2023 and CT chest 12/10/2023. FINDINGS: Right IJ Port-A-Cath tip is in the SVC. Rightward shift of the mediastinum secondary to right upper lobectomy and post treatment scarring. Overall volume loss in the right hemithorax with elevation of the right hemidiaphragm. Left lung is clear. Left apical pleural thickening. No pleural fluid. IMPRESSION: Right upper lobectomy.  No acute findings. Electronically Signed   By: Newell Eke M.D.   On: 12/22/2023 11:41   CT Chest W Contrast Result Date: 12/10/2023 CLINICAL DATA:  Non-small cell lung cancer.  *  Tracking Code: BO * EXAM: CT CHEST, ABDOMEN, AND PELVIS WITH CONTRAST TECHNIQUE: Multidetector CT imaging of the chest, abdomen and pelvis was performed following the standard protocol during bolus administration of intravenous contrast. RADIATION DOSE REDUCTION: This exam was performed according to the departmental dose-optimization program which includes automated exposure control, adjustment of the mA and/or kV according to patient size and/or use of iterative reconstruction technique. CONTRAST:  OMNIPAQUE  IOHEXOL  300 MG/ML  SOLN COMPARISON:  07/25/2023. FINDINGS: CT CHEST FINDINGS Cardiovascular: Right IJ Port-A-Cath terminates in the low SVC. Atherosclerotic calcification of the aorta, aortic valve and coronary arteries. Heart size normal. No pericardial effusion. Mediastinum/Nodes: Enlarging high right paratracheal nodal mass measures 2.8 x 3.8 cm (2/6), previously 1.1 x 1.5 cm. There may be invasion of the adjacent T2 right neural foramen (2/4). No additional pathologically enlarged mediastinal, hilar or axillary lymph nodes. Esophagus is grossly unremarkable. Lungs/Pleura: Centrilobular and paraseptal emphysema. Right upper lobectomy. Perihilar post treatment pulmonary retraction, bronchiectasis and architectural distortion in the right lung, as before. Possible mild basilar subpleural coarsening ground-glass and reticulation. No pleural fluid. Minimal adherent debris in the airway. Musculoskeletal: Degenerative changes in the spine. No worrisome lytic or sclerotic lesions. CT ABDOMEN PELVIS FINDINGS Hepatobiliary: Liver and gallbladder are unremarkable. No biliary ductal dilatation. Pancreas: Negative. Spleen: Negative. Adrenals/Urinary Tract: Adrenal glands are unremarkable. Subcentimeter low-attenuation lesions in the kidneys are too small to characterize. No specific follow-up necessary. Kidneys are otherwise unremarkable. Ureters are decompressed. An enlarged prostate indents the bladder.  Stomach/Bowel: Stomach, small bowel, appendix and colon are unremarkable. Vascular/Lymphatic: Atherosclerotic calcification of the aorta. No pathologically enlarged lymph nodes. Reproductive: Prostate is enlarged and indents the bladder. Other: Small bilateral inguinal hernias contain fat. No free fluid. Mesenteries and peritoneum are unremarkable. Musculoskeletal: Degenerative changes in the spine. No worrisome lytic or sclerotic lesions. IMPRESSION: 1. Enlarging high right paratracheal nodal mass. Difficult to exclude invasion of the adjacent right T2 neural foramen. 2. Possible mild basilar interstitial lung abnormality. 3. Enlarged prostate. 4. Aortic atherosclerosis (ICD10-I70.0). Coronary artery calcification. 5.  Emphysema (ICD10-J43.9). Electronically Signed   By: Newell Eke M.D.   On: 12/10/2023 11:32   CT ABDOMEN PELVIS W CONTRAST Result Date: 12/10/2023 CLINICAL DATA:  Non-small  cell lung cancer.  * Tracking Code: BO * EXAM: CT CHEST, ABDOMEN, AND PELVIS WITH CONTRAST TECHNIQUE: Multidetector CT imaging of the chest, abdomen and pelvis was performed following the standard protocol during bolus administration of intravenous contrast. RADIATION DOSE REDUCTION: This exam was performed according to the departmental dose-optimization program which includes automated exposure control, adjustment of the mA and/or kV according to patient size and/or use of iterative reconstruction technique. CONTRAST:  OMNIPAQUE  IOHEXOL  300 MG/ML  SOLN COMPARISON:  07/25/2023. FINDINGS: CT CHEST FINDINGS Cardiovascular: Right IJ Port-A-Cath terminates in the low SVC. Atherosclerotic calcification of the aorta, aortic valve and coronary arteries. Heart size normal. No pericardial effusion. Mediastinum/Nodes: Enlarging high right paratracheal nodal mass measures 2.8 x 3.8 cm (2/6), previously 1.1 x 1.5 cm. There may be invasion of the adjacent T2 right neural foramen (2/4). No additional pathologically enlarged  mediastinal, hilar or axillary lymph nodes. Esophagus is grossly unremarkable. Lungs/Pleura: Centrilobular and paraseptal emphysema. Right upper lobectomy. Perihilar post treatment pulmonary retraction, bronchiectasis and architectural distortion in the right lung, as before. Possible mild basilar subpleural coarsening ground-glass and reticulation. No pleural fluid. Minimal adherent debris in the airway. Musculoskeletal: Degenerative changes in the spine. No worrisome lytic or sclerotic lesions. CT ABDOMEN PELVIS FINDINGS Hepatobiliary: Liver and gallbladder are unremarkable. No biliary ductal dilatation. Pancreas: Negative. Spleen: Negative. Adrenals/Urinary Tract: Adrenal glands are unremarkable. Subcentimeter low-attenuation lesions in the kidneys are too small to characterize. No specific follow-up necessary. Kidneys are otherwise unremarkable. Ureters are decompressed. An enlarged prostate indents the bladder. Stomach/Bowel: Stomach, small bowel, appendix and colon are unremarkable. Vascular/Lymphatic: Atherosclerotic calcification of the aorta. No pathologically enlarged lymph nodes. Reproductive: Prostate is enlarged and indents the bladder. Other: Small bilateral inguinal hernias contain fat. No free fluid. Mesenteries and peritoneum are unremarkable. Musculoskeletal: Degenerative changes in the spine. No worrisome lytic or sclerotic lesions. IMPRESSION: 1. Enlarging high right paratracheal nodal mass. Difficult to exclude invasion of the adjacent right T2 neural foramen. 2. Possible mild basilar interstitial lung abnormality. 3. Enlarged prostate. 4. Aortic atherosclerosis (ICD10-I70.0). Coronary artery calcification. 5.  Emphysema (ICD10-J43.9). Electronically Signed   By: Newell Eke M.D.   On: 12/10/2023 11:32   DG Chest 2 View Result Date: 12/09/2023 EXAM: 2 VIEW(S) XRAY OF THE CHEST 12/09/2023 01:45:02 PM COMPARISON: 07/25/2023 and 06/02/2022. CLINICAL HISTORY: chest pain FINDINGS: LINES, TUBES  AND DEVICES: Right internal jugular port-a-cath is unchanged. LUNGS AND PLEURA: Status post right lobectomy. Stable probable scarring seen in right lung apex. No pleural effusion. No pneumothorax. HEART AND MEDIASTINUM: No acute abnormality of the cardiac and mediastinal silhouettes. BONES AND SOFT TISSUES: Sternotomy wires are noted. No acute osseous abnormality. IMPRESSION: 1. No acute cardiopulmonary process. 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 The patient is status post the following treatment: 1) status post neoadjuvant systemic chemotherapy with carboplatin , paclitaxel  and nivolumab  for 3 cycles in [place]  followed by right upper lobectomy with lymph node dissection at [facility] under the care of Dr. Valli with the final pathology showing residual disease with synchronous adenocarcinoma as well as squamous cell carcinoma in the right upper lobe  and multiple mediastinal lymph node involvement performed on November 13, 2021. 2)  evidence for disease recurrence with involvement of 4R lymphadenopathy consistent with metastatic adenocarcinoma on February 26, 2022 biopsy. Molecular studies showed no actionable mutations and PD-L1 expression was 15% in the adenocarcinoma in 2% in the squamous cell carcinoma. 3) Concurrent chemotherapy with carboplatin  for AUC of 5 and Alimta  500 Mg/M2 every 3 weeks for 2 cycles during the course of radiotherapy.  Last dose was giving 04/08/2022. He tolerated his treatment well except for the chemotherapy-induced odynophagia in addition to fatigue and sore throat. He started treatment  with consolidation immunotherapy with Imfinzi  1500 Mg IV every 4 weeks status post 1 cycle.  This was discontinued secondary to intolerance and severe diarrhea managed by his gastroenterologist Dr. Aneita. He underwent palliative radiotherapy with the disease recurrence in the right paratracheal lymph node under the care of Dr. Shannon in March 2025. The patient is currently on observation. His recent CT scan of the chest showed enlargement of the right paratracheal lymph node again concerning for disease progression with concern for bone invasion in the area and the patient is symptomatic and has right arm and shoulder with neuropathy. His MRI of the cervical and thoracic spine showed is 3.9 x 3.1 x 4.4 cm right paraspinal mass at the T1-T2 level with extension into the right T1-T2 neural foreman and involvement of the brachial plexus.  He had MRI of the brain that showed no evidence of metastatic disease to the brain.  He also had a PET scan performed recently and that showed the high right paratracheal/paraspinal nodal mass has decreased metabolism but increased in size indicative of disease progression corresponding to the thoracic spine MR that showed extension into the right T1-T2 neural foramen.  There was some patchy hypermetabolism in the spine and left sacrum with no CT correlate. Assessment and Plan Assessment & Plan Recurrent and progressive non-small cell lung cancer of the right lung Locally advanced, symptomatic, and progressive right paratracheal mass with increased size and decreased metabolic activity on imaging. The mass invades nerve roots, causing significant morbidity. No evidence of brain metastases. Surgical resection is not feasible due to location and prior treatments. Local control is preferred; systemic therapy may be required if local options are exhausted. - Contacted radiation oncology (Dr. Shannon) to evaluate for additional radiation therapy to the right paratracheal mass as  the preferred local treatment. - Discussed that if radiation is not feasible, systemic chemotherapy and immunotherapy may be initiated, with a slower expected response. - Discussed the possibility of future chemotherapy to prevent further progression, depending on response to local therapy. - Encouraged consideration of a second opinion at [facility] and offered to coordinate with Dr. Chyrl Gaskins for further input or novel therapies. - Planned follow-up in 4-6 weeks to reassess treatment response and disease status.  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 Pain control remains suboptimal despite gabapentin  and oxycodone , significantly impacting quality of life. - Referred to palliative care clinic for advanced pain and neuropathy management, including possible medication adjustments. - Reviewed current regimen (gabapentin  300 mg TID and oxycodone  as needed) and discussed possible titration of gabapentin  with neurology if indicated. - Coordinated with palliative care nurse practitioner for further evaluation and management.  Recurrent pneumonia Recurrent episodes of pneumonia with new areas of consolidation in the left upper  lobe and bilateral lower lobes on imaging, suggestive of infection or inflammation. Multiple prior episodes have responded temporarily to antibiotics. Blood work remains within normal limits. - Prescribed doxycycline  for 1 week for current symptoms. - Provided anticipatory guidance regarding infection prevention, including changing toothbrush every 2-3 months and avoiding sick contacts.  Possible bone metastases Recent PET scan demonstrated increased activity in the spine and left sacrum without a corresponding mass on CT. Findings are indeterminate and may represent metastatic  disease, post-treatment changes, or benign etiologies such as arthritis. Chronic back pain is present. - Planned continued surveillance with imaging to monitor for development of a mass or progression in areas of increased activity. The patient was advised to call immediately if he has any concerning symptoms in the interval.   The patient voices understanding of current disease status and treatment options and is in agreement with the current care plan.  All questions were answered. The patient knows to call the clinic with any problems, questions or concerns. We can certainly see the patient much sooner if necessary.  The total time spent in the appointment was 55 minutes including review of chart and various tests results, discussions about plan of care and coordination of care plan .   Disclaimer: This note was dictated with voice recognition software. Similar sounding words can inadvertently be transcribed and may not be corrected upon review.        "

## 2024-01-09 ENCOUNTER — Other Ambulatory Visit: Payer: Self-pay | Admitting: Family

## 2024-01-09 DIAGNOSIS — Z129 Encounter for screening for malignant neoplasm, site unspecified: Secondary | ICD-10-CM

## 2024-01-09 NOTE — Progress Notes (Signed)
 Location of tumor and Histology per Pathology Report: Right Lung  Biopsy: no  Past/Anticipated interventions by surgeon, if any:  Past/Anticipated interventions by medical oncology, if any:     Pain issues, if any:  yes constant, patient reports having to right arm, right back and right chest. Reports skin feels like its on fire.   SAFETY ISSUES: Prior radiation? Yes 03/19/22-05/06/22, 021/18/25-03/10/25 Pacemaker/ICD? no Possible current pregnancy? no Is the patient on methotrexate? no  Current Complaints / other details:    BP 122/73   Pulse 85   Temp (!) 97 F (36.1 C)   Resp 18   Wt 195 lb (88.5 kg)   BMI 26.82 kg/m

## 2024-01-09 NOTE — Telephone Encounter (Signed)
 Pt called in to check status of referral request. He currently has an appt on 01/06 and is worried that if he has to reschedule, it may be another long wait time before he can be seen. Please call pt to update/advise of next steps.

## 2024-01-11 NOTE — Progress Notes (Signed)
 " Radiation Oncology         [phone] ________________________________  Outpatient Re-Consultation  Name: Patrick Harris MRN: [id number]  Date: 01/12/2024  DOB: 07-01-1945  RR:Almwd, Glade PARAS, MD  Sherrod Sherrod, MD   REFERRING PHYSICIAN: Sherrod Sherrod, MD  DIAGNOSIS: There were no encounter diagnoses.  Stage IIIA (T1c, N 2, M0) adenocarcinoma in the right upper lobe as well as stage IA (T1c, N0, M0) squamous cell carcinoma involving the right upper lobe and stage IA (T1b, N0, M0) adenocarcinoma involving the left upper lobe diagnosed in June 2023. Developed an enlarged hypermetabolic high right paratracheal node in early 2025; s/p radiation to the right paratracheal lymph node completed on 03/17/2023. Now with a further increase in size of the right paratracheal lymph node with new impingement on the upper thoracic nerve roots in December 2025.   HISTORY OF PRESENT ILLNESS::Patrick Harris is a 79 y.o. male who is accompanied by ***. he returns to us  today as a courtesy of Dr. Sherrod for an opinion concerning further radiation therapy as part of management for an increasing high right paratracheal lymph node from a lung cancer primary.    The patient is known to us  for his history of bilateral lung cancers diagnosed in June of 2023, s/p chemotherapy and a right upper lobectomy w/ lymph node dissections. He developed recurrent disease in February of 2024 and received concurrent chemoradiation which was completed in April of 2024. He again developed recurrent disease in a high right paratracheal node in January of 2025 and received radiation therapy to the site. He was last seen here on 04/17/23 for a follow-up visit 1 month after completing radiation therapy to the right paratracheal node. Since then, he has continued to be followed under observation by Dr. Sherrod.  Shortly after our last visit, he presented for a restaging CT CAP w/ contrast on 04/29/23 that showed stable disease,  characterized by a slight decrease in size of the right thoracic inlet lymph node and no new areas of lymphadenopathy overall.   His next restaging CT CAP on 07/25/23 again showed a similar to slight decrease in size of the right thoracic inlet lymph nodes and no evidence of recurrent or metastatic disease.   However, his next (and most recent) restaging CT CAP on 12/10/23 unfortunately showed interval enlargement of the high right paratracheal nodal mass. Possible invasion of the adjacent right T2 neural foramen by the mass was also demonstrated, as well as a possible mild basilar interstitial lung abnormality.    Related to CT findings showing possible bony involvement, the patient developed significant right arm, back, and shoulder pain starting this past November. The patient reported persistent pain during a visit with Dr. Sherrod on 12/17/23. Pertaining to systemic treatment, Dr. Sherrod may consider chemotherapy with carbo and Alimta  if further radiation therapy to the site is not feasible.   Per his most recent visit with Dr. Sherrod on 01/07/24, the patient characterized his pain as burning, stinging, and deeply throbbing. Unfortunately, his pain remains minimally controlled despite his use of gabapentin  and oxycodone . Dr. Sherrod has subsequently referred him to the palliative care clinic for advanced pain and neuropathy management.  Pertinent/recent imaging performed thus far includes:   -- MRI of the thoracic spine on 12/24/23 that demonstrated a 3.9 x 3.1 by 4.4 cm right paraspinal mass at the T1-T2 level with extension into the right T1-T2 neural foramen with involvement of the brachial plexus. An MRI of the cervical spine was also  performed at the time of this study which demonstrated concurrent findings.  -- MRI of the brain on 12/122/25 showed no evidence of intracranial metastatic disease or acute intracranial findings overall.   -- Restaging PET scan on 12/29/23 demonstrated a  decrease in hypermetabolism but an increase in size of the high right paratracheal / paraspinal nodal mass, measuring 2.3 x 4.1 cm. Other findings of potential clinical significance included new areas of consolidation in the left upper and bilateral lower lobes likely reflecting infectious / inflammatory etiologies, and areas of patchy hypermetabolism in the spine and left sacrum (likely treatment related).  Of note: He had several recurrent episodes of chest congestion in December and received 4 rounds of antibiotics in total with only short term improvement achieved. The patient endorsed persistent chest congestion during his visit with Dr. Sherrod on 01/07/24. Dr. Sherrod accordingly prescribed him a 1 week course of doxycycline .   PREVIOUS RADIATION THERAPY: Yes   2) Intent: Curative Radiation Treatment Dates: First Treatment Date: 2023-02-25 -- Last Treatment Date: 2023-03-17 Site/Dose/Technique/Mode:  Plan Name: SCV_R Site: Sclav-RT Technique: IMRT Mode: Photon Dose Per Fraction: 2 Gy Prescribed Dose (Delivered / Prescribed): 30 Gy / 30 Gy Prescribed Fxs (Delivered / Prescribed): 15 / 15  1) Intent: Curative      Radiation treatment dates: 03/19/22 through 05/06/22 Site/dose: Chest - 60 Gy delivered in 30 Fx at 2 Gy/Fx Beams/energy: 6X, 10X Technique: 3D  PAST MEDICAL HISTORY:  Past Medical History:  Diagnosis Date   Arthritis    CAD (coronary artery disease)    CABG with LIMA to LAD and RIMA to RCA August 2000 - Dr. Kerrin   Cancer [facility])    basal cell   CHF (congestive heart failure) (HCC)    COPD (chronic obstructive pulmonary disease) (HCC)    Elevated glycated hemoglobin    Essential hypertension    GERD (gastroesophageal reflux disease)    Heart murmur    History of radiation therapy    Right lung(chest) 03/19/22-05/06/22- Dr. Lynwood Nasuti   History of radiation therapy    Right Lung- 02/25/23-03/17/23- Dr. Lynwood Nasuti   Hyperlipidemia    Hypopituitarism     Lung cancer (HCC)    Osteopenia    Dr Loetta   Pancreatitis    Peyronie disease    Shingles    Testosterone  deficiency    Dr Loetta    PAST SURGICAL HISTORY: Past Surgical History:  Procedure Laterality Date   Bone spur  2006   5th toe bilaterally   BRONCHIAL BIOPSY  07/02/2021   Procedure: BRONCHIAL BIOPSIES;  Surgeon: Shelah Lamar RAMAN, MD;  Location: [facility] ENDOSCOPY;  Service: Pulmonary;;   BRONCHIAL BRUSHINGS  07/02/2021   Procedure: BRONCHIAL BRUSHINGS;  Surgeon: Shelah Lamar RAMAN, MD;  Location: [facility] ENDOSCOPY;  Service: Pulmonary;;   BRONCHIAL NEEDLE ASPIRATION BIOPSY  07/02/2021   Procedure: BRONCHIAL NEEDLE ASPIRATION BIOPSIES;  Surgeon: Shelah Lamar RAMAN, MD;  Location: [facility] ENDOSCOPY;  Service: Pulmonary;;   COLONOSCOPY  2007   negative; Dr Jakie   CORONARY ARTERY BYPASS GRAFT  2001   2 vessels   FIDUCIAL MARKER PLACEMENT  07/02/2021   Procedure: FIDUCIAL MARKER PLACEMENT;  Surgeon: Shelah Lamar RAMAN, MD;  Location: [facility] ENDOSCOPY;  Service: Pulmonary;;   fracture heel  1967   Bil/ due to fall off ladder   Fractured calcaneus  1969   bilaterally w/ ankle fusion   HERNIA REPAIR  1984   right inguinal hernia   KNEE SURGERY Left    LOBECTOMY  Right 11/13/2021   MENISECTOMY  2007   R medial, Dr. Barbarann   PORTA CATH INSERTION     SHOULDER SURGERY  1994   left   UPPER GASTROINTESTINAL ENDOSCOPY  2007   GERD, Dr Jakie   VASECTOMY     VIDEO BRONCHOSCOPY WITH RADIAL ENDOBRONCHIAL ULTRASOUND  07/02/2021   Procedure: VIDEO BRONCHOSCOPY WITH RADIAL ENDOBRONCHIAL ULTRASOUND;  Surgeon: Shelah Lamar RAMAN, MD;  Location: [facility] ENDOSCOPY;  Service: Pulmonary;;    FAMILY HISTORY:  Family History  Problem Relation Age of Onset   Diabetes Mother    Stroke Mother 49   Hypertension Mother    Heart attack Father 93   Leukemia Sister    Parkinson's disease Sister    Stroke Sister    Stroke Sister    Stroke Sister    Atrial fibrillation Sister    Prostate cancer Brother    Aortic  aneurysm Brother        abdominal   Heart disease Brother    Atrial fibrillation Brother    Breast cancer Daughter    Cancer Maternal Uncle         X3:prostate , renal, bone    Colon cancer Paternal Uncle    Esophageal cancer Neg Hx    Rectal cancer Neg Hx    Stomach cancer Neg Hx     SOCIAL HISTORY: Social History[1]  ALLERGIES: Allergies[2]  MEDICATIONS:  Current Outpatient Medications  Medication Sig Dispense Refill   albuterol  (VENTOLIN  HFA) 108 (90 Base) MCG/ACT inhaler Inhale 1-2 puffs into the lungs every 6 (six) hours as needed for wheezing or shortness of breath. 6.7 g 0   amitriptyline  (ELAVIL ) 10 MG tablet TAKE 1 TABLET BY MOUTH EVERYDAY AT BEDTIME 30 tablet 5   aspirin  EC 81 MG tablet Take 1 tablet (81 mg total) by mouth at bedtime. Okay to restart this medication on 07/03/2021 30 tablet 11   azelastine  (ASTELIN ) 0.1 % nasal spray Place 1 spray into both nostrils 2 (two) times daily. Use in each nostril as directed 30 mL 0   benzonatate  (TESSALON ) 100 MG capsule Take 1 capsule (100 mg total) by mouth 3 (three) times daily as needed for cough. Do not take with alcohol or while operating or driving heavy machinery 21 capsule 0   cabergoline (DOSTINEX) 0.5 MG tablet Take 0.25 mg by mouth 2 (two) times a week.     cefUROXime  (CEFTIN ) 500 MG tablet Take 1 tablet (500 mg total) by mouth 2 (two) times daily with a meal. 14 tablet 0   Cholecalciferol (VITAMIN D3) 2000 UNITS TABS Take 2,000 Units by mouth in the morning and at bedtime.     Coenzyme Q10 (COQ-10) 100 MG CAPS Take 100 mg by mouth in the morning.     doxycycline  (VIBRA -TABS) 100 MG tablet Take 1 tablet (100 mg total) by mouth 2 (two) times daily. 14 tablet 0   finasteride (PROSCAR) 5 MG tablet Take 5 mg by mouth daily.     gabapentin  (NEURONTIN ) 100 MG capsule Take 1 capsule (100 mg total) by mouth 3 (three) times daily for 2 days, THEN 1 capsule (100 mg total) 2 (two) times daily for 2 days, THEN 1 capsule (100 mg  total) daily at 6 (six) AM for 2 days. 12 capsule 0   gabapentin  (NEURONTIN ) 300 MG capsule Take 1 capsule (300 mg total) by mouth 3 (three) times daily. 90 capsule 3   HYDROcodone  bit-homatropine (HYCODAN) 5-1.5 MG/5ML syrup Take 5 mLs by mouth every  8 (eight) hours as needed for cough. 120 mL 0   irbesartan  (AVAPRO ) 75 MG tablet Take 1 tablet (75 mg total) by mouth daily. 90 tablet 3   lidocaine  (XYLOCAINE ) 2 % solution Use as directed 10 mLs in the mouth or throat every 3 (three) hours as needed. 100 mL 0   nitroGLYCERIN  (NITROSTAT ) 0.4 MG SL tablet Place 1 tablet (0.4 mg total) under the tongue every 5 (five) minutes x 3 doses as needed for chest pain (if no relief after 2nd dose, proceed to ED or call 911). 25 tablet 3   Omega-3 Fatty Acids (OMEGA-3 FISH OIL PO) Take 1,000 mg by mouth in the morning.     omeprazole  (PRILOSEC ) 40 MG capsule Take 1 capsule (40 mg total) by mouth daily. 90 capsule 3   oseltamivir  (TAMIFLU ) 75 MG capsule Take 1 capsule (75 mg total) by mouth every 12 (twelve) hours. 10 capsule 0   oxyCODONE -acetaminophen  (PERCOCET) 10-325 MG tablet Take 1 tablet by mouth every 8 (eight) hours as needed for pain. 30 tablet 0   polyethylene glycol (MIRALAX / GLYCOLAX) 17 g packet Take 17 g by mouth as needed.     rosuvastatin  (CRESTOR ) 20 MG tablet Take 1 tablet (20 mg total) by mouth daily. 90 tablet 3   sucralfate  (CARAFATE ) 1 g tablet Take 1 tablet (1 g total) by mouth 4 (four) times daily -  with meals and at bedtime. Crush and dissolve in 10 mL warm water prior to swallowing, take 20 min before meals 120 tablet 0   tamsulosin  (FLOMAX ) 0.4 MG CAPS capsule Take 0.4 mg by mouth in the morning.     Testosterone  20.25 MG/ACT (1.62%) GEL Apply 2 Pump topically in the morning.     Tiotropium Bromide-Olodaterol (STIOLTO RESPIMAT ) 2.5-2.5 MCG/ACT AERS Inhale 2 puffs into the lungs daily. 4 g 5   vitamin C (ASCORBIC ACID) 500 MG tablet Take 500 mg by mouth in the morning.     Vitamin E  (VITAMIN E/D-ALPHA NATURAL) 268 MG (400 UNIT) CAPS Take 1 tablet by mouth daily.     No current facility-administered medications for this encounter.    REVIEW OF SYSTEMS:  A 10+ POINT REVIEW OF SYSTEMS WAS OBTAINED including neurology, dermatology, psychiatry, cardiac, respiratory, lymph, extremities, GI, GU, musculoskeletal, constitutional, reproductive, HEENT. ***   PHYSICAL EXAM:  vitals were not taken for this visit.   General: Alert and oriented, in no acute distress HEENT: Head is normocephalic. Extraocular movements are intact. Oropharynx is clear. Neck: Neck is supple, no palpable cervical or supraclavicular lymphadenopathy. Heart: Regular in rate and rhythm with no murmurs, rubs, or gallops. Chest: Clear to auscultation bilaterally, with no rhonchi, wheezes, or rales. Abdomen: Soft, nontender, nondistended, with no rigidity or guarding. Extremities: No cyanosis or edema. Lymphatics: see Neck Exam Skin: No concerning lesions. Musculoskeletal: symmetric strength and muscle tone throughout. Neurologic: Cranial nerves II through XII are grossly intact. No obvious focalities. Speech is fluent. Coordination is intact. Psychiatric: Judgment and insight are intact. Affect is appropriate. ***  ECOG = ***  0 - Asymptomatic (Fully active, able to carry on all predisease activities without restriction)  1 - Symptomatic but completely ambulatory (Restricted in physically strenuous activity but ambulatory and able to carry out work of a light or sedentary nature. For example, light housework, office work)  2 - Symptomatic, <50% in bed during the day (Ambulatory and capable of all self care but unable to carry out any work activities. Up and about more  than 50% of waking hours)  3 - Symptomatic, >50% in bed, but not bedbound (Capable of only limited self-care, confined to bed or chair 50% or more of waking hours)  4 - Bedbound (Completely disabled. Cannot carry on any self-care. Totally  confined to bed or chair)  5 - Death   Raylene MM, Creech RH, Tormey DC, et al. [phone]). Toxicity and response criteria of the [facility]. Am. DOROTHA Bridges. Oncol. 5 (6): 649-55  LABORATORY DATA:  Lab Results  Component Value Date   WBC 4.5 01/06/2024   HGB 14.9 01/06/2024   HCT 43.3 01/06/2024   MCV 92.9 01/06/2024   PLT 169 01/06/2024   NEUTROABS 3.0 01/06/2024   Lab Results  Component Value Date   NA 136 01/06/2024   K 4.5 01/06/2024   CL 102 01/06/2024   CO2 25 01/06/2024   GLUCOSE 99 01/06/2024   BUN 15 01/06/2024   CREATININE 1.18 01/06/2024   CALCIUM  9.2 01/06/2024      RADIOGRAPHY: NM PET Image Restage (PS) Skull Base to Thigh (F-18 FDG) Result Date: 01/07/2024 CLINICAL DATA:  Subsequent treatment strategy for non-small cell lung cancer. EXAM: NUCLEAR MEDICINE PET SKULL BASE TO THIGH TECHNIQUE: 10.2 mCi F-18 FDG was injected intravenously. Full-ring PET imaging was performed from the skull base to thigh after the radiotracer. CT data was obtained and used for attenuation correction and anatomic localization. Fasting blood glucose: 107 mg/dl COMPARISON:  CT chest abdomen pelvis 12/10/2023 and PET 01/30/2023. FINDINGS: Mediastinal blood pool activity: SUV max 2.4 Liver activity: SUV max NA NECK: Minimal asymmetric left oro pharyngeal hypermetabolism, SUV max 4.2, without a definitive CT correlate. No hypermetabolic lymph nodes. Incidental CT findings: Cerebral atrophy. CHEST: High right paratracheal/paraspinal nodal mass measures 2.3 x 4.1 cm, SUV max 11.1, previously 13.8. New areas of subpleural consolidation in the lower lobes (7/45 and 50, with associated hypermetabolism. Areas of hypermetabolism along the atria, without CT correlate. Incidental CT findings: Right IJ Port-A-Cath terminates in the high right atrium. Atherosclerotic calcification of the aorta, aortic valve and coronary arteries. Heart is at the upper limits of normal in size. No pericardial or  pleural effusion. Right upper lobectomy. Post radiation parenchymal retraction and volume loss in the medial right hemithorax. Centrilobular and paraseptal emphysema. ABDOMEN/PELVIS: No abnormal hypermetabolism. Incidental CT findings: Contrast excretion from the kidneys related to recent MR brain with contrast. SKELETON: Increasing focal hypermetabolism in the medial left iliac wing, SUV max 5.0, previously 3.5, without a CT correlate. Patchy hypermetabolism in the spine. Otherwise, no abnormal hypermetabolism. Incidental CT findings: Degenerative changes in the spine. IMPRESSION: 1. High right paratracheal/paraspinal nodal mass has decrease in hypermetabolism but has increased in size, indicative of disease progression. Corresponding thoracic spine MR shows extension into the right T1-T2 neural foramen. 2. Areas of consolidation in the left upper and bilateral lower lobes, new in the interval from 12/10/2023, suggesting an infectious/inflammatory etiology. Consider short-term follow-up CT chest without contrast in 3-4 weeks, in further evaluation, as clinically indicated. 3. Patchy hypermetabolism in the spine and left sacrum, likely treatment related. 4. Aortic atherosclerosis (ICD10-I70.0). Coronary artery calcification. 5.  Emphysema (ICD10-J43.9). Electronically Signed   By: Newell Eke M.D.   On: 01/07/2024 10:02   MR BRAIN W WO CONTRAST Result Date: 01/06/2024 EXAM: MRI BRAIN WITH AND WITHOUT CONTRAST 12/29/2023 03:41:13 PM TECHNIQUE: Multiplanar multisequence MRI of the head/brain was performed with and without the administration of intravenous contrast. CONTRAST: 9 mL of Gadobutrol . COMPARISON: MRI Head 01/26/2023. CLINICAL  HISTORY: Non-small cell lung cancer (NSCLC), staging. FINDINGS: BRAIN AND VENTRICLES: No acute infarct. No acute intracranial hemorrhage. No mass effect or midline shift. No hydrocephalus. The sella is unremarkable. Normal flow voids. No mass or abnormal enhancement. ORBITS: No  acute abnormality. SINUSES: No acute abnormality. BONES AND SOFT TISSUES: Normal bone marrow signal and enhancement. No acute soft tissue abnormality. IMPRESSION: 1. No acute intracranial abnormality. 2. No mass or abnormal enhancement. Electronically signed by: Franky Stanford MD 01/06/2024 10:05 PM EST RP Workstation: [id number]   MR CERVICAL SPINE W WO CONTRAST Addendum Date: 12/26/2023 ADDENDUM REPORT: 12/26/2023 12:26 ADDENDUM: The right paraspinal lesion at T1-T2 is more completely imaged on the thoracic spine MRI and measures 3.9 x 3.1 x 4.4 cm Electronically Signed   By: Nancyann Burns M.D.   On: 12/26/2023 12:26   Result Date: 12/26/2023 CLINICAL DATA:  Lung cancer, new onset right paresthesias and pain EXAM: MRI CERVICAL SPINE WITHOUT AND WITH CONTRAST TECHNIQUE: Multiplanar and multiecho pulse sequences of the cervical spine, to include the craniocervical junction and cervicothoracic junction, were obtained without and with intravenous contrast. CONTRAST:  9mL GADAVIST  GADOBUTROL  1 MMOL/ML IV SOLN COMPARISON:  March 08, 2020 FINDINGS: The craniocervical junction is normal. There is no significant bone marrow signal abnormality. The cervical spinal cord is normal. There is an incompletely imaged mass on the right side at the T1 and T2 levels. It appears to extend into the neural foramen on the right at T1-T2. There is involvement of the brachial plexus. C2-C3: Mild degenerative disc disease. Ankylosis of the right facet joint. No spinal stenosis or foraminal stenosis C3-C4: Moderate degenerative disc disease and facet arthropathy. No spinal stenosis or foraminal stenosis C4-C5: Desiccation of the disc with preservation of the disc height. There is moderate right and severe left facet arthropathy. No spinal stenosis or foraminal stenosis C5-C6: Moderate degenerative disc disease with mild disc bulge. No significant facet disease. No spinal stenosis or foraminal stenosis C6-C7: Moderate degenerative disc  disease. No significant facet disease. No spinal stenosis or foraminal stenosis C7-T1: Moderate degenerative disc disease. No significant facet disease. No spinal stenosis or foraminal stenosis IMPRESSION: Incompletely imaged approximately 3.3 x 4.5 x 1.7 cm right paraspinal mass at T1-T2 that extends into the neural foramen on the right at T1-T2 and involves the brachial plexus. Multilevel degenerative changes without spinal stenosis or disc herniation Electronically Signed: By: Nancyann Burns M.D. On: 12/26/2023 12:20   MR THORACIC SPINE W WO CONTRAST Result Date: 12/26/2023 CLINICAL DATA:  Lung cancer with right paresthesias and pain EXAM: MRI THORACIC WITHOUT AND WITH CONTRAST TECHNIQUE: Multiplanar and multiecho pulse sequences of the thoracic spine were obtained without and with intravenous contrast. CONTRAST:  9mL GADAVIST  GADOBUTROL  1 MMOL/ML IV SOLN COMPARISON:  None Available. FINDINGS: Alignment: Normal Bone marrow signal: No significant abnormality Thoracic spinal cord: Normal Facet joints: No significant abnormality Intervertebral discs: No significant abnormality Paraspinal tissues: There is a 3.9 x 3.1 by 4.4 cm right paraspinal mass at the T1-T2 level with extension into the right T1-T2 neural foramen and involvement of the brachial plexus. IMPRESSION: There is a 3.9 x 3.1 by 4.4 cm right paraspinal mass at the T1-T2 level with extension into the right T1-T2 neural foramen and involvement of the brachial plexus. Electronically Signed   By: Nancyann Burns M.D.   On: 12/26/2023 12:24   DG Chest 2 View Result Date: 12/22/2023 CLINICAL DATA:  Cough and wheezing. EXAM: DG CHEST 2V COMPARISON:  12/09/2023 and CT chest 12/10/2023.  FINDINGS: Right IJ Port-A-Cath tip is in the SVC. Rightward shift of the mediastinum secondary to right upper lobectomy and post treatment scarring. Overall volume loss in the right hemithorax with elevation of the right hemidiaphragm. Left lung is clear. Left apical pleural  thickening. No pleural fluid. IMPRESSION: Right upper lobectomy.  No acute findings. Electronically Signed   By: Newell Eke M.D.   On: 12/22/2023 11:41      IMPRESSION: Stage IIIA (T1c, N 2, M0) adenocarcinoma in the right upper lobe as well as stage IA (T1c, N0, M0) squamous cell carcinoma involving the right upper lobe and stage IA (T1b, N0, M0) adenocarcinoma involving the left upper lobe diagnosed in June 2023. Developed an enlarged hypermetabolic high right paratracheal node in early 2025; s/p radiation to the right paratracheal lymph node completed on 03/17/2023. Now with a further increase in size of the right paratracheal lymph node with new impingement on the upper thoracic nerve roots in December 2025.   ***  Today, I talked to the patient and family about the findings and work-up thus far.  We discussed the natural history of *** and general treatment, highlighting the role of radiotherapy in the management.  We discussed the available radiation techniques, and focused on the details of logistics and delivery.  We reviewed the anticipated acute and late sequelae associated with radiation in this setting.  The patient was encouraged to ask questions that I answered to the best of my ability. *** A patient consent form was discussed and signed.  We retained a copy for our records.  The patient would like to proceed with radiation and will be scheduled for CT simulation.  PLAN: ***    *** minutes of total time was spent for this patient encounter, including preparation, face-to-face counseling with the patient and coordination of care, physical exam, and documentation of the encounter.   ------------------------------------------------  Lynwood CHARM Nasuti, PhD, MD  This document serves as a record of services personally performed by Lynwood Nasuti, MD. It was created on his behalf by Dorthy Fuse, a trained medical scribe. The creation of this record is based on the scribe's personal  observations and the provider's statements to them. This document has been checked and approved by the attending provider.      [1]  Social History Tobacco Use   Smoking status: Former    Current packs/day: 0.00    Average packs/day: 0.3 packs/day    Types: Cigarettes    Quit date: 05/2021    Years since quitting: 2.6    Passive exposure: Past   Smokeless tobacco: Never   Tobacco comments:    Smoked about 40 years, 1ppd  Vaping Use   Vaping status: Never Used  Substance Use Topics   Alcohol use: No   Drug use: No  [2]  Allergies Allergen Reactions   Latex Dermatitis    Contact  dermatitis   Bromocriptine Nausea Only    Dry heaves   Zestril  [Lisinopril ] Other (See Comments)    Leg pain, inc HR, dizziness, headache   Diovan  [Valsartan ] Nausea Only   Hydrochlorothiazide  Other (See Comments)    Possibly caused pancreatitis 2018   Norvasc  [Amlodipine ] Other (See Comments)    Edema    Zocor [Simvastatin]     Joint pain   "

## 2024-01-12 ENCOUNTER — Ambulatory Visit
Admission: RE | Admit: 2024-01-12 | Discharge: 2024-01-12 | Disposition: A | Discharge status: Home / Self Care | Source: Ambulatory Visit | Attending: Radiation Oncology | Admitting: Radiation Oncology

## 2024-01-12 ENCOUNTER — Other Ambulatory Visit: Payer: Self-pay | Admitting: Radiation Therapy

## 2024-01-12 ENCOUNTER — Encounter: Payer: Self-pay | Admitting: Radiation Oncology

## 2024-01-12 VITALS — BP 122/63 | HR 85 | Temp 97.0°F | Resp 18 | Wt 195.0 lb

## 2024-01-12 VITALS — BP 122/73 | HR 85 | Temp 97.0°F | Resp 18 | Wt 195.0 lb

## 2024-01-12 DIAGNOSIS — K219 Gastro-esophageal reflux disease without esophagitis: Secondary | ICD-10-CM | POA: Diagnosis not present

## 2024-01-12 DIAGNOSIS — I509 Heart failure, unspecified: Secondary | ICD-10-CM | POA: Diagnosis not present

## 2024-01-12 DIAGNOSIS — Z85828 Personal history of other malignant neoplasm of skin: Secondary | ICD-10-CM | POA: Diagnosis not present

## 2024-01-12 DIAGNOSIS — Z800 Family history of malignant neoplasm of digestive organs: Secondary | ICD-10-CM | POA: Insufficient documentation

## 2024-01-12 DIAGNOSIS — I700 Atherosclerosis of aorta: Secondary | ICD-10-CM | POA: Insufficient documentation

## 2024-01-12 DIAGNOSIS — E230 Hypopituitarism: Secondary | ICD-10-CM | POA: Diagnosis not present

## 2024-01-12 DIAGNOSIS — M129 Arthropathy, unspecified: Secondary | ICD-10-CM | POA: Insufficient documentation

## 2024-01-12 DIAGNOSIS — M8580 Other specified disorders of bone density and structure, unspecified site: Secondary | ICD-10-CM | POA: Diagnosis not present

## 2024-01-12 DIAGNOSIS — Z803 Family history of malignant neoplasm of breast: Secondary | ICD-10-CM | POA: Insufficient documentation

## 2024-01-12 DIAGNOSIS — G893 Neoplasm related pain (acute) (chronic): Secondary | ICD-10-CM | POA: Diagnosis not present

## 2024-01-12 DIAGNOSIS — C3411 Malignant neoplasm of upper lobe, right bronchus or lung: Secondary | ICD-10-CM | POA: Diagnosis present

## 2024-01-12 DIAGNOSIS — Z79899 Other long term (current) drug therapy: Secondary | ICD-10-CM | POA: Diagnosis not present

## 2024-01-12 DIAGNOSIS — E785 Hyperlipidemia, unspecified: Secondary | ICD-10-CM | POA: Insufficient documentation

## 2024-01-12 DIAGNOSIS — C770 Secondary and unspecified malignant neoplasm of lymph nodes of head, face and neck: Secondary | ICD-10-CM | POA: Diagnosis not present

## 2024-01-12 DIAGNOSIS — Z923 Personal history of irradiation: Secondary | ICD-10-CM | POA: Insufficient documentation

## 2024-01-12 DIAGNOSIS — M546 Pain in thoracic spine: Secondary | ICD-10-CM | POA: Insufficient documentation

## 2024-01-12 DIAGNOSIS — J449 Chronic obstructive pulmonary disease, unspecified: Secondary | ICD-10-CM | POA: Diagnosis not present

## 2024-01-12 DIAGNOSIS — M5031 Other cervical disc degeneration,  high cervical region: Secondary | ICD-10-CM | POA: Insufficient documentation

## 2024-01-12 DIAGNOSIS — I10 Essential (primary) hypertension: Secondary | ICD-10-CM | POA: Diagnosis not present

## 2024-01-12 DIAGNOSIS — Z8042 Family history of malignant neoplasm of prostate: Secondary | ICD-10-CM | POA: Insufficient documentation

## 2024-01-12 DIAGNOSIS — C3491 Malignant neoplasm of unspecified part of right bronchus or lung: Secondary | ICD-10-CM

## 2024-01-12 DIAGNOSIS — J432 Centrilobular emphysema: Secondary | ICD-10-CM | POA: Diagnosis not present

## 2024-01-12 DIAGNOSIS — I2510 Atherosclerotic heart disease of native coronary artery without angina pectoris: Secondary | ICD-10-CM | POA: Insufficient documentation

## 2024-01-13 ENCOUNTER — Telehealth: Payer: Self-pay | Admitting: Medical Oncology

## 2024-01-13 NOTE — Telephone Encounter (Addendum)
 [facility] f/u - Pt reported that Dr. Gretta recommended radiation and immunotherapy . Pt said Dr. Shannon told him that  he could not get xrt and said  Well I guess I'm gonna die. Next appt 04/20. I told him I will send message to Dr. Sherrod.

## 2024-01-14 NOTE — Progress Notes (Signed)
 Called pt and unable to receive additional XRT. Discussed chemo/IO as next line.  Dr. Gatha plans to see him next week to discuss as well.

## 2024-01-19 ENCOUNTER — Inpatient Hospital Stay: Attending: Internal Medicine

## 2024-01-19 DIAGNOSIS — T50995A Adverse effect of other drugs, medicaments and biological substances, initial encounter: Secondary | ICD-10-CM | POA: Insufficient documentation

## 2024-01-19 DIAGNOSIS — C3411 Malignant neoplasm of upper lobe, right bronchus or lung: Secondary | ICD-10-CM | POA: Insufficient documentation

## 2024-01-19 DIAGNOSIS — Z923 Personal history of irradiation: Secondary | ICD-10-CM | POA: Insufficient documentation

## 2024-01-19 DIAGNOSIS — Z79620 Long term (current) use of immunosuppressive biologic: Secondary | ICD-10-CM | POA: Insufficient documentation

## 2024-01-19 DIAGNOSIS — G620 Drug-induced polyneuropathy: Secondary | ICD-10-CM | POA: Insufficient documentation

## 2024-01-19 DIAGNOSIS — Z5111 Encounter for antineoplastic chemotherapy: Secondary | ICD-10-CM | POA: Insufficient documentation

## 2024-01-19 DIAGNOSIS — T451X5A Adverse effect of antineoplastic and immunosuppressive drugs, initial encounter: Secondary | ICD-10-CM | POA: Insufficient documentation

## 2024-01-19 DIAGNOSIS — G893 Neoplasm related pain (acute) (chronic): Secondary | ICD-10-CM | POA: Insufficient documentation

## 2024-01-19 DIAGNOSIS — Z5112 Encounter for antineoplastic immunotherapy: Secondary | ICD-10-CM | POA: Insufficient documentation

## 2024-01-20 ENCOUNTER — Telehealth: Payer: Self-pay | Admitting: Genetic Counselor

## 2024-01-20 NOTE — Telephone Encounter (Signed)
 I messaged Dr. Sherrod about this patient who was seen on the brain tumor board.  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  I recommended genetic testing for this patient.  Darice Monte, MS, CGC  Licensed, Patent Attorney [email or web address] phone: [phone]

## 2024-01-22 ENCOUNTER — Inpatient Hospital Stay

## 2024-01-22 ENCOUNTER — Other Ambulatory Visit: Payer: Self-pay

## 2024-01-22 ENCOUNTER — Other Ambulatory Visit: Payer: Self-pay | Admitting: Internal Medicine

## 2024-01-22 ENCOUNTER — Telehealth: Payer: Self-pay

## 2024-01-22 ENCOUNTER — Inpatient Hospital Stay: Admitting: Internal Medicine

## 2024-01-22 VITALS — BP 136/76 | HR 92 | Temp 97.8°F | Resp 17 | Ht 71.5 in | Wt 200.4 lb

## 2024-01-22 DIAGNOSIS — C3491 Malignant neoplasm of unspecified part of right bronchus or lung: Secondary | ICD-10-CM

## 2024-01-22 DIAGNOSIS — G893 Neoplasm related pain (acute) (chronic): Secondary | ICD-10-CM | POA: Diagnosis not present

## 2024-01-22 DIAGNOSIS — Z5112 Encounter for antineoplastic immunotherapy: Secondary | ICD-10-CM | POA: Diagnosis not present

## 2024-01-22 DIAGNOSIS — C3411 Malignant neoplasm of upper lobe, right bronchus or lung: Secondary | ICD-10-CM | POA: Diagnosis not present

## 2024-01-22 DIAGNOSIS — Z79620 Long term (current) use of immunosuppressive biologic: Secondary | ICD-10-CM | POA: Diagnosis not present

## 2024-01-22 DIAGNOSIS — T50995A Adverse effect of other drugs, medicaments and biological substances, initial encounter: Secondary | ICD-10-CM | POA: Diagnosis not present

## 2024-01-22 DIAGNOSIS — G620 Drug-induced polyneuropathy: Secondary | ICD-10-CM | POA: Diagnosis not present

## 2024-01-22 DIAGNOSIS — T451X5A Adverse effect of antineoplastic and immunosuppressive drugs, initial encounter: Secondary | ICD-10-CM | POA: Diagnosis not present

## 2024-01-22 DIAGNOSIS — Z5111 Encounter for antineoplastic chemotherapy: Secondary | ICD-10-CM | POA: Diagnosis present

## 2024-01-22 DIAGNOSIS — Z923 Personal history of irradiation: Secondary | ICD-10-CM | POA: Diagnosis not present

## 2024-01-22 MED ORDER — ONDANSETRON HCL 8 MG PO TABS: 8.0000 mg | ORAL_TABLET | Freq: Three times a day (TID) | ORAL | 1 refills | Status: DC | PRN

## 2024-01-22 MED ORDER — PROCHLORPERAZINE MALEATE 10 MG PO TABS: 10.0000 mg | ORAL_TABLET | Freq: Four times a day (QID) | ORAL | 1 refills | Status: DC | PRN

## 2024-01-22 MED ORDER — CYANOCOBALAMIN 1000 MCG/ML IJ SOLN
1000.0000 ug | Freq: Once | INTRAMUSCULAR | Status: AC
  Administered 2024-01-22: 1000 ug via INTRAMUSCULAR
  Filled 2024-01-22: qty 1

## 2024-01-22 MED ORDER — LIDOCAINE-PRILOCAINE 2.5-2.5 % EX CREA: TOPICAL_CREAM | CUTANEOUS | 3 refills | Status: DC

## 2024-01-22 MED ORDER — OXYCODONE-ACETAMINOPHEN 10-325 MG PO TABS: 1.0000 | ORAL_TABLET | Freq: Three times a day (TID) | ORAL | 0 refills | Status: DC | PRN

## 2024-01-22 NOTE — Progress Notes (Signed)
 DISCONTINUE ON PATHWAY REGIMEN - Non-Small Cell Lung     A cycle is every 28 days:     Durvalumab    **Always confirm dose/schedule in your pharmacy ordering system**  PRIOR TREATMENT: [id number]: Durvalumab  1,500 mg q28 Days x up to 12 Months  START ON PATHWAY REGIMEN - Non-Small Cell Lung     A cycle is every 21 days:     Pembrolizumab      Paclitaxel       Carboplatin    **Always confirm dose/schedule in your pharmacy ordering system**  Patient Characteristics: Stage IV Metastatic, Squamous, Molecular Analysis Completed, Molecular Alteration Present and Targeted Therapy Exhausted, OR EGFR/KRAS/HER2/NRG1 Present and Standard Chemotherapy/Immunotherapy Planned, OR No Alteration Present, PS = 0, 1, Initial  Chemotherapy/Immunotherapy, No Alteration Present, Immunotherapy Candidate, PD-L1 Expression Positive 1-49% (TPS) / Negative / Not Tested / Awaiting Test Results Therapeutic Status: Stage IV Metastatic Histology: Squamous Cell Molecular Analysis Results: No Alteration Present Chemotherapy/Immunotherapy Line of Therapy: Initial Chemotherapy/Immunotherapy ECOG Performance Status: 1 Immunotherapy Candidate Status: Candidate for Immunotherapy PD-L1 Expression Status: PD-L1 Positive 1-49% (TPS) Intent of Therapy: Non-Curative / Palliative Intent, Discussed with Patient

## 2024-01-22 NOTE — Telephone Encounter (Signed)
 Spoke with patient in regards to medication refill on Percocet.  Advised patient I would relay information to Dr. Sherrod for review.  He voiced understanding.

## 2024-01-22 NOTE — Progress Notes (Signed)
 "     [facility] Telephone:([phone]   Fax:([phone]) [phone]  OFFICE PROGRESS NOTE  Geofm Glade PARAS, MD [address] [place] [address]  DIAGNOSIS: Recurrent and progressive non-small cell lung cancer, adenocarcinoma initially diagnosed as stage IIIA (T1c, N 2, M0) adenocarcinoma in the right upper lobe as well as stage IA (T1c, N0, M0) squamous cell carcinoma involving the right upper lobe and stage IA (T1b, N0, M0) adenocarcinoma involving the left upper lobe diagnosed in June 2023.  He has evidence for disease progression with enlarging right paratracheal lymph node with impingement on the upper thoracic nerve roots in December 2025.  PRIOR THERAPY:  1) status post neoadjuvant systemic chemotherapy with carboplatin , paclitaxel  and nivolumab  for 3 cycles in [place]  followed by right upper lobectomy with lymph node dissection at [facility] under the care of Dr. Valli with the final pathology showing residual disease with synchronous adenocarcinoma as well as squamous cell carcinoma in the right upper lobe and multiple mediastinal lymph node involvement performed on November 13, 2021. 2)  evidence for disease recurrence with involvement of 4R lymphadenopathy consistent with metastatic adenocarcinoma on February 26, 2022 biopsy. Molecular studies showed no actionable mutations and PD-L1 expression was 15% in the adenocarcinoma in 2% in the squamous cell carcinoma. 3) Concurrent chemotherapy with carboplatin  for AUC of 5 and Alimta  500 Mg/M2 every 3 weeks for 2 cycles during the course of radiotherapy.  Last dose was giving 04/08/2022. 4) Consolidation treatment with immunotherapy with Imfinzi  1500 Mg IV every 4 weeks.  First dose May 29, 2022 status post 1 cycle.  This treatment was discontinued secondary to intolerance 5) palliative radiotherapy to the recurrent disease right paratracheal lymph node under the care of Dr. Shannon completed March 17, 2023.  CURRENT THERAPY: First-line systemic chemoimmunotherapy with carboplatin  for AUC of 5, Alimta  500 mg/M2 and Keytruda 200 mg IV every 3 weeks.  Status post January 29, 2023.  INTERVAL HISTORY: Patrick Harris 79 y.o. male returns to the clinic today for follow-up visit accompanied by his wife.  Discussed the use of AI scribe software for clinical note transcription with the patient, who gave verbal consent to proceed.  History of Present Illness Patrick Harris is a 79 year old male with recurrent, progressive stage IIIA non-small cell lung cancer who presents for oncology follow-up to address disease progression and cancer-related pain.  He was initially diagnosed with stage IIIA non-small cell lung cancer in June 2023. Disease progression was identified in December 2025, with an enlarging right paratracheal lymph node causing upper thoracic nerve root impingement. He has received neoadjuvant systemic chemotherapy with carboplatin , paclitaxel , and nivolumab , followed by concurrent chemoradiation, and a single dose of consolidation durvalumab , which was discontinued due to intolerance. Palliative radiation was administered to the right paratracheal lymph node in March 2025, but further radiation is not feasible due to prior exposure and tumor location. Surgical intervention was considered but deemed not possible by neurosurgery and thoracic surgery due to tumor location.  He describes persistent pain attributed to tumor progression, characterized by significant burning neuropathy in his fingernails and feet. He reports his fingernails feel just on fire and he never felt it when a vibrating device was applied to his foot. He experiences fatigue, particularly after injections. He is currently using oxycodone  as needed for pain control and has not yet seen palliative care for further pain management.  He has previously tolerated chemotherapy without significant complications and is aware of  the potential side effects of the planned regimen, including nausea, vomiting, fatigue, neuropathy, alopecia, and immunotherapy-related risks. He does not have an EGFR mutation and is scheduled to begin combination chemotherapy and immunotherapy with carboplatin , pemetrexed , and pembrolizumab, with plans for regular imaging and transition to maintenance immunotherapy if tolerated and effective.  Additional concerns include prior colon Inflammation, which is being monitored with regular imaging, and a previous hip issue, which was evaluated and found to be non-contributory.   MEDICAL HISTORY: Past Medical History:  Diagnosis Date   Arthritis    CAD (coronary artery disease)    CABG with LIMA to LAD and RIMA to RCA August 2000 - Dr. Kerrin   Cancer [facility])    basal cell   CHF (congestive heart failure) (HCC)    COPD (chronic obstructive pulmonary disease) (HCC)    Elevated glycated hemoglobin    Essential hypertension    GERD (gastroesophageal reflux disease)    Heart murmur    History of radiation therapy    Right lung(chest) 03/19/22-05/06/22- Dr. Lynwood Nasuti   History of radiation therapy    Right Lung- 02/25/23-03/17/23- Dr. Lynwood Nasuti   Hyperlipidemia    Hypopituitarism    Lung cancer [facility])    Osteopenia    Dr Loetta   Pancreatitis    Peyronie disease    Shingles    Testosterone  deficiency    Dr Loetta    ALLERGIES:  is allergic to latex, bromocriptine, zestril  [lisinopril ], diovan  [valsartan ], hydrochlorothiazide , norvasc  [amlodipine ], and zocor [simvastatin].  MEDICATIONS:  Current Outpatient Medications  Medication Sig Dispense Refill   albuterol  (VENTOLIN  HFA) 108 (90 Base) MCG/ACT inhaler Inhale 1-2 puffs into the lungs every 6 (six) hours as needed for wheezing or shortness of breath. 6.7 g 0   amitriptyline  (ELAVIL ) 10 MG tablet TAKE 1 TABLET BY MOUTH EVERYDAY AT BEDTIME 30 tablet 5   aspirin  EC 81 MG tablet Take 1 tablet (81 mg total) by mouth at bedtime. Okay  to restart this medication on 07/03/2021 30 tablet 11   azelastine  (ASTELIN ) 0.1 % nasal spray Place 1 spray into both nostrils 2 (two) times daily. Use in each nostril as directed 30 mL 0   benzonatate  (TESSALON ) 100 MG capsule Take 1 capsule (100 mg total) by mouth 3 (three) times daily as needed for cough. Do not take with alcohol or while operating or driving heavy machinery 21 capsule 0   cabergoline (DOSTINEX) 0.5 MG tablet Take 0.25 mg by mouth 2 (two) times a week.     cefUROXime  (CEFTIN ) 500 MG tablet Take 1 tablet (500 mg total) by mouth 2 (two) times daily with a meal. (Patient not taking: Reported on 01/12/2024) 14 tablet 0   Cholecalciferol (VITAMIN D3) 2000 UNITS TABS Take 2,000 Units by mouth in the morning and at bedtime.     Coenzyme Q10 (COQ-10) 100 MG CAPS Take 100 mg by mouth in the morning.     doxycycline  (VIBRA -TABS) 100 MG tablet Take 1 tablet (100 mg total) by mouth 2 (two) times daily. 14 tablet 0   finasteride (PROSCAR) 5 MG tablet Take 5 mg by mouth daily.     gabapentin  (NEURONTIN ) 100 MG capsule Take 1 capsule (100 mg total) by mouth 3 (three) times daily for 2 days, THEN 1 capsule (100 mg total) 2 (two) times daily for 2 days, THEN 1 capsule (100 mg total) daily at 6 (six) AM for 2 days. 12 capsule 0   gabapentin  (NEURONTIN ) 300 MG capsule Take 1 capsule (  300 mg total) by mouth 3 (three) times daily. 90 capsule 3   HYDROcodone  bit-homatropine (HYCODAN) 5-1.5 MG/5ML syrup Take 5 mLs by mouth every 8 (eight) hours as needed for cough. 120 mL 0   irbesartan  (AVAPRO ) 75 MG tablet Take 1 tablet (75 mg total) by mouth daily. 90 tablet 3   lidocaine  (XYLOCAINE ) 2 % solution Use as directed 10 mLs in the mouth or throat every 3 (three) hours as needed. 100 mL 0   nitroGLYCERIN  (NITROSTAT ) 0.4 MG SL tablet Place 1 tablet (0.4 mg total) under the tongue every 5 (five) minutes x 3 doses as needed for chest pain (if no relief after 2nd dose, proceed to ED or call 911). 25 tablet 3    Omega-3 Fatty Acids (OMEGA-3 FISH OIL PO) Take 1,000 mg by mouth in the morning.     omeprazole  (PRILOSEC ) 40 MG capsule Take 1 capsule (40 mg total) by mouth daily. 90 capsule 3   oseltamivir  (TAMIFLU ) 75 MG capsule Take 1 capsule (75 mg total) by mouth every 12 (twelve) hours. 10 capsule 0   oxyCODONE -acetaminophen  (PERCOCET) 10-325 MG tablet Take 1 tablet by mouth every 8 (eight) hours as needed for pain. 30 tablet 0   polyethylene glycol (MIRALAX / GLYCOLAX) 17 g packet Take 17 g by mouth as needed.     rosuvastatin  (CRESTOR ) 20 MG tablet Take 1 tablet (20 mg total) by mouth daily. 90 tablet 3   sucralfate  (CARAFATE ) 1 g tablet Take 1 tablet (1 g total) by mouth 4 (four) times daily -  with meals and at bedtime. Crush and dissolve in 10 mL warm water prior to swallowing, take 20 min before meals 120 tablet 0   tamsulosin  (FLOMAX ) 0.4 MG CAPS capsule Take 0.4 mg by mouth in the morning.     Testosterone  20.25 MG/ACT (1.62%) GEL Apply 2 Pump topically in the morning.     Tiotropium Bromide-Olodaterol (STIOLTO RESPIMAT ) 2.5-2.5 MCG/ACT AERS Inhale 2 puffs into the lungs daily. 4 g 5   vitamin C (ASCORBIC ACID) 500 MG tablet Take 500 mg by mouth in the morning.     Vitamin E (VITAMIN E/D-ALPHA NATURAL) 268 MG (400 UNIT) CAPS Take 1 tablet by mouth daily.     No current facility-administered medications for this visit.    SURGICAL HISTORY:  Past Surgical History:  Procedure Laterality Date   Bone spur  2006   5th toe bilaterally   BRONCHIAL BIOPSY  07/02/2021   Procedure: BRONCHIAL BIOPSIES;  Surgeon: Shelah Lamar RAMAN, MD;  Location: [facility] ENDOSCOPY;  Service: Pulmonary;;   BRONCHIAL BRUSHINGS  07/02/2021   Procedure: BRONCHIAL BRUSHINGS;  Surgeon: Shelah Lamar RAMAN, MD;  Location: [facility] ENDOSCOPY;  Service: Pulmonary;;   BRONCHIAL NEEDLE ASPIRATION BIOPSY  07/02/2021   Procedure: BRONCHIAL NEEDLE ASPIRATION BIOPSIES;  Surgeon: Shelah Lamar RAMAN, MD;  Location: [facility] ENDOSCOPY;  Service: Pulmonary;;    COLONOSCOPY  2007   negative; Dr Jakie   CORONARY ARTERY BYPASS GRAFT  2001   2 vessels   FIDUCIAL MARKER PLACEMENT  07/02/2021   Procedure: FIDUCIAL MARKER PLACEMENT;  Surgeon: Shelah Lamar RAMAN, MD;  Location: [facility] ENDOSCOPY;  Service: Pulmonary;;   fracture heel  1967   Bil/ due to fall off ladder   Fractured calcaneus  1969   bilaterally w/ ankle fusion   HERNIA REPAIR  1984   right inguinal hernia   KNEE SURGERY Left    LOBECTOMY Right 11/13/2021   MENISECTOMY  2007   R medial, Dr.  Barbarann SKEETER CATH INSERTION     SHOULDER SURGERY  1994   left   UPPER GASTROINTESTINAL ENDOSCOPY  2007   GERD, Dr Jakie   VASECTOMY     VIDEO BRONCHOSCOPY WITH RADIAL ENDOBRONCHIAL ULTRASOUND  07/02/2021   Procedure: VIDEO BRONCHOSCOPY WITH RADIAL ENDOBRONCHIAL ULTRASOUND;  Surgeon: Shelah Lamar RAMAN, MD;  Location: [facility] ENDOSCOPY;  Service: Pulmonary;;    REVIEW OF SYSTEMS:  Constitutional: positive for fatigue Eyes: negative Ears, nose, mouth, throat, and face: negative Respiratory: positive for pleurisy/chest pain Cardiovascular: negative Gastrointestinal: negative Genitourinary:negative Integument/breast: negative Hematologic/lymphatic: negative Musculoskeletal:positive for bone pain and neck pain Neurological: positive for paresthesia Behavioral/Psych: negative Endocrine: negative Allergic/Immunologic: negative   PHYSICAL EXAMINATION: General appearance: alert, cooperative, fatigued, and no distress Head: Normocephalic, without obvious abnormality, atraumatic Neck: no adenopathy, no JVD, supple, symmetrical, trachea midline, and thyroid  not enlarged, symmetric, no tenderness/mass/nodules Lymph nodes: Cervical, supraclavicular, and axillary nodes normal. Resp: clear to auscultation bilaterally Back: symmetric, no curvature. ROM normal. No CVA tenderness. Cardio: regular rate and rhythm, S1, S2 normal, no murmur, click, rub or gallop GI: soft, non-tender; bowel sounds normal; no  masses,  no organomegaly Extremities: extremities normal, atraumatic, no cyanosis or edema Neurologic: Alert and oriented X 3, normal strength and tone. Normal symmetric reflexes. Normal coordination and gait  ECOG PERFORMANCE STATUS: 1 - Symptomatic but completely ambulatory  Blood pressure 136/76, pulse 92, temperature 97.8 F (36.6 C), temperature source Temporal, resp. rate 17, height 5' 11.5 (1.816 m), weight 200 lb 6.4 oz (90.9 kg), SpO2 95%.  LABORATORY DATA: Lab Results  Component Value Date   WBC 4.5 01/06/2024   HGB 14.9 01/06/2024   HCT 43.3 01/06/2024   MCV 92.9 01/06/2024   PLT 169 01/06/2024      Chemistry      Component Value Date/Time   NA 136 01/06/2024 0948   K 4.5 01/06/2024 0948   CL 102 01/06/2024 0948   CO2 25 01/06/2024 0948   BUN 15 01/06/2024 0948   CREATININE 1.18 01/06/2024 0948   CREATININE 1.07 08/25/2019 0847      Component Value Date/Time   CALCIUM  9.2 01/06/2024 0948   ALKPHOS 65 01/06/2024 0948   AST 20 01/06/2024 0948   ALT 14 01/06/2024 0948   BILITOT 0.4 01/06/2024 0948       RADIOGRAPHIC STUDIES: NM PET Image Restage (PS) Skull Base to Thigh (F-18 FDG) Result Date: 01/07/2024 CLINICAL DATA:  Subsequent treatment strategy for non-small cell lung cancer. EXAM: NUCLEAR MEDICINE PET SKULL BASE TO THIGH TECHNIQUE: 10.2 mCi F-18 FDG was injected intravenously. Full-ring PET imaging was performed from the skull base to thigh after the radiotracer. CT data was obtained and used for attenuation correction and anatomic localization. Fasting blood glucose: 107 mg/dl COMPARISON:  CT chest abdomen pelvis 12/10/2023 and PET 01/30/2023. FINDINGS: Mediastinal blood pool activity: SUV max 2.4 Liver activity: SUV max NA NECK: Minimal asymmetric left oro pharyngeal hypermetabolism, SUV max 4.2, without a definitive CT correlate. No hypermetabolic lymph nodes. Incidental CT findings: Cerebral atrophy. CHEST: High right paratracheal/paraspinal nodal mass  measures 2.3 x 4.1 cm, SUV max 11.1, previously 13.8. New areas of subpleural consolidation in the lower lobes (7/45 and 50, with associated hypermetabolism. Areas of hypermetabolism along the atria, without CT correlate. Incidental CT findings: Right IJ Port-A-Cath terminates in the high right atrium. Atherosclerotic calcification of the aorta, aortic valve and coronary arteries. Heart is at the upper limits of normal in size. No pericardial or pleural effusion. Right upper  lobectomy. Post radiation parenchymal retraction and volume loss in the medial right hemithorax. Centrilobular and paraseptal emphysema. ABDOMEN/PELVIS: No abnormal hypermetabolism. Incidental CT findings: Contrast excretion from the kidneys related to recent MR brain with contrast. SKELETON: Increasing focal hypermetabolism in the medial left iliac wing, SUV max 5.0, previously 3.5, without a CT correlate. Patchy hypermetabolism in the spine. Otherwise, no abnormal hypermetabolism. Incidental CT findings: Degenerative changes in the spine. IMPRESSION: 1. High right paratracheal/paraspinal nodal mass has decrease in hypermetabolism but has increased in size, indicative of disease progression. Corresponding thoracic spine MR shows extension into the right T1-T2 neural foramen. 2. Areas of consolidation in the left upper and bilateral lower lobes, new in the interval from 12/10/2023, suggesting an infectious/inflammatory etiology. Consider short-term follow-up CT chest without contrast in 3-4 weeks, in further evaluation, as clinically indicated. 3. Patchy hypermetabolism in the spine and left sacrum, likely treatment related. 4. Aortic atherosclerosis (ICD10-I70.0). Coronary artery calcification. 5.  Emphysema (ICD10-J43.9). Electronically Signed   By: Newell Eke M.D.   On: 01/07/2024 10:02   MR BRAIN W WO CONTRAST Result Date: 01/06/2024 EXAM: MRI BRAIN WITH AND WITHOUT CONTRAST 12/29/2023 03:41:13 PM TECHNIQUE: Multiplanar  multisequence MRI of the head/brain was performed with and without the administration of intravenous contrast. CONTRAST: 9 mL of Gadobutrol . COMPARISON: MRI Head 01/26/2023. CLINICAL HISTORY: Non-small cell lung cancer (NSCLC), staging. FINDINGS: BRAIN AND VENTRICLES: No acute infarct. No acute intracranial hemorrhage. No mass effect or midline shift. No hydrocephalus. The sella is unremarkable. Normal flow voids. No mass or abnormal enhancement. ORBITS: No acute abnormality. SINUSES: No acute abnormality. BONES AND SOFT TISSUES: Normal bone marrow signal and enhancement. No acute soft tissue abnormality. IMPRESSION: 1. No acute intracranial abnormality. 2. No mass or abnormal enhancement. Electronically signed by: Franky Stanford MD 01/06/2024 10:05 PM EST RP Workstation: [id number]   MR CERVICAL SPINE W WO CONTRAST Addendum Date: 12/26/2023 ADDENDUM REPORT: 12/26/2023 12:26 ADDENDUM: The right paraspinal lesion at T1-T2 is more completely imaged on the thoracic spine MRI and measures 3.9 x 3.1 x 4.4 cm Electronically Signed   By: Nancyann Burns M.D.   On: 12/26/2023 12:26   Result Date: 12/26/2023 CLINICAL DATA:  Lung cancer, new onset right paresthesias and pain EXAM: MRI CERVICAL SPINE WITHOUT AND WITH CONTRAST TECHNIQUE: Multiplanar and multiecho pulse sequences of the cervical spine, to include the craniocervical junction and cervicothoracic junction, were obtained without and with intravenous contrast. CONTRAST:  9mL GADAVIST  GADOBUTROL  1 MMOL/ML IV SOLN COMPARISON:  March 08, 2020 FINDINGS: The craniocervical junction is normal. There is no significant bone marrow signal abnormality. The cervical spinal cord is normal. There is an incompletely imaged mass on the right side at the T1 and T2 levels. It appears to extend into the neural foramen on the right at T1-T2. There is involvement of the brachial plexus. C2-C3: Mild degenerative disc disease. Ankylosis of the right facet joint. No spinal stenosis or  foraminal stenosis C3-C4: Moderate degenerative disc disease and facet arthropathy. No spinal stenosis or foraminal stenosis C4-C5: Desiccation of the disc with preservation of the disc height. There is moderate right and severe left facet arthropathy. No spinal stenosis or foraminal stenosis C5-C6: Moderate degenerative disc disease with mild disc bulge. No significant facet disease. No spinal stenosis or foraminal stenosis C6-C7: Moderate degenerative disc disease. No significant facet disease. No spinal stenosis or foraminal stenosis C7-T1: Moderate degenerative disc disease. No significant facet disease. No spinal stenosis or foraminal stenosis IMPRESSION: Incompletely imaged approximately 3.3 x 4.5 x  1.7 cm right paraspinal mass at T1-T2 that extends into the neural foramen on the right at T1-T2 and involves the brachial plexus. Multilevel degenerative changes without spinal stenosis or disc herniation Electronically Signed: By: Nancyann Burns M.D. On: 12/26/2023 12:20   MR THORACIC SPINE W WO CONTRAST Result Date: 12/26/2023 CLINICAL DATA:  Lung cancer with right paresthesias and pain EXAM: MRI THORACIC WITHOUT AND WITH CONTRAST TECHNIQUE: Multiplanar and multiecho pulse sequences of the thoracic spine were obtained without and with intravenous contrast. CONTRAST:  9mL GADAVIST  GADOBUTROL  1 MMOL/ML IV SOLN COMPARISON:  None Available. FINDINGS: Alignment: Normal Bone marrow signal: No significant abnormality Thoracic spinal cord: Normal Facet joints: No significant abnormality Intervertebral discs: No significant abnormality Paraspinal tissues: There is a 3.9 x 3.1 by 4.4 cm right paraspinal mass at the T1-T2 level with extension into the right T1-T2 neural foramen and involvement of the brachial plexus. IMPRESSION: There is a 3.9 x 3.1 by 4.4 cm right paraspinal mass at the T1-T2 level with extension into the right T1-T2 neural foramen and involvement of the brachial plexus. Electronically Signed   By:  Nancyann Burns M.D.   On: 12/26/2023 12:24    ASSESSMENT AND PLAN: This is a very pleasant 79 years old white male with Stage IIIA (T1c, N 2, M0) adenocarcinoma in the right upper lobe as well as stage IA (T1c, N0, M0) squamous cell carcinoma involving the right upper lobe and stage IA (T1b, N0, M0) adenocarcinoma involving the left upper lobe diagnosed in June 2023. The patient is status post the following treatment: 1) status post neoadjuvant systemic chemotherapy with carboplatin , paclitaxel  and nivolumab  for 3 cycles in [place]  followed by right upper lobectomy with lymph node dissection at [facility] under the care of Dr. Valli with the final pathology showing residual disease with synchronous adenocarcinoma as well as squamous cell carcinoma in the right upper lobe and multiple mediastinal lymph node involvement performed on November 13, 2021. 2)  evidence for disease recurrence with involvement of 4R lymphadenopathy consistent with metastatic adenocarcinoma on February 26, 2022 biopsy. Molecular studies showed no actionable mutations and PD-L1 expression was 15% in the adenocarcinoma in 2% in the squamous cell carcinoma. 3) Concurrent chemotherapy with carboplatin  for AUC of 5 and Alimta  500 Mg/M2 every 3 weeks for 2 cycles during the course of radiotherapy.  Last dose was giving 04/08/2022. He tolerated his treatment well except for the chemotherapy-induced odynophagia in addition to fatigue and sore throat. He started treatment with consolidation immunotherapy with Imfinzi  1500 Mg IV every 4 weeks status post 1 cycle.  This was discontinued secondary to intolerance and severe diarrhea managed by his gastroenterologist Dr. Aneita. He underwent palliative radiotherapy with the disease recurrence in the right paratracheal lymph node under the care of Dr. Shannon in March 2025. The patient is currently on observation. His recent CT scan of the chest showed enlargement of the right  paratracheal lymph node again concerning for disease progression with concern for bone invasion in the area and the patient is symptomatic and has right arm and shoulder with neuropathy. His MRI of the cervical and thoracic spine showed is 3.9 x 3.1 x 4.4 cm right paraspinal mass at the T1-T2 level with extension into the right T1-T2 neural foreman and involvement of the brachial plexus.  He had MRI of the brain that showed no evidence of metastatic disease to the brain.  He also had a PET scan performed recently and that showed the high right  paratracheal/paraspinal nodal mass has decreased metabolism but increased in size indicative of disease progression corresponding to the thoracic spine MR that showed extension into the right T1-T2 neural foramen.  There was some patchy hypermetabolism in the spine and left sacrum with no CT correlate. The patient and his wife understand that he has incurable condition and all the treatment will be of palliative nature with the goal of prolongation of his survival and palliation of his symptoms. Assessment and Plan Assessment & Plan Recurrent and progressive stage IIIA non-small cell lung cancer, adenocarcinoma of the lung with right paratracheal nodal involvement Recurrent, progressive, and aggressive stage IIIA adenocarcinoma with right paratracheal nodal involvement causing nerve root impingement. Tumor is surgically inaccessible and not amenable to further radiation. Multidisciplinary review confirmed no surgical or additional radiation options. Prior therapies included neoadjuvant chemotherapy, concurrent chemoradiation, and palliative radiation. Planned regimen offers a 40-50% response rate. Targeted therapy and other chemotherapeutics are not appropriate due to molecular profile. - Initiate combination chemotherapy and immunotherapy with carboplatin , pemetrexed , and pembrolizumab every three weeks for four cycles (12 weeks total). - Transition to maintenance  pemetrexed  and pembrolizumab therapy every three weeks for up to two years if disease is stable and therapy is tolerated. - Discussed anticipated side effects: reversible alopecia, nausea, vomiting, fatigue, neuropathy, and immune-related adverse events (rash, diarrhea, organ inflammation). - Reviewed lack of benefit from targeted therapy (osimertinib) due to absence of EGFR mutation or SBRT for this tumor location. - Planned regular imaging to monitor disease response. - Documented drug regimen in after visit summary for reference.  Chemotherapy-induced peripheral neuropathy Pre-existing peripheral neuropathy with burning pain in fingers and feet, likely secondary to prior chemotherapy. Symptoms may worsen with planned regimen. - Advised cold therapy (ice water, cold packs) to hands and feet during chemotherapy to mitigate neuropathy. - Discussed potential for worsening neuropathy with planned chemotherapy.  Cancer-related pain Significant pain due to tumor impingement on nerve roots. Currently managed with as-needed oxycodone . Not yet under palliative care management. - Provided oxycodone  refill for pain control. - Planned referral to palliative care for ongoing pain management. The patient was advised to call immediately if he has any other concerning symptoms in the interval. The patient voices understanding of current disease status and treatment options and is in agreement with the current care plan.  All questions were answered. The patient knows to call the clinic with any problems, questions or concerns. We can certainly see the patient much sooner if necessary.  The total time spent in the appointment was 55 minutes including review of chart and various tests results, discussions about plan of care and coordination of care plan .   Disclaimer: This note was dictated with voice recognition software. Similar sounding words can inadvertently be transcribed and may not be corrected upon  review.        "

## 2024-01-22 NOTE — Progress Notes (Signed)
 DISCONTINUE ON PATHWAY REGIMEN - Non-Small Cell Lung     A cycle is every 21 days:     Pembrolizumab      Paclitaxel       Carboplatin    **Always confirm dose/schedule in your pharmacy ordering system**  PRIOR TREATMENT: [id number]: Pembrolizumab 200 mg + Carboplatin  AUC=6 + Paclitaxel  200 mg/m2 q21 Days x 4 Cycles  START ON PATHWAY REGIMEN - Non-Small Cell Lung     A cycle is every 21 days:     Pembrolizumab      Pemetrexed       Carboplatin    **Always confirm dose/schedule in your pharmacy ordering system**  Patient Characteristics: Stage IV Metastatic, Nonsquamous, Molecular Analysis Completed, Molecular Alteration Present and Targeted Therapy Exhausted, OR EGFR/KRAS/HER2/NRG1/c-Met Present and Standard Chemotherapy/Immunotherapy Planned, OR No Alteration Present, Initial  Chemotherapy/Immunotherapy, PS = 0, 1, No Alteration Present, No Alteration Present, Candidate for Immunotherapy, PD-L1 Expression Positive 1-49% (TPS) / Negative / Not Tested / Awaiting Test Results and Immunotherapy Candidate Therapeutic Status: Stage IV Metastatic Histology: Nonsquamous Cell Broad Molecular Profiling Status: Molecular Analysis Completed Molecular Analysis Results: No Alteration Present Chemotherapy/Immunotherapy Line of Therapy: Initial Chemotherapy/Immunotherapy ECOG Performance Status: 1 EGFR Exons 18-21 Mutation Testing Status: Completed and Negative BRAF V600 Mutation Testing Status: Completed and Negative KRAS G12C Mutation Testing Status: Completed and Negative MET Exon 14 Mutation Testing Status: Completed and Negative HER2 Mutation Testing Status: Completed and Negative c-Met Overexpression (EGFR Wildtype) Testing Status: Completed and Negative ALK Fusion/Rearrangement Testing Status: Completed and Negative RET Fusion/Rearrangement Testing Status: Completed and Negative NTRK Fusion/Rearrangement Testing Status: Completed and Negative ROS1 Fusion/Rearrangement Testing Status:  Completed and Negative NRG1 Fusion/Rearrangement Testing Status: Completed and Negative Immunotherapy Candidate Status: Candidate for Immunotherapy PD-L1 Expression Status: PD-L1 Positive 1-49% (TPS) Intent of Therapy: Non-Curative / Palliative Intent, Discussed with Patient

## 2024-01-23 ENCOUNTER — Other Ambulatory Visit: Payer: Self-pay

## 2024-01-23 ENCOUNTER — Encounter: Payer: Self-pay | Admitting: Internal Medicine

## 2024-01-26 ENCOUNTER — Telehealth: Payer: Self-pay | Admitting: Medical Oncology

## 2024-01-26 DIAGNOSIS — C3491 Malignant neoplasm of unspecified part of right bronchus or lung: Secondary | ICD-10-CM

## 2024-01-26 MED ORDER — PROCHLORPERAZINE MALEATE 10 MG PO TABS: 10.0000 mg | ORAL_TABLET | Freq: Four times a day (QID) | ORAL | 1 refills | Status: AC | PRN

## 2024-01-26 MED ORDER — ONDANSETRON HCL 8 MG PO TABS: 8.0000 mg | ORAL_TABLET | Freq: Three times a day (TID) | ORAL | 1 refills | Status: AC | PRN

## 2024-01-26 MED ORDER — DEXAMETHASONE 4 MG PO TABS: ORAL_TABLET | ORAL | 1 refills | Status: AC

## 2024-01-26 MED ORDER — FOLIC ACID 1 MG PO TABS: 1.0000 mg | ORAL_TABLET | Freq: Every day | ORAL | 3 refills | Status: AC

## 2024-01-26 NOTE — Telephone Encounter (Signed)
 He understood that Patrick Harris was giving him a new treatment and he would not have hair loss.He read about all the chemo side effects . I told him he may have hair thinning /loss with Carboplatin .  Does he have two types of NSCLC ? If so which one is being treated?  Home meds released from pre treatment day to preferred pharmacy.

## 2024-01-27 ENCOUNTER — Telehealth: Payer: Self-pay | Admitting: Nurse Practitioner

## 2024-01-27 MED FILL — Fosaprepitant Dimeglumine For IV Infusion 150 MG (Base Eq): INTRAVENOUS | Qty: 5 | Status: AC

## 2024-01-27 NOTE — Telephone Encounter (Signed)
 Pt notified that per Dr. Sherrod, he does have 2 types of non-small cell lung cancer. He has adenocarcinoma which is the current predominant subtype. He also has a history of a squamous cell carcinoma of the lung. He will be treated for adenocarcinoma of the lung with carboplatin , Alimta  and Keytruda . He did receive carboplatin  and Alimta  with radiation before.  He voiced understanding.

## 2024-01-27 NOTE — Telephone Encounter (Signed)
 PN/Ice -Patient asking if he can apply ice to his hands and feet tomorrow during Cisplatin. He has numbness and tingling in his hand and feet. He said he thinks it is from his previous chemotherapy and happened after his last dose.  I told him that Carboplaitn may cause PN and to tell his nurse tomorrow he wants to try the ice.

## 2024-01-28 ENCOUNTER — Inpatient Hospital Stay

## 2024-01-28 VITALS — BP 137/62 | HR 76 | Temp 97.8°F | Resp 18 | Wt 196.2 lb

## 2024-01-28 DIAGNOSIS — C3491 Malignant neoplasm of unspecified part of right bronchus or lung: Secondary | ICD-10-CM

## 2024-01-28 DIAGNOSIS — Z5111 Encounter for antineoplastic chemotherapy: Secondary | ICD-10-CM | POA: Diagnosis not present

## 2024-01-28 LAB — CMP (CANCER CENTER ONLY)
ALT: 15 U/L (ref 0–44)
AST: 23 U/L (ref 15–41)
Albumin: 4.1 g/dL (ref 3.5–5.0)
Alkaline Phosphatase: 65 U/L (ref 38–126)
Anion gap: 13 (ref 5–15)
BUN: 19 mg/dL (ref 8–23)
CO2: 23 mmol/L (ref 22–32)
Calcium: 9.4 mg/dL (ref 8.9–10.3)
Chloride: 100 mmol/L (ref 98–111)
Creatinine: 1.03 mg/dL (ref 0.61–1.24)
GFR, Estimated: >60 mL/min
Glucose, Bld: 150 mg/dL — ABNORMAL HIGH (ref 70–99)
Potassium: 4 mmol/L (ref 3.5–5.1)
Sodium: 136 mmol/L (ref 135–145)
Total Bilirubin: 0.4 mg/dL (ref 0.0–1.2)
Total Protein: 7.2 g/dL (ref 6.5–8.1)

## 2024-01-28 LAB — CBC WITH DIFFERENTIAL (CANCER CENTER ONLY)
Abs Immature Granulocytes: 0.04 K/uL (ref 0.00–0.07)
Basophils Absolute: 0 K/uL (ref 0.0–0.1)
Basophils Relative: 0 %
Eosinophils Absolute: 0 K/uL (ref 0.0–0.5)
Eosinophils Relative: 0 %
HCT: 40.5 % (ref 39.0–52.0)
Hemoglobin: 14 g/dL (ref 13.0–17.0)
Immature Granulocytes: 0 %
Lymphocytes Relative: 6 %
Lymphs Abs: 0.6 K/uL — ABNORMAL LOW (ref 0.7–4.0)
MCH: 32.2 pg (ref 26.0–34.0)
MCHC: 34.6 g/dL (ref 30.0–36.0)
MCV: 93.1 fL (ref 80.0–100.0)
Monocytes Absolute: 0.5 K/uL (ref 0.1–1.0)
Monocytes Relative: 5 %
Neutro Abs: 9.1 K/uL — ABNORMAL HIGH (ref 1.7–7.7)
Neutrophils Relative %: 89 %
Platelet Count: 153 K/uL (ref 150–400)
RBC: 4.35 MIL/uL (ref 4.22–5.81)
RDW: 13.3 % (ref 11.5–15.5)
WBC Count: 10.3 K/uL (ref 4.0–10.5)
nRBC: 0 % (ref 0.0–0.2)

## 2024-01-28 LAB — TSH: TSH: 1.05 u[IU]/mL (ref 0.350–4.500)

## 2024-01-28 LAB — T4, FREE: Free T4: 1.03 ng/dL (ref 0.80–2.00)

## 2024-01-28 MED ORDER — PALONOSETRON HCL INJECTION 0.25 MG/5ML
0.2500 mg | Freq: Once | INTRAVENOUS | Status: AC
  Administered 2024-01-28: 0.25 mg via INTRAVENOUS
  Filled 2024-01-28: qty 5

## 2024-01-28 MED ORDER — SODIUM CHLORIDE 0.9 % IV SOLN
200.0000 mg | Freq: Once | INTRAVENOUS | Status: AC
  Administered 2024-01-28: 200 mg via INTRAVENOUS
  Filled 2024-01-28: qty 200

## 2024-01-28 MED ORDER — SODIUM CHLORIDE 0.9% FLUSH: 10.0000 mL | INTRAVENOUS | Status: DC | PRN

## 2024-01-28 MED ORDER — DEXAMETHASONE SOD PHOSPHATE PF 10 MG/ML IJ SOLN
10.0000 mg | Freq: Once | INTRAMUSCULAR | Status: AC
  Administered 2024-01-28: 10 mg via INTRAVENOUS
  Filled 2024-01-28: qty 1

## 2024-01-28 MED ORDER — SODIUM CHLORIDE 0.9 % IV SOLN
150.0000 mg | Freq: Once | INTRAVENOUS | Status: AC
  Administered 2024-01-28: 150 mg via INTRAVENOUS
  Filled 2024-01-28: qty 150

## 2024-01-28 MED ORDER — SODIUM CHLORIDE 0.9 % IV SOLN
500.0000 mg/m2 | Freq: Once | INTRAVENOUS | Status: AC
  Administered 2024-01-28: 1100 mg via INTRAVENOUS
  Filled 2024-01-28: qty 40

## 2024-01-28 MED ORDER — SODIUM CHLORIDE 0.9 % IV SOLN: INTRAVENOUS | Status: DC

## 2024-01-28 MED ORDER — SODIUM CHLORIDE 0.9 % IV SOLN
456.5000 mg | Freq: Once | INTRAVENOUS | Status: AC
  Administered 2024-01-28: 460 mg via INTRAVENOUS
  Filled 2024-01-28: qty 46

## 2024-01-28 NOTE — Patient Instructions (Signed)
 [facility] - A DEPT OF [facility]  Discharge Instructions: Thank you for choosing [facility] to provide your oncology and hematology care.   If you have a lab appointment with the Cancer Center, please go directly to the Cancer Center and check in at the registration area.   Wear comfortable clothing and clothing appropriate for easy access to any Portacath or PICC line.   We strive to give you quality time with your provider. You may need to reschedule your appointment if you arrive late (15 or more minutes).  Arriving late affects you and other patients whose appointments are after yours.  Also, if you miss three or more appointments without notifying the office, you may be dismissed from the clinic at the provider's discretion.      For prescription refill requests, have your pharmacy contact our office and allow 72 hours for refills to be completed.    Today you received the following chemotherapy and/or immunotherapy agents: Keytruda, Alimta, Carboplatin      To help prevent nausea and vomiting after your treatment, we encourage you to take your nausea medication as directed.  BELOW ARE SYMPTOMS THAT SHOULD BE REPORTED IMMEDIATELY: *FEVER GREATER THAN 100.4 F (38 C) OR HIGHER *CHILLS OR SWEATING *NAUSEA AND VOMITING THAT IS NOT CONTROLLED WITH YOUR NAUSEA MEDICATION *UNUSUAL SHORTNESS OF BREATH *UNUSUAL BRUISING OR BLEEDING *URINARY PROBLEMS (pain or burning when urinating, or frequent urination) *BOWEL PROBLEMS (unusual diarrhea, constipation, pain near the anus) TENDERNESS IN MOUTH AND THROAT WITH OR WITHOUT PRESENCE OF ULCERS (sore throat, sores in mouth, or a toothache) UNUSUAL RASH, SWELLING OR PAIN  UNUSUAL VAGINAL DISCHARGE OR ITCHING   Items with * indicate a potential emergency and should be followed up as soon as possible or go to the Emergency Department if any problems should occur.  Please show the CHEMOTHERAPY ALERT  CARD or IMMUNOTHERAPY ALERT CARD at check-in to the Emergency Department and triage nurse.  Should you have questions after your visit or need to cancel or reschedule your appointment, please contact [facility] - A DEPT [facility]  Dept: [phone]  and follow the prompts.  Office hours are 8:00 a.m. to 4:30 p.m. Monday - Friday. Please note that voicemails left after 4:00 p.m. may not be returned until the following business day.  We are closed weekends and major holidays. You have access to a nurse at all times for urgent questions. Please call the main number to the clinic Dept: [phone] and follow the prompts.   For any non-urgent questions, you may also contact your provider using MyChart. We now offer e-Visits for anyone 86 and older to request care online for non-urgent symptoms. For details visit [email or web address].   Also download the MyChart app! Go to the app store, search "MyChart", open the app, select , and log in with your MyChart username and password.  Pembrolizumab Injection What is this medication? PEMBROLIZUMAB (PEM broe LIZ ue mab) treats some types of cancer. It works by helping your immune system slow or stop the spread of cancer cells. It is a monoclonal antibody. This medicine may be used for other purposes; ask your health care provider or pharmacist if you have questions. COMMON BRAND NAME(S): Keytruda What should I tell my care team before I take this medication? They need to know if you have any of these conditions: Allogeneic stem cell transplant (uses someone else's  stem cells) Autoimmune diseases, such as Crohn disease, ulcerative colitis, lupus History of chest radiation Nervous system problems, such as Guillain-Barre syndrome, myasthenia gravis Organ transplant An unusual or allergic reaction to pembrolizumab, other medications, foods, dyes, or preservatives Pregnant or trying to get  pregnant Breast-feeding How should I use this medication? This medication is injected into a vein. It is given by your care team in a hospital or clinic setting. A special MedGuide will be given to you before each treatment. Be sure to read this information carefully each time. Talk to your care team about the use of this medication in children. While it may be prescribed for children as young as 6 months for selected conditions, precautions do apply. Overdosage: If you think you have taken too much of this medicine contact a poison control center or emergency room at once. NOTE: This medicine is only for you. Do not share this medicine with others. What if I miss a dose? Keep appointments for follow-up doses. It is important not to miss your dose. Call your care team if you are unable to keep an appointment. What may interact with this medication? Interactions have not been studied. This list may not describe all possible interactions. Give your health care provider a list of all the medicines, herbs, non-prescription drugs, or dietary supplements you use. Also tell them if you smoke, drink alcohol, or use illegal drugs. Some items may interact with your medicine. What should I watch for while using this medication? Your condition will be monitored carefully while you are receiving this medication. You may need blood work while taking this medication. This medication may cause serious skin reactions. They can happen weeks to months after starting the medication. Contact your care team right away if you notice fevers or flu-like symptoms with a rash. The rash may be red or purple and then turn into blisters or peeling of the skin. You may also notice a red rash with swelling of the face, lips, or lymph nodes in your neck or under your arms. Tell your care team right away if you have any change in your eyesight. Talk to your care team if you may be pregnant. Serious birth defects can occur if you  take this medication during pregnancy and for 4 months after the last dose. You will need a negative pregnancy test before starting this medication. Contraception is recommended while taking this medication and for 4 months after the last dose. Your care team can help you find the option that works for you. Do not breastfeed while taking this medication and for 4 months after the last dose. What side effects may I notice from receiving this medication? Side effects that you should report to your care team as soon as possible: Allergic reactions--skin rash, itching, hives, swelling of the face, lips, tongue, or throat Dry cough, shortness of breath or trouble breathing Eye pain, redness, irritation, or discharge with blurry or decreased vision Heart muscle inflammation--unusual weakness or fatigue, shortness of breath, chest pain, fast or irregular heartbeat, dizziness, swelling of the ankles, feet, or hands Hormone gland problems--headache, sensitivity to light, unusual weakness or fatigue, dizziness, fast or irregular heartbeat, increased sensitivity to cold or heat, excessive sweating, constipation, hair loss, increased thirst or amount of urine, tremors or shaking, irritability Infusion reactions--chest pain, shortness of breath or trouble breathing, feeling faint or lightheaded Kidney injury (glomerulonephritis)--decrease in the amount of urine, red or dark brown urine, foamy or bubbly urine, swelling of the ankles, hands,  or feet Liver injury--right upper belly pain, loss of appetite, nausea, light-colored stool, dark yellow or brown urine, yellowing skin or eyes, unusual weakness or fatigue Pain, tingling, or numbness in the hands or feet, muscle weakness, change in vision, confusion or trouble speaking, loss of balance or coordination, trouble walking, seizures Rash, fever, and swollen lymph nodes Redness, blistering, peeling, or loosening of the skin, including inside the mouth Sudden or  severe stomach pain, bloody diarrhea, fever, nausea, vomiting Side effects that usually do not require medical attention (report to your care team if they continue or are bothersome): Bone, joint, or muscle pain Diarrhea Fatigue Loss of appetite Nausea Skin rash This list may not describe all possible side effects. Call your doctor for medical advice about side effects. You may report side effects to FDA at 1-800-FDA-1088. Where should I keep my medication? This medication is given in a hospital or clinic. It will not be stored at home. NOTE: This sheet is a summary. It may not cover all possible information. If you have questions about this medicine, talk to your doctor, pharmacist, or health care provider.  2024 Elsevier/Gold Standard (2021-05-08 00:00:00)  Pemetrexed Injection What is this medication? PEMETREXED (PEM e TREX ed) treats some types of cancer. It works by slowing down the growth of cancer cells. This medicine may be used for other purposes; ask your health care provider or pharmacist if you have questions. COMMON BRAND NAME(S): Alimta, PEMFEXY, PEMRYDI RTU What should I tell my care team before I take this medication? They need to know if you have any of these conditions: Infection, such as chickenpox, cold sores, or herpes Kidney disease Low blood cell levels (white cells, red cells, and platelets) Lung or breathing disease, such as asthma Radiation therapy An unusual or allergic reaction to pemetrexed, other medications, foods, dyes, or preservatives If you or your partner are pregnant or trying to get pregnant Breast-feeding How should I use this medication? This medication is injected into a vein. It is given by your care team in a hospital or clinic setting. Talk to your care team about the use of this medication in children. Special care may be needed. Overdosage: If you think you have taken too much of this medicine contact a poison control center or emergency  room at once. NOTE: This medicine is only for you. Do not share this medicine with others. What if I miss a dose? Keep appointments for follow-up doses. It is important not to miss your dose. Call your care team if you are unable to keep an appointment. What may interact with this medication? Do not take this medication with any of the following: Live virus vaccines This medication may also interact with the following: Ibuprofen This list may not describe all possible interactions. Give your health care provider a list of all the medicines, herbs, non-prescription drugs, or dietary supplements you use. Also tell them if you smoke, drink alcohol, or use illegal drugs. Some items may interact with your medicine. What should I watch for while using this medication? Your condition will be monitored carefully while you are receiving this medication. This medication may make you feel generally unwell. This is not uncommon as chemotherapy can affect healthy cells as well as cancer cells. Report any side effects. Continue your course of treatment even though you feel ill unless your care team tells you to stop. This medication can cause serious side effects. To reduce the risk, your care team may give you other medications  to take before receiving this one. Be sure to follow the directions from your care team. This medication can cause a rash or redness in areas of the body that have previously had radiation therapy. If you have had radiation therapy, tell your care team if you notice a rash in this area. This medication may increase your risk of getting an infection. Call your care team for advice if you get a fever, chills, sore throat, or other symptoms of a cold or flu. Do not treat yourself. Try to avoid being around people who are sick. Be careful brushing or flossing your teeth or using a toothpick because you may get an infection or bleed more easily. If you have any dental work done, tell your  dentist you are receiving this medication. Avoid taking medications that contain aspirin, acetaminophen, ibuprofen, naproxen, or ketoprofen unless instructed by your care team. These medications may hide a fever. Check with your care team if you have severe diarrhea, nausea, and vomiting, or if you sweat a lot. The loss of too much body fluid may make it dangerous for you to take this medication. Talk to your care team if you or your partner wish to become pregnant or think either of you might be pregnant. This medication can cause serious birth defects if taken during pregnancy and for 6 months after the last dose. A negative pregnancy test is required before starting this medication. A reliable form of contraception is recommended while taking this medication and for 6 months after the last dose. Talk to your care team about reliable forms of contraception. Do not father a child while taking this medication and for 3 months after the last dose. Use a condom while having sex during this time period. Do not breastfeed while taking this medication and for 1 week after the last dose. This medication may cause infertility. Talk to your care team if you are concerned about your fertility. What side effects may I notice from receiving this medication? Side effects that you should report to your care team as soon as possible: Allergic reactions--skin rash, itching, hives, swelling of the face, lips, tongue, or throat Dry cough, shortness of breath or trouble breathing Infection--fever, chills, cough, sore throat, wounds that don't heal, pain or trouble when passing urine, general feeling of discomfort or being unwell Kidney injury--decrease in the amount of urine, swelling of the ankles, hands, or feet Low red blood cell level--unusual weakness or fatigue, dizziness, headache, trouble breathing Redness, blistering, peeling, or loosening of the skin, including inside the mouth Unusual bruising or  bleeding Side effects that usually do not require medical attention (report to your care team if they continue or are bothersome): Fatigue Loss of appetite Nausea Vomiting This list may not describe all possible side effects. Call your doctor for medical advice about side effects. You may report side effects to FDA at 1-800-FDA-1088. Where should I keep my medication? This medication is given in a hospital or clinic. It will not be stored at home. NOTE: This sheet is a summary. It may not cover all possible information. If you have questions about this medicine, talk to your doctor, pharmacist, or health care provider.  2024 Elsevier/Gold Standard (2021-05-01 00:00:00)  Carboplatin Injection What is this medication? CARBOPLATIN (KAR boe pla tin) treats some types of cancer. It works by slowing down the growth of cancer cells. This medicine may be used for other purposes; ask your health care provider or pharmacist if you have questions. COMMON  BRAND NAME(S): Paraplatin What should I tell my care team before I take this medication? They need to know if you have any of these conditions: Blood disorders Hearing problems Kidney disease Recent or ongoing radiation therapy An unusual or allergic reaction to carboplatin, cisplatin, other medications, foods, dyes, or preservatives Pregnant or trying to get pregnant Breast-feeding How should I use this medication? This medication is injected into a vein. It is given by your care team in a hospital or clinic setting. Talk to your care team about the use of this medication in children. Special care may be needed. Overdosage: If you think you have taken too much of this medicine contact a poison control center or emergency room at once. NOTE: This medicine is only for you. Do not share this medicine with others. What if I miss a dose? Keep appointments for follow-up doses. It is important not to miss your dose. Call your care team if you are  unable to keep an appointment. What may interact with this medication? Medications for seizures Some antibiotics, such as amikacin, gentamicin, neomycin, streptomycin, tobramycin Vaccines This list may not describe all possible interactions. Give your health care provider a list of all the medicines, herbs, non-prescription drugs, or dietary supplements you use. Also tell them if you smoke, drink alcohol, or use illegal drugs. Some items may interact with your medicine. What should I watch for while using this medication? Your condition will be monitored carefully while you are receiving this medication. You may need blood work while taking this medication. This medication may make you feel generally unwell. This is not uncommon, as chemotherapy can affect healthy cells as well as cancer cells. Report any side effects. Continue your course of treatment even though you feel ill unless your care team tells you to stop. In some cases, you may be given additional medications to help with side effects. Follow all directions for their use. This medication may increase your risk of getting an infection. Call your care team for advice if you get a fever, chills, sore throat, or other symptoms of a cold or flu. Do not treat yourself. Try to avoid being around people who are sick. Avoid taking medications that contain aspirin, acetaminophen, ibuprofen, naproxen, or ketoprofen unless instructed by your care team. These medications may hide a fever. Be careful brushing or flossing your teeth or using a toothpick because you may get an infection or bleed more easily. If you have any dental work done, tell your dentist you are receiving this medication. Talk to your care team if you wish to become pregnant or think you might be pregnant. This medication can cause serious birth defects. Talk to your care team about effective forms of contraception. Do not breast-feed while taking this medication. What side effects  may I notice from receiving this medication? Side effects that you should report to your care team as soon as possible: Allergic reactions--skin rash, itching, hives, swelling of the face, lips, tongue, or throat Infection--fever, chills, cough, sore throat, wounds that don't heal, pain or trouble when passing urine, general feeling of discomfort or being unwell Low red blood cell level--unusual weakness or fatigue, dizziness, headache, trouble breathing Pain, tingling, or numbness in the hands or feet, muscle weakness, change in vision, confusion or trouble speaking, loss of balance or coordination, trouble walking, seizures Unusual bruising or bleeding Side effects that usually do not require medical attention (report to your care team if they continue or are bothersome): Hair loss Nausea  Unusual weakness or fatigue Vomiting This list may not describe all possible side effects. Call your doctor for medical advice about side effects. You may report side effects to FDA at 1-800-FDA-1088. Where should I keep my medication? This medication is given in a hospital or clinic. It will not be stored at home. NOTE: This sheet is a summary. It may not cover all possible information. If you have questions about this medicine, talk to your doctor, pharmacist, or health care provider.  2024 Elsevier/Gold Standard (2021-04-17 00:00:00)

## 2024-01-29 ENCOUNTER — Ambulatory Visit: Admitting: Pulmonary Disease

## 2024-02-02 ENCOUNTER — Inpatient Hospital Stay

## 2024-02-03 ENCOUNTER — Inpatient Hospital Stay: Admitting: Nurse Practitioner

## 2024-02-03 ENCOUNTER — Encounter: Payer: Self-pay | Admitting: Nurse Practitioner

## 2024-02-03 DIAGNOSIS — G893 Neoplasm related pain (acute) (chronic): Secondary | ICD-10-CM

## 2024-02-03 DIAGNOSIS — K5903 Drug induced constipation: Secondary | ICD-10-CM

## 2024-02-03 DIAGNOSIS — G620 Drug-induced polyneuropathy: Secondary | ICD-10-CM | POA: Diagnosis not present

## 2024-02-03 DIAGNOSIS — C3491 Malignant neoplasm of unspecified part of right bronchus or lung: Secondary | ICD-10-CM

## 2024-02-03 DIAGNOSIS — T451X5S Adverse effect of antineoplastic and immunosuppressive drugs, sequela: Secondary | ICD-10-CM | POA: Diagnosis not present

## 2024-02-03 DIAGNOSIS — M792 Neuralgia and neuritis, unspecified: Secondary | ICD-10-CM

## 2024-02-03 DIAGNOSIS — Z515 Encounter for palliative care: Secondary | ICD-10-CM | POA: Diagnosis not present

## 2024-02-03 DIAGNOSIS — Z87891 Personal history of nicotine dependence: Secondary | ICD-10-CM

## 2024-02-03 DIAGNOSIS — R530 Neoplastic (malignant) related fatigue: Secondary | ICD-10-CM

## 2024-02-03 DIAGNOSIS — Z7189 Other specified counseling: Secondary | ICD-10-CM

## 2024-02-03 DIAGNOSIS — C3490 Malignant neoplasm of unspecified part of unspecified bronchus or lung: Secondary | ICD-10-CM

## 2024-02-03 MED ORDER — OXYCODONE-ACETAMINOPHEN 10-325 MG PO TABS: 1.0000 | ORAL_TABLET | Freq: Three times a day (TID) | ORAL | 0 refills | Status: AC | PRN

## 2024-02-03 NOTE — Progress Notes (Signed)
 "    Palliative Medicine [name]  [phone] [phone]   Name: Patrick Harris Date: 02/03/2024 MRN: [id number]  DOB: 30-Apr-1945  Patient Care Team: Geofm Glade PARAS, MD as PCP - General (Internal Medicine) Debera Jayson MATSU, MD as PCP - Cardiology (Cardiology) Aneita Gwendlyn DASEN, MD (Inactive) as Consulting Physician (Gastroenterology) Loetta Nottingham, MD as Consulting Physician (Endocrinology) Fate Morna SAILOR, [facility] (Inactive) as Pharmacist (Pharmacist) Leslee Reusing, MD as Consulting Physician (Ophthalmology) Patel, Donika K, DO as Consulting Physician (Neurology)   I connected with Patrick Harris on 02/03/24 at 10:00 AM EST by Video and verified that I am speaking with the correct person using two identifiers.   I discussed the limitations, risks, security and privacy concerns of performing an evaluation and management service by telemedicine and the availability of in-person appointments. I also discussed with the patient that there may be a patient responsible charge related to this service. The patient expressed understanding and agreed to proceed.   Other persons participating in the visit and their role in the encounter: N/A   Patients location: Home   Providers location: [facility]   REASON FOR CONSULTATION: Patrick Harris is a 79 y.o. male with oncologic medical history including recurrent and progressive non-small cell lung cancer with initial diagnosis (June 2023), now with disease progression with enlarging right paratracheal lymph node (12/2023) s/p neoadjuvant systemic chemotherapy and right upper lobectomy with lymph node dissection [facility]), palliative radiation on paratracheal lymph noded (03/2023). He is currently on systemic chemoimmunotherapy carboplatin , Alimta , and Keytruda  every 3 weeks.  Palliative is seeing patient for symptom management and goals of care.    SOCIAL HISTORY:     reports that he quit  smoking about 2 years ago. His smoking use included cigarettes. He smoked an average of 0.3 packs per day. He has been exposed to tobacco smoke. He has never used smokeless tobacco. He reports that he does not drink alcohol and does not use drugs.  ADVANCE DIRECTIVES:  None on file. Patient reports documents are in place naming his Son-In-Law Patrick Harris as primary management consultant. Instructed to bring in copies of documents.   CODE STATUS: Full code  PAST MEDICAL HISTORY: Past Medical History:  Diagnosis Date   Arthritis    CAD (coronary artery disease)    CABG with LIMA to LAD and RIMA to RCA August 2000 - Dr. Kerrin   Cancer [facility])    basal cell   CHF (congestive heart failure) (HCC)    COPD (chronic obstructive pulmonary disease) (HCC)    Elevated glycated hemoglobin    Essential hypertension    GERD (gastroesophageal reflux disease)    Heart murmur    History of radiation therapy    Right lung(chest) 03/19/22-05/06/22- Dr. Lynwood Nasuti   History of radiation therapy    Right Lung- 02/25/23-03/17/23- Dr. Lynwood Nasuti   Hyperlipidemia    Hypopituitarism    Lung cancer [facility])    Osteopenia    Dr Loetta   Pancreatitis    Peyronie disease    Shingles    Testosterone  deficiency    Dr Loetta    PAST SURGICAL HISTORY:  Past Surgical History:  Procedure Laterality Date   Bone spur  2006   5th toe bilaterally   BRONCHIAL BIOPSY  07/02/2021   Procedure: BRONCHIAL BIOPSIES;  Surgeon: Shelah Lamar RAMAN, MD;  Location: [facility] ENDOSCOPY;  Service: Pulmonary;;   BRONCHIAL BRUSHINGS  07/02/2021   Procedure: BRONCHIAL BRUSHINGS;  Surgeon: Shelah,  Lamar RAMAN, MD;  Location: [facility] ENDOSCOPY;  Service: Pulmonary;;   BRONCHIAL NEEDLE ASPIRATION BIOPSY  07/02/2021   Procedure: BRONCHIAL NEEDLE ASPIRATION BIOPSIES;  Surgeon: Shelah Lamar RAMAN, MD;  Location: [facility] ENDOSCOPY;  Service: Pulmonary;;   COLONOSCOPY  2007   negative; Dr Jakie   CORONARY ARTERY BYPASS GRAFT  2001   2 vessels   FIDUCIAL  MARKER PLACEMENT  07/02/2021   Procedure: FIDUCIAL MARKER PLACEMENT;  Surgeon: Shelah Lamar RAMAN, MD;  Location: [facility] ENDOSCOPY;  Service: Pulmonary;;   fracture heel  1967   Bil/ due to fall off ladder   Fractured calcaneus  1969   bilaterally w/ ankle fusion   HERNIA REPAIR  1984   right inguinal hernia   KNEE SURGERY Left    LOBECTOMY Right 11/13/2021   MENISECTOMY  2007   R medial, Dr. Barbarann SKEETER CATH INSERTION     SHOULDER SURGERY  1994   left   UPPER GASTROINTESTINAL ENDOSCOPY  2007   GERD, Dr Jakie   VASECTOMY     VIDEO BRONCHOSCOPY WITH RADIAL ENDOBRONCHIAL ULTRASOUND  07/02/2021   Procedure: VIDEO BRONCHOSCOPY WITH RADIAL ENDOBRONCHIAL ULTRASOUND;  Surgeon: Shelah Lamar RAMAN, MD;  Location: [facility] ENDOSCOPY;  Service: Pulmonary;;    HEMATOLOGY/ONCOLOGY HISTORY:  Oncology History  Non-small cell cancer of right lung (HCC)  08/23/2021 Initial Diagnosis   Non-small cell cancer of right lung (HCC)   03/20/2022 - 04/08/2022 Chemotherapy   Patient is on Treatment Plan : LUNG Pemetrexed  + Carboplatin  q21d     05/28/2022 - 05/28/2022 Chemotherapy   Patient is on Treatment Plan : LUNG NSCLC Durvalumab  (1500) q28d     01/28/2024 -  Chemotherapy   Patient is on Treatment Plan : LUNG Carboplatin  (5) + Pemetrexed  (500) + Pembrolizumab  (200) D1 q21d Induction x 4 cycles / Maintenance Pemetrexed  (500) + Pembrolizumab  (200) D1 q21d       ALLERGIES:  is allergic to latex, bromocriptine, zestril  [lisinopril ], diovan  [valsartan ], hydrochlorothiazide , norvasc  [amlodipine ], and zocor [simvastatin].  MEDICATIONS:  Current Outpatient Medications  Medication Sig Dispense Refill   albuterol  (VENTOLIN  HFA) 108 (90 Base) MCG/ACT inhaler Inhale 1-2 puffs into the lungs every 6 (six) hours as needed for wheezing or shortness of breath. 6.7 g 0   amitriptyline  (ELAVIL ) 10 MG tablet TAKE 1 TABLET BY MOUTH EVERYDAY AT BEDTIME 30 tablet 5   aspirin  EC 81 MG tablet Take 1 tablet (81 mg total) by mouth at  bedtime. Okay to restart this medication on 07/03/2021 30 tablet 11   azelastine  (ASTELIN ) 0.1 % nasal spray Place 1 spray into both nostrils 2 (two) times daily. Use in each nostril as directed 30 mL 0   benzonatate  (TESSALON ) 100 MG capsule Take 1 capsule (100 mg total) by mouth 3 (three) times daily as needed for cough. Do not take with alcohol or while operating or driving heavy machinery 21 capsule 0   cabergoline (DOSTINEX) 0.5 MG tablet Take 0.25 mg by mouth 2 (two) times a week.     cefUROXime  (CEFTIN ) 500 MG tablet Take 1 tablet (500 mg total) by mouth 2 (two) times daily with a meal. (Patient not taking: Reported on 01/12/2024) 14 tablet 0   Cholecalciferol (VITAMIN D3) 2000 UNITS TABS Take 2,000 Units by mouth in the morning and at bedtime.     Coenzyme Q10 (COQ-10) 100 MG CAPS Take 100 mg by mouth in the morning.     dexamethasone  (DECADRON ) 4 MG tablet Take 1 tab 2 times daily starting day  before pemetrexed . Then take 2 tabs daily x 3 days starting day after carboplatin . Take with food. 30 tablet 1   doxycycline  (VIBRA -TABS) 100 MG tablet Take 1 tablet (100 mg total) by mouth 2 (two) times daily. 14 tablet 0   finasteride (PROSCAR) 5 MG tablet Take 5 mg by mouth daily.     folic acid  (FOLVITE ) 1 MG tablet Take 1 tablet (1 mg total) by mouth daily. Start 7 days before pemetrexed  chemotherapy. Continue until 21 days after pemetrexed  completed. 100 tablet 3   gabapentin  (NEURONTIN ) 100 MG capsule Take 1 capsule (100 mg total) by mouth 3 (three) times daily for 2 days, THEN 1 capsule (100 mg total) 2 (two) times daily for 2 days, THEN 1 capsule (100 mg total) daily at 6 (six) AM for 2 days. 12 capsule 0   gabapentin  (NEURONTIN ) 300 MG capsule Take 1 capsule (300 mg total) by mouth 3 (three) times daily. 90 capsule 3   HYDROcodone  bit-homatropine (HYCODAN) 5-1.5 MG/5ML syrup Take 5 mLs by mouth every 8 (eight) hours as needed for cough. 120 mL 0   irbesartan  (AVAPRO ) 75 MG tablet Take 1 tablet (75  mg total) by mouth daily. 90 tablet 3   lidocaine  (XYLOCAINE ) 2 % solution Use as directed 10 mLs in the mouth or throat every 3 (three) hours as needed. 100 mL 0   nitroGLYCERIN  (NITROSTAT ) 0.4 MG SL tablet Place 1 tablet (0.4 mg total) under the tongue every 5 (five) minutes x 3 doses as needed for chest pain (if no relief after 2nd dose, proceed to ED or call 911). 25 tablet 3   Omega-3 Fatty Acids (OMEGA-3 FISH OIL PO) Take 1,000 mg by mouth in the morning.     omeprazole  (PRILOSEC ) 40 MG capsule Take 1 capsule (40 mg total) by mouth daily. 90 capsule 3   ondansetron  (ZOFRAN ) 8 MG tablet Take 1 tablet (8 mg total) by mouth every 8 (eight) hours as needed for nausea or vomiting. Start on the third day after carboplatin . 30 tablet 1   oseltamivir  (TAMIFLU ) 75 MG capsule Take 1 capsule (75 mg total) by mouth every 12 (twelve) hours. 10 capsule 0   oxyCODONE -acetaminophen  (PERCOCET) 10-325 MG tablet Take 1 tablet by mouth every 8 (eight) hours as needed for pain. 30 tablet 0   polyethylene glycol (MIRALAX / GLYCOLAX) 17 g packet Take 17 g by mouth as needed.     prochlorperazine  (COMPAZINE ) 10 MG tablet Take 1 tablet (10 mg total) by mouth every 6 (six) hours as needed for nausea or vomiting. 30 tablet 1   rosuvastatin  (CRESTOR ) 20 MG tablet Take 1 tablet (20 mg total) by mouth daily. 90 tablet 3   sucralfate  (CARAFATE ) 1 g tablet Take 1 tablet (1 g total) by mouth 4 (four) times daily -  with meals and at bedtime. Crush and dissolve in 10 mL warm water prior to swallowing, take 20 min before meals 120 tablet 0   tamsulosin  (FLOMAX ) 0.4 MG CAPS capsule Take 0.4 mg by mouth in the morning.     Testosterone  20.25 MG/ACT (1.62%) GEL Apply 2 Pump topically in the morning.     Tiotropium Bromide-Olodaterol (STIOLTO RESPIMAT ) 2.5-2.5 MCG/ACT AERS Inhale 2 puffs into the lungs daily. 4 g 5   vitamin C (ASCORBIC ACID) 500 MG tablet Take 500 mg by mouth in the morning.     Vitamin E (VITAMIN E/D-ALPHA  NATURAL) 268 MG (400 UNIT) CAPS Take 1 tablet by mouth daily.  No current facility-administered medications for this visit.    VITAL SIGNS: There were no vitals taken for this visit. There were no vitals filed for this visit.  Estimated body mass index is 26.99 kg/m as calculated from the following:   Height as of 01/22/24: 5' 11.5 (1.816 m).   Weight as of 01/28/24: 196 lb 4 oz (89 kg).  LABS: CBC:    Component Value Date/Time   WBC 10.3 01/28/2024 0745   WBC 4.7 12/09/2023 1343   HGB 14.0 01/28/2024 0745   HCT 40.5 01/28/2024 0745   PLT 153 01/28/2024 0745   MCV 93.1 01/28/2024 0745   NEUTROABS 9.1 (H) 01/28/2024 0745   LYMPHSABS 0.6 (L) 01/28/2024 0745   MONOABS 0.5 01/28/2024 0745   EOSABS 0.0 01/28/2024 0745   BASOSABS 0.0 01/28/2024 0745   Comprehensive Metabolic Panel:    Component Value Date/Time   NA 136 01/28/2024 0745   K 4.0 01/28/2024 0745   CL 100 01/28/2024 0745   CO2 23 01/28/2024 0745   BUN 19 01/28/2024 0745   CREATININE 1.03 01/28/2024 0745   CREATININE 1.07 08/25/2019 0847   GLUCOSE 150 (H) 01/28/2024 0745   CALCIUM  9.4 01/28/2024 0745   AST 23 01/28/2024 0745   ALT 15 01/28/2024 0745   ALKPHOS 65 01/28/2024 0745   BILITOT 0.4 01/28/2024 0745   PROT 7.2 01/28/2024 0745   ALBUMIN 4.1 01/28/2024 0745    RADIOGRAPHIC STUDIES: No results found.  PERFORMANCE STATUS (ECOG) : 1 - Symptomatic but completely ambulatory  Review of Systems  Constitutional:  Positive for activity change and fatigue.  Musculoskeletal:  Positive for back pain and neck pain.  Unless otherwise noted, a complete review of systems is negative.  Physical Exam General: NAD Pulmonary: normal breathing pattern  Neurological: Alert and oriented x3  Discussed the use of AI scribe software for clinical note transcription with the patient, who gave verbal consent to proceed.  History of Present Illness Patrick Harris is a 79 year old male with recurrent non-small cell  cancer who I connected with by video visit for his initial palliative visit in the setting of inclement weather. No acute distress noted. No family present. Patient is alert and able to engage appropriately in discussions.   I introduced myself, Maygan RN, and Palliative's role in collaboration with the oncology team. Concept of Palliative Care was introduced as specialized medical care for people and their families living with serious illness.  It focuses on providing relief from the symptoms and stress of a serious illness.  The goal is to improve quality of life for both the patient and the family. Values and goals of care important to patient and family were attempted to be elicited.  He lives in the home with his wife of more than 59 years. He had one daughter who sadly passed away four years ago from cancer. he has two grandsons. 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  At home Mr. Keena is able to perform most ADLs independently with some limitations due to pain and fatigue. He reports urinary frequency, needing to urinate every hour, which has been ongoing for a couple of months. He has previously been treated with Flomax  and finasteride, which initially eased the symptoms, but its effectiveness has decreased. There is no associated pain with urination. Reports plans to follow-up with Urologist soon.   No issues with constipation or diarrhea. He occasionally uses Miralax as a stool softener, which he finds effective.  We discussed Mr. Kalter  pain at length. He experiences worsening pain in his arm and hand, described as 'bees stinging' and 'fingernails burning'. This pain began after receiving a flu shot and was later attributed to a tumor putting pressure on a nerve. He has a history of neuropathy from chemotherapy, which has been exacerbated by the current condition. He takes gabapentin  300 mg three times a day and oxycodone  once in the morning to manage the pain. The gabapentin  has  been used for a couple of months, and he finds it effective in managing the pain as long as he can sleep between bathroom visits. We discussed option of increasing gabapentin  to 600mg  at bedtime. He verbalized understanding however currently feels that his symptoms are manageable. No adjustments to regimen at this time but knows to contact office if symptoms worsen. We will continue with oxycodone  every 8 hours as needed.   Goals of Care We discussed Mr. Yohannes  current illness and what it means in the larger context of his on-going co-morbidities. Natural disease trajectory and expectations were discussed.  Mr. Stratton is realistic in his understanding of current cancer state and overall plan of care. He is is clear in expressed wishes to continue to treat the treatable allowing him every opportunity to remain stable.   I empathetically approached discussions regarding advanced directives. Patient reports he has documents completed acknowledging a need to bring in for filing. Rossi states in the event he is unable to speak for himself his son-in-law Patrick Harris would be his medical decision maker. He speaks to their close relationship.   I discussed the importance of continued conversation with family and their medical providers regarding overall plan of care and treatment options, ensuring decisions are within the context of the patients values and GOCs.  Established therapeutic relationship. Education provided on palliative's role in collaboration with their Oncology/Radiation team.  Assessment & Plan Neoplasm-related pain Chronic pain in the arm and hand, exacerbated by a tumor compressing a nerve. Described as a sensation of bees stinging and burning, particularly under the arm and in the fingers. Pain is severe enough to require oxycodone  for relief. Current regimen of gabapentin  300 mg three times a day is providing some relief, but adjustments may be needed if pain worsens. - Continue  gabapentin  300 mg three times a day. Discussed option to increase to 600mg  at bedtime.  - Continue oxycodone  as needed for severe pain, typically once in the morning. - Posted date oxycodone  10/325mg  every 8 hours as needed for moderate to severe pain. Refill sent to pharmacy.  - Contact provider if pain worsens or if medication adjustments are needed.  Chemotherapy-induced polyneuropathy Neuropathy exacerbated by neoplasm-related pain. Symptoms include burning sensation in the fingers and arm, worsened by the tumor's pressure on the nerve. - Continue current pain management regimen with gabapentin  and oxycodone .  Goals of Care Patient is realistic expressing goals to continue to treat the treatable allowing him every opportunity to continue thriving.  -Advanced directives completed per patient. Will bring in a copy to be filed. - In the event patient cannot speak for himself he identifies his son-in-law Patrick Harris as his medical decision-maker.  Follow-Up I will plan to see patient back in 3-4 weeks. Sooner if needed.   Patient expressed understanding and was in agreement with this plan. He also understands that He can call the clinic at any time with any questions, concerns, or complaints.   Thank you for your referral and allowing Palliative to assist in Mr. Brandun  E Granada's care.   Number and complexity of problems addressed: HIGH - 1 or more chronic illnesses with SEVERE exacerbation, progression, or side effects of treatment - advanced cancer, pain. Any controlled substances utilized were prescribed in the context of palliative care.  I provided 65 minutes of face-to-face video visit time during this encounter, and > 50% was spent counseling as documented under my assessment & plan. Visit consisted of counseling and education dealing with the complex and emotionally intense issues of symptom management and palliative care in the setting of serious and potentially life-threatening  illness.  Signed by: Levon Borer, AGPCNP-BC Palliative Medicine Team/[facility]    "

## 2024-02-06 ENCOUNTER — Telehealth: Payer: Self-pay

## 2024-02-06 NOTE — Telephone Encounter (Signed)
 Copied from CRM #[id number]. Topic: Referral - Request for Referral >> Feb 06, 2024  9:27 AM Avram MATSU wrote: Did the patient discuss referral with their provider in the last year? Yes (If No - schedule appointment) (If Yes - send message)  Appointment offered? Yes  Type of order/referral and detailed reason for visit: sinus issues   Preference of office, provider, location: [address] [facility] MD [place]   If referral order, have you been seen by this specialty before? Yes (If Yes, this issue or another issue? When? Where?  Can we respond through MyChart? Yes/ text

## 2024-02-09 ENCOUNTER — Inpatient Hospital Stay

## 2024-02-11 ENCOUNTER — Other Ambulatory Visit: Payer: Self-pay

## 2024-02-11 ENCOUNTER — Other Ambulatory Visit: Payer: Self-pay | Admitting: Pediatrics

## 2024-02-11 DIAGNOSIS — J349 Unspecified disorder of nose and nasal sinuses: Secondary | ICD-10-CM

## 2024-02-11 NOTE — Telephone Encounter (Signed)
 Referral placed today

## 2024-02-19 ENCOUNTER — Inpatient Hospital Stay: Admitting: Internal Medicine

## 2024-02-19 ENCOUNTER — Inpatient Hospital Stay

## 2024-02-24 ENCOUNTER — Inpatient Hospital Stay: Attending: Internal Medicine

## 2024-03-08 ENCOUNTER — Ambulatory Visit: Admitting: Internal Medicine

## 2024-03-11 ENCOUNTER — Inpatient Hospital Stay: Attending: Internal Medicine

## 2024-03-11 ENCOUNTER — Inpatient Hospital Stay

## 2024-03-11 ENCOUNTER — Inpatient Hospital Stay: Admitting: Physician Assistant

## 2024-03-15 ENCOUNTER — Inpatient Hospital Stay

## 2024-04-01 ENCOUNTER — Inpatient Hospital Stay

## 2024-04-01 ENCOUNTER — Inpatient Hospital Stay: Admitting: Internal Medicine

## 2024-04-05 ENCOUNTER — Ambulatory Visit

## 2024-04-22 ENCOUNTER — Inpatient Hospital Stay: Admitting: Internal Medicine

## 2024-04-22 ENCOUNTER — Inpatient Hospital Stay

## 2024-04-26 ENCOUNTER — Inpatient Hospital Stay

## 2024-04-27 IMAGING — DX DG ABDOMEN 1V
2 series · 2 of 2 positions shown · non-contrast
Comparison: 05/05/2019 CT enterography

CLINICAL DATA: Generalized abdominal pain and nausea

EXAM:
ABDOMEN - 1 VIEW

[abdomen kub (1 of 2)]
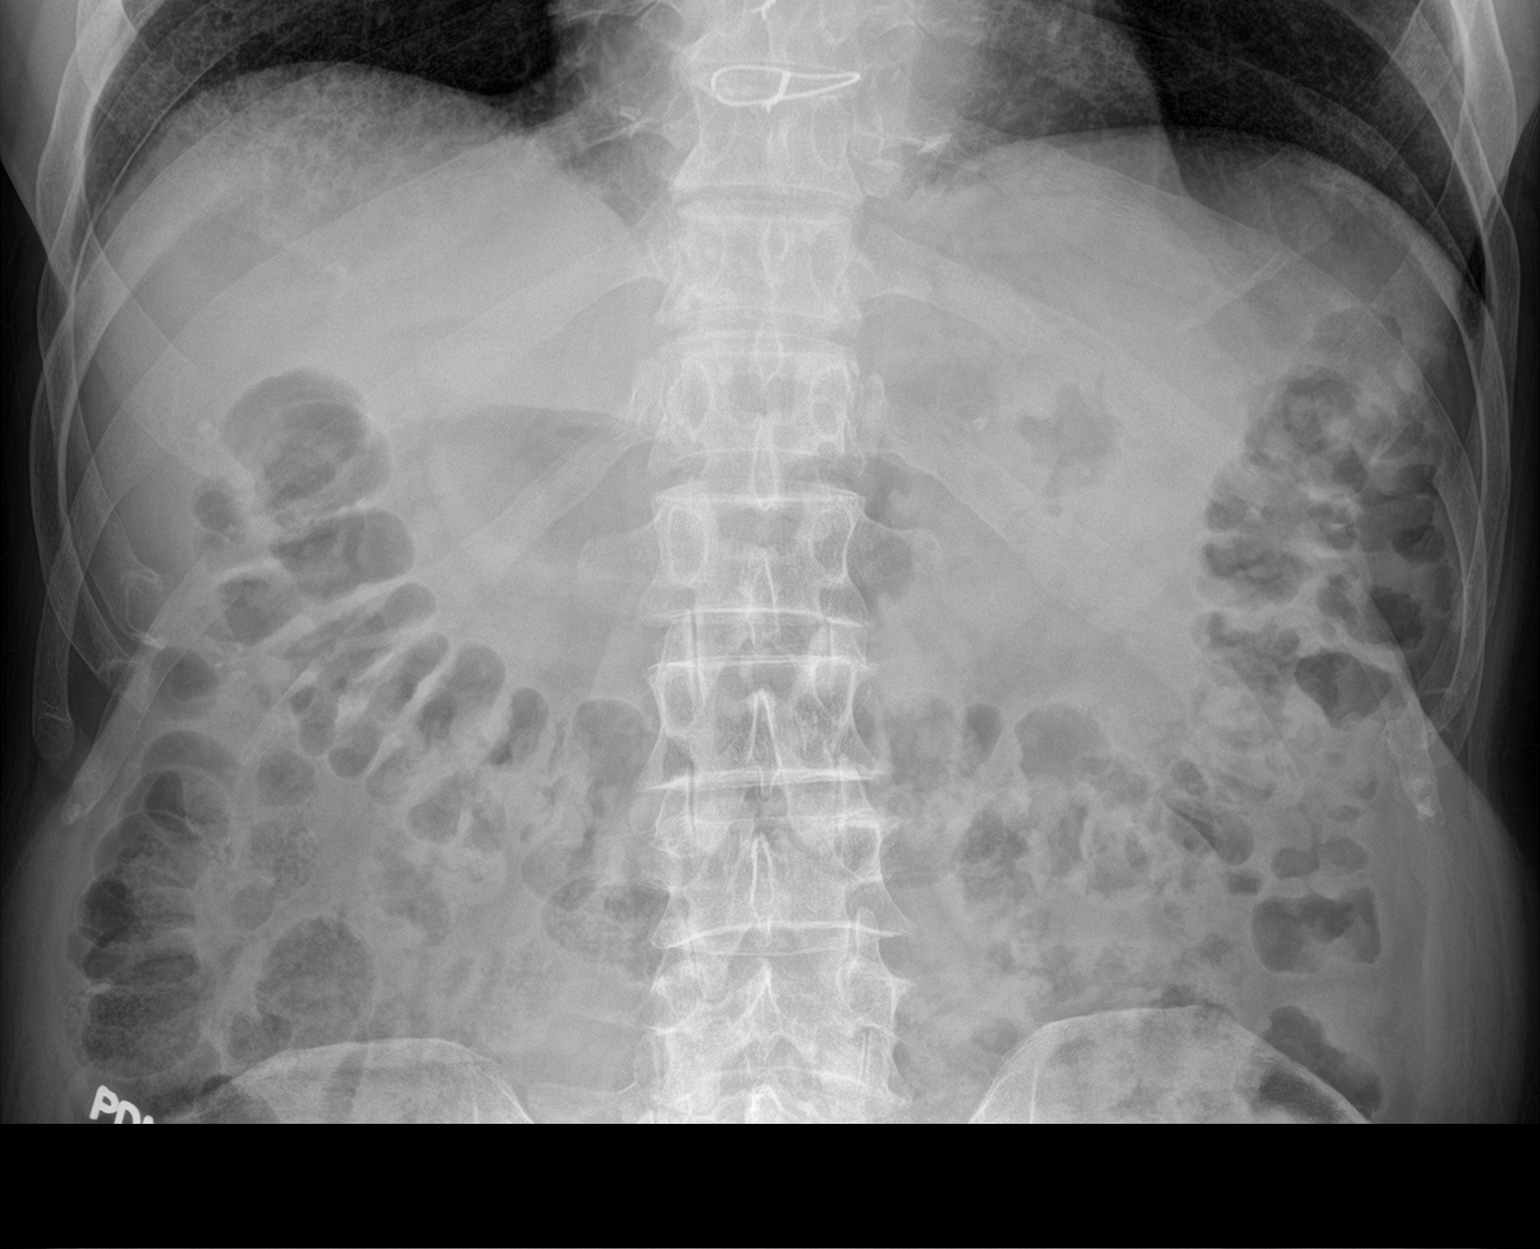

[abdomen kub (2 of 2)]
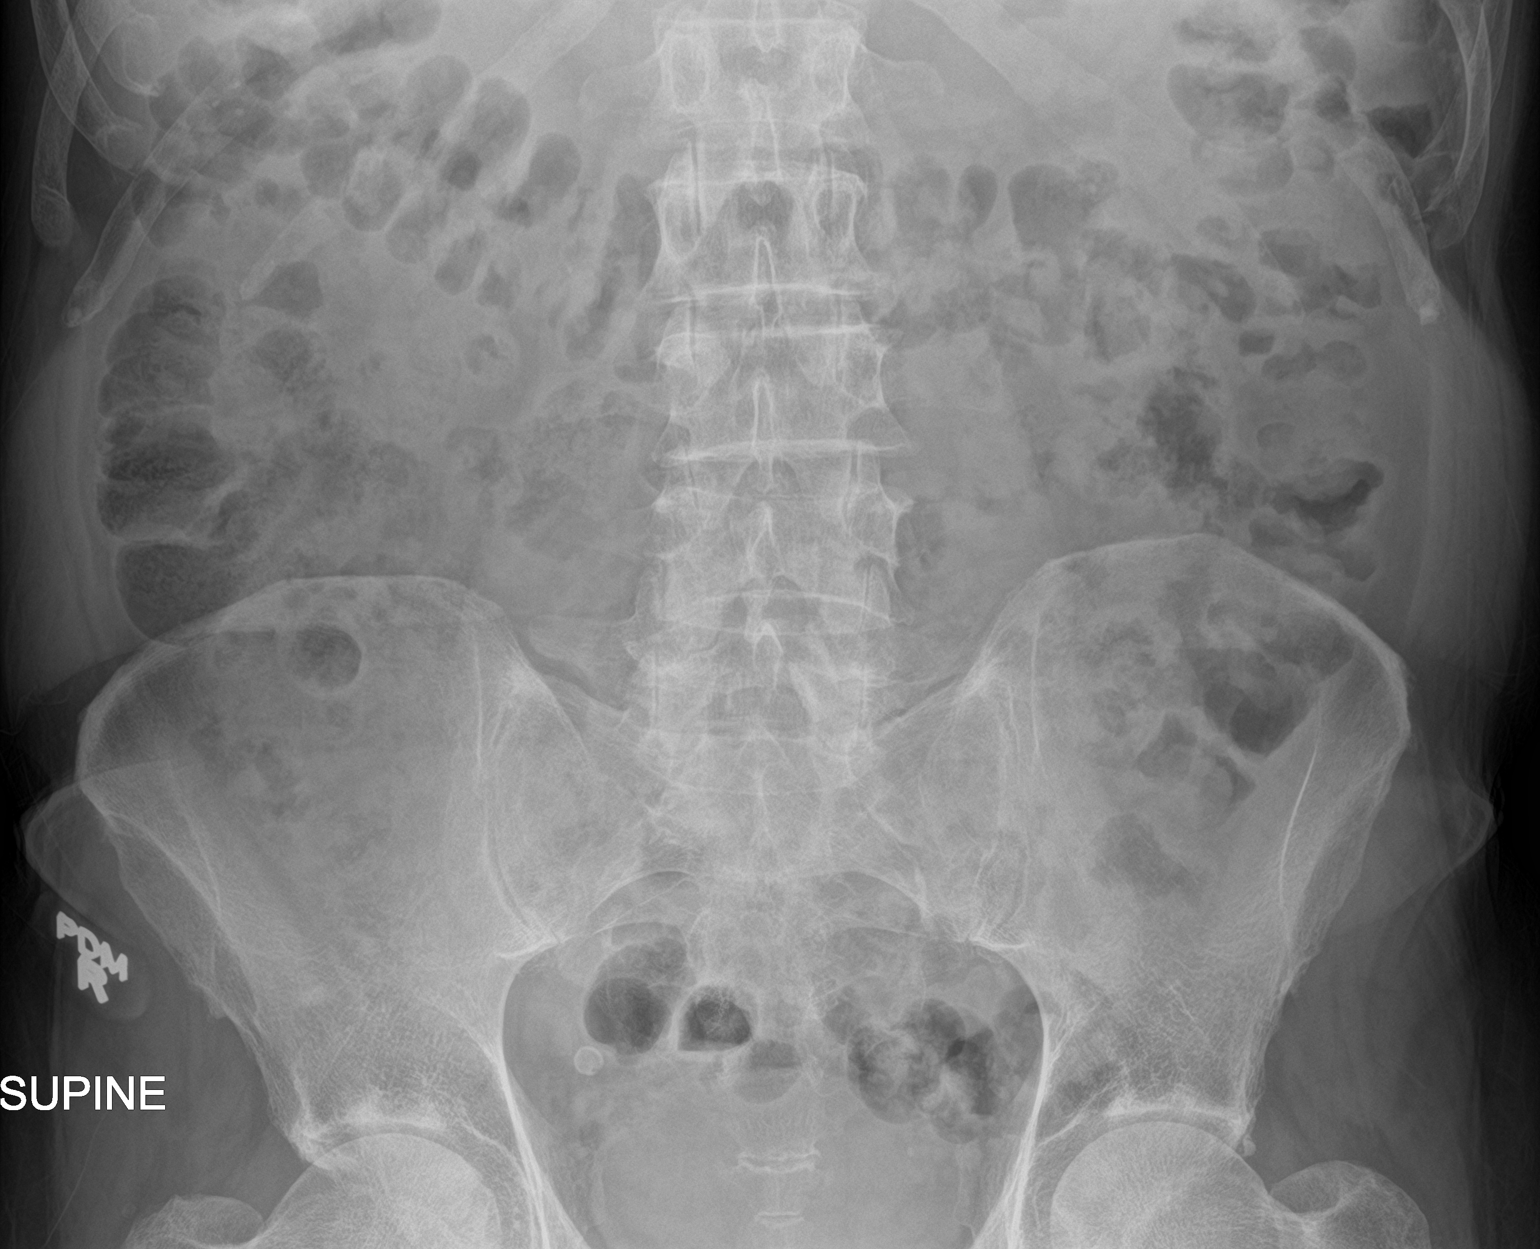

[2 of 2 positions shown; findings below may reference images not displayed]

FINDINGS: The bowel gas pattern is normal. No radio-opaque calculi or other
significant radiographic abnormality are seen.

Normal stool burden radiographically.

Chronic bibasilar fibrotic changes noted. Normal heart size. Broken
lower sternotomy wire noted. Slight degenerative changes of the
spine and of the visualized pelvis.
IMPRESSION: Negative for obstruction.  No acute finding by plain radiography.

## 2024-05-08 IMAGING — CT CT CHEST W/O CM
2 of 4 series · 15 of 36 positions shown, 18 images · non-contrast
Comparison: Chest radiographs done on 01/25/2018

CLINICAL DATA: Chronic smoking history, shortness of breath



[Series 2: thorax · axial · 0.77mm/px · z∈[-99,+181]mm · 12 of 166 slices shown, 15 images]
[im 13/166  mediastinal]
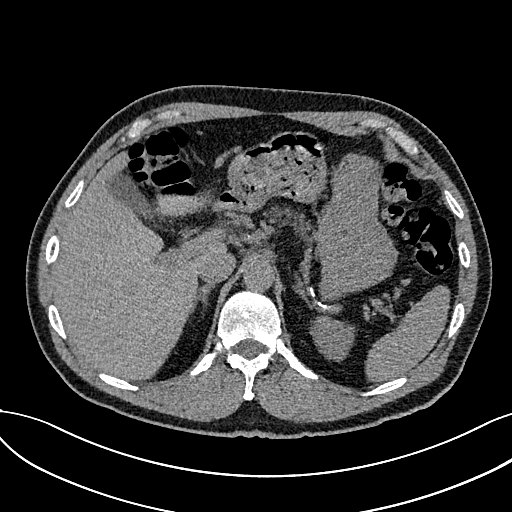
[im 13/166  lung]
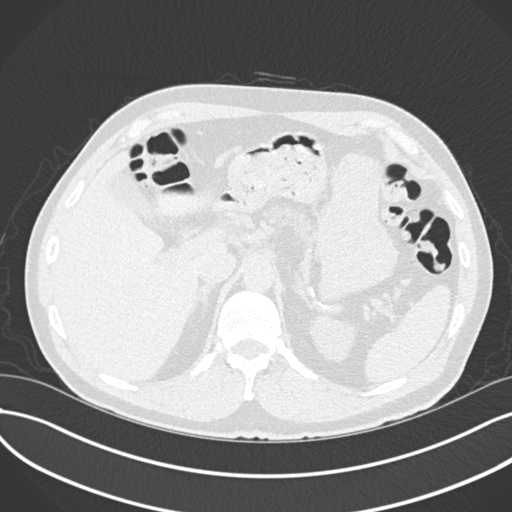
[im 26/166  lung]
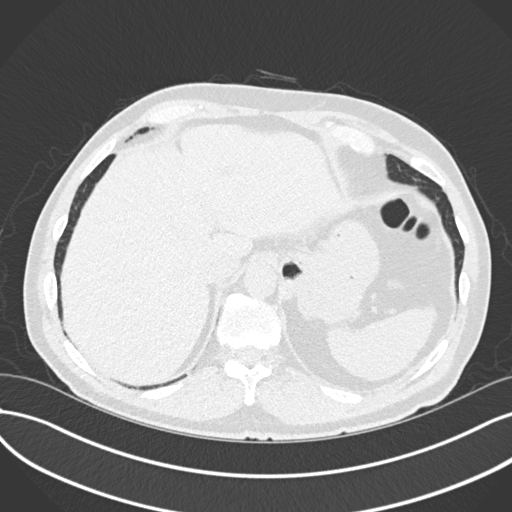
[im 39/166  lung]
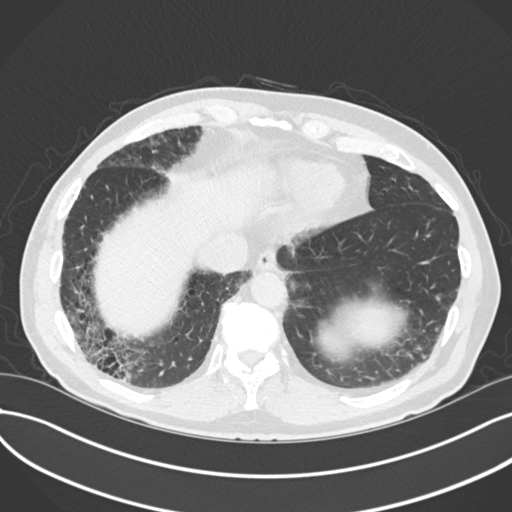
[im 51/166  lung]
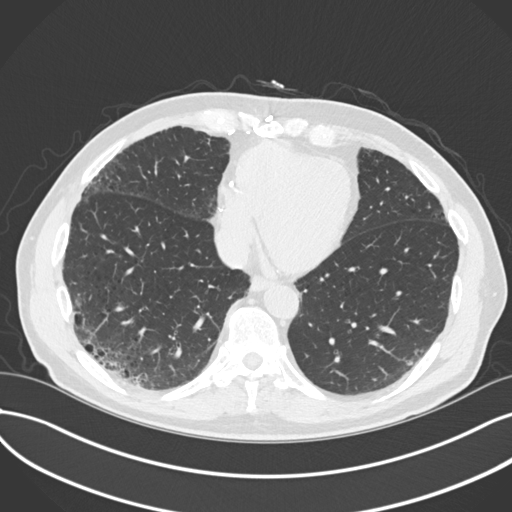
[im 64/166  mediastinal]
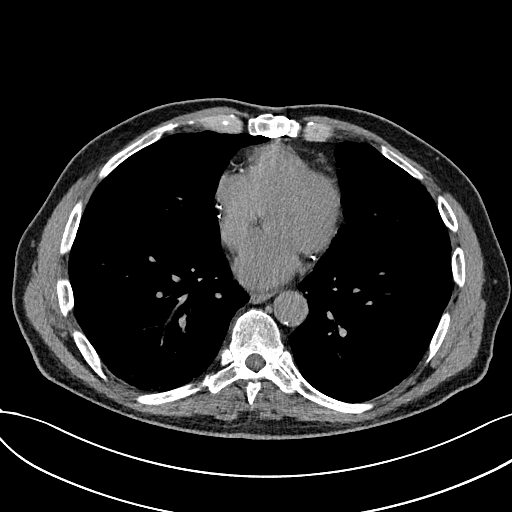
[im 64/166  lung]
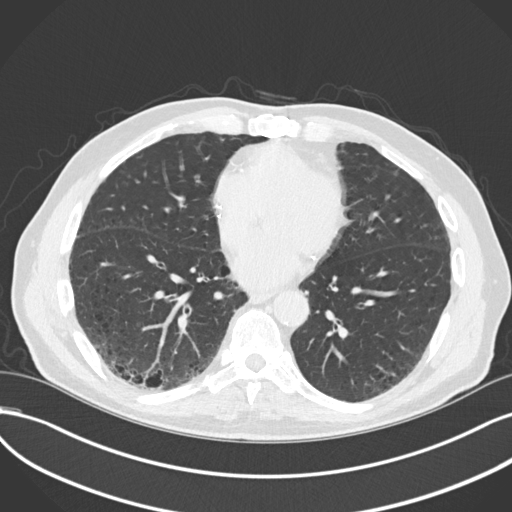
[im 77/166  lung]
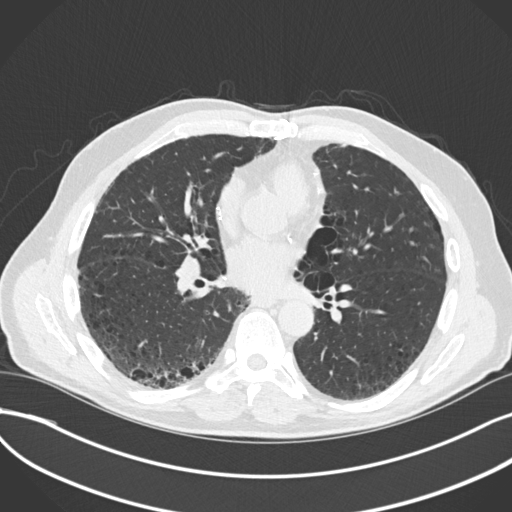
[im 89/166  lung]
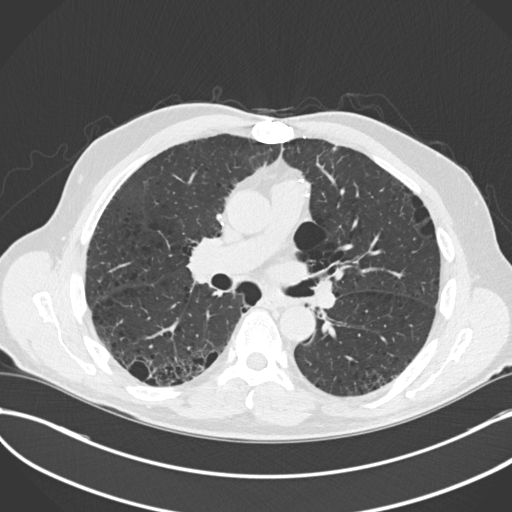
[im 102/166  lung]
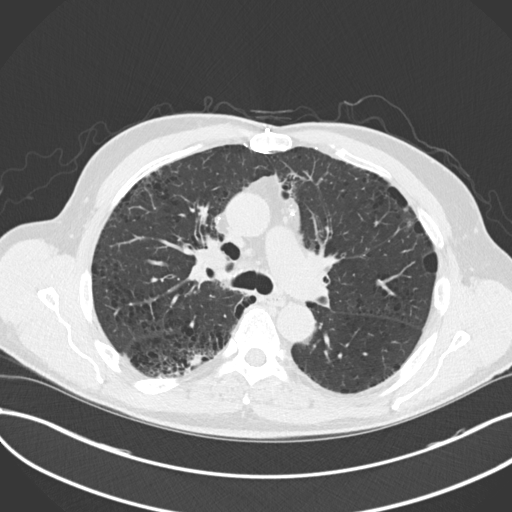
[im 115/166  mediastinal]
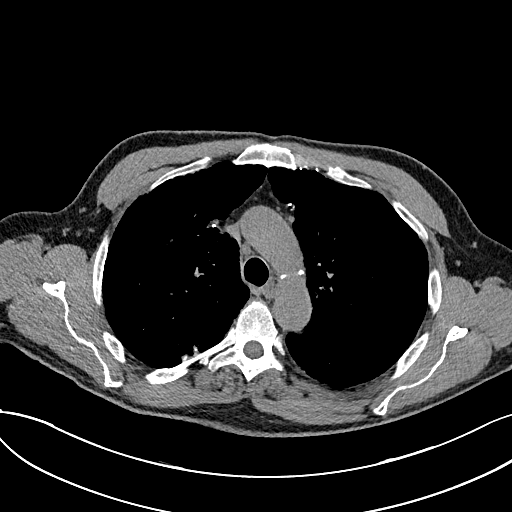
[im 115/166  lung]
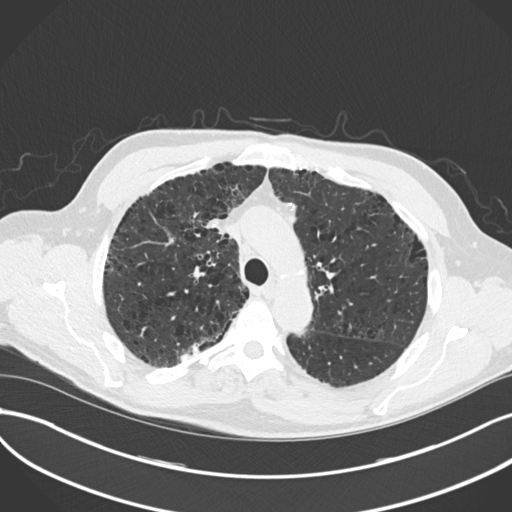
[im 127/166  lung]
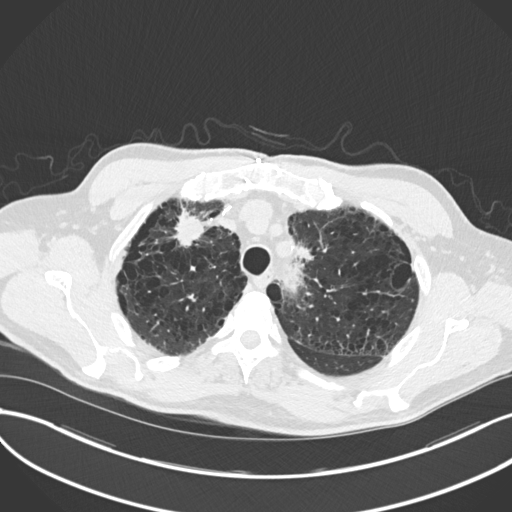
[im 140/166  lung]
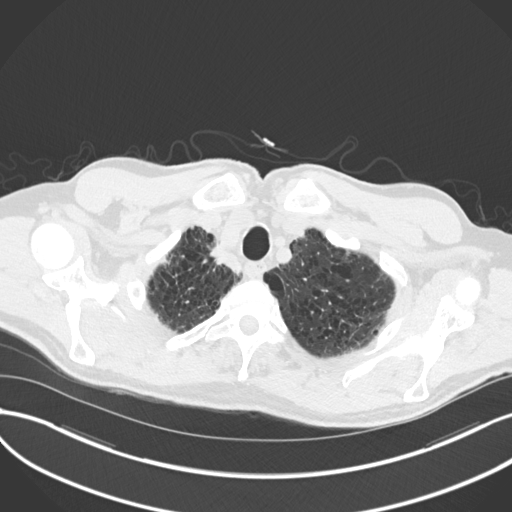
[im 153/166  lung]
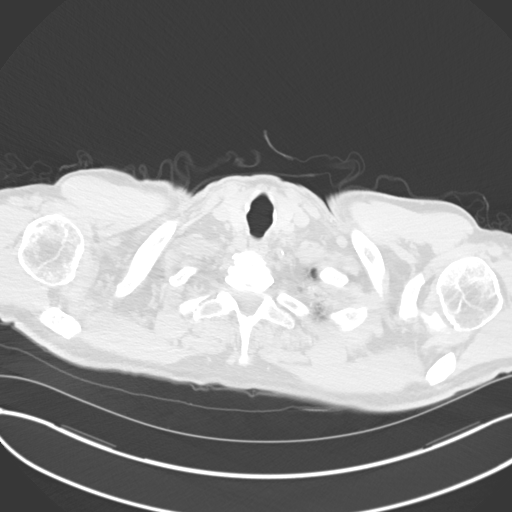

[Series 5: coronal · coronal · 0.65mm/px · 3 of 121 slices shown]
[im 25/121  lung]
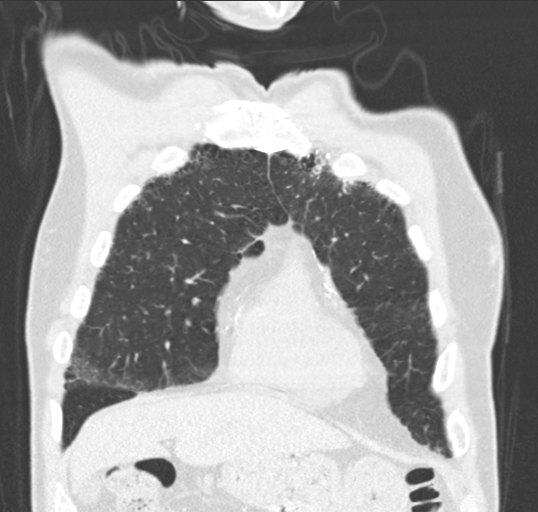
[im 49/121  lung]
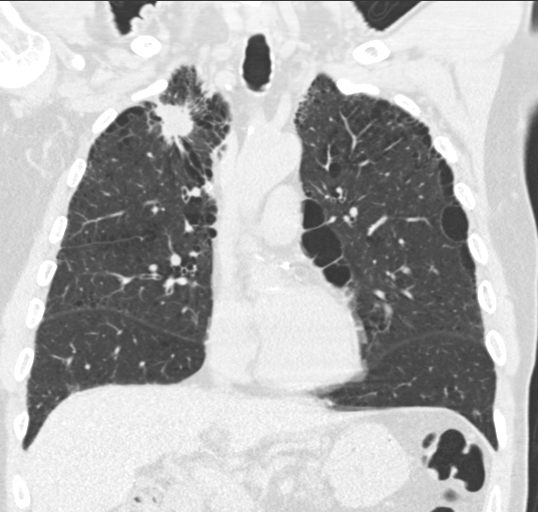
[im 73/121  lung]
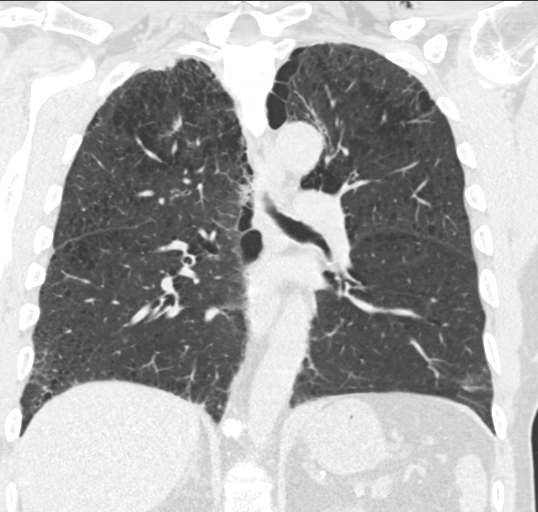

[15 of 36 positions shown; findings below may reference images not displayed]

FINDINGS: Cardiovascular: Coronary artery calcifications are seen. There is
evidence of previous coronary bypass surgery.

Mediastinum/Nodes: There are slightly enlarged lymph nodes in the
mediastinum. There is 1.5 x 0.9 cm node in the precarinal region.

Lungs/Pleura: There is 2.4 x 2 by 2.8 cm noncalcified nodule in the
anterior right upper lobe in the image 40 of series 3. There is
spiculation in the margins of this lesion. In image 26, 2.5 x 1.1 cm
nodule in the medial right upper lobe with spiculated margins. In
image 36 of series 3, there is 1.8 x 1.4 x 1.7 cm nodule in the
medial left upper lobe with spiculated margins. Severe centrilobular
and panlobular emphysema is seen. There are numerous blebs and
bullae in the periphery of both lungs. In image 63, there is 1.2 x
0.7 cm nodular density in the posterior right mid lung fields most
likely in the superior segment of right lower lobe. There is no
focal pulmonary consolidation. There is no pleural effusion or
pneumothorax.

Upper Abdomen: Unremarkable.

Musculoskeletal: Unremarkable.
IMPRESSION: There is 2.8 cm noncalcified nodule with spiculated margins. This
lesion has to be considered primary malignant neoplasm such as lung
carcinoma until proven otherwise. Follow-up PET-CT and biopsy may be
considered.

There are other noncalcified nodules with spiculated margins in both
upper lobes and superior segment of right lower lobe which may also
suggest neoplastic process.

There are few enlarged lymph nodes in the mediastinum which may
suggest benign reactive hyperplasia or metastatic lymphadenopathy.

Severe COPD.  Coronary artery disease.

## 2024-06-07 ENCOUNTER — Inpatient Hospital Stay
